# Patient Record
Sex: Male | Born: 1935 | Race: White | Hispanic: No | Smoking: Never smoker
Health system: Southern US, Community
[De-identification: ages and names within clinical notes are randomized; demographics above are authoritative.]

## PROBLEM LIST (undated history)

## (undated) DIAGNOSIS — C679 Malignant neoplasm of bladder, unspecified: Secondary | ICD-10-CM

## (undated) DIAGNOSIS — N189 Chronic kidney disease, unspecified: Secondary | ICD-10-CM

## (undated) DIAGNOSIS — Z860100 Personal history of colon polyps, unspecified: Secondary | ICD-10-CM

## (undated) DIAGNOSIS — R319 Hematuria, unspecified: Secondary | ICD-10-CM

## (undated) DIAGNOSIS — K219 Gastro-esophageal reflux disease without esophagitis: Secondary | ICD-10-CM

## (undated) DIAGNOSIS — E039 Hypothyroidism, unspecified: Secondary | ICD-10-CM

## (undated) DIAGNOSIS — H9190 Unspecified hearing loss, unspecified ear: Secondary | ICD-10-CM

## (undated) DIAGNOSIS — Z87442 Personal history of urinary calculi: Secondary | ICD-10-CM

## (undated) DIAGNOSIS — R5383 Other fatigue: Secondary | ICD-10-CM

## (undated) DIAGNOSIS — R350 Frequency of micturition: Secondary | ICD-10-CM

## (undated) DIAGNOSIS — Z8719 Personal history of other diseases of the digestive system: Secondary | ICD-10-CM

## (undated) DIAGNOSIS — I10 Essential (primary) hypertension: Secondary | ICD-10-CM

## (undated) DIAGNOSIS — N400 Enlarged prostate without lower urinary tract symptoms: Secondary | ICD-10-CM

## (undated) DIAGNOSIS — R29898 Other symptoms and signs involving the musculoskeletal system: Secondary | ICD-10-CM

## (undated) DIAGNOSIS — N183 Chronic kidney disease, stage 3 unspecified: Secondary | ICD-10-CM

## (undated) DIAGNOSIS — Z973 Presence of spectacles and contact lenses: Secondary | ICD-10-CM

## (undated) DIAGNOSIS — R32 Unspecified urinary incontinence: Secondary | ICD-10-CM

## (undated) DIAGNOSIS — R0600 Dyspnea, unspecified: Secondary | ICD-10-CM

## (undated) DIAGNOSIS — R972 Elevated prostate specific antigen [PSA]: Secondary | ICD-10-CM

## (undated) DIAGNOSIS — D649 Anemia, unspecified: Secondary | ICD-10-CM

## (undated) DIAGNOSIS — Z9889 Other specified postprocedural states: Secondary | ICD-10-CM

## (undated) DIAGNOSIS — R7303 Prediabetes: Secondary | ICD-10-CM

## (undated) DIAGNOSIS — G4730 Sleep apnea, unspecified: Secondary | ICD-10-CM

## (undated) DIAGNOSIS — M1990 Unspecified osteoarthritis, unspecified site: Secondary | ICD-10-CM

## (undated) DIAGNOSIS — E785 Hyperlipidemia, unspecified: Secondary | ICD-10-CM

## (undated) DIAGNOSIS — M7550 Bursitis of unspecified shoulder: Secondary | ICD-10-CM

## (undated) DIAGNOSIS — J45909 Unspecified asthma, uncomplicated: Secondary | ICD-10-CM

## (undated) HISTORY — PX: OTHER SURGICAL HISTORY: SHX169

## (undated) HISTORY — DX: Hypothyroidism, unspecified: E03.9

## (undated) HISTORY — DX: Gastro-esophageal reflux disease without esophagitis: K21.9

## (undated) HISTORY — PX: TRANSURETHRAL RESECTION OF BLADDER TUMOR: SHX2575

## (undated) HISTORY — PX: CATARACT EXTRACTION, BILATERAL: SHX1313

## (undated) HISTORY — DX: Essential (primary) hypertension: I10

## (undated) HISTORY — PX: UPPER GI ENDOSCOPY: SHX6162

## (undated) HISTORY — DX: Hyperlipidemia, unspecified: E78.5

---
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
## 1998-11-23 HISTORY — PX: PROSTATE BIOPSY: SHX241

## 1999-06-12 ENCOUNTER — Other Ambulatory Visit: Admission: RE | Admit: 1999-06-12 | Discharge: 1999-06-12 | Payer: Self-pay | Admitting: Urology

## 2002-01-03 ENCOUNTER — Ambulatory Visit (HOSPITAL_COMMUNITY): Admission: RE | Admit: 2002-01-03 | Discharge: 2002-01-03 | Payer: Self-pay | Admitting: *Deleted

## 2002-01-03 ENCOUNTER — Encounter (INDEPENDENT_AMBULATORY_CARE_PROVIDER_SITE_OTHER): Payer: Self-pay | Admitting: Specialist

## 2003-01-17 ENCOUNTER — Emergency Department (HOSPITAL_COMMUNITY): Admission: EM | Admit: 2003-01-17 | Discharge: 2003-01-18 | Payer: Self-pay

## 2003-01-23 ENCOUNTER — Ambulatory Visit (HOSPITAL_COMMUNITY): Admission: RE | Admit: 2003-01-23 | Discharge: 2003-01-23 | Payer: Self-pay | Admitting: Endocrinology

## 2003-01-23 ENCOUNTER — Encounter: Payer: Self-pay | Admitting: Endocrinology

## 2003-04-06 ENCOUNTER — Encounter (INDEPENDENT_AMBULATORY_CARE_PROVIDER_SITE_OTHER): Payer: Self-pay | Admitting: Specialist

## 2003-04-06 ENCOUNTER — Ambulatory Visit (HOSPITAL_COMMUNITY): Admission: RE | Admit: 2003-04-06 | Discharge: 2003-04-06 | Payer: Self-pay | Admitting: *Deleted

## 2005-03-27 ENCOUNTER — Ambulatory Visit (HOSPITAL_COMMUNITY): Admission: RE | Admit: 2005-03-27 | Discharge: 2005-03-27 | Payer: Self-pay | Admitting: *Deleted

## 2005-03-27 ENCOUNTER — Encounter (INDEPENDENT_AMBULATORY_CARE_PROVIDER_SITE_OTHER): Payer: Self-pay | Admitting: Specialist

## 2007-05-06 ENCOUNTER — Ambulatory Visit (HOSPITAL_COMMUNITY): Admission: RE | Admit: 2007-05-06 | Discharge: 2007-05-06 | Payer: Self-pay | Admitting: *Deleted

## 2007-05-06 ENCOUNTER — Encounter (INDEPENDENT_AMBULATORY_CARE_PROVIDER_SITE_OTHER): Payer: Self-pay | Admitting: *Deleted

## 2009-11-23 HISTORY — PX: TRANSURETHRAL RESECTION OF PROSTATE: SHX73

## 2010-03-17 ENCOUNTER — Encounter (INDEPENDENT_AMBULATORY_CARE_PROVIDER_SITE_OTHER): Payer: Self-pay | Admitting: *Deleted

## 2010-03-19 ENCOUNTER — Ambulatory Visit: Payer: Self-pay | Admitting: Gastroenterology

## 2010-04-02 ENCOUNTER — Ambulatory Visit: Payer: Self-pay | Admitting: Gastroenterology

## 2010-12-23 NOTE — Letter (Signed)
Summary: [facility] Instructions  [facility]  [place], [place] [address]   Phone: [phone]  Fax: [phone]       [name] [name]    1932-08-24    MRN: [id number]        Procedure Day Sudie Grumbling:  Wednesday 04/02/2010     Arrival Time: 9:00 am      Procedure Time: 10:00 am     Location of Procedure:                    _x _  [facility] (4th Floor)                        [place]   Starting 5 days prior to your procedure Friday 5/6 do not eat nuts, seeds, popcorn, corn, beans, peas,  salads, or any raw vegetables.  Do not take any fiber supplements (e.g. Metamucil, Citrucel, and Benefiber).  THE DAY BEFORE YOUR PROCEDURE         DATE: Tuesday 5/10  1.  Drink clear liquids the entire day-NO SOLID FOOD  2.  Do not drink anything colored red or purple.  Avoid juices with pulp.  No orange juice.  3.  Drink at least 64 oz. (8 glasses) of fluid/clear liquids during the day to prevent dehydration and help the prep work efficiently.  CLEAR LIQUIDS INCLUDE: Water Jello Ice Popsicles Tea (sugar ok, no milk/cream) Powdered fruit flavored drinks Coffee (sugar ok, no milk/cream) Gatorade Juice: apple, white grape, white cranberry  Lemonade Clear bullion, consomm, broth Carbonated beverages (any kind) Strained chicken noodle soup Hard Candy                             4.  In the morning, mix first dose of MoviPrep solution:    Empty 1 Pouch A and 1 Pouch B into the disposable container    Add lukewarm drinking water to the top line of the container. Mix to dissolve    Refrigerate (mixed solution should be used within 24 hrs)  5.  Begin drinking the prep at 5:00 p.m. The MoviPrep container is divided by 4 marks.   Every 15 minutes drink the solution down to the next mark (approximately 8 oz) until the full liter is complete.   6.  Follow completed prep with 16 oz of clear liquid of your choice  (Nothing red or purple).  Continue to drink clear liquids until bedtime.  7.  Before going to bed, mix second dose of MoviPrep solution:    Empty 1 Pouch A and 1 Pouch B into the disposable container    Add lukewarm drinking water to the top line of the container. Mix to dissolve    Refrigerate  THE DAY OF YOUR PROCEDURE      DATE: Wednesday 5/11  Beginning at 5:00 a.m. (5 hours before procedure):         1. Every 15 minutes, drink the solution down to the next mark (approx 8 oz) until the full liter is complete.  2. Follow completed prep with 16 oz. of clear liquid of your choice.    3. You may drink clear liquids until 8:00 am (2 HOURS BEFORE PROCEDURE).   MEDICATION INSTRUCTIONS  Unless otherwise instructed, you should take regular prescription medications with a small sip of water   as early as possible the morning of  your procedure.    Additional medication instructions:  Do not take Diovan/HCT day of procedure.         OTHER INSTRUCTIONS  You will need a responsible adult at least 75 years of age to accompany you and drive you home.   This person must remain in the waiting room during your procedure.  Wear loose fitting clothing that is easily removed.  Leave jewelry and other valuables at home.  However, you may wish to bring a book to read or  an iPod/MP3 player to listen to music as you wait for your procedure to start.  Remove all body piercing jewelry and leave at home.  Total time from sign-in until discharge is approximately 2-3 hours.  You should go home directly after your procedure and rest.  You can resume normal activities the  day after your procedure.  The day of your procedure you should not:   Drive   Make legal decisions   Operate machinery   Drink alcohol   Return to work  You will receive specific instructions about eating, activities and medications before you leave.    The above instructions have been reviewed and explained  to me by  Ulice Dash RN  March 19, 2010 11:29 AM    I fully understand and can verbalize these instructions _____________________________ Date _________

## 2010-12-23 NOTE — Procedures (Signed)
Summary: Colonoscopy  Patient: [name] [name] Note: All result statuses are Final unless otherwise noted.  Tests: (1) Colonoscopy (COL)   COL Colonoscopy           [facility]     [place], [place]  [address]           COLONOSCOPY PROCEDURE REPORT           PATIENT:  [name], [name]  MR#:  [id number]     BIRTHDATE:  1932-04-29, 77 yrs. old  GENDER:  male     ENDOSCOPIST:  Milus Banister, MD     REF. BY:  Tommy Medal, M.D.     PROCEDURE DATE:  04/02/2010     PROCEDURE:  Diagnostic Colonoscopy     ASA CLASS:  Class II     INDICATIONS:  previous TA removed by Dr. Lajoyce Corners in 2008, was     recommended to have repeat colonoscopy around now     MEDICATIONS:  Fentanyl 75 mcg IV, Versed 6 mg IV           DESCRIPTION OF PROCEDURE:   After the risks benefits and     alternatives of the procedure were thoroughly explained, informed     consent was obtained.  Digital rectal exam was performed and     revealed no rectal masses.   The LB CF-H180AL [id number] endoscope     was introduced through the anus and advanced to the cecum, which     was identified by both the appendix and ileocecal valve, without     limitations.  The quality of the prep was good, using MoviPrep.     The instrument was then slowly withdrawn as the colon was fully     examined.     <<PROCEDUREIMAGES>>           FINDINGS:  Mild diverticulosis was found in the sigmoid to     descending colon segments (see image3).  This was otherwise a     normal examination of the colon (see image4, image2, and image1).     Retroflexed views in the rectum revealed no abnormalities.    The     scope was then withdrawn from the patient and the procedure     completed.           COMPLICATIONS:  None     ENDOSCOPIC IMPRESSION:     1) Mild diverticulosis in the sigmoid to descending colon     segments     2) Otherwise normal examination; no polyps or cancers           RECOMMENDATIONS:     1)  Given your personal history of pre-cancerous polyps, you     should have a repeat colonoscopy in 5 years           REPEAT EXAM:  5 years           ______________________________     Milus Banister, MD           n.     eSIGNED:   Milus Banister at 04/02/2010 10:12 AM           [place], [place], [id number]  Note: An exclamation mark (!) indicates a result that was not dispersed into the flowsheet. Document Creation Date: 04/02/2010 10:12 AM _______________________________________________________________________  (1) Order result status: Final Collection or observation date-time: 04/02/2010 10:06 Requested date-time:  Receipt date-time:  Reported date-time:  Referring Physician:   Ordering Physician: Owens Loffler [phone]) Specimen Source:  Source: Tawanna Cooler Order Number: [id number] Lab site:   Appended Document: Colonoscopy    Clinical Lists Changes  Observations: Added new observation of COLONNXTDUE: 03/2015 (04/02/2010 14:41)

## 2010-12-23 NOTE — Miscellaneous (Signed)
Summary: [facility]  Clinical Lists Changes  Medications: Added new medication of MOVIPREP 100 GM  SOLR (PEG-KCL-NACL-NASULF-NA ASC-C) As per prep instructions. - Signed Rx of MOVIPREP 100 GM  SOLR (PEG-KCL-NACL-NASULF-NA ASC-C) As per prep instructions.;  #1 x 0;  Signed;  Entered by: Ulice Dash RN;  Authorized by: Milus Banister MD;  Method used: Electronically to [facility]*, [address], [place], [place], [place]  [address], Ph: [phone], Fax: [phone] Allergies: Added new allergy or adverse reaction of CIPRO Observations: Added new observation of NKA: F (03/19/2010 10:49)    Prescriptions: MOVIPREP 100 GM  SOLR (PEG-KCL-NACL-NASULF-NA ASC-C) As per prep instructions.  #1 x 0   Entered by:   Ulice Dash RN   Authorized by:   Milus Banister MD   Signed by:   Ulice Dash RN on 03/19/2010   Method used:   Electronically to        [facility]* (retail)       [address] [place], [place]  [address]       Ph: [phone]       Fax: [phone]   RxID:   [id number]

## 2011-04-07 NOTE — Op Note (Signed)
NAMEARRIE, [name] [name] NO.:  [id number]   MEDICAL RECORD NO.:  [id number]          PATIENT TYPE:  AMB   LOCATION:  ENDO                         FACILITY:  [facility]   PHYSICIAN:  Waverly Ferrari, M.D.    DATE OF BIRTH:  August 15, 1932   DATE OF PROCEDURE:  05/06/2007  DATE OF DISCHARGE:                               OPERATIVE REPORT   PROCEDURE:  Colonoscopy.   INDICATIONS:  Colon polyp.   ANESTHESIA:  Fentanyl 40 mcg, Versed 4 mg.   DESCRIPTION OF PROCEDURE:  With the patient mildly sedated in the left  lateral decubitus position, the Pentax videoscopic colonoscope was  inserted in the rectum and passed under direct vision. With pressure  applied, we reached the cecum identified by the ileocecal valve and  appendiceal orifice, both of which were photographed. From this point,  the colonoscope was slowly withdrawn taking circumferential views of the  colonic mucosa stopping two folds removed from the ileocecal valve at  which point we encountered a small polyp that was removed using hot  biopsy forceps technique, setting 20/200 blended current.  We next  stopped in the rectum which appeared normal on direct and retroflexed  view. The endoscope was straightened and withdrawn.  The patient's vital  signs and pulse oximeter remained stable.  The patient tolerated the  procedure well without apparent complications.   FINDINGS:  Scattered diverticula seen throughout the colon.  A small  polyp of ascending colon removed. Otherwise, an unremarkable exam.   PLAN:  Await biopsy report.  The patient will call me for results and  follow-up with me as needed as an outpatient.           ______________________________  Waverly Ferrari, M.D.     GMO/MEDQ  D:  05/06/2007  T:  05/06/2007  Job:  [id number]

## 2011-04-07 NOTE — Op Note (Signed)
NAMEMARLEN, [name] [name] NO.:  [id number]   MEDICAL RECORD NO.:  [id number]          PATIENT TYPE:  AMB   LOCATION:  ENDO                         FACILITY:  [facility]   PHYSICIAN:  Waverly Ferrari, M.D.    DATE OF BIRTH:  August 30, 1932   DATE OF PROCEDURE:  05/06/2007  DATE OF DISCHARGE:                               OPERATIVE REPORT   PROCEDURE:  Upper endoscopy.   INDICATIONS:  Gastroesophageal reflux disease.   ANESTHESIA:  Fentanyl 60 mcg, Versed 6 mg.   DESCRIPTION OF PROCEDURE:  With the patient mildly sedated in the left  lateral decubitus position, the Pentax videoscopic endoscope was  inserted in the mouth and passed under direct vision through the  esophagus which appeared normal.  There was no evidence of Barrett's  esophagus.  A small hiatal hernia was noted.  We did a biopsy around the  squamocolumnar junction to rule out esophagitis.  We entered into the  stomach.  The fundus, body, antrum, duodenal bulb, and second portion of  the duodenum appeared normal. From this point, the endoscope was slowly  withdrawn taking circumferential views of the duodenal mucosa until the  endoscope had been pulled back in the stomach and placed in retroflexion  to view the stomach from below. The endoscope was straightened and  withdrawn taking circumferential views of the remaining gastric and  esophageal mucosa.  The patient's vital signs and pulse oximeter  remained stable.  The patient tolerated the procedure well without  apparent complications.   FINDINGS:  Loose wrap of the GE junction around the endoscope indicating  laxity of the lower esophageal sphincter and a small hiatal hernia was  noted, as well. Otherwise, unremarkable exam.   PLAN:  Await biopsy report.  The patient will call me for results and  follow-up with me as an outpatient as needed.           ______________________________  Waverly Ferrari, M.D.     GMO/MEDQ  D:  05/06/2007  T:  05/06/2007   Job:  [id number]

## 2011-04-10 NOTE — Procedures (Signed)
[place]  Patient:    LECIL, MOFFA Visit Number: [id number] MRN: [id number]          Service Type: END Location: ENDO Attending Physician:  Jim Desanctis Dictated by:   Jim Desanctis, M.D. Admit Date:  01/03/2002                             Procedure Report  PROCEDURE:  Colonoscopy.  INDICATION:  Colon cancer screening.  ANESTHESIA:  Demerol 50 mg, Versed 5 mg.  DESCRIPTION OF PROCEDURE:  With patient mildly sedated in the left lateral decubitus position, the Olympus videoscopic colonoscope was inserted into the rectum after a normal rectal exam and passed under direct vision to the cecum. Cecum identified by the ileocecal valve and appendiceal orifice, both of which were photographed.  From this point the colonoscope was slowly withdrawn, taking circumferential views of the entire colonic mucosa, stopping only in the rectum, which appeared normal on retroflex view.  The endoscope was straightened and withdrawn.  The patients vital signs and pulse oximetry remained stable.  The patient tolerated the procedure well without apparent complications.  FINDINGS:  Essentially normal colonoscopic examination.  PLAN:  See endoscopy note for further details. Dictated by:   Jim Desanctis, M.D. Attending Physician:  Jim Desanctis DD:  01/03/02 TD:  01/03/02 Job: [id number]

## 2011-04-10 NOTE — Op Note (Signed)
   [name] [name], [name] [name]                       ACCOUNT NO.:  [id number]   MEDICAL RECORD NO.:  [id number]                   PATIENT TYPE:  AMB   LOCATION:  ENDO                                 FACILITY:  [facility]   PHYSICIAN:  Waverly Ferrari, M.D.                 DATE OF BIRTH:  06/19/1932   DATE OF PROCEDURE:  04/06/2003  DATE OF DISCHARGE:                                 OPERATIVE REPORT   PROCEDURE:  Upper endoscopy with biopsy and dilation.   INDICATIONS:  Endoscopic Barrett's esophagus with dysphagia.   ANESTHESIA:  1. Demerol 60 mg.  2. Versed 5 mg.   DESCRIPTION OF PROCEDURE:  With patient mildly sedated in the left lateral  decubitus position, the Olympus videoscopic endoscope was inserted in the  mouth, passed under direct vision through the esophagus, which appeared  normal until the distal esophagus, and there was a question of a stricture  and also areas that appeared to be compatible with Barrett's esophagus.  Photographs were taken.  Biopsies were taken of the Barrett's areas.  We  advanced the endoscope into the stomach.  Fundus, body, antrum, duodenal  bulb, and second portion of duodenum appeared normal.  From this point, the  endoscope was slowly withdrawn, taking circumferential views of the duodenal  mucosa until the endoscope then pulled back into the stomach, placed in  retroflexion to view the stomach from below, and a hiatal hernia was seen  and photographed.  The endoscope was then straightened, and a guidewire was  passed.  The endoscope was withdrawn.  Subsequently, dilators 15 and 18 were  passed over the guidewire easily.  There was trace blood on each dilator,  but biopsy had already been taken.  With the latter, the guidewire was  removed, endoscope reinserted.  There was a minimal amount of bleeding seen  but nothing active in the esophagus and none in the stomach.  The endoscope  was withdrawn.  The patient's vital signs and pulse oximetry remained  stable.  The patient tolerated the procedure well without apparent  complications.   FINDINGS:  1. Changes of Barrett's esophagus, biopsied.  2. Hiatal hernia.  3. Esophageal dilation to 15 and 18 Savary.   PLAN:  1. Await clinical response.  2. The patient will call me for results of biopsies and follow up with me as     an outpatient.                                               Waverly Ferrari, M.D.    GMO/MEDQ  D:  04/06/2003  T:  04/06/2003  Job:  [id number]

## 2011-04-10 NOTE — Procedures (Signed)
. [facility]  Patient:    [name] [name], [name] [name] Visit Number: [id number] MRN: [id number]          Service Type: END Location: ENDO Attending Physician:  Jim Desanctis Dictated by:   Jim Desanctis, M.D. Proc. Date: 01/03/02 Admit Date:  01/03/2002                             Procedure Report  PROCEDURE PERFORMED:  Upper endoscopy.  ENDOSCOPIST:  Jim Desanctis, M.D.  INDICATIONS FOR PROCEDURE:  Gastroesophageal reflux disease.  ANESTHESIA:  Demerol 70 mg, Versed 7 mg  DESCRIPTION OF PROCEDURE:  With the patient mildly sedated in the left lateral decubitus position, the Olympus video endoscope was inserted in the mouth and passed under direct vision through the esophagus.  We photographed the distal esophagus.  We entered into the stomach.  The fundus, body, antrum, duodenal bulb and second portion of the duodenum were all well-visualized.  Photographs were taken.  From this point, the endoscope was slowly withdrawn taking circumferential views of the entire duodenal mucosa until the endoscope had been pulled back into the stomach and placed on retroflexion to view the stomach from below and a hiatal hernia was seen from below and photographed. The endoscope was then straightened and withdrawn taking circumferential views of the remaining gastric mucosa  stopping in the distal esophagus where on second view of this area it appeared that there may have been an area of Barretts esophagus which we photographed and then biopsied.  Once accomplished the endoscope was withdrawn taking circumferential views of the remaining esophageal mucosa.  Patients vital signs and pulse oximeter remained stable.  The patient tolerated the procedure well without apparent complications.  FINDINGS:  Hiatal hernia with questionable Barretts esophagus, biopsied, await biopsy report.  Patient will call me for results and follow up with me as an outpatient.  Proceed to colonoscopy  as planned.  PLAN: Dictated by:   Jim Desanctis, M.D. Attending Physician:  Jim Desanctis DD:  01/03/02 TD:  01/03/02 Job: [id number]

## 2011-04-10 NOTE — Op Note (Signed)
NAMEBRANDOM, SCIARRINO NO.:  [id number]   MEDICAL RECORD NO.:  [id number]          PATIENT TYPE:  AMB   LOCATION:  ENDO                         FACILITY:  [facility]   PHYSICIAN:  Waverly Ferrari, M.D.    DATE OF BIRTH:  June 02, 1932   DATE OF PROCEDURE:  03/27/2005  DATE OF DISCHARGE:                                 OPERATIVE REPORT   PROCEDURE:  Upper endoscopy with biopsy.   INDICATIONS:  Endoscopic Barrett's esophagus with gastroesophageal reflux  disease.   ANESTHESIA:  Demerol 50, Versed 7 mg.   PROCEDURE:  With the patient mildly sedated in the left lateral decubitus  position,  the Olympus videoscopic endoscope was inserted in the mouth and  passed under direct vision through the esophagus which appeared normal until  we reached the distal esophagus and there was a question of some areas of  Barrett's extending cephalad into the esophagus which were photographed and  biopsied in these areas. We then entered into the stomach. The fundus, body,  antrum, duodenal bulb, and second portion duodenum were visualized. From  this point, the endoscope was slowly withdrawn taking circumferential views  of the duodenal mucosa until the endoscope had been pulled back into the  stomach, placed in retroflexion to view the stomach from below. The  endoscope was then straightened and withdrawn taking circumferential views  of the remaining gastric and esophageal mucosa stopping to biopsy the polyp  seen in the fundus. The patient's vital signs and pulse oximeter remained  stable. The patient tolerated procedure well without apparent complications.   FINDINGS:  Fundic polyps biopsied, question of Barrett's biopsied, await  biopsy reports. The patient will call me for results and follow-up with me  as an outpatient.      GMO/MEDQ  D:  03/27/2005  T:  03/27/2005  Job:  [id number]

## 2011-05-01 ENCOUNTER — Other Ambulatory Visit: Payer: Self-pay | Admitting: Internal Medicine

## 2011-05-01 DIAGNOSIS — M25562 Pain in left knee: Secondary | ICD-10-CM

## 2011-05-02 ENCOUNTER — Ambulatory Visit
Admission: RE | Admit: 2011-05-02 | Discharge: 2011-05-02 | Disposition: A | Discharge status: Home / Self Care | Payer: Medicare Other | Source: Ambulatory Visit | Attending: Internal Medicine | Admitting: Internal Medicine

## 2011-05-02 DIAGNOSIS — M25562 Pain in left knee: Secondary | ICD-10-CM

## 2011-06-10 HISTORY — PX: KNEE ARTHROSCOPY: SUR90

## 2011-06-12 ENCOUNTER — Emergency Department (HOSPITAL_COMMUNITY)
Admission: EM | Admit: 2011-06-12 | Discharge: 2011-06-13 | Disposition: A | Discharge status: Home / Self Care | Payer: Medicare Other | Attending: Emergency Medicine | Admitting: Emergency Medicine

## 2011-06-12 ENCOUNTER — Emergency Department (HOSPITAL_COMMUNITY): Payer: Medicare Other

## 2011-06-12 DIAGNOSIS — I10 Essential (primary) hypertension: Secondary | ICD-10-CM | POA: Insufficient documentation

## 2011-06-12 DIAGNOSIS — H547 Unspecified visual loss: Secondary | ICD-10-CM | POA: Insufficient documentation

## 2011-06-12 DIAGNOSIS — E7800 Pure hypercholesterolemia, unspecified: Secondary | ICD-10-CM | POA: Insufficient documentation

## 2011-06-12 LAB — CBC
HCT: 38.2 % — ABNORMAL LOW (ref 39.0–52.0)
Hemoglobin: 13.3 g/dL (ref 13.0–17.0)
MCH: 30.4 pg (ref 26.0–34.0)
MCHC: 34.8 g/dL (ref 30.0–36.0)
MCV: 87.4 fL (ref 78.0–100.0)
Platelets: 228 10*3/uL (ref 150–400)
RBC: 4.37 MIL/uL (ref 4.22–5.81)
RDW: 13.2 % (ref 11.5–15.5)
WBC: 9.5 10*3/uL (ref 4.0–10.5)

## 2011-06-12 LAB — BASIC METABOLIC PANEL
BUN: 31 mg/dL — ABNORMAL HIGH (ref 6–23)
CO2: 26 mEq/L (ref 19–32)
Calcium: 8.8 mg/dL (ref 8.4–10.5)
Chloride: 101 mEq/L (ref 96–112)
Creatinine, Ser: 1.67 mg/dL — ABNORMAL HIGH (ref 0.50–1.35)
GFR calc Af Amer: 49 mL/min — ABNORMAL LOW (ref >60)
GFR calc non Af Amer: 40 mL/min — ABNORMAL LOW (ref >60)
Glucose, Bld: 100 mg/dL — ABNORMAL HIGH (ref 70–99)
Potassium: 4.1 mEq/L (ref 3.5–5.1)
Sodium: 137 mEq/L (ref 135–145)

## 2011-06-12 LAB — DIFFERENTIAL
Basophils Absolute: 0 10*3/uL (ref 0.0–0.1)
Basophils Relative: 0 % (ref 0–1)
Eosinophils Absolute: 0.2 10*3/uL (ref 0.0–0.7)
Eosinophils Relative: 2 % (ref 0–5)
Lymphocytes Relative: 15 % (ref 12–46)
Lymphs Abs: 1.4 10*3/uL (ref 0.7–4.0)
Monocytes Absolute: 0.6 10*3/uL (ref 0.1–1.0)
Monocytes Relative: 6 % (ref 3–12)
Neutro Abs: 7.4 10*3/uL (ref 1.7–7.7)
Neutrophils Relative %: 77 % (ref 43–77)

## 2011-06-12 LAB — PROTIME-INR
INR: 1.02 (ref 0.00–1.49)
Prothrombin Time: 13.6 seconds (ref 11.6–15.2)

## 2012-10-12 ENCOUNTER — Other Ambulatory Visit: Payer: Self-pay | Admitting: Internal Medicine

## 2012-10-12 DIAGNOSIS — N289 Disorder of kidney and ureter, unspecified: Secondary | ICD-10-CM

## 2012-10-14 ENCOUNTER — Ambulatory Visit
Admission: RE | Admit: 2012-10-14 | Discharge: 2012-10-14 | Disposition: A | Discharge status: Home / Self Care | Payer: Medicare Other | Source: Ambulatory Visit | Attending: Internal Medicine | Admitting: Internal Medicine

## 2012-10-14 DIAGNOSIS — N289 Disorder of kidney and ureter, unspecified: Secondary | ICD-10-CM

## 2012-11-28 DIAGNOSIS — K219 Gastro-esophageal reflux disease without esophagitis: Secondary | ICD-10-CM | POA: Diagnosis present

## 2012-12-09 DIAGNOSIS — N400 Enlarged prostate without lower urinary tract symptoms: Secondary | ICD-10-CM | POA: Insufficient documentation

## 2014-03-01 ENCOUNTER — Ambulatory Visit
Admission: RE | Admit: 2014-03-01 | Discharge: 2014-03-01 | Disposition: A | Discharge status: Home / Self Care | Payer: Medicare Other | Source: Ambulatory Visit | Attending: Internal Medicine | Admitting: Internal Medicine

## 2014-03-01 ENCOUNTER — Other Ambulatory Visit: Payer: Self-pay | Admitting: Internal Medicine

## 2014-03-01 DIAGNOSIS — M545 Low back pain, unspecified: Secondary | ICD-10-CM

## 2014-07-25 ENCOUNTER — Encounter: Payer: Self-pay | Admitting: Gastroenterology

## 2014-12-26 DIAGNOSIS — M7551 Bursitis of right shoulder: Secondary | ICD-10-CM | POA: Diagnosis not present

## 2014-12-26 DIAGNOSIS — M19011 Primary osteoarthritis, right shoulder: Secondary | ICD-10-CM | POA: Diagnosis not present

## 2015-01-02 DIAGNOSIS — M25511 Pain in right shoulder: Secondary | ICD-10-CM | POA: Diagnosis not present

## 2015-01-08 DIAGNOSIS — M25511 Pain in right shoulder: Secondary | ICD-10-CM | POA: Diagnosis not present

## 2015-01-10 DIAGNOSIS — M25511 Pain in right shoulder: Secondary | ICD-10-CM | POA: Diagnosis not present

## 2015-01-15 DIAGNOSIS — M25511 Pain in right shoulder: Secondary | ICD-10-CM | POA: Diagnosis not present

## 2015-01-17 DIAGNOSIS — M25511 Pain in right shoulder: Secondary | ICD-10-CM | POA: Diagnosis not present

## 2015-01-22 DIAGNOSIS — M25511 Pain in right shoulder: Secondary | ICD-10-CM | POA: Diagnosis not present

## 2015-01-24 DIAGNOSIS — M25511 Pain in right shoulder: Secondary | ICD-10-CM | POA: Diagnosis not present

## 2015-01-29 DIAGNOSIS — M25511 Pain in right shoulder: Secondary | ICD-10-CM | POA: Diagnosis not present

## 2015-01-30 DIAGNOSIS — I10 Essential (primary) hypertension: Secondary | ICD-10-CM | POA: Diagnosis not present

## 2015-01-30 DIAGNOSIS — R7309 Other abnormal glucose: Secondary | ICD-10-CM | POA: Diagnosis not present

## 2015-01-31 DIAGNOSIS — M25511 Pain in right shoulder: Secondary | ICD-10-CM | POA: Diagnosis not present

## 2015-02-04 DIAGNOSIS — E039 Hypothyroidism, unspecified: Secondary | ICD-10-CM | POA: Diagnosis not present

## 2015-02-04 DIAGNOSIS — N189 Chronic kidney disease, unspecified: Secondary | ICD-10-CM | POA: Diagnosis not present

## 2015-02-04 DIAGNOSIS — R7309 Other abnormal glucose: Secondary | ICD-10-CM | POA: Diagnosis not present

## 2015-02-04 DIAGNOSIS — E780 Pure hypercholesterolemia: Secondary | ICD-10-CM | POA: Diagnosis not present

## 2015-02-06 DIAGNOSIS — M25511 Pain in right shoulder: Secondary | ICD-10-CM | POA: Diagnosis not present

## 2015-02-08 DIAGNOSIS — M67911 Unspecified disorder of synovium and tendon, right shoulder: Secondary | ICD-10-CM | POA: Diagnosis not present

## 2015-02-26 DIAGNOSIS — R300 Dysuria: Secondary | ICD-10-CM | POA: Diagnosis not present

## 2015-03-04 DIAGNOSIS — H6123 Impacted cerumen, bilateral: Secondary | ICD-10-CM | POA: Diagnosis not present

## 2015-03-08 DIAGNOSIS — M25511 Pain in right shoulder: Secondary | ICD-10-CM | POA: Diagnosis not present

## 2015-03-08 DIAGNOSIS — M67911 Unspecified disorder of synovium and tendon, right shoulder: Secondary | ICD-10-CM | POA: Diagnosis not present

## 2015-04-15 ENCOUNTER — Encounter: Payer: Self-pay | Admitting: Gastroenterology

## 2015-04-19 DIAGNOSIS — M67911 Unspecified disorder of synovium and tendon, right shoulder: Secondary | ICD-10-CM | POA: Diagnosis not present

## 2015-05-28 DIAGNOSIS — E780 Pure hypercholesterolemia: Secondary | ICD-10-CM | POA: Diagnosis not present

## 2015-05-28 DIAGNOSIS — R7309 Other abnormal glucose: Secondary | ICD-10-CM | POA: Diagnosis not present

## 2015-05-28 DIAGNOSIS — N189 Chronic kidney disease, unspecified: Secondary | ICD-10-CM | POA: Diagnosis not present

## 2015-05-28 DIAGNOSIS — E039 Hypothyroidism, unspecified: Secondary | ICD-10-CM | POA: Diagnosis not present

## 2015-06-03 DIAGNOSIS — I10 Essential (primary) hypertension: Secondary | ICD-10-CM | POA: Diagnosis not present

## 2015-06-03 DIAGNOSIS — N3941 Urge incontinence: Secondary | ICD-10-CM | POA: Diagnosis not present

## 2015-06-03 DIAGNOSIS — E780 Pure hypercholesterolemia: Secondary | ICD-10-CM | POA: Diagnosis not present

## 2015-06-03 DIAGNOSIS — E039 Hypothyroidism, unspecified: Secondary | ICD-10-CM | POA: Diagnosis not present

## 2015-06-06 DIAGNOSIS — D1801 Hemangioma of skin and subcutaneous tissue: Secondary | ICD-10-CM | POA: Diagnosis not present

## 2015-06-06 DIAGNOSIS — L821 Other seborrheic keratosis: Secondary | ICD-10-CM | POA: Diagnosis not present

## 2015-06-06 DIAGNOSIS — L570 Actinic keratosis: Secondary | ICD-10-CM | POA: Diagnosis not present

## 2015-06-06 DIAGNOSIS — D225 Melanocytic nevi of trunk: Secondary | ICD-10-CM | POA: Diagnosis not present

## 2015-09-27 DIAGNOSIS — H2513 Age-related nuclear cataract, bilateral: Secondary | ICD-10-CM | POA: Diagnosis not present

## 2015-09-27 DIAGNOSIS — H524 Presbyopia: Secondary | ICD-10-CM | POA: Diagnosis not present

## 2015-09-27 DIAGNOSIS — H04123 Dry eye syndrome of bilateral lacrimal glands: Secondary | ICD-10-CM | POA: Diagnosis not present

## 2015-09-27 DIAGNOSIS — H25013 Cortical age-related cataract, bilateral: Secondary | ICD-10-CM | POA: Diagnosis not present

## 2015-10-02 DIAGNOSIS — N390 Urinary tract infection, site not specified: Secondary | ICD-10-CM | POA: Diagnosis not present

## 2015-10-04 DIAGNOSIS — M25551 Pain in right hip: Secondary | ICD-10-CM | POA: Diagnosis not present

## 2015-10-04 DIAGNOSIS — R350 Frequency of micturition: Secondary | ICD-10-CM | POA: Diagnosis not present

## 2015-10-04 DIAGNOSIS — Z23 Encounter for immunization: Secondary | ICD-10-CM | POA: Diagnosis not present

## 2015-10-04 DIAGNOSIS — M1990 Unspecified osteoarthritis, unspecified site: Secondary | ICD-10-CM | POA: Diagnosis not present

## 2015-10-04 DIAGNOSIS — N400 Enlarged prostate without lower urinary tract symptoms: Secondary | ICD-10-CM | POA: Diagnosis not present

## 2015-10-21 DIAGNOSIS — R7309 Other abnormal glucose: Secondary | ICD-10-CM | POA: Diagnosis not present

## 2015-10-21 DIAGNOSIS — I10 Essential (primary) hypertension: Secondary | ICD-10-CM | POA: Diagnosis not present

## 2015-10-21 DIAGNOSIS — E039 Hypothyroidism, unspecified: Secondary | ICD-10-CM | POA: Diagnosis not present

## 2015-10-21 DIAGNOSIS — N400 Enlarged prostate without lower urinary tract symptoms: Secondary | ICD-10-CM | POA: Diagnosis not present

## 2015-10-21 DIAGNOSIS — E7800 Pure hypercholesterolemia, unspecified: Secondary | ICD-10-CM | POA: Diagnosis not present

## 2015-10-28 DIAGNOSIS — E7800 Pure hypercholesterolemia, unspecified: Secondary | ICD-10-CM | POA: Diagnosis not present

## 2015-10-28 DIAGNOSIS — N183 Chronic kidney disease, stage 3 (moderate): Secondary | ICD-10-CM | POA: Diagnosis not present

## 2015-10-28 DIAGNOSIS — E039 Hypothyroidism, unspecified: Secondary | ICD-10-CM | POA: Diagnosis not present

## 2015-10-28 DIAGNOSIS — I10 Essential (primary) hypertension: Secondary | ICD-10-CM | POA: Diagnosis not present

## 2015-10-29 ENCOUNTER — Other Ambulatory Visit: Payer: Self-pay | Admitting: Internal Medicine

## 2015-10-29 DIAGNOSIS — R1310 Dysphagia, unspecified: Secondary | ICD-10-CM

## 2015-11-04 ENCOUNTER — Ambulatory Visit
Admission: RE | Admit: 2015-11-04 | Discharge: 2015-11-04 | Disposition: A | Discharge status: Home / Self Care | Payer: Self-pay | Source: Ambulatory Visit | Attending: Internal Medicine | Admitting: Internal Medicine

## 2015-11-04 DIAGNOSIS — R1310 Dysphagia, unspecified: Secondary | ICD-10-CM

## 2015-12-05 DIAGNOSIS — T148 Other injury of unspecified body region: Secondary | ICD-10-CM | POA: Diagnosis not present

## 2016-01-10 ENCOUNTER — Encounter: Payer: Self-pay | Admitting: Gastroenterology

## 2016-01-10 ENCOUNTER — Ambulatory Visit (INDEPENDENT_AMBULATORY_CARE_PROVIDER_SITE_OTHER): Payer: Medicare Other | Admitting: Gastroenterology

## 2016-01-10 VITALS — BP 144/80 | HR 88 | Ht 68.0 in | Wt 194.5 lb

## 2016-01-10 DIAGNOSIS — R1310 Dysphagia, unspecified: Secondary | ICD-10-CM | POA: Diagnosis not present

## 2016-01-10 NOTE — Patient Instructions (Addendum)
You have never been proven to have Barrett's (based on Dr. Shan Levans EGD pathology reports from 2003, 2004, 2006, 2008). Please call if you have any new, concerning GI symptoms

## 2016-01-10 NOTE — Progress Notes (Signed)
Review of pertinent gastrointestinal problems: 1. Precancerous colon polyp: Dr. Jim Desanctis colonoscopy 2008 "small" on that report, was TA. Repeat colonoscopy Dr. Ardis Hughs 2011 found no polyps, initially recommended repeat at 5 years however newer guidelines showed that to be unnecessary.   2. He does NOT have Barrett's, not sure where that history came from:  EGD Dr. Jim Desanctis 2008 specifically stated "no Barrett's noted".  Pathology from 2006, 2004, 2003 EGDs from Dr. Lajoyce Corners all showed "no intestinal metaplasia"  On at least one of those procedure notes Dr. Lajoyce Corners wrote "questionable barrett's mucosa.'  However pathology repeatedly has never proven Barrett's changes.   HPI: This is a   very pleasant 82 year old man    who was referred to me by Dr. Ashby Dawes to evaluate  pill dysphasia, question of Barrett's .    Chief complaint is pill dysphagia and I have Barrett's  He thought he was having difficulty swallowing.  Pill gets lodged, always the same small pill for thyroid.  Has voice changes intermittently. Having a lot of throat clearing, can be at night.  Never solid food dysphagia.  He's lost appetite since death of his wife, 07/12/15, 10 pounds down.  He's been on protonix for many years.    He has no pyrosis.  He has never actually been proven to have Barrett's esophagus, see the summary above  Review of systems: Pertinent positive and negative review of systems were noted in the above HPI section. Complete review of systems was performed and was otherwise normal.   Past Medical History  Diagnosis Date  . Enlarged prostate   . GERD (gastroesophageal reflux disease)   . Hypothyroidism   . HLD (hyperlipidemia)   . HTN (hypertension)   . Colon polyp     Past Surgical History  Procedure Laterality Date  . Prostate biopsy  2000  . Terp  2011, 2014    x 2  . Knee arthroscopy Left 2012  . Bladder surgery      for skin cancer    Current Outpatient Prescriptions  Medication Sig  Dispense Refill  . Ascorbic Acid (VITAMIN C) 1000 MG tablet Take 1,000 mg by mouth daily.    Marland Kitchen aspirin 81 MG tablet Take 81 mg by mouth daily.    . beta carotene 25000 UNIT capsule Take 25,000 Units by mouth daily.    Marland Kitchen BIOTIN PO Take 2,000 mcg by mouth daily.    . Cholecalciferol (HM VITAMIN D3) 4000 units CAPS Take 1 capsule by mouth daily.    . Chromium Picolinate 200 MCG CAPS Take 1 capsule by mouth daily.    . finasteride (PROSCAR) 5 MG tablet Take 1 tablet by mouth daily.    . Ginkgo Biloba (GINKOBA PO) Take 120 mg by mouth daily.    . irbesartan (AVAPRO) 300 MG tablet Take 1 tablet by mouth daily.    Marland Kitchen levothyroxine (SYNTHROID, LEVOTHROID) 50 MCG tablet Take 1 tablet by mouth daily.    . meloxicam (MOBIC) 7.5 MG tablet Take 1 tablet by mouth daily.    . Omega-3 Fatty Acids (OMEGA-3 FISH OIL PO) Take 1 tablet by mouth daily.    . pantoprazole (PROTONIX) 40 MG tablet Take 1 tablet by mouth daily.    . PSYLLIUM PO Take by mouth. 1.5 teaspoon twice a day    . rosuvastatin (CRESTOR) 20 MG tablet Take 5 mg by mouth daily.     . vitamin E 400 UNIT capsule Take 400 Units by mouth daily.  No current facility-administered medications for this visit.    Allergies as of 01/10/2016 - Review Complete 01/10/2016  Allergen Reaction Noted  . Ciprofloxacin  03/19/2010    Family History  Problem Relation Age of Onset  . Hypertension Mother   . Breast cancer Mother   . Other Mother     brain tumor  . Alzheimer's disease Mother   . Emphysema Father     smoker    Social History   Social History  . Marital Status: Married    Spouse Name: N/A  . Number of Children: 3  . Years of Education: N/A   Occupational History  . retired    Social History Main Topics  . Smoking status: Never Smoker   . Smokeless tobacco: Never Used  . Alcohol Use: 0.0 oz/week    0 Standard drinks or equivalent per week     Comment: occa beer or wine  . Drug Use: No  . Sexual Activity: Not on file    Other Topics Concern  . Not on file   Social History Narrative     Physical Exam: BP 144/80 mmHg  Pulse 88  Ht 5\' 8"  (1.727 m)  Wt 194 lb 8 oz (88.225 kg)  BMI 29.58 kg/m2 Constitutional: generally well-appearing Psychiatric: alert and oriented x3 Eyes: extraocular movements intact Mouth: oral pharynx moist, no lesions Neck: supple no lymphadenopathy Cardiovascular: heart regular rate and rhythm Lungs: clear to auscultation bilaterally Abdomen: soft, nontender, nondistended, no obvious ascites, no peritoneal signs, normal bowel sounds Extremities: no lower extremity edema bilaterally Skin: no lesions on visible extremities   Assessment and plan: 82 y.o. male with  pill only associated dysphagia, GERD, he has never had Barrett's esophagus however  He has been labeled as having Barrett's esophagus incorrectly. Multiple biopsies by previous GI provider never proved he had intestinal metaplasia. I explained this to him and he does seem to understand now that he has never had Barrett's esophagus. His dysphagia currently is pill associated only and it is actually only one very small thyroid pill. I asked him to start taking a small scoop of yogurt or applesauce with this pill to see because down easier. He will call if he is any further significant GI concerns or questions.  He does not have any food associated dysphagia and recent barium esophagram showed no esophageal strictures or stenosis. I do not think he needs upper endoscopy.    Owens Loffler, MD [facility] 01/10/2016, 2:44 PM  Cc: Dr. Ashby Dawes

## 2016-06-01 DIAGNOSIS — I10 Essential (primary) hypertension: Secondary | ICD-10-CM | POA: Diagnosis not present

## 2016-06-01 DIAGNOSIS — E7800 Pure hypercholesterolemia, unspecified: Secondary | ICD-10-CM | POA: Diagnosis not present

## 2016-06-01 DIAGNOSIS — E039 Hypothyroidism, unspecified: Secondary | ICD-10-CM | POA: Diagnosis not present

## 2016-06-01 DIAGNOSIS — N183 Chronic kidney disease, stage 3 (moderate): Secondary | ICD-10-CM | POA: Diagnosis not present

## 2016-06-08 DIAGNOSIS — R7309 Other abnormal glucose: Secondary | ICD-10-CM | POA: Diagnosis not present

## 2016-06-08 DIAGNOSIS — E039 Hypothyroidism, unspecified: Secondary | ICD-10-CM | POA: Diagnosis not present

## 2016-06-08 DIAGNOSIS — E7800 Pure hypercholesterolemia, unspecified: Secondary | ICD-10-CM | POA: Diagnosis not present

## 2016-06-08 DIAGNOSIS — M25551 Pain in right hip: Secondary | ICD-10-CM | POA: Diagnosis not present

## 2016-06-23 DIAGNOSIS — D485 Neoplasm of uncertain behavior of skin: Secondary | ICD-10-CM | POA: Diagnosis not present

## 2016-06-23 DIAGNOSIS — L720 Epidermal cyst: Secondary | ICD-10-CM | POA: Diagnosis not present

## 2016-06-23 DIAGNOSIS — L570 Actinic keratosis: Secondary | ICD-10-CM | POA: Diagnosis not present

## 2016-09-29 DIAGNOSIS — H524 Presbyopia: Secondary | ICD-10-CM | POA: Diagnosis not present

## 2016-09-29 DIAGNOSIS — H01001 Unspecified blepharitis right upper eyelid: Secondary | ICD-10-CM | POA: Diagnosis not present

## 2016-09-29 DIAGNOSIS — H25013 Cortical age-related cataract, bilateral: Secondary | ICD-10-CM | POA: Diagnosis not present

## 2016-09-29 DIAGNOSIS — H04123 Dry eye syndrome of bilateral lacrimal glands: Secondary | ICD-10-CM | POA: Diagnosis not present

## 2016-10-21 DIAGNOSIS — L304 Erythema intertrigo: Secondary | ICD-10-CM | POA: Diagnosis not present

## 2016-10-21 DIAGNOSIS — L570 Actinic keratosis: Secondary | ICD-10-CM | POA: Diagnosis not present

## 2016-10-21 DIAGNOSIS — L720 Epidermal cyst: Secondary | ICD-10-CM | POA: Diagnosis not present

## 2016-11-30 DIAGNOSIS — Z0000 Encounter for general adult medical examination without abnormal findings: Secondary | ICD-10-CM | POA: Diagnosis not present

## 2016-11-30 DIAGNOSIS — R7309 Other abnormal glucose: Secondary | ICD-10-CM | POA: Diagnosis not present

## 2016-11-30 DIAGNOSIS — E7800 Pure hypercholesterolemia, unspecified: Secondary | ICD-10-CM | POA: Diagnosis not present

## 2016-11-30 DIAGNOSIS — E039 Hypothyroidism, unspecified: Secondary | ICD-10-CM | POA: Diagnosis not present

## 2016-12-07 DIAGNOSIS — R7309 Other abnormal glucose: Secondary | ICD-10-CM | POA: Diagnosis not present

## 2016-12-07 DIAGNOSIS — N400 Enlarged prostate without lower urinary tract symptoms: Secondary | ICD-10-CM | POA: Diagnosis not present

## 2016-12-07 DIAGNOSIS — Z23 Encounter for immunization: Secondary | ICD-10-CM | POA: Diagnosis not present

## 2016-12-07 DIAGNOSIS — E7800 Pure hypercholesterolemia, unspecified: Secondary | ICD-10-CM | POA: Diagnosis not present

## 2016-12-07 DIAGNOSIS — N183 Chronic kidney disease, stage 3 (moderate): Secondary | ICD-10-CM | POA: Diagnosis not present

## 2017-01-21 DIAGNOSIS — H6123 Impacted cerumen, bilateral: Secondary | ICD-10-CM | POA: Diagnosis not present

## 2017-04-07 DIAGNOSIS — N183 Chronic kidney disease, stage 3 (moderate): Secondary | ICD-10-CM | POA: Diagnosis not present

## 2017-04-07 DIAGNOSIS — Z8551 Personal history of malignant neoplasm of bladder: Secondary | ICD-10-CM | POA: Diagnosis not present

## 2017-04-07 DIAGNOSIS — R319 Hematuria, unspecified: Secondary | ICD-10-CM | POA: Diagnosis not present

## 2017-04-07 DIAGNOSIS — N390 Urinary tract infection, site not specified: Secondary | ICD-10-CM | POA: Diagnosis not present

## 2017-05-27 DIAGNOSIS — R339 Retention of urine, unspecified: Secondary | ICD-10-CM | POA: Diagnosis not present

## 2017-05-27 DIAGNOSIS — N401 Enlarged prostate with lower urinary tract symptoms: Secondary | ICD-10-CM | POA: Diagnosis not present

## 2017-05-27 DIAGNOSIS — R310 Gross hematuria: Secondary | ICD-10-CM | POA: Diagnosis not present

## 2017-06-03 DIAGNOSIS — R310 Gross hematuria: Secondary | ICD-10-CM | POA: Diagnosis not present

## 2017-06-28 DIAGNOSIS — R7309 Other abnormal glucose: Secondary | ICD-10-CM | POA: Diagnosis not present

## 2017-06-28 DIAGNOSIS — N183 Chronic kidney disease, stage 3 (moderate): Secondary | ICD-10-CM | POA: Diagnosis not present

## 2017-06-28 DIAGNOSIS — E7800 Pure hypercholesterolemia, unspecified: Secondary | ICD-10-CM | POA: Diagnosis not present

## 2017-07-01 DIAGNOSIS — R310 Gross hematuria: Secondary | ICD-10-CM | POA: Diagnosis not present

## 2017-07-05 DIAGNOSIS — N183 Chronic kidney disease, stage 3 (moderate): Secondary | ICD-10-CM | POA: Diagnosis not present

## 2017-07-05 DIAGNOSIS — I129 Hypertensive chronic kidney disease with stage 1 through stage 4 chronic kidney disease, or unspecified chronic kidney disease: Secondary | ICD-10-CM | POA: Diagnosis not present

## 2017-07-05 DIAGNOSIS — E7800 Pure hypercholesterolemia, unspecified: Secondary | ICD-10-CM | POA: Diagnosis not present

## 2017-07-07 ENCOUNTER — Other Ambulatory Visit: Payer: Self-pay | Admitting: Urology

## 2017-07-12 DIAGNOSIS — Z01818 Encounter for other preprocedural examination: Secondary | ICD-10-CM | POA: Diagnosis not present

## 2017-08-04 DIAGNOSIS — L570 Actinic keratosis: Secondary | ICD-10-CM | POA: Diagnosis not present

## 2017-08-04 DIAGNOSIS — D229 Melanocytic nevi, unspecified: Secondary | ICD-10-CM | POA: Diagnosis not present

## 2017-08-09 ENCOUNTER — Encounter (HOSPITAL_BASED_OUTPATIENT_CLINIC_OR_DEPARTMENT_OTHER): Payer: Self-pay | Admitting: *Deleted

## 2017-08-09 DIAGNOSIS — R310 Gross hematuria: Secondary | ICD-10-CM | POA: Diagnosis not present

## 2017-08-09 NOTE — Progress Notes (Signed)
Pt instructed npo pmn 9/23 x synthroid and avapro w sip of water.  To [facility] 9/24 @ 1000.  Needs istat on arrival.  Pt states he has clearance from his PCP. Will request ekg tomorrow as office is closed now.

## 2017-08-16 ENCOUNTER — Ambulatory Visit (HOSPITAL_BASED_OUTPATIENT_CLINIC_OR_DEPARTMENT_OTHER)
Admission: RE | Admit: 2017-08-16 | Discharge: 2017-08-16 | Disposition: A | Discharge status: Home / Self Care | Payer: Medicare Other | Source: Ambulatory Visit | Attending: Urology | Admitting: Urology

## 2017-08-16 ENCOUNTER — Encounter (HOSPITAL_BASED_OUTPATIENT_CLINIC_OR_DEPARTMENT_OTHER): Payer: Self-pay | Admitting: Anesthesiology

## 2017-08-16 ENCOUNTER — Ambulatory Visit (HOSPITAL_BASED_OUTPATIENT_CLINIC_OR_DEPARTMENT_OTHER): Payer: Medicare Other | Admitting: Anesthesiology

## 2017-08-16 ENCOUNTER — Encounter (HOSPITAL_BASED_OUTPATIENT_CLINIC_OR_DEPARTMENT_OTHER)
Admission: RE | Disposition: A | Discharge status: Home / Self Care | Payer: Self-pay | Source: Ambulatory Visit | Attending: Urology

## 2017-08-16 DIAGNOSIS — Z79899 Other long term (current) drug therapy: Secondary | ICD-10-CM | POA: Insufficient documentation

## 2017-08-16 DIAGNOSIS — N401 Enlarged prostate with lower urinary tract symptoms: Secondary | ICD-10-CM | POA: Diagnosis not present

## 2017-08-16 DIAGNOSIS — E039 Hypothyroidism, unspecified: Secondary | ICD-10-CM | POA: Diagnosis not present

## 2017-08-16 DIAGNOSIS — C671 Malignant neoplasm of dome of bladder: Secondary | ICD-10-CM | POA: Insufficient documentation

## 2017-08-16 DIAGNOSIS — C674 Malignant neoplasm of posterior wall of bladder: Secondary | ICD-10-CM | POA: Insufficient documentation

## 2017-08-16 DIAGNOSIS — C670 Malignant neoplasm of trigone of bladder: Secondary | ICD-10-CM | POA: Insufficient documentation

## 2017-08-16 DIAGNOSIS — I10 Essential (primary) hypertension: Secondary | ICD-10-CM | POA: Insufficient documentation

## 2017-08-16 DIAGNOSIS — R350 Frequency of micturition: Secondary | ICD-10-CM | POA: Insufficient documentation

## 2017-08-16 DIAGNOSIS — C676 Malignant neoplasm of ureteric orifice: Secondary | ICD-10-CM | POA: Diagnosis not present

## 2017-08-16 DIAGNOSIS — Z7982 Long term (current) use of aspirin: Secondary | ICD-10-CM | POA: Insufficient documentation

## 2017-08-16 DIAGNOSIS — R310 Gross hematuria: Secondary | ICD-10-CM | POA: Insufficient documentation

## 2017-08-16 DIAGNOSIS — K219 Gastro-esophageal reflux disease without esophagitis: Secondary | ICD-10-CM | POA: Insufficient documentation

## 2017-08-16 DIAGNOSIS — E785 Hyperlipidemia, unspecified: Secondary | ICD-10-CM | POA: Insufficient documentation

## 2017-08-16 DIAGNOSIS — D494 Neoplasm of unspecified behavior of bladder: Secondary | ICD-10-CM | POA: Diagnosis not present

## 2017-08-16 DIAGNOSIS — C679 Malignant neoplasm of bladder, unspecified: Secondary | ICD-10-CM | POA: Diagnosis not present

## 2017-08-16 HISTORY — DX: Personal history of other diseases of the digestive system: Z87.19

## 2017-08-16 HISTORY — DX: Unspecified urinary incontinence: R32

## 2017-08-16 HISTORY — PX: CYSTOSCOPY W/ URETERAL STENT PLACEMENT: SHX1429

## 2017-08-16 HISTORY — DX: Other symptoms and signs involving the musculoskeletal system: R29.898

## 2017-08-16 HISTORY — DX: Bursitis of unspecified shoulder: M75.50

## 2017-08-16 HISTORY — DX: Presence of spectacles and contact lenses: Z97.3

## 2017-08-16 HISTORY — PX: TRANSURETHRAL RESECTION OF BLADDER TUMOR: SHX2575

## 2017-08-16 HISTORY — DX: Unspecified osteoarthritis, unspecified site: M19.90

## 2017-08-16 HISTORY — DX: Unspecified hearing loss, unspecified ear: H91.90

## 2017-08-16 HISTORY — DX: Frequency of micturition: R35.0

## 2017-08-16 LAB — POCT I-STAT 4, (NA,K, GLUC, HGB,HCT)
Glucose, Bld: 93 mg/dL (ref 65–99)
HCT: 45 % (ref 39.0–52.0)
Hemoglobin: 15.3 g/dL (ref 13.0–17.0)
Potassium: 4.1 mmol/L (ref 3.5–5.1)
Sodium: 143 mmol/L (ref 135–145)

## 2017-08-16 SURGERY — TURBT (TRANSURETHRAL RESECTION OF BLADDER TUMOR)
Anesthesia: General | Site: Ureter

## 2017-08-16 MED ORDER — OXYCODONE HCL 5 MG PO TABS
5.0000 mg | ORAL_TABLET | Freq: Once | ORAL | Status: AC | PRN
  Administered 2017-08-16: 5 mg via ORAL
  Filled 2017-08-16: qty 1

## 2017-08-16 MED ORDER — PROPOFOL 10 MG/ML IV BOLUS
INTRAVENOUS | Status: AC
  Filled 2017-08-16: qty 20

## 2017-08-16 MED ORDER — CEFAZOLIN SODIUM-DEXTROSE 2-4 GM/100ML-% IV SOLN
INTRAVENOUS | Status: AC
  Filled 2017-08-16: qty 100

## 2017-08-16 MED ORDER — ONDANSETRON HCL 4 MG/2ML IJ SOLN
INTRAMUSCULAR | Status: AC
  Filled 2017-08-16: qty 2

## 2017-08-16 MED ORDER — SODIUM CHLORIDE 0.9 % IR SOLN
Status: DC | PRN
  Administered 2017-08-16: 3000 mL via INTRAVESICAL

## 2017-08-16 MED ORDER — OXYCODONE HCL 5 MG PO TABS: 5.0000 mg | ORAL_TABLET | ORAL | 0 refills | Status: DC | PRN

## 2017-08-16 MED ORDER — EPHEDRINE SULFATE 50 MG/ML IJ SOLN
INTRAMUSCULAR | Status: DC | PRN
  Administered 2017-08-16 (×2): 10 mg via INTRAVENOUS

## 2017-08-16 MED ORDER — EPHEDRINE 5 MG/ML INJ
INTRAVENOUS | Status: AC
  Filled 2017-08-16: qty 10

## 2017-08-16 MED ORDER — ROCURONIUM BROMIDE 50 MG/5ML IV SOSY
PREFILLED_SYRINGE | INTRAVENOUS | Status: AC
  Filled 2017-08-16: qty 5

## 2017-08-16 MED ORDER — FENTANYL CITRATE (PF) 100 MCG/2ML IJ SOLN
INTRAMUSCULAR | Status: AC
  Filled 2017-08-16: qty 2

## 2017-08-16 MED ORDER — FENTANYL CITRATE (PF) 100 MCG/2ML IJ SOLN
INTRAMUSCULAR | Status: DC | PRN
  Administered 2017-08-16: 50 ug via INTRAVENOUS
  Administered 2017-08-16 (×6): 25 ug via INTRAVENOUS

## 2017-08-16 MED ORDER — LIDOCAINE HCL (CARDIAC) 20 MG/ML IV SOLN
INTRAVENOUS | Status: DC | PRN
  Administered 2017-08-16: 80 mg via INTRAVENOUS

## 2017-08-16 MED ORDER — OXYCODONE HCL 5 MG PO TABS
ORAL_TABLET | ORAL | Status: AC
  Filled 2017-08-16: qty 1

## 2017-08-16 MED ORDER — PROMETHAZINE HCL 25 MG/ML IJ SOLN
6.2500 mg | INTRAMUSCULAR | Status: DC | PRN
  Filled 2017-08-16: qty 1

## 2017-08-16 MED ORDER — HYDROMORPHONE HCL 1 MG/ML IJ SOLN
0.2500 mg | INTRAMUSCULAR | Status: DC | PRN
  Filled 2017-08-16: qty 0.5

## 2017-08-16 MED ORDER — LACTATED RINGERS IV SOLN
INTRAVENOUS | Status: DC
  Administered 2017-08-16: 1000 mL via INTRAVENOUS
  Administered 2017-08-16 (×2): via INTRAVENOUS
  Filled 2017-08-16: qty 1000

## 2017-08-16 MED ORDER — PROPOFOL 10 MG/ML IV BOLUS
INTRAVENOUS | Status: DC | PRN
Start: 2017-08-16 — End: 2017-08-16
  Administered 2017-08-16: 120 mg via INTRAVENOUS

## 2017-08-16 MED ORDER — OXYCODONE HCL 5 MG/5ML PO SOLN
5.0000 mg | Freq: Once | ORAL | Status: AC | PRN
  Filled 2017-08-16: qty 5

## 2017-08-16 MED ORDER — ONDANSETRON HCL 4 MG/2ML IJ SOLN
INTRAMUSCULAR | Status: DC | PRN
  Administered 2017-08-16: 4 mg via INTRAVENOUS

## 2017-08-16 MED ORDER — SODIUM CHLORIDE 0.9 % IV SOLN
INTRAVENOUS | Status: DC | PRN
  Administered 2017-08-16: 11 mL

## 2017-08-16 MED ORDER — LIDOCAINE 2% (20 MG/ML) 5 ML SYRINGE
INTRAMUSCULAR | Status: AC
  Filled 2017-08-16: qty 5

## 2017-08-16 MED ORDER — ROCURONIUM BROMIDE 100 MG/10ML IV SOLN
INTRAVENOUS | Status: DC | PRN
  Administered 2017-08-16: 80 mg via INTRAVENOUS

## 2017-08-16 MED ORDER — DEXAMETHASONE SODIUM PHOSPHATE 10 MG/ML IJ SOLN
INTRAMUSCULAR | Status: AC
  Filled 2017-08-16: qty 1

## 2017-08-16 MED ORDER — SUGAMMADEX SODIUM 200 MG/2ML IV SOLN
INTRAVENOUS | Status: AC
  Filled 2017-08-16: qty 2

## 2017-08-16 MED ORDER — DEXAMETHASONE SODIUM PHOSPHATE 4 MG/ML IJ SOLN
INTRAMUSCULAR | Status: DC | PRN
  Administered 2017-08-16: 10 mg via INTRAVENOUS

## 2017-08-16 MED ORDER — CEFAZOLIN SODIUM-DEXTROSE 2-4 GM/100ML-% IV SOLN
2.0000 g | INTRAVENOUS | Status: AC
  Administered 2017-08-16: 2 g via INTRAVENOUS
  Filled 2017-08-16: qty 100

## 2017-08-16 MED ORDER — SUGAMMADEX SODIUM 200 MG/2ML IV SOLN
INTRAVENOUS | Status: DC | PRN
  Administered 2017-08-16: 200 mg via INTRAVENOUS

## 2017-08-16 MED ORDER — OXYCODONE HCL 5 MG PO TABS: 5.0000 mg | ORAL_TABLET | Freq: Once | ORAL | 0 refills | Status: DC | PRN

## 2017-08-16 SURGICAL SUPPLY — 29 items
BAG DRAIN URO-CYSTO SKYTR STRL (DRAIN) ×8 IMPLANT
BAG DRN ANRFLXCHMBR STRAP LEK (BAG)
BAG DRN UROCATH (DRAIN) ×4
BAG URINE DRAINAGE (UROLOGICAL SUPPLIES) IMPLANT
BAG URINE LEG 19OZ MD ST LTX (BAG) IMPLANT
CATH FOLEY 3WAY 30CC 22F (CATHETERS) ×6 IMPLANT
CATH INTERMIT  6FR 70CM (CATHETERS) IMPLANT
CLOTH BEACON ORANGE TIMEOUT ST (SAFETY) ×4 IMPLANT
ELECT REM PT RETURN 9FT ADLT (ELECTROSURGICAL) ×4
ELECTRODE REM PT RTRN 9FT ADLT (ELECTROSURGICAL) ×2 IMPLANT
EVACUATOR MICROVAS BLADDER (UROLOGICAL SUPPLIES) IMPLANT
GLOVE BIO SURGEON STRL SZ8 (GLOVE) ×4 IMPLANT
GOWN STRL REUS W/TWL LRG LVL3 (GOWN DISPOSABLE) ×4 IMPLANT
GOWN STRL REUS W/TWL XL LVL3 (GOWN DISPOSABLE) ×4 IMPLANT
GUIDEWIRE ANG ZIPWIRE 038X150 (WIRE) ×4 IMPLANT
GUIDEWIRE STR DUAL SENSOR (WIRE) IMPLANT
INFUSOR MANOMETER BAG 3000ML (MISCELLANEOUS) ×4 IMPLANT
IV NS IRRIG 3000ML ARTHROMATIC (IV SOLUTION) ×8 IMPLANT
KIT RM TURNOVER CYSTO AR (KITS) ×4 IMPLANT
LOOP CUT BIPOLAR 24F LRG (ELECTROSURGICAL) ×2 IMPLANT
MANIFOLD NEPTUNE II (INSTRUMENTS) IMPLANT
PACK CYSTO (CUSTOM PROCEDURE TRAY) ×4 IMPLANT
PLUG CATH AND CAP STER (CATHETERS) IMPLANT
STENT URET 6FRX26 CONTOUR (STENTS) ×2 IMPLANT
SYR 30ML LL (SYRINGE) ×4 IMPLANT
SYRINGE 10CC LL (SYRINGE) ×4 IMPLANT
SYRINGE IRR TOOMEY STRL 70CC (SYRINGE) IMPLANT
TUBE CONNECTING 12'X1/4 (SUCTIONS)
TUBE CONNECTING 12X1/4 (SUCTIONS) IMPLANT

## 2017-08-16 NOTE — Transfer of Care (Signed)
Immediate Anesthesia Transfer of Care Note  Patient: [name] [name]  Procedure(s) Performed: Procedure(s) (LRB): TRANSURETHRAL RESECTION OF BLADDER TUMOR (TURBT) (N/A) CYSTOSCOPY WITH RETROGRADE PYELOGRAM/URETERAL STENT PLACEMENT (Bilateral)  Patient Location: PACU  Anesthesia Type: General  Level of Consciousness: awake, sedated, patient cooperative and responds to stimulation  Airway & Oxygen Therapy: Patient Spontanous Breathing and Patient connected to [place] O2  Post-op Assessment: Report given to PACU RN, Post -op Vital signs reviewed and stable and Patient moving all extremities  Post vital signs: Reviewed and stable  Complications: No apparent anesthesia complications

## 2017-08-16 NOTE — Anesthesia Postprocedure Evaluation (Signed)
Anesthesia Post Note  Patient: [name] [name]  Procedure(s) Performed: Procedure(s) (LRB): TRANSURETHRAL RESECTION OF BLADDER TUMOR (TURBT) (N/A) CYSTOSCOPY WITH RETROGRADE PYELOGRAM/URETERAL STENT PLACEMENT (Bilateral)     Patient location during evaluation: PACU Anesthesia Type: General Level of consciousness: awake and alert Pain management: pain level controlled Vital Signs Assessment: post-procedure vital signs reviewed and stable Respiratory status: spontaneous breathing, nonlabored ventilation and respiratory function stable Cardiovascular status: blood pressure returned to baseline and stable Postop Assessment: no apparent nausea or vomiting Anesthetic complications: no    Last Vitals:  Vitals:   08/16/17 1342 08/16/17 1345  BP:  (!) 142/68  Pulse: 87 88  Resp: 10 10  Temp:    SpO2: 93% 93%    Last Pain:  Vitals:   08/16/17 1322  TempSrc:   PainSc: [place]

## 2017-08-16 NOTE — Discharge Instructions (Signed)
CYSTOSCOPY HOME CARE INSTRUCTIONS  Activity: Rest for the remainder of the day.  Do not drive or operate equipment today.  You may resume normal activities in one to two days as instructed by your physician.   Meals: Drink plenty of liquids and eat light foods such as gelatin or soup this evening.  You may return to a normal meal plan tomorrow.  Return to Work: You may return to work in one to two days or as instructed by your physician.  Special Instructions / Symptoms: Call your physician if any of these symptoms occur:   - heavy bleeding  -large blood clots that are difficult to pass  -urine stream diminishes or stops completely  -fever equal to or higher than 101 degrees Farenheit.  -cloudy urine with a strong, foul odor  -severe pain   You may feel some burning pain when you urinate.  This should disappear with time.  Applying moist heat to the lower abdomen may help relieve the pain. _________________________ Indwelling Urinary Catheter Care, Adult Take good care of your catheter to keep it working and to prevent problems. How to wear your catheter Attach your catheter to your leg with tape (adhesive tape) or a leg strap. Make sure it is not too tight. If you use tape, remove any bits of tape that are already on the catheter. How to wear a drainage bag You should have:  A large overnight bag.  A small leg bag.  Overnight Bag You may wear the overnight bag at any time. Always keep the bag below the level of your bladder but off the floor. When you sleep, put a clean plastic bag in a wastebasket. Then hang the bag inside the wastebasket. Leg Bag Never wear the leg bag at night. Always wear the leg bag below your knee. Keep the leg bag secure with a leg strap or tape. How to care for your skin  Clean the skin around the catheter at least once every day.  Shower every day. Do not take baths.  Put creams, lotions, or ointments on your genital area only as told by your  doctor.  Do not use powders, sprays, or lotions on your genital area. How to clean your catheter and your skin 1. Wash your hands with soap and water. 2. Wet a washcloth in warm water and gentle (mild) soap. 3. Use the washcloth to clean the skin where the catheter enters your body. Clean downward and wipe away from the catheter in small circles. Do not wipe toward the catheter. 4. Pat the area dry with a clean towel. Make sure to clean off all soap. How to care for your drainage bags Empty your drainage bag when it is ?- full or at least 2-3 times a day. Replace your drainage bag once a month or sooner if it starts to smell bad or look dirty. Do not clean your drainage bag unless told by your doctor. Emptying a drainage bag  Supplies Needed  Rubbing alcohol.  Gauze pad or cotton ball.  Tape or a leg strap.  Steps 1. Wash your hands with soap and water. 2. Separate (detach) the bag from your leg. 3. Hold the bag over the toilet or a clean container. Keep the bag below your hips and bladder. This stops pee (urine) from going back into the tube. 4. Open the pour spout at the bottom of the bag. 5. Empty the pee into the toilet or container. Do not let the pour spout touch  any surface. 6. Put rubbing alcohol on a gauze pad or cotton ball. 7. Use the gauze pad or cotton ball to clean the pour spout. 8. Close the pour spout. 9. Attach the bag to your leg with tape or a leg strap. 10. Wash your hands.  Changing a drainage bag Supplies Needed  Alcohol wipes.  A clean drainage bag.  Adhesive tape or a leg strap.  Steps 1. Wash your hands with soap and water. 2. Separate the dirty bag from your leg. 3. Pinch the rubber catheter with your fingers so that pee does not spill out. 4. Separate the catheter tube from the drainage tube where these tubes connect (at the connection valve). Do not let the tubes touch any surface. 5. Clean the end of the catheter tube with an alcohol wipe.  Use a different alcohol wipe to clean the end of the drainage tube. 6. Connect the catheter tube to the drainage tube of the clean bag. 7. Attach the new bag to the leg with adhesive tape or a leg strap. 8. Wash your hands.  How to prevent infection and other problems  Never pull on your catheter or try to remove it. Pulling can damage tissue in your body.  Always wash your hands before and after touching your catheter.  If a leg strap gets wet, replace it with a dry one.  Drink enough fluids to keep your pee clear or pale yellow, or as told by your doctor.  Do not let the drainage bag or tubing touch the floor.  Wear cotton underwear.  If you are male, wipe from front to back after you poop (have a bowel movement).  Check on the catheter often to make sure it works and the tubing is not twisted. Get help if:  Your pee is cloudy.  Your pee smells unusually bad.  Your pee is not draining into the bag.  Your tube gets clogged.  Your catheter starts to leak.  Your bladder feels full. Get help right away if:  You have redness, swelling, or pain where the catheter enters your body.  You have fluid, pus, or a bad smell coming from the area where the catheter enters your body.  The area where the catheter enters your body feels warm.  You have a fever.  You have pain in your: ? Stomach (abdomen). ? Legs. ? Lower back. ? Bladder.  You see blood fill the catheter.  Your pee is pink or red.  You feel sick to your stomach (nauseous).  You throw up (vomit).  You have chills.  Your catheter gets pulled out. This information is not intended to replace advice given to you by your health care provider. Make sure you discuss any questions you have with your health care provider. Document Released: 03/06/2013 Document Revised: 10/07/2016 Document Reviewed: 04/24/2014 Elsevier Interactive Patient Education  2018 [facility]  Instructions  Activity: Get plenty of rest for the remainder of the day. A responsible individual must stay with you for 24 hours following the procedure.  For the next 24 hours, DO NOT: -Drive a car -Paediatric nurse -Drink alcoholic beverages -Take any medication unless instructed by your physician -Make any legal decisions or sign important papers.  Meals: Start with liquid foods such as gelatin or soup. Progress to regular foods as tolerated. Avoid greasy, spicy, heavy foods. If nausea and/or vomiting occur, drink only clear liquids until the nausea and/or vomiting subsides. Call your physician if  vomiting continues.  Special Instructions/Symptoms: Your throat may feel dry or sore from the anesthesia or the breathing tube placed in your throat during surgery. If this causes discomfort, gargle with warm salt water. The discomfort should disappear within 24 hours.  If you had a scopolamine patch placed behind your ear for the management of post- operative nausea and/or vomiting:  1. The medication in the patch is effective for 72 hours, after which it should be removed.  Wrap patch in a tissue and discard in the trash. Wash hands thoroughly with soap and water. 2. You may remove the patch earlier than 72 hours if you experience unpleasant side effects which may include dry mouth, dizziness or visual disturbances. 3. Avoid touching the patch. Wash your hands with soap and water after contact with the patch.

## 2017-08-16 NOTE — Op Note (Addendum)
.  Preoperative diagnosis: bladder tumor  Postoperative diagnosis: Same  Procedure: 1 cystoscopy 2. bilateral retrograde pyelography 3.  Intraoperative fluoroscopy, under one hour, with interpretation 4. Transurethral resection of bladder tumor, large 5.  right 6x26 JJ ureteral stent placement  Attending: Nicolette Bang  Anesthesia: General  Estimated blood loss: Minimal  Drains: 1. Right 6x26 JJ ureteral stent 2. 22 French foley  Specimens: bladder tumor  Antibiotics: ancef  Findings: over 10 2-3cm bladder tumors involving trigone, right ureteral orifice, posterior wall and dome.  Ureteral orifices in normal anatomic location. No hydronephrosis or filling defects in either collecting system  Indications: Patient is a 83 year old male/male with a history of bladder tumor and gross hematuria.  After discussing treatment options, they decided proceed with transurethral resection of a bladder tumor.  Procedure her in detail: The patient was brought to the operating room and a brief timeout was done to ensure correct patient, correct procedure, correct site.  General anesthesia was administered patient was placed in dorsal lithotomy position.  Their genitalia was then prepped and draped in usual sterile fashion.  A rigid 47 French cystoscope was passed in the urethra and the bladder.  Bladder was inspected and we noted numerous 2-3cm bladder tumors involving the entire bladder.  the ureteral orifices were in the normal orthotopic locations.  a 6 french ureteral catheter was then instilled into the left ureteral orifice.  a gentle retrograde was obtained and findings noted above. We then turned our attention to the right side. a 6 french ureteral catheter was then instilled into the right ureteral orifice.  a gentle retrograde was obtained and findings noted above. We then advanced a sensor wire through the catheter and up to the renal pelvis. We then advanced a 6x26 JJ ureteral stent up to  the renal pelvis. We removed the wire and good coil was noted in the renal pelvis under fluoroscopy and the bladder under direct vision. We then removed the cystoscope and placed a resectoscope into the bladder. We proceeded to remove the large clot burden from the bladder. Once this was complete we turned our attention to the bladder tumor. Using the bipolar resectoscope we removed the bladder tumor down to the base. A subsequent muscle deep biopsy was then taken. Hemostasis was then obtained with electrocautery. We then removed the bladder tumor chips and sent them for pathology. We then re-inspected the bladder and found no residula bleeding.  the bladder was then drained, a 22 French foley was placed and this concluded the procedure which was well tolerated by patient.  Complications: None  Condition: Stable, extubated, transferred to PACU  Plan: Patient is to be discharged home and followup in 7 days for foley catheter removal and pathology discussion. He will have his stent removed in 3-4 weeks

## 2017-08-16 NOTE — Anesthesia Procedure Notes (Signed)
Procedure Name: Intubation Date/Time: 08/16/2017 11:53 AM Performed by: Justice Rocher Pre-anesthesia Checklist: Patient identified, Emergency Drugs available, Suction available and Patient being monitored Patient Re-evaluated:Patient Re-evaluated prior to induction Oxygen Delivery Method: Circle system utilized Preoxygenation: Pre-oxygenation with 100% oxygen Induction Type: IV induction Ventilation: Mask ventilation without difficulty Laryngoscope Size: Mac and 4 Grade View: Grade III Tube type: Oral Tube size: 8.0 mm Number of attempts: 1 Airway Equipment and Method: Stylet and Oral airway Placement Confirmation: ETT inserted through vocal cords under direct vision,  positive ETCO2 and breath sounds checked- equal and bilateral Secured at: 23 cm Tube secured with: Tape Dental Injury: Teeth and Oropharynx as per pre-operative assessment

## 2017-08-16 NOTE — Anesthesia Preprocedure Evaluation (Signed)
Anesthesia Evaluation  Patient identified by MRN, date of birth, ID band Patient awake    Reviewed: Allergy & Precautions, NPO status , Patient's Chart, lab work & pertinent test results  Airway Mallampati: II  TM Distance: >3 FB Neck ROM: Full    Dental no notable dental hx.    Pulmonary neg pulmonary ROS,    Pulmonary exam normal breath sounds clear to auscultation       Cardiovascular hypertension, Pt. on medications negative cardio ROS Normal cardiovascular exam Rhythm:Regular Rate:Normal     Neuro/Psych negative neurological ROS  negative psych ROS   GI/Hepatic negative GI ROS, Neg liver ROS, GERD  Medicated,  Endo/Other  negative endocrine ROSHypothyroidism   Renal/GU negative Renal ROS  negative genitourinary   Musculoskeletal negative musculoskeletal ROS (+) Arthritis , Osteoarthritis,    Abdominal   Peds negative pediatric ROS (+)  Hematology negative hematology ROS (+)   Anesthesia Other Findings   Reproductive/Obstetrics negative OB ROS                             Anesthesia Physical Anesthesia Plan  ASA: II  Anesthesia Plan: General   Post-op Pain Management:    Induction: Intravenous  PONV Risk Score and Plan: 2 and Ondansetron and Midazolam  Airway Management Planned: LMA  Additional Equipment:   Intra-op Plan:   Post-operative Plan: Extubation in OR  Informed Consent: I have reviewed the patients History and Physical, chart, labs and discussed the procedure including the risks, benefits and alternatives for the proposed anesthesia with the patient or authorized representative who has indicated his/her understanding and acceptance.   Dental advisory given  Plan Discussed with: CRNA  Anesthesia Plan Comments:         Anesthesia Quick Evaluation

## 2017-08-16 NOTE — H&P (Signed)
Urology Admission H&P  Chief Complaint: bladder tumors  History of Present Illness: [name] [name] is a 83yo with a hx of bladder cancer and multiple tumors found on office cystoscopy  Past Medical History:  Diagnosis Date  . Arthritis    lower back bulging disc, hips, knees, thumbs  . Bladder tumor   . Colon polyp   . Early cataracts, bilateral   . Enlarged prostate   . Frequency of urination   . GERD (gastroesophageal reflux disease)   . History of dysphagia    has some difficulty swallowing pills  . HLD (hyperlipidemia)   . HOH (hard of hearing)    slightly hoh  . HTN (hypertension)   . Hypothyroidism   . Incontinence of urine    only wears protection if he's planning to be in public for an extended amt of time  . Shoulder bursitis    undiagnosed. painful and limited ROM on left  . Weakness of extremity    left knee  . Wears glasses    Past Surgical History:  Procedure Laterality Date  . BLADDER SURGERY     turbt  . KNEE ARTHROSCOPY Left 2012  . PROSTATE BIOPSY  2000  . TERP  2011, 2014   x 2    Home Medications:  Prescriptions Prior to Admission  Medication Sig Dispense Refill Last Dose  . acetaminophen (TYLENOL) 650 MG CR tablet Take 650 mg by mouth every 8 (eight) hours as needed for pain.   08/15/2017 at Unknown time  . aspirin 81 MG tablet Take 81 mg by mouth daily.   08/15/2017 at Unknown time  . finasteride (PROSCAR) 5 MG tablet Take 1 tablet by mouth daily.   08/15/2017 at Unknown time  . irbesartan (AVAPRO) 300 MG tablet Take 1 tablet by mouth daily.   08/15/2017 at Unknown time  . levothyroxine (SYNTHROID, LEVOTHROID) 50 MCG tablet Take 1 tablet by mouth daily.   08/15/2017 at Unknown time  . pantoprazole (PROTONIX) 40 MG tablet Take 1 tablet by mouth daily.   08/15/2017 at Unknown time  . PSYLLIUM PO Take by mouth. 1.5 teaspoon twice a day   08/15/2017 at Unknown time  . rosuvastatin (CRESTOR) 20 MG tablet Take 5 mg by mouth daily.    08/15/2017 at Unknown time   . Ascorbic Acid (VITAMIN C) 1000 MG tablet Take 1,000 mg by mouth daily.   More than a month at Unknown time  . beta carotene 25000 UNIT capsule Take 25,000 Units by mouth daily.   More than a month at Unknown time  . BIOTIN PO Take 2,000 mcg by mouth daily.   More than a month at Unknown time  . Cholecalciferol (HM VITAMIN D3) 4000 units CAPS Take 1 capsule by mouth daily.   More than a month at Unknown time  . Chromium Picolinate 200 MCG CAPS Take 1 capsule by mouth daily.   More than a month at Unknown time  . Ginkgo Biloba (GINKOBA PO) Take 120 mg by mouth daily.   More than a month at Unknown time  . meloxicam (MOBIC) 7.5 MG tablet Take 1 tablet by mouth daily.   Taking  . Omega-3 Fatty Acids (OMEGA-3 FISH OIL PO) Take 1 tablet by mouth daily.   More than a month at Unknown time  . vitamin E 400 UNIT capsule Take 400 Units by mouth daily.   More than a month at Unknown time   Allergies:  Allergies  Allergen Reactions  . Ciprofloxacin  REACTION: diarrhea.  Occurred with 750mg  dose.  Has since taken 500mg  doses and has had no reaction/diarrhea.    Family History  Problem Relation Age of Onset  . Hypertension Mother   . Breast cancer Mother   . Other Mother        brain tumor  . Alzheimer's disease Mother   . Emphysema Father        smoker   Social History:  reports that he has never smoked. He has never used smokeless tobacco. He reports that he drinks alcohol. He reports that he does not use drugs.  Review of Systems  All other systems reviewed and are negative.   Physical Exam:  Vital signs in last 24 hours: Temp:  [97.6 F (36.4 C)] 97.6 F (36.4 C) (09/24 1001) Pulse Rate:  [72] 72 (09/24 1001) Resp:  [18] 18 (09/24 1001) BP: (161)/(84) 161/84 (09/24 1001) SpO2:  [99 %] 99 % (09/24 1001) Weight:  [87.1 kg (192 lb)] 87.1 kg (192 lb) (09/24 1001) Physical Exam  Constitutional: He is oriented to person, place, and time. He appears well-developed and  well-nourished.  HENT:  Head: Normocephalic and atraumatic.  Eyes: Pupils are equal, round, and reactive to light. EOM are normal.  Neck: Normal range of motion. No thyromegaly present.  Cardiovascular: Normal rate and regular rhythm.   Respiratory: Effort normal. No respiratory distress.  GI: Soft. He exhibits no distension.  Genitourinary: Penis normal.  Musculoskeletal: Normal range of motion. He exhibits no edema.  Neurological: He is alert and oriented to person, place, and time.  Skin: Skin is warm and dry.  Psychiatric: He has a normal mood and affect. His behavior is normal. Judgment and thought content normal.    Laboratory Data:  Results for orders placed or performed during the hospital encounter of 08/16/17 (from the past 24 hour(s))  I-STAT 4, (NA,K, GLUC, HGB,HCT)     Status: None   Collection Time: 08/16/17 11:04 AM  Result Value Ref Range   Sodium 143 135 - 145 mmol/L   Potassium 4.1 3.5 - 5.1 mmol/L   Glucose, Bld 93 65 - 99 mg/dL   HCT 45.0 39.0 - 52.0 %   Hemoglobin 15.3 13.0 - 17.0 g/dL   No results found for this or any previous visit (from the past 240 hour(s)). Creatinine: No results for input(s): CREATININE in the last 168 hours. Baseline Creatinine: unknwon  Impression/Assessment:  84yo with bladder tumors  Plan:  The risks/benefits/alternatives to TURBT was explained to the patient and he understands ands and wishes to proceed with surgery  [name] [name] 08/16/2017, 11:36 AM

## 2017-08-17 ENCOUNTER — Encounter (HOSPITAL_BASED_OUTPATIENT_CLINIC_OR_DEPARTMENT_OTHER): Payer: Self-pay | Admitting: Urology

## 2017-08-24 DIAGNOSIS — C678 Malignant neoplasm of overlapping sites of bladder: Secondary | ICD-10-CM | POA: Diagnosis not present

## 2017-08-30 ENCOUNTER — Other Ambulatory Visit: Payer: Self-pay | Admitting: Urology

## 2017-09-10 ENCOUNTER — Encounter (HOSPITAL_BASED_OUTPATIENT_CLINIC_OR_DEPARTMENT_OTHER): Payer: Self-pay | Admitting: *Deleted

## 2017-09-10 NOTE — Progress Notes (Signed)
NPO AFTER MN W/ EXCEPTION CLEAR LIQUIDS UNTIL 0830 ( NO CREAM /MILK PRODUCTS).  ARRIVE AT 6190.  NEEDS ISTAT 8 AND EKG.  WILL TAKE SYNTHROID AND AVAPRO AM DOS W/ SIPS OF WATER.

## 2017-09-11 ENCOUNTER — Encounter (HOSPITAL_COMMUNITY): Payer: Self-pay | Admitting: Radiology

## 2017-09-11 ENCOUNTER — Emergency Department (HOSPITAL_COMMUNITY): Payer: Medicare Other

## 2017-09-11 ENCOUNTER — Inpatient Hospital Stay (HOSPITAL_COMMUNITY)
Admission: EM | Admit: 2017-09-11 | Discharge: 2017-09-14 | DRG: 690 | Disposition: A | Discharge status: Home / Self Care | Payer: Medicare Other | Attending: Urology | Admitting: Urology

## 2017-09-11 DIAGNOSIS — N1339 Other hydronephrosis: Secondary | ICD-10-CM | POA: Diagnosis not present

## 2017-09-11 DIAGNOSIS — R030 Elevated blood-pressure reading, without diagnosis of hypertension: Secondary | ICD-10-CM | POA: Diagnosis not present

## 2017-09-11 DIAGNOSIS — N400 Enlarged prostate without lower urinary tract symptoms: Secondary | ICD-10-CM | POA: Diagnosis present

## 2017-09-11 DIAGNOSIS — E785 Hyperlipidemia, unspecified: Secondary | ICD-10-CM | POA: Diagnosis not present

## 2017-09-11 DIAGNOSIS — N3289 Other specified disorders of bladder: Secondary | ICD-10-CM | POA: Diagnosis not present

## 2017-09-11 DIAGNOSIS — R310 Gross hematuria: Secondary | ICD-10-CM | POA: Diagnosis not present

## 2017-09-11 DIAGNOSIS — Z23 Encounter for immunization: Secondary | ICD-10-CM | POA: Diagnosis not present

## 2017-09-11 DIAGNOSIS — R319 Hematuria, unspecified: Secondary | ICD-10-CM | POA: Diagnosis not present

## 2017-09-11 DIAGNOSIS — N390 Urinary tract infection, site not specified: Secondary | ICD-10-CM | POA: Diagnosis not present

## 2017-09-11 DIAGNOSIS — N1330 Unspecified hydronephrosis: Secondary | ICD-10-CM | POA: Diagnosis not present

## 2017-09-11 DIAGNOSIS — Z881 Allergy status to other antibiotic agents status: Secondary | ICD-10-CM | POA: Diagnosis not present

## 2017-09-11 DIAGNOSIS — N136 Pyonephrosis: Secondary | ICD-10-CM | POA: Diagnosis not present

## 2017-09-11 DIAGNOSIS — I10 Essential (primary) hypertension: Secondary | ICD-10-CM | POA: Diagnosis present

## 2017-09-11 LAB — CBC WITH DIFFERENTIAL/PLATELET
Basophils Absolute: 0 10*3/uL (ref 0.0–0.1)
Basophils Relative: 0 %
Eosinophils Absolute: 0 10*3/uL (ref 0.0–0.7)
Eosinophils Relative: 0 %
HCT: 36.8 % — ABNORMAL LOW (ref 39.0–52.0)
Hemoglobin: 12.4 g/dL — ABNORMAL LOW (ref 13.0–17.0)
Lymphocytes Relative: 5 %
Lymphs Abs: 0.7 10*3/uL (ref 0.7–4.0)
MCH: 30 pg (ref 26.0–34.0)
MCHC: 33.7 g/dL (ref 30.0–36.0)
MCV: 88.9 fL (ref 78.0–100.0)
Monocytes Absolute: 0.6 10*3/uL (ref 0.1–1.0)
Monocytes Relative: 4 %
Neutro Abs: 12.8 10*3/uL — ABNORMAL HIGH (ref 1.7–7.7)
Neutrophils Relative %: 91 %
Platelets: 327 10*3/uL (ref 150–400)
RBC: 4.14 MIL/uL — ABNORMAL LOW (ref 4.22–5.81)
RDW: 13.8 % (ref 11.5–15.5)
WBC: 14.2 10*3/uL — ABNORMAL HIGH (ref 4.0–10.5)

## 2017-09-11 LAB — URINALYSIS, ROUTINE W REFLEX MICROSCOPIC

## 2017-09-11 LAB — BASIC METABOLIC PANEL
Anion gap: 11 (ref 5–15)
BUN: 32 mg/dL — ABNORMAL HIGH (ref 6–20)
CO2: 22 mmol/L (ref 22–32)
Calcium: 8.8 mg/dL — ABNORMAL LOW (ref 8.9–10.3)
Chloride: 104 mmol/L (ref 101–111)
Creatinine, Ser: 1.46 mg/dL — ABNORMAL HIGH (ref 0.61–1.24)
GFR calc Af Amer: 50 mL/min — ABNORMAL LOW (ref >60)
GFR calc non Af Amer: 43 mL/min — ABNORMAL LOW (ref >60)
Glucose, Bld: 128 mg/dL — ABNORMAL HIGH (ref 65–99)
Potassium: 3.8 mmol/L (ref 3.5–5.1)
Sodium: 137 mmol/L (ref 135–145)

## 2017-09-11 LAB — HEMOGLOBIN AND HEMATOCRIT, BLOOD
HCT: 35.7 % — ABNORMAL LOW (ref 39.0–52.0)
Hemoglobin: 12.1 g/dL — ABNORMAL LOW (ref 13.0–17.0)

## 2017-09-11 LAB — URINALYSIS, MICROSCOPIC (REFLEX)
Bacteria, UA: NONE SEEN
Squamous Epithelial / LPF: NONE SEEN

## 2017-09-11 MED ORDER — IOPAMIDOL (ISOVUE-300) INJECTION 61%
INTRAVENOUS | Status: AC
  Filled 2017-09-11: qty 100

## 2017-09-11 MED ORDER — IRBESARTAN 300 MG PO TABS
300.0000 mg | ORAL_TABLET | Freq: Every morning | ORAL | Status: DC
  Administered 2017-09-11 – 2017-09-14 (×4): 300 mg via ORAL
  Filled 2017-09-11 (×4): qty 1

## 2017-09-11 MED ORDER — INFLUENZA VAC SPLIT HIGH-DOSE 0.5 ML IM SUSY
0.5000 mL | PREFILLED_SYRINGE | INTRAMUSCULAR | Status: AC
  Administered 2017-09-12: 0.5 mL via INTRAMUSCULAR
  Filled 2017-09-11: qty 0.5

## 2017-09-11 MED ORDER — SODIUM CHLORIDE 0.9 % IR SOLN
3000.0000 mL | Status: DC
  Administered 2017-09-11: 3000 mL

## 2017-09-11 MED ORDER — KETOROLAC TROMETHAMINE 15 MG/ML IJ SOLN: 15.0000 mg | Freq: Once | INTRAMUSCULAR | Status: DC

## 2017-09-11 MED ORDER — MORPHINE SULFATE (PF) 4 MG/ML IV SOLN
4.0000 mg | Freq: Once | INTRAVENOUS | Status: AC
  Administered 2017-09-11: 4 mg via INTRAVENOUS
  Filled 2017-09-11: qty 1

## 2017-09-11 MED ORDER — OXYCODONE HCL 5 MG PO TABS
5.0000 mg | ORAL_TABLET | ORAL | Status: DC | PRN
  Administered 2017-09-11 – 2017-09-14 (×8): 5 mg via ORAL
  Filled 2017-09-11 (×8): qty 1

## 2017-09-11 MED ORDER — ROSUVASTATIN CALCIUM 5 MG PO TABS
5.0000 mg | ORAL_TABLET | Freq: Every day | ORAL | Status: DC
  Administered 2017-09-11 – 2017-09-13 (×3): 5 mg via ORAL
  Filled 2017-09-11 (×3): qty 1

## 2017-09-11 MED ORDER — PANTOPRAZOLE SODIUM 40 MG PO TBEC
40.0000 mg | DELAYED_RELEASE_TABLET | Freq: Every day | ORAL | Status: DC
  Administered 2017-09-11 – 2017-09-13 (×3): 40 mg via ORAL
  Filled 2017-09-11 (×3): qty 1

## 2017-09-11 MED ORDER — FINASTERIDE 5 MG PO TABS
5.0000 mg | ORAL_TABLET | Freq: Every evening | ORAL | Status: DC
  Administered 2017-09-11 – 2017-09-13 (×3): 5 mg via ORAL
  Filled 2017-09-11 (×3): qty 1

## 2017-09-11 MED ORDER — IOPAMIDOL (ISOVUE-300) INJECTION 61%
80.0000 mL | Freq: Once | INTRAVENOUS | Status: AC | PRN
  Administered 2017-09-11: 80 mL via INTRAVENOUS

## 2017-09-11 MED ORDER — BELLADONNA ALKALOIDS-OPIUM 16.2-60 MG RE SUPP
1.0000 | Freq: Three times a day (TID) | RECTAL | Status: DC | PRN
  Administered 2017-09-11 – 2017-09-13 (×3): 1 via RECTAL
  Filled 2017-09-11 (×4): qty 1

## 2017-09-11 MED ORDER — ENSURE ENLIVE PO LIQD
237.0000 mL | Freq: Two times a day (BID) | ORAL | Status: DC
  Administered 2017-09-12 – 2017-09-14 (×5): 237 mL via ORAL

## 2017-09-11 MED ORDER — LEVOTHYROXINE SODIUM 50 MCG PO TABS
50.0000 ug | ORAL_TABLET | Freq: Every day | ORAL | Status: DC
  Administered 2017-09-12 – 2017-09-14 (×3): 50 ug via ORAL
  Filled 2017-09-11 (×3): qty 1

## 2017-09-11 NOTE — ED Notes (Signed)
Bed: WA16 Expected date:  Expected time:  Means of arrival:  Comments: RES A 

## 2017-09-11 NOTE — Progress Notes (Signed)
AC notified after arrival of patient from ED that admission was denoted as urology and pt mentioned that Dr. Louis Meckel had mentioned would be moving up to urology floor after seen in ED. Pt currently has CBI going via 3-way catheter with no problems noted. AC arranged for pt to be transferred to urology unit.

## 2017-09-11 NOTE — ED Notes (Signed)
Pt feeling a lot of pain after catheter insertion.

## 2017-09-11 NOTE — Progress Notes (Signed)
Pt transferred to [facility] via bed accompanied by nurse and [employer]. CBI remains in progress with urine noted as light to medium pink.

## 2017-09-11 NOTE — ED Notes (Signed)
Patient transported to CT 

## 2017-09-11 NOTE — ED Triage Notes (Signed)
Pt states she has not been able to void since midnight. Adds that she had a TURP on 08/16/17 and has another one scheduled soon. He also states he noted some hematuria prior to not being able to void.

## 2017-09-11 NOTE — Progress Notes (Addendum)
Report given to [facility], RN of [place] on pt status and recent vitals.

## 2017-09-11 NOTE — ED Notes (Addendum)
24 fr catheter inserted into bladder. Aprox 10cc urine returned. When attempting to irrigate foley, patient c/o severe pain and no output with irrigation. Bladder scan showed 13ml. Dr. Tomi Bamberger aware and to bedside to assess. Foley removed.

## 2017-09-11 NOTE — H&P (Signed)
Cc: Gross hematuria and clot retention  History of present illness:  83 year old male presents to the emergency department today with progressive gross hematuria and feeling of fullness and inability to void.  He initially had a transurethral resection of bladder tumors on 08/12/17 by Dr. Alyson Ingles.  He had been doing well, hematuria was resolving, but yesterday he developed a sudden onset of gross hematuria.  He was having severe urgency but was able to void initially.  However, this morning he developed clot retention.  But  In the emergency department a attempt was made to pass a three-way Foley catheter and irrigate him unsuccessfully.  A CT scan was obtained demonstrating some mild right hydro-ureteronephrosis with some clot around the stent and within the bladder.  The patient also had some mild left hydro-.  I was subsequently consulted for help irrigating the patient's bladder.  Review of systems: A 12 point comprehensive review of systems was obtained and is negative unless otherwise stated in the history of present illness.  Patient Active Problem List   Diagnosis Date Noted  . Gross hematuria 09/11/2017    No current facility-administered medications on file prior to encounter.    Current Outpatient Prescriptions on File Prior to Encounter  Medication Sig Dispense Refill  . aspirin 81 MG tablet Take 81 mg by mouth every evening.     . finasteride (PROSCAR) 5 MG tablet Take 1 tablet by mouth every evening.     . irbesartan (AVAPRO) 300 MG tablet Take 1 tablet by mouth every morning.     Marland Kitchen levothyroxine (SYNTHROID, LEVOTHROID) 50 MCG tablet Take 1 tablet by mouth daily before breakfast.     . pantoprazole (PROTONIX) 40 MG tablet Take 1 tablet by mouth at bedtime.     . rosuvastatin (CRESTOR) 5 MG tablet Take 5 mg by mouth at bedtime.    Marland Kitchen oxyCODONE (OXY IR/ROXICODONE) 5 MG immediate release tablet Take 1 tablet (5 mg total) by mouth every 4 (four) hours as needed for moderate pain.  (Patient not taking: Reported on 09/11/2017) 30 tablet 0    Past Medical History:  Diagnosis Date  . Arthritis    lower back bulging disc, hips, knees, thumbs, shoulder  . Bladder cancer ([place]) UROLOGIST-  DR MCKENZIE   S/P TURBT 08-16-2017  . BPH (benign prostatic hyperplasia)   . Elevated PSA   . Frequency of urination   . GERD (gastroesophageal reflux disease)   . Hematuria   . History of colon polyps   . History of dysphagia    has some difficulty swallowing pills  . History of esophageal dilatation    for dysplasia w/ pills  . HLD (hyperlipidemia)   . HOH (hard of hearing)    slightly hoh  . HTN (hypertension)   . Hypothyroidism   . Incontinence of urine    only wears protection if he's planning to be in public for an extended amt of time  . 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 Shoulder bursitis    undiagnosed. painful and limited ROM on left  . Weakness of extremity    left knee  . Wears glasses     Past Surgical History:  Procedure Laterality Date  . CYSTOSCOPY W/ URETERAL STENT PLACEMENT Bilateral 08/16/2017   Procedure: CYSTOSCOPY WITH RETROGRADE PYELOGRAM/URETERAL STENT PLACEMENT;  Surgeon: Cleon Gustin, MD;  Location: [facility];  Service: Urology;  Laterality: Bilateral;  . KNEE ARTHROSCOPY Left 06/10/2011  . PROSTATE BIOPSY  2000  . TRANSURETHRAL RESECTION OF BLADDER TUMOR N/A 08/16/2017   Procedure: TRANSURETHRAL RESECTION OF BLADDER TUMOR (TURBT);  Surgeon: Cleon Gustin, MD;  Location: [facility];  Service: Urology;  Laterality: N/A;  . TRANSURETHRAL RESECTION OF BLADDER TUMOR  12-09-2012   dr [facility]   and TURP  . TRANSURETHRAL RESECTION OF PROSTATE  2011    Social History  Substance Use Topics  . Smoking status: Never Smoker  . Smokeless tobacco: Never Used   . Alcohol use 0.0 oz/week     Comment: occa beer or wine    Family History  Problem Relation Age of Onset  . Hypertension Mother   . Breast cancer Mother   . Other Mother        brain tumor  . Alzheimer's disease Mother   . Emphysema Father        smoker    PE: Vitals:   09/11/17 1200 09/11/17 1209 09/11/17 1215 09/11/17 1230  BP: 135/74 135/74  (!) 108/94  Pulse: 80 81 76 81  Resp:  16  18  Temp:      TempSrc:      SpO2: 95% 97% 97% 97%  Weight:      Height:       Patient appears to be in no acute distress  patient is alert and oriented x3 Atraumatic normocephalic head No cervical or supraclavicular lymphadenopathy appreciated No increased work of breathing, no audible wheezes/rhonchi Regular sinus rhythm/rate Abdomen is soft, nontender, nondistended, no CVA or suprapubic tenderness Lower extremities are symmetric without appreciable edema Grossly neurologically intact No identifiable skin lesions   Recent Labs  09/11/17 0732  WBC 14.2*  HGB 12.4*  HCT 36.8*    Recent Labs  09/11/17 0732  NA 137  K 3.8  CL 104  CO2 22  GLUCOSE 128*  BUN 32*  CREATININE 1.46*  CALCIUM 8.8*   No results for input(s): LABPT, INR in the last 72 hours. No results for input(s): LABURIN in the last 72 hours. No results found for this or any previous visit.  Imaging: I have independently reviewed the patient's CT scan with the above findings.  Procedure: Using sterile technique I inserted a 24 French three-way Foley catheter atraumatically.  I then hand irrigated the patient's bladder with approximately 1 L of normal saline and lots of clot was removed.  We then placed him on continuous  irrigation.  Imp: The patient has gross hematuria likely from his previous resection and possibly bladder irritation from the stent.  I was able to clear the patient's bladder in the ER, and we will plan to admit him for observation and continuous bladder  irrigation.  Recommendations: I have resumed the patient's home medications.  He is now on continuous bladder irrigation.  We will titrate this to a light pink color and wean to off.  We will reevaluate tomorrow for voiding trial and discharge home.   Louis Meckel W

## 2017-09-11 NOTE — ED Provider Notes (Signed)
[facility] DEPT Provider Note   CSN: [id number] Arrival date & time: 09/11/17  0700     History   Chief Complaint Chief Complaint  Patient presents with  . Urinary Retention    HPI FRANCIS DOENGES is a 83 y.o. male.  HPI Patient presents to the emergent for evaluation of hematuria and urinary retention. Patient has a tumor resection from his bladder on September 24. Following that procedure he had a Foley catheter for a period Of time.   Patient had the catheter removed and he had been doing well. He initially had hematuria after the surgery but that had resolved.Last evening he suddenly had an episode of hematuria associated with some clots of blood. He called his urologist and was told to drink some glass of water. Patient felt like he was doing well however when he woke up this morning he feels like his bladder is very full and he is unable to urinate. He denies any fevers or chills. No vomiting or diarrhea. No other complaints  Past Medical History:  Diagnosis Date  . Arthritis    lower back bulging disc, hips, knees, thumbs, shoulder  . Bladder cancer ([place]) UROLOGIST-  DR MCKENZIE   S/P TURBT 08-16-2017  . BPH (benign prostatic hyperplasia)   . Elevated PSA   . Frequency of urination   . GERD (gastroesophageal reflux disease)   . Hematuria   . History of colon polyps   . History of dysphagia    has some difficulty swallowing pills  . History of esophageal dilatation    for dysplasia w/ pills  . HLD (hyperlipidemia)   . HOH (hard of hearing)    slightly hoh  . HTN (hypertension)   . Hypothyroidism   . Incontinence of urine    only wears protection if he's planning to be in public for an extended amt of time  . 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 Shoulder bursitis    undiagnosed. painful and  limited ROM on left  . Weakness of extremity    left knee  . Wears glasses     There are no active problems to display for this patient.   Past Surgical History:  Procedure Laterality Date  . CYSTOSCOPY W/ URETERAL STENT PLACEMENT Bilateral 08/16/2017   Procedure: CYSTOSCOPY WITH RETROGRADE PYELOGRAM/URETERAL STENT PLACEMENT;  Surgeon: Cleon Gustin, MD;  Location: [facility];  Service: Urology;  Laterality: Bilateral;  . KNEE ARTHROSCOPY Left 06/10/2011  . PROSTATE BIOPSY  2000  . TRANSURETHRAL RESECTION OF BLADDER TUMOR N/A 08/16/2017   Procedure: TRANSURETHRAL RESECTION OF BLADDER TUMOR (TURBT);  Surgeon: Cleon Gustin, MD;  Location: [facility];  Service: Urology;  Laterality: N/A;  . TRANSURETHRAL RESECTION OF BLADDER TUMOR  12-09-2012   dr Alona Bene [facility]   and TURP  . TRANSURETHRAL RESECTION OF PROSTATE  2011       Home Medications    Prior to Admission medications   Medication Sig Start Date End Date Taking? Authorizing Provider  aspirin 81 MG tablet Take 81 mg by mouth every evening.    Yes [provider]  finasteride (PROSCAR) 5 MG tablet Take 1 tablet by mouth every evening.  10/30/15  Yes [provider]  irbesartan (AVAPRO) 300 MG tablet Take 1 tablet by mouth every morning.  12/25/15  Yes [provider]  levothyroxine (SYNTHROID, LEVOTHROID) 50 MCG tablet Take 1 tablet by mouth daily before breakfast.  12/25/15  Yes [provider]  oxyCODONE-acetaminophen (PERCOCET/ROXICET) 5-325 MG tablet Take 1 tablet by mouth every 4 (four) hours as needed for severe pain.   Yes [provider]  pantoprazole (PROTONIX) 40 MG tablet Take 1 tablet by mouth at bedtime.  12/20/15  Yes [provider]  rosuvastatin (CRESTOR) 5 MG tablet Take 5 mg by mouth at bedtime.   Yes [provider]  oxyCODONE (OXY IR/ROXICODONE) 5 MG immediate release tablet Take 1 tablet (5 mg total) by  mouth every 4 (four) hours as needed for moderate pain. Patient not taking: Reported on 09/11/2017 08/16/17   Cleon Gustin, MD    Family History Family History  Problem Relation Age of Onset  . Hypertension Mother   . Breast cancer Mother   . Other Mother        brain tumor  . Alzheimer's disease Mother   . Emphysema Father        smoker    Social History Social History  Substance Use Topics  . Smoking status: Never Smoker  . Smokeless tobacco: Never Used  . Alcohol use 0.0 oz/week     Comment: occa beer or wine     Allergies   Ciprofloxacin and Cephalexin   Review of Systems Review of Systems  All other systems reviewed and are negative.    Physical Exam Updated Vital Signs BP 135/74   Pulse 81   Temp 97.7 F (36.5 C) (Oral)   Resp 16   Ht 1.753 m (5\' 9" )   Wt 87.1 kg (192 lb)   SpO2 97%   BMI 28.35 kg/m   Physical Exam  Constitutional: No distress.  HENT:  Head: Normocephalic and atraumatic.  Right Ear: External ear normal.  Left Ear: External ear normal.  Eyes: Conjunctivae are normal. Right eye exhibits no discharge. Left eye exhibits no discharge. No scleral icterus.  Neck: Neck supple. No tracheal deviation present.  Cardiovascular: Normal rate, regular rhythm and intact distal pulses.   Pulmonary/Chest: Effort normal and breath sounds normal. No stridor. No respiratory distress. He has no wheezes. He has no rales.  Abdominal: Soft. Bowel sounds are normal. He exhibits no distension. There is tenderness (suprapubic region). There is no rebound and no guarding.  Musculoskeletal: He exhibits no edema or tenderness.  Neurological: He is alert. He has normal strength. No cranial nerve deficit (no facial droop, extraocular movements intact, no slurred speech) or sensory deficit. He exhibits normal muscle tone. He displays no seizure activity. Coordination normal.  Skin: Skin is warm and dry. No rash noted.  Psychiatric: He has a normal mood and  affect.  Nursing note and vitals reviewed.    ED Treatments / Results  Labs (all labs ordered are listed, but only abnormal results are displayed) Labs Reviewed  CBC WITH DIFFERENTIAL/PLATELET - Abnormal; Notable for the following:       Result Value   WBC 14.2 (*)    RBC 4.14 (*)    Hemoglobin 12.4 (*)    HCT 36.8 (*)    Neutro Abs 12.8 (*)    All other components within normal limits  BASIC METABOLIC PANEL - Abnormal; Notable for the following:    Glucose, Bld 128 (*)    BUN 32 (*)    Creatinine, Ser 1.46 (*)    Calcium 8.8 (*)    GFR calc non Af Amer 43 (*)  GFR calc Af Amer 50 (*)    All other components within normal limits  URINALYSIS, ROUTINE W REFLEX MICROSCOPIC - Abnormal; Notable for the following:    Color, Urine RED (*)    APPearance TURBID (*)    Glucose, UA   (*)    Value: TEST NOT REPORTED DUE TO COLOR INTERFERENCE OF URINE PIGMENT   Hgb urine dipstick   (*)    Value: TEST NOT REPORTED DUE TO COLOR INTERFERENCE OF URINE PIGMENT   Bilirubin Urine   (*)    Value: TEST NOT REPORTED DUE TO COLOR INTERFERENCE OF URINE PIGMENT   Ketones, ur   (*)    Value: TEST NOT REPORTED DUE TO COLOR INTERFERENCE OF URINE PIGMENT   Protein, ur   (*)    Value: TEST NOT REPORTED DUE TO COLOR INTERFERENCE OF URINE PIGMENT   Nitrite   (*)    Value: TEST NOT REPORTED DUE TO COLOR INTERFERENCE OF URINE PIGMENT   Leukocytes, UA   (*)    Value: TEST NOT REPORTED DUE TO COLOR INTERFERENCE OF URINE PIGMENT   All other components within normal limits  URINALYSIS, MICROSCOPIC (REFLEX)    Radiology Ct Abdomen Pelvis W Contrast  Result Date: 09/11/2017 CLINICAL DATA:  The patient had a recent TURP, August 16, 2017, and is unable to void his bladder. Hematuria was also seen prior to the lack of bleeding. EXAM: CT ABDOMEN AND PELVIS WITH CONTRAST TECHNIQUE: Multidetector CT imaging of the abdomen and pelvis was performed using the standard protocol following bolus administration of  intravenous contrast. CONTRAST:  87mL ISOVUE-300 IOPAMIDOL (ISOVUE-300) INJECTION 61% COMPARISON:  June 03, 2017 FINDINGS: Lower chest: No acute abnormality. Hepatobiliary: No focal liver abnormality is seen. No gallstones, gallbladder wall thickening, or biliary dilatation. Pancreas: Unremarkable. No pancreatic ductal dilatation or surrounding inflammatory changes. Spleen: Normal in size without focal abnormality. Adrenals/Urinary Tract: The adrenal glands are normal. No suspicious renal masses or stones. Mild hydronephrosis seen bilaterally with a ureteral stent on the right. No stones are seen in either ureter. High attenuation material is seen within the bladder with an apparent clot around the bladder portion of the right ureteral stent. Air is also seen the bladder which is relatively thin walled. No extraluminal air identified. On delayed images, there is mildly delayed excretion, particularly from the right kidney. On 6 minutes delayed images, contrast had not reached to the bladder. Stomach/Bowel: The stomach is not well assessed due to poor distention the duodenum diverticulum is identified. The small bowel is otherwise normal. Colonic diverticuli are seen without diverticulitis. No evidence of appendicitis with a normal appearing appendix. Vascular/Lymphatic: Atherosclerotic change seen in the nonaneurysmal aorta, iliac vessels, and femoral vessels. No adenopathy. Reproductive: Post TURP changes in the prostate. Other: A small amount of free fluid is seen in the pelvis, likely reactive. Musculoskeletal: No acute or significant osseous findings. IMPRESSION: 1. High attenuation material in the bladder is consistent with blood products and clot. Clot appears to have coalesced around the bladder portion of the right ureteral stent, likely the cause of mild right hydronephrosis. Mild hydronephrosis on the left is likely due to bladder outlet obstruction due the clot and blood products. 2. Air in the bladder  may be due to recent catheterization. There is no bladder wall thickening. Recommend clinical correlation. 3. The bladder could not be filled with contrast due to relative mild obstruction and delayed excretion from the kidneys. This limits evaluation for bladder perforation. However, the thin walled nature  of the bladder and the lack of extraluminal air, given the air within the bladder, would suggest that there is no perforation. 4. Post TURP changes in the prostate. 5. Atherosclerotic changes in the abdominal aorta. Aortic Atherosclerosis (ICD10-I70.0). Electronically Signed   By: Dorise Bullion III M.D   On: 09/11/2017 11:41    Procedures Procedures (including critical care time)  Medications Ordered in ED Medications  iopamidol (ISOVUE-300) 61 % injection (not administered)  morphine 4 MG/ML injection 4 mg (4 mg Intravenous Given 09/11/17 0852)  morphine 4 MG/ML injection 4 mg (4 mg Intravenous Given 09/11/17 1034)  iopamidol (ISOVUE-300) 61 % injection 80 mL (80 mLs Intravenous Contrast Given 09/11/17 1043)     Initial Impression / Assessment and Plan / ED Course  I have reviewed the triage vital signs and the nursing notes.  Pertinent labs & imaging results that were available during my care of the patient were reviewed by me and considered in my medical decision making (see chart for details).  Clinical Course as of Sep 12 1231  Sat Sep 11, 2017  0900 Nurses attempted to insert a Foley catheter induced constant irrigation. Patient was unable to tolerate this and they had to remove the catheter  [JK]  0946 Ua with hematuria but not definite infection.   With his degree of pain and discomfort will CT to evaluate further  [JK]  1031 Patient is requesting additional pain meds. CT scan is pending  [JK]  1215 Discussed with Dr Louis Meckel.  Will plan on foley catheter insertion and continuous bladder irrigation.  Admit for further treatment.  I updated pt on the plan.  [JK]    Clinical  Course User Index [JK] Dorie Rank, MD    Patient presented to the emergency room with complaints of hematuria.  He has a history of bladder tumor resection last month but his hematuria had resolved until last evening. Patient's symptoms were concerning for persistent bladder spasms. With his degree of discomfort and inability to get him more comfortable here, I ordered a CT scan of his abdomen pelvis. CT did scan does show  large clots in the bladder as well as a degree of hydronephrosis related to the clots. I discussed case with Dr. Louis Meckel. He plans on admitting the hospital for continuous irrigation of his bladder and removal of clots.  Final Clinical Impressions(s) / ED Diagnoses   Final diagnoses:  Gross hematuria  Bladder spasms  Other hydronephrosis      Dorie Rank, MD 09/11/17 1233

## 2017-09-12 DIAGNOSIS — N400 Enlarged prostate without lower urinary tract symptoms: Secondary | ICD-10-CM | POA: Diagnosis present

## 2017-09-12 DIAGNOSIS — I10 Essential (primary) hypertension: Secondary | ICD-10-CM | POA: Diagnosis present

## 2017-09-12 DIAGNOSIS — E785 Hyperlipidemia, unspecified: Secondary | ICD-10-CM | POA: Diagnosis present

## 2017-09-12 DIAGNOSIS — R310 Gross hematuria: Secondary | ICD-10-CM | POA: Diagnosis not present

## 2017-09-12 DIAGNOSIS — Z23 Encounter for immunization: Secondary | ICD-10-CM | POA: Diagnosis not present

## 2017-09-12 DIAGNOSIS — Z881 Allergy status to other antibiotic agents status: Secondary | ICD-10-CM | POA: Diagnosis not present

## 2017-09-12 DIAGNOSIS — N136 Pyonephrosis: Secondary | ICD-10-CM | POA: Diagnosis present

## 2017-09-12 DIAGNOSIS — N3289 Other specified disorders of bladder: Secondary | ICD-10-CM | POA: Diagnosis not present

## 2017-09-12 DIAGNOSIS — N390 Urinary tract infection, site not specified: Secondary | ICD-10-CM | POA: Diagnosis present

## 2017-09-12 MED ORDER — LORAZEPAM 1 MG PO TABS
1.0000 mg | ORAL_TABLET | Freq: Four times a day (QID) | ORAL | Status: DC | PRN
  Administered 2017-09-12 – 2017-09-13 (×2): 1 mg via ORAL
  Filled 2017-09-12 (×2): qty 1

## 2017-09-12 MED ORDER — SODIUM CHLORIDE 0.45 % IV SOLN
INTRAVENOUS | Status: DC
  Administered 2017-09-13: 75 mL/h via INTRAVENOUS
  Administered 2017-09-14: 06:00:00 via INTRAVENOUS

## 2017-09-12 NOTE — Progress Notes (Signed)
Patient is admitted for observation for gross hematuria and clot retention.  In the emergency department he had a 24 French three-way Foley catheter placed and he was hand irrigated with more than a liter of normal saline.  A significant amount of blood clot was removed.  Interval: Over the course of the last 24 hours the patient is needed to be hand irrigated by the nursing staff on several occasions.  His CBI was at a slow drip, but his urine was dark.  I hand irrigated him again this morning with more than 2 L of normal saline and again a significant amount of clot was removed.  At the end of the irrigation the urine was remarkably clear.  He was left on a moderate drip with instructions to wean it over the next 18 hours.  Subjective: The patient has had very little complaints.  He denies any nausea or significant pain.  He has had some suprapubic discomfort, especially when being irrigated.  He has had a bowel movement earlier today.  O: Vitals:   09/11/17 1517 09/11/17 1640 09/11/17 2300 09/12/17 0509  BP: 132/75 (!) 128/58 122/70 110/70  Pulse: 79 68 65 66  Resp: 18  18 18   Temp: 98.2 F (36.8 C) 97.7 F (36.5 C) 98.1 F (36.7 C) 97.7 F (36.5 C)  TempSrc: Oral Oral Oral Oral  SpO2: 99%  98% 97%  Weight:  86 kg (189 lb 9.5 oz)    Height:  5\' 9"  (1.753 m)      Intake/Output Summary (Last 24 hours) at 09/12/17 1254 Last data filed at 09/12/17 0900  Gross per 24 hour  Intake             9240 ml  Output            13450 ml  Net            -4210 ml   NAD Abdomen soft 3-way foley draining pink urine on slow CBI gtt   Recent Labs  09/11/17 0732 09/11/17 1535  WBC 14.2*  --   HGB 12.4* 12.1*  HCT 36.8* 35.7*    Recent Labs  09/11/17 0732  NA 137  K 3.8  CL 104  CO2 22  GLUCOSE 128*  BUN 32*  CREATININE 1.46*  CALCIUM 8.8*   No results for input(s): LABPT, INR in the last 72 hours. No results for input(s): PSA in the last 72 hours. No results for input(s):  LABURIN in the last 72 hours. No results found for this or any previous visit.  Imp: Gross hematuria  Plan: Continue with CBI, wean to off.  The patient will be made n.p.o. past midnight in the event that he needs clot evacuation in the operating room tomorrow.

## 2017-09-12 NOTE — Progress Notes (Signed)
Heavy clot load at beginning of shift but it has decreased substantially in the past 12 hours.  Cbi now running at a medium rate to light pink.  Pt is denying discomfort, is up walking with one standby assist and has had a stool this morning.

## 2017-09-12 NOTE — Progress Notes (Signed)
Pt in severe discomfort.  Pale and clammy.  No urine coming from catheter.  Attempted to irrigate without success x2.  Deflated balloon and hand irrigated catheter.  Large clots and bloody sludge obtained.  Irrigant turned back up and catheter flowing freely.  Pt comfortable at present. Andre Lefort

## 2017-09-13 MED ORDER — PIPERACILLIN-TAZOBACTAM 3.375 G IVPB
3.3750 g | Freq: Three times a day (TID) | INTRAVENOUS | Status: DC
  Administered 2017-09-13 – 2017-09-14 (×4): 3.375 g via INTRAVENOUS
  Filled 2017-09-13 (×4): qty 50

## 2017-09-13 MED ORDER — DOCUSATE SODIUM 50 MG PO CAPS
50.0000 mg | ORAL_CAPSULE | Freq: Every day | ORAL | Status: DC | PRN
  Administered 2017-09-13: 50 mg via ORAL
  Filled 2017-09-13: qty 1

## 2017-09-13 MED ORDER — BISACODYL 10 MG RE SUPP: 10.0000 mg | Freq: Every day | RECTAL | Status: DC | PRN

## 2017-09-13 NOTE — Progress Notes (Signed)
Nutrition Brief Note  Patient identified on the Malnutrition Screening Tool (MST) Report  Wt Readings from Last 15 Encounters:  09/11/17 189 lb 9.5 oz (86 kg)  08/16/17 192 lb (87.1 kg)  01/10/16 194 lb 8 oz (88.2 kg)    Body mass index is 28 kg/m. Patient meets criteria for overweight based on current BMI. Pt has lost 3 lbs (1.5% body weight) in the past 1 month; this is not significant for time frame. Skin WDL. Pt admitted for hematuria. He is to be NPO starting at midnight tonight for possible need to go to the OR for clot evacuation.   Current diet order is Regular, patient is consuming approximately 100% of meals at this time. He reports that despite weight loss, noted above, he has not had any changes in appetite or eating patterns.  Medications reviewed; 50 mcg oral Synthroid/day, 40 mg oral Protonix/day. Labs reviewed; BUN: 32 mg/dL, creatinine: 1.46 mg/dL, Ca: 8.8 mg/dL, GFR: 43 mL/min.  IVF: 1/2 NS @ 75 mL/hr.   Per ONS protocol, order for Ensure Enlive BID placed yesterday; each supplement provides 350 kcal and 20 grams of protein. Pt has been accepting this supplement so will maintain order at this time.  No further nutrition interventions warranted at this time. If nutrition issues arise, please consult RD.     Jarome Matin, MS, RD, LDN, [facility] Inpatient Clinical Dietitian Pager # [phone] After hours/weekend pager # [phone]

## 2017-09-13 NOTE — Progress Notes (Signed)
Patient is admitted for observation for gross hematuria and clot retention.  In the emergency department he had a 24 French three-way Foley catheter placed and he was hand irrigated with more than a liter of normal saline.  A significant amount of blood clot was removed.  Interval: Over the course of the last 24 hours the patient is needed to be hand irrigated by the nursing staff on several occasions.  His CBI was at a slow drip, but his urine was dark.  I hand irrigated him again this morning with more than 2 L of normal saline and again a significant amount of clot was removed.  At the end of the irrigation the urine was remarkably clear.  He was left on a moderate drip with instructions to wean it over the next 18 hours.  Subjective: The patient has had very little complaints.  He denies any nausea or significant pain.  He has had some suprapubic discomfort, especially when being irrigated.  Hematuria improved and CBI discontinued  O: Vitals:   09/12/17 0509 09/12/17 1308 09/12/17 2054 09/13/17 0445  BP: 110/70 123/66 117/66 (!) 105/59  Pulse: 66 77 90 72  Resp: 18 16 18 18   Temp: 97.7 F (36.5 C) 97.9 F (36.6 C) 98.4 F (36.9 C) 98.7 F (37.1 C)  TempSrc: Oral Oral Oral Oral  SpO2: 97% 97% 97% 98%  Weight:      Height:        Intake/Output Summary (Last 24 hours) at 09/13/17 1407 Last data filed at 09/13/17 1223  Gross per 24 hour  Intake         14588.75 ml  Output            16700 ml  Net         -2111.25 ml   NAD Abdomen soft 3-way foley draining pink urine on slow CBI gtt   Recent Labs  09/11/17 0732 09/11/17 1535  WBC 14.2*  --   HGB 12.4* 12.1*  HCT 36.8* 35.7*    Recent Labs  09/11/17 0732  NA 137  K 3.8  CL 104  CO2 22  GLUCOSE 128*  BUN 32*  CREATININE 1.46*  CALCIUM 8.8*   No results for input(s): LABPT, INR in the last 72 hours. No results for input(s): PSA in the last 72 hours. No results for input(s): LABURIN in the last 72 hours. No  results found for this or any previous visit.  Imp: Gross hematuria  Plan: 1. Urine for culture 2. Zosyn pending urine culture 3. Continue to hold CBI

## 2017-09-14 DIAGNOSIS — Z23 Encounter for immunization: Secondary | ICD-10-CM | POA: Diagnosis not present

## 2017-09-14 MED ORDER — CEFPODOXIME PROXETIL 200 MG PO TABS: 200.0000 mg | ORAL_TABLET | Freq: Two times a day (BID) | ORAL | 0 refills | Status: DC

## 2017-09-14 MED ORDER — OXYCODONE HCL 5 MG PO TABS: 5.0000 mg | ORAL_TABLET | ORAL | 0 refills | Status: DC | PRN

## 2017-09-14 NOTE — Care Management Note (Signed)
Case Management Note  Patient Details  Name: [name] [name] MRN: [id number] Date of Birth: 1932-10-24  Subjective/Objective:   No CM needs or orders.                 Action/Plan:d/c home.   Expected Discharge Date:  09/14/17               Expected Discharge Plan:  Home/Self Care  In-House Referral:     Discharge planning Services     Post Acute Care Choice:    Choice offered to:     DME Arranged:    DME Agency:     HH Arranged:    HH Agency:     Status of Service:  Completed, signed off  If discussed at [employer] of Stay Meetings, dates discussed:    Additional Comments:  Dessa Phi, RN 09/14/2017, 11:09 AM

## 2017-09-14 NOTE — Discharge Instructions (Signed)

## 2017-09-14 NOTE — Plan of Care (Signed)
Problem: Urinary Elimination: Goal: Ability to avoid or minimize complications of infection will improve Outcome: Completed/Met Date Met: 09/14/17 Pt has voided x 1 so far, will continue to monitor and assess pvr

## 2017-09-14 NOTE — Discharge Summary (Signed)
Physician Discharge Summary  Patient ID: [name] [name] MRN: [id number] DOB/AGE: 1932/04/18 83 y.o.  Admit date: 09/11/2017 Discharge date: 09/14/2017  Admission Diagnoses: Gross hematuria Discharge Diagnoses:  UTI  Discharged Condition: good  Hospital Course: The patient was admitted with gross hematuria and clot retention. He was started on continuous bladder irrigation requiring multiple hand irrigations. The patient became confused on 10/22 and he was started on antibiotics for a presumed UTI. His confusion resolved on 10/23 and his urine became clear.  Prior to discharge the foley was removed, the pt was tolerating a regular diet, pain was controlled on PO pain meds, they were ambulating without difficulty, and they had normal bowel function.  Consults: None  Significant Diagnostic Studies: none  Treatments: continuous bladder irrigation, zosyn  Discharge Exam: Blood pressure (!) 142/61, pulse 79, temperature 97.8 F (36.6 C), temperature source Oral, resp. rate 17, height 5\' 9"  (1.753 m), weight 86 kg (189 lb 9.5 oz), SpO2 99 %. General appearance: alert, cooperative and appears stated age Head: Normocephalic, without obvious abnormality, atraumatic Nose: Nares normal. Septum midline. Mucosa normal. No drainage or sinus tenderness. Neck: no adenopathy, no carotid bruit, no JVD, supple, symmetrical, trachea midline and thyroid not enlarged, symmetric, no tenderness/mass/nodules Resp: clear to auscultation bilaterally Cardio: regular rate and rhythm, S1, S2 normal, no murmur, click, rub or gallop Male genitalia: normal Extremities: extremities normal, atraumatic, no cyanosis or edema Neurologic: Grossly normal  Disposition: 01-Home or Self Care  Discharge Instructions    Discharge patient    Complete by:  As directed    Discharge disposition:  01-Home or Self Care   Discharge patient date:  09/14/2017     Allergies as of 09/14/2017      Reactions   Ciprofloxacin    REACTION: diarrhea.  Occurred with 750mg  dose.  Has since taken 500mg  doses and has had no reaction/diarrhea.   Cephalexin Rash   Rash on arms      Medication List    TAKE these medications   aspirin 81 MG tablet Take 81 mg by mouth every evening.   cefpodoxime 200 MG tablet Commonly known as:  VANTIN Take 1 tablet (200 mg total) by mouth 2 (two) times daily.   finasteride 5 MG tablet Commonly known as:  PROSCAR Take 1 tablet by mouth every evening.   irbesartan 300 MG tablet Commonly known as:  AVAPRO Take 1 tablet by mouth every morning.   levothyroxine 50 MCG tablet Commonly known as:  SYNTHROID, LEVOTHROID Take 1 tablet by mouth daily before breakfast.   oxyCODONE 5 MG immediate release tablet Commonly known as:  Oxy IR/ROXICODONE Take 1 tablet (5 mg total) by mouth every 4 (four) hours as needed for moderate pain. Notes to patient:  Do not take in combination with any other pain medications, over the counter and or prescription.    oxyCODONE-acetaminophen 5-325 MG tablet Commonly known as:  PERCOCET/ROXICET Take 1 tablet by mouth every 4 (four) hours as needed for severe pain.   pantoprazole 40 MG tablet Commonly known as:  PROTONIX Take 1 tablet by mouth at bedtime.   rosuvastatin 5 MG tablet Commonly known as:  CRESTOR Take 5 mg by mouth at bedtime.      Follow-up Information    McKenzie, Candee Furbish, MD. Call on 09/20/2017.   Specialty:  Urology Why:  pt scheduled for surgery Contact information: [place] [address] [phone]           Signed: Nicolette Bang  09/14/2017, 9:12 PM

## 2017-09-14 NOTE — Progress Notes (Signed)
Patient remains alert, oriented x 4,ambulatory without assist. Discharge instructions reviewed, questions concerns denied.

## 2017-09-14 NOTE — Progress Notes (Signed)
Foley cathter discontinued, pt tolerated well. Urine was clear yellow. Will continue to monitor.

## 2017-09-17 DIAGNOSIS — R8271 Bacteriuria: Secondary | ICD-10-CM | POA: Diagnosis not present

## 2017-09-17 DIAGNOSIS — R310 Gross hematuria: Secondary | ICD-10-CM | POA: Diagnosis not present

## 2017-09-20 ENCOUNTER — Encounter (HOSPITAL_BASED_OUTPATIENT_CLINIC_OR_DEPARTMENT_OTHER): Payer: Self-pay | Admitting: *Deleted

## 2017-09-20 ENCOUNTER — Ambulatory Visit (HOSPITAL_BASED_OUTPATIENT_CLINIC_OR_DEPARTMENT_OTHER)
Admission: RE | Admit: 2017-09-20 | Discharge: 2017-09-20 | Disposition: A | Discharge status: Home / Self Care | Payer: Medicare Other | Source: Ambulatory Visit | Attending: Urology | Admitting: Urology

## 2017-09-20 ENCOUNTER — Ambulatory Visit (HOSPITAL_BASED_OUTPATIENT_CLINIC_OR_DEPARTMENT_OTHER): Payer: Medicare Other | Admitting: Anesthesiology

## 2017-09-20 ENCOUNTER — Encounter (HOSPITAL_BASED_OUTPATIENT_CLINIC_OR_DEPARTMENT_OTHER)
Admission: RE | Disposition: A | Discharge status: Home / Self Care | Payer: Self-pay | Source: Ambulatory Visit | Attending: Urology

## 2017-09-20 DIAGNOSIS — I10 Essential (primary) hypertension: Secondary | ICD-10-CM | POA: Diagnosis not present

## 2017-09-20 DIAGNOSIS — C679 Malignant neoplasm of bladder, unspecified: Secondary | ICD-10-CM | POA: Diagnosis not present

## 2017-09-20 DIAGNOSIS — C678 Malignant neoplasm of overlapping sites of bladder: Secondary | ICD-10-CM | POA: Diagnosis not present

## 2017-09-20 DIAGNOSIS — Z8551 Personal history of malignant neoplasm of bladder: Secondary | ICD-10-CM | POA: Insufficient documentation

## 2017-09-20 DIAGNOSIS — K219 Gastro-esophageal reflux disease without esophagitis: Secondary | ICD-10-CM | POA: Diagnosis not present

## 2017-09-20 DIAGNOSIS — R310 Gross hematuria: Secondary | ICD-10-CM | POA: Diagnosis not present

## 2017-09-20 DIAGNOSIS — N3091 Cystitis, unspecified with hematuria: Secondary | ICD-10-CM | POA: Insufficient documentation

## 2017-09-20 DIAGNOSIS — N3090 Cystitis, unspecified without hematuria: Secondary | ICD-10-CM | POA: Diagnosis not present

## 2017-09-20 HISTORY — DX: Benign prostatic hyperplasia without lower urinary tract symptoms: N40.0

## 2017-09-20 HISTORY — DX: Elevated prostate specific antigen (PSA): R97.20

## 2017-09-20 HISTORY — DX: Malignant neoplasm of bladder, unspecified: C67.9

## 2017-09-20 HISTORY — DX: Hematuria, unspecified: R31.9

## 2017-09-20 HISTORY — DX: Other specified postprocedural states: Z98.890

## 2017-09-20 HISTORY — PX: TRANSURETHRAL RESECTION OF BLADDER TUMOR: SHX2575

## 2017-09-20 HISTORY — DX: Personal history of colon polyps, unspecified: Z86.0100

## 2017-09-20 HISTORY — DX: Personal history of colonic polyps: Z86.010

## 2017-09-20 LAB — POCT I-STAT, CHEM 8
BUN: 30 mg/dL — ABNORMAL HIGH (ref 6–20)
Calcium, Ion: 1.21 mmol/L (ref 1.15–1.40)
Chloride: 106 mmol/L (ref 101–111)
Creatinine, Ser: 1.5 mg/dL — ABNORMAL HIGH (ref 0.61–1.24)
Glucose, Bld: 114 mg/dL — ABNORMAL HIGH (ref 65–99)
HCT: 34 % — ABNORMAL LOW (ref 39.0–52.0)
Hemoglobin: 11.6 g/dL — ABNORMAL LOW (ref 13.0–17.0)
Potassium: 4.3 mmol/L (ref 3.5–5.1)
Sodium: 142 mmol/L (ref 135–145)
TCO2: 24 mmol/L (ref 22–32)

## 2017-09-20 SURGERY — TURBT (TRANSURETHRAL RESECTION OF BLADDER TUMOR)
Anesthesia: General

## 2017-09-20 MED ORDER — LIDOCAINE 2% (20 MG/ML) 5 ML SYRINGE
INTRAMUSCULAR | Status: DC | PRN
  Administered 2017-09-20: 80 mg via INTRAVENOUS

## 2017-09-20 MED ORDER — OXYCODONE HCL 5 MG PO TABS
5.0000 mg | ORAL_TABLET | Freq: Once | ORAL | Status: AC | PRN
  Administered 2017-09-20: 5 mg via ORAL
  Filled 2017-09-20: qty 1

## 2017-09-20 MED ORDER — SUGAMMADEX SODIUM 200 MG/2ML IV SOLN
INTRAVENOUS | Status: DC | PRN
  Administered 2017-09-20: 170 mg via INTRAVENOUS

## 2017-09-20 MED ORDER — PROPOFOL 10 MG/ML IV BOLUS
INTRAVENOUS | Status: AC
  Filled 2017-09-20: qty 20

## 2017-09-20 MED ORDER — OXYCODONE-ACETAMINOPHEN 5-325 MG PO TABS: 1.0000 | ORAL_TABLET | ORAL | 0 refills | Status: DC | PRN

## 2017-09-20 MED ORDER — FENTANYL CITRATE (PF) 100 MCG/2ML IJ SOLN
INTRAMUSCULAR | Status: AC
  Filled 2017-09-20: qty 2

## 2017-09-20 MED ORDER — ONDANSETRON HCL 4 MG/2ML IJ SOLN
INTRAMUSCULAR | Status: DC | PRN
  Administered 2017-09-20: 4 mg via INTRAVENOUS

## 2017-09-20 MED ORDER — DEXTROSE 5 % IV SOLN
INTRAVENOUS | Status: AC
  Filled 2017-09-20: qty 50

## 2017-09-20 MED ORDER — FENTANYL CITRATE (PF) 100 MCG/2ML IJ SOLN
INTRAMUSCULAR | Status: DC | PRN
  Administered 2017-09-20 (×2): 50 ug via INTRAVENOUS

## 2017-09-20 MED ORDER — SUCCINYLCHOLINE CHLORIDE 200 MG/10ML IV SOSY
PREFILLED_SYRINGE | INTRAVENOUS | Status: DC | PRN
  Administered 2017-09-20: 100 mg via INTRAVENOUS

## 2017-09-20 MED ORDER — KETOROLAC TROMETHAMINE 30 MG/ML IJ SOLN
15.0000 mg | Freq: Once | INTRAMUSCULAR | Status: DC | PRN
Start: 2017-09-20 — End: 2017-09-20
  Filled 2017-09-20: qty 1

## 2017-09-20 MED ORDER — DEXAMETHASONE SODIUM PHOSPHATE 10 MG/ML IJ SOLN
INTRAMUSCULAR | Status: DC | PRN
  Administered 2017-09-20: 10 mg via INTRAVENOUS

## 2017-09-20 MED ORDER — CEFAZOLIN SODIUM-DEXTROSE 2-4 GM/100ML-% IV SOLN
INTRAVENOUS | Status: AC
  Filled 2017-09-20: qty 100

## 2017-09-20 MED ORDER — FENTANYL CITRATE (PF) 100 MCG/2ML IJ SOLN
25.0000 ug | INTRAMUSCULAR | Status: DC | PRN
  Administered 2017-09-20: 25 ug via INTRAVENOUS
  Filled 2017-09-20: qty 1

## 2017-09-20 MED ORDER — SODIUM CHLORIDE 0.9 % IV SOLN
INTRAVENOUS | Status: DC
  Administered 2017-09-20: 14:00:00 via INTRAVENOUS
  Filled 2017-09-20: qty 1000

## 2017-09-20 MED ORDER — CEFTRIAXONE SODIUM 2 G IJ SOLR
INTRAMUSCULAR | Status: AC
  Filled 2017-09-20: qty 2

## 2017-09-20 MED ORDER — OXYCODONE HCL 5 MG/5ML PO SOLN
5.0000 mg | Freq: Once | ORAL | Status: AC | PRN
  Filled 2017-09-20: qty 5

## 2017-09-20 MED ORDER — PROPOFOL 10 MG/ML IV BOLUS
INTRAVENOUS | Status: DC | PRN
  Administered 2017-09-20: 150 mg via INTRAVENOUS

## 2017-09-20 MED ORDER — ROCURONIUM BROMIDE 50 MG/5ML IV SOSY
PREFILLED_SYRINGE | INTRAVENOUS | Status: AC
  Filled 2017-09-20: qty 5

## 2017-09-20 MED ORDER — SUCCINYLCHOLINE CHLORIDE 200 MG/10ML IV SOSY
PREFILLED_SYRINGE | INTRAVENOUS | Status: AC
  Filled 2017-09-20: qty 10

## 2017-09-20 MED ORDER — DEXTROSE 5 % IV SOLN
INTRAVENOUS | Status: DC | PRN
  Administered 2017-09-20: 2 g via INTRAVENOUS

## 2017-09-20 MED ORDER — ONDANSETRON HCL 4 MG/2ML IJ SOLN
INTRAMUSCULAR | Status: AC
  Filled 2017-09-20: qty 2

## 2017-09-20 MED ORDER — ROCURONIUM BROMIDE 10 MG/ML (PF) SYRINGE
PREFILLED_SYRINGE | INTRAVENOUS | Status: DC | PRN
  Administered 2017-09-20: 15 mg via INTRAVENOUS

## 2017-09-20 MED ORDER — CEFAZOLIN SODIUM-DEXTROSE 2-4 GM/100ML-% IV SOLN
2.0000 g | INTRAVENOUS | Status: DC
  Filled 2017-09-20: qty 100

## 2017-09-20 MED ORDER — LACTATED RINGERS IV SOLN
INTRAVENOUS | Status: DC
  Administered 2017-09-20: 14:00:00 via INTRAVENOUS
  Filled 2017-09-20: qty 1000

## 2017-09-20 MED ORDER — ACETAMINOPHEN 10 MG/ML IV SOLN
1000.0000 mg | Freq: Once | INTRAVENOUS | Status: AC
  Administered 2017-09-20: 1000 mg via INTRAVENOUS
  Filled 2017-09-20: qty 100

## 2017-09-20 MED ORDER — PROMETHAZINE HCL 25 MG/ML IJ SOLN
6.2500 mg | INTRAMUSCULAR | Status: DC | PRN
  Filled 2017-09-20: qty 1

## 2017-09-20 MED ORDER — DEXAMETHASONE SODIUM PHOSPHATE 10 MG/ML IJ SOLN
INTRAMUSCULAR | Status: AC
  Filled 2017-09-20: qty 1

## 2017-09-20 SURGICAL SUPPLY — 21 items
BAG DRAIN URO-CYSTO SKYTR STRL (DRAIN) ×3 IMPLANT
BAG DRN ANRFLXCHMBR STRAP LEK (BAG)
BAG DRN UROCATH (DRAIN) ×1
BAG URINE DRAINAGE (UROLOGICAL SUPPLIES) ×2 IMPLANT
BAG URINE LEG 19OZ MD ST LTX (BAG) IMPLANT
CATH FOLEY 3WAY 30CC 22F (CATHETERS) ×3 IMPLANT
CLOTH BEACON ORANGE TIMEOUT ST (SAFETY) ×3 IMPLANT
ELECT REM PT RETURN 9FT ADLT (ELECTROSURGICAL) ×3
ELECTRODE REM PT RTRN 9FT ADLT (ELECTROSURGICAL) ×1 IMPLANT
EVACUATOR MICROVAS BLADDER (UROLOGICAL SUPPLIES) IMPLANT
GLOVE BIO SURGEON STRL SZ8 (GLOVE) ×3 IMPLANT
GOWN STRL REUS W/TWL LRG LVL3 (GOWN DISPOSABLE) ×3 IMPLANT
GOWN STRL REUS W/TWL XL LVL3 (GOWN DISPOSABLE) ×3 IMPLANT
KIT RM TURNOVER CYSTO AR (KITS) ×3 IMPLANT
MANIFOLD NEPTUNE II (INSTRUMENTS) ×2 IMPLANT
PACK CYSTO (CUSTOM PROCEDURE TRAY) ×3 IMPLANT
PLUG CATH AND CAP STER (CATHETERS) IMPLANT
SYR 30ML LL (SYRINGE) ×3 IMPLANT
SYRINGE IRR TOOMEY STRL 70CC (SYRINGE) IMPLANT
TUBE CONNECTING 12'X1/4 (SUCTIONS) ×1
TUBE CONNECTING 12X1/4 (SUCTIONS) ×1 IMPLANT

## 2017-09-20 NOTE — H&P (View-Only) (Signed)
Patient is admitted for observation for gross hematuria and clot retention.  In the emergency department he had a 24 French three-way Foley catheter placed and he was hand irrigated with more than a liter of normal saline.  A significant amount of blood clot was removed.  Interval: Over the course of the last 24 hours the patient is needed to be hand irrigated by the nursing staff on several occasions.  His CBI was at a slow drip, but his urine was dark.  I hand irrigated him again this morning with more than 2 L of normal saline and again a significant amount of clot was removed.  At the end of the irrigation the urine was remarkably clear.  He was left on a moderate drip with instructions to wean it over the next 18 hours.  Subjective: The patient has had very little complaints.  He denies any nausea or significant pain.  He has had some suprapubic discomfort, especially when being irrigated.  Hematuria improved and CBI discontinued  O: Vitals:   09/12/17 0509 09/12/17 1308 09/12/17 2054 09/13/17 0445  BP: 110/70 123/66 117/66 (!) 105/59  Pulse: 66 77 90 72  Resp: 18 16 18 18   Temp: 97.7 F (36.5 C) 97.9 F (36.6 C) 98.4 F (36.9 C) 98.7 F (37.1 C)  TempSrc: Oral Oral Oral Oral  SpO2: 97% 97% 97% 98%  Weight:      Height:        Intake/Output Summary (Last 24 hours) at 09/13/17 1407 Last data filed at 09/13/17 1223  Gross per 24 hour  Intake         14588.75 ml  Output            16700 ml  Net         -2111.25 ml   NAD Abdomen soft 3-way foley draining pink urine on slow CBI gtt   Recent Labs  09/11/17 0732 09/11/17 1535  WBC 14.2*  --   HGB 12.4* 12.1*  HCT 36.8* 35.7*    Recent Labs  09/11/17 0732  NA 137  K 3.8  CL 104  CO2 22  GLUCOSE 128*  BUN 32*  CREATININE 1.46*  CALCIUM 8.8*   No results for input(s): LABPT, INR in the last 72 hours. No results for input(s): PSA in the last 72 hours. No results for input(s): LABURIN in the last 72 hours. No  results found for this or any previous visit.  Imp: Gross hematuria  Plan: 1. Urine for culture 2. Zosyn pending urine culture 3. Continue to hold CBI

## 2017-09-20 NOTE — Anesthesia Postprocedure Evaluation (Signed)
Anesthesia Post Note  Patient: Katherine Syme Golla  Procedure(s) Performed: TRANSURETHRAL RESECTION OF BLADDER TUMOR (TURBT) (N/A )     Patient location during evaluation: PACU Anesthesia Type: General Level of consciousness: awake and alert Pain management: pain level controlled Vital Signs Assessment: post-procedure vital signs reviewed and stable Respiratory status: spontaneous breathing, nonlabored ventilation, respiratory function stable and patient connected to nasal cannula oxygen Cardiovascular status: blood pressure returned to baseline and stable Postop Assessment: no apparent nausea or vomiting Anesthetic complications: no    Last Vitals:  Vitals:   09/20/17 1530 09/20/17 1545  BP: (!) 146/79 137/77  Pulse: 67 72  Resp: 10 10  Temp:    SpO2: 98% 98%    Last Pain:  Vitals:   09/20/17 1545  TempSrc:   PainSc: 2                  Effie Berkshire

## 2017-09-20 NOTE — Anesthesia Preprocedure Evaluation (Signed)
Anesthesia Evaluation  Patient identified by MRN, date of birth, ID band Patient awake    Reviewed: Allergy & Precautions, NPO status , Patient's Chart, lab work & pertinent test results  Airway Mallampati: II  TM Distance: >3 FB Neck ROM: Full    Dental no notable dental hx.    Pulmonary neg pulmonary ROS,    Pulmonary exam normal breath sounds clear to auscultation       Cardiovascular hypertension, Normal cardiovascular exam Rhythm:Regular Rate:Normal     Neuro/Psych negative neurological ROS  negative psych ROS   GI/Hepatic Neg liver ROS, GERD  ,  Endo/Other  negative endocrine ROS  Renal/GU negative Renal ROS  negative genitourinary   Musculoskeletal negative musculoskeletal ROS (+)   Abdominal   Peds negative pediatric ROS (+)  Hematology negative hematology ROS (+)   Anesthesia Other Findings   Reproductive/Obstetrics negative OB ROS                             Anesthesia Physical Anesthesia Plan  ASA: II  Anesthesia Plan: General   Post-op Pain Management:    Induction: Intravenous  PONV Risk Score and Plan: 2 and Ondansetron and Dexamethasone  Airway Management Planned: Oral ETT  Additional Equipment:   Intra-op Plan:   Post-operative Plan: Extubation in OR  Informed Consent: I have reviewed the patients History and Physical, chart, labs and discussed the procedure including the risks, benefits and alternatives for the proposed anesthesia with the patient or authorized representative who has indicated his/her understanding and acceptance.   Dental advisory given  Plan Discussed with: CRNA and Surgeon  Anesthesia Plan Comments:         Anesthesia Quick Evaluation

## 2017-09-20 NOTE — Transfer of Care (Signed)
   Last Vitals:  Vitals:   09/20/17 1250  BP: (!) 151/69  Pulse: 86  Resp: 16  Temp: 36.5 C  SpO2: 100%    Last Pain:  Vitals:   09/20/17 1303  TempSrc:   PainSc: 6          Immediate Anesthesia Transfer of Care Note  Patient: [name] [name]  Procedure(s) Performed: Procedure(s) (LRB): TRANSURETHRAL RESECTION OF BLADDER TUMOR (TURBT) (N/A)  Patient Location: PACU  Anesthesia Type: General  Level of Consciousness: awake, alert  and oriented  Airway & Oxygen Therapy: Patient Spontanous Breathing and Patient connected to nasal cannula oxygen  Post-op Assessment: Report given to PACU RN and Post -op Vital signs reviewed and stable  Post vital signs: Reviewed and stable  Complications: No apparent anesthesia complications

## 2017-09-20 NOTE — Discharge Instructions (Signed)

## 2017-09-20 NOTE — Anesthesia Procedure Notes (Signed)
Procedure Name: Intubation Date/Time: 09/20/2017 2:12 PM Performed by: Myrtie Soman Pre-anesthesia Checklist: Patient identified, Emergency Drugs available, Suction available and Patient being monitored Patient Re-evaluated:Patient Re-evaluated prior to induction Oxygen Delivery Method: Circle system utilized Preoxygenation: Pre-oxygenation with 100% oxygen Induction Type: IV induction Ventilation: Mask ventilation without difficulty Laryngoscope Size: Mac and 4 Grade View: Grade II Tube type: Oral Tube size: 8.0 mm Number of attempts: 1 Airway Equipment and Method: Stylet Placement Confirmation: ETT inserted through vocal cords under direct vision,  positive ETCO2 and breath sounds checked- equal and bilateral Secured at: 23 cm Tube secured with: Tape Dental Injury: Teeth and Oropharynx as per pre-operative assessment  Comments: Easy mask airway.  Dr. Kalman Shan held back of head up and gave cricoid pressure for a good view of cords.

## 2017-09-20 NOTE — Op Note (Signed)
Preoperative diagnosis: Bladder cancer  Postop diagnosis: Same  Procedure: 1.  Cystoscopy 2. Bilateral retrograde pyelography 3. Intra-operative fluoroscopy, under 1 hour, with interpretation 4.Transurethral resection of bladder tumor, medium 5. Right ureteral Stent removal   Attending: Nicolette Bang  Anesthesia: General  Estimated blood loss: 5 cc  Drains: 1. 22 French Foley catheter   Specimens: Bladder tumor  Antibiotics: Ancef  Findings: 3 areas at the dome, p[osterior and right lateral wall with papillary tumor greater than 1cm each.  Indications: Patient is a 83 year old with a history of high grade bladder cancer who is here for repeat resection.   After discussing treatment options patient decided to proceed with transurethral resection of bladder tumor  Procedure in detail: Prior to procedure consetn was obtained. Patient was brought to the operating room and briefing was done sure correct patient, correct procedure, correct site.  General anesthesia was in administered patient was placed in the dorsal lithotomy position.  The rigid 8 French cystoscope was passed urethra and bladder.  Bladder was inspected masses or lesions and we noted a diffuse 1 centimeter lesions on the right lateral wall, posterior wall and dome.  Using a grasper the right ureteral stent was removed. We then removed the cystoscope and placed a 27 French resectoscope in the bladder.  Using bipolar electrocautery were then removed the 3 bladder tumors.  We removed the bladder tumors down to the base exposing muscle.  We then removed the pieces and sent them for pathology.  To obtain hemostasis we then cauterized the bed of the tumors.  Once good hemostasis was noted the bladder was then drained and a 22 French Foley catheter was placed. This concluded the procedure which resulted by the patient.  Complications: None  Condition: Stable,  extubated, transferred to PACU.  Plan: The patient is to be  discharged home and followup in 5 days for a voiding trial

## 2017-09-20 NOTE — Interval H&P Note (Signed)
History and Physical Interval Note:  09/20/2017 1:54 PM  [name] [name]  has presented today for surgery, with the diagnosis of BLADDER CANCER  The various methods of treatment have been discussed with the patient and family. After consideration of risks, benefits and other options for treatment, the patient has consented to  Procedure(s): TRANSURETHRAL RESECTION OF BLADDER TUMOR (TURBT) (N/A) as a surgical intervention .  The patient's history has been reviewed, patient examined, no change in status, stable for surgery.  I have reviewed the patient's chart and labs.  Questions were answered to the patient's satisfaction.     Nicolette Bang

## 2017-09-21 ENCOUNTER — Encounter (HOSPITAL_BASED_OUTPATIENT_CLINIC_OR_DEPARTMENT_OTHER): Payer: Self-pay | Admitting: Urology

## 2017-09-23 DIAGNOSIS — C678 Malignant neoplasm of overlapping sites of bladder: Secondary | ICD-10-CM | POA: Diagnosis not present

## 2017-09-24 DIAGNOSIS — R339 Retention of urine, unspecified: Secondary | ICD-10-CM | POA: Diagnosis not present

## 2017-09-30 DIAGNOSIS — N401 Enlarged prostate with lower urinary tract symptoms: Secondary | ICD-10-CM | POA: Diagnosis not present

## 2017-09-30 DIAGNOSIS — C678 Malignant neoplasm of overlapping sites of bladder: Secondary | ICD-10-CM | POA: Diagnosis not present

## 2017-09-30 DIAGNOSIS — R350 Frequency of micturition: Secondary | ICD-10-CM | POA: Diagnosis not present

## 2017-10-20 DIAGNOSIS — Z5111 Encounter for antineoplastic chemotherapy: Secondary | ICD-10-CM | POA: Diagnosis not present

## 2017-10-20 DIAGNOSIS — C678 Malignant neoplasm of overlapping sites of bladder: Secondary | ICD-10-CM | POA: Diagnosis not present

## 2017-10-27 DIAGNOSIS — Z5111 Encounter for antineoplastic chemotherapy: Secondary | ICD-10-CM | POA: Diagnosis not present

## 2017-10-27 DIAGNOSIS — C678 Malignant neoplasm of overlapping sites of bladder: Secondary | ICD-10-CM | POA: Diagnosis not present

## 2017-11-03 DIAGNOSIS — Z5111 Encounter for antineoplastic chemotherapy: Secondary | ICD-10-CM | POA: Diagnosis not present

## 2017-11-03 DIAGNOSIS — C678 Malignant neoplasm of overlapping sites of bladder: Secondary | ICD-10-CM | POA: Diagnosis not present

## 2017-11-10 DIAGNOSIS — Z5111 Encounter for antineoplastic chemotherapy: Secondary | ICD-10-CM | POA: Diagnosis not present

## 2017-11-10 DIAGNOSIS — C678 Malignant neoplasm of overlapping sites of bladder: Secondary | ICD-10-CM | POA: Diagnosis not present

## 2017-11-17 DIAGNOSIS — C678 Malignant neoplasm of overlapping sites of bladder: Secondary | ICD-10-CM | POA: Diagnosis not present

## 2017-11-17 DIAGNOSIS — Z5111 Encounter for antineoplastic chemotherapy: Secondary | ICD-10-CM | POA: Diagnosis not present

## 2017-11-24 DIAGNOSIS — C678 Malignant neoplasm of overlapping sites of bladder: Secondary | ICD-10-CM | POA: Diagnosis not present

## 2017-11-24 DIAGNOSIS — Z5111 Encounter for antineoplastic chemotherapy: Secondary | ICD-10-CM | POA: Diagnosis not present

## 2017-12-14 DIAGNOSIS — H2513 Age-related nuclear cataract, bilateral: Secondary | ICD-10-CM | POA: Diagnosis not present

## 2017-12-14 DIAGNOSIS — H04123 Dry eye syndrome of bilateral lacrimal glands: Secondary | ICD-10-CM | POA: Diagnosis not present

## 2017-12-14 DIAGNOSIS — H5203 Hypermetropia, bilateral: Secondary | ICD-10-CM | POA: Diagnosis not present

## 2017-12-14 DIAGNOSIS — H25013 Cortical age-related cataract, bilateral: Secondary | ICD-10-CM | POA: Diagnosis not present

## 2017-12-16 DIAGNOSIS — I10 Essential (primary) hypertension: Secondary | ICD-10-CM | POA: Diagnosis not present

## 2017-12-16 DIAGNOSIS — E039 Hypothyroidism, unspecified: Secondary | ICD-10-CM | POA: Diagnosis not present

## 2017-12-16 DIAGNOSIS — E7800 Pure hypercholesterolemia, unspecified: Secondary | ICD-10-CM | POA: Diagnosis not present

## 2017-12-16 DIAGNOSIS — N183 Chronic kidney disease, stage 3 (moderate): Secondary | ICD-10-CM | POA: Diagnosis not present

## 2017-12-16 DIAGNOSIS — Z23 Encounter for immunization: Secondary | ICD-10-CM | POA: Diagnosis not present

## 2017-12-23 DIAGNOSIS — H2512 Age-related nuclear cataract, left eye: Secondary | ICD-10-CM | POA: Diagnosis not present

## 2017-12-23 DIAGNOSIS — H25012 Cortical age-related cataract, left eye: Secondary | ICD-10-CM | POA: Diagnosis not present

## 2017-12-23 DIAGNOSIS — H25812 Combined forms of age-related cataract, left eye: Secondary | ICD-10-CM | POA: Diagnosis not present

## 2018-01-13 DIAGNOSIS — H25012 Cortical age-related cataract, left eye: Secondary | ICD-10-CM | POA: Diagnosis not present

## 2018-01-13 DIAGNOSIS — H25811 Combined forms of age-related cataract, right eye: Secondary | ICD-10-CM | POA: Diagnosis not present

## 2018-01-13 DIAGNOSIS — H2512 Age-related nuclear cataract, left eye: Secondary | ICD-10-CM | POA: Diagnosis not present

## 2018-01-25 DIAGNOSIS — C678 Malignant neoplasm of overlapping sites of bladder: Secondary | ICD-10-CM | POA: Diagnosis not present

## 2018-02-04 DIAGNOSIS — E7800 Pure hypercholesterolemia, unspecified: Secondary | ICD-10-CM | POA: Diagnosis not present

## 2018-02-04 DIAGNOSIS — I10 Essential (primary) hypertension: Secondary | ICD-10-CM | POA: Diagnosis not present

## 2018-02-04 DIAGNOSIS — Z0000 Encounter for general adult medical examination without abnormal findings: Secondary | ICD-10-CM | POA: Diagnosis not present

## 2018-02-04 DIAGNOSIS — D649 Anemia, unspecified: Secondary | ICD-10-CM | POA: Diagnosis not present

## 2018-02-04 DIAGNOSIS — E039 Hypothyroidism, unspecified: Secondary | ICD-10-CM | POA: Diagnosis not present

## 2018-02-04 DIAGNOSIS — R7309 Other abnormal glucose: Secondary | ICD-10-CM | POA: Diagnosis not present

## 2018-02-04 DIAGNOSIS — N183 Chronic kidney disease, stage 3 (moderate): Secondary | ICD-10-CM | POA: Diagnosis not present

## 2018-02-10 DIAGNOSIS — H6123 Impacted cerumen, bilateral: Secondary | ICD-10-CM | POA: Diagnosis not present

## 2018-02-10 DIAGNOSIS — I10 Essential (primary) hypertension: Secondary | ICD-10-CM | POA: Diagnosis not present

## 2018-02-10 DIAGNOSIS — Z961 Presence of intraocular lens: Secondary | ICD-10-CM | POA: Diagnosis not present

## 2018-02-10 DIAGNOSIS — N183 Chronic kidney disease, stage 3 (moderate): Secondary | ICD-10-CM | POA: Diagnosis not present

## 2018-02-10 DIAGNOSIS — E7800 Pure hypercholesterolemia, unspecified: Secondary | ICD-10-CM | POA: Diagnosis not present

## 2018-02-10 DIAGNOSIS — R7309 Other abnormal glucose: Secondary | ICD-10-CM | POA: Diagnosis not present

## 2018-02-10 DIAGNOSIS — I129 Hypertensive chronic kidney disease with stage 1 through stage 4 chronic kidney disease, or unspecified chronic kidney disease: Secondary | ICD-10-CM | POA: Diagnosis not present

## 2018-04-27 DIAGNOSIS — H903 Sensorineural hearing loss, bilateral: Secondary | ICD-10-CM | POA: Diagnosis not present

## 2018-05-03 DIAGNOSIS — C678 Malignant neoplasm of overlapping sites of bladder: Secondary | ICD-10-CM | POA: Diagnosis not present

## 2018-06-01 DIAGNOSIS — I10 Essential (primary) hypertension: Secondary | ICD-10-CM | POA: Diagnosis not present

## 2018-06-01 DIAGNOSIS — R7309 Other abnormal glucose: Secondary | ICD-10-CM | POA: Diagnosis not present

## 2018-06-01 DIAGNOSIS — D508 Other iron deficiency anemias: Secondary | ICD-10-CM | POA: Diagnosis not present

## 2018-06-01 DIAGNOSIS — E7800 Pure hypercholesterolemia, unspecified: Secondary | ICD-10-CM | POA: Diagnosis not present

## 2018-06-06 DIAGNOSIS — R7309 Other abnormal glucose: Secondary | ICD-10-CM | POA: Diagnosis not present

## 2018-06-06 DIAGNOSIS — E7800 Pure hypercholesterolemia, unspecified: Secondary | ICD-10-CM | POA: Diagnosis not present

## 2018-06-06 DIAGNOSIS — E039 Hypothyroidism, unspecified: Secondary | ICD-10-CM | POA: Diagnosis not present

## 2018-06-06 DIAGNOSIS — N183 Chronic kidney disease, stage 3 (moderate): Secondary | ICD-10-CM | POA: Diagnosis not present

## 2018-06-06 DIAGNOSIS — I10 Essential (primary) hypertension: Secondary | ICD-10-CM | POA: Diagnosis not present

## 2018-08-05 DIAGNOSIS — C678 Malignant neoplasm of overlapping sites of bladder: Secondary | ICD-10-CM | POA: Diagnosis not present

## 2018-08-05 DIAGNOSIS — R3912 Poor urinary stream: Secondary | ICD-10-CM | POA: Diagnosis not present

## 2018-08-14 ENCOUNTER — Other Ambulatory Visit: Payer: Self-pay

## 2018-08-14 ENCOUNTER — Emergency Department (HOSPITAL_COMMUNITY)
Admission: EM | Admit: 2018-08-14 | Discharge: 2018-08-14 | Disposition: A | Discharge status: Home / Self Care | Payer: Medicare Other | Attending: Emergency Medicine | Admitting: Emergency Medicine

## 2018-08-14 ENCOUNTER — Encounter (HOSPITAL_COMMUNITY): Payer: Self-pay | Admitting: Pharmacy Technician

## 2018-08-14 ENCOUNTER — Emergency Department (HOSPITAL_COMMUNITY): Payer: Medicare Other

## 2018-08-14 DIAGNOSIS — R000 Tachycardia, unspecified: Secondary | ICD-10-CM | POA: Diagnosis not present

## 2018-08-14 DIAGNOSIS — E039 Hypothyroidism, unspecified: Secondary | ICD-10-CM | POA: Diagnosis not present

## 2018-08-14 DIAGNOSIS — I10 Essential (primary) hypertension: Secondary | ICD-10-CM | POA: Diagnosis not present

## 2018-08-14 DIAGNOSIS — R0600 Dyspnea, unspecified: Secondary | ICD-10-CM

## 2018-08-14 DIAGNOSIS — R0609 Other forms of dyspnea: Secondary | ICD-10-CM | POA: Insufficient documentation

## 2018-08-14 DIAGNOSIS — R002 Palpitations: Secondary | ICD-10-CM | POA: Insufficient documentation

## 2018-08-14 DIAGNOSIS — J80 Acute respiratory distress syndrome: Secondary | ICD-10-CM | POA: Diagnosis not present

## 2018-08-14 DIAGNOSIS — R0602 Shortness of breath: Secondary | ICD-10-CM | POA: Diagnosis not present

## 2018-08-14 LAB — CBC
HCT: 45.8 % (ref 39.0–52.0)
Hemoglobin: 14.7 g/dL (ref 13.0–17.0)
MCH: 29.7 pg (ref 26.0–34.0)
MCHC: 32.1 g/dL (ref 30.0–36.0)
MCV: 92.5 fL (ref 78.0–100.0)
Platelets: 281 10*3/uL (ref 150–400)
RBC: 4.95 MIL/uL (ref 4.22–5.81)
RDW: 14 % (ref 11.5–15.5)
WBC: 7.5 10*3/uL (ref 4.0–10.5)

## 2018-08-14 LAB — TROPONIN I: Troponin I: <0.03 ng/mL (ref <0.03)

## 2018-08-14 LAB — BASIC METABOLIC PANEL
Anion gap: 12 (ref 5–15)
BUN: 32 mg/dL — ABNORMAL HIGH (ref 8–23)
CO2: 20 mmol/L — ABNORMAL LOW (ref 22–32)
Calcium: 9.2 mg/dL (ref 8.9–10.3)
Chloride: 106 mmol/L (ref 98–111)
Creatinine, Ser: 1.94 mg/dL — ABNORMAL HIGH (ref 0.61–1.24)
GFR calc Af Amer: 35 mL/min — ABNORMAL LOW (ref >60)
GFR calc non Af Amer: 30 mL/min — ABNORMAL LOW (ref >60)
Glucose, Bld: 193 mg/dL — ABNORMAL HIGH (ref 70–99)
Potassium: 4.2 mmol/L (ref 3.5–5.1)
Sodium: 138 mmol/L (ref 135–145)

## 2018-08-14 LAB — D-DIMER, QUANTITATIVE: D-Dimer, Quant: 0.43 ug/mL-FEU (ref 0.00–0.50)

## 2018-08-14 MED ORDER — SODIUM CHLORIDE 0.9 % IV BOLUS
1000.0000 mL | Freq: Once | INTRAVENOUS | Status: AC
  Administered 2018-08-14: 1000 mL via INTRAVENOUS

## 2018-08-14 NOTE — Discharge Instructions (Addendum)
You were evaluated in the Emergency Department and after careful evaluation, we did not find any emergent condition requiring admission or further testing in the hospital. ° °Please return to the Emergency Department if you experience any worsening of your condition.  We encourage you to follow up with a primary care provider.  Thank you for allowing us to be a part of your care. °

## 2018-08-14 NOTE — ED Provider Notes (Signed)
[facility] Emergency Department Provider Note MRN:  [id number]  Arrival date & time: 08/14/18     Chief Complaint   Palpitations   History of Present Illness   [name] [name] is a 84 y.o. year-old male with a history of bladder cancer presenting to the ED with chief complaint of palpitations.  Patient explains that he has intermittent palpitations that he relates to panic attacks, has had 4-5 episodes in the past month.  Today at around midday, he climbed 6-8 steps and afterwards found himself to be significantly dyspneic, which is very abnormal for him.  Since that time he has had more frequent, intermittent palpitations, measuring his pulse to be consistently above 100.  Denies headache or vision change, no recent fevers or cough, no chest pain, no shortness of breath at rest, no abdominal pain, no dysuria.  Wife passed away 3 years ago, recent anniversary of her death, does endorse having significant stress and anxiety related to this recently.  Review of Systems  A complete 10 system review of systems was obtained and all systems are negative except as noted in the HPI and PMH.   Patient's Health History    Past Medical History:  Diagnosis Date  . Arthritis    lower back bulging disc, hips, knees, thumbs, shoulder  . Bladder cancer ([place]) UROLOGIST-  DR MCKENZIE   S/P TURBT 08-16-2017  . BPH (benign prostatic hyperplasia)   . Elevated PSA   . Frequency of urination   . GERD (gastroesophageal reflux disease)   . Hematuria   . History of colon polyps   . History of dysphagia    has some difficulty swallowing pills  . History of esophageal dilatation    for dysplasia w/ pills  . HLD (hyperlipidemia)   . HOH (hard of hearing)    slightly hoh  . HTN (hypertension)   . Hypothyroidism   . Incontinence of urine    only wears protection if he's planning to be in public for an extended amt of time  . 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 Shoulder bursitis    undiagnosed. painful and limited ROM on left  . Weakness of extremity    left knee  . Wears glasses     Past Surgical History:  Procedure Laterality Date  . CYSTOSCOPY W/ URETERAL STENT PLACEMENT Bilateral 08/16/2017   Procedure: CYSTOSCOPY WITH RETROGRADE PYELOGRAM/URETERAL STENT PLACEMENT;  Surgeon: Cleon Gustin, MD;  [facility]: [facility];  Service: Urology;  Laterality: Bilateral;  . KNEE ARTHROSCOPY Left 06/10/2011  . PROSTATE BIOPSY  2000  . TRANSURETHRAL RESECTION OF BLADDER TUMOR N/A 08/16/2017   Procedure: TRANSURETHRAL RESECTION OF BLADDER TUMOR (TURBT);  Surgeon: Cleon Gustin, MD;  Location: [facility];  Service: Urology;  Laterality: N/A;  . TRANSURETHRAL RESECTION OF BLADDER TUMOR  12-09-2012   dr Alona Bene [facility]   and TURP  . TRANSURETHRAL RESECTION OF BLADDER TUMOR N/A 09/20/2017   Procedure: TRANSURETHRAL RESECTION OF BLADDER TUMOR (TURBT);  Surgeon: Cleon Gustin, MD;  Location: [facility];  Service: Urology;  Laterality: N/A;  . TRANSURETHRAL RESECTION OF PROSTATE  2011    Family History  Problem Relation Age of Onset  . Hypertension Mother   . Breast cancer Mother   . Other Mother        brain  tumor  . Alzheimer's disease Mother   . Emphysema Father        smoker    Social History   Socioeconomic History  . Marital status: Widowed    Spouse name: Not on file  . Number of children: 3  . Years of education: Not on file  . Highest education level: Not on file  Occupational History  . Occupation: retired  Scientific laboratory technician  . Financial resource strain: Not on file  . Food insecurity:    Worry: Not on file    Inability: Not on file  . Transportation needs:    Medical: Not on file    Non-medical: Not on file  Tobacco Use  . Smoking status: Never  Smoker  . Smokeless tobacco: Never Used  Substance and Sexual Activity  . Alcohol use: Yes    Alcohol/week: 0.0 standard drinks    Comment: occa beer or wine  . Drug use: No  . Sexual activity: Not on file  Lifestyle  . Physical activity:    Days per week: Not on file    Minutes per session: Not on file  . Stress: Not on file  Relationships  . Social connections:    Talks on phone: Not on file    Gets together: Not on file    Attends religious service: Not on file    Active member of club or organization: Not on file    Attends meetings of clubs or organizations: Not on file    Relationship status: Not on file  . Intimate partner violence:    Fear of current or ex partner: Not on file    Emotionally abused: Not on file    Physically abused: Not on file    Forced sexual activity: Not on file  Other Topics Concern  . Not on file  Social History Narrative  . Not on file     Physical Exam  Vital Signs and Nursing Notes reviewed Vitals:   08/14/18 1730 08/14/18 1800  BP: 100/81 125/77  Pulse: 85 83  Resp: 16 15  Temp:    SpO2: 94% 98%    CONSTITUTIONAL: Well-appearing, NAD NEURO:  Alert and oriented x 3, no focal deficits EYES:  eyes equal and reactive ENT/NECK:  no LAD, no JVD CARDIO: Tachycardic rate, well-perfused, normal S1 and S2 PULM:  CTAB no wheezing or rhonchi GI/GU:  normal bowel sounds, non-distended, non-tender MSK/SPINE:  No gross deformities, no edema SKIN:  no rash, atraumatic PSYCH:  Appropriate speech and behavior  Diagnostic and Interventional Summary    EKG Interpretation  Date/Time:  Sunday August 14 2018 15:19:06 EDT Ventricular Rate:  98 PR Interval:    QRS Duration: 102 QT Interval:  346 QTC Calculation: 442 R Axis:   18 Text Interpretation:  Sinus rhythm Abnormal R-wave progression, early transition Confirmed by Gerlene Fee [phone]) on 08/14/2018 3:50:03 PM      Labs Reviewed  BASIC METABOLIC PANEL - Abnormal; Notable for the  following components:      Result Value   CO2 20 (*)    Glucose, Bld 193 (*)    BUN 32 (*)    Creatinine, Ser 1.94 (*)    GFR calc non Af Amer 30 (*)    GFR calc Af Amer 35 (*)    All other components within normal limits  CBC  D-DIMER, QUANTITATIVE (NOT AT [facility])  TROPONIN I    DG Chest 2 View  Final Result      Medications  sodium chloride 0.9 % bolus 1,000 mL (0 mLs Intravenous Stopped 08/14/18 1808)     Procedures Critical Care  ED Course and Medical Decision Making  I have reviewed the triage vital signs and the nursing notes.  Pertinent labs & imaging results that were available during my care of the patient were reviewed by me and considered in my medical decision making (see below for details).  Considering anxiety versus pulmonary embolism versus symptomatic anemia in this 84 year old male, history of bladder cancer.  Arrives tachycardic, no evidence of DVT on exam, would consider low risk for PE.  Labs, d-dimer, will reassess.  Troponin negative, d-dimer negative, possibly some mild AKI versus progression of CKD.  No evidence of arrhythmia during his time here in the ED on telemetry.  Patient feels well, provide a liter of fluids here in the ED.  Tachycardia resolved.  Will follow closely with his PCP.  After the discussed management above, the patient was determined to be safe for discharge.  The patient was in agreement with this plan and all questions regarding their care were answered.  ED return precautions were discussed and the patient will return to the ED with any significant worsening of condition.  Barth Kirks. Sedonia Small, [place] [email or web address]  Final Clinical Impressions(s) / ED Diagnoses     ICD-10-CM   1. Palpitations R00.2   2. DOE (dyspnea on exertion) R06.09 DG Chest 2 View    DG Chest 2 View    ED Discharge Orders    None         Maudie Flakes, MD 08/14/18 1843

## 2018-08-14 NOTE — ED Notes (Signed)
Patient transported to X-ray 

## 2018-08-14 NOTE — ED Triage Notes (Signed)
Pt BIB ems From home with sudden onset palpitations with sob. Denies pain. Pt took 324mg  asa prior to arrival. 12 lead unremarkable. Denies dizziness. Pt recently started on Flomax. HR 104, BP 154/74, RR 22, 98% RA, CBG 189,

## 2018-09-12 DIAGNOSIS — Z23 Encounter for immunization: Secondary | ICD-10-CM | POA: Diagnosis not present

## 2018-09-19 DIAGNOSIS — R42 Dizziness and giddiness: Secondary | ICD-10-CM | POA: Diagnosis not present

## 2018-10-24 DIAGNOSIS — R7309 Other abnormal glucose: Secondary | ICD-10-CM | POA: Diagnosis not present

## 2018-10-24 DIAGNOSIS — N183 Chronic kidney disease, stage 3 (moderate): Secondary | ICD-10-CM | POA: Diagnosis not present

## 2018-10-24 DIAGNOSIS — E039 Hypothyroidism, unspecified: Secondary | ICD-10-CM | POA: Diagnosis not present

## 2018-10-24 IMAGING — DX DG CHEST 2V
2 series · 2 of 2 positions shown · non-contrast
Comparison: None.

CLINICAL DATA: Shortness of breath

EXAM:
CHEST - 2 VIEW

[chest pa]
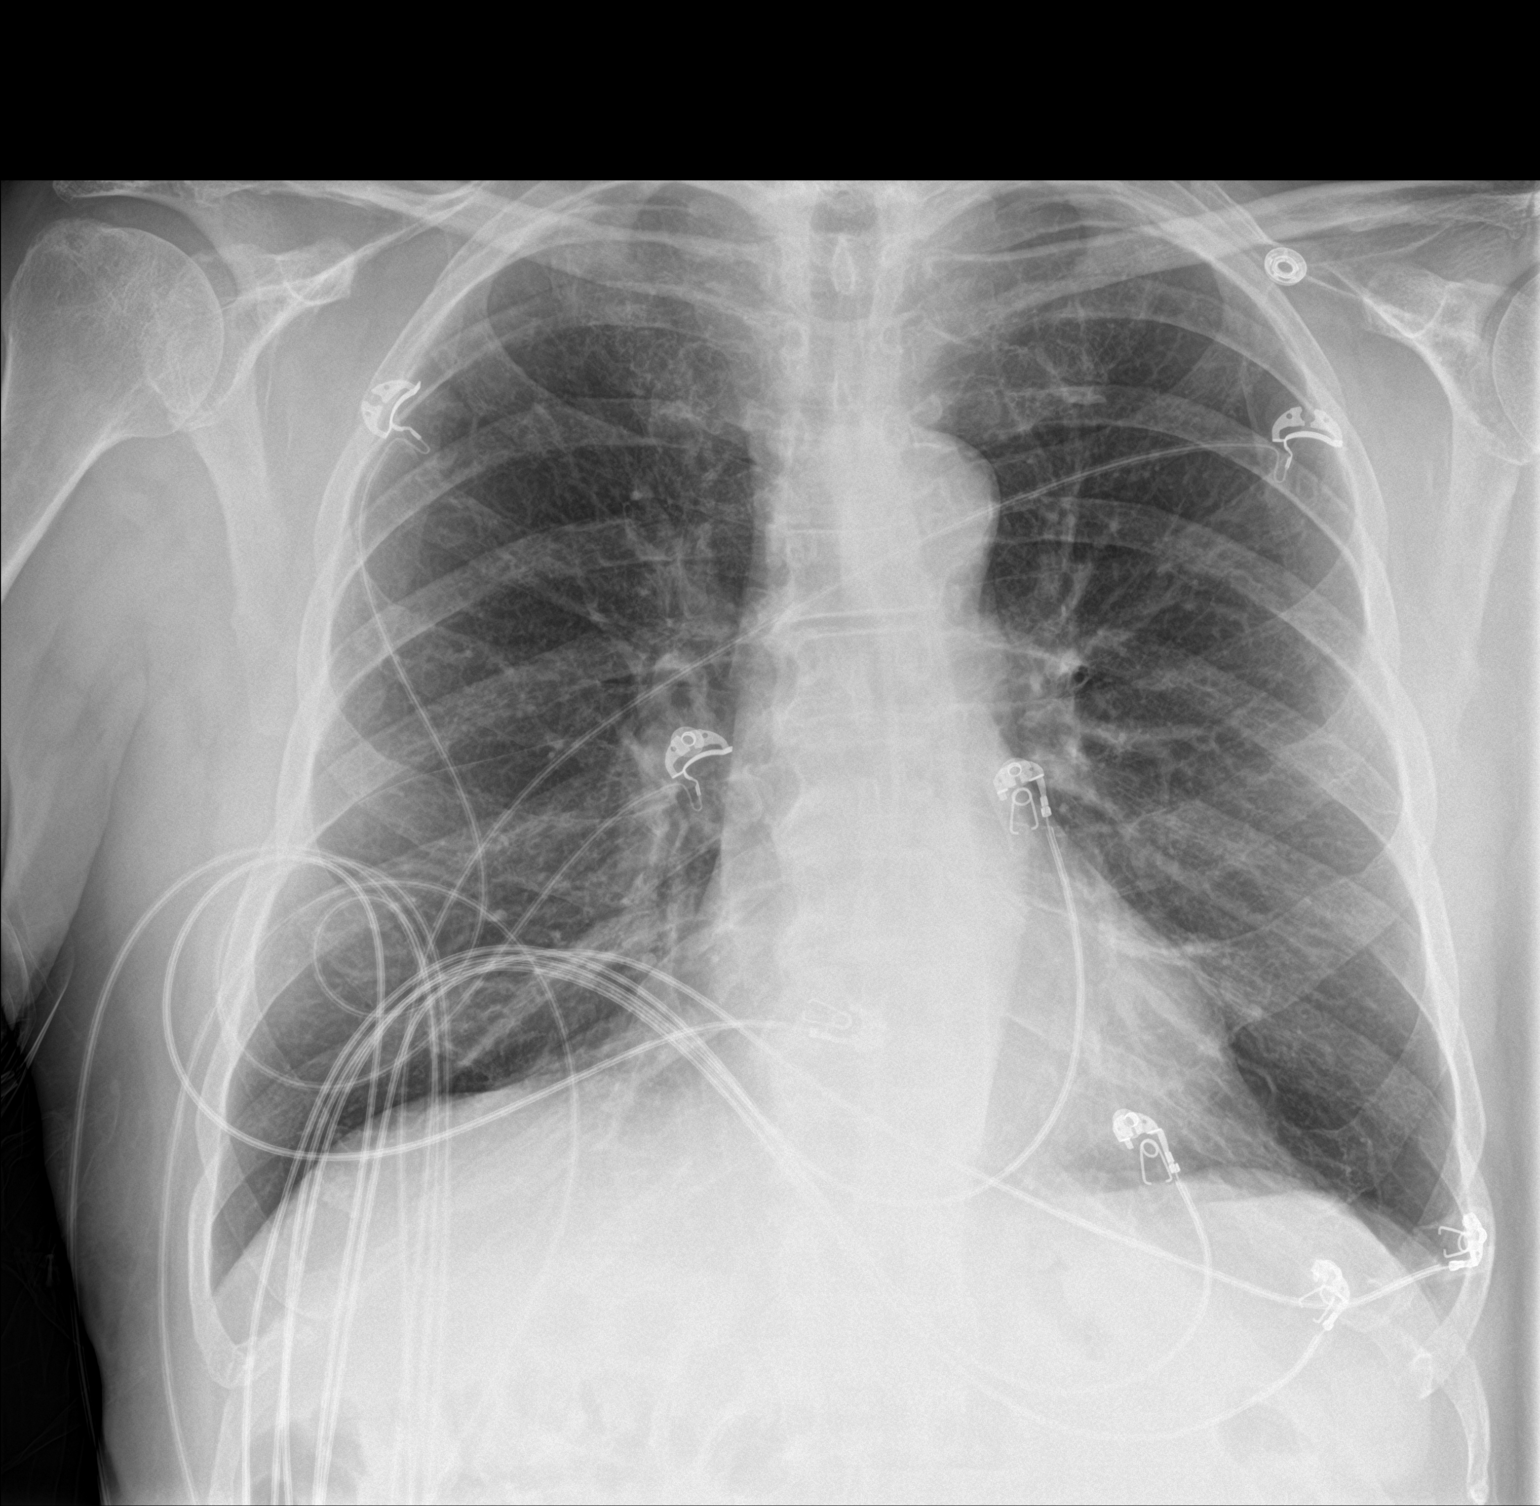

[chest lat]
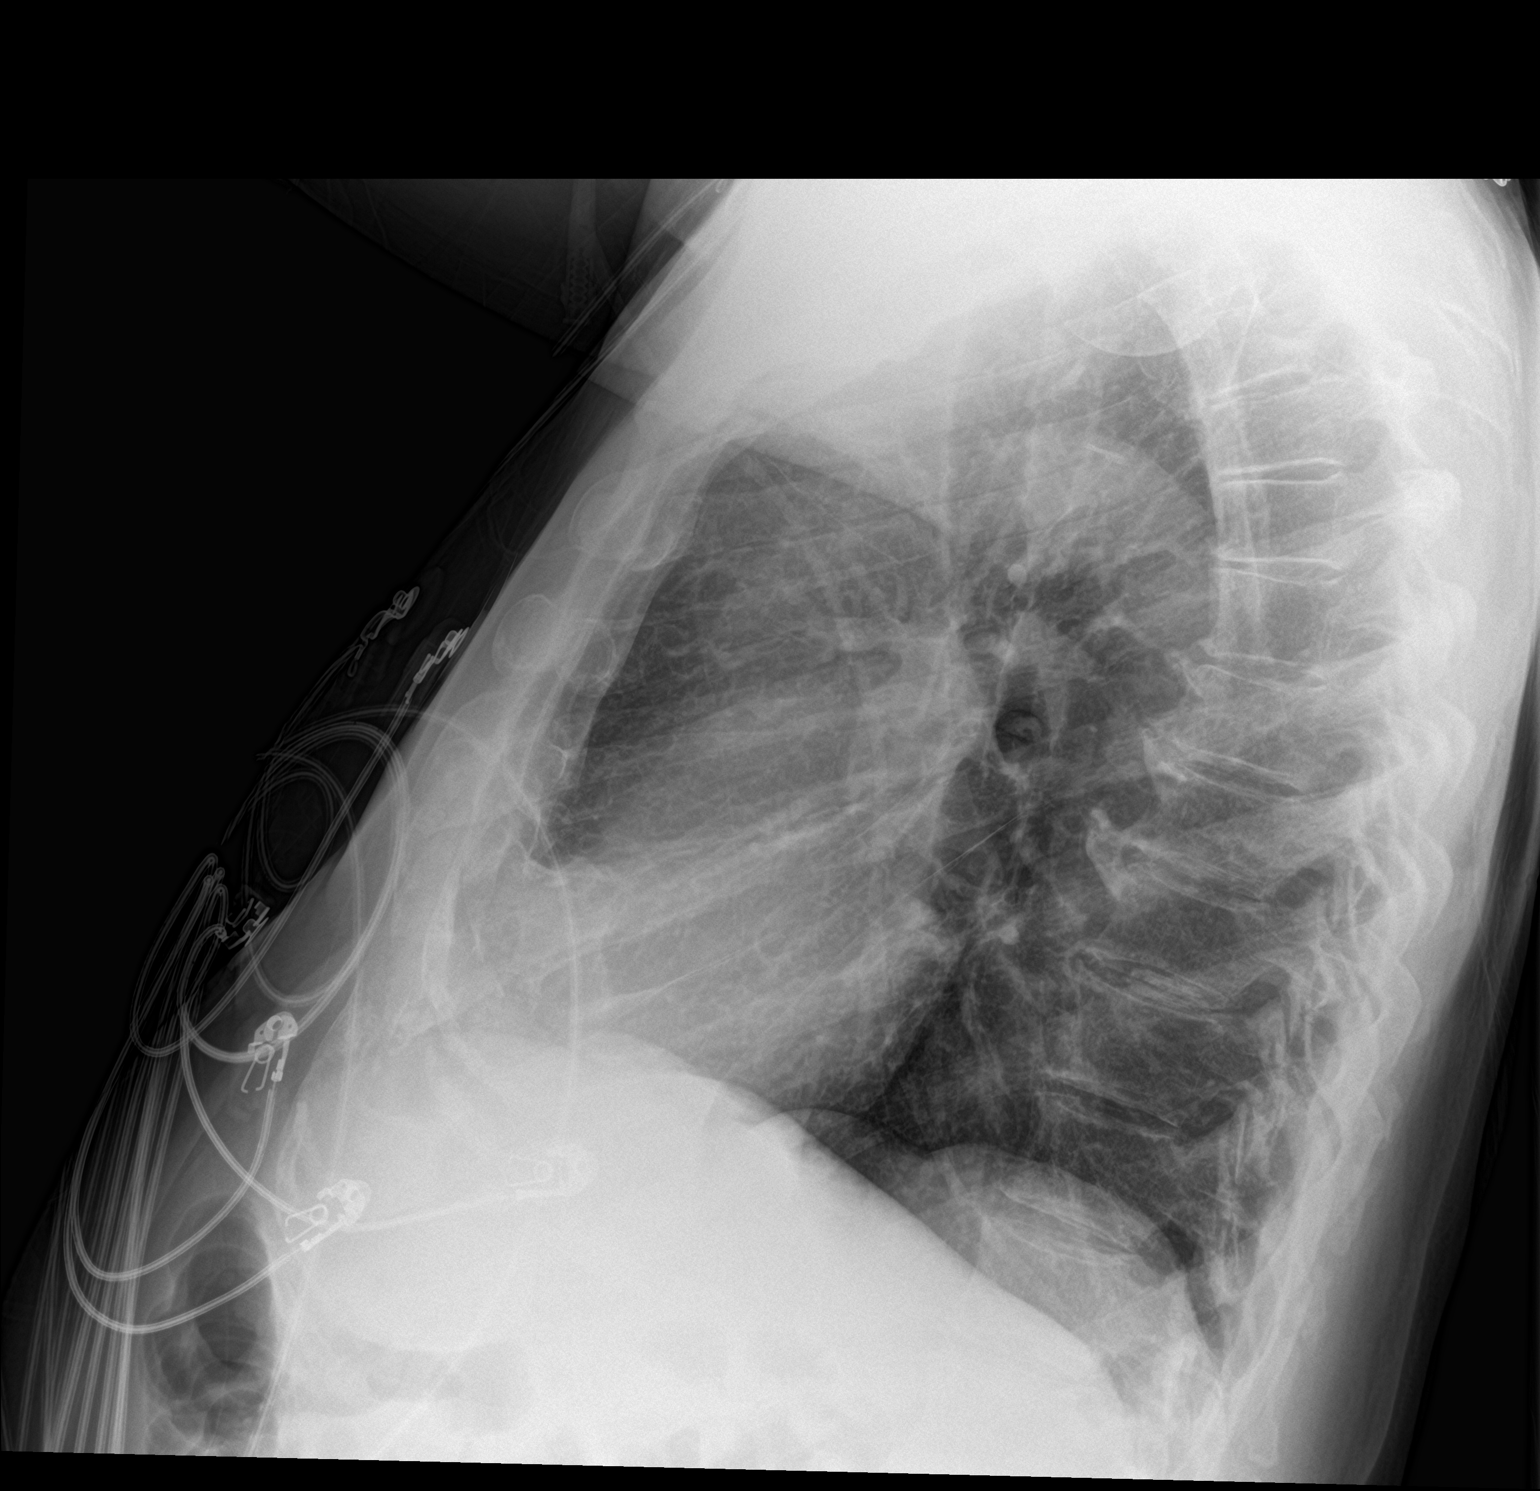

[2 of 2 positions shown; findings below may reference images not displayed]

FINDINGS: The lungs are clear without focal pneumonia, edema, pneumothorax or
pleural effusion. The cardiopericardial silhouette is within normal
limits for size. The visualized bony structures of the thorax are
intact. Telemetry leads overlie the chest.
IMPRESSION: No active cardiopulmonary disease.

## 2018-10-31 DIAGNOSIS — H6123 Impacted cerumen, bilateral: Secondary | ICD-10-CM | POA: Diagnosis not present

## 2018-10-31 DIAGNOSIS — E7800 Pure hypercholesterolemia, unspecified: Secondary | ICD-10-CM | POA: Diagnosis not present

## 2018-10-31 DIAGNOSIS — N183 Chronic kidney disease, stage 3 (moderate): Secondary | ICD-10-CM | POA: Diagnosis not present

## 2018-10-31 DIAGNOSIS — I129 Hypertensive chronic kidney disease with stage 1 through stage 4 chronic kidney disease, or unspecified chronic kidney disease: Secondary | ICD-10-CM | POA: Diagnosis not present

## 2018-11-01 DIAGNOSIS — C678 Malignant neoplasm of overlapping sites of bladder: Secondary | ICD-10-CM | POA: Diagnosis not present

## 2019-02-02 DIAGNOSIS — C678 Malignant neoplasm of overlapping sites of bladder: Secondary | ICD-10-CM | POA: Diagnosis not present

## 2019-05-02 DIAGNOSIS — Z7189 Other specified counseling: Secondary | ICD-10-CM | POA: Diagnosis not present

## 2019-05-02 DIAGNOSIS — I129 Hypertensive chronic kidney disease with stage 1 through stage 4 chronic kidney disease, or unspecified chronic kidney disease: Secondary | ICD-10-CM | POA: Diagnosis not present

## 2019-05-02 DIAGNOSIS — Z0000 Encounter for general adult medical examination without abnormal findings: Secondary | ICD-10-CM | POA: Diagnosis not present

## 2019-05-02 DIAGNOSIS — E7800 Pure hypercholesterolemia, unspecified: Secondary | ICD-10-CM | POA: Diagnosis not present

## 2019-05-02 DIAGNOSIS — N183 Chronic kidney disease, stage 3 (moderate): Secondary | ICD-10-CM | POA: Diagnosis not present

## 2019-05-03 DIAGNOSIS — H0100A Unspecified blepharitis right eye, upper and lower eyelids: Secondary | ICD-10-CM | POA: Diagnosis not present

## 2019-05-03 DIAGNOSIS — H04123 Dry eye syndrome of bilateral lacrimal glands: Secondary | ICD-10-CM | POA: Diagnosis not present

## 2019-05-03 DIAGNOSIS — Z961 Presence of intraocular lens: Secondary | ICD-10-CM | POA: Diagnosis not present

## 2019-05-03 DIAGNOSIS — H52203 Unspecified astigmatism, bilateral: Secondary | ICD-10-CM | POA: Diagnosis not present

## 2019-05-05 DIAGNOSIS — R3912 Poor urinary stream: Secondary | ICD-10-CM | POA: Diagnosis not present

## 2019-05-05 DIAGNOSIS — C678 Malignant neoplasm of overlapping sites of bladder: Secondary | ICD-10-CM | POA: Diagnosis not present

## 2019-05-12 DIAGNOSIS — N183 Chronic kidney disease, stage 3 (moderate): Secondary | ICD-10-CM | POA: Diagnosis not present

## 2019-05-12 DIAGNOSIS — R7309 Other abnormal glucose: Secondary | ICD-10-CM | POA: Diagnosis not present

## 2019-05-12 DIAGNOSIS — I10 Essential (primary) hypertension: Secondary | ICD-10-CM | POA: Diagnosis not present

## 2019-05-12 DIAGNOSIS — Z0000 Encounter for general adult medical examination without abnormal findings: Secondary | ICD-10-CM | POA: Diagnosis not present

## 2019-05-12 DIAGNOSIS — Z23 Encounter for immunization: Secondary | ICD-10-CM | POA: Diagnosis not present

## 2019-05-16 ENCOUNTER — Other Ambulatory Visit: Payer: Self-pay | Admitting: Physician Assistant

## 2019-05-16 DIAGNOSIS — T63481A Toxic effect of venom of other arthropod, accidental (unintentional), initial encounter: Secondary | ICD-10-CM | POA: Diagnosis not present

## 2019-05-16 DIAGNOSIS — L570 Actinic keratosis: Secondary | ICD-10-CM | POA: Diagnosis not present

## 2019-05-16 DIAGNOSIS — R202 Paresthesia of skin: Secondary | ICD-10-CM | POA: Diagnosis not present

## 2019-05-16 DIAGNOSIS — D485 Neoplasm of uncertain behavior of skin: Secondary | ICD-10-CM | POA: Diagnosis not present

## 2019-05-16 DIAGNOSIS — L821 Other seborrheic keratosis: Secondary | ICD-10-CM | POA: Diagnosis not present

## 2019-08-03 DIAGNOSIS — R5383 Other fatigue: Secondary | ICD-10-CM | POA: Diagnosis not present

## 2019-08-03 DIAGNOSIS — Z7189 Other specified counseling: Secondary | ICD-10-CM | POA: Diagnosis not present

## 2019-08-03 DIAGNOSIS — Z23 Encounter for immunization: Secondary | ICD-10-CM | POA: Diagnosis not present

## 2019-08-03 DIAGNOSIS — G479 Sleep disorder, unspecified: Secondary | ICD-10-CM | POA: Diagnosis not present

## 2019-08-03 DIAGNOSIS — R600 Localized edema: Secondary | ICD-10-CM | POA: Diagnosis not present

## 2019-08-03 DIAGNOSIS — R0602 Shortness of breath: Secondary | ICD-10-CM | POA: Diagnosis not present

## 2019-08-10 DIAGNOSIS — R600 Localized edema: Secondary | ICD-10-CM | POA: Diagnosis not present

## 2019-08-10 DIAGNOSIS — M7989 Other specified soft tissue disorders: Secondary | ICD-10-CM | POA: Diagnosis not present

## 2019-08-10 DIAGNOSIS — R0600 Dyspnea, unspecified: Secondary | ICD-10-CM | POA: Diagnosis not present

## 2019-08-11 DIAGNOSIS — C678 Malignant neoplasm of overlapping sites of bladder: Secondary | ICD-10-CM | POA: Diagnosis not present

## 2019-08-21 ENCOUNTER — Ambulatory Visit (INDEPENDENT_AMBULATORY_CARE_PROVIDER_SITE_OTHER): Payer: Medicare Other | Admitting: Neurology

## 2019-08-21 ENCOUNTER — Encounter: Payer: Self-pay | Admitting: Neurology

## 2019-08-21 ENCOUNTER — Other Ambulatory Visit: Payer: Self-pay

## 2019-08-21 VITALS — BP 152/83 | HR 80 | Temp 98.2°F | Ht 69.0 in | Wt 200.0 lb

## 2019-08-21 DIAGNOSIS — R5383 Other fatigue: Secondary | ICD-10-CM | POA: Diagnosis not present

## 2019-08-21 DIAGNOSIS — C679 Malignant neoplasm of bladder, unspecified: Secondary | ICD-10-CM | POA: Diagnosis not present

## 2019-08-21 DIAGNOSIS — G4719 Other hypersomnia: Secondary | ICD-10-CM | POA: Diagnosis not present

## 2019-08-21 DIAGNOSIS — N183 Chronic kidney disease, stage 3 unspecified: Secondary | ICD-10-CM

## 2019-08-21 NOTE — Progress Notes (Signed)
SLEEP MEDICINE CLINIC    Provider:  Larey Seat, MD  Primary Care Physician:  [name], [place] [address]     Referring Provider: [name], [place],  [place] [address]          Chief Complaint according to patient   Patient presents with:    . New Patient (Initial Visit). This 85 year old patient states that when he is sedentary he notices a fatigue that will come over him , so that he has to lay down. He states that this is new for him. never had a PSG. pt unsure if snores in sleep.           HISTORY OF PRESENT ILLNESS:  [name] [name] is a 85 y.o. year old  Caucasian male patient seen on 08/21/2019 from Dr Ashby Dawes, MD.   Chief concern according to patient : " having spells that make me feel completely drained of energy".   I have the pleasure of seeing [name] [name] today, a right -handed Caucasian male with a possible sleep disorder. He  Has not been waling as much, and this only over the last 3 moth, he can't do the same amount of yard work, just for the last 3 month, he documented this on his smart app.   His PCP had initiated a work up with cardiology and US of the lower extremities. No abnormal findings.  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   Shoulder bursitis, Weakness of extremity, and Wears glasses.   Sleep relevant medical history: Nocturia only once, No parasomnia and noTonsillectomy, no cervical spine or head trauma.    Family medical /sleep history: no other family member with OSA, insomnia, no sleep walkers.    Social history:  Patient is a former [employer] from [place], [place], now  retired from Estate manager/land agent at age 55, and lives in a household alone. Family status is widowed , his wife of 69 years died 68 years ago, 3 adult children 2 in their 20 s, 5 grandchildren.  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  Pets are not present. Tobacco use*- never.  ETOH use; wine or beer , not more than 1 a day.   Caffeine intake in form of Coffee( 2-3 cups in AM) Soda( none ) Tea ( none) or energy drinks. Regular exercise in form of walking, which changed 3 month ago. .   Hobbies : gardening.   Sleep habits are as follows: The patient's dinner time is between 5 PM. The patient goes to bed at midnight or later and continues to sleep for 6-7 hours, wakes for one bathroom break, goes back to sleep. Bedroom is cool, quiet and dark. He kept a journal at bedtime.  The preferred sleep position is left side , with the support of 1 pillow. Dreams are reportedly rare  And sometimes vivid.   6 .05 AM is the usual rise time. The patient wakes up with an alarm.  He reports feeling refreshed or restored in AM, but has occasional symptoms  such as dry mout, no morning headaches.  Naps are taken ifrequently, lasting from 30 to 60 minutes and are as refreshing than nocturnal sleep.    Review of Systems: Out of a complete 14 system review, the patient complains of only the following symptoms, and all other reviewed systems are negative.:  Fatigue, sleepiness, snoring, Nocturia- fragmented sleep,    How likely are you to doze in the following situations: 0 = not likely, 1 = slight chance, 2 = moderate chance, 3 = high chance   Sitting and Reading?2 Watching Television? 2 Sitting inactive in a public place (theater or meeting)?1 As a passenger in a car for an hour without  a break?3 Lying down in the afternoon when circumstances permit?2 Sitting and talking to someone?0 Sitting quietly after lunch without alcohol?2 In a car, while stopped for a few minutes in traffic?0   Total =  12/ 24 points   FSS endorsed at 26/ 63 points.   Social History   Socioeconomic History  . Marital status: Widowed    Spouse name: Not on file  . Number of children: 3  . Years of education: Not on file  . Highest education level: Not on file  Occupational History  . Occupation: retired  Scientific laboratory technician  . Financial resource strain: Not on file  . Food insecurity    Worry: Not on file    Inability: Not on file  . Transportation needs    Medical: Not on file    Non-medical: Not on file  Tobacco Use  . Smoking status: Never Smoker  . Smokeless tobacco: Never Used  Substance and Sexual Activity  . Alcohol use: Yes    Alcohol/week: 1.0 - 2.0 standard drinks    Types: 1 - 2 Glasses of wine per week    Comment: occa beer or wine  . Drug use: No  . Sexual activity: Not on file  Lifestyle  . Physical activity    Days per week: Not on file    Minutes per session: Not on file  . Stress: Not on file  Relationships  . Social Herbalist on phone: Not on file    Gets together: Not on file    Attends religious service: Not on file    Active member of club or organization: Not on file    Attends meetings of clubs or organizations: Not on file    Relationship status: Not on file  Other Topics Concern  . Not on file  Social History Narrative  . Not on file    Family History  Problem Relation Age of Onset  . Hypertension Mother   . Breast cancer Mother   . Other Mother        brain tumor  . Alzheimer's disease Mother   . Emphysema Father        smoker    Past Medical History:  Diagnosis Date  . Arthritis    lower back bulging disc, hips, knees, thumbs, shoulder  . Bladder cancer ([facility]) UROLOGIST-  DR MCKENZIE   S/P TURBT 08-16-2017  . BPH (benign  prostatic hyperplasia)   . Elevated PSA   . Frequency of urination   . GERD (gastroesophageal reflux disease)   . Hematuria   . History of colon polyps   . History of dysphagia    has some difficulty swallowing pills  . History of esophageal dilatation    for dysplasia w/ pills  . HLD (hyperlipidemia)   .  HOH (hard of hearing)    slightly hoh  . HTN (hypertension)   . Hypothyroidism   . Incontinence of urine    only wears protection if he's planning to be in public for an extended amt of time  . 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 Shoulder bursitis    undiagnosed. painful and limited ROM on left  . Weakness of extremity    left knee  . Wears glasses     Past Surgical History:  Procedure Laterality Date  . CYSTOSCOPY W/ URETERAL STENT PLACEMENT Bilateral 08/16/2017   Procedure: CYSTOSCOPY WITH RETROGRADE PYELOGRAM/URETERAL STENT PLACEMENT;  Surgeon: Cleon Gustin, MD;  Location: [facility];  Service: Urology;  Laterality: Bilateral;  . KNEE ARTHROSCOPY Left 06/10/2011  . PROSTATE BIOPSY  2000  . TRANSURETHRAL RESECTION OF BLADDER TUMOR N/A 08/16/2017   Procedure: TRANSURETHRAL RESECTION OF BLADDER TUMOR (TURBT);  Surgeon: Cleon Gustin, MD;  Location: [facility];  Service: Urology;  Laterality: N/A;  . TRANSURETHRAL RESECTION OF BLADDER TUMOR  12-09-2012   dr Alona Bene [facility]   and TURP  . TRANSURETHRAL RESECTION OF BLADDER TUMOR N/A 09/20/2017   Procedure: TRANSURETHRAL RESECTION OF BLADDER TUMOR (TURBT);  Surgeon: Cleon Gustin, MD;  Location: [facility];  Service: Urology;  Laterality: N/A;  . TRANSURETHRAL RESECTION OF PROSTATE  2011     Current Outpatient Medications on File Prior to Visit  Medication Sig Dispense Refill  . aspirin 81 MG tablet Take 81 mg by mouth  every evening.     . finasteride (PROSCAR) 5 MG tablet Take 1 tablet by mouth every evening.     . irbesartan (AVAPRO) 300 MG tablet Take 1 tablet by mouth every morning.     Marland Kitchen levothyroxine (SYNTHROID, LEVOTHROID) 50 MCG tablet Take 1 tablet by mouth daily before breakfast.     . pantoprazole (PROTONIX) 40 MG tablet Take 1 tablet by mouth at bedtime.     . rosuvastatin (CRESTOR) 5 MG tablet Take 5 mg by mouth at bedtime.    . silodosin (RAPAFLO) 8 MG CAPS capsule Take 8 mg by mouth daily.     No current facility-administered medications on file prior to visit.     Allergies  Allergen Reactions  . Ciprofloxacin     REACTION: diarrhea.  Occurred with 750mg  dose.  Has since taken 500mg  doses and has had no reaction/diarrhea.  . Cephalexin Rash    Rash on arms    Physical exam:  Today's Vitals   08/21/19 1053  BP: (!) 152/83  Pulse: 80  Temp: 98.2 F (36.8 C)  Weight: 200 lb (90.7 kg)  Height: 5\' 9"  (1.753 m)   Body mass index is 29.53 kg/m.   Wt Readings from Last 3 Encounters:  08/21/19 200 lb (90.7 kg)  08/14/18 192 lb (87.1 kg)  09/20/17 187 lb (84.8 kg)     Ht Readings from Last 3 Encounters:  08/21/19 5\' 9"  (1.753 m)  08/14/18 5\' 9"  (1.753 m)  09/20/17 5\' 9"  (1.753 m)      General: The patient is awake, alert and appears not in acute distress. The patient is well groomed. Head: Normocephalic, atraumatic. Neck is supple. Mallampati; 3,  neck circumference:16  inches . Nasal airflow patent.  Retrognathia is not seen.  Dental status:  Some crowns.  Cardiovascular:  Regular  rate and cardiac rhythm by pulse,  without distended neck veins. Respiratory: Lungs are clear to auscultation.  Skin:  Without evidence of ankle edema, or rash. Trunk: The patient's posture is erect.   Neurologic exam : The patient is awake and alert, oriented to place and time.   Memory subjective described as intact.  Attention span & concentration ability appears normal.  Speech is  fluent,  without  dysarthria, dysphonia or aphasia.  Mood and affect are appropriate.   Cranial nerves: no loss of smell or taste reported  Pupils are equal and briskly reactive to light. Funduscopic exam deferred.   Extraocular movements in vertical and horizontal planes were intact and without nystagmus.  Diplopia- he sees 2 moons on long distance view when fatigued. Horizontal.  Visual fields by finger perimetry are intact. Hearing was impaired  to soft voice and finger rubbing.    Facial sensation intact to fine touch.  Facial motor strength is symmetric and tongue and uvula move midline.  Neck ROM : rotation, tilt and flexion extension were normal for age and shoulder shrug was symmetrical.    Motor exam:  Symmetric bulk, tone and ROM.   Normal tone without cog wheeling, symmetric grip strength . Left knee buckles.    Sensory:  Fine touch, pinprick and vibration were tested  and  normal.  Proprioception tested in the upper extremities was normal.   Coordination: Rapid alternating movements in the fingers/hands were of normal speed. changes in penmanship reported, he writes more sloppy, often  prints.  The Finger-to-nose maneuver was intact without evidence of ataxia, dysmetria or tremor.   Gait and station: Patient could rise unassisted from a seated position, walked without assistive device.  Stance is of normal width/ base and the patient turned with 4 steps. He reports difficulties on uneven terrain and on a stair case.  Toe and heel walk were deferred.  Deep tendon reflexes: in the  upper and lower extremities are symmetric and intact.  Babinski response was deferred.  After spending a total time of  35  minutes face to face and additional time for physical and neurologic examination, review of laboratory studies,  personal review of imaging studies, reports and results of other testing and review of referral information / records as far as provided in visit, I have established  the following assessments:  1) [name] [name].  Is a 85 year old Caucasian gentleman who looks younger than his married age.  He has always kept a high level of activity but over the last 3 months has noted that he could not keep up with his usual pace.  His activity level as reported by his smart phone has helped over the last 3 months in comparison to March April or May of the same year.  He does not have any kind of focal neurologic deficits and his orthopedic problems are present for many years prior to this beginning exercise intolerance.  He is sleeping and easily fatigued as well.  Since he sleeps alone there is nobody witnessing his sleep but he feels that for the most part he gets enough sleep at night yet he is excessively daytime sleepy by his own assessment.   My goal is to invite [name] [name] for an attended sleep study to see if sleep apnea is present, there may be irregular heartbeats,.'s of low oxygen saturation but also can lead to fatigue without apnea being present.  He presented today with an elevated blood pressure but this is not typical  for him.  He has occasionally ankle edema mostly in the left leg, but not present today.  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  An echocardiogram was quoted from 08/03/2019 but have not seen the results.  The patient was told that there was no concern.  I would like to add that the patient does not endorse any symptoms of depression on the geriatric depression score which he endorsed at only 4 out of 15 points.   My Plan is to proceed with:  1) attnended sleep study, we still do not perform split nights, will be a baseline PSG,     I would  like to thank [name], MD and [address] [place],  [place] [address] for allowing me to meet with and to take care of this pleasant patient.   I plan to follow up either personally or through our NP within 2-3  month.    Electronically signed by: Larey Seat, MD 08/21/2019 11:10 AM  [facility] Board certified by The [employer] of Sleep Medicine and Diplomate of the [employer] of Sleep Medicine. Board certified In Neurology through the [place], Fellow of the [employer] of Neurology. Medical Director of [employer].

## 2019-08-21 NOTE — Patient Instructions (Signed)
Sleep Study  [place] will be scheduled for a sleep study , The sleep study consists of a recording of your brain waves (EEG). Breathing, heart rate and rhythm (ECG), oxygen level, eye movement, and leg movement.  The technician will glue or or paste several electrodes to your scalp, face, chest and legs.  You will have belts around your chest and abdomen to record breathing and a finger clasp to check blood oxygen levels.  A tube at your mouth and nose will detect airflow.  There are no needle sticks or painful procedures of any sort.  You will have your own room, and we will make every effort to attend to your comfort and privacy.  Please prepare for your study by the following steps:   Please avoid coffee, tea, soda, chocolate and other caffeine foods or beverages after 12:00 noon on the day of your sleep study.   You must arrive with clean (no oils), conditioners or make up, and please make sure that you wash your hair to ensure that your hair and scalp are clean, dry and free of any hair extensions on the day of your study.  This will help to get a good reading of study.  Please try not to nap on the day of your study.  Please bring a list of all your medications.  Bring any medications that you might need during the time you are within the laboratory, including insulin, sleeping pills, pain medication and anxiety medications.  Bring snacks, water or juice  Please bring clothes to sleep in and your normal overnight bag.  Please leave valuable at home, as we will not be responsible for any lost items.  If you have any further questions, please feel free to call our office. Thank you  Please call our office 48 in advance to cancel or reschedule to avoid a  cancel or no show fee.  Hypersomnia Hypersomnia is a condition in which a person feels very tired during the day even though he or she gets plenty of sleep at night. A person with this condition may take naps during the day  and may find it very difficult to wake up from sleep. Hypersomnia may affect a person's ability to think, concentrate, drive, or remember things. What are the causes? The cause of this condition may not be known. Possible causes include:  Certain medicines.  Sleep disorders, such as narcolepsy and sleep apnea.  Injury to the head, brain, or spinal cord.  Drug or alcohol use.  Gastroesophageal reflux disease (GERD).  Tumors.  Certain medical conditions, such as depression, diabetes, or an underactive thyroid gland (hypothyroidism). What are the signs or symptoms? The main symptoms of hypersomnia include:  Feeling very tired throughout the day, regardless of how much sleep you got the night before.  Having trouble waking up. Others may find it difficult to wake you up when you are sleeping.  Sleeping for longer and longer periods at a time.  Taking naps throughout the day. Other symptoms may include:  Feeling restless, anxious, or annoyed.  Lacking energy.  Having trouble with: ? Remembering. ? Speaking. ? Thinking.  Loss of appetite.  Seeing, hearing, tasting, smelling, or feeling things that are not real (hallucinations). How is this diagnosed? This condition may be diagnosed based on:  Your symptoms and medical history.  Your sleeping habits. Your health care provider may ask you to write down your sleeping habits in a daily sleep log, along with any symptoms you  have.  A series of tests that are done while you sleep (sleep study or polysomnogram).  A test that measures how quickly you can fall asleep during the day (daytime nap study or multiple sleep latency test). How is this treated? Treatment can help you manage your condition. Treatment may include:  Following a regular sleep routine.  Lifestyle changes, such as changing your eating habits, getting regular exercise, and avoiding alcohol or caffeinated beverages.  Taking medicines to make you more alert  (stimulants) during the day.  Treating any underlying medical causes of hypersomnia. Follow these instructions at home: Sleep routine   Schedule the same bedtime and wake-up time each day.  Practice a relaxing bedtime routine. This may include reading, meditation, deep breathing, or taking a warm bath before going to sleep.  Get regular exercise each day. Avoid strenuous exercise in the evening hours.  Keep your sleep environment at a cooler temperature, darkened, and quiet.  Sleep with pillows and a mattress that are comfortable and supportive.  Schedule short 20-minute naps for when you feel sleepiest during the day.  Talk with your employer or teachers about your hypersomnia. If possible, adjust your schedule so that: ? You have a regular daytime work schedule. ? You can take a scheduled nap during the day. ? You do not have to work or be active at night.  Do not eat a heavy meal for a few hours before bedtime. Eat your meals at about the same times every day.  Avoid drinking alcohol or caffeinated beverages. Safety   Do not drive or use heavy machinery if you are sleepy. Ask your health care provider if it is safe for you to drive.  Wear a life jacket when swimming or spending time near water. General instructions  Take supplements and over-the-counter and prescription medicines only as told by your health care provider.  Keep a sleep log that will help your doctor manage your condition. This may include information about: ? What time you go to bed each night. ? How often you wake up at night. ? How many hours you sleep at night. ? How often and for how long you nap during the day. ? Any observations from others, such as leg movements during sleep, sleep walking, or snoring.  Keep all follow-up visits as told by your health care provider. This is important. Contact a health care provider if:  You have new symptoms.  Your symptoms get worse. Get help right away  if:  You have serious thoughts about hurting yourself or someone else. If you ever feel like you may hurt yourself or others, or have thoughts about taking your own life, get help right away. You can go to your nearest emergency department or call:  Your local emergency services (911 in the [place]  A suicide crisis helpline, such as the [facility] at 367-673-6231. This is open 24 hours a day. Summary  Hypersomnia refers to a condition in which you feel very tired during the day even though you get plenty of sleep at night.  A person with this condition may take naps during the day and may find it very difficult to wake up from sleep.  Hypersomnia may affect a person's ability to think, concentrate, drive, or remember things.  Treatment, such as following a regular sleep routine and making some lifestyle changes, can help you manage your condition. This information is not intended to replace advice given to you by your health care provider.  Make sure you discuss any questions you have with your health care provider. Document Released: 10/30/2002 Document Revised: 11/11/2017 Document Reviewed: 11/11/2017 Elsevier Patient Education  2020 [employer].

## 2019-08-24 DIAGNOSIS — R42 Dizziness and giddiness: Secondary | ICD-10-CM | POA: Diagnosis not present

## 2019-08-24 DIAGNOSIS — R002 Palpitations: Secondary | ICD-10-CM | POA: Diagnosis not present

## 2019-08-24 DIAGNOSIS — R600 Localized edema: Secondary | ICD-10-CM | POA: Diagnosis not present

## 2019-08-24 DIAGNOSIS — R0602 Shortness of breath: Secondary | ICD-10-CM | POA: Diagnosis not present

## 2019-08-24 DIAGNOSIS — R5383 Other fatigue: Secondary | ICD-10-CM | POA: Diagnosis not present

## 2019-09-10 ENCOUNTER — Ambulatory Visit (INDEPENDENT_AMBULATORY_CARE_PROVIDER_SITE_OTHER): Payer: Medicare Other | Admitting: Neurology

## 2019-09-10 DIAGNOSIS — G4719 Other hypersomnia: Secondary | ICD-10-CM

## 2019-09-10 DIAGNOSIS — G4710 Hypersomnia, unspecified: Secondary | ICD-10-CM

## 2019-09-10 DIAGNOSIS — N183 Chronic kidney disease, stage 3 unspecified: Secondary | ICD-10-CM

## 2019-09-10 DIAGNOSIS — G4730 Sleep apnea, unspecified: Secondary | ICD-10-CM

## 2019-09-10 DIAGNOSIS — C679 Malignant neoplasm of bladder, unspecified: Secondary | ICD-10-CM

## 2019-09-10 DIAGNOSIS — R5383 Other fatigue: Secondary | ICD-10-CM

## 2019-09-10 DIAGNOSIS — I4940 Unspecified premature depolarization: Secondary | ICD-10-CM

## 2019-09-15 DIAGNOSIS — N183 Chronic kidney disease, stage 3 unspecified: Secondary | ICD-10-CM | POA: Insufficient documentation

## 2019-09-15 DIAGNOSIS — C679 Malignant neoplasm of bladder, unspecified: Secondary | ICD-10-CM | POA: Insufficient documentation

## 2019-09-15 DIAGNOSIS — I4940 Unspecified premature depolarization: Secondary | ICD-10-CM | POA: Insufficient documentation

## 2019-09-15 DIAGNOSIS — N184 Chronic kidney disease, stage 4 (severe): Secondary | ICD-10-CM | POA: Insufficient documentation

## 2019-09-15 DIAGNOSIS — R002 Palpitations: Secondary | ICD-10-CM | POA: Diagnosis not present

## 2019-09-15 DIAGNOSIS — G4719 Other hypersomnia: Secondary | ICD-10-CM | POA: Insufficient documentation

## 2019-09-15 DIAGNOSIS — G4710 Hypersomnia, unspecified: Secondary | ICD-10-CM | POA: Insufficient documentation

## 2019-09-15 NOTE — Procedures (Signed)
PATIENT'S NAME:  [name] [name], [name] [name] DOB:      06-18-1932      MR#:    [id number]     DATE OF RECORDING: 09/10/2019  AL REFERRING M.D.:  [name] MD Study Performed:   Baseline Polysomnogram HISTORY:  85 y.o. year old Caucasian male patient was seen on 08/21/2019, upon referral from Dr. Ashby Dawes, MD.   Chief concern according to patient: " having spells that make me feel completely drained of energy".   I have the pleasure of seeing [name] [name] Leisure today, a right -handed Caucasian male with a possible sleep disorder. He has not been walking as much over the last 3 moth, he can't do the same amount of yard work for the last 3 month, and he documented these changes in exercise tolerance this on his smart app.    His PCP had initiated a work up with cardiology and US of the lower extremities which resulted in no abnormal findings. 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  Naps are lasting from 30 to 60 minutes and are as refreshing as nocturnal sleep.  The patient endorsed the Epworth Sleepiness Scale at 12 points.   The patient's weight 201 pounds with a height of 69 (inches), resulting in a BMI of 29.7 kg/m2. The patient's neck circumference measured 16 inches.  CURRENT MEDICATIONS: Aspirin, Proscar, Avapro, Synthroid, Protonix, Crestor, Rapaflo   PROCEDURE:  This is a multichannel digital polysomnogram utilizing the Somnostar 11.2 system.  Electrodes and sensors were applied and monitored per AASM Specifications.   EEG, EOG, Chin and Limb EMG, were sampled at 200 Hz.  ECG, Snore and Nasal Pressure, Thermal  Airflow, Respiratory Effort, CPAP Flow and Pressure, Oximetry was sampled at 50 Hz. Digital video and audio were recorded.      BASELINE STUDY: Lights Out was at 21:34 and Lights On at 04:59.  Total recording time (TRT) was 446 minutes, with a total sleep time (TST) of 333.5 minutes.   The patient's sleep latency was 62 minutes.  REM latency was 183 minutes.  The sleep efficiency was 74.8 %.     SLEEP ARCHITECTURE: WASO (Wake after sleep onset) was 96 minutes.  There were 66.5 minutes in Stage N1, 155.5 minutes Stage N2, 66.5 minutes Stage N3 and 45 minutes in Stage REM.  The percentage of Stage N1 was 19.9%, Stage N2 was 46.6%, Stage N3 was 19.9% and Stage R (REM sleep) was 13.5%.   RESPIRATORY ANALYSIS:  There were a total of 41 respiratory events:  25 obstructive apneas, 6 central apneas and 7 mixed apneas with a total of 38 apneas and an apnea index (AI) of 6.8 /hour. There were 3 hypopneas with a hypopnea index of 0.5 /hour. The total APNEA/HYPOPNEA INDEX (AHI) was 7.4 /hour.  1 event occurred in REM sleep and 17 events in NREM. The REM AHI was 1.3/hour, versus a non-REM AHI of 8.3/h. The patient spent 133.5 minutes of total sleep time in the supine position and 200 minutes in non-supine. The supine AHI was 16.6/h versus a non-supine AHI of 1.2/h.  OXYGEN SATURATION & C02:  The Wake baseline 02 saturation was 93%, with the lowest being 70%. Time spent below 89% saturation equaled 2 minutes.  The patient had a total of 203 Periodic Limb Movements.  The Periodic Limb Movement (PLM) index was 36.5 and the PLM Arousal index was 4.3/hour. The arousals were noted as: 66 were spontaneous, 24 were associated with PLMs, and 33 were associated with respiratory events.  Audio and video analysis did show phonations / vocalizations in NREM sleep.   The patient took bathroom breaks. Snoring was moderately loud. EKG was in keeping with normal sinus rhythm (NSR).  IMPRESSION:  1. Mild Obstructive Sleep  Apnea (OSA) that exacerbated in supine sleep. No prolonged hypoxia.  2. Severe Periodic Limb Movement Disorder (PLMD). 3. Snoring. 4. Abnormal EKG- PACs and PVCs.    RECOMMENDATIONS:  Advise full-night, attended, CPAP titration study to optimize therapy.     I certify that I have reviewed the entire raw data recording prior to the issuance of this report in accordance with the Standards of Accreditation of the American Academy of Sleep Medicine (AASM)    Larey Seat, MD    09-15-2019 Diplomat, American Board of Psychiatry and Neurology  Diplomat, American Board of [place] Director, [place] [facility] [employer]

## 2019-09-15 NOTE — Addendum Note (Signed)
Addended by: Larey Seat on: 09/15/2019 02:43 PM   Modules accepted: Orders

## 2019-09-19 ENCOUNTER — Telehealth: Payer: Self-pay | Admitting: Neurology

## 2019-09-19 NOTE — Telephone Encounter (Signed)
I called pt. I advised pt that Dr. Dohmeier reviewed their sleep study results and found that has mild sleep apnea and recommends that pt be treated with a cpap. Dr. Dohmeier recommends that pt return for a repeat sleep study in order to properly titrate the cpap and ensure a good mask fit. Pt is agreeable to returning for a titration study. I advised pt that our sleep lab will file with pt's insurance and call pt to schedule the sleep study when we hear back from the pt's insurance regarding coverage of this sleep study. Pt verbalized understanding of results. Pt had no questions at this time but was encouraged to call back if questions arise.   

## 2019-09-19 NOTE — Telephone Encounter (Signed)
-----   Message from Gildardo Griffes, RN sent at 09/18/2019 12:16 PM EDT -----  ----- Message ----- From: Larey Seat, MD Sent: 09/15/2019   2:43 PM EDT To: Darleen Crocker, RN   IMPRESSION:  1. Mild Obstructive Sleep Apnea (OSA) that exacerbated in supine sleep. No prolonged hypoxia.  2. Severe Periodic Limb Movement Disorder (PLMD). 3. Snoring. 4. Abnormal EKG- PACs and PVCs.    RECOMMENDATIONS:  Advise full-night, attended, CPAP titration study to optimize therapy.

## 2019-09-28 DIAGNOSIS — R42 Dizziness and giddiness: Secondary | ICD-10-CM | POA: Diagnosis not present

## 2019-09-28 DIAGNOSIS — R82998 Other abnormal findings in urine: Secondary | ICD-10-CM | POA: Diagnosis not present

## 2019-09-28 DIAGNOSIS — R5383 Other fatigue: Secondary | ICD-10-CM | POA: Diagnosis not present

## 2019-09-28 DIAGNOSIS — R600 Localized edema: Secondary | ICD-10-CM | POA: Diagnosis not present

## 2019-09-28 DIAGNOSIS — R002 Palpitations: Secondary | ICD-10-CM | POA: Diagnosis not present

## 2019-10-02 DIAGNOSIS — R002 Palpitations: Secondary | ICD-10-CM | POA: Diagnosis not present

## 2019-10-06 ENCOUNTER — Other Ambulatory Visit (HOSPITAL_COMMUNITY)
Admission: RE | Admit: 2019-10-06 | Discharge: 2019-10-06 | Disposition: A | Discharge status: Home / Self Care | Payer: Medicare Other | Source: Ambulatory Visit | Attending: Neurology | Admitting: Neurology

## 2019-10-06 DIAGNOSIS — Z01812 Encounter for preprocedural laboratory examination: Secondary | ICD-10-CM | POA: Insufficient documentation

## 2019-10-06 DIAGNOSIS — Z20828 Contact with and (suspected) exposure to other viral communicable diseases: Secondary | ICD-10-CM | POA: Diagnosis not present

## 2019-10-07 LAB — SARS CORONAVIRUS 2 (TAT 6-24 HRS): SARS Coronavirus 2: NEGATIVE

## 2019-10-09 ENCOUNTER — Ambulatory Visit (INDEPENDENT_AMBULATORY_CARE_PROVIDER_SITE_OTHER): Payer: Medicare Other | Admitting: Neurology

## 2019-10-09 DIAGNOSIS — G4731 Primary central sleep apnea: Secondary | ICD-10-CM

## 2019-10-09 DIAGNOSIS — G4710 Hypersomnia, unspecified: Secondary | ICD-10-CM

## 2019-10-09 DIAGNOSIS — G4739 Other sleep apnea: Secondary | ICD-10-CM

## 2019-10-09 DIAGNOSIS — G4719 Other hypersomnia: Secondary | ICD-10-CM

## 2019-10-09 DIAGNOSIS — I4940 Unspecified premature depolarization: Secondary | ICD-10-CM

## 2019-10-09 DIAGNOSIS — C679 Malignant neoplasm of bladder, unspecified: Secondary | ICD-10-CM

## 2019-10-09 DIAGNOSIS — R5383 Other fatigue: Secondary | ICD-10-CM

## 2019-10-09 DIAGNOSIS — G4730 Sleep apnea, unspecified: Secondary | ICD-10-CM

## 2019-10-16 ENCOUNTER — Telehealth: Payer: Self-pay | Admitting: Neurology

## 2019-10-16 DIAGNOSIS — G4739 Other sleep apnea: Secondary | ICD-10-CM | POA: Insufficient documentation

## 2019-10-16 NOTE — Procedures (Signed)
PATIENT'S NAME:  [name] [name], [name] [name] DOB:      10/13/1932      MR#:    [id number]     DATE OF RECORDING: 10/09/2019 [facility]  REFERRING M.D.:  [name] MD Study Performed:   Titration to CPAP and BiPAP [name] [name], a 85 year old male patient, returns for a full-night, attended, CPAP titration study to optimize therapy after his PSG from 09/10/2019 had revealed an AHI of 7.4/h, supine AHI of 16.6/h and an oxygen nadir of 70%. Dx: Mild Obstructive Sleep Apnea (OSA) that exacerbated in supine sleep. No prolonged hypoxia. Severe Periodic Limb Movement Disorder (PLMD). Snoring. Abnormal EKG- PACs and PVCs. The patient endorsed the Epworth Sleepiness Scale at 12/24 points .   The patient's weight 200 pounds with a height of 69 (inches), resulting in a BMI of 29.7 kg/m2. The patient's neck circumference measured 16 inches.  CURRENT MEDICATIONS: Aspirin, Proscar, Avapro, Synthroid, Protonix, Crestor, Rapallo   PROCEDURE:  This is a multichannel digital polysomnogram utilizing the SomnoStar 11.2 system.  Electrodes and sensors were applied and monitored per AASM Specifications.   EEG, EOG, Chin and Limb EMG, were sampled at 200 Hz.  ECG, Snore and Nasal Pressure, Thermal Airflow, Respiratory Effort, CPAP Flow and Pressure, Oximetry was sampled at 50 Hz. Digital video and audio were recorded.      CPAP was initiated at 5 cmH20 with heated humidity per AASM split night standards and pressure was advanced to 10 cm H20 because of hypopneas, apneas and desaturations.   At a BIPAP pressure of  cmH20, there was a reduction of the AHI to 0 with improvement of sleep apnea. A ResMed Mirage Quattro full face mask in medium size was given to the patient.   Lights Out was at 21:42 and Lights On at 05:12. Total recording time (TRT) was 451 minutes, with a total sleep time (TST) of 352.5 minutes. The patient's sleep latency was 2 minutes.  REM latency was 259.5 minutes.  The sleep efficiency was 78.2 %.    SLEEP  ARCHITECTURE: WASO (Wake after sleep onset)  was 91.5 minutes.  There were 29 minutes in Stage N1, 292.5 minutes Stage N2, 0 minutes Stage N3 and 31 minutes in Stage REM.  The percentage of Stage N1 was 8.2%, Stage N2 was 83.%, Stage N3 was 0% and Stage R (REM sleep) was 8.8%.   RESPIRATORY ANALYSIS:  There was a total of 77 respiratory events: 16 obstructive apneas, 60 central apneas and 0 mixed apneas with a total of 76 apneas.  There was 1 hypopnea with 0 respiratory event related arousals (RERAs).     The total APNEA/HYPOPNEA INDEX  (AHI) was 13.1 /hour.  1 event occurred in REM sleep and 76 events in NREM. The REM AHI was 1.9 /hour versus a non-REM AHI of 14.2 /hour.  The patient spent 253 minutes of total sleep time in the supine position and 100 minutes in non-supine. The supine AHI was 17.0/h, versus a non-supine position AHI of 3.0/h.  OXYGEN SATURATION & C02:  The baseline 02 saturation was 96%, with the lowest being 91%. Time spent below 89% saturation equaled 0 minutes.  The arousals were noted as: 64 were spontaneous, 93 were associated with PLMs, 38 were associated with respiratory events. The patient had a total of 782 Periodic Limb Movements. The Periodic Limb Movement (PLM) index was 133.1 and the PLM Arousal index was 15.8 /hour. Audio and video analysis did not show any abnormal or unusual movements, behaviors, phonations  or vocalizations. The patient was restless. There were no bathroom breaks. EKG showed irregular rhythm at the beginning of the study, screen shot attached to technical report.  A ResMed Mirage Quattro full face mask in medium size was given to the patient.   DIAGNOSIS 1. Central Sleep Apnea  2. Periodic Limb Movement Disorder  3. Non-specific abnormal EKG  PLANS/RECOMMENDATIONS:  Still very restless sleep with a high number of PLMs. The patient did not respond well to CPAP between 5 and 12 cm water (central apnea emerged) and was switched to BiPAP. Under A  BiPAP pressure of 16/ 12 cm water, there were only 7 minutes of sleep observed but the AHI was 0.0/h.  A ResMed Mirage Quattro full face mask in medium size was given to the patient.   I plan to use an auto BiPAP with a 4 cm water pressure spread for this patient.   A follow up appointment will be scheduled in the Sleep Clinic at [facility].   Please call [phone] with any questions.     I certify that I have reviewed the entire raw data recording prior to the issuance of this report in accordance with the Standards of Accreditation of the American Academy of Sleep Medicine (AASM)   Larey Seat, M.D. Diplomat, Tax adviser of Psychiatry and Neurology  Diplomat, Tax adviser of Sleep Medicine Market researcher, [facility] [employer]

## 2019-10-16 NOTE — Telephone Encounter (Signed)
-----   Message from Larey Seat, MD sent at 10/16/2019 10:13 AM EST ----- DIAGNOSIS  1. Central Sleep Apnea  2. Periodic Limb Movement Disorder  3. Non-specific abnormal EKG -DIAGNOSIS   PLANS/RECOMMENDATIONS:   Still very restless sleep with a high number of PLMs. The patient  did not respond well to CPAP between 5 and 12 cm water (central  apnea emerged) and was switched to BiPAP. Under A BiPAP pressure  of 16/ 12 cm water, there were only 7 minutes of sleep observed  but the AHI was 0.0/h.  A ResMed Mirage Quattro full face mask in medium size was given  to the patient.   I plan to use an auto BiPAP with a 4 cm water pressure spread for  this patient as a window of 14/10 cm water pressure  through 18/ 14 cm water pressure BiPAP.  Order was placed in [place]- DME.   Cc Dr Ashby Dawes, RN: please forward.

## 2019-10-16 NOTE — Telephone Encounter (Signed)
I called [name] [name]. I advised [name] [name] that Dr. Brett Fairy reviewed their sleep study results and found that [name] [name] was best treated. Dr. Brett Fairy recommends that [name] [name] starts a auto BiPAP. I reviewed PAP compliance expectations with the [name] [name]. [name] [name] is agreeable to starting a CPAP. I advised [name] [name] that an order will be sent to a DME, [facility], and [facility] will call the [name] [name] within about one week after they file with the [name] [name]'s insurance. [facility] will show the [name] [name] how to use the machine, fit for masks, and troubleshoot the CPAP if needed. A follow up appt was made for insurance purposes with Dr. Brett Fairy on Feb 3,2021. [name] [name] verbalized understanding to arrive 15 minutes early and bring their CPAP. A letter with all of this information in it will be mailed to the [name] [name] as a reminder. I verified with the [name] [name] that the address we have on file is correct. [name] [name] verbalized understanding of results. [name] [name] had no questions at this time but was encouraged to call back if questions arise. I have sent the order to [facility] and have received confirmation that they have received the order.

## 2019-10-16 NOTE — Telephone Encounter (Signed)
Called patient to discuss sleep study results. No answer at this time. LVM for the patient to call back.   

## 2019-10-16 NOTE — Addendum Note (Signed)
Addended by: Larey Seat on: 10/16/2019 10:13 AM   Modules accepted: Orders

## 2019-10-16 NOTE — Telephone Encounter (Signed)
Pt returned call, left callback information on voicemail, stated he will be at home the rest of day  CB# [phone]

## 2019-10-25 ENCOUNTER — Telehealth: Payer: Self-pay | Admitting: Neurology

## 2019-10-25 NOTE — Telephone Encounter (Signed)
Pt called wanting to speak to RN about the letter that was sent to him about his Bipap machine. Please advise.

## 2019-10-25 NOTE — Telephone Encounter (Signed)
I returned the call to the patient.  Says he has not heard from [facility] yet.  I did a conference call with the patient and [facility] to assist.  They have been trying to reach him since 10/20/2019 on his mobile number.  I also provided them with his home number.  They discussed his financial information and he agreed to the cost.  He has been scheduled to pick up his machine on 10/27/2019 at 11am.     He is aware to keep his appt on 12/27/2019.

## 2019-10-27 DIAGNOSIS — G4737 Central sleep apnea in conditions classified elsewhere: Secondary | ICD-10-CM | POA: Diagnosis not present

## 2019-10-27 DIAGNOSIS — R82998 Other abnormal findings in urine: Secondary | ICD-10-CM | POA: Diagnosis not present

## 2019-10-27 DIAGNOSIS — I10 Essential (primary) hypertension: Secondary | ICD-10-CM | POA: Diagnosis not present

## 2019-10-27 DIAGNOSIS — G4733 Obstructive sleep apnea (adult) (pediatric): Secondary | ICD-10-CM | POA: Diagnosis not present

## 2019-11-03 DIAGNOSIS — I10 Essential (primary) hypertension: Secondary | ICD-10-CM | POA: Diagnosis not present

## 2019-11-03 DIAGNOSIS — I129 Hypertensive chronic kidney disease with stage 1 through stage 4 chronic kidney disease, or unspecified chronic kidney disease: Secondary | ICD-10-CM | POA: Diagnosis not present

## 2019-11-03 DIAGNOSIS — E782 Mixed hyperlipidemia: Secondary | ICD-10-CM | POA: Diagnosis not present

## 2019-11-03 DIAGNOSIS — E039 Hypothyroidism, unspecified: Secondary | ICD-10-CM | POA: Diagnosis not present

## 2019-11-03 DIAGNOSIS — R7309 Other abnormal glucose: Secondary | ICD-10-CM | POA: Diagnosis not present

## 2019-11-09 DIAGNOSIS — R310 Gross hematuria: Secondary | ICD-10-CM | POA: Diagnosis not present

## 2019-11-15 ENCOUNTER — Other Ambulatory Visit: Payer: Self-pay | Admitting: Urology

## 2019-11-16 ENCOUNTER — Other Ambulatory Visit: Payer: Self-pay | Admitting: Urology

## 2019-11-27 DIAGNOSIS — G4733 Obstructive sleep apnea (adult) (pediatric): Secondary | ICD-10-CM | POA: Diagnosis not present

## 2019-11-27 DIAGNOSIS — G4737 Central sleep apnea in conditions classified elsewhere: Secondary | ICD-10-CM | POA: Diagnosis not present

## 2019-12-01 ENCOUNTER — Encounter (HOSPITAL_BASED_OUTPATIENT_CLINIC_OR_DEPARTMENT_OTHER): Payer: Self-pay | Admitting: Urology

## 2019-12-01 ENCOUNTER — Other Ambulatory Visit: Payer: Self-pay

## 2019-12-01 NOTE — Progress Notes (Addendum)
Spoke w/ via phone for pre-op interview---[name] [name] needs dos---- I stat8 , ekg             [name] results------ COVID test ------12-05-2019 Arrive at -------530 am 12-08-2019 NPO after ------midnight Medications to take morning of surgery -----levothyroxine Diabetic medication ----- Patient Special Instructions -n/a---- Pre-Op special Istructions -----bring cpap mask and tubing Ring stuck left 4th finger Patient verbalized understanding of instructions that were given at this phone interview. Patient denies shortness of breath, chest pain, fever, cough a this phone interview.

## 2019-12-05 ENCOUNTER — Other Ambulatory Visit (HOSPITAL_COMMUNITY)
Admission: RE | Admit: 2019-12-05 | Discharge: 2019-12-05 | Disposition: A | Discharge status: Home / Self Care | Payer: Medicare Other | Source: Ambulatory Visit | Attending: Urology | Admitting: Urology

## 2019-12-05 DIAGNOSIS — Z20822 Contact with and (suspected) exposure to covid-19: Secondary | ICD-10-CM | POA: Diagnosis not present

## 2019-12-05 DIAGNOSIS — Z01812 Encounter for preprocedural laboratory examination: Secondary | ICD-10-CM | POA: Insufficient documentation

## 2019-12-06 LAB — NOVEL CORONAVIRUS, NAA (HOSP ORDER, SEND-OUT TO REF LAB; TAT 18-24 HRS): SARS-CoV-2, NAA: NOT DETECTED

## 2019-12-07 NOTE — Anesthesia Preprocedure Evaluation (Addendum)
Anesthesia Evaluation  Patient identified by MRN, date of birth, ID band Patient awake    Reviewed: Allergy & Precautions, NPO status , Patient's Chart, lab work & pertinent test results  History of Anesthesia Complications Negative for: history of anesthetic complications  Airway Mallampati: II  TM Distance: >3 FB Neck ROM: Full    Dental  (+) Dental Advisory Given, Teeth Intact   Pulmonary sleep apnea and Continuous Positive Airway Pressure Ventilation ,    Pulmonary exam normal        Cardiovascular hypertension, Pt. on medications Normal cardiovascular exam     Neuro/Psych negative neurological ROS  negative psych ROS   GI/Hepatic Neg liver ROS, GERD  Medicated and Controlled,  Endo/Other  Hypothyroidism   Renal/GU CRFRenal disease    Incontinence BPH Bladder cancer s/p TURBT     Musculoskeletal  (+) Arthritis ,   Abdominal   Peds  Hematology negative hematology ROS (+)   Anesthesia Other Findings Covid neg 1/12  Reproductive/Obstetrics                            Anesthesia Physical Anesthesia Plan  ASA: III  Anesthesia Plan: General   Post-op Pain Management:    Induction: Intravenous  PONV Risk Score and Plan: 3 and Treatment may vary due to age or medical condition and Ondansetron  Airway Management Planned: LMA  Additional Equipment: None  Intra-op Plan:   Post-operative Plan: Extubation in OR  Informed Consent: I have reviewed the patients History and Physical, chart, labs and discussed the procedure including the risks, benefits and alternatives for the proposed anesthesia with the patient or authorized representative who has indicated his/her understanding and acceptance.     Dental advisory given  Plan Discussed with: CRNA and Anesthesiologist  Anesthesia Plan Comments:        Anesthesia Quick Evaluation

## 2019-12-08 ENCOUNTER — Ambulatory Visit (HOSPITAL_BASED_OUTPATIENT_CLINIC_OR_DEPARTMENT_OTHER): Payer: Medicare Other | Admitting: Anesthesiology

## 2019-12-08 ENCOUNTER — Other Ambulatory Visit: Payer: Self-pay

## 2019-12-08 ENCOUNTER — Encounter (HOSPITAL_BASED_OUTPATIENT_CLINIC_OR_DEPARTMENT_OTHER): Payer: Self-pay | Admitting: Urology

## 2019-12-08 ENCOUNTER — Ambulatory Visit (HOSPITAL_BASED_OUTPATIENT_CLINIC_OR_DEPARTMENT_OTHER)
Admission: RE | Admit: 2019-12-08 | Discharge: 2019-12-08 | Disposition: A | Discharge status: Home / Self Care | Payer: Medicare Other | Attending: Urology | Admitting: Urology

## 2019-12-08 ENCOUNTER — Encounter (HOSPITAL_BASED_OUTPATIENT_CLINIC_OR_DEPARTMENT_OTHER)
Admission: RE | Disposition: A | Discharge status: Home / Self Care | Payer: Self-pay | Source: Home / Self Care | Attending: Urology

## 2019-12-08 DIAGNOSIS — Z8249 Family history of ischemic heart disease and other diseases of the circulatory system: Secondary | ICD-10-CM | POA: Diagnosis not present

## 2019-12-08 DIAGNOSIS — G4730 Sleep apnea, unspecified: Secondary | ICD-10-CM | POA: Insufficient documentation

## 2019-12-08 DIAGNOSIS — Z86010 Personal history of colonic polyps: Secondary | ICD-10-CM | POA: Diagnosis not present

## 2019-12-08 DIAGNOSIS — Z79899 Other long term (current) drug therapy: Secondary | ICD-10-CM | POA: Diagnosis not present

## 2019-12-08 DIAGNOSIS — K219 Gastro-esophageal reflux disease without esophagitis: Secondary | ICD-10-CM | POA: Insufficient documentation

## 2019-12-08 DIAGNOSIS — N400 Enlarged prostate without lower urinary tract symptoms: Secondary | ICD-10-CM | POA: Insufficient documentation

## 2019-12-08 DIAGNOSIS — C61 Malignant neoplasm of prostate: Secondary | ICD-10-CM | POA: Insufficient documentation

## 2019-12-08 DIAGNOSIS — D303 Benign neoplasm of bladder: Secondary | ICD-10-CM | POA: Diagnosis not present

## 2019-12-08 DIAGNOSIS — N183 Chronic kidney disease, stage 3 unspecified: Secondary | ICD-10-CM | POA: Diagnosis not present

## 2019-12-08 DIAGNOSIS — M1990 Unspecified osteoarthritis, unspecified site: Secondary | ICD-10-CM | POA: Diagnosis not present

## 2019-12-08 DIAGNOSIS — Z9079 Acquired absence of other genital organ(s): Secondary | ICD-10-CM | POA: Insufficient documentation

## 2019-12-08 DIAGNOSIS — E785 Hyperlipidemia, unspecified: Secondary | ICD-10-CM | POA: Diagnosis not present

## 2019-12-08 DIAGNOSIS — Z8551 Personal history of malignant neoplasm of bladder: Secondary | ICD-10-CM | POA: Diagnosis not present

## 2019-12-08 DIAGNOSIS — C678 Malignant neoplasm of overlapping sites of bladder: Secondary | ICD-10-CM | POA: Diagnosis not present

## 2019-12-08 DIAGNOSIS — Z79890 Hormone replacement therapy (postmenopausal): Secondary | ICD-10-CM | POA: Diagnosis not present

## 2019-12-08 DIAGNOSIS — I129 Hypertensive chronic kidney disease with stage 1 through stage 4 chronic kidney disease, or unspecified chronic kidney disease: Secondary | ICD-10-CM | POA: Diagnosis not present

## 2019-12-08 DIAGNOSIS — Z20822 Contact with and (suspected) exposure to covid-19: Secondary | ICD-10-CM | POA: Diagnosis not present

## 2019-12-08 DIAGNOSIS — E039 Hypothyroidism, unspecified: Secondary | ICD-10-CM | POA: Diagnosis not present

## 2019-12-08 DIAGNOSIS — D090 Carcinoma in situ of bladder: Secondary | ICD-10-CM | POA: Diagnosis not present

## 2019-12-08 DIAGNOSIS — R310 Gross hematuria: Secondary | ICD-10-CM | POA: Diagnosis not present

## 2019-12-08 HISTORY — PX: CYSTOSCOPY WITH FULGERATION: SHX6638

## 2019-12-08 HISTORY — DX: Sleep apnea, unspecified: G47.30

## 2019-12-08 HISTORY — PX: TRANSURETHRAL RESECTION OF BLADDER TUMOR: SHX2575

## 2019-12-08 LAB — POCT I-STAT, CHEM 8
BUN: 27 mg/dL — ABNORMAL HIGH (ref 8–23)
Calcium, Ion: 1.18 mmol/L (ref 1.15–1.40)
Chloride: 107 mmol/L (ref 98–111)
Creatinine, Ser: 1.6 mg/dL — ABNORMAL HIGH (ref 0.61–1.24)
Glucose, Bld: 112 mg/dL — ABNORMAL HIGH (ref 70–99)
HCT: 43 % (ref 39.0–52.0)
Hemoglobin: 14.6 g/dL (ref 13.0–17.0)
Potassium: 3.9 mmol/L (ref 3.5–5.1)
Sodium: 142 mmol/L (ref 135–145)
TCO2: 22 mmol/L (ref 22–32)

## 2019-12-08 SURGERY — TURBT (TRANSURETHRAL RESECTION OF BLADDER TUMOR)
Anesthesia: General | Site: Bladder

## 2019-12-08 MED ORDER — HYDROCODONE-ACETAMINOPHEN 5-325 MG PO TABS: 1.0000 | ORAL_TABLET | Freq: Four times a day (QID) | ORAL | 0 refills | Status: DC | PRN

## 2019-12-08 MED ORDER — FENTANYL CITRATE (PF) 100 MCG/2ML IJ SOLN
INTRAMUSCULAR | Status: DC | PRN
  Administered 2019-12-08 (×3): 25 ug via INTRAVENOUS

## 2019-12-08 MED ORDER — PROPOFOL 10 MG/ML IV BOLUS
INTRAVENOUS | Status: AC
  Filled 2019-12-08: qty 40

## 2019-12-08 MED ORDER — GENTAMICIN SULFATE 40 MG/ML IJ SOLN
5.0000 mg/kg | Freq: Once | INTRAVENOUS | Status: AC
  Administered 2019-12-08: 453.5 mg via INTRAVENOUS
  Filled 2019-12-08 (×2): qty 11.25

## 2019-12-08 MED ORDER — DEXAMETHASONE SODIUM PHOSPHATE 10 MG/ML IJ SOLN
INTRAMUSCULAR | Status: AC
  Filled 2019-12-08: qty 1

## 2019-12-08 MED ORDER — FENTANYL CITRATE (PF) 100 MCG/2ML IJ SOLN
25.0000 ug | INTRAMUSCULAR | Status: DC | PRN
  Administered 2019-12-08 (×2): 25 ug via INTRAVENOUS
  Filled 2019-12-08: qty 1

## 2019-12-08 MED ORDER — EPHEDRINE 5 MG/ML INJ
INTRAVENOUS | Status: AC
  Filled 2019-12-08: qty 10

## 2019-12-08 MED ORDER — CEFTRIAXONE SODIUM 2 G IJ SOLR
INTRAMUSCULAR | Status: AC
  Filled 2019-12-08: qty 20

## 2019-12-08 MED ORDER — LIDOCAINE 2% (20 MG/ML) 5 ML SYRINGE
INTRAMUSCULAR | Status: DC | PRN
  Administered 2019-12-08: 100 mg via INTRAVENOUS

## 2019-12-08 MED ORDER — EPHEDRINE SULFATE-NACL 50-0.9 MG/10ML-% IV SOSY: PREFILLED_SYRINGE | INTRAVENOUS | Status: DC | PRN

## 2019-12-08 MED ORDER — PHENYLEPHRINE 40 MCG/ML (10ML) SYRINGE FOR IV PUSH (FOR BLOOD PRESSURE SUPPORT)
PREFILLED_SYRINGE | INTRAVENOUS | Status: AC
  Filled 2019-12-08: qty 10

## 2019-12-08 MED ORDER — SODIUM CHLORIDE 0.9 % IV SOLN
INTRAVENOUS | Status: AC
  Filled 2019-12-08: qty 100

## 2019-12-08 MED ORDER — SODIUM CHLORIDE 0.9 % IV SOLN
INTRAVENOUS | Status: DC
  Filled 2019-12-08: qty 1000

## 2019-12-08 MED ORDER — SODIUM CHLORIDE 0.9 % IV SOLN
2.0000 g | INTRAVENOUS | Status: AC
  Administered 2019-12-08: 2 g via INTRAVENOUS
  Filled 2019-12-08: qty 20

## 2019-12-08 MED ORDER — OXYCODONE HCL 5 MG/5ML PO SOLN
5.0000 mg | Freq: Once | ORAL | Status: DC | PRN
  Filled 2019-12-08: qty 5

## 2019-12-08 MED ORDER — PROPOFOL 10 MG/ML IV BOLUS
INTRAVENOUS | Status: DC | PRN
  Administered 2019-12-08: 20 mg via INTRAVENOUS
  Administered 2019-12-08: 30 mg via INTRAVENOUS
  Administered 2019-12-08: 100 mg via INTRAVENOUS

## 2019-12-08 MED ORDER — OXYCODONE HCL 5 MG PO TABS
5.0000 mg | ORAL_TABLET | Freq: Once | ORAL | Status: DC | PRN
  Filled 2019-12-08: qty 1

## 2019-12-08 MED ORDER — ONDANSETRON HCL 4 MG/2ML IJ SOLN
INTRAMUSCULAR | Status: AC
  Filled 2019-12-08: qty 2

## 2019-12-08 MED ORDER — DEXAMETHASONE SODIUM PHOSPHATE 10 MG/ML IJ SOLN
INTRAMUSCULAR | Status: DC | PRN
  Administered 2019-12-08: 4 mg via INTRAVENOUS

## 2019-12-08 MED ORDER — PHENYLEPHRINE 40 MCG/ML (10ML) SYRINGE FOR IV PUSH (FOR BLOOD PRESSURE SUPPORT)
PREFILLED_SYRINGE | INTRAVENOUS | Status: DC | PRN
  Administered 2019-12-08: 80 ug via INTRAVENOUS
  Administered 2019-12-08: 120 ug via INTRAVENOUS
  Administered 2019-12-08: 80 ug via INTRAVENOUS
  Administered 2019-12-08: 120 ug via INTRAVENOUS
  Administered 2019-12-08 (×2): 80 ug via INTRAVENOUS
  Administered 2019-12-08: 40 ug via INTRAVENOUS
  Administered 2019-12-08: 80 ug via INTRAVENOUS

## 2019-12-08 MED ORDER — ONDANSETRON HCL 4 MG/2ML IJ SOLN
4.0000 mg | Freq: Once | INTRAMUSCULAR | Status: DC | PRN
  Filled 2019-12-08: qty 2

## 2019-12-08 MED ORDER — FENTANYL CITRATE (PF) 100 MCG/2ML IJ SOLN
INTRAMUSCULAR | Status: AC
  Filled 2019-12-08: qty 2

## 2019-12-08 MED ORDER — LIDOCAINE 2% (20 MG/ML) 5 ML SYRINGE
INTRAMUSCULAR | Status: AC
  Filled 2019-12-08: qty 5

## 2019-12-08 MED ORDER — SODIUM CHLORIDE 0.9 % IR SOLN
Status: DC | PRN
  Administered 2019-12-08: 6000 mL via INTRAVESICAL

## 2019-12-08 MED ORDER — ONDANSETRON HCL 4 MG/2ML IJ SOLN
INTRAMUSCULAR | Status: DC | PRN
  Administered 2019-12-08: 4 mg via INTRAVENOUS

## 2019-12-08 SURGICAL SUPPLY — 22 items
BAG DRAIN URO-CYSTO SKYTR STRL (DRAIN) ×4 IMPLANT
BAG DRN RND TRDRP ANRFLXCHMBR (UROLOGICAL SUPPLIES) ×2
BAG DRN UROCATH (DRAIN) ×2
BAG URINE DRAIN 2000ML AR STRL (UROLOGICAL SUPPLIES) ×4 IMPLANT
BAG URINE LEG 500ML (DRAIN) IMPLANT
CATH FOLEY 3WAY 30CC 22F (CATHETERS) ×4 IMPLANT
CLOTH BEACON ORANGE TIMEOUT ST (SAFETY) ×4 IMPLANT
ELECT REM PT RETURN 9FT ADLT (ELECTROSURGICAL) ×4
ELECTRODE REM PT RTRN 9FT ADLT (ELECTROSURGICAL) IMPLANT
GLOVE BIO SURGEON STRL SZ8 (GLOVE) ×4 IMPLANT
GOWN STRL REUS W/TWL XL LVL3 (GOWN DISPOSABLE) ×4 IMPLANT
HOLDER FOLEY CATH W/STRAP (MISCELLANEOUS) ×4 IMPLANT
KIT TURNOVER CYSTO (KITS) ×4 IMPLANT
MANIFOLD NEPTUNE II (INSTRUMENTS) ×4 IMPLANT
PACK CYSTO (CUSTOM PROCEDURE TRAY) ×4 IMPLANT
PLUG CATH AND CAP STER (CATHETERS) ×4 IMPLANT
SYR 30ML LL (SYRINGE) ×4 IMPLANT
SYR TOOMEY IRRIG 70ML (MISCELLANEOUS) ×4
SYRINGE TOOMEY IRRIG 70ML (MISCELLANEOUS) IMPLANT
TUBE CONNECTING 12'X1/4 (SUCTIONS) ×1
TUBE CONNECTING 12X1/4 (SUCTIONS) ×1 IMPLANT
TUBING UROLOGY SET (TUBING) ×4 IMPLANT

## 2019-12-08 NOTE — Op Note (Signed)
.  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 Transurethral resection of bladder tumor, large 3. Transurethral resection of the prostate  Attending: Rosie Fate  Anesthesia: General  Estimated blood loss: Minimal  Drains: 22 French foley  Specimens: bladder and prostatic urethra tumor  Antibiotics: rocephin  Findings:  5cm papillary tumor involving trigone, bladder neck and right prostatic lobe.  Ureteral orifices in normal anatomic location.   Indications: Patient is a 86 year old male with a history of gross hematuria.  After discussing treatment options, they decided proceed with cystoscopy, fulgeration, possible transurethral resection of prostate and possible transurethral resection of a bladder tumor.  Procedure her in detail: The patient was brought to the operating room and a brief timeout was done to ensure correct patient, correct procedure, correct site.  General anesthesia was administered patient was placed in dorsal lithotomy position.  Their genitalia was then prepped and draped in usual sterile fashion.  A rigid 32 French cystoscope was passed in the urethra and the bladder.  Bladder was inspected and we noted a 5cm bladder tumor involving trigone, bladder neck and prostatic urethra.  the ureteral orifices were in the normal orthotopic locations.  We then removed the cystoscope and placed a resectoscope into the bladder. We proceeded to remove the large clot burden from the bladder. Once this was complete we turned our attention to the bladder tumor. Using the bipolar resectoscope we removed the bladder tumor down to the base.Hemostasis was then obtained with electrocautery. We then proceeded to resect the right prostatic lobe which was covered with tumor. We then obtained hemostasis with electrocautery. We then removed the bladder tumor chips and sent them for  pathology. We then re-inspected the bladder and found no residula bleeding.  the bladder was then drained, a 22 French foley was placed and this concluded the procedure which was well tolerated by patient.  Complications: None  Condition: Stable, extubated, transferred to PACU  Plan: Patient is to be discharged home and followup in 5 days for foley catheter removal and pathology discussion.

## 2019-12-08 NOTE — Transfer of Care (Signed)
Immediate Anesthesia Transfer of Care Note  Patient: [name] [name]  Procedure(s) Performed: TRANSURETHRAL RESECTION OF BLADDER TUMOR (N/A Bladder) CYSTOSCOPY WITH FULGERATION (N/A )  Patient Location: PACU  Anesthesia Type:General  Level of Consciousness: awake, alert  and oriented  Airway & Oxygen Therapy: Patient Spontanous Breathing and Patient connected to nasal cannula oxygen  Post-op Assessment: Report given to RN  Post vital signs: Reviewed and stable  Last Vitals:  Vitals Value Taken Time  BP 137/87 12/08/19 0838  Temp    Pulse 78 12/08/19 0840  Resp 15 12/08/19 0840  SpO2 99 % 12/08/19 0840  Vitals shown include unvalidated device data.  Last Pain:  Vitals:   12/08/19 0622  TempSrc: Oral  PainSc: 0-No pain      Patients Stated Pain Goal: 5 (41/75/30 1040)  Complications: No apparent anesthesia complications

## 2019-12-08 NOTE — Progress Notes (Addendum)
Call received from Percell Miller, pt's son-in-law, stating that pt's pharmacy had not received Norco script. Called pharmacy and confirmed that they did not receive and also noted message in Epic saying that transmission to pharmacy failed. I spoke w/ Dr. Alyson Ingles and he attempted to resend script from his office, but that transmission failed as well. Returned call to son-in-law at [phone] and requested an alternate pharmacy. He requested [facility]. I returned call to Dr. Alyson Ingles and relayed information and he will resend script to [facility] as requested.  Dorrene German, RN

## 2019-12-08 NOTE — H&P (Signed)
Urology Admission H&P  Chief Complaint: gross hematuria  History of Present Illness: [name] [name] is a 86yo with a hx of BPH and bladder cancer who developed recurrent gross hematuria. Cystoscopy revealed friable prostatic urethra tissue. He continues to have gross hematuria  Past Medical History:  Diagnosis Date  . Arthritis    lower back bulging disc, hips, knees, thumbs, shoulder  . Bladder cancer ([place]) UROLOGIST-  DR Maybelle Depaoli   S/P TURBT 08-16-2017  . BPH (benign prostatic hyperplasia)   . ckd stage 3w   . Elevated PSA   . Frequency of urination   . GERD (gastroesophageal reflux disease)    on protonix  . Hematuria   . History of colon polyps   . History of dysphagia resolved  . History of esophageal dilatation    for dysplasia w/ pills  . HLD (hyperlipidemia)   . HOH (hard of hearing)    slightly hoh  . HTN (hypertension)   . Hypothyroidism   . Incontinence of urine    only wears protection if he's planning to be in public for an extended amt of time  . 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 Shoulder bursitis    undiagnosed. painful and limited ROM on left  . Sleep apnea    uses cpap  . Weakness of extremity    left knee  . Wears glasses    Past Surgical History:  Procedure Laterality Date  . CYSTOSCOPY W/ URETERAL STENT PLACEMENT Bilateral 08/16/2017   Procedure: CYSTOSCOPY WITH RETROGRADE PYELOGRAM/URETERAL STENT PLACEMENT;  Surgeon: Cleon Gustin, MD;  Location[facility];  Service: Urology;  Laterality: Bilateral;  . KNEE ARTHROSCOPY Left 06/10/2011  . PROSTATE BIOPSY  2000  . TRANSURETHRAL RESECTION OF BLADDER TUMOR N/A 08/16/2017   Procedure: TRANSURETHRAL RESECTION OF BLADDER TUMOR (TURBT);  Surgeon: Cleon Gustin, MD;  Location[facility];  Service: Urology;  Laterality: N/A;   . TRANSURETHRAL RESECTION OF BLADDER TUMOR  12-09-2012   dr Alona Bene [facility]   and TURP  . TRANSURETHRAL RESECTION OF BLADDER TUMOR N/A 09/20/2017   Procedure: TRANSURETHRAL RESECTION OF BLADDER TUMOR (TURBT);  Surgeon: Cleon Gustin, MD;  Location: [facility];  Service: Urology;  Laterality: N/A;  . TRANSURETHRAL RESECTION OF PROSTATE  2011    Home Medications:  Current Facility-Administered Medications  Medication Dose Route Frequency Provider Last Rate Last Admin  . 0.9 %  sodium chloride infusion   Intravenous Continuous Lyn Hollingshead, MD 50 mL/hr at 12/08/19 0636 New Bag at 12/08/19 0636  . cefTRIAXone (ROCEPHIN) 2 g in sodium chloride 0.9 % 100 mL IVPB  2 g Intravenous 30 min Pre-Op Cydne Grahn, Candee Furbish, MD      . gentamicin (GARAMYCIN) 450 mg in dextrose 5 % 100 mL IVPB  5 mg/kg Intravenous Once Jarrick Fjeld, Candee Furbish, MD       Allergies:  Allergies  Allergen Reactions  . Ciprofloxacin     REACTION: diarrhea.  Occurred with 750mg  dose.  Has since taken 500mg  doses and has had no reaction/diarrhea.  . Cephalexin Rash    Rash on arms    Family History  Problem Relation Age of Onset  . Hypertension Mother   . Breast cancer Mother   . Other Mother        brain tumor  . Alzheimer's disease Mother   .  Emphysema Father        smoker   Social History:  reports that he has never smoked. He has never used smokeless tobacco. He reports current alcohol use of about 1.0 - 2.0 standard drinks of alcohol per week. He reports that he does not use drugs.  Review of Systems  Genitourinary: Positive for hematuria.  All other systems reviewed and are negative.   Physical Exam:  Vital signs in last 24 hours: Temp:  [97.6 F (36.4 C)] 97.6 F (36.4 C) (01/15 0622) Pulse Rate:  [87] 87 (01/15 0622) Resp:  [16] 16 (01/15 0622) BP: (165)/(71) 165/71 (01/15 0622) Weight:  [92.1 kg] 92.1 kg (01/15 0622) Physical Exam  Constitutional: He is oriented to person,  place, and time. He appears well-developed and well-nourished.  HENT:  Head: Normocephalic and atraumatic.  Eyes: Pupils are equal, round, and reactive to light. EOM are normal.  Neck: No thyromegaly present.  Cardiovascular: Normal rate and regular rhythm.  Respiratory: Effort normal. No respiratory distress.  GI: Soft. He exhibits no distension.  Musculoskeletal:        General: No edema. Normal range of motion.     Cervical back: Normal range of motion.  Neurological: He is alert and oriented to person, place, and time.  Skin: Skin is warm and dry.  Psychiatric: He has a normal mood and affect. His behavior is normal. Judgment and thought content normal.    Laboratory Data:  Results for orders placed or performed during the hospital encounter of 12/08/19 (from the past 24 hour(s))  I-STAT, chem 8     Status: Abnormal   Collection Time: 12/08/19  6:18 AM  Result Value Ref Range   Sodium 142 135 - 145 mmol/L   Potassium 3.9 3.5 - 5.1 mmol/L   Chloride 107 98 - 111 mmol/L   BUN 27 (H) 8 - 23 mg/dL   Creatinine, Ser 1.60 (H) 0.61 - 1.24 mg/dL   Glucose, Bld 112 (H) 70 - 99 mg/dL   Calcium, Ion 1.18 1.15 - 1.40 mmol/L   TCO2 22 22 - 32 mmol/L   Hemoglobin 14.6 13.0 - 17.0 g/dL   HCT 43.0 39.0 - 52.0 %   Recent Results (from the past 240 hour(s))  Novel Coronavirus, NAA (Hosp order, Send-out to Ref Lab; TAT 18-24 hrs     Status: None   Collection Time: 12/05/19  9:53 AM   Specimen: Nasopharyngeal Swab; Respiratory  Result Value Ref Range Status   SARS-CoV-2, NAA NOT DETECTED NOT DETECTED Final    Comment: (NOTE) This nucleic acid amplification test was developed and its performance characteristics determined by [employer]. Nucleic acid amplification tests include PCR and TMA. This test has not been FDA cleared or approved. This test has been authorized by FDA under an Emergency Use Authorization (EUA). This test is only authorized for the duration of time the  declaration that circumstances exist justifying the authorization of the emergency use of in vitro diagnostic tests for detection of SARS-CoV-2 virus and/or diagnosis of COVID-19 infection under section 564(b)(1) of the Act, 21 U.S.C. 188CZY-6(A) (1), unless the authorization is terminated or revoked sooner. When diagnostic testing is negative, the possibility of a false negative result should be considered in the context of a patient's recent exposures and the presence of clinical signs and symptoms consistent with COVID-19. An individual without symptoms of COVID- 19 and who is not shedding SARS-CoV-2 vi rus would expect to have a negative (not detected) result in this assay.  Performed At: [facility] [address] [place], [place] [id number] Rush Farmer MD NT:[id number]    [place]  Final    Comment: Performed at [facility], [place] [address], [place], [place] [address]   Creatinine: Recent Labs    12/08/19 0618  CREATININE 1.60*   Baseline Creatinine: 1.5  Impression/Assessment:  87yo with BPH and gross hematuria  Plan:  The risks/benefits/alterantives to cysto, fulgeration possible TURP was explained to the patient and he understands and wishes to proceed with surgery  Nicolette Bang 12/08/2019, 7:43 AM

## 2019-12-08 NOTE — Anesthesia Postprocedure Evaluation (Signed)
Anesthesia Post Note  Patient: [name] [name]  Procedure(s) Performed: TRANSURETHRAL RESECTION OF BLADDER TUMOR (N/A Bladder) CYSTOSCOPY WITH FULGERATION (N/A )     Patient location during evaluation: PACU Anesthesia Type: General Level of consciousness: awake and alert Pain management: pain level controlled Vital Signs Assessment: post-procedure vital signs reviewed and stable Respiratory status: spontaneous breathing, nonlabored ventilation and respiratory function stable Cardiovascular status: blood pressure returned to baseline and stable Postop Assessment: no apparent nausea or vomiting Anesthetic complications: no    Last Vitals:  Vitals:   12/08/19 0930 12/08/19 0945  BP: (!) 155/80 (!) 159/75  Pulse: 68 69  Resp: 10 10  Temp:  (!) 36.4 C  SpO2: 98% 98%    Last Pain:  Vitals:   12/08/19 0930  TempSrc:   PainSc: [place]

## 2019-12-08 NOTE — Discharge Instructions (Signed)
Indwelling Urinary Catheter Care, Adult An indwelling urinary catheter is a thin tube that is put into your bladder. The tube helps to drain pee (urine) out of your body. The tube goes in through your urethra. Your urethra is where pee comes out of your body. Your pee will come out through the catheter, then it will go into a bag (drainage bag). Take good care of your catheter so it will work well. How to wear your catheter and bag Supplies needed  Sticky tape (adhesive tape) or a leg strap.  Alcohol wipe or soap and water (if you use tape).  A clean towel (if you use tape).  Large overnight bag.  Smaller bag (leg bag). Wearing your catheter Attach your catheter to your leg with tape or a leg strap.  Make sure the catheter is not pulled tight.  If a leg strap gets wet, take it off and put on a dry strap.  If you use tape to hold the bag on your leg: 1. Use an alcohol wipe or soap and water to wash your skin where the tape made it sticky before. 2. Use a clean towel to pat-dry that skin. 3. Use new tape to make the bag stay on your leg. Wearing your bags You should have been given a large overnight bag.  You may wear the overnight bag in the day or night.  Always have the overnight bag lower than your bladder.  Do not let the bag touch the floor.  Before you go to sleep, put a clean plastic bag in a wastebasket. Then hang the overnight bag inside the wastebasket. You should also have a smaller leg bag that fits under your clothes.  Always wear the leg bag below your knee.  Do not wear your leg bag at night. How to care for your skin and catheter Supplies needed  A clean washcloth.  Water and mild soap.  A clean towel. Caring for your skin and catheter      Clean the skin around your catheter every day: 1. Wash your hands with soap and water. 2. Wet a clean washcloth in warm water and mild soap. 3. Clean the skin around your urethra.  If you are  male:  Gently spread the folds of skin around your vagina (labia).  With the washcloth in your other hand, wipe the inner side of your labia on each side. Wipe from front to back.  If you are male:  Pull back any skin that covers the end of your penis (foreskin).  With the washcloth in your other hand, wipe your penis in small circles. Start wiping at the tip of your penis, then move away from the catheter.  Move the foreskin back in place, if needed. 4. With your free hand, hold the catheter close to where it goes into your body.  Keep holding the catheter during cleaning so it does not get pulled out. 5. With the washcloth in your other hand, clean the catheter.  Only wipe downward on the catheter.  Do not wipe upward toward your body. Doing this may push germs into your urethra and cause infection. 6. Use a clean towel to pat-dry the catheter and the skin around it. Make sure to wipe off all soap. 7. Wash your hands with soap and water.  Shower every day. Do not take baths.  Do not use cream, ointment, or lotion on the area where the catheter goes into your body, unless your doctor tells you   to.  Do not use powders, sprays, or lotions on your genital area.  Check your skin around the catheter every day for signs of infection. Check for: ? Redness, swelling, or pain. ? Fluid or blood. ? Warmth. ? Pus or a bad smell. How to empty the bag Supplies needed  Rubbing alcohol.  Gauze pad or cotton ball.  Tape or a leg strap. Emptying the bag Pour the pee out of your bag when it is ?- full, or at least 2-3 times a day. Do this for your overnight bag and your leg bag. 1. Wash your hands with soap and water. 2. Separate (detach) the bag from your leg. 3. Hold the bag over the toilet or a clean pail. Keep the bag lower than your hips and bladder. This is so the pee (urine) does not go back into the tube. 4. Open the pour spout. It is at the bottom of the bag. 5. Empty the  pee into the toilet or pail. Do not let the pour spout touch any surface. 6. Put rubbing alcohol on a gauze pad or cotton ball. 7. Use the gauze pad or cotton ball to clean the pour spout. 8. Close the pour spout. 9. Attach the bag to your leg with tape or a leg strap. 10. Wash your hands with soap and water. Follow instructions for cleaning the drainage bag:  From the product maker.  As told by your doctor. How to change the bag Supplies needed  Alcohol wipes.  A clean bag.  Tape or a leg strap. Changing the bag Replace your bag when it starts to leak, smell bad, or look dirty. 1. Wash your hands with soap and water. 2. Separate the dirty bag from your leg. 3. Pinch the catheter with your fingers so that pee does not spill out. 4. Separate the catheter tube from the bag tube where these tubes connect (at the connection valve). Do not let the tubes touch any surface. 5. Clean the end of the catheter tube with an alcohol wipe. Use a different alcohol wipe to clean the end of the bag tube. 6. Connect the catheter tube to the tube of the clean bag. 7. Attach the clean bag to your leg with tape or a leg strap. Do not make the bag tight on your leg. 8. Wash your hands with soap and water. General rules   Never pull on your catheter. Never try to take it out. Doing that can hurt you.  Always wash your hands before and after you touch your catheter or bag. Use a mild, fragrance-free soap. If you do not have soap and water, use hand sanitizer.  Always make sure there are no twists or bends (kinks) in the catheter tube.  Always make sure there are no leaks in the catheter or bag.  Drink enough fluid to keep your pee pale yellow.  Do not take baths, swim, or use a hot tub.  If you are male, wipe from front to back after you poop (have a bowel movement). Contact a doctor if:  Your pee is cloudy.  Your pee smells worse than usual.  Your catheter gets clogged.  Your catheter  leaks.  Your bladder feels full. Get help right away if:  You have redness, swelling, or pain where the catheter goes into your body.  You have fluid, blood, pus, or a bad smell coming from the area where the catheter goes into your body.  Your skin feels warm where   the catheter goes into your body.  You have a fever.  You have pain in your: ? Belly (abdomen). ? Legs. ? Lower back. ? Bladder.  You see blood in the catheter.  Your pee is pink or red.  You feel sick to your stomach (nauseous).  You throw up (vomit).  You have chills.  Your pee is not draining into the bag.  Your catheter gets pulled out. Summary  An indwelling urinary catheter is a thin tube that is placed into the bladder to help drain pee (urine) out of the body.  The catheter is placed into the part of the body that drains pee from the bladder (urethra).  Taking good care of your catheter will keep it working properly and help prevent problems.  Always wash your hands before and after touching your catheter or bag.  Never pull on your catheter or try to take it out. This information is not intended to replace advice given to you by your health care provider. Make sure you discuss any questions you have with your health care provider. Document Revised: 03/03/2019 Document Reviewed: 06/25/2017 Elsevier Patient Education  2020 [employer]   Post Anesthesia Home Care Instructions  Activity: Get plenty of rest for the remainder of the day. A responsible individual must stay with you for 24 hours following the procedure.  For the next 24 hours, DO NOT: -Drive a car -Operate machinery -Drink alcoholic beverages -Take any medication unless instructed by your physician -Make any legal decisions or sign important papers.  Meals: Start with liquid foods such as gelatin or soup. Progress to regular foods as tolerated. Avoid greasy, spicy, heavy foods. If nausea and/or vomiting occur, drink only  clear liquids until the nausea and/or vomiting subsides. Call your physician if vomiting continues.  Special Instructions/Symptoms: Your throat may feel dry or sore from the anesthesia or the breathing tube placed in your throat during surgery. If this causes discomfort, gargle with warm salt water. The discomfort should disappear within 24 hours.   

## 2019-12-08 NOTE — Anesthesia Procedure Notes (Signed)
Procedure Name: LMA Insertion Date/Time: 12/08/2019 7:51 AM Performed by: Bonney Aid, CRNA Pre-anesthesia Checklist: Patient identified, Emergency Drugs available, Suction available and Patient being monitored Patient Re-evaluated:Patient Re-evaluated prior to induction Oxygen Delivery Method: Circle system utilized Preoxygenation: Pre-oxygenation with 100% oxygen Induction Type: IV induction Ventilation: Mask ventilation without difficulty LMA: LMA inserted LMA Size: 5.0 Number of attempts: 1 Airway Equipment and Method: Bite block Placement Confirmation: positive ETCO2 Tube secured with: Tape Dental Injury: Teeth and Oropharynx as per pre-operative assessment

## 2019-12-11 LAB — SURGICAL PATHOLOGY

## 2019-12-27 ENCOUNTER — Ambulatory Visit: Payer: Self-pay | Admitting: Neurology

## 2019-12-28 DIAGNOSIS — G4737 Central sleep apnea in conditions classified elsewhere: Secondary | ICD-10-CM | POA: Diagnosis not present

## 2019-12-28 DIAGNOSIS — G4733 Obstructive sleep apnea (adult) (pediatric): Secondary | ICD-10-CM | POA: Diagnosis not present

## 2020-01-04 ENCOUNTER — Ambulatory Visit: Payer: Medicare Other | Admitting: Adult Health

## 2020-01-08 ENCOUNTER — Telehealth: Payer: Self-pay

## 2020-01-08 DIAGNOSIS — R310 Gross hematuria: Secondary | ICD-10-CM | POA: Diagnosis not present

## 2020-01-08 NOTE — Telephone Encounter (Signed)
Patient called and had concerns about his cpap mask causing the bridge of nose to hurt. I told him about the dreamware full face mask that sits under the nose and to call his DME company to let them know. He will call back if they cannot help.

## 2020-01-17 DIAGNOSIS — R310 Gross hematuria: Secondary | ICD-10-CM | POA: Diagnosis not present

## 2020-01-17 DIAGNOSIS — N3000 Acute cystitis without hematuria: Secondary | ICD-10-CM | POA: Diagnosis not present

## 2020-01-18 ENCOUNTER — Emergency Department (HOSPITAL_COMMUNITY)
Admission: EM | Admit: 2020-01-18 | Discharge: 2020-01-18 | Disposition: A | Discharge status: Home / Self Care | Payer: Medicare Other | Attending: Emergency Medicine | Admitting: Emergency Medicine

## 2020-01-18 ENCOUNTER — Other Ambulatory Visit: Payer: Self-pay

## 2020-01-18 DIAGNOSIS — Z7982 Long term (current) use of aspirin: Secondary | ICD-10-CM | POA: Diagnosis not present

## 2020-01-18 DIAGNOSIS — E039 Hypothyroidism, unspecified: Secondary | ICD-10-CM | POA: Insufficient documentation

## 2020-01-18 DIAGNOSIS — I129 Hypertensive chronic kidney disease with stage 1 through stage 4 chronic kidney disease, or unspecified chronic kidney disease: Secondary | ICD-10-CM | POA: Insufficient documentation

## 2020-01-18 DIAGNOSIS — Z79899 Other long term (current) drug therapy: Secondary | ICD-10-CM | POA: Insufficient documentation

## 2020-01-18 DIAGNOSIS — N183 Chronic kidney disease, stage 3 unspecified: Secondary | ICD-10-CM | POA: Diagnosis not present

## 2020-01-18 DIAGNOSIS — C679 Malignant neoplasm of bladder, unspecified: Secondary | ICD-10-CM | POA: Diagnosis not present

## 2020-01-18 DIAGNOSIS — R339 Retention of urine, unspecified: Secondary | ICD-10-CM | POA: Diagnosis not present

## 2020-01-18 DIAGNOSIS — R319 Hematuria, unspecified: Secondary | ICD-10-CM | POA: Diagnosis present

## 2020-01-18 DIAGNOSIS — I10 Essential (primary) hypertension: Secondary | ICD-10-CM | POA: Diagnosis not present

## 2020-01-18 DIAGNOSIS — R310 Gross hematuria: Secondary | ICD-10-CM | POA: Diagnosis not present

## 2020-01-18 DIAGNOSIS — R58 Hemorrhage, not elsewhere classified: Secondary | ICD-10-CM | POA: Diagnosis not present

## 2020-01-18 DIAGNOSIS — Z743 Need for continuous supervision: Secondary | ICD-10-CM | POA: Diagnosis not present

## 2020-01-18 LAB — BASIC METABOLIC PANEL
Anion gap: 10 (ref 5–15)
BUN: 25 mg/dL — ABNORMAL HIGH (ref 8–23)
CO2: 21 mmol/L — ABNORMAL LOW (ref 22–32)
Calcium: 9.1 mg/dL (ref 8.9–10.3)
Chloride: 108 mmol/L (ref 98–111)
Creatinine, Ser: 1.52 mg/dL — ABNORMAL HIGH (ref 0.61–1.24)
GFR calc Af Amer: 48 mL/min — ABNORMAL LOW (ref >60)
GFR calc non Af Amer: 41 mL/min — ABNORMAL LOW (ref >60)
Glucose, Bld: 133 mg/dL — ABNORMAL HIGH (ref 70–99)
Potassium: 4.3 mmol/L (ref 3.5–5.1)
Sodium: 139 mmol/L (ref 135–145)

## 2020-01-18 LAB — CBC WITH DIFFERENTIAL/PLATELET
Abs Immature Granulocytes: 0.02 10*3/uL (ref 0.00–0.07)
Basophils Absolute: 0.1 10*3/uL (ref 0.0–0.1)
Basophils Relative: 1 %
Eosinophils Absolute: 0.2 10*3/uL (ref 0.0–0.5)
Eosinophils Relative: 2 %
HCT: 43.2 % (ref 39.0–52.0)
Hemoglobin: 14 g/dL (ref 13.0–17.0)
Immature Granulocytes: 0 %
Lymphocytes Relative: 14 %
Lymphs Abs: 1.2 10*3/uL (ref 0.7–4.0)
MCH: 29.5 pg (ref 26.0–34.0)
MCHC: 32.4 g/dL (ref 30.0–36.0)
MCV: 91.1 fL (ref 80.0–100.0)
Monocytes Absolute: 0.6 10*3/uL (ref 0.1–1.0)
Monocytes Relative: 7 %
Neutro Abs: 6.4 10*3/uL (ref 1.7–7.7)
Neutrophils Relative %: 76 %
Platelets: 254 10*3/uL (ref 150–400)
RBC: 4.74 MIL/uL (ref 4.22–5.81)
RDW: 13.8 % (ref 11.5–15.5)
WBC: 8.5 10*3/uL (ref 4.0–10.5)
nRBC: 0 % (ref 0.0–0.2)

## 2020-01-18 LAB — URINALYSIS, ROUTINE W REFLEX MICROSCOPIC
Bilirubin Urine: NEGATIVE
Glucose, UA: NEGATIVE mg/dL
Ketones, ur: NEGATIVE mg/dL
Leukocytes,Ua: NEGATIVE
Nitrite: NEGATIVE
Protein, ur: NEGATIVE mg/dL
RBC / HPF: >50 RBC/hpf — ABNORMAL HIGH (ref 0–5)
Specific Gravity, Urine: 1.004 — ABNORMAL LOW (ref 1.005–1.030)
pH: 5 (ref 5.0–8.0)

## 2020-01-18 MED ORDER — SODIUM CHLORIDE 0.9 % IR SOLN
3000.0000 mL | Status: DC
  Administered 2020-01-18: 3000 mL

## 2020-01-18 NOTE — ED Notes (Signed)
ED does not carry 20 French irrigation catheters. This RN attempted to call materials, but was unable to reach them. Dr. Roxanne Mins made aware and approved using a 22 french instead.

## 2020-01-18 NOTE — ED Provider Notes (Signed)
[facility] DEPT Provider Note   CSN: [id number] Arrival date & time: 01/18/20  0153   History Chief Complaint  Patient presents with  . Hematuria    [name] [name] is a 86 y.o. male.  The history is provided by the patient.  Hematuria  He has history of hypertension, hyperlipidemia, bladder cancer and comes in because of hematuria and difficulty urinating.  He had transurethral resection of bladder and prostate on January 15, and has been having intermittent hematuria since then.  This evening, and he did pass a fairly large clot and now is unable to urinate.  He is not on any anticoagulants.  He just completed a course of Cefpodoxime.  He had seen his urologist earlier today and urine had been sent for culture and he was given a prescription for a new antibiotic.  He is scheduled to get BCG treatment once bleeding is controlled.  He denies fever or chills and denies nausea or vomiting.  He denies abdominal pain or flank pain.  Past Medical History:  Diagnosis Date  . Arthritis    lower back bulging disc, hips, knees, thumbs, shoulder  . Bladder cancer ([place]) UROLOGIST-  DR MCKENZIE   S/P TURBT 08-16-2017  . BPH (benign prostatic hyperplasia)   . ckd stage 3w   . Elevated PSA   . Frequency of urination   . GERD (gastroesophageal reflux disease)    on protonix  . Hematuria   . History of colon polyps   . History of dysphagia resolved  . History of esophageal dilatation    for dysplasia w/ pills  . HLD (hyperlipidemia)   . HOH (hard of hearing)    slightly hoh  . HTN (hypertension)   . Hypothyroidism   . Incontinence of urine    only wears protection if he's planning to be in public for an extended amt of time  . 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 Shoulder bursitis    undiagnosed. painful and  limited ROM on left  . Sleep apnea    uses cpap  . Weakness of extremity    left knee  . Wears glasses     Patient Active Problem List   Diagnosis Date Noted  . 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 Hypersomnia with sleep apnea 09/15/2019  . Cardiac arrhythmia due to premature depolarization 09/15/2019  . Excessive daytime sleepiness 09/15/2019  . CKD (chronic kidney disease) stage 3, GFR 30-59 ml/min 09/15/2019  . Malignant neoplasm of urinary bladder ([place]) 09/15/2019  . Gross hematuria 09/11/2017    Past Surgical History:  Procedure Laterality Date  . CYSTOSCOPY W/ URETERAL STENT PLACEMENT Bilateral 08/16/2017   Procedure: CYSTOSCOPY WITH RETROGRADE PYELOGRAM/URETERAL STENT PLACEMENT;  Surgeon: Cleon Gustin, MD;  Location: [facility];  Service: Urology;  Laterality: Bilateral;  . CYSTOSCOPY WITH FULGERATION N/A 12/08/2019   Procedure: CYSTOSCOPY WITH FULGERATION;  Surgeon: Cleon Gustin, MD;  Location: [facility];  Service: Urology;  Laterality: N/A;  . KNEE ARTHROSCOPY Left 06/10/2011  . PROSTATE BIOPSY  2000  . TRANSURETHRAL RESECTION OF BLADDER TUMOR N/A 08/16/2017   Procedure: TRANSURETHRAL RESECTION OF BLADDER TUMOR (TURBT);  Surgeon: Cleon Gustin, MD;  Location: [facility];  Service: Urology;  Laterality: N/A;  . TRANSURETHRAL RESECTION OF BLADDER TUMOR  12-09-2012   dr Alona Bene  [facility]   and TURP  . TRANSURETHRAL RESECTION OF BLADDER TUMOR N/A 09/20/2017   Procedure: TRANSURETHRAL RESECTION OF BLADDER TUMOR (TURBT);  Surgeon: Cleon Gustin, MD;  Location: [facility];  Service: Urology;  Laterality: N/A;  . TRANSURETHRAL RESECTION OF BLADDER TUMOR N/A 12/08/2019   Procedure: TRANSURETHRAL RESECTION OF BLADDER TUMOR;  Surgeon: Cleon Gustin, MD;  Location: [facility];  Service: Urology;  Laterality: N/A;  1 HR  . TRANSURETHRAL RESECTION OF PROSTATE   2011       Family History  Problem Relation Age of Onset  . Hypertension Mother   . Breast cancer Mother   . Other Mother        brain tumor  . Alzheimer's disease Mother   . Emphysema Father        smoker    Social History   Tobacco Use  . Smoking status: Never Smoker  . Smokeless tobacco: Never Used  Substance Use Topics  . Alcohol use: Yes    Alcohol/week: 1.0 - 2.0 standard drinks    Types: 1 - 2 Glasses of wine per week    Comment: occa wine  . Drug use: No    Home Medications Prior to Admission medications   Medication Sig Start Date End Date Taking? Authorizing Provider  aspirin 81 MG tablet Take 81 mg by mouth at bedtime.     [provider]  finasteride (PROSCAR) 5 MG tablet Take 1 tablet by mouth every evening.  10/30/15   [provider]  HYDROcodone-acetaminophen (NORCO) 5-325 MG tablet Take 1 tablet by mouth every 6 (six) hours as needed for moderate pain. 12/08/19   McKenzie, Candee Furbish, MD  irbesartan (AVAPRO) 300 MG tablet Take 1 tablet by mouth at bedtime.  12/25/15   [provider]  levothyroxine (SYNTHROID, LEVOTHROID) 50 MCG tablet Take 1 tablet by mouth daily before breakfast.  12/25/15   [provider]  pantoprazole (PROTONIX) 40 MG tablet Take 1 tablet by mouth at bedtime.  12/20/15   [provider]  rosuvastatin (CRESTOR) 5 MG tablet Take 5 mg by mouth at bedtime.    [provider]  silodosin (RAPAFLO) 8 MG CAPS capsule Take 8 mg by mouth at bedtime.  08/16/19   [provider]    Allergies    Ciprofloxacin and Cephalexin  Review of Systems   Review of Systems  Genitourinary: Positive for hematuria.  All other systems reviewed and are negative.   Physical Exam Updated Vital Signs BP (!) 166/81 (BP Location: Right Arm)   Pulse 88   Temp 97.9 F (36.6 C) (Oral)   Resp 16   SpO2 96%   Physical Exam Vitals and nursing note reviewed.   86 year old male, resting comfortably and  in no acute distress. Vital signs are significant for elevated systolic blood pressure. Oxygen saturation is 96%, which is normal. Head is normocephalic and atraumatic. PERRLA, EOMI. Oropharynx is clear. Neck is nontender and supple without adenopathy or JVD. Back is nontender and there is no CVA tenderness. Lungs are clear without rales, wheezes, or rhonchi. Chest is nontender. Heart has regular rate and rhythm without murmur. Abdomen is soft, flat, nontender without masses or hepatosplenomegaly and peristalsis is normoactive. Extremities have no cyanosis or edema, full range of motion is present. Skin is warm and dry without rash. Neurologic: Mental status is normal, cranial nerves are intact, there are no motor or sensory deficits.  ED Results / Procedures /  Treatments   Labs (all labs ordered are listed, but only abnormal results are displayed) Labs Reviewed  URINALYSIS, ROUTINE W REFLEX MICROSCOPIC  BASIC METABOLIC PANEL  CBC WITH DIFFERENTIAL/PLATELET    EKG None  Radiology No results found.  Procedures Procedures   Medications Ordered in ED Medications  sodium chloride irrigation 0.9 % 3,000 mL (has no administration in time range)    ED Course  I have reviewed the triage vital signs and the nursing notes.  Pertinent labs & imaging results that were available during my care of the patient were reviewed by me and considered in my medical decision making (see chart for details).  MDM Rules/Calculators/A&P Gross hematuria with clots and bladder outlet obstruction.  Will place Foley catheter and irrigate bladder.  Old records are reviewed confirming transurethral resection of bladder and prostate on January 15.  Foley catheter was placed and the bladder irrigated.  Irrigation was continued until was only like pink fluid draining out.  Patient was offered the option of going home with a catheter in her abdomen is decided to leave the catheter in.  He is instructed to  follow-up with his urologist as soon as possible, return if he is having any problems with his catheter.  Final Clinical Impression(s) / ED Diagnoses Final diagnoses:  Urinary retention  Gross hematuria    Rx / DC Orders ED Discharge Orders    None       Delora Fuel, MD 86/75/44 [phone]

## 2020-01-18 NOTE — ED Triage Notes (Signed)
Recent bladder surgery and prostate surgery for CA. Pt note clots in urine admits to headache denies other pain . PMD on call sent pt to ED for eval

## 2020-01-18 NOTE — ED Notes (Signed)
Pt given ice water to drink

## 2020-01-18 NOTE — Discharge Instructions (Signed)
Return if you are having any problems. 

## 2020-01-22 ENCOUNTER — Telehealth: Payer: Self-pay | Admitting: Neurology

## 2020-01-22 NOTE — Telephone Encounter (Signed)
Patient called and left a VM wanting to reschedule his apt. Advised the patient that he really needed to be seen by 3/4. Patient had recently had a surgery which he has a indwelling catheter placed and was nervous about coming in with that. Advised the patient we have the capability of a mychart VV. Informed him of that process and scheduled the apt that was scheduled for that. Patient was appreciative.

## 2020-01-24 ENCOUNTER — Encounter: Payer: Self-pay | Admitting: Neurology

## 2020-01-24 ENCOUNTER — Telehealth: Payer: Self-pay | Admitting: Neurology

## 2020-01-24 NOTE — Telephone Encounter (Signed)
Pt called asking how to connect to the provider for his VV. Pt was informed that his appt was not for today it was for tomorrow but it had been canceled during the automated call. Pt states that tomorrow would be the last day that is allotted by the insurance for his cpap. Pt would like to speak to the RN. Please advise.

## 2020-01-24 NOTE — Telephone Encounter (Signed)
Somehow the patient had accidentally cancelled his 3:30 apt for tomorrow. I advised I had a opening at 11:30 am and that we could get him on a VV at that time. Patient verbalized understanding.

## 2020-01-25 ENCOUNTER — Ambulatory Visit: Payer: Medicare Other

## 2020-01-25 ENCOUNTER — Telehealth (INDEPENDENT_AMBULATORY_CARE_PROVIDER_SITE_OTHER): Payer: Medicare Other | Admitting: Neurology

## 2020-01-25 ENCOUNTER — Telehealth: Payer: Self-pay | Admitting: Neurology

## 2020-01-25 ENCOUNTER — Encounter: Payer: Self-pay | Admitting: Neurology

## 2020-01-25 DIAGNOSIS — I491 Atrial premature depolarization: Secondary | ICD-10-CM

## 2020-01-25 DIAGNOSIS — G4739 Other sleep apnea: Secondary | ICD-10-CM

## 2020-01-25 DIAGNOSIS — C679 Malignant neoplasm of bladder, unspecified: Secondary | ICD-10-CM

## 2020-01-25 DIAGNOSIS — G4733 Obstructive sleep apnea (adult) (pediatric): Secondary | ICD-10-CM | POA: Diagnosis not present

## 2020-01-25 DIAGNOSIS — G4719 Other hypersomnia: Secondary | ICD-10-CM

## 2020-01-25 DIAGNOSIS — G4737 Central sleep apnea in conditions classified elsewhere: Secondary | ICD-10-CM | POA: Diagnosis not present

## 2020-01-25 DIAGNOSIS — N1831 Chronic kidney disease, stage 3a: Secondary | ICD-10-CM

## 2020-01-25 DIAGNOSIS — G4730 Sleep apnea, unspecified: Secondary | ICD-10-CM | POA: Insufficient documentation

## 2020-01-25 NOTE — Progress Notes (Addendum)
SLEEP MEDICINE CLINIC    Provider:  Larey Seat, MD  Primary Care Physician:  [name], [place] [address] [place] [address]     Referring Provider: [name], [place],  [place] [address]          Chief Complaint according to patient   Patient presents with:    . New Patient (Initial Visit). This 86 year old patient states that when he is sedentary he notices a fatigue that will come over him , so that he has to lay down. He states that this is new for him. never had a PSG. pt unsure if snores in sleep.        01-25-2020: Virtual visit and follow up on sleep study:  The patient attended this video conference from home, me as the attending physician from my office.  The patient gave consent to perform this video guided telehealth visit and his identity was assured with 2 identifiers.  I also have met the patient before and identified him by video screen. I have the pleasure to follow Mr. [name] [name] today in a video guidance visit for a duration of 20 minutes.    The patient presented for a baseline sleep study on 10 September 2019, he was originally referred by Dr. Ashby Dawes.  His previous visit with me was on 08-21-2019.  He had reported having spells that completely drained him of energy.  He endorsed the Epworth Sleepiness Scale at 12 points.  His sleep study revealed mild sleep apnea 25 obstructive apneas, 6 centrals and 7 mixed apneas were noted and only 3 additional hypopneas.  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  He was a moderately loud snorer.  He had severe periodic leg movements at night.  He was asked to return for a CPAP titration on 16 November.  The patient experienced increasing central apneas during the CPAP titration and the modality had to be changed to BiPAP.  He ended up with a BiPAP setting of 18/10 cmH2O was 4 cm  additional pressure support with an ASV machine finally and his AHI became 3.6/h.  He also changed masks from a nasal mask to a [employer] full face mask and medium size however by now with mask also has left pressure marks on the bridge of his nose and we discussed a change today as well.  He has been very compliant with his auto BiPAP with a 4 cm water pressure spread 93% of the time with an average usage time of 6 hours 30 minutes.  Settings as above between a maximum pressure of 18 and a minimum pressure of 10 and 4 cm breadth.  The AHI is 3.6, air leaks at the 95th percentile at 12 L/min.  The 2 days that he could not use his CPAP were 1 day when he had bladder surgery for bladder cancer and a second day on the 24th when he was in the emergency room due to a blockage of the urinary outflow.  The patient reports today that he has become accustomed to the BiPAP use today his blood pressure taken at home was 147/78 mmHg, 98% oxygen saturation awake, heart rate 78 bpm, temperature was 97.2 F.  These data were obtained by his own home based machines.   The patient has preserved facial symmetry, normal upper extremity and shoulder range of motion, normal  breath holding capacity. His voice was slightly hoarse.    He endorsed the Epworth sleepiness score at 7 out of 24 points.  I offered to exchange the mask for the patient at the next point in time when he has Medicare coverage and we discussed a [employer] or ResMed F 30 I model with a nasal pillow or nasal cradle covering the mouth.  This would relieve him of pressure on the bridge of the nose.  Overall he has been much more alert he is happy with the use of BiPAP, he is less excessively sleepy in daytime he may take a power nap of 30 minutes most days of the week he also has been able to fall asleep earlier and stay asleep longer reaching 6-1/2 hours an average. He doesn't snore on CPAP. Nocturia is still a problem, related to bladder cancer,  and he has a catheter now that should be replaced tomorrow.  He has not yet had feedback from his cardiologist.   Plan: continue current settings, and I will order a change to RESMED F 30 I  ( to be fitted ) in 90 days when he is due for new headgear anyway.   [name] Seat, MD      HISTORY OF PRESENT ILLNESS:  Consult related notes.  [name] [name] is a 86 y.o. year old  Caucasian male patient referred by Dr Ashby Dawes, MD.   Chief concern according to patient : " having spells that make me feel completely drained of energy".   I have the pleasure of seeing [name] [name] today, a right -handed Caucasian male with a possible sleep disorder. He  Has not been waling as much, and this only over the last 3 moth, he can't do the same amount of yard work, just for the last 3 month, he documented this on his smart app.   His PCP had initiated a work up with cardiology and US of the lower extremities. No abnormal findings.  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   Shoulder bursitis, Weakness of extremity, and Wears glasses.   Sleep relevant medical history: Nocturia only once, No parasomnia and noTonsillectomy, no cervical spine or head trauma.   Family medical /sleep history: no other family member with OSA, insomnia, no sleep walkers.    Social history:  Patient is a former [employer] from [place], [place], now  retired from Estate manager/land agent at age 27, and lives in a household alone. Family status is widowed , his wife of  35 years died 55 years ago, 3 adult children 2 in their 24 s, 5 grandchildren.  The patient used to work in shifts( Presenter, broadcasting,) after age 33.  Pets are not present. Tobacco use*- never.  ETOH use; wine or beer , not more than 1 a day.   Caffeine intake in form of Coffee( 2-3 cups in AM) Soda( none ) Tea ( none) or energy drinks. Regular exercise in form of walking, which changed 3 month ago. .   Hobbies : gardening.   Sleep habits are as follows: The patient's dinner time is between 5 PM. The patient goes to bed at  midnight or later and continues to sleep for 6-7 hours, wakes for one bathroom break, goes back to sleep. Bedroom is cool, quiet and dark. He kept a journal at bedtime.  The preferred sleep position is left side , with the support of 1 pillow. Dreams are reportedly rare  And sometimes vivid.   6 .05 AM is the usual rise time. The patient wakes up with an alarm.  He reports feeling refreshed or restored in AM, but has occasional symptoms such as dry mout, no morning headaches.  Naps are taken ifrequently, lasting from 30 to 60 minutes and are as refreshing than nocturnal sleep.  ) [name] [name].  Is a 86 year old Caucasian gentleman who looks younger than his numeric age.  He has always kept a high level of activity but over the last 3 months has noted that he could not keep up with his usual pace.  His activity level as reported by his smart phone has helped over the last 3 months in comparison to March April or May of the same year. He does not have any kind of focal neurologic deficits and his orthopedic problems are present for many years prior to this beginning exercise intolerance.  He is sleeping and easily fatigued as well.  Since he sleeps alone there is nobody witnessing his sleep but he feels that for the most part he gets enough sleep at night yet he is excessively daytime sleepy by his own assessment. My goal is to invite [name] [name] for an attended sleep study to see  if sleep apnea is present, there may be irregular heartbeats,.'s of low oxygen saturation but also can lead to fatigue without apnea being present.  He presented today with an elevated blood pressure but this is not typical for him.  He has occasionally ankle edema mostly in the left leg, but not present today. 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  An echocardiogram was quoted from 08/03/2019 but have not seen the results.  The patient was told that there was no concern.  I would like to add that the patient does not endorse any symptoms of depression on the geriatric depression score which he endorsed at only 4 out of 15 points.      Review of Systems: Out of a complete 14 system review, the patient complains of only the following symptoms, and all other reviewed systems are negative.:  Fatigue, sleepiness, snoring, Nocturia- fragmented sleep,    How likely are you to doze in the following situations: 0 = not likely, 1 = slight chance, 2 = moderate chance, 3 = high chance   Sitting and Reading?1 Watching Television? 1 Sitting inactive in a public place (theater or meeting)?0 As a passenger in a car for an hour without a break?3 Lying down in the afternoon when circumstances permit?2 Sitting and talking to someone?0 Sitting quietly after lunch without alcohol?1 In a car, while stopped for a few minutes in traffic?0   Total =  7/ 24 points   FSS endorsed at 20/ 63 points.   Social History   Socioeconomic History  . Marital status: Widowed    Spouse name: Not on file  . Number of children: 3  . Years of  education: Not on file  . Highest education level: Not on file  Occupational  History  . Occupation: retired  Tobacco Use  . Smoking status: Never Smoker  . Smokeless tobacco: Never Used  Substance and Sexual Activity  . Alcohol use: Yes    Alcohol/week: 1.0 - 2.0 standard drinks    Types: 1 - 2 Glasses of wine per week    Comment: occa wine  . Drug use: No  . Sexual activity: Not on file  Other Topics Concern  . Not on file  Social History Narrative  . Not on file   Social Determinants of Health   Financial Resource Strain:   . Difficulty of Paying Living Expenses: Not on file  Food Insecurity:   . Worried About Charity fundraiser in the Last Year: Not on file  . Ran Out of Food in the Last Year: Not on file  Transportation Needs:   . Lack of Transportation (Medical): Not on file  . Lack of Transportation (Non-Medical): Not on file  Physical Activity:   . Days of Exercise per Week: Not on file  . Minutes of Exercise per Session: Not on file  Stress:   . Feeling of Stress : Not on file  Social Connections:   . Frequency of Communication with Friends and Family: Not on file  . Frequency of Social Gatherings with Friends and Family: Not on file  . Attends Religious Services: Not on file  . Active Member of Clubs or Organizations: Not on file  . Attends Archivist Meetings: Not on file  . Marital Status: Not on file    Family History  Problem Relation Age of Onset  . Hypertension Mother   . Breast cancer Mother   . Other Mother        brain tumor  . Alzheimer's disease Mother   . Emphysema Father        smoker    Past Medical History:  Diagnosis Date  . Arthritis    lower back bulging disc, hips, knees, thumbs, shoulder  . Bladder cancer ([place]) UROLOGIST-  DR MCKENZIE   S/P TURBT 08-16-2017  . BPH (benign prostatic hyperplasia)   . ckd stage 3w   . Elevated PSA   . Frequency of urination   . GERD (gastroesophageal reflux disease)    on protonix  . Hematuria   . History of colon polyps   . History of dysphagia resolved    . History of esophageal dilatation    for dysplasia w/ pills  . HLD (hyperlipidemia)   . HOH (hard of hearing)    slightly hoh  . HTN (hypertension)   . Hypothyroidism   . Incontinence of urine    only wears protection if he's planning to be in public for an extended amt of time  . 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 Shoulder bursitis    undiagnosed. painful and limited ROM on left  . Sleep apnea    uses cpap  . Weakness of extremity    left knee  . Wears glasses     Past Surgical History:  Procedure Laterality Date  . CYSTOSCOPY W/ URETERAL STENT PLACEMENT Bilateral 08/16/2017   Procedure: CYSTOSCOPY WITH RETROGRADE PYELOGRAM/URETERAL STENT PLACEMENT;  Surgeon: Cleon Gustin, MD;  [facility];  Service: Urology;  Laterality: Bilateral;  .  CYSTOSCOPY WITH FULGERATION N/A 12/08/2019   Procedure: CYSTOSCOPY WITH FULGERATION;  Surgeon: Cleon Gustin, MD;  Location[facility];  Service: Urology;  Laterality: N/A;  . KNEE ARTHROSCOPY Left 06/10/2011  . PROSTATE BIOPSY  2000  . TRANSURETHRAL RESECTION OF BLADDER TUMOR N/A 08/16/2017   Procedure: TRANSURETHRAL RESECTION OF BLADDER TUMOR (TURBT);  Surgeon: Cleon Gustin, MD;  Location: [facility];  Service: Urology;  Laterality: N/A;  . TRANSURETHRAL RESECTION OF BLADDER TUMOR  12-09-2012   dr Alona Bene [facility]   and TURP  . TRANSURETHRAL RESECTION OF BLADDER TUMOR N/A 09/20/2017   Procedure: TRANSURETHRAL RESECTION OF BLADDER TUMOR (TURBT);  Surgeon: Cleon Gustin, MD;  Location: [facility];  Service: Urology;  Laterality: N/A;  . TRANSURETHRAL RESECTION OF BLADDER TUMOR N/A 12/08/2019   Procedure: TRANSURETHRAL RESECTION OF BLADDER TUMOR;  Surgeon: Cleon Gustin, MD;  Location: [facility];  Service: Urology;  Laterality: N/A;  1 HR  . TRANSURETHRAL RESECTION OF PROSTATE  2011     Current Outpatient Medications on File Prior to Visit  Medication Sig Dispense Refill  . aspirin 81 MG tablet Take 81 mg by mouth at bedtime.     . finasteride (PROSCAR) 5 MG tablet Take 5 mg by mouth every evening.     . irbesartan (AVAPRO) 300 MG tablet Take 300 mg by mouth at bedtime.     Marland Kitchen levothyroxine (SYNTHROID, LEVOTHROID) 50 MCG tablet Take 50 mcg by mouth daily before breakfast.     . pantoprazole (PROTONIX) 40 MG tablet Take 40 mg by mouth at bedtime.     . rosuvastatin (CRESTOR) 5 MG tablet Take 5 mg by mouth at bedtime.    . silodosin (RAPAFLO) 8 MG CAPS capsule Take 8 mg by mouth at bedtime.      No current facility-administered medications on file prior to visit.    Allergies  Allergen Reactions  . Ciprofloxacin     REACTION: diarrhea.  Occurred with 75m dose.  Has since taken 5069mdoses and has had no reaction/diarrhea.  . Cephalexin Rash    Rash on arms    Physical exam:  There were no vitals filed for this visit. There is no height or weight on file to calculate BMI.   Wt Readings from Last 3 Encounters:  01/18/20 200 lb (90.7 kg)  12/08/19 203 lb 1.6 oz (92.1 kg)  08/21/19 200 lb (90.7 kg)     Ht Readings from Last 3 Encounters:  01/18/20 5' 9" (1.753 m)  12/08/19 5' 9" (1.753 m)  08/21/19 5' 9" (1.753 m)     RV in 12  Month  Electronically signed by: CaLarey SeatMD 01/25/2020 12:01 PM  [facility] and [facility]ified by The [employer] Sleep Medicine and Diplomate of the [employer] Sleep Medicine. Board certified In Neurology through the [employer] RidgeFellow of the [employer] Neurology. Medical Director of [employer]

## 2020-01-29 ENCOUNTER — Ambulatory Visit: Payer: Medicare Other | Attending: Internal Medicine

## 2020-01-29 DIAGNOSIS — Z23 Encounter for immunization: Secondary | ICD-10-CM

## 2020-01-29 NOTE — Progress Notes (Signed)
   Covid-19 Vaccination Clinic  Name:  [name] [name]    MRN: [id number] DOB: 03-18-1932  01/29/2020  [name] [name] was observed post Covid-19 immunization for 15 minutes without incident. He was provided with Vaccine Information Sheet and instruction to access the V-Safe system.   [name] [name] was instructed to call 911 with any severe reactions post vaccine: Marland Kitchen Difficulty breathing  . Swelling of face and throat  . A fast heartbeat  . A bad rash all over body  . Dizziness and weakness   Immunizations Administered    Name Date Dose VIS Date Route   Pfizer COVID-19 Vaccine 01/29/2020 11:48 AM 0.3 mL 11/03/2019 Intramuscular   Manufacturer: [place]   Lot: [id number]   [place]: [address]-3

## 2020-01-30 DIAGNOSIS — G4737 Central sleep apnea in conditions classified elsewhere: Secondary | ICD-10-CM | POA: Diagnosis not present

## 2020-01-30 DIAGNOSIS — G4733 Obstructive sleep apnea (adult) (pediatric): Secondary | ICD-10-CM | POA: Diagnosis not present

## 2020-01-31 DIAGNOSIS — C678 Malignant neoplasm of overlapping sites of bladder: Secondary | ICD-10-CM | POA: Diagnosis not present

## 2020-01-31 DIAGNOSIS — Z5111 Encounter for antineoplastic chemotherapy: Secondary | ICD-10-CM | POA: Diagnosis not present

## 2020-02-07 DIAGNOSIS — Z5111 Encounter for antineoplastic chemotherapy: Secondary | ICD-10-CM | POA: Diagnosis not present

## 2020-02-07 DIAGNOSIS — C678 Malignant neoplasm of overlapping sites of bladder: Secondary | ICD-10-CM | POA: Diagnosis not present

## 2020-02-14 DIAGNOSIS — C678 Malignant neoplasm of overlapping sites of bladder: Secondary | ICD-10-CM | POA: Diagnosis not present

## 2020-02-14 DIAGNOSIS — Z5111 Encounter for antineoplastic chemotherapy: Secondary | ICD-10-CM | POA: Diagnosis not present

## 2020-02-21 DIAGNOSIS — Z5111 Encounter for antineoplastic chemotherapy: Secondary | ICD-10-CM | POA: Diagnosis not present

## 2020-02-21 DIAGNOSIS — C678 Malignant neoplasm of overlapping sites of bladder: Secondary | ICD-10-CM | POA: Diagnosis not present

## 2020-02-25 DIAGNOSIS — G4733 Obstructive sleep apnea (adult) (pediatric): Secondary | ICD-10-CM | POA: Diagnosis not present

## 2020-02-25 DIAGNOSIS — G4737 Central sleep apnea in conditions classified elsewhere: Secondary | ICD-10-CM | POA: Diagnosis not present

## 2020-02-28 DIAGNOSIS — Z5111 Encounter for antineoplastic chemotherapy: Secondary | ICD-10-CM | POA: Diagnosis not present

## 2020-02-28 DIAGNOSIS — C678 Malignant neoplasm of overlapping sites of bladder: Secondary | ICD-10-CM | POA: Diagnosis not present

## 2020-03-05 ENCOUNTER — Ambulatory Visit: Payer: Medicare Other | Attending: Internal Medicine

## 2020-03-05 DIAGNOSIS — Z23 Encounter for immunization: Secondary | ICD-10-CM

## 2020-03-05 NOTE — Progress Notes (Signed)
   Covid-19 Vaccination Clinic  Name:  [name] [name]    MRN: [id number] DOB: Jun 08, 1932  03/05/2020  [name] [name] was observed post Covid-19 immunization for 15 minutes without incident. He was provided with Vaccine Information Sheet and instruction to access the V-Safe system.   [name] [name] was instructed to call 911 with any severe reactions post vaccine: Marland Kitchen Difficulty breathing  . Swelling of face and throat  . A fast heartbeat  . A bad rash all over body  . Dizziness and weakness   Immunizations Administered    Name Date Dose VIS Date Route   Pfizer COVID-19 Vaccine 03/05/2020  8:17 AM 0.3 mL 11/03/2019 Intramuscular   Manufacturer: [place]   Lot: [id number]   [place]: [address]-7

## 2020-03-06 DIAGNOSIS — C678 Malignant neoplasm of overlapping sites of bladder: Secondary | ICD-10-CM | POA: Diagnosis not present

## 2020-03-08 ENCOUNTER — Emergency Department (HOSPITAL_COMMUNITY): Payer: Medicare Other

## 2020-03-08 ENCOUNTER — Encounter (HOSPITAL_COMMUNITY): Payer: Self-pay

## 2020-03-08 ENCOUNTER — Emergency Department (HOSPITAL_COMMUNITY)
Admission: EM | Admit: 2020-03-08 | Discharge: 2020-03-08 | Disposition: A | Discharge status: Home / Self Care | Payer: Medicare Other | Attending: Emergency Medicine | Admitting: Emergency Medicine

## 2020-03-08 DIAGNOSIS — E039 Hypothyroidism, unspecified: Secondary | ICD-10-CM | POA: Diagnosis not present

## 2020-03-08 DIAGNOSIS — W109XXA Fall (on) (from) unspecified stairs and steps, initial encounter: Secondary | ICD-10-CM | POA: Insufficient documentation

## 2020-03-08 DIAGNOSIS — S060X0A Concussion without loss of consciousness, initial encounter: Secondary | ICD-10-CM | POA: Diagnosis not present

## 2020-03-08 DIAGNOSIS — W19XXXA Unspecified fall, initial encounter: Secondary | ICD-10-CM

## 2020-03-08 DIAGNOSIS — Z79899 Other long term (current) drug therapy: Secondary | ICD-10-CM | POA: Diagnosis not present

## 2020-03-08 DIAGNOSIS — Y9389 Activity, other specified: Secondary | ICD-10-CM | POA: Insufficient documentation

## 2020-03-08 DIAGNOSIS — I10 Essential (primary) hypertension: Secondary | ICD-10-CM | POA: Diagnosis not present

## 2020-03-08 DIAGNOSIS — N183 Chronic kidney disease, stage 3 unspecified: Secondary | ICD-10-CM | POA: Diagnosis not present

## 2020-03-08 DIAGNOSIS — Z7982 Long term (current) use of aspirin: Secondary | ICD-10-CM | POA: Insufficient documentation

## 2020-03-08 DIAGNOSIS — Y999 Unspecified external cause status: Secondary | ICD-10-CM | POA: Diagnosis not present

## 2020-03-08 DIAGNOSIS — Y929 Unspecified place or not applicable: Secondary | ICD-10-CM | POA: Insufficient documentation

## 2020-03-08 DIAGNOSIS — Z8551 Personal history of malignant neoplasm of bladder: Secondary | ICD-10-CM | POA: Diagnosis not present

## 2020-03-08 DIAGNOSIS — R42 Dizziness and giddiness: Secondary | ICD-10-CM | POA: Diagnosis not present

## 2020-03-08 DIAGNOSIS — S0990XA Unspecified injury of head, initial encounter: Secondary | ICD-10-CM | POA: Insufficient documentation

## 2020-03-08 DIAGNOSIS — Z743 Need for continuous supervision: Secondary | ICD-10-CM | POA: Diagnosis not present

## 2020-03-08 DIAGNOSIS — S59902A Unspecified injury of left elbow, initial encounter: Secondary | ICD-10-CM | POA: Diagnosis not present

## 2020-03-08 DIAGNOSIS — I129 Hypertensive chronic kidney disease with stage 1 through stage 4 chronic kidney disease, or unspecified chronic kidney disease: Secondary | ICD-10-CM | POA: Diagnosis not present

## 2020-03-08 DIAGNOSIS — M25522 Pain in left elbow: Secondary | ICD-10-CM | POA: Insufficient documentation

## 2020-03-08 NOTE — Discharge Instructions (Signed)
We signed the ER after he had a fall.  Likely the CT scan of your brain and the x-ray of your elbow did not reveal any evidence of concerning injuries. You might have mild concussion therefore we recommend that you ambulate with precautions.

## 2020-03-08 NOTE — ED Provider Notes (Signed)
[facility] EMERGENCY DEPARTMENT Provider Note   CSN: [id number] Arrival date & time: 03/08/20  2017     History Chief Complaint  Patient presents with  . Fall    [name] [name] is a 86 y.o. male.  HPI    86 year old male comes in a chief complaint of fall. Patient has no significant medical history.  He reports that he lost his balance while trying to save falling dumpster and he fell backwards.  He fell onto the ground from his first or second stair.  Patient hit the back of his head to the ground and is having some headache along with feeling woozy.  He could not quite remember how to use his cell phone initially.  Patient was able to ambulate, clean himself and then he decided to call EMS himself.  Additionally patient is complaining of some discomfort around his left elbow.  Past Medical History:  Diagnosis Date  . Arthritis    lower back bulging disc, hips, knees, thumbs, shoulder  . Bladder cancer ([place]) UROLOGIST-  DR MCKENZIE   S/P TURBT 08-16-2017  . BPH (benign prostatic hyperplasia)   . ckd stage 3w   . Elevated PSA   . Frequency of urination   . GERD (gastroesophageal reflux disease)    on protonix  . Hematuria   . History of colon polyps   . History of dysphagia resolved  . History of esophageal dilatation    for dysplasia w/ pills  . HLD (hyperlipidemia)   . HOH (hard of hearing)    slightly hoh  . HTN (hypertension)   . Hypothyroidism   . Incontinence of urine    only wears protection if he's planning to be in public for an extended amt of time  . 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 Shoulder bursitis    undiagnosed. painful and limited ROM on left  . Sleep apnea    uses cpap  . Weakness of extremity    left knee  . Wears glasses     Patient Active Problem List   Diagnosis Date Noted   . Sleep apnea treated with nocturnal BiPAP 01/25/2020  . 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 Hypersomnia with sleep apnea 09/15/2019  . Cardiac arrhythmia due to premature depolarization 09/15/2019  . Excessive daytime sleepiness 09/15/2019  . CKD (chronic kidney disease) stage 3, GFR 30-59 ml/min 09/15/2019  . Malignant neoplasm of urinary bladder ([place]) 09/15/2019  . Gross hematuria 09/11/2017    Past Surgical History:  Procedure Laterality Date  . CYSTOSCOPY W/ URETERAL STENT PLACEMENT Bilateral 08/16/2017   Procedure: CYSTOSCOPY WITH RETROGRADE PYELOGRAM/URETERAL STENT PLACEMENT;  Surgeon: Cleon Gustin, MD;  Location: [facility];  Service: Urology;  Laterality: Bilateral;  . CYSTOSCOPY WITH FULGERATION N/A 12/08/2019   Procedure: CYSTOSCOPY WITH FULGERATION;  Surgeon: Cleon Gustin, MD;  Location[facility];  Service: Urology;  Laterality: N/A;  . KNEE ARTHROSCOPY Left 06/10/2011  . PROSTATE BIOPSY  2000  . TRANSURETHRAL RESECTION OF BLADDER TUMOR N/A 08/16/2017   Procedure: TRANSURETHRAL RESECTION OF BLADDER TUMOR (TURBT);  Surgeon: Cleon Gustin, MD;  Location: [facility];  Service: Urology;  Laterality: N/A;  . TRANSURETHRAL RESECTION OF BLADDER TUMOR  12-09-2012   dr Alona Bene [facility]   and TURP  . TRANSURETHRAL RESECTION OF BLADDER TUMOR N/A  09/20/2017   Procedure: TRANSURETHRAL RESECTION OF BLADDER TUMOR (TURBT);  Surgeon: Cleon Gustin, MD;  Location: [facility];  Service: Urology;  Laterality: N/A;  . TRANSURETHRAL RESECTION OF BLADDER TUMOR N/A 12/08/2019   Procedure: TRANSURETHRAL RESECTION OF BLADDER TUMOR;  Surgeon: Cleon Gustin, MD;  Location: [facility];  Service: Urology;  Laterality: N/A;  1 HR  . TRANSURETHRAL RESECTION OF PROSTATE  2011       Family History  Problem Relation Age of Onset  . Hypertension Mother   . Breast cancer Mother    . Other Mother        brain tumor  . Alzheimer's disease Mother   . Emphysema Father        smoker    Social History   Tobacco Use  . Smoking status: Never Smoker  . Smokeless tobacco: Never Used  Substance Use Topics  . Alcohol use: Yes    Alcohol/week: 1.0 - 2.0 standard drinks    Types: 1 - 2 Glasses of wine per week    Comment: occa wine  . Drug use: No    Home Medications Prior to Admission medications   Medication Sig Start Date End Date Taking? Authorizing Provider  aspirin 81 MG tablet Take 81 mg by mouth at bedtime.     [provider]  finasteride (PROSCAR) 5 MG tablet Take 5 mg by mouth every evening.  10/30/15   [provider]  irbesartan (AVAPRO) 300 MG tablet Take 300 mg by mouth at bedtime.  12/25/15   [provider]  levothyroxine (SYNTHROID, LEVOTHROID) 50 MCG tablet Take 50 mcg by mouth daily before breakfast.  12/25/15   [provider]  pantoprazole (PROTONIX) 40 MG tablet Take 40 mg by mouth at bedtime.  12/20/15   [provider]  rosuvastatin (CRESTOR) 5 MG tablet Take 5 mg by mouth at bedtime.    [provider]  silodosin (RAPAFLO) 8 MG CAPS capsule Take 8 mg by mouth at bedtime.  08/16/19   [provider]    Allergies    Ciprofloxacin and Cephalexin  Review of Systems   Review of Systems  Constitutional: Positive for activity change.  Gastrointestinal: Negative for nausea and vomiting.  Musculoskeletal: Positive for arthralgias.  Skin: Positive for rash.  Allergic/Immunologic: Negative for immunocompromised state.  Neurological: Positive for headaches.  Hematological: Does not bruise/bleed easily.    Physical Exam Updated Vital Signs BP 120/81   Pulse 83   Temp 97.6 F (36.4 C)   Resp 16   Ht 5\' 9"  (1.753 m)   Wt 88.5 kg   SpO2 100%   BMI 28.80 kg/m   Physical Exam Vitals and nursing note reviewed.  Constitutional:      Appearance: He is well-developed.  HENT:      Head: Atraumatic.  Eyes:     Extraocular Movements: Extraocular movements intact.     Pupils: Pupils are equal, round, and reactive to light.  Cardiovascular:     Rate and Rhythm: Normal rate.  Pulmonary:     Effort: Pulmonary effort is normal.  Musculoskeletal:     Cervical back: Neck supple.     Comments: Patient has mild tenderness over the left elbow.  Range of motion of the elbow is intact  Head to toe evaluation shows no hematoma, bleeding of the scalp, no facial abrasions, no spine step offs, crepitus of the chest or neck, no tenderness to palpation of the bilateral upper and  lower extremities, no gross deformities, no chest tenderness, no pelvic pain.   Skin:    General: Skin is warm.  Neurological:     Mental Status: He is alert and oriented to person, place, and time.     ED Results / Procedures / Treatments   Labs (all labs ordered are listed, but only abnormal results are displayed) Labs Reviewed - No data to display  EKG None  Radiology DG Elbow Complete Left  Result Date: 03/08/2020 CLINICAL DATA:  Fall, elbow pain EXAM: LEFT ELBOW - COMPLETE 3+ VIEW COMPARISON:  None. FINDINGS: No acute bony abnormality. Specifically, no fracture, subluxation, or dislocation. No sizeable joint effusion. IV cannula noted in the antecubital fossa. No unexpected foreign body or soft tissue gas is seen. IMPRESSION: No acute bony abnormality. Electronically Signed   By: Lovena Le M.D.   On: 03/08/2020 21:15   CT Head Wo Contrast  Result Date: 03/08/2020 CLINICAL DATA:  Unwitnessed fall, no loss of consciousness EXAM: CT HEAD WITHOUT CONTRAST TECHNIQUE: Contiguous axial images were obtained from the base of the skull through the vertex without intravenous contrast. COMPARISON:  CT head 06/13/2011 FINDINGS: Brain: No evidence of acute infarction, hemorrhage, hydrocephalus, extra-axial collection or mass lesion/mass effect. Symmetric prominence of the ventricles, cisterns and sulci  compatible with parenchymal volume loss. Patchy areas of white matter hypoattenuation are most compatible with chronic microvascular angiopathy. Vascular: Atherosclerotic calcification of the carotid siphons and intradural vertebral arteries. No hyperdense vessel. Skull: No large scalp hematoma, soft tissue gas or foreign body. Benign punctate dermal calcifications are similar to prior. No calvarial fracture or suspicious osseous lesions. Sinuses/Orbits: Chronic opacification of the left sphenoid sinus with mild hyperostotic changes of the sinus periphery, unchanged from prior. Orbital structures are unremarkable aside from prior lens extractions. Other: None IMPRESSION: 1. No acute intracranial abnormality. No large scalp hematoma or calvarial fracture. 2. Mild parenchymal volume loss and chronic microvascular angiopathy. 3. Chronic left sphenoid sinus opacification, could reflect mucocele or chronic allergic fungal sinusitis. Electronically Signed   By: Lovena Le M.D.   On: 03/08/2020 21:05    Procedures Procedures (including critical care time)  Medications Ordered in ED Medications - No data to display  ED Course  I have reviewed the triage vital signs and the nursing notes.  Pertinent labs & imaging results that were available during my care of the patient were reviewed by me and considered in my medical decision making (see chart for details).    MDM Rules/Calculators/A&P                      86 year old comes in a chief complaint of fall.  He had a mechanical fall at his home and he struck his head.  He had some confusion and amnesia after the fall.  He is having posterior headache.  C-spine has been cleared clinically.  CT brain ordered and is negative for any acute findings.  Patient likely has concussion.  Additionally he has some abrasion over the elbow.  X-rays of the elbow looks reassuring.  He stable for discharge.  Final Clinical Impression(s) / ED Diagnoses Final diagnoses:    Fall, initial encounter  Concussion without loss of consciousness, initial encounter    Rx / DC Orders ED Discharge Orders    None       Varney Biles, MD 03/08/20 [phone]

## 2020-03-08 NOTE — ED Triage Notes (Signed)
Pt came in [facility] post unwitnessed approx 46ft fall from a step. Pt states he did not LOC and is complaining of head pain, but no pack or neck pain. Pt is not on blood thinners. 18GLAC

## 2020-03-08 NOTE — ED Notes (Signed)
Patient verbalizes understanding of discharge instructions. Opportunity for questioning and answers were provided. Armband removed by staff, pt discharged from ED. Pt. ambulatory and discharged home.  

## 2020-03-08 NOTE — ED Notes (Signed)
Pt did not arrive with a foley, therefore RN took documentation out of the chart

## 2020-03-26 DIAGNOSIS — G4737 Central sleep apnea in conditions classified elsewhere: Secondary | ICD-10-CM | POA: Diagnosis not present

## 2020-03-26 DIAGNOSIS — G4733 Obstructive sleep apnea (adult) (pediatric): Secondary | ICD-10-CM | POA: Diagnosis not present

## 2020-03-28 DIAGNOSIS — J323 Chronic sphenoidal sinusitis: Secondary | ICD-10-CM | POA: Diagnosis not present

## 2020-03-28 DIAGNOSIS — R519 Headache, unspecified: Secondary | ICD-10-CM | POA: Diagnosis not present

## 2020-03-28 DIAGNOSIS — R002 Palpitations: Secondary | ICD-10-CM | POA: Diagnosis not present

## 2020-03-28 DIAGNOSIS — R42 Dizziness and giddiness: Secondary | ICD-10-CM | POA: Diagnosis not present

## 2020-04-03 ENCOUNTER — Telehealth: Payer: Self-pay

## 2020-04-03 NOTE — Telephone Encounter (Signed)
NOTES ON FILE FROM [facility] [phone], SENT REFERRAL TO SCHEDULING 

## 2020-04-10 ENCOUNTER — Other Ambulatory Visit: Payer: Self-pay

## 2020-04-10 ENCOUNTER — Ambulatory Visit (INDEPENDENT_AMBULATORY_CARE_PROVIDER_SITE_OTHER): Payer: Medicare Other | Admitting: Cardiology

## 2020-04-10 ENCOUNTER — Encounter: Payer: Self-pay | Admitting: Cardiology

## 2020-04-10 VITALS — BP 150/79 | HR 84 | Ht 69.0 in | Wt 199.0 lb

## 2020-04-10 DIAGNOSIS — R0602 Shortness of breath: Secondary | ICD-10-CM | POA: Diagnosis not present

## 2020-04-10 DIAGNOSIS — I10 Essential (primary) hypertension: Secondary | ICD-10-CM

## 2020-04-10 DIAGNOSIS — R5382 Chronic fatigue, unspecified: Secondary | ICD-10-CM

## 2020-04-10 DIAGNOSIS — R42 Dizziness and giddiness: Secondary | ICD-10-CM | POA: Diagnosis not present

## 2020-04-10 DIAGNOSIS — R002 Palpitations: Secondary | ICD-10-CM | POA: Diagnosis not present

## 2020-04-10 DIAGNOSIS — Z7189 Other specified counseling: Secondary | ICD-10-CM

## 2020-04-10 NOTE — Progress Notes (Signed)
Cardiology Office Note:    Date:  04/10/2020   ID:  [name] [name], DOB Apr 07, 1932, MRN [id number]  PCP:  [name] Seashore, MD  Cardiologist:  [name] Dresser, MD  Referring MD: [name] Seashore, MD   CC: new patient evaluation for lightheadedness and palpitations  History of Present Illness:    [name] [name] is a 86 y.o. male with a hx of sleep apnea on BiPAP, bladder cancer who is seen as a new consult at the request of [name] Seashore, MD for the evaluation and management of lightheadedness and palpitations.  I unfortunately do not have any records from [name]. Mathis [name] office today, requested from [facility] office. Full history obtained by patient.  Brings a list of concerns with him today. Notes bouts of midday fatigue and other symptoms, including sense of anxiety, lightheadedness, throbbing in his forehead, shortness of breath. No chest pain, no headaches. Happening more frequently, nearly daily. Almost always in the afternoon. Had one episode that was so bad that he called 911.  Last night was shopping at the store for about an hour. No chest pain, no shortness of breath, but just felt very fatigued afterward.  -Initial onset:  -Frequency/Duration: likely about a year, but increased frequency recently. -Associated symptoms: see above -Aggravating/alleviating factors: no clear triggers, better if he takes a nap -Syncope/near syncope: none -Prior cardiac history: none that he knows of -Prior ECG:  NSR -Prior workup: has had several stress tests (distant), never told they were abnormal. -Prior treatment: none -Possible medication interactions: -Caffeine: 3 cups of coffee/day -Alcohol: almost none -Tobacco: never -OTC supplements: none -Comorbidities: sleep apnea, bladder cancer -Exercise level: had been walking 2-3 miles/day until recently, now not walking at all.  -Labs: TSH, kidney function/electrolytes, CBC reviewed. -Cardiac ROS:  no chest pain, no shortness of breath, no PND, no orthopnea, no LE edema. -Family history: son just had "veins cleaned out" last week. Brother with history of heart disease. Mother and mat gma had heart issues as well.  Wore a heart monitor in October 2020 for two weeks, I cannot see the report. He doesn't think there was anything abnormal.  Past Medical History:  Diagnosis Date  . Arthritis    lower back bulging disc, hips, knees, thumbs, shoulder  . Bladder cancer ([place]) UROLOGIST-  [name] [name]   S/P TURBT 08-16-2017  . BPH (benign prostatic hyperplasia)   . ckd stage 3w   . Elevated PSA   . Frequency of urination   . GERD (gastroesophageal reflux disease)    on protonix  . Hematuria   . History of colon polyps   . History of dysphagia resolved  . History of esophageal dilatation    for dysplasia w/ pills  . HLD (hyperlipidemia)   . HOH (hard of hearing)    slightly hoh  . HTN (hypertension)   . Hypothyroidism   . Incontinence of urine    only wears protection if he's planning to be in public for an extended amt of time  . 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 Shoulder bursitis    undiagnosed. painful and limited ROM on left  . Sleep apnea    uses cpap  . Weakness of extremity    left knee  . Wears glasses     Past Surgical History:  Procedure Laterality Date  . [place]  Bilateral 08/16/2017   Procedure: CYSTOSCOPY WITH RETROGRADE PYELOGRAM/URETERAL STENT PLACEMENT;  Surgeon: [name] Gustin, MD;  Location: [facility];  Service: Urology;  Laterality: Bilateral;  . CYSTOSCOPY WITH FULGERATION N/A 12/08/2019   Procedure: CYSTOSCOPY WITH FULGERATION;  Surgeon: [name] Gustin, MD;  Location: [facility];  Service: Urology;  Laterality: N/A;  . KNEE ARTHROSCOPY Left  06/10/2011  . PROSTATE BIOPSY  2000  . TRANSURETHRAL RESECTION OF BLADDER TUMOR N/A 08/16/2017   Procedure: TRANSURETHRAL RESECTION OF BLADDER TUMOR (TURBT);  Surgeon: [name] Gustin, MD;  Location[facility];  Service: Urology;  Laterality: N/A;  . TRANSURETHRAL RESECTION OF BLADDER TUMOR  12-09-2012   [name] [name] [name] [facility]   and TURP  . TRANSURETHRAL RESECTION OF BLADDER TUMOR N/A 09/20/2017   Procedure: TRANSURETHRAL RESECTION OF BLADDER TUMOR (TURBT);  Surgeon: [name] Gustin, MD;  Location: [facility];  Service: Urology;  Laterality: N/A;  . TRANSURETHRAL RESECTION OF BLADDER TUMOR N/A 12/08/2019   Procedure: TRANSURETHRAL RESECTION OF BLADDER TUMOR;  Surgeon: [name] Gustin, MD;  Location: [facility];  Service: Urology;  Laterality: N/A;  1 HR  . TRANSURETHRAL RESECTION OF PROSTATE  2011    Current Medications: Current Outpatient Medications on File Prior to Visit  Medication Sig  . aspirin 81 MG tablet Take 81 mg by mouth at bedtime.   . finasteride (PROSCAR) 5 MG tablet Take 5 mg by mouth every evening.   . irbesartan (AVAPRO) 300 MG tablet Take 300 mg by mouth at bedtime.   Marland Kitchen levothyroxine (SYNTHROID, LEVOTHROID) 50 MCG tablet Take 50 mcg by mouth daily before breakfast.   . pantoprazole (PROTONIX) 40 MG tablet Take 40 mg by mouth at bedtime.   . rosuvastatin (CRESTOR) 5 MG tablet Take 5 mg by mouth at bedtime.  . silodosin (RAPAFLO) 8 MG CAPS capsule Take 8 mg by mouth at bedtime.    No current facility-administered medications on file prior to visit.     Allergies:   Ciprofloxacin and Cephalexin   Social History   Tobacco Use  . Smoking status: Never Smoker  . Smokeless tobacco: Never Used  Substance Use Topics  . Alcohol use: Yes    Alcohol/week: 1.0 - 2.0 standard drinks    Types: 1 - 2 Glasses of wine per week    Comment: occa wine  . Drug use: No    Family History: family history includes  Alzheimer's disease in his mother; Breast cancer in his mother; Emphysema in his father; Hypertension in his mother; Other in his mother.  ROS:   Please see the history of present illness.  Additional pertinent ROS: Constitutional: Negative for chills, fever, night sweats, unintentional weight loss  HENT: Negative for ear pain and hearing loss.   Eyes: Negative for loss of vision and eye pain.  Respiratory: Negative for cough, sputum, wheezing.   Cardiovascular: See HPI. Gastrointestinal: Negative for abdominal pain, melena, and hematochezia.  Genitourinary: Negative for dysuria and hematuria.  Musculoskeletal: Negative for falls and myalgias.  Skin: Negative for itching and rash.  Neurological: Negative for focal weakness, focal sensory changes and loss of consciousness.  Endo/Heme/Allergies: Does not bruise/bleed easily.     EKGs/Labs/Other Studies Reviewed:    The following studies were reviewed today: No prior  EKG:  EKG is personally reviewed.  The ekg ordered today demonstrates NSR 74 bpm  Recent Labs: 01/18/2020: BUN 25; Creatinine, Ser 1.52; Hemoglobin 14.0; Platelets 254; Potassium 4.3; Sodium  139  Recent Lipid Panel No results found for: CHOL, TRIG, HDL, CHOLHDL, VLDL, LDLCALC, LDLDIRECT  Physical Exam:    VS:  BP (!) 150/79   Pulse 84   Ht 5\' 9"  (1.753 m)   Wt 199 lb (90.3 kg)   SpO2 99%   BMI 29.39 kg/m     Wt Readings from Last 3 Encounters:  04/10/20 199 lb (90.3 kg)  03/08/20 195 lb (88.5 kg)  01/18/20 200 lb (90.7 kg)    GEN: Well nourished, well developed in no acute distress HEENT: Normal, moist mucous membranes NECK: No JVD CARDIAC: regular rhythm, normal S1 and S2, no rubs or gallops. No murmurs. VASCULAR: Radial and DP pulses 2+ bilaterally. No carotid bruits RESPIRATORY:  Clear to auscultation without rales, wheezing or rhonchi  ABDOMEN: Soft, non-tender, non-distended MUSCULOSKELETAL:  Ambulates independently SKIN: Warm and dry, no  edema NEUROLOGIC:  Alert and oriented x 3. No focal neuro deficits noted. PSYCHIATRIC:  Normal affect    ASSESSMENT:    1. Chronic fatigue   2. Episodic lightheadedness   3. Heart palpitations   4. Shortness of breath   5. Cardiac risk counseling   6. Counseling on health promotion and disease prevention   7. Essential hypertension    PLAN:    Lightheadedness, palpitations, fatigue, shortness of breath Unclear etiology of his symptoms. Has family history of cardiovascular disease. Reports prior monitors unrevealing but I cannot see results. Discussed treadmill vs. Chemical stress test. He thinks he can walk on the treadmill and has done this in the past. Will start with echocardiogram, if normal then will proceed with exercise treadmill stress.  Hypertension: -continue irbesartan  Cardiac risk counseling and prevention recommendations: -recommend heart healthy/Mediterranean diet, with whole grains, fruits, vegetable, fish, lean meats, nuts, and olive oil. Limit salt. -recommend moderate walking, 3-5 times/week for 30-50 minutes each session. Aim for at least 150 minutes.week. Goal should be pace of 3 miles/hours, or walking 1.5 miles in 30 minutes -recommend avoidance of tobacco products. Avoid excess alcohol.  -ASCVD risk score: The ASCVD Risk score Mikey Bussing DC Jr., et al., 2013) failed to calculate for the following reasons:   The 2013 ASCVD risk score is only valid for ages 75 to 66   -on aspirin, rosuvastatin for prevention. Ok to continue for now.  Plan for follow up: TBD based on results of testing  [name] Dresser, MD, PhD [place]  [facility]    Medication Adjustments/Labs and Tests Ordered: Current medicines are reviewed at length with the patient today.  Concerns regarding medicines are outlined above.  Orders Placed This Encounter  Procedures  . EKG 12-Lead  . ECHOCARDIOGRAM COMPLETE   No orders of the defined types were placed in this  encounter.   Patient Instructions  Medication Instructions:  Your Physician recommend you continue on your current medication as directed.    *If you need a refill on your cardiac medications before your next appointment, please call your pharmacy*   Lab Work: None   Testing/Procedures: Your physician has requested that you have an echocardiogram. Echocardiography is a painless test that uses sound waves to create images of your heart. It provides your doctor with information about the size and shape of your heart and how well your heart's chambers and valves are working. This procedure takes approximately one hour. There are no restrictions for this procedure. [place] [address]    Follow-Up: At [facility], you and your health needs are our priority.  As part of our continuing mission to provide you with exceptional heart care, we have created designated Provider Care Teams.  These Care Teams include your primary Cardiologist (physician) and Advanced Practice Providers (APPs -  Physician Assistants and Nurse Practitioners) who all work together to provide you with the care you need, when you need it.  We recommend signing up for the patient portal called "MyChart".  Sign up information is provided on this After Visit Summary.  MyChart is used to connect with patients for Virtual Visits (Telemedicine).  Patients are able to view lab/test results, encounter notes, upcoming appointments, etc.  Non-urgent messages can be sent to your provider as well.   To learn more about what you can do with MyChart, go to [email or web address].    Your next appointment:   Based on test results  The format for your next appointment:   Either In Person or Virtual  Provider:   Buford Dresser, MD       Signed, [name] Dresser, MD PhD 04/10/2020    [place]

## 2020-04-10 NOTE — Patient Instructions (Signed)
Medication Instructions:  Your Physician recommend you continue on your current medication as directed.    *If you need a refill on your cardiac medications before your next appointment, please call your pharmacy*   Lab Work: None   Testing/Procedures: Your physician has requested that you have an echocardiogram. Echocardiography is a painless test that uses sound waves to create images of your heart. It provides your doctor with information about the size and shape of your heart and how well your heart's chambers and valves are working. This procedure takes approximately one hour. There are no restrictions for this procedure. [place] [address]    Follow-Up: At [facility], you and your health needs are our priority.  As part of our continuing mission to provide you with exceptional heart care, we have created designated Provider Care Teams.  These Care Teams include your primary Cardiologist (physician) and Advanced Practice Providers (APPs -  Physician Assistants and Nurse Practitioners) who all work together to provide you with the care you need, when you need it.  We recommend signing up for the patient portal called "MyChart".  Sign up information is provided on this After Visit Summary.  MyChart is used to connect with patients for Virtual Visits (Telemedicine).  Patients are able to view lab/test results, encounter notes, upcoming appointments, etc.  Non-urgent messages can be sent to your provider as well.   To learn more about what you can do with MyChart, go to [email or web address].    Your next appointment:   Based on test results  The format for your next appointment:   Either In Person or Virtual  Provider:   Buford Dresser, MD

## 2020-04-26 DIAGNOSIS — G4733 Obstructive sleep apnea (adult) (pediatric): Secondary | ICD-10-CM | POA: Diagnosis not present

## 2020-04-26 DIAGNOSIS — G4737 Central sleep apnea in conditions classified elsewhere: Secondary | ICD-10-CM | POA: Diagnosis not present

## 2020-05-03 ENCOUNTER — Ambulatory Visit (HOSPITAL_COMMUNITY): Payer: Medicare Other | Attending: Cardiology

## 2020-05-03 ENCOUNTER — Other Ambulatory Visit: Payer: Self-pay

## 2020-05-03 DIAGNOSIS — E785 Hyperlipidemia, unspecified: Secondary | ICD-10-CM | POA: Insufficient documentation

## 2020-05-03 DIAGNOSIS — I77810 Thoracic aortic ectasia: Secondary | ICD-10-CM | POA: Insufficient documentation

## 2020-05-03 DIAGNOSIS — R5382 Chronic fatigue, unspecified: Secondary | ICD-10-CM | POA: Diagnosis not present

## 2020-05-03 DIAGNOSIS — E039 Hypothyroidism, unspecified: Secondary | ICD-10-CM | POA: Insufficient documentation

## 2020-05-03 DIAGNOSIS — N189 Chronic kidney disease, unspecified: Secondary | ICD-10-CM | POA: Insufficient documentation

## 2020-05-03 DIAGNOSIS — R42 Dizziness and giddiness: Secondary | ICD-10-CM | POA: Diagnosis not present

## 2020-05-03 DIAGNOSIS — G4730 Sleep apnea, unspecified: Secondary | ICD-10-CM | POA: Insufficient documentation

## 2020-05-03 DIAGNOSIS — I351 Nonrheumatic aortic (valve) insufficiency: Secondary | ICD-10-CM | POA: Insufficient documentation

## 2020-05-03 DIAGNOSIS — I1310 Hypertensive heart and chronic kidney disease without heart failure, with stage 1 through stage 4 chronic kidney disease, or unspecified chronic kidney disease: Secondary | ICD-10-CM | POA: Insufficient documentation

## 2020-05-14 DIAGNOSIS — I129 Hypertensive chronic kidney disease with stage 1 through stage 4 chronic kidney disease, or unspecified chronic kidney disease: Secondary | ICD-10-CM | POA: Diagnosis not present

## 2020-05-14 DIAGNOSIS — N390 Urinary tract infection, site not specified: Secondary | ICD-10-CM | POA: Diagnosis not present

## 2020-05-14 DIAGNOSIS — Z79899 Other long term (current) drug therapy: Secondary | ICD-10-CM | POA: Diagnosis not present

## 2020-05-14 DIAGNOSIS — E039 Hypothyroidism, unspecified: Secondary | ICD-10-CM | POA: Diagnosis not present

## 2020-05-14 DIAGNOSIS — I10 Essential (primary) hypertension: Secondary | ICD-10-CM | POA: Diagnosis not present

## 2020-05-14 DIAGNOSIS — Z0000 Encounter for general adult medical examination without abnormal findings: Secondary | ICD-10-CM | POA: Diagnosis not present

## 2020-05-14 DIAGNOSIS — I700 Atherosclerosis of aorta: Secondary | ICD-10-CM | POA: Diagnosis not present

## 2020-05-14 DIAGNOSIS — E782 Mixed hyperlipidemia: Secondary | ICD-10-CM | POA: Diagnosis not present

## 2020-05-14 DIAGNOSIS — R7309 Other abnormal glucose: Secondary | ICD-10-CM | POA: Diagnosis not present

## 2020-05-15 ENCOUNTER — Telehealth: Payer: Self-pay | Admitting: Cardiology

## 2020-05-15 DIAGNOSIS — G4730 Sleep apnea, unspecified: Secondary | ICD-10-CM

## 2020-05-15 DIAGNOSIS — I4940 Unspecified premature depolarization: Secondary | ICD-10-CM

## 2020-05-15 DIAGNOSIS — R5382 Chronic fatigue, unspecified: Secondary | ICD-10-CM

## 2020-05-15 DIAGNOSIS — G4733 Obstructive sleep apnea (adult) (pediatric): Secondary | ICD-10-CM | POA: Diagnosis not present

## 2020-05-15 DIAGNOSIS — G4737 Central sleep apnea in conditions classified elsewhere: Secondary | ICD-10-CM | POA: Diagnosis not present

## 2020-05-15 NOTE — Telephone Encounter (Signed)
Patient is returning call to discuss results from echocardiogram completed on 05/03/20. Please call.

## 2020-05-15 NOTE — Telephone Encounter (Signed)
[name] Dresser, MD  05/06/2020 3:40 PM EDT     Echo with normal squeeze, normal relaxation, no significant valve issues. Nothing that suggests a cardiac cause of fatigue. Recommend proceeding with exercise treadmill stress test (treadmill only, no images) to evaluate for ECG changes and exercise tolerance.    information given to patient  Patient aware will have  scheduler   - schedule Exercise tolerance test  patient is aware schedule will call to arrange

## 2020-05-16 DIAGNOSIS — C678 Malignant neoplasm of overlapping sites of bladder: Secondary | ICD-10-CM | POA: Diagnosis not present

## 2020-05-22 ENCOUNTER — Encounter: Payer: Self-pay | Admitting: Cardiology

## 2020-05-22 ENCOUNTER — Telehealth (HOSPITAL_COMMUNITY): Payer: Self-pay

## 2020-05-22 NOTE — Telephone Encounter (Signed)
error 

## 2020-05-22 NOTE — Telephone Encounter (Signed)
Patient is returning call, states he is unsure what it is regarding.

## 2020-05-22 NOTE — Telephone Encounter (Signed)
Encounter complete. 

## 2020-05-23 ENCOUNTER — Encounter: Payer: Self-pay | Admitting: Cardiology

## 2020-05-23 DIAGNOSIS — N1832 Chronic kidney disease, stage 3b: Secondary | ICD-10-CM | POA: Diagnosis not present

## 2020-05-23 DIAGNOSIS — Z0000 Encounter for general adult medical examination without abnormal findings: Secondary | ICD-10-CM | POA: Diagnosis not present

## 2020-05-23 DIAGNOSIS — I10 Essential (primary) hypertension: Secondary | ICD-10-CM | POA: Insufficient documentation

## 2020-05-23 DIAGNOSIS — Z01818 Encounter for other preprocedural examination: Secondary | ICD-10-CM | POA: Diagnosis not present

## 2020-05-24 ENCOUNTER — Ambulatory Visit (HOSPITAL_COMMUNITY)
Admission: RE | Admit: 2020-05-24 | Discharge: 2020-05-24 | Disposition: A | Discharge status: Home / Self Care | Payer: Medicare Other | Source: Ambulatory Visit | Attending: Cardiology | Admitting: Cardiology

## 2020-05-24 ENCOUNTER — Other Ambulatory Visit: Payer: Self-pay

## 2020-05-24 DIAGNOSIS — R5382 Chronic fatigue, unspecified: Secondary | ICD-10-CM | POA: Diagnosis not present

## 2020-05-24 DIAGNOSIS — I4940 Unspecified premature depolarization: Secondary | ICD-10-CM | POA: Insufficient documentation

## 2020-05-24 DIAGNOSIS — G4730 Sleep apnea, unspecified: Secondary | ICD-10-CM | POA: Diagnosis not present

## 2020-05-24 LAB — EXERCISE TOLERANCE TEST
Estimated workload: 7 METS
Exercise duration (min): 6 min
Exercise duration (sec): 0 s
MPHR: 135 {beats}/min
Peak HR: 144 {beats}/min
Percent HR: 106 %
Rest HR: 71 {beats}/min

## 2020-05-26 DIAGNOSIS — G4733 Obstructive sleep apnea (adult) (pediatric): Secondary | ICD-10-CM | POA: Diagnosis not present

## 2020-05-26 DIAGNOSIS — G4737 Central sleep apnea in conditions classified elsewhere: Secondary | ICD-10-CM | POA: Diagnosis not present

## 2020-05-29 ENCOUNTER — Other Ambulatory Visit: Payer: Self-pay | Admitting: Urology

## 2020-05-29 ENCOUNTER — Telehealth: Payer: Self-pay | Admitting: Cardiology

## 2020-05-29 NOTE — Telephone Encounter (Signed)
Recent echocardiogram and ETT reassuring. Upcoming visit with Dr. Harrell Gave to help manage blood pressure. Callback pool to include preop clearance to the reason behind next office visit.

## 2020-05-29 NOTE — Telephone Encounter (Signed)
   [facility] Pre-operative Risk Assessment    HEARTCARE STAFF: - Please ensure there is not already an duplicate clearance open for this procedure. - Under Visit Info/Reason for Call, type in Other and utilize the format Clearance MM/DD/YY or Clearance TBD. Do not use dashes or single digits. - If request is for dental extraction, please clarify the # of teeth to be extracted.  Request for surgical clearance:  1. What type of surgery is being performed? Bladder biopsy  2. When is this surgery scheduled? 06/25/20  3. What type of clearance is required (medical clearance vs. Pharmacy clearance to hold med vs. Both)? both  4. Are there any medications that need to be held prior to surgery and how long? aspirin  5. Practice name and name of physician performing surgery? Dr. Claudia Desanctis at [facility]  6. What is the office phone number? [place]   7.   What is the office fax number? [phone]  8.   Anesthesia type (None, local, MAC, general) ? General    Leah Newnam 05/29/2020, 12:59 PM  _________________________________________________________________   (provider comments below)

## 2020-05-29 NOTE — Telephone Encounter (Signed)
Updated note section of patient's upcoming appointment to also include Pre-op clearance.

## 2020-05-30 NOTE — Telephone Encounter (Signed)
Will forward clearance notes to Dr. Harrell Gave for upcoming appt. I will send FYI to requesting office pt has appt with cardiologist for pre op assessment. I will remove from the pre op call back pool.

## 2020-06-13 ENCOUNTER — Ambulatory Visit: Payer: Medicare Other | Admitting: Cardiology

## 2020-06-13 ENCOUNTER — Other Ambulatory Visit: Payer: Self-pay

## 2020-06-13 ENCOUNTER — Encounter: Payer: Self-pay | Admitting: Cardiology

## 2020-06-13 VITALS — BP 140/80 | HR 83 | Ht 69.0 in | Wt 195.0 lb

## 2020-06-13 DIAGNOSIS — Z01810 Encounter for preprocedural cardiovascular examination: Secondary | ICD-10-CM | POA: Diagnosis not present

## 2020-06-13 DIAGNOSIS — Z7189 Other specified counseling: Secondary | ICD-10-CM | POA: Diagnosis not present

## 2020-06-13 DIAGNOSIS — I10 Essential (primary) hypertension: Secondary | ICD-10-CM | POA: Diagnosis not present

## 2020-06-13 DIAGNOSIS — R42 Dizziness and giddiness: Secondary | ICD-10-CM | POA: Diagnosis not present

## 2020-06-13 NOTE — Progress Notes (Signed)
Cardiology Office Note:    Date:  06/13/2020   ID:  [name] [name], DOB 1932-04-03, MRN [id number]  PCP:  [name], MD  Cardiologist:  Buford Dresser, MD  Referring MD: [name], MD   CC: follow up  History of Present Illness:    [name] [name] is a 86 y.o. male with a hx of sleep apnea on BiPAP, bladder cancer who is seen for follow up today. I initially met him 04/10/20  as a new consult at the request of [name], MD for the evaluation and management of lightheadedness and palpitations.  Today: Has upcoming cystoscopy scheduled, requested for preop clearance.  Planned surgery: cystoscopy 06/25/20 with Dr. Claudia Desanctis  Pertinent past cardiac history: no formal diagnosis, Has family history Prior cardiac workup: ETT, echo reassuring, see below. I do not have access to monitor results History of valve disease: none History of CAD/PAD/CVA/TIA: none History of heart failure: none History of arrhythmia: none On anticoagulation: none Additional history: has hypertension. Denies diabetes, anesthesia complications. Seeing nephrology soon for CKD at request of his PCP. Has OSA on PAP Current symptoms: initially seen for fatigue, shortness of breath, lightheadedness/palpitations. Functional capacity: climbs stairs multiple times/day. Walks routinely, without symptoms, up to several miles.  Is back to walking, walked last night over a mile, but still having intermittent spells of pressure in his head with shortness of breath. No pain with these episodes. Can be active through it, doesn't stop him. Also goes away if he takes a nap. Not daily, but often. More often in the afternoon/evening.  Brings blood pressure log today. Has BID numbers, overall very well controlled. Highest 153/83, average 120s/70s.  Noted symptomatic episode 06/11/20 at 10 PM. Blood pressure 94/54, HR 66. He was having the pressure in his head and shortness of breath at this time.    Drinks a lot of water. Aspirin on hold for cystoscopy.  Past Medical History:  Diagnosis Date  . Arthritis    lower back bulging disc, hips, knees, thumbs, shoulder  . Bladder cancer ([place]) UROLOGIST-  DR MCKENZIE   S/P TURBT 08-16-2017  . BPH (benign prostatic hyperplasia)   . ckd stage 3w   . Elevated PSA   . Frequency of urination   . GERD (gastroesophageal reflux disease)    on protonix  . Hematuria   . History of colon polyps   . History of dysphagia resolved  . History of esophageal dilatation    for dysplasia w/ pills  . HLD (hyperlipidemia)   . HOH (hard of hearing)    slightly hoh  . HTN (hypertension)   . Hypothyroidism   . Incontinence of urine    only wears protection if he's planning to be in public for an extended amt of time  . 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 Shoulder bursitis    undiagnosed. painful and limited ROM on left  . Sleep apnea    uses cpap  . Weakness of extremity    left knee  . Wears glasses     Past Surgical History:  Procedure Laterality Date  . CYSTOSCOPY W/ URETERAL STENT PLACEMENT Bilateral 08/16/2017   Procedure: CYSTOSCOPY WITH RETROGRADE PYELOGRAM/URETERAL STENT PLACEMENT;  Surgeon: Cleon Gustin, MD;  Location[facility];  Service: Urology;  Laterality: Bilateral;  . CYSTOSCOPY WITH FULGERATION N/A 12/08/2019   Procedure: CYSTOSCOPY  WITH FULGERATION;  Surgeon: Cleon Gustin, MD;  Location[facility];  Service: Urology;  Laterality: N/A;  . KNEE ARTHROSCOPY Left 06/10/2011  . PROSTATE BIOPSY  2000  . TRANSURETHRAL RESECTION OF BLADDER TUMOR N/A 08/16/2017   Procedure: TRANSURETHRAL RESECTION OF BLADDER TUMOR (TURBT);  Surgeon: Cleon Gustin, MD;  Location: [facility];  Service: Urology;  Laterality: N/A;  . TRANSURETHRAL  RESECTION OF BLADDER TUMOR  12-09-2012   dr [facility]   and TURP  . TRANSURETHRAL RESECTION OF BLADDER TUMOR N/A 09/20/2017   Procedure: TRANSURETHRAL RESECTION OF BLADDER TUMOR (TURBT);  Surgeon: Cleon Gustin, MD;  Location: [facility];  Service: Urology;  Laterality: N/A;  . TRANSURETHRAL RESECTION OF BLADDER TUMOR N/A 12/08/2019   Procedure: TRANSURETHRAL RESECTION OF BLADDER TUMOR;  Surgeon: Cleon Gustin, MD;  Location: [facility];  Service: Urology;  Laterality: N/A;  1 HR  . TRANSURETHRAL RESECTION OF PROSTATE  2011    Current Medications: Current Outpatient Medications on File Prior to Visit  Medication Sig  . aspirin 81 MG tablet Take 81 mg by mouth at bedtime.   . finasteride (PROSCAR) 5 MG tablet Take 5 mg by mouth every evening.   . irbesartan (AVAPRO) 300 MG tablet Take 300 mg by mouth at bedtime.   Marland Kitchen levothyroxine (SYNTHROID, LEVOTHROID) 50 MCG tablet Take 50 mcg by mouth daily before breakfast.   . pantoprazole (PROTONIX) 40 MG tablet Take 40 mg by mouth at bedtime.   . rosuvastatin (CRESTOR) 5 MG tablet Take 5 mg by mouth at bedtime.  . silodosin (RAPAFLO) 8 MG CAPS capsule Take 8 mg by mouth at bedtime.    No current facility-administered medications on file prior to visit.     Allergies:   Ciprofloxacin and Cephalexin   Social History   Tobacco Use  . Smoking status: Never Smoker  . Smokeless tobacco: Never Used  Vaping Use  . Vaping Use: Never used  Substance Use Topics  . Alcohol use: Yes    Alcohol/week: 1.0 - 2.0 standard drink    Types: 1 - 2 Glasses of wine per week    Comment: occa wine  . Drug use: No    Family History: family history includes Alzheimer's disease in his mother; Breast cancer in his mother; Emphysema in his father; Hypertension in his mother; Other in his mother. son had "veins cleaned out" 03/2020. Brother with history of heart disease. Mother and mat gma had heart issues as  well.  ROS:   Please see the history of present illness.  Additional pertinent ROS otherwise unremarkable.   EKGs/Labs/Other Studies Reviewed:    The following studies were reviewed today: Echo 05/03/20 1. Left ventricular ejection fraction, by estimation, is 55 to 60%. The  left ventricle has normal function. The left ventricle has no regional  wall motion abnormalities. There is mild concentric left ventricular  hypertrophy. Left ventricular diastolic  parameters were normal.  2. Right ventricular systolic function is normal. The right ventricular  size is normal.  3. Left atrial size was mildly dilated.  4. The mitral valve is normal in structure. Trivial mitral valve  regurgitation. No evidence of mitral stenosis.  5. The aortic valve is tricuspid. Aortic valve regurgitation is mild.  Mild aortic valve sclerosis is present, with no evidence of aortic valve  stenosis.  6. There is mild dilatation of the ascending aorta measuring 39 mm.  7. The inferior vena cava is  normal in size with greater than 50%  respiratory variability, suggesting right atrial pressure of 3 mmHg.   ETT 05/24/20  Blood pressure demonstrated a hypertensive response to exercise.  Upsloping ST segment depression ST segment depression was noted during stress in the III, II, aVF, V6, V5 and V4 leads.  Negative, adequate stress test. Baseline Vitals  Rest HR 71 bpm    Rest BP 161/83 mmHg    Exercise Time  Exercise duration (min) 6 min    Exercise duration (sec) 0 sec    Peak Stress Vitals  Peak HR 144 bpm    Peak BP 217/89 mmHg    Exercise Data  MPHR 135 bpm    Percent HR 106 %    Estimated workload 7 METS     EKG:  EKG is personally reviewed.  The ekg ordered 04/10/20 demonstrates NSR 74 bpm  Recent Labs: 01/18/2020: BUN 25; Creatinine, Ser 1.52; Hemoglobin 14.0; Platelets 254; Potassium 4.3; Sodium 139  Recent Lipid Panel No results found for: CHOL, TRIG, HDL, CHOLHDL, VLDL,  LDLCALC, LDLDIRECT  Physical Exam:    VS:  BP (!) 140/80   Pulse 83   Ht '5\' 9"'$  (1.753 m)   Wt 195 lb (88.5 kg)   SpO2 98%   BMI 28.80 kg/m     Wt Readings from Last 3 Encounters:  06/13/20 195 lb (88.5 kg)  04/10/20 199 lb (90.3 kg)  03/08/20 195 lb (88.5 kg)    GEN: Well nourished, well developed in no acute distress HEENT: Normal, moist mucous membranes NECK: No JVD CARDIAC: regular rhythm, normal S1 and S2, no rubs or gallops. No murmur. VASCULAR: Radial and DP pulses 2+ bilaterally. No carotid bruits RESPIRATORY:  Clear to auscultation without rales, wheezing or rhonchi  ABDOMEN: Soft, non-tender, non-distended MUSCULOSKELETAL:  Ambulates independently SKIN: Warm and dry, no edema NEUROLOGIC:  Alert and oriented x 3. No focal neuro deficits noted. PSYCHIATRIC:  Normal affect   ASSESSMENT:    1. Preop cardiovascular exam   2. Episodic lightheadedness   3. Essential hypertension   4. Cardiac risk counseling   5. Counseling on health promotion and disease prevention    PLAN:    Preoperative cardiovascular examination: Based on available date, patient's RCRI score = 0, which carries a 3.9% 30-day risk of death, MI, or cardiac arrest. This is low risk.  The patient is not currently having active cardiac symptoms, and they can achieve >4 METs of activity.  According to ACC/AHA Guidelines, no further testing is needed.  Proceed with surgery at acceptable risk.  Our service is available as needed in the peri-operative period.   Lightheadedness, palpitations, fatigue, shortness of breath -echo, treadmill stress do not suggest cardiac cause -told monitor was normal, though I do not have records of this -most of his symptoms do not line up with changes in vital signs. One event however did line up with hypotension. -continue to monitor.   Hypertension: -continue irbesartan -overall good control, with some variability  Hyperlipidemia, unspecified: -Per [facility], last  lipids showed Tchol 134, HDL 37, LDL 82, TG 148 -continue rosuvastatin  Cardiac risk counseling and prevention recommendations: -recommend heart healthy/Mediterranean diet, with whole grains, fruits, vegetable, fish, lean meats, nuts, and olive oil. Limit salt. -recommend moderate walking, 3-5 times/week for 30-50 minutes each session. Aim for at least 150 minutes.week. Goal should be pace of 3 miles/hours, or walking 1.5 miles in 30 minutes -recommend avoidance of tobacco products. Avoid excess alcohol.  -ASCVD risk score: The  ASCVD Risk score Mikey Bussing DC Jr., et al., 2013) failed to calculate for the following reasons:   The 2013 ASCVD risk score is only valid for ages 89 to 51   -on aspirin for primary prevention, on hold for procedure. No issues with this. We discussed stopping aspirin given age, indication, but he would like to restart post procedure  Plan for follow up: one year or sooner as needed  Buford Dresser, MD, PhD [place]  [facility]    Medication Adjustments/Labs and Tests Ordered: Current medicines are reviewed at length with the patient today.  Concerns regarding medicines are outlined above.  No orders of the defined types were placed in this encounter.  No orders of the defined types were placed in this encounter.   Patient Instructions  Medication Instructions:  NO CHANGES *If you need a refill on your cardiac medications before your next appointment, please call your pharmacy*   Lab Work: NOT NEEDED   Testing/Procedures: NOT NEEDED   Follow-Up: At [facility] you and your health needs are our priority.  As part of our continuing mission to provide you with exceptional heart care, we have created designated Provider Care Teams.  These Care Teams include your primary Cardiologist (physician) and Advanced Practice Providers (APPs -  Physician Assistants and Nurse Practitioners) who all work together to provide you with the care you need, when  you need it.    Your next appointment:   12 month(s)  The format for your next appointment:   In Person  Provider:   Buford Dresser, MD      Signed, Buford Dresser, MD PhD 06/13/2020    [place]

## 2020-06-13 NOTE — Patient Instructions (Signed)
Medication Instructions:  NO CHANGES *If you need a refill on your cardiac medications before your next appointment, please call your pharmacy*   Lab Work: NOT NEEDED   Testing/Procedures: NOT NEEDED   Follow-Up: At [facility], you and your health needs are our priority.  As part of our continuing mission to provide you with exceptional heart care, we have created designated Provider Care Teams.  These Care Teams include your primary Cardiologist (physician) and Advanced Practice Providers (APPs -  Physician Assistants and Nurse Practitioners) who all work together to provide you with the care you need, when you need it.    Your next appointment:   12 month(s)  The format for your next appointment:   In Person  Provider:   Buford Dresser, MD

## 2020-06-14 DIAGNOSIS — R319 Hematuria, unspecified: Secondary | ICD-10-CM | POA: Diagnosis not present

## 2020-06-14 DIAGNOSIS — N1832 Chronic kidney disease, stage 3b: Secondary | ICD-10-CM | POA: Diagnosis not present

## 2020-06-14 DIAGNOSIS — I129 Hypertensive chronic kidney disease with stage 1 through stage 4 chronic kidney disease, or unspecified chronic kidney disease: Secondary | ICD-10-CM | POA: Diagnosis not present

## 2020-06-14 DIAGNOSIS — C679 Malignant neoplasm of bladder, unspecified: Secondary | ICD-10-CM | POA: Diagnosis not present

## 2020-06-19 ENCOUNTER — Other Ambulatory Visit: Payer: Self-pay

## 2020-06-19 ENCOUNTER — Encounter (HOSPITAL_BASED_OUTPATIENT_CLINIC_OR_DEPARTMENT_OTHER): Payer: Self-pay | Admitting: Urology

## 2020-06-19 NOTE — Progress Notes (Addendum)
ADDENDUM: sPOKE WITH jESSICA zANETTO PA, PT MEETS [facility] GUID[employer]INES.  Spoke w/ via phone for pre-op interview---Pt Lab needs dos----  I stat 8             COVID test ------06-21-2020 at 1030 Arrive at -------1015 am 06-25-2020 NPO after MN NO Solid Food.  Clear liquids from MN until---915 am then npo Medications to take morning of surgery -----Levothyroxine Diabetic medication -----n/a Patient Special Instructions -----bring cpap mask tubing and machine and leave in car Pre-Op special Istructions -----none Patient verbalized understanding of instructions that were given at this phone interview. Patient denies shortness of breath, chest pain, fever, cough a this phone interview.  Anesthesia : recent fatigue has cardiac clearance uses cpcp, new ckd stage 3 dx, patient states no cardica S & s or sob at pre op phone call, pt stopped 81 mg aspirin on 06-16-2020 per dr pace  Chart to Janett Billow zanetto pa for review  PCP: dr Felicie Morn Cardiologist :dr Shawna [place] christopher 06-13-2020 cardiac clearance epic Chest x-ray :none EKG : 04-10-2020 epic Echo :05-03-2020 epic Ett: 05-24-2020 epic Cardiac Cath : none lov nephrology dr Rica Mast Jeri Cos kidney 06-14-2020 on chart Activity level: walks 2 miles seveal times a week without difficulty, can climb flight of steps without sob or difficulty Sleep Study/ CPAP :uses cpap nightly, does not know when last sleep study done and does not know cpap settings Fasting Blood Sugar :      / Checks Blood Sugar -- times a day:  n/a Blood Thinner/ Instructions /Last Dose:n/a ASA / Instructions/ Last Dose : pt stopped 81 mg  aspirin on 06-16-2020 per dr pace   Left 4th ringer finger stuck

## 2020-06-20 NOTE — Progress Notes (Signed)
Patient requested lov nephrology 06-14-2020 dr Lawson Radar be faxed to dr pace for review, lov faxed to [phone] and fax confirmation received and left voice mail message for selita patient wishes for dr pace to review lov nephrology note

## 2020-06-21 ENCOUNTER — Other Ambulatory Visit (HOSPITAL_COMMUNITY)
Admission: RE | Admit: 2020-06-21 | Discharge: 2020-06-21 | Disposition: A | Discharge status: Home / Self Care | Payer: Medicare Other | Source: Ambulatory Visit | Attending: Urology | Admitting: Urology

## 2020-06-21 DIAGNOSIS — Z01812 Encounter for preprocedural laboratory examination: Secondary | ICD-10-CM | POA: Insufficient documentation

## 2020-06-21 DIAGNOSIS — Z20822 Contact with and (suspected) exposure to covid-19: Secondary | ICD-10-CM | POA: Diagnosis not present

## 2020-06-21 LAB — SARS CORONAVIRUS 2 (TAT 6-24 HRS): SARS Coronavirus 2: NEGATIVE

## 2020-06-24 NOTE — H&P (Signed)
CC/HPI: cc: surveillance cystoscopy   05/16/20: 86 year old man with a history of HG Ta bladder cancer diagnosed September 2018 with recurrence January 2021 comes in for surveillance cystoscopy. Patient also completed second induction BCG prior to today. Most recent pathology showed pTis from TURP. He has a history of BPH with multiple TURP for regrowth. Patient experience gross hematuria earlier this month which has resolved. He does report having history of clot urinary retention x2. No dysuria or symptoms of UTI.     ALLERGIES: Cipro TABS - Diarrhea, **Diarrhea if taking 750mg , 500mg  ok to take**    MEDICATIONS: Levothyroxine Sodium 50 mcg tablet  Aspir 81 81 mg tablet, delayed release  Cefpodoxime Proxetil  Finasteride 5 mg tablet 1 tablet PO Daily  Irbesartan 300 mg tablet  Pantoprazole Sodium 40 mg tablet, delayed release  Rosuvastatin Calcium 5 mg tablet  Silodosin 8 mg capsule 1 capsule PO Q HS     GU PSH: Bladder Instill AntiCA Agent - 03/06/2020, 02/28/2020, 02/21/2020, 02/14/2020, 02/07/2020, 01/31/2020, 2019, 11/17/2017, 2018, 2018, 2018, 2018 Cystoscopy - 11/09/2019, 08/11/2019, 05/05/2019, 02/02/2019, 11/01/2018, 08/05/2018, 2019, 2019, 2018 Cystoscopy Insert Stent, Right - 2018 Cystoscopy TURBT >5 cm - 12/08/2019, 2018 Cystoscopy TURBT 2-5 cm - 2018 Cystoscopy TURP Locm 300-399Mg /Ml Iodine,1Ml - 2018 Remove Prostate Regrowth - 12/08/2019     NON-GU PSH: Cataract surgery - 2019 Knee Arthroscopy, Left     GU PMH: Gross hematuria, Improved - 01/26/2020, - 01/19/2020, - 12/15/2019, - 11/09/2019 (Improving, Chronic), Reassured at this time if urine is only light pink/clear cherry he does not need admitting for CBI. He will continue with ABX as Rx'd and proceed with cysto on 09/20/17 as scheduled. Instructed pt and family that if he develops gross hematuria with clots will need to go immediately to [facility] for evaluation. Culture urine. Will continue with current ABX. , - 2018, - 2018, - 2018,  - 2018 Urinary Retention - 01/19/2020 Bladder Cancer overlapping sites - 08/11/2019, - 05/05/2019, - 02/02/2019, - 11/01/2018, - 08/05/2018, - 2019, - 2019, - 2018, - 2018 BPH w/LUTS - 08/11/2019, - 05/05/2019, - 02/02/2019, - 11/01/2018, - 08/05/2018, - 2019, - 2019, - 2018, Benign prostatic hyperplasia with urinary obstruction, - 2014 ED due to arterial insufficiency, Erectile dysfunction due to arterial insufficiency - 2014 Urinary Retention, Unspec, Incomplete bladder emptying - 2014 History of bladder cancer      PMH Notes: .   NON-GU PMH: Arthritis GERD Hypercholesterolemia Hypertension Hypothyroidism    FAMILY HISTORY: Alzheimer's Disease - Mother Brain tumor - Mother Breast Cancer - Mother Cardiac Devices Pacemaker Present - Sister copd - Father Death of family member - Mother, Father Diabetes - Brother Hypertension - Runs In Family Kidney Stones - Son, Runs in Family    Notes: 2 sons; 1 daughter   SOCIAL HISTORY: Marital Status: Widowed Preferred Language: English; Ethnicity: Not Hispanic Or Latino; Race: White Current Smoking Status: Patient has never smoked.   Tobacco Use Assessment Completed: Used Tobacco in last 30 days? Drinks 4 drinks per month.  Drinks 4+ caffeinated drinks per day. Patient's occupation is/was retired.    REVIEW OF SYSTEMS:    GU Review Male:   Patient denies frequent urination, hard to postpone urination, burning/ pain with urination, get up at night to urinate, leakage of urine, stream starts and stops, trouble starting your stream, have to strain to urinate , erection problems, and penile pain.  Gastrointestinal (Upper):   Patient denies nausea, vomiting, and indigestion/ heartburn.  Gastrointestinal (Lower):   Patient  denies diarrhea and constipation.  Constitutional:   Patient denies fever, night sweats, weight loss, and fatigue.  Skin:   Patient denies skin rash/ lesion and itching.  Eyes:   Patient denies blurred vision and double vision.   Ears/ Nose/ Throat:   Patient denies sore throat and sinus problems.  Hematologic/Lymphatic:   Patient denies swollen glands and easy bruising.  Cardiovascular:   Patient denies leg swelling and chest pains.  Respiratory:   Patient denies cough and shortness of breath.  Endocrine:   Patient denies excessive thirst.  Musculoskeletal:   Patient denies back pain and joint pain.  Neurological:   Patient denies headaches and dizziness.  Psychologic:   Patient denies depression and anxiety.   VITAL SIGNS: None   MULTI-SYSTEM PHYSICAL EXAMINATION:    Constitutional: Well-nourished. No physical deformities. Normally developed. Good grooming.  Neck: Neck symmetrical, not swollen. Normal tracheal position.  Respiratory: No labored breathing, no use of accessory muscles.   Cardiovascular: Normal temperature  Skin: No paleness, no jaundice, no cyanosis. No lesion, no ulcer, no rash.  Neurologic / Psychiatric: Oriented to time, oriented to place, oriented to person. No depression, no anxiety, no agitation.  Gastrointestinal: No rigidity, non obese abdomen.   Eyes: Normal conjunctivae. Normal eyelids.  Ears, Nose, Mouth, and Throat: Left ear no scars, no lesions, no masses. Right ear no scars, no lesions, no masses. Nose no scars, no lesions, no masses. Normal hearing. Normal lips.  Musculoskeletal: Normal gait and station of head and neck.     Complexity of Data:  Records Review:   Previous Patient Records, POC Tool  Urine Test Review:   Urinalysis   08/18/08 02/20/08 02/17/07 10/14/05 06/23/04 07/23/03  PSA  Total PSA 2.00  2.49  2.38  2.24  2.29  2.47   Free PSA   0.79      % Free PSA   33.2       Notes:                     01/18/2020: BUN 25, creatinine 1.52   PROCEDURES:         Flexible Cystoscopy - 52000  Risks, benefits, and some of the potential complications of the procedure were discussed at length with the patient including infection, bleeding, voiding discomfort, urinary  retention, fever, chills, sepsis, and others. All questions were answered. Informed consent was obtained. Sterile technique and intraurethral analgesia were used.  Meatus:  Normal size. Normal location. Normal condition.  Urethra:  No strictures.  External Sphincter:  Normal.  Verumontanum:  Normal.  Prostate:  Prior evidence of TURP. Nodular regrowth on right bladder neck.  Bladder Neck:  Non-obstructing.  Ureteral Orifices:  Normal location. Normal size. Normal shape. Effluxed clear urine.  Bladder:  No trabeculation. Well-healed prior TUR scar adjacent to right ureteral orifice. Patient with small erythematous lesions in posterior superior bladder wall consistent with early bladder cancer recurrence.       The lower urinary tract was carefully examined. The procedure was well-tolerated and without complications. Instructions were given to call the office immediately for bloody urine, difficulty urinating, urinary retention, painful or frequent urination, fever, chills, nausea, vomiting or other illness. The patient stated that he understood these instructions and would comply with them.         Urinalysis w/Scope Dipstick Dipstick Cont'd Micro  Color: Amber Bilirubin: Neg mg/dL WBC/hpf: 6 - 10/hpf  Appearance: Clear Ketones: Neg mg/dL RBC/hpf: 40 - 60/hpf  Specific Gravity: 1.020  Blood: 3+ ery/uL Bacteria: Few (10-25/hpf)  pH: 5.5 Protein: 1+ mg/dL Cystals: NS (Not Seen)  Glucose: Neg mg/dL Urobilinogen: 0.2 mg/dL Casts: NS (Not Seen)    Nitrites: Neg Trichomonas: Not Present    Leukocyte Esterase: 1+ leu/uL Mucous: Not Present      Epithelial Cells: 0 - 5/hpf      Yeast: NS (Not Seen)      Sperm: Not Present    ASSESSMENT:      ICD-10 Details  1 GU:   Bladder Cancer overlapping sites - C67.8 Chronic, Worsening - Patient with erythematous lesions concerning for bladder cancer recurrence. He does have a history of high-gradeTa/Tis bladder cancer. We discussed proceeding with  cystoscopy and fulguration either in the office or operating room. Patient for 1st a DIS in the operating room. As he has had clot retention x2 in the past I will try and avoid resection/biopsy and proceed only with fulguration if lesions appear consistent with low-grade recurrence. Risks and benefits of the procedure were discussed with the patient including but not limited to bleeding, pain, need for Foley catheter, recurrence, damage to surrounding structures, bladder perforation.  2   BPH w/LUTS - N40.1 Chronic, Stable

## 2020-06-25 ENCOUNTER — Encounter (HOSPITAL_BASED_OUTPATIENT_CLINIC_OR_DEPARTMENT_OTHER)
Admission: RE | Disposition: A | Discharge status: Home / Self Care | Payer: Self-pay | Source: Home / Self Care | Attending: Urology

## 2020-06-25 ENCOUNTER — Ambulatory Visit (HOSPITAL_BASED_OUTPATIENT_CLINIC_OR_DEPARTMENT_OTHER): Payer: Medicare Other | Admitting: Anesthesiology

## 2020-06-25 ENCOUNTER — Encounter (HOSPITAL_BASED_OUTPATIENT_CLINIC_OR_DEPARTMENT_OTHER): Payer: Self-pay | Admitting: Urology

## 2020-06-25 ENCOUNTER — Ambulatory Visit (HOSPITAL_BASED_OUTPATIENT_CLINIC_OR_DEPARTMENT_OTHER)
Admission: RE | Admit: 2020-06-25 | Discharge: 2020-06-25 | Disposition: A | Discharge status: Home / Self Care | Payer: Medicare Other | Attending: Urology | Admitting: Urology

## 2020-06-25 DIAGNOSIS — R339 Retention of urine, unspecified: Secondary | ICD-10-CM | POA: Diagnosis not present

## 2020-06-25 DIAGNOSIS — C679 Malignant neoplasm of bladder, unspecified: Secondary | ICD-10-CM | POA: Diagnosis not present

## 2020-06-25 DIAGNOSIS — N400 Enlarged prostate without lower urinary tract symptoms: Secondary | ICD-10-CM | POA: Insufficient documentation

## 2020-06-25 DIAGNOSIS — I10 Essential (primary) hypertension: Secondary | ICD-10-CM | POA: Diagnosis not present

## 2020-06-25 DIAGNOSIS — C678 Malignant neoplasm of overlapping sites of bladder: Secondary | ICD-10-CM | POA: Diagnosis not present

## 2020-06-25 DIAGNOSIS — N189 Chronic kidney disease, unspecified: Secondary | ICD-10-CM | POA: Diagnosis not present

## 2020-06-25 DIAGNOSIS — K219 Gastro-esophageal reflux disease without esophagitis: Secondary | ICD-10-CM | POA: Insufficient documentation

## 2020-06-25 DIAGNOSIS — Z881 Allergy status to other antibiotic agents status: Secondary | ICD-10-CM | POA: Insufficient documentation

## 2020-06-25 DIAGNOSIS — M1990 Unspecified osteoarthritis, unspecified site: Secondary | ICD-10-CM | POA: Insufficient documentation

## 2020-06-25 DIAGNOSIS — Z803 Family history of malignant neoplasm of breast: Secondary | ICD-10-CM | POA: Insufficient documentation

## 2020-06-25 DIAGNOSIS — E039 Hypothyroidism, unspecified: Secondary | ICD-10-CM | POA: Insufficient documentation

## 2020-06-25 DIAGNOSIS — Z8249 Family history of ischemic heart disease and other diseases of the circulatory system: Secondary | ICD-10-CM | POA: Diagnosis not present

## 2020-06-25 DIAGNOSIS — E7800 Pure hypercholesterolemia, unspecified: Secondary | ICD-10-CM | POA: Insufficient documentation

## 2020-06-25 DIAGNOSIS — D090 Carcinoma in situ of bladder: Secondary | ICD-10-CM | POA: Diagnosis not present

## 2020-06-25 DIAGNOSIS — I129 Hypertensive chronic kidney disease with stage 1 through stage 4 chronic kidney disease, or unspecified chronic kidney disease: Secondary | ICD-10-CM | POA: Diagnosis not present

## 2020-06-25 HISTORY — DX: Other fatigue: R53.83

## 2020-06-25 HISTORY — PX: CYSTOSCOPY WITH BIOPSY: SHX5122

## 2020-06-25 LAB — POCT I-STAT, CHEM 8
BUN: 33 mg/dL — ABNORMAL HIGH (ref 8–23)
Calcium, Ion: 1.25 mmol/L (ref 1.15–1.40)
Chloride: 107 mmol/L (ref 98–111)
Creatinine, Ser: 2 mg/dL — ABNORMAL HIGH (ref 0.61–1.24)
Glucose, Bld: 100 mg/dL — ABNORMAL HIGH (ref 70–99)
HCT: 39 % (ref 39.0–52.0)
Hemoglobin: 13.3 g/dL (ref 13.0–17.0)
Potassium: 4.1 mmol/L (ref 3.5–5.1)
Sodium: 143 mmol/L (ref 135–145)
TCO2: 21 mmol/L — ABNORMAL LOW (ref 22–32)

## 2020-06-25 SURGERY — CYSTOSCOPY, WITH BIOPSY
Anesthesia: General | Site: Bladder

## 2020-06-25 MED ORDER — SODIUM CHLORIDE 0.9 % IR SOLN
Status: DC | PRN
  Administered 2020-06-25 (×2): 3000 mL

## 2020-06-25 MED ORDER — SODIUM CHLORIDE 0.9 % IR SOLN: Status: DC | PRN

## 2020-06-25 MED ORDER — ONDANSETRON HCL 4 MG/2ML IJ SOLN: 4.0000 mg | Freq: Once | INTRAMUSCULAR | Status: DC | PRN

## 2020-06-25 MED ORDER — ONDANSETRON HCL 4 MG/2ML IJ SOLN
INTRAMUSCULAR | Status: DC | PRN
  Administered 2020-06-25: 4 mg via INTRAVENOUS

## 2020-06-25 MED ORDER — FENTANYL CITRATE (PF) 100 MCG/2ML IJ SOLN: 25.0000 ug | INTRAMUSCULAR | Status: DC | PRN

## 2020-06-25 MED ORDER — SODIUM CHLORIDE 0.9 % IV SOLN: INTRAVENOUS | Status: DC

## 2020-06-25 MED ORDER — LIDOCAINE 2% (20 MG/ML) 5 ML SYRINGE
INTRAMUSCULAR | Status: DC | PRN
  Administered 2020-06-25: 100 mg via INTRAVENOUS

## 2020-06-25 MED ORDER — ONDANSETRON HCL 4 MG/2ML IJ SOLN
INTRAMUSCULAR | Status: AC
  Filled 2020-06-25: qty 2

## 2020-06-25 MED ORDER — EPHEDRINE 5 MG/ML INJ
INTRAVENOUS | Status: AC
  Filled 2020-06-25: qty 10

## 2020-06-25 MED ORDER — STERILE WATER FOR IRRIGATION IR SOLN
Status: DC | PRN
  Administered 2020-06-25: 3000 mL

## 2020-06-25 MED ORDER — PROPOFOL 10 MG/ML IV BOLUS
INTRAVENOUS | Status: AC
  Filled 2020-06-25: qty 20

## 2020-06-25 MED ORDER — CIPROFLOXACIN IN D5W 400 MG/200ML IV SOLN
INTRAVENOUS | Status: AC
  Filled 2020-06-25: qty 200

## 2020-06-25 MED ORDER — EPHEDRINE SULFATE 50 MG/ML IJ SOLN
INTRAMUSCULAR | Status: DC | PRN
Start: 2020-06-25 — End: 2020-06-25
  Administered 2020-06-25: 10 mg via INTRAVENOUS

## 2020-06-25 MED ORDER — CIPROFLOXACIN IN D5W 400 MG/200ML IV SOLN
INTRAVENOUS | Status: DC | PRN
  Administered 2020-06-25: 400 mg via INTRAVENOUS

## 2020-06-25 MED ORDER — FENTANYL CITRATE (PF) 100 MCG/2ML IJ SOLN
INTRAMUSCULAR | Status: DC | PRN
  Administered 2020-06-25 (×4): 25 ug via INTRAVENOUS

## 2020-06-25 MED ORDER — LIDOCAINE 2% (20 MG/ML) 5 ML SYRINGE
INTRAMUSCULAR | Status: AC
  Filled 2020-06-25: qty 5

## 2020-06-25 MED ORDER — PHENAZOPYRIDINE HCL 200 MG PO TABS
200.0000 mg | ORAL_TABLET | Freq: Three times a day (TID) | ORAL | 0 refills | Status: DC | PRN
Start: 2020-06-25 — End: 2021-04-09

## 2020-06-25 MED ORDER — FENTANYL CITRATE (PF) 100 MCG/2ML IJ SOLN
INTRAMUSCULAR | Status: AC
  Filled 2020-06-25: qty 2

## 2020-06-25 MED ORDER — PROPOFOL 10 MG/ML IV BOLUS
INTRAVENOUS | Status: DC | PRN
  Administered 2020-06-25 (×2): 50 mg via INTRAVENOUS
  Administered 2020-06-25: 150 mg via INTRAVENOUS
  Administered 2020-06-25: 50 mg via INTRAVENOUS

## 2020-06-25 SURGICAL SUPPLY — 17 items
BAG DRAIN URO-CYSTO SKYTR STRL (DRAIN) ×3 IMPLANT
BAG DRN UROCATH (DRAIN) ×1
CLOTH BEACON ORANGE TIMEOUT ST (SAFETY) ×3 IMPLANT
ELECT REM PT RETURN 9FT ADLT (ELECTROSURGICAL) ×3
ELECTRODE REM PT RTRN 9FT ADLT (ELECTROSURGICAL) ×1 IMPLANT
GLOVE BIO SURGEON STRL SZ 6.5 (GLOVE) ×2 IMPLANT
GLOVE BIO SURGEONS STRL SZ 6.5 (GLOVE) ×1
GOWN STRL REUS W/TWL LRG LVL3 (GOWN DISPOSABLE) ×3 IMPLANT
IV NS IRRIG 3000ML ARTHROMATIC (IV SOLUTION) ×4 IMPLANT
KIT TURNOVER CYSTO (KITS) ×3 IMPLANT
LOOP CUT BIPOLAR 24F LRG (ELECTROSURGICAL) ×2 IMPLANT
MANIFOLD NEPTUNE II (INSTRUMENTS) ×3 IMPLANT
PACK CYSTO (CUSTOM PROCEDURE TRAY) ×3 IMPLANT
TUBE CONNECTING 12'X1/4 (SUCTIONS) ×1
TUBE CONNECTING 12X1/4 (SUCTIONS) ×2 IMPLANT
TUBING UROLOGY SET (TUBING) ×3 IMPLANT
WATER STERILE IRR 3000ML UROMA (IV SOLUTION) ×2 IMPLANT

## 2020-06-25 NOTE — Anesthesia Preprocedure Evaluation (Signed)
Anesthesia Evaluation  Patient identified by MRN, date of birth, ID band Patient awake    Reviewed: Allergy & Precautions, NPO status , Patient's Chart, lab work & pertinent test results  History of Anesthesia Complications Negative for: history of anesthetic complications  Airway Mallampati: II  TM Distance: >3 FB Neck ROM: Full    Dental  (+) Dental Advisory Given, Teeth Intact   Pulmonary sleep apnea and Continuous Positive Airway Pressure Ventilation ,    Pulmonary exam normal breath sounds clear to auscultation       Cardiovascular hypertension, Pt. on medications Normal cardiovascular exam Rhythm:Regular  Echo 05/03/2020 1. Left ventricular ejection fraction, by estimation, is 55 to 60%. The left ventricle has normal function. The left ventricle has no regional wall motion abnormalities. There is mild concentric left ventricular hypertrophy. Left ventricular diastolic parameters were normal.  2. Right ventricular systolic function is normal. The right ventricular size is normal.  3. Left atrial size was mildly dilated.  4. The mitral valve is normal in structure. Trivial mitral valve regurgitation. No evidence of mitral stenosis.  5. The aortic valve is tricuspid. Aortic valve regurgitation is mild. Mild aortic valve sclerosis is present, with no evidence of aortic valve stenosis.  6. There is mild dilatation of the ascending aorta measuring 39 mm.  7. The inferior vena cava is normal in size with greater than 50% respiratory variability, suggesting right atrial pressure of 3 mmHg.    Neuro/Psych negative neurological ROS  negative psych ROS   GI/Hepatic Neg liver ROS, GERD  Medicated and Controlled,  Endo/Other  Hypothyroidism   Renal/GU CRFRenal disease    Incontinence BPH Bladder cancer s/p TURBT     Musculoskeletal  (+) Arthritis ,   Abdominal   Peds  Hematology negative hematology ROS (+)    Anesthesia Other Findings   Reproductive/Obstetrics                            Anesthesia Physical  Anesthesia Plan  ASA: III  Anesthesia Plan: General   Post-op Pain Management:    Induction: Intravenous  PONV Risk Score and Plan: 3 and Treatment may vary due to age or medical condition and Ondansetron  Airway Management Planned: LMA  Additional Equipment: None  Intra-op Plan:   Post-operative Plan: Extubation in OR  Informed Consent: I have reviewed the patients History and Physical, chart, labs and discussed the procedure including the risks, benefits and alternatives for the proposed anesthesia with the patient or authorized representative who has indicated his/her understanding and acceptance.     Dental advisory given  Plan Discussed with: CRNA  Anesthesia Plan Comments:         Anesthesia Quick Evaluation

## 2020-06-25 NOTE — Transfer of Care (Signed)
Immediate Anesthesia Transfer of Care Note  Patient: [name] [name]  Procedure(s) Performed: Procedure(s) (LRB): CYSTOSCOPY FULGURATION   BLADDER  BIOPSY (N/A)  Patient Location: PACU  Anesthesia Type: General  Level of Consciousness: awake, sedated, patient cooperative and responds to stimulation  Airway & Oxygen Therapy: Patient Spontanous Breathing and Patient connected to NC02 and soft FM  Post-op Assessment: Report given to PACU RN, Post -op Vital signs reviewed and stable and Patient moving all extremities  Post vital signs: Reviewed and stable  Complications: No apparent anesthesia complications

## 2020-06-25 NOTE — Discharge Instructions (Signed)
Post Bladder Surgery Instructions   General instructions:     Your recent bladder surgery requires very little post hospital care but some definite precautions.  Despite the fact that no skin incisions were used, the area around the bladder incisions are raw and covered with scabs to promote healing and prevent bleeding. Certain precautions are needed to insure that the scabs are not disturbed over the next 2-4 weeks while the healing proceeds.  Because the raw surface inside your bladder and the irritating effects of urine you may expect frequency of urination and/or urgency (a stronger desire to urinate) and perhaps even getting up at night more often. This will usually resolve or improve slowly over the healing period. You may see some blood in your urine over the first 6 weeks. Do not be alarmed, even if the urine was clear for a while. Get off your feet and drink lots of fluids until clearing occurs. If you start to pass clots or don't improve call us.  Catheter: (If you are discharged with a catheter.)  1. Keep your catheter secured to your leg at all times with tape or the supplied strap. 2. You may experience leakage of urine around your catheter- as long as the  catheter continues to drain, this is normal.  If your catheter stops draining  go to the ER. 3. You may also have blood in your urine, even after it has been clear for  several days; you may even pass some small blood clots or other material.  This  is normal as well.  If this happens, sit down and drink plenty of water to help  make urine to flush out your bladder.  If the blood in your urine becomes worse  after doing this, contact our office or return to the ER. 4. You may use the leg bag (small bag) during the day, but use the large bag at  night.  Diet:  You may return to your normal diet immediately. Because of the raw surface of your bladder, alcohol, spicy foods, foods high in acid and drinks with caffeine may  cause irritation or frequency and should be used in moderation. To keep your urine flowing freely and avoid constipation, drink plenty of fluids during the day (8-10 glasses). Tip: Avoid cranberry juice because it is very acidic.  Activity:  Your physical activity doesn't need to be restricted. However, if you are very active, you may see some blood in the urine. We suggest that you reduce your activity under the circumstances until the bleeding has stopped.  Bowels:  It is important to keep your bowels regular during the postoperative period. Straining with bowel movements can cause bleeding. A bowel movement every other day is reasonable. Use a mild laxative if needed, such as milk of magnesia 2-3 tablespoons, or 2 Dulcolax tablets. Call if you continue to have problems. If you had been taking narcotics for pain, before, during or after your surgery, you may be constipated. Take a laxative if necessary.    Medication:  You should resume your pre-surgery medications unless told not to. In addition you may be given an antibiotic to prevent or treat infection. Antibiotics are not always necessary. All medication should be taken as prescribed until the bottles are finished unless you are having an unusual reaction to one of the drugs.   Post Anesthesia Home Care Instructions  Activity: Get plenty of rest for the remainder of the day. A responsible adult should stay with you for  24 hours following the procedure.  For the next 24 hours, DO NOT: -Drive a car -Paediatric nurse -Drink alcoholic beverages -Take any medication unless instructed by your physician -Make any legal decisions or sign important papers.  Meals: Start with liquid foods such as gelatin or soup. Progress to regular foods as tolerated. Avoid greasy, spicy, heavy foods. If nausea and/or vomiting occur, drink only clear liquids until the nausea and/or vomiting subsides. Call your physician if vomiting continues.  Special  Instructions/Symptoms: Your throat may feel dry or sore from the anesthesia or the breathing tube placed in your throat during surgery. If this causes discomfort, gargle with warm salt water. The discomfort should disappear within 24 hours.  If you had a scopolamine patch placed behind your ear for the management of post- operative nausea and/or vomiting:  1. The medication in the patch is effective for 72 hours, after which it should be removed.  Wrap patch in a tissue and discard in the trash. Wash hands thoroughly with soap and water. 2. You may remove the patch earlier than 72 hours if you experience unpleasant side effects which may include dry mouth, dizziness or visual disturbances. 3. Avoid touching the patch. Wash your hands with soap and water after contact with the patch.

## 2020-06-25 NOTE — Anesthesia Postprocedure Evaluation (Signed)
Anesthesia Post Note  Patient: [name] [name]  Procedure(s) Performed: CYSTOSCOPY FULGURATION   BLADDER  BIOPSY (N/A Bladder)     Patient location during evaluation: PACU Anesthesia Type: General Level of consciousness: sedated and patient cooperative Pain management: pain level controlled Vital Signs Assessment: post-procedure vital signs reviewed and stable Respiratory status: spontaneous breathing Cardiovascular status: stable Anesthetic complications: no   No complications documented.  Last Vitals:  Vitals:   06/25/20 1400 06/25/20 1451  BP: (!) 150/82 (!) 151/78  Pulse: 75 69  Resp: 16 14  Temp:  (!) 36 C  SpO2: 100% 100%    Last Pain:  Vitals:   06/25/20 1451  TempSrc:   PainSc: 0-No pain                 Nolon Nations

## 2020-06-25 NOTE — Interval H&P Note (Signed)
History and Physical Interval Note:  06/25/2020 11:42 AM  [name] [name]  has presented today for surgery, with the diagnosis of BLADDER CANCER.  The various methods of treatment have been discussed with the patient and family. After consideration of risks, benefits and other options for treatment, the patient has consented to  Procedure(s): CYSTOSCOPY FULGURATION  POSSIBLE BLADDER  BIOPSY (N/A) as a surgical intervention.  The patient's history has been reviewed, patient examined, no change in status, stable for surgery.  I have reviewed the patient's chart and labs.  Questions were answered to the patient's satisfaction.     Gerhart Ruggieri D Zigmond Trela

## 2020-06-25 NOTE — Op Note (Signed)
PATIENT:  [name] [name]  PRE-OPERATIVE DIAGNOSIS: Bladder cancer recurrence  POST-OPERATIVE DIAGNOSIS: Same  PROCEDURE:  Procedure(s): 1. Cystoscopy, bladder biopsy, fulguration  SURGEON:  Surgeon(s): Jacalyn Lefevre, MD  ANESTHESIA:   General  EBL:  Minimal  DRAINS: none  SPECIMEN: Right lateral lobe of bladder biopsy  DISPOSITION OF SPECIMEN:  PATHOLOGY  Indication: 86 year old man with a history of high-grade TA bladder cancer diagnosed September 2018 with recurrence January 2021 followed by recurrence 05/16/2020 seen on surveillance cystoscopy.  He has already completed 2 rounds of induction BCG.  Description of operation: The patient was taken to the operating room and administered general anesthesia. They were then placed on the table and moved to the dorsal lithotomy position after which the genitalia was sterilely prepped and draped. An official timeout was then performed.  The 60 French resectoscope with the 30 lens and visual obturator were then passed into the bladder under direct visualization. Urethra appeared normal.  Inspection of the bladder  bladder showed previously resected ureteral orifices bilaterally.  There was widespread erythema with irregular mucosa most prominent on the right posterior bladder wall.  A cold cup bladder biopsy was taken of this area and sent to pathology.  The cystoscope was removed and resectoscope was then used to fulgurate the entire posterior superior wall.  The ureteral orifices were seen at the start in the end of the case and uninvolved.   Reinspection of the bladder revealed all obvious irregular area had been fully resected and there was no evidence of perforation.I then removed the resectoscope. The patient was awakened and taken to the recovery room.    PLAN OF CARE: Discharge to home after PACU  PATIENT DISPOSITION:  PACU - hemodynamically stable.

## 2020-06-25 NOTE — Anesthesia Procedure Notes (Signed)
Procedure Name: LMA Insertion Date/Time: 06/25/2020 12:28 PM Performed by: Justice Rocher, CRNA Pre-anesthesia Checklist: Patient identified, Emergency Drugs available, Suction available, Patient being monitored and Timeout performed Patient Re-evaluated:Patient Re-evaluated prior to induction Oxygen Delivery Method: Circle system utilized Preoxygenation: Pre-oxygenation with 100% oxygen Induction Type: IV induction Ventilation: Mask ventilation without difficulty LMA: LMA inserted LMA Size: 4.0 Number of attempts: 1 Airway Equipment and Method: Bite block Placement Confirmation: positive ETCO2,  CO2 detector and breath sounds checked- equal and bilateral Tube secured with: Tape Dental Injury: Teeth and Oropharynx as per pre-operative assessment

## 2020-06-26 ENCOUNTER — Encounter (HOSPITAL_BASED_OUTPATIENT_CLINIC_OR_DEPARTMENT_OTHER): Payer: Self-pay | Admitting: Urology

## 2020-06-26 DIAGNOSIS — G4737 Central sleep apnea in conditions classified elsewhere: Secondary | ICD-10-CM | POA: Diagnosis not present

## 2020-06-26 DIAGNOSIS — G4733 Obstructive sleep apnea (adult) (pediatric): Secondary | ICD-10-CM | POA: Diagnosis not present

## 2020-06-26 LAB — SURGICAL PATHOLOGY

## 2020-07-04 DIAGNOSIS — R778 Other specified abnormalities of plasma proteins: Secondary | ICD-10-CM | POA: Diagnosis not present

## 2020-07-05 DIAGNOSIS — Z8551 Personal history of malignant neoplasm of bladder: Secondary | ICD-10-CM | POA: Diagnosis not present

## 2020-07-05 DIAGNOSIS — N3000 Acute cystitis without hematuria: Secondary | ICD-10-CM | POA: Diagnosis not present

## 2020-07-11 DIAGNOSIS — C678 Malignant neoplasm of overlapping sites of bladder: Secondary | ICD-10-CM | POA: Diagnosis not present

## 2020-07-11 DIAGNOSIS — R338 Other retention of urine: Secondary | ICD-10-CM | POA: Diagnosis not present

## 2020-07-11 DIAGNOSIS — I10 Essential (primary) hypertension: Secondary | ICD-10-CM | POA: Diagnosis not present

## 2020-07-11 DIAGNOSIS — N1832 Chronic kidney disease, stage 3b: Secondary | ICD-10-CM | POA: Diagnosis not present

## 2020-07-11 DIAGNOSIS — E782 Mixed hyperlipidemia: Secondary | ICD-10-CM | POA: Diagnosis not present

## 2020-07-14 DIAGNOSIS — Z20822 Contact with and (suspected) exposure to covid-19: Secondary | ICD-10-CM | POA: Diagnosis not present

## 2020-07-18 DIAGNOSIS — N1832 Chronic kidney disease, stage 3b: Secondary | ICD-10-CM | POA: Diagnosis not present

## 2020-07-18 DIAGNOSIS — I129 Hypertensive chronic kidney disease with stage 1 through stage 4 chronic kidney disease, or unspecified chronic kidney disease: Secondary | ICD-10-CM | POA: Diagnosis not present

## 2020-07-18 DIAGNOSIS — E782 Mixed hyperlipidemia: Secondary | ICD-10-CM | POA: Diagnosis not present

## 2020-07-18 DIAGNOSIS — E039 Hypothyroidism, unspecified: Secondary | ICD-10-CM | POA: Diagnosis not present

## 2020-07-18 DIAGNOSIS — I10 Essential (primary) hypertension: Secondary | ICD-10-CM | POA: Diagnosis not present

## 2020-07-22 ENCOUNTER — Other Ambulatory Visit: Payer: Self-pay | Admitting: Urology

## 2020-07-27 DIAGNOSIS — G4733 Obstructive sleep apnea (adult) (pediatric): Secondary | ICD-10-CM | POA: Diagnosis not present

## 2020-07-27 DIAGNOSIS — G4737 Central sleep apnea in conditions classified elsewhere: Secondary | ICD-10-CM | POA: Diagnosis not present

## 2020-08-03 DIAGNOSIS — J069 Acute upper respiratory infection, unspecified: Secondary | ICD-10-CM | POA: Diagnosis not present

## 2020-08-03 DIAGNOSIS — Z9189 Other specified personal risk factors, not elsewhere classified: Secondary | ICD-10-CM | POA: Diagnosis not present

## 2020-08-03 DIAGNOSIS — Z20822 Contact with and (suspected) exposure to covid-19: Secondary | ICD-10-CM | POA: Diagnosis not present

## 2020-08-08 DIAGNOSIS — C678 Malignant neoplasm of overlapping sites of bladder: Secondary | ICD-10-CM | POA: Diagnosis not present

## 2020-08-08 DIAGNOSIS — R8279 Other abnormal findings on microbiological examination of urine: Secondary | ICD-10-CM | POA: Diagnosis not present

## 2020-08-09 DIAGNOSIS — Z23 Encounter for immunization: Secondary | ICD-10-CM | POA: Diagnosis not present

## 2020-08-13 DIAGNOSIS — G4737 Central sleep apnea in conditions classified elsewhere: Secondary | ICD-10-CM | POA: Diagnosis not present

## 2020-08-13 DIAGNOSIS — G4733 Obstructive sleep apnea (adult) (pediatric): Secondary | ICD-10-CM | POA: Diagnosis not present

## 2020-08-15 DIAGNOSIS — R8271 Bacteriuria: Secondary | ICD-10-CM | POA: Diagnosis not present

## 2020-08-15 DIAGNOSIS — C678 Malignant neoplasm of overlapping sites of bladder: Secondary | ICD-10-CM | POA: Diagnosis not present

## 2020-08-15 DIAGNOSIS — Z5111 Encounter for antineoplastic chemotherapy: Secondary | ICD-10-CM | POA: Diagnosis not present

## 2020-08-20 ENCOUNTER — Emergency Department (HOSPITAL_COMMUNITY): Payer: Medicare Other

## 2020-08-20 ENCOUNTER — Encounter (HOSPITAL_COMMUNITY): Payer: Self-pay | Admitting: Emergency Medicine

## 2020-08-20 ENCOUNTER — Other Ambulatory Visit: Payer: Self-pay

## 2020-08-20 ENCOUNTER — Observation Stay (HOSPITAL_COMMUNITY)
Admission: EM | Admit: 2020-08-20 | Discharge: 2020-08-23 | Disposition: A | Discharge status: Home / Self Care | Payer: Medicare Other | Attending: Emergency Medicine | Admitting: Emergency Medicine

## 2020-08-20 DIAGNOSIS — I129 Hypertensive chronic kidney disease with stage 1 through stage 4 chronic kidney disease, or unspecified chronic kidney disease: Secondary | ICD-10-CM | POA: Insufficient documentation

## 2020-08-20 DIAGNOSIS — E039 Hypothyroidism, unspecified: Secondary | ICD-10-CM | POA: Insufficient documentation

## 2020-08-20 DIAGNOSIS — R079 Chest pain, unspecified: Secondary | ICD-10-CM | POA: Diagnosis not present

## 2020-08-20 DIAGNOSIS — Z8551 Personal history of malignant neoplasm of bladder: Secondary | ICD-10-CM | POA: Diagnosis not present

## 2020-08-20 DIAGNOSIS — R0789 Other chest pain: Secondary | ICD-10-CM | POA: Diagnosis not present

## 2020-08-20 DIAGNOSIS — Z79899 Other long term (current) drug therapy: Secondary | ICD-10-CM | POA: Diagnosis not present

## 2020-08-20 DIAGNOSIS — Z7982 Long term (current) use of aspirin: Secondary | ICD-10-CM | POA: Diagnosis not present

## 2020-08-20 DIAGNOSIS — R110 Nausea: Secondary | ICD-10-CM

## 2020-08-20 DIAGNOSIS — Z20822 Contact with and (suspected) exposure to covid-19: Secondary | ICD-10-CM | POA: Diagnosis not present

## 2020-08-20 DIAGNOSIS — J9811 Atelectasis: Secondary | ICD-10-CM | POA: Diagnosis not present

## 2020-08-20 DIAGNOSIS — N183 Chronic kidney disease, stage 3 unspecified: Secondary | ICD-10-CM | POA: Diagnosis not present

## 2020-08-20 DIAGNOSIS — N390 Urinary tract infection, site not specified: Secondary | ICD-10-CM

## 2020-08-20 DIAGNOSIS — Z743 Need for continuous supervision: Secondary | ICD-10-CM | POA: Diagnosis not present

## 2020-08-20 DIAGNOSIS — Z0389 Encounter for observation for other suspected diseases and conditions ruled out: Secondary | ICD-10-CM | POA: Diagnosis not present

## 2020-08-20 DIAGNOSIS — G4489 Other headache syndrome: Secondary | ICD-10-CM | POA: Diagnosis not present

## 2020-08-20 DIAGNOSIS — R0602 Shortness of breath: Secondary | ICD-10-CM | POA: Diagnosis not present

## 2020-08-20 HISTORY — DX: Urinary tract infection, site not specified: N39.0

## 2020-08-20 LAB — URINALYSIS, COMPLETE (UACMP) WITH MICROSCOPIC
Bilirubin Urine: NEGATIVE
Glucose, UA: NEGATIVE mg/dL
Ketones, ur: NEGATIVE mg/dL
Nitrite: NEGATIVE
Protein, ur: 30 mg/dL — AB
RBC / HPF: >50 RBC/hpf — ABNORMAL HIGH (ref 0–5)
Specific Gravity, Urine: 1.01 (ref 1.005–1.030)
pH: 5 (ref 5.0–8.0)

## 2020-08-20 LAB — CBC WITH DIFFERENTIAL/PLATELET
Abs Immature Granulocytes: 0.05 10*3/uL (ref 0.00–0.07)
Basophils Absolute: 0 10*3/uL (ref 0.0–0.1)
Basophils Relative: 0 %
Eosinophils Absolute: 0.1 10*3/uL (ref 0.0–0.5)
Eosinophils Relative: 1 %
HCT: 38 % — ABNORMAL LOW (ref 39.0–52.0)
Hemoglobin: 12.8 g/dL — ABNORMAL LOW (ref 13.0–17.0)
Immature Granulocytes: 0 %
Lymphocytes Relative: 2 %
Lymphs Abs: 0.3 10*3/uL — ABNORMAL LOW (ref 0.7–4.0)
MCH: 29.9 pg (ref 26.0–34.0)
MCHC: 33.7 g/dL (ref 30.0–36.0)
MCV: 88.8 fL (ref 80.0–100.0)
Monocytes Absolute: 0.6 10*3/uL (ref 0.1–1.0)
Monocytes Relative: 4 %
Neutro Abs: 13.6 10*3/uL — ABNORMAL HIGH (ref 1.7–7.7)
Neutrophils Relative %: 93 %
Platelets: 258 10*3/uL (ref 150–400)
RBC: 4.28 MIL/uL (ref 4.22–5.81)
RDW: 14.6 % (ref 11.5–15.5)
WBC: 14.6 10*3/uL — ABNORMAL HIGH (ref 4.0–10.5)
nRBC: 0 % (ref 0.0–0.2)

## 2020-08-20 LAB — RESPIRATORY PANEL BY RT PCR (FLU A&B, COVID)
Influenza A by PCR: NEGATIVE
Influenza B by PCR: NEGATIVE
SARS Coronavirus 2 by RT PCR: NEGATIVE

## 2020-08-20 LAB — LIPID PANEL
Cholesterol: 117 mg/dL (ref 0–200)
HDL: 43 mg/dL (ref >40)
LDL Cholesterol: 63 mg/dL (ref 0–99)
Total CHOL/HDL Ratio: 2.7 RATIO
Triglycerides: 54 mg/dL (ref <150)
VLDL: 11 mg/dL (ref 0–40)

## 2020-08-20 LAB — I-STAT CHEM 8, ED
BUN: 30 mg/dL — ABNORMAL HIGH (ref 8–23)
Calcium, Ion: 1.2 mmol/L (ref 1.15–1.40)
Chloride: 109 mmol/L (ref 98–111)
Creatinine, Ser: 2 mg/dL — ABNORMAL HIGH (ref 0.61–1.24)
Glucose, Bld: 129 mg/dL — ABNORMAL HIGH (ref 70–99)
HCT: 37 % — ABNORMAL LOW (ref 39.0–52.0)
Hemoglobin: 12.6 g/dL — ABNORMAL LOW (ref 13.0–17.0)
Potassium: 4.1 mmol/L (ref 3.5–5.1)
Sodium: 142 mmol/L (ref 135–145)
TCO2: 22 mmol/L (ref 22–32)

## 2020-08-20 LAB — BASIC METABOLIC PANEL
Anion gap: 13 (ref 5–15)
BUN: 31 mg/dL — ABNORMAL HIGH (ref 8–23)
CO2: 19 mmol/L — ABNORMAL LOW (ref 22–32)
Calcium: 9.3 mg/dL (ref 8.9–10.3)
Chloride: 105 mmol/L (ref 98–111)
Creatinine, Ser: 2.03 mg/dL — ABNORMAL HIGH (ref 0.61–1.24)
GFR calc Af Amer: 34 mL/min — ABNORMAL LOW (ref >60)
GFR calc non Af Amer: 29 mL/min — ABNORMAL LOW (ref >60)
Glucose, Bld: 154 mg/dL — ABNORMAL HIGH (ref 70–99)
Potassium: 3.7 mmol/L (ref 3.5–5.1)
Sodium: 137 mmol/L (ref 135–145)

## 2020-08-20 LAB — D-DIMER, QUANTITATIVE: D-Dimer, Quant: 4.36 ug/mL-FEU — ABNORMAL HIGH (ref 0.00–0.50)

## 2020-08-20 LAB — TROPONIN I (HIGH SENSITIVITY)
Troponin I (High Sensitivity): 7 ng/L (ref <18)
Troponin I (High Sensitivity): 9 ng/L (ref <18)

## 2020-08-20 LAB — BRAIN NATRIURETIC PEPTIDE: B Natriuretic Peptide: 87.5 pg/mL (ref 0.0–100.0)

## 2020-08-20 MED ORDER — ASPIRIN EC 81 MG PO TBEC
81.0000 mg | DELAYED_RELEASE_TABLET | Freq: Every day | ORAL | Status: DC
  Administered 2020-08-20 – 2020-08-22 (×3): 81 mg via ORAL
  Filled 2020-08-20 (×3): qty 1

## 2020-08-20 MED ORDER — CIPROFLOXACIN HCL 500 MG PO TABS
500.0000 mg | ORAL_TABLET | Freq: Every day | ORAL | Status: DC
  Administered 2020-08-20 – 2020-08-21 (×2): 500 mg via ORAL
  Filled 2020-08-20 (×2): qty 1

## 2020-08-20 MED ORDER — ENOXAPARIN SODIUM 30 MG/0.3ML ~~LOC~~ SOLN
30.0000 mg | Freq: Every day | SUBCUTANEOUS | Status: DC
  Administered 2020-08-20 – 2020-08-22 (×3): 30 mg via SUBCUTANEOUS
  Filled 2020-08-20 (×3): qty 0.3

## 2020-08-20 MED ORDER — PSYLLIUM 95 % PO PACK
1.0000 | PACK | Freq: Every day | ORAL | Status: DC
  Administered 2020-08-20 – 2020-08-22 (×3): 1 via ORAL
  Filled 2020-08-20 (×4): qty 1

## 2020-08-20 MED ORDER — IRBESARTAN 300 MG PO TABS
300.0000 mg | ORAL_TABLET | Freq: Every day | ORAL | Status: DC
  Administered 2020-08-20 – 2020-08-21 (×2): 300 mg via ORAL
  Filled 2020-08-20 (×3): qty 1

## 2020-08-20 MED ORDER — PANTOPRAZOLE SODIUM 40 MG PO TBEC
40.0000 mg | DELAYED_RELEASE_TABLET | Freq: Every day | ORAL | Status: DC
  Administered 2020-08-20 – 2020-08-22 (×3): 40 mg via ORAL
  Filled 2020-08-20 (×3): qty 1

## 2020-08-20 MED ORDER — ONDANSETRON HCL 4 MG/2ML IJ SOLN: 4.0000 mg | Freq: Four times a day (QID) | INTRAMUSCULAR | Status: DC | PRN

## 2020-08-20 MED ORDER — ROSUVASTATIN CALCIUM 5 MG PO TABS
5.0000 mg | ORAL_TABLET | Freq: Every day | ORAL | Status: DC
  Administered 2020-08-20 – 2020-08-22 (×3): 5 mg via ORAL
  Filled 2020-08-20 (×4): qty 1

## 2020-08-20 MED ORDER — TAMSULOSIN HCL 0.4 MG PO CAPS
0.4000 mg | ORAL_CAPSULE | Freq: Every day | ORAL | Status: DC
  Administered 2020-08-20 – 2020-08-23 (×4): 0.4 mg via ORAL
  Filled 2020-08-20 (×4): qty 1

## 2020-08-20 MED ORDER — SODIUM CHLORIDE 0.9 % IV BOLUS
1000.0000 mL | Freq: Once | INTRAVENOUS | Status: AC
  Administered 2020-08-20: 1000 mL via INTRAVENOUS

## 2020-08-20 MED ORDER — LEVOTHYROXINE SODIUM 50 MCG PO TABS
50.0000 ug | ORAL_TABLET | Freq: Every day | ORAL | Status: DC
  Administered 2020-08-21 – 2020-08-23 (×3): 50 ug via ORAL
  Filled 2020-08-20 (×3): qty 1

## 2020-08-20 MED ORDER — TECHNETIUM TO 99M ALBUMIN AGGREGATED
3.8000 | Freq: Once | INTRAVENOUS | Status: AC | PRN
  Administered 2020-08-20: 3.8 via INTRAVENOUS

## 2020-08-20 MED ORDER — ACETAMINOPHEN 325 MG PO TABS
650.0000 mg | ORAL_TABLET | ORAL | Status: DC | PRN
  Administered 2020-08-20 – 2020-08-22 (×3): 650 mg via ORAL
  Filled 2020-08-20 (×3): qty 2

## 2020-08-20 NOTE — ED Notes (Signed)
Pt at [facility] 

## 2020-08-20 NOTE — H&P (Signed)
History and Physical    RICCARDO [name] HKV:[id number] DOB: 1932/01/19 DOA: 08/20/2020  PCP: [name], MD  Patient coming from: Home  Chief Complaint: Chest pain, heaviness  HPI: [name] [name] is a 86 y.o. male with medical history significant of bladder cancer. Presents w/ chest discomfort starting last night. He reports that he woke from sleep early this morning with a heaviness in his chest and shortness of breath. It wasn't sharp or radiating. He did have diaphoresis and nausea. He denies any other symptoms. He reports no alleviating or aggravating factors. He was concerned and came to the ED.   He notes that he started a new antibiotic (macrobid) last night for UTI. He states he's never had this medication before, and he is wondering if this is the cause of his symptoms.  ED Course: Trp were negative. EKG was stable. Pulmonary perfusion scan was low risk. CXR was unremarkable. TRH was called for completion of chest pain rule out.    Review of Systems:  Denies palpitations, cough, syncopal episodes, sick contacts.  Review of systems is otherwise negative for all not mentioned in HPI.   Past Medical History:  Diagnosis Date  . Arthritis    lower back bulging disc, hips, knees, thumbs, shoulder  . Bladder cancer ([place]) UROLOGIST-  DR MCKENZIE   S/P TURBT 08-16-2017  . BPH (benign prostatic hyperplasia)   . ckd stage 3w    STAGE 3  B CKD per lov 06-14-2020 dr Rica Mast Jeri Cos kidney on chart  . Elevated PSA    RESOLVED SINCE 2018  . Fatigue    MIDDLE OF DAY ON OCCASION  . Frequency of urination   . GERD (gastroesophageal reflux disease)    on protonix  . Hematuria    RESOLVED  . History of colon polyps   . History of dysphagia resolved  . HLD (hyperlipidemia)   . HOH (hard of hearing)    slightly hoh  . HTN (hypertension)   . Hypothyroidism   . Incontinence of urine    only wears protection if he's planning to be in public for an extended amt of  time  . 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 Shoulder bursitis    undiagnosed. painful and limited ROM on left, RESOLVED  . Sleep apnea    uses cpap  . Weakness of extremity    left knee  . Wears glasses     Past Surgical History:  Procedure Laterality Date  . BCG X 2     AFTER LAST 2 BLADDER TUMOR REMOVALS  . COLONSCOPY  YRS AGO  . CYSTOSCOPY W/ URETERAL STENT PLACEMENT Bilateral 08/16/2017   Procedure: CYSTOSCOPY WITH RETROGRADE PYELOGRAM/URETERAL STENT PLACEMENT;  Surgeon: Cleon Gustin, MD;  Location: [facility];  Service: Urology;  Laterality: Bilateral;  . CYSTOSCOPY WITH BIOPSY N/A 06/25/2020   Procedure: CYSTOSCOPY FULGURATION   BLADDER  BIOPSY;  Surgeon: Robley Fries, MD;  Location[facility];  Service: Urology;  Laterality: N/A;  . CYSTOSCOPY WITH FULGERATION N/A 12/08/2019   Procedure: CYSTOSCOPY WITH FULGERATION;  Surgeon: Cleon Gustin, MD;  Location: [facility] ;  Service: Urology;  Laterality: N/A;  . KNEE ARTHROSCOPY Left 06/10/2011  . PROSTATE BIOPSY  2000  . TRANSURETHRAL RESECTION OF BLADDER TUMOR N/A 08/16/2017   Procedure: TRANSURETHRAL RESECTION OF BLADDER TUMOR (TURBT);  Surgeon: Cleon Gustin, MD;  Location: [facility];  Service: Urology;  Laterality: N/A;  . TRANSURETHRAL RESECTION OF BLADDER TUMOR  12-09-2012   dr Alona [facility]   and TURP  . TRANSURETHRAL RESECTION OF BLADDER TUMOR N/A 09/20/2017   Procedure: TRANSURETHRAL RESECTION OF BLADDER TUMOR (TURBT);  Surgeon: Cleon Gustin, MD;  Location: [facility];  Service: Urology;  Laterality: N/A;  . TRANSURETHRAL RESECTION OF BLADDER TUMOR N/A 12/08/2019   Procedure: TRANSURETHRAL RESECTION OF BLADDER TUMOR;  Surgeon: Cleon Gustin, MD;  Location: [facility];  Service: Urology;  Laterality: N/A;  1 HR  . TRANSURETHRAL RESECTION OF PROSTATE  2011  . UPPER GI ENDOSCOPY  YRS AGO     reports that he has never smoked. He has never used smokeless tobacco. He reports current alcohol use of about 1.0 - 2.0 standard drink of alcohol per week. He reports that he does not use drugs.  Allergies  Allergen Reactions  . Ciprofloxacin     REACTION: diarrhea.  Occurred with 750mg  dose.  Has since taken 500mg  doses and has had no reaction/diarrhea.  . Cephalexin Rash    Rash on arms    Family History  Problem Relation Age of Onset  . Hypertension Mother   . Breast cancer Mother   . Other Mother        brain tumor  . Alzheimer's disease Mother   . Emphysema Father        smoker    Prior to Admission medications   Medication Sig Start Date End Date Taking? Authorizing Provider  Ascorbic Acid (VITAMIN C PO) Take 1 tablet by mouth 2 (two) times daily.   Yes [provider]  aspirin 81 MG tablet Take 81 mg by mouth at bedtime.    Yes [provider]  aspirin-acetaminophen-caffeine (EXCEDRIN MIGRAINE) [phone] MG tablet Take 1 tablet by mouth every 6 (six) hours as needed for headache.   Yes [provider]  Cholecalciferol (VITAMIN D3 PO) Take 1 capsule by mouth daily.   Yes [provider]  irbesartan (AVAPRO) 300 MG tablet Take 300 mg by mouth at bedtime.  12/25/15  Yes [provider]  levothyroxine (SYNTHROID, LEVOTHROID) 50 MCG tablet Take 50 mcg by mouth daily before breakfast.  12/25/15  Yes [provider]  Multiple Vitamins-Minerals (ZINC PO) Take 1 tablet by mouth daily.   Yes [provider]  nitrofurantoin, macrocrystal-monohydrate, (MACROBID) 100 MG capsule Take 100 mg by mouth 2 (two) times daily. 7 day supply 07/05/20  Yes [provider]  pantoprazole (PROTONIX) 40 MG tablet Take 40 mg by mouth at bedtime.  12/20/15  Yes [provider]  psyllium  (METAMUCIL) 58.6 % powder Take 1 packet by mouth every evening. WITH MEDS   Yes [provider]  rosuvastatin (CRESTOR) 5 MG tablet Take 5 mg by mouth at bedtime.   Yes [provider]  silodosin (RAPAFLO) 8 MG CAPS capsule TAKE ONE CAPSULE BY MOUTH EVERY NIGHT AT BEDTIME Patient taking differently: Take 8 mg by mouth daily.  07/25/20  Yes McKenzie, Candee Furbish, MD  phenazopyridine (PYRIDIUM) 200 MG tablet Take 1 tablet (200 mg total) by mouth 3 (three) times daily as needed for pain. Patient not taking: Reported on 08/20/2020 06/25/20 06/25/21  Robley Fries, MD    Physical Exam: Vitals:   08/20/20 1132 08/20/20 1330 08/20/20 1545 08/20/20 1554  BP: 122/68 121/62  124/64  Pulse: 73 72  74 66  Resp: 12 16 17 18   Temp:      TempSrc:      SpO2: 100% 98% 100% 99%  Weight:      Height:        General: 86 y.o. male resting in bed in NAD Eyes: PERRL, normal sclera ENMT: Nares patent w/o discharge, orophaynx clear, dentition normal, ears w/o discharge/lesions/ulcers Neck: Supple, trachea midline Cardiovascular: RRR, +S1, S2, no m/g/r, equal pulses throughout Respiratory: CTABL, no w/r/r, normal WOB GI: BS+, NDNT, no masses noted, no organomegaly noted MSK: No e/c/c Skin: No rashes, bruises, ulcerations noted Neuro: A&O x 3, no focal deficits Psyc: Appropriate interaction and affect, calm/cooperative  Labs on Admission: I have personally reviewed following labs and imaging studies  CBC: Recent Labs  Lab 08/20/20 0713 08/20/20 1138  WBC 14.6*  --   NEUTROABS 13.6*  --   HGB 12.8* 12.6*  HCT 38.0* 37.0*  MCV 88.8  --   PLT 258  --    Basic Metabolic Panel: Recent Labs  Lab 08/20/20 0628 08/20/20 1138  NA 137 142  K 3.7 4.1  CL 105 109  CO2 19*  --   GLUCOSE 154* 129*  BUN 31* 30*  CREATININE 2.03* 2.00*  CALCIUM 9.3  --    GFR: Estimated Creatinine Clearance: 29.2 mL/min (A) (by C-G formula based on SCr of 2 mg/dL (H)). Liver Function Tests: No  results for input(s): AST, ALT, ALKPHOS, BILITOT, PROT, ALBUMIN in the last 168 hours. No results for input(s): LIPASE, AMYLASE in the last 168 hours. No results for input(s): AMMONIA in the last 168 hours. Coagulation Profile: No results for input(s): INR, PROTIME in the last 168 hours. Cardiac Enzymes: No results for input(s): CKTOTAL, CKMB, CKMBINDEX, TROPONINI in the last 168 hours. BNP (last 3 results) No results for input(s): PROBNP in the last 8760 hours. HbA1C: No results for input(s): HGBA1C in the last 72 hours. CBG: No results for input(s): GLUCAP in the last 168 hours. Lipid Profile: No results for input(s): CHOL, HDL, LDLCALC, TRIG, CHOLHDL, LDLDIRECT in the last 72 hours. Thyroid Function Tests: No results for input(s): TSH, T4TOTAL, FREET4, T3FREE, THYROIDAB in the last 72 hours. Anemia Panel: No results for input(s): VITAMINB12, FOLATE, FERRITIN, TIBC, IRON, RETICCTPCT in the last 72 hours. Urine analysis:    Component Value Date/Time   COLORURINE STRAW (A) 01/18/2020 0457   APPEARANCEUR CLEAR 01/18/2020 0457   LABSPEC 1.004 (L) 01/18/2020 0457   PHURINE 5.0 01/18/2020 0457   GLUCOSEU NEGATIVE 01/18/2020 0457   HGBUR LARGE (A) 01/18/2020 0457   BILIRUBINUR NEGATIVE 01/18/2020 [place] 01/18/2020 0457   PROTEINUR NEGATIVE 01/18/2020 0457   NITRITE NEGATIVE 01/18/2020 0457   LEUKOCYTESUR NEGATIVE 01/18/2020 0457    Radiological Exams on Admission: DG Chest 2 View  Result Date: 08/20/2020 CLINICAL DATA:  Shortness of breath.  Chest pain. EXAM: CHEST - 2 VIEW COMPARISON:  08/14/2018. FINDINGS: Mediastinum hilar structures normal. Mild left base subsegmental atelectasis. No pleural effusion or pneumothorax. Heart size normal. Degenerative change thoracic spine. IMPRESSION: Mild left base subsegmental atelectasis. Electronically Signed   By: Marcello Moores  Register   On: 08/20/2020 05:36   NM Pulmonary Perfusion  Result Date: 08/20/2020 CLINICAL DATA:   Suspected. EXAM: NUCLEAR MEDICINE PERFUSION LUNG SCAN TECHNIQUE: Perfusion images were obtained in multiple projections after intravenous injection of radiopharmaceutical. Ventilation scans intentionally deferred if perfusion scan and chest x-ray adequate for interpretation during COVID 19 epidemic. RADIOPHARMACEUTICALS:  3.8 mCi Tc-74m MAA  IV COMPARISON:  Chest x-ray 08/20/2020. FINDINGS: No prominent perfusion defects noted. Low probability pulmonary embolus. Given the high clinical suspicion for pulmonary embolus bilateral lower extremity color flow duplex Doppler can be obtained as needed. IMPRESSION: No prominent perfusion defects.  Low probability pulmonary embolus. Electronically Signed   By: Marcello Moores  Register   On: 08/20/2020 11:56    EKG: Independently reviewed. Sinus tach  Assessment/Plan Chest pain     - admit to obs, telemetry     - trp, EKG, CXR, lung perfusion all ok     - recent echo for surgical procedure ok     - no edema on exam     - can check BNP, but outside of a stress test or cath, not much else to do from medicine standpoint     - spoke with cards, they will see in AM  UTI     - can't access [facility]'s culture data     - spoke with on call urology; culture showed pansensitive enteroccus; spoke with pharmacy, will start cipro 500mg  qday given renal function     - will hold abx until that data is obtained     - UA, UCx also ordered     - place macrobid as an allergy  History of bladder cancer BPH     - follow up with urology     - rapaflo  CKD stage 3b     - he is at baseline     - watch nephrotoxins  HTN     - continue irbesartan  Hypothyroidism     - synthroid  HLD     - crestor  DVT prophylaxis: heparin  Code Status: DNI  Family Communication: None at bedside  Consults called: Cardiology  Admission status: Observation d/t concern for possible cardiac collapse and ongoing chest discomfort.  Status is: Observation  The patient remains OBS  appropriate and will d/c before 2 midnights.  Dispo: The patient is from: Home              Anticipated d/c is to: Home              Anticipated d/c date is: 1 day              Patient currently is not medically stable to d/c.  Jonnie Finner DO [facility]  If 7PM-7AM, please contact night-coverage [email or web address]  08/20/2020, 4:48 PM

## 2020-08-20 NOTE — ED Notes (Signed)
Pt ambulated unassisted with a steady gait at 99%-100% RA.

## 2020-08-20 NOTE — ED Triage Notes (Signed)
Patient brought from home by [facility]. Patient took new medication (Nitrofurantoin). Patient is having sob, lightheaded, burping, chest tightness. Patient states this started about 3 am.

## 2020-08-20 NOTE — ED Provider Notes (Signed)
[facility] DEPT Provider Note   CSN: [id number] Arrival date & time: 08/20/20  0426     History Chief Complaint  Patient presents with   Shortness of Breath   Chest Pain    [name] [name] is a 86 y.o. male.  HPI   86 year old male with history of arthritis, bladder cancer s/p TURBT, BPH, CKD, GERD, hyperlipidemia, hypertension, hypothyroidism, who presents to the emergency department today for evaluation of shortness of breath and chest pressure.  States he went to bed without issue but woke up around 3 AM feeling very short of breath.  He also reports chest pressure to the mid chest.  He had an intermittent episode of pain that radiated into the right shoulder.  He has some associated nausea and he did have an episode of diaphoresis which he describes as feeling feverish.  He denies any cough or pleuritic pain.  Denies any new bilateral lower extremity swelling or calf pain.  He has had no vomiting.  He is currently being treated for a UTI with nitrofurantoin and he took his first dose last night prior to going to bed.  He denies any angioedema, throat swelling or difficulty swallowing.  Denies any rashes.  Additionally, he recently had cystoscopy with biopsy 06/24/2020 for recurrent bladder papillary carcinoma.  Has been fully vaccinated against COVID.   Past Medical History:  Diagnosis Date   Arthritis    lower back bulging disc, hips, knees, thumbs, shoulder   Bladder cancer ([place]) UROLOGIST-  DR MCKENZIE   S/P TURBT 08-16-2017   BPH (benign prostatic hyperplasia)    ckd stage 3w    STAGE 3  B CKD per lov 06-14-2020 dr Rica Mast Jeri [facility] kidney on chart   Elevated PSA    RESOLVED SINCE 2018   Fatigue    MIDDLE OF DAY ON OCCASION   Frequency of urination    GERD (gastroesophageal reflux disease)    on protonix   Hematuria    RESOLVED   History of colon polyps    History of dysphagia resolved   HLD (hyperlipidemia)     HOH (hard of hearing)    slightly hoh   HTN (hypertension)    Hypothyroidism    Incontinence of urine    only wears protection if he's planning to be in public for an extended amt of time   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 painful and limited ROM on left, RESOLVED   Sleep apnea    uses cpap   Weakness of extremity    left knee   Wears glasses     Patient Active Problem List   Diagnosis Date Noted   Chest pain 08/20/2020   Essential hypertension 05/23/2020   Sleep apnea treated with nocturnal BiPAP 01/25/2020   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   Hypersomnia with sleep apnea 09/15/2019   Cardiac arrhythmia due to premature depolarization 09/15/2019   Excessive daytime sleepiness 09/15/2019   CKD (chronic kidney disease) stage 3, GFR 30-59 ml/min 09/15/2019   Malignant neoplasm of urinary bladder ([place]) 09/15/2019   Gross hematuria 09/11/2017    Past Surgical History:  Procedure Laterality Date   BCG X 2     AFTER LAST 2 BLADDER TUMOR REMOVALS   COLONSCOPY  YRS AGO   CYSTOSCOPY W/ URETERAL STENT PLACEMENT Bilateral 08/16/2017  Procedure: CYSTOSCOPY WITH RETROGRADE PYELOGRAM/URETERAL STENT PLACEMENT;  Surgeon: Cleon Gustin, MD;  Location: [facility];  Service: Urology;  Laterality: Bilateral;   CYSTOSCOPY WITH BIOPSY N/A 06/25/2020   Procedure: CYSTOSCOPY FULGURATION   BLADDER  BIOPSY;  Surgeon: Robley Fries, MD;  Location[facility];  Service: Urology;  Laterality: N/A;   CYSTOSCOPY WITH FULGERATION N/A 12/08/2019   Procedure: CYSTOSCOPY WITH FULGERATION;  Surgeon: Cleon Gustin, MD;  Location: [facility];  Service: Urology;  Laterality: N/A;   KNEE ARTHROSCOPY Left 06/10/2011   PROSTATE BIOPSY   2000   TRANSURETHRAL RESECTION OF BLADDER TUMOR N/A 08/16/2017   Procedure: TRANSURETHRAL RESECTION OF BLADDER TUMOR (TURBT);  Surgeon: Cleon Gustin, MD;  Location: [facility];  Service: Urology;  Laterality: N/A;   TRANSURETHRAL RESECTION OF BLADDER TUMOR  12-09-2012   dr Alona Bene [facility]   and TURP   TRANSURETHRAL RESECTION OF BLADDER TUMOR N/A 09/20/2017   Procedure: TRANSURETHRAL RESECTION OF BLADDER TUMOR (TURBT);  Surgeon: Cleon Gustin, MD;  Location: [facility];  Service: Urology;  Laterality: N/A;   TRANSURETHRAL RESECTION OF BLADDER TUMOR N/A 12/08/2019   Procedure: TRANSURETHRAL RESECTION OF BLADDER TUMOR;  Surgeon: Cleon Gustin, MD;  Location[facility];  Service: Urology;  Laterality: N/A;  1 HR   TRANSURETHRAL RESECTION OF PROSTATE  2011   UPPER GI ENDOSCOPY  YRS AGO       Family History  Problem Relation Age of Onset   Hypertension Mother    Breast cancer Mother    Other Mother        brain tumor   Alzheimer's disease Mother    Emphysema Father        smoker    Social History   Tobacco Use   Smoking status: Never Smoker   Smokeless tobacco: Never Used  Vaping Use   Vaping Use: Never used  Substance Use Topics   Alcohol use: Yes    Alcohol/week: 1.0 - 2.0 standard drink    Types: 1 - 2 Glasses of wine per week    Comment: occa wine   Drug use: No    Home Medications Prior to Admission medications   Medication Sig Start Date End Date Taking? Authorizing Provider  Ascorbic Acid (VITAMIN C PO) Take 1 tablet by mouth 2 (two) times daily.   Yes [provider]  aspirin 81 MG tablet Take 81 mg by mouth at bedtime.    Yes [provider]  aspirin-acetaminophen-caffeine (EXCEDRIN MIGRAINE) [phone] MG tablet Take 1 tablet by mouth every 6 (six) hours as needed for headache.   Yes [provider]  Cholecalciferol (VITAMIN D3 PO) Take 1 capsule by mouth  daily.   Yes [provider]  irbesartan (AVAPRO) 300 MG tablet Take 300 mg by mouth at bedtime.  12/25/15  Yes [provider]  levothyroxine (SYNTHROID, LEVOTHROID) 50 MCG tablet Take 50 mcg by mouth daily before breakfast.  12/25/15  Yes [provider]  Multiple Vitamins-Minerals (ZINC PO) Take 1 tablet by mouth daily.   Yes [provider]  nitrofurantoin, macrocrystal-monohydrate, (MACROBID) 100 MG capsule Take 100 mg by mouth 2 (two) times daily. 7 day supply 07/05/20  Yes [provider]  pantoprazole (PROTONIX) 40 MG tablet Take 40 mg by mouth at bedtime.  12/20/15  Yes [provider]  psyllium (METAMUCIL) 58.6 % powder Take 1 packet by mouth every evening. WITH MEDS  Yes [provider]  rosuvastatin (CRESTOR) 5 MG tablet Take 5 mg by mouth at bedtime.   Yes [provider]  silodosin (RAPAFLO) 8 MG CAPS capsule TAKE ONE CAPSULE BY MOUTH EVERY NIGHT AT BEDTIME Patient taking differently: Take 8 mg by mouth daily.  07/25/20  Yes McKenzie, Candee Furbish, MD  phenazopyridine (PYRIDIUM) 200 MG tablet Take 1 tablet (200 mg total) by mouth 3 (three) times daily as needed for pain. Patient not taking: Reported on 08/20/2020 06/25/20 06/25/21  Jacalyn Lefevre D, MD    Allergies    Ciprofloxacin and Cephalexin  Review of Systems   Review of Systems  Constitutional: Positive for diaphoresis. Negative for fever.  HENT: Negative for ear pain and sore throat.   Eyes: Negative for visual disturbance.  Respiratory: Positive for shortness of breath. Negative for cough.   Cardiovascular: Positive for chest pain. Negative for leg swelling.  Gastrointestinal: Positive for nausea. Negative for abdominal pain, constipation, diarrhea and vomiting.  Genitourinary: Negative for dysuria and hematuria.  Musculoskeletal: Negative for back pain.  Skin: Negative for rash.  Neurological: Negative for headaches.  All other systems reviewed and are  negative.   Physical Exam Updated Vital Signs BP 124/64    Pulse 66    Temp 98.6 F (37 C) (Oral)    Resp 18    Ht 5' 8.5" (1.74 m)    Wt 86.6 kg    SpO2 99%    BMI 28.62 kg/m   Physical Exam Vitals and nursing note reviewed.  Constitutional:      Appearance: He is well-developed.  HENT:     Head: Normocephalic and atraumatic.  Eyes:     Conjunctiva/sclera: Conjunctivae normal.  Cardiovascular:     Rate and Rhythm: Normal rate and regular rhythm.     Heart sounds: Normal heart sounds. No murmur heard.   Pulmonary:     Effort: No respiratory distress.     Breath sounds: Rales (faint LLL rales) present. No decreased breath sounds, wheezing or rhonchi.     Comments: Conversationally dyspneic Abdominal:     Palpations: Abdomen is soft.     Tenderness: There is no abdominal tenderness.  Musculoskeletal:     Cervical back: Neck supple.     Comments: Trace ble edema  Skin:    General: Skin is warm and dry.  Neurological:     Mental Status: He is alert.     ED Results / Procedures / Treatments   Labs (all labs ordered are listed, but only abnormal results are displayed) Labs Reviewed  BASIC METABOLIC PANEL - Abnormal; Notable for the following components:      Result Value   CO2 19 (*)    Glucose, Bld 154 (*)    BUN 31 (*)    Creatinine, Ser 2.03 (*)    GFR calc non Af Amer 29 (*)    GFR calc Af Amer 34 (*)    All other components within normal limits  D-DIMER, QUANTITATIVE (NOT AT [facility]) - Abnormal; Notable for the following components:   D-Dimer, Quant 4.36 (*)    All other components within normal limits  CBC WITH DIFFERENTIAL/PLATELET - Abnormal; Notable for the following components:   WBC 14.6 (*)    Hemoglobin 12.8 (*)    HCT 38.0 (*)    Neutro Abs 13.6 (*)    Lymphs Abs 0.3 (*)    All other components within normal limits  I-STAT CHEM 8, ED - Abnormal; Notable for the following  components:   BUN 30 (*)    Creatinine, Ser 2.00 (*)    Glucose, Bld 129 (*)     Hemoglobin 12.6 (*)    HCT 37.0 (*)    All other components within normal limits  RESPIRATORY PANEL BY RT PCR (FLU A&B, COVID)  CBC WITH DIFFERENTIAL/PLATELET  CBC  TROPONIN I (HIGH SENSITIVITY)  TROPONIN I (HIGH SENSITIVITY)    EKG EKG Interpretation  Date/Time:  Tuesday August 20 2020 05:03:05 EDT Ventricular Rate:  105 PR Interval:    QRS Duration: 93 QT Interval:  323 QTC Calculation: 427 R Axis:   53 Text Interpretation: Sinus tachycardia Minimal ST depression, anterolateral leads 12 Lead; [place] Confirmed by Thayer Jew [id number]) on 08/20/2020 6:09:33 AM   Radiology DG Chest 2 View  Result Date: 08/20/2020 CLINICAL DATA:  Shortness of breath.  Chest pain. EXAM: CHEST - 2 VIEW COMPARISON:  08/14/2018. FINDINGS: Mediastinum hilar structures normal. Mild left base subsegmental atelectasis. No pleural effusion or pneumothorax. Heart size normal. Degenerative change thoracic spine. IMPRESSION: Mild left base subsegmental atelectasis. Electronically Signed   By: Marcello Moores  Register   On: 08/20/2020 05:36   NM Pulmonary Perfusion  Result Date: 08/20/2020 CLINICAL DATA:  Suspected. EXAM: NUCLEAR MEDICINE PERFUSION LUNG SCAN TECHNIQUE: Perfusion images were obtained in multiple projections after intravenous injection of radiopharmaceutical. Ventilation scans intentionally deferred if perfusion scan and chest x-ray adequate for interpretation during COVID 19 epidemic. RADIOPHARMACEUTICALS:  3.8 mCi Tc-62m MAA IV COMPARISON:  Chest x-ray 08/20/2020. FINDINGS: No prominent perfusion defects noted. Low probability pulmonary embolus. Given the high clinical suspicion for pulmonary embolus bilateral lower extremity color flow duplex Doppler can be obtained as needed. IMPRESSION: No prominent perfusion defects.  Low probability pulmonary embolus. Electronically Signed   By: Marcello Moores  Register   On: 08/20/2020 11:56    Procedures Procedures (including critical care time)  Medications  Ordered in ED Medications  sodium chloride 0.9 % bolus 1,000 mL (0 mLs Intravenous Stopped 08/20/20 1303)  technetium albumin aggregated (MAA) injection solution 3.8 millicurie (3.8 millicuries Intravenous Contrast Given 08/20/20 1100)    ED Course  I have reviewed the triage vital signs and the nursing notes.  Pertinent labs & imaging results that were available during my care of the patient were reviewed by me and considered in my medical decision making (see chart for details).    MDM Rules/Calculators/A&P                          86 year old male presenting for evaluation of chest pain and shortness of breath that started this morning.  Initially marginally tachycardic but satting well on room air.  Remainder vital signs reassuring.  Reviewed/interpreted by CBC with mild leukocytosis, and mild anemia BMP with low bicarb, elevated bun/cr at baseline for him Troponin initially negative, delta neg D-dimer positive Covid negative  EKG - Sinus tachycardia Minimal ST depression, anterolateral leads  Chest x-ray reviewed/interpreted -  Mild left base subsegmental atelectasis.   NM perfusion scan - No prominent perfusion defects.  Low probability pulmonary embolus.   Pt seen in conjunction with supervising physician Dr. Laverta Baltimore. In agreement with admitting patient for ACS r/o.  CONSULT with Dr. Marylyn Ishihara who accepts patient for admission.   Care transitioned to Domenic Moras pending cards consult  Final Clinical Impression(s) / ED Diagnoses Final diagnoses:  Chest pain, unspecified type    Rx / DC Orders ED Discharge Orders    None  Bishop Dublin 08/20/20 1617    Margette Fast, MD 08/21/20 [phone]

## 2020-08-20 NOTE — Progress Notes (Signed)
Urine Culture from [facility] on 08/15/20   Culture, Urine -   N  Notes: Performed By: [facility], [address], [place], [place], [address]; Lab Dir: Vedia Pereyra, [phone]  >100,000 C/ML GRAM POSITIVE COCCI; ID AND SENSITIVITY TO FOLLOW   ROrganism 1 -   N  Notes: Performed By: [facility]	[place], [facility], [address] [place], [place], [place], [address]; Lab Dir: Vedia Pereyra, [phone]  .  Antimicrobial Susceptibility and Organism Identification Report * FINAL * Source     : URINE CULTURE  Iso/Result : 01  >100,000 C/ML   Enterococcus faecalis  Antimicrobic/Dose                        MIC   SYSTEMIC   URINE  ---------------------------------------------------------------  Ampicillin                                <2       S        S  Ciprofloxacin                             <1       S        S  Daptomycin                                 2       S        S  Nitrofurantoin                           <32                S  Gent. Synergy                           <500       S        S  Levofloxacin                              <1       S        S  Linezolid                                  2       S        S  Penicillin                                 2       S        S  Strep. Synergy                         <1000       S        S  Tetracycline                              <4  S        S  Vancomycin                                 2       S        S

## 2020-08-20 NOTE — ED Notes (Signed)
This RN tried to call report to floor RN, floor RN unable to take report at this time. Will call back for room 1431.

## 2020-08-21 ENCOUNTER — Observation Stay (HOSPITAL_COMMUNITY): Payer: Medicare Other

## 2020-08-21 DIAGNOSIS — I10 Essential (primary) hypertension: Secondary | ICD-10-CM | POA: Diagnosis not present

## 2020-08-21 DIAGNOSIS — N1832 Chronic kidney disease, stage 3b: Secondary | ICD-10-CM | POA: Diagnosis not present

## 2020-08-21 DIAGNOSIS — R7989 Other specified abnormal findings of blood chemistry: Secondary | ICD-10-CM

## 2020-08-21 DIAGNOSIS — N3001 Acute cystitis with hematuria: Secondary | ICD-10-CM | POA: Diagnosis not present

## 2020-08-21 DIAGNOSIS — R079 Chest pain, unspecified: Secondary | ICD-10-CM

## 2020-08-21 DIAGNOSIS — N390 Urinary tract infection, site not specified: Secondary | ICD-10-CM | POA: Diagnosis not present

## 2020-08-21 DIAGNOSIS — E7800 Pure hypercholesterolemia, unspecified: Secondary | ICD-10-CM

## 2020-08-21 DIAGNOSIS — Z8551 Personal history of malignant neoplasm of bladder: Secondary | ICD-10-CM

## 2020-08-21 DIAGNOSIS — D631 Anemia in chronic kidney disease: Secondary | ICD-10-CM

## 2020-08-21 DIAGNOSIS — N1330 Unspecified hydronephrosis: Secondary | ICD-10-CM | POA: Diagnosis not present

## 2020-08-21 DIAGNOSIS — N401 Enlarged prostate with lower urinary tract symptoms: Secondary | ICD-10-CM | POA: Diagnosis not present

## 2020-08-21 LAB — LIPASE, BLOOD: Lipase: 33 U/L (ref 11–51)

## 2020-08-21 LAB — HEMOGLOBIN A1C
Hgb A1c MFr Bld: 6.2 % — ABNORMAL HIGH (ref 4.8–5.6)
Mean Plasma Glucose: 131.24 mg/dL

## 2020-08-21 LAB — AMYLASE: Amylase: 86 U/L (ref 28–100)

## 2020-08-21 MED ORDER — AMOXICILLIN 250 MG PO CAPS
500.0000 mg | ORAL_CAPSULE | Freq: Two times a day (BID) | ORAL | Status: DC
  Administered 2020-08-21 – 2020-08-23 (×5): 500 mg via ORAL
  Filled 2020-08-21 (×5): qty 2

## 2020-08-21 MED ORDER — MAGNESIUM CITRATE PO SOLN: 1.0000 | Freq: Once | ORAL | Status: DC

## 2020-08-21 NOTE — Consult Note (Addendum)
Cardiology Consultation:   Patient ID: [name] [name] MRN: [id number]; DOB: [birth year]  Admit date: 08/20/2020 Date of Consult: 08/21/2020  Primary Care Provider: [name], MD [facility] Cardiologist: Buford Dresser, MD  [place] Electrophysiologist:  None    Patient Profile:   [name] [name] is a 86 y.o. male with a hx of sleep apnea on Bipap, bladder cancer, HTN, HLD, and CKD stage 3 who is being seen today for the evaluation of chest pain at the request of Dr. Cyndia Skeeters.  History of Present Illness:   [name] [name] was seen by Dr. Harrell Gave in July 2021 for pre-op clearance for cystoscopy. He has family history of cardiac disease. Also has RF for CAD including HTN and HLD. Patient has no history of MI, cath, or stent. He had an echo 05/03/20 that showed LVEF 55-60%, no WMA, mild LVH, normal RV function, mildly dilated LA, trivial MR, mild AI, mild dilation of ascending aorta 5mm. He had an ETT showing elevated BP with exercise but no ischemia. Overall felt he needed better BP control. He was not having chest pain at that time.   The patient presented to [facility] ED 08/20/20 for shortness of breath and chest pressure. He woke up at [facility] with shortness of breath, chest pressure, generalized shaking and a headache. CP was substernal and radiating to the right shoulder. Also had some mild nausea and diaphoresis. He said the pain was very mild 2/10 and pleuritic in nature. Not worse with exertion. His breathing was very short and fast. He said he was recently started on a new abx (nitrofurantine) for UTI and had taken the first dose at 7pm before bed. He felt his symptoms might have been from an allergic reaction. No swelling noted or trouble breathing. Ems was called who brought him to the ED for further evaluation. He had a cystoscopy with biopsy 06/24/20 for recurrent bladder papillary carcinoma and was planning on starting bcg treatment however UTI deferred this.    In the ED BP 124/64. Pulse 66. RR 18, afebrile. Labs showed glucose 154, creatinine 2.03, BUN 31, WBC 14.6, Hgb 12.8. D-dimer 4.36. Hs troponin 7>9. BNP 87. EKG showed ST with minimal ST depression lateral leads. CXR showed mild left base subsegmental atelectasis. Pulmonary perfusion showed showed no prominent perfusion defects, low probability embolus. Patient was admitted for further work-up.   Past Medical History:  Diagnosis Date  . Arthritis    lower back bulging disc, hips, knees, thumbs, shoulder  . Bladder cancer ([place]) UROLOGIST-  DR MCKENZIE   S/P TURBT 08-16-2017  . BPH (benign prostatic hyperplasia)   . ckd stage 3w    STAGE 3  B CKD per lov 06-14-2020 dr Rica Mast Jeri Cos kidney on chart  . Elevated PSA    RESOLVED SINCE 2018  . Fatigue    MIDDLE OF DAY ON OCCASION  . Frequency of urination   . GERD (gastroesophageal reflux disease)    on protonix  . Hematuria    RESOLVED  . History of colon polyps   . History of dysphagia resolved  . HLD (hyperlipidemia)   . HOH (hard of hearing)    slightly hoh  . HTN (hypertension)   . Hypothyroidism   . Incontinence of urine    only wears protection if he's planning to be in public for an extended amt of time  . 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 Shoulder bursitis    undiagnosed. painful and limited ROM on left, RESOLVED  . Sleep apnea    uses cpap  . Weakness of extremity    left knee  . Wears glasses     Past Surgical History:  Procedure Laterality Date  . BCG X 2     AFTER LAST 2 BLADDER TUMOR REMOVALS  . COLONSCOPY  YRS AGO  . CYSTOSCOPY W/ URETERAL STENT PLACEMENT Bilateral 08/16/2017   Procedure: CYSTOSCOPY WITH RETROGRADE PYELOGRAM/URETERAL STENT PLACEMENT;  Surgeon: Cleon Gustin, MD;  Location: [facility];  Service: Urology;  Laterality:  Bilateral;  . CYSTOSCOPY WITH BIOPSY N/A 06/25/2020   Procedure: CYSTOSCOPY FULGURATION   BLADDER  BIOPSY;  Surgeon: Robley Fries, MD;  Location: [facility];  Service: Urology;  Laterality: N/A;  . CYSTOSCOPY WITH FULGERATION N/A 12/08/2019   Procedure: CYSTOSCOPY WITH FULGERATION;  Surgeon: Cleon Gustin, MD;  Location: [facility];  Service: Urology;  Laterality: N/A;  . KNEE ARTHROSCOPY Left 06/10/2011  . PROSTATE BIOPSY  2000  . TRANSURETHRAL RESECTION OF BLADDER TUMOR N/A 08/16/2017   Procedure: TRANSURETHRAL RESECTION OF BLADDER TUMOR (TURBT);  Surgeon: Cleon Gustin, MD;  Location: [facility];  Service: Urology;  Laterality: N/A;  . TRANSURETHRAL RESECTION OF BLADDER TUMOR  12-09-2012   dr Alona Bene [facility]   and TURP  . TRANSURETHRAL RESECTION OF BLADDER TUMOR N/A 09/20/2017   Procedure: TRANSURETHRAL RESECTION OF BLADDER TUMOR (TURBT);  Surgeon: Cleon Gustin, MD;  Location: [facility];  Service: Urology;  Laterality: N/A;  . TRANSURETHRAL RESECTION OF BLADDER TUMOR N/A 12/08/2019   Procedure: TRANSURETHRAL RESECTION OF BLADDER TUMOR;  Surgeon: Cleon Gustin, MD;  Location: [facility];  Service: Urology;  Laterality: N/A;  1 HR  . TRANSURETHRAL RESECTION OF PROSTATE  2011  . UPPER GI ENDOSCOPY  YRS AGO     Home Medications:  Prior to Admission medications   Medication Sig Start Date End Date Taking? Authorizing Provider  Ascorbic Acid (VITAMIN C PO) Take 1 tablet by mouth 2 (two) times daily.   Yes [provider]  aspirin 81 MG tablet Take 81 mg by mouth at bedtime.    Yes [provider]  aspirin-acetaminophen-caffeine (EXCEDRIN MIGRAINE) [phone] MG tablet Take 1 tablet by mouth every 6 (six) hours as needed for headache.   Yes [provider]  Cholecalciferol (VITAMIN D3 PO) Take 1 capsule by mouth daily.   Yes [provider]  irbesartan  (AVAPRO) 300 MG tablet Take 300 mg by mouth at bedtime.  12/25/15  Yes [provider]  levothyroxine (SYNTHROID, LEVOTHROID) 50 MCG tablet Take 50 mcg by mouth daily before breakfast.  12/25/15  Yes [provider]  Multiple Vitamins-Minerals (ZINC PO) Take 1 tablet by mouth daily.   Yes [provider]  pantoprazole (PROTONIX) 40 MG tablet Take 40 mg by mouth at bedtime.  12/20/15  Yes [provider]  psyllium (METAMUCIL) 58.6 % powder Take 1 packet by mouth every evening. WITH MEDS   Yes [provider]  rosuvastatin (CRESTOR) 5 MG tablet Take 5 mg by mouth at bedtime.   Yes [provider]  silodosin (RAPAFLO) 8 MG CAPS capsule TAKE ONE CAPSULE BY MOUTH EVERY NIGHT AT BEDTIME Patient taking differently: Take 8 mg by mouth daily.  07/25/20  Yes McKenzie, Candee Furbish, MD  phenazopyridine (PYRIDIUM) 200 MG tablet Take  1 tablet (200 mg total) by mouth 3 (three) times daily as needed for pain. Patient not taking: Reported on 08/20/2020 06/25/20 06/25/21  Robley Fries, MD    Inpatient Medications: Scheduled Meds: . aspirin EC  81 mg Oral QHS  . ciprofloxacin  500 mg Oral Daily  . enoxaparin (LOVENOX) injection  30 mg Subcutaneous QHS  . irbesartan  300 mg Oral QHS  . levothyroxine  50 mcg Oral Q0600  . pantoprazole  40 mg Oral QHS  . psyllium  1 packet Oral QHS  . rosuvastatin  5 mg Oral QHS  . tamsulosin  0.4 mg Oral Daily   Continuous Infusions:  PRN Meds: acetaminophen, ondansetron (ZOFRAN) IV  Allergies:    Allergies  Allergen Reactions  . Macrobid [Nitrofurantoin] Shortness Of Breath  . Ciprofloxacin     REACTION: diarrhea.  Occurred with 750mg  dose.  Has since taken 500mg  doses and has had no reaction/diarrhea.  . Cephalexin Rash    Rash on arms    Social History:   Social History   Socioeconomic History  . Marital status: Widowed    Spouse name: Not on file  . Number of children: 3  . Years of education: Not on file  .  Highest education level: Not on file  Occupational History  . Occupation: retired  Tobacco Use  . Smoking status: Never Smoker  . Smokeless tobacco: Never Used  Vaping Use  . Vaping Use: Never used  Substance and Sexual Activity  . Alcohol use: Yes    Alcohol/week: 1.0 - 2.0 standard drink    Types: 1 - 2 Glasses of wine per week    Comment: occa wine  . Drug use: No  . Sexual activity: Not on file  Other Topics Concern  . Not on file  Social History Narrative  . Not on file   Social Determinants of Health   Financial Resource Strain:   . Difficulty of Paying Living Expenses: Not on file  Food Insecurity:   . Worried About Charity fundraiser in the Last Year: Not on file  . Ran Out of Food in the Last Year: Not on file  Transportation Needs:   . Lack of Transportation (Medical): Not on file  . Lack of Transportation (Non-Medical): Not on file  Physical Activity:   . Days of Exercise per Week: Not on file  . Minutes of Exercise per Session: Not on file  Stress:   . Feeling of Stress : Not on file  Social Connections:   . Frequency of Communication with Friends and Family: Not on file  . Frequency of Social Gatherings with Friends and Family: Not on file  . Attends Religious Services: Not on file  . Active Member of Clubs or Organizations: Not on file  . Attends Archivist Meetings: Not on file  . Marital Status: Not on file  Intimate Partner Violence:   . Fear of Current or Ex-Partner: Not on file  . Emotionally Abused: Not on file  . Physically Abused: Not on file  . Sexually Abused: Not on file    Family History:   Family History  Problem Relation Age of Onset  . Hypertension Mother   . Breast cancer Mother   . Other Mother        brain tumor  . Alzheimer's disease Mother   . Emphysema Father        smoker     ROS:  Please see the history of present  illness.  All other ROS reviewed and negative.     Physical Exam/Data:   Vitals:    08/20/20 1851 08/20/20 2147 08/21/20 0157 08/21/20 0559  BP: 140/60 (!) 107/59 (!) 91/46 (!) 110/55  Pulse: 73 71 (!) 59 65  Resp: 14 17 17 16   Temp: 98.4 F (36.9 C) 98.1 F (36.7 C) 98.2 F (36.8 C) 97.9 F (36.6 C)  TempSrc:      SpO2: 100% 98% 97% 95%  Weight:      Height:        Intake/Output Summary (Last 24 hours) at 08/21/2020 0913 Last data filed at 08/20/2020 2312 Gross per 24 hour  Intake 1240 ml  Output 300 ml  Net 940 ml   Last 3 Weights 08/20/2020 06/25/2020 06/19/2020  Weight (lbs) 191 lb 193 lb 12.8 oz 191 lb  Weight (kg) 86.637 kg 87.907 kg 86.637 kg     Body mass index is 28.62 kg/m.  General:  Well nourished, well developed, in no acute distress HEENT: normal Lymph: no adenopathy Neck: no JVD Endocrine:  No thryomegaly Vascular: No carotid bruits; FA pulses 2+ bilaterally without bruits  Cardiac:  normal S1, S2; RRR; no murmur  Lungs:  clear to auscultation bilaterally, no wheezing, rhonchi or rales  Abd: soft, nontender, no hepatomegaly  Ext: no edema Musculoskeletal:  No deformities, BUE and BLE strength normal and equal Skin: warm and dry  Neuro:  CNs 2-12 intact, no focal abnormalities noted Psych:  Normal affect   EKG:  The EKG was personally reviewed and demonstrates:  ST, 105bpm, minimal ST depression inferior leads Telemetry:  Telemetry was personally reviewed and demonstrates:  SB, Hr 50s, brief first degree AV block  Relevant CV Studies:  Echo 05/03/20 1. Left ventricular ejection fraction, by estimation, is 55 to 60%. The  left ventricle has normal function. The left ventricle has no regional  wall motion abnormalities. There is mild concentric left ventricular  hypertrophy. Left ventricular diastolic  parameters were normal.  2. Right ventricular systolic function is normal. The right ventricular  size is normal.  3. Left atrial size was mildly dilated.  4. The mitral valve is normal in structure. Trivial mitral valve   regurgitation. No evidence of mitral stenosis.  5. The aortic valve is tricuspid. Aortic valve regurgitation is mild.  Mild aortic valve sclerosis is present, with no evidence of aortic valve  stenosis.  6. There is mild dilatation of the ascending aorta measuring 39 mm.  7. The inferior vena cava is normal in size with greater than 50%  respiratory variability, suggesting right atrial pressure of 3 mmHg.   ETT 05/24/20  Blood pressure demonstrated a hypertensive response to exercise.  Upsloping ST segment depression ST segment depression was noted during stress in the III, II, aVF, V6, V5 and V4 leads.  Negative, adequate stress test.    Laboratory Data:  High Sensitivity Troponin:   Recent Labs  Lab 08/20/20 0628 08/20/20 1232  TROPONINIHS 7 9     Chemistry Recent Labs  Lab 08/20/20 0628 08/20/20 1138  NA 137 142  K 3.7 4.1  CL 105 109  CO2 19*  --   GLUCOSE 154* 129*  BUN 31* 30*  CREATININE 2.03* 2.00*  CALCIUM 9.3  --   GFRNONAA 29*  --   GFRAA 34*  --   ANIONGAP 13  --     No results for input(s): PROT, ALBUMIN, AST, ALT, ALKPHOS, BILITOT in the last 168 hours. Hematology Recent Labs  Lab 08/20/20 0713 08/20/20 1138  WBC 14.6*  --   RBC 4.28  --   HGB 12.8* 12.6*  HCT 38.0* 37.0*  MCV 88.8  --   MCH 29.9  --   MCHC 33.7  --   RDW 14.6  --   PLT 258  --    BNP Recent Labs  Lab 08/20/20 1908  BNP 87.5    DDimer  Recent Labs  Lab 08/20/20 0644  DDIMER 4.36*     Radiology/Studies:  DG Chest 2 View  Result Date: 08/20/2020 CLINICAL DATA:  Shortness of breath.  Chest pain. EXAM: CHEST - 2 VIEW COMPARISON:  08/14/2018. FINDINGS: Mediastinum hilar structures normal. Mild left base subsegmental atelectasis. No pleural effusion or pneumothorax. Heart size normal. Degenerative change thoracic spine. IMPRESSION: Mild left base subsegmental atelectasis. Electronically Signed   By: Marcello Moores  Register   On: 08/20/2020 05:36   NM Pulmonary  Perfusion  Result Date: 08/20/2020 CLINICAL DATA:  Suspected. EXAM: NUCLEAR MEDICINE PERFUSION LUNG SCAN TECHNIQUE: Perfusion images were obtained in multiple projections after intravenous injection of radiopharmaceutical. Ventilation scans intentionally deferred if perfusion scan and chest x-ray adequate for interpretation during COVID 19 epidemic. RADIOPHARMACEUTICALS:  3.8 mCi Tc-67m MAA IV COMPARISON:  Chest x-ray 08/20/2020. FINDINGS: No prominent perfusion defects noted. Low probability pulmonary embolus. Given the high clinical suspicion for pulmonary embolus bilateral lower extremity color flow duplex Doppler can be obtained as needed. IMPRESSION: No prominent perfusion defects.  Low probability pulmonary embolus. Electronically Signed   By: Marcello Moores  Register   On: 08/20/2020 11:56   {    Assessment and Plan:   Atypical Chest pain/SOB - presents with sudden onset sob and pleuritic CP. Non exertional. EKG with ST and some minimal ST depression lateral leads. HS troponin negative x 2. Creatinine elevated to 2. Normal BNP.  D-dimer elevated but perfusion scan with low probability.  - Patient felt symptoms were possibly an allergic rxn to nitrofurantoin.  - Recent echo showed good pump function - ETT in July 2021 showed no ischemia, just elevated Bps in response to stress - Continue Aspirin and statin - will check A1C - Would be a good candidate for cardiac CT however has CKD. Can consider limited echo to check for wall motion abnormality however low suspicion for ACS given negative trop. Will discuss plan with MD. Suspect we will continue with medical management.   HTN - continue Irbesartan 300 mg daily - BP this AM 110/55  HLD - continue crestor - LDL 63 07/2020  CKD stage 3 - creatinine around 2, which is baseline  UTI/hisotry of bladder cacner - Possible allergic rxn to nitrofurantoin - now on Cipro - per IM/Urology  For questions or updates, please contact [facility] Please consult [email or web address] for contact info under    Signed, Hill Mackie Ninfa Meeker, PA-C  08/21/2020 9:13 AM

## 2020-08-21 NOTE — Progress Notes (Signed)
   08/21/20 1400  Medical Necessity for Transport Certificate --- IF THIS TRANSPORT IS ROUND TRIP OR SCHEDULED AND REPEATED, A PHYSICIAN MUST COMPLETE THIS FORM  Transport from: (Location) [facility]  Transport to (Location) [facility]  Did the patient arrive from a [facility] or [facility]? No  Is this the closest appropriate facility? Yes  Date of Transport Service 08/22/20  Name of [facility]  Round Trip Transport? Yes  Reason for Transport Procedure  Is this a hospital to hospital transfer? Yes  Specific Services Available at [facility] Other (Comment) (nuclear medicine)  Is this a hospice patient? No  Describe the Medical Condition Chest pain  Q1 Are ALL the following "true"? 1. Patient unable to get up from bed without assistance  AND  2. Unable to ambulate  AND  3. Unable to sit in a chair, including wheelchair. Yes  Q2 Could the patient be transported safely by other means of transportation (I.E., wheelchair van)? No  Q3 Please check any of the following conditions that apply at the time of transport: Requires cardiac monitoring  Electronic Signature Berton Mount  Credentials RN  Date Signed 08/21/20  Print Form Print

## 2020-08-21 NOTE — TOC Progression Note (Signed)
Transition of Care [facility]) - Progression Note    Patient Details  Name: [name] [name] MRN: [id number] Date of Birth: December 16, 1931  Transition of Care [facility]) CM/SW Contact  Purcell Mouton, RN Phone Number: 08/21/2020, 12:05 PM  Clinical Narrative:     Pt will discharge home. PT to eval. TOC will continue to follow.   Expected Discharge Plan: Home/Self Care Barriers to Discharge: No Barriers Identified  Expected Discharge Plan and Services Expected Discharge Plan: Home/Self Care       Living arrangements for the past 2 months: Single Family Home                                       Social Determinants of Health (SDOH) Interventions    Readmission Risk Interventions No flowsheet data found.

## 2020-08-21 NOTE — Progress Notes (Signed)
PROGRESS NOTE  [name] [name] UKG:[id number] DOB: [birth year]   PCP: [name], MD  Patient is from: Home  DOA: 08/20/2020 LOS: 0  Brief Narrative / Interim history: 86 year old male with history of recurrent bladder papillary cancer s/p TURBT in 2018, and BPH followed by urology, recent UTI started on Macrobid, CKD-3B, HTN and hypothyroidism presenting with chest discomfort with associated diaphoresis, nausea and shortness of breath that woke him up from sleep.   Of note, he was recently started on Macrobid for UTI by his urologist.  In ED, hemodynamically stable.  EKG, serial troponin, BNP and CXR without significant finding.  D-dimer elevated to 4.26.  Pulmonary perfusion scan without significant finding.  CTA was not done due to CKD.  He was admitted for chest pain to rule out ACS.  Cardiology consulted.  Plan is for [facility] on 9/30.  Subjective: Seen and examined earlier this morning.  No major events overnight of this morning.  Reports improvement in his chest discomfort but did not resolve completely.   Objective: Vitals:   08/20/20 2147 08/21/20 0157 08/21/20 0559 08/21/20 1240  BP: (!) 107/59 (!) 91/46 (!) 110/55 107/61  Pulse: 71 (!) 59 65 70  Resp: 17 17 16 18   Temp: 98.1 F (36.7 C) 98.2 F (36.8 C) 97.9 F (36.6 C) 97.7 F (36.5 C)  TempSrc:    Oral  SpO2: 98% 97% 95% 96%  Weight:      Height:        Intake/Output Summary (Last 24 hours) at 08/21/2020 1554 Last data filed at 08/21/2020 1542 Gross per 24 hour  Intake 720 ml  Output 300 ml  Net 420 ml   Filed Weights   08/20/20 0444  Weight: 86.6 kg    Examination:  GENERAL: No apparent distress.  Nontoxic. HEENT: MMM.  Vision and hearing grossly intact.  NECK: Supple.  No apparent JVD.  RESP:  No IWOB.  Fair aeration bilaterally. CVS:  RRR. Heart sounds normal.  ABD/GI/GU: BS+. Abd soft, NTND.  MSK/EXT:  Moves extremities. No apparent deformity. No edema.  SKIN: no apparent  skin lesion or wound NEURO: Awake, alert and oriented appropriately.  No apparent focal neuro deficit. PSYCH: Calm. Normal affect.   Procedures:  None  Microbiology summarized: [id number]-15 and influenza PCR negative. Urine culture pending.  Assessment & Plan: Chest pain: association with dyspnea, diaphoresis and nausea concerning but he describes his pain as sharp and not exertional.  Serial troponin, EKG, BNP and CXR without significant finding.  Elevated D-dimer but perfusion scan low risk.  Chest pain improved this morning.  A1c 6.2%.  LDL 63. -Lexiscan in the morning per cardiology -Continue statin and aspirin -N.p.o. after midnight  Enterococcus faecalis UTI-urine culture at urology office with pansensitive Enterococcus. -Change antibiotic to ampicillin.  Per pharmacy, he tolerated penicillin in the past. -Follow repeat urine culture he  Elevated D-dimer: CTA deferred due to CKD-3B.  Perfusion scan low risk. -Check lower extremity Doppler  History of bladder cancer s/p TURBT BPH -Followed by urology. 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 -Avoid nephrotoxic meds -Continue monitoring  Essential hypertension: Normotensive -Continue Avapro  Hypothyroidism -Continue home Synthroid   Body mass index is 28.62 kg/m.         DVT prophylaxis:  enoxaparin (LOVENOX) injection 30 mg Start: 08/20/20 2200  Code Status: Partial code-everything except intubation Family Communication: Patient and/or RN. Available if any question. Status is: Observation  The patient remains OBS appropriate and will  d/c before 2 midnights.  Dispo: The patient is from: Home              Anticipated d/c is to: Home              Anticipated d/c date is: 1 day              Patient currently is not medically stable to d/c.       Consultants:  Cardiology   Sch Meds:  Scheduled Meds: . amoxicillin  500 mg Oral BID  . aspirin EC  81 mg Oral QHS  . enoxaparin (LOVENOX)  injection  30 mg Subcutaneous QHS  . irbesartan  300 mg Oral QHS  . levothyroxine  50 mcg Oral Q0600  . pantoprazole  40 mg Oral QHS  . psyllium  1 packet Oral QHS  . rosuvastatin  5 mg Oral QHS  . tamsulosin  0.4 mg Oral Daily   Continuous Infusions: PRN Meds:.acetaminophen, ondansetron (ZOFRAN) IV  Antimicrobials: Anti-infectives (From admission, onward)   Start     Dose/Rate Route Frequency Ordered Stop   08/21/20 1300  amoxicillin (AMOXIL) capsule 500 mg        500 mg Oral 2 times daily 08/21/20 1059     08/20/20 1845  ciprofloxacin (CIPRO) tablet 500 mg  Status:  Discontinued        500 mg Oral Daily 08/20/20 1834 08/21/20 1059       I have personally reviewed the following labs and images: CBC: Recent Labs  Lab 08/20/20 0713 08/20/20 1138  WBC 14.6*  --   NEUTROABS 13.6*  --   HGB 12.8* 12.6*  HCT 38.0* 37.0*  MCV 88.8  --   PLT 258  --    BMP &GFR Recent Labs  Lab 08/20/20 0628 08/20/20 1138  NA 137 142  K 3.7 4.1  CL 105 109  CO2 19*  --   GLUCOSE 154* 129*  BUN 31* 30*  CREATININE 2.03* 2.00*  CALCIUM 9.3  --    Estimated Creatinine Clearance: 29.2 mL/min (A) (by C-G formula based on SCr of 2 mg/dL (H)). Liver & Pancreas: No results for input(s): AST, ALT, ALKPHOS, BILITOT, PROT, ALBUMIN in the last 168 hours. Recent Labs  Lab 08/21/20 1233  LIPASE 33  AMYLASE 86   No results for input(s): AMMONIA in the last 168 hours. Diabetic: Recent Labs    08/20/20 0713  HGBA1C 6.2*   No results for input(s): GLUCAP in the last 168 hours. Cardiac Enzymes: No results for input(s): CKTOTAL, CKMB, CKMBINDEX, TROPONINI in the last 168 hours. No results for input(s): PROBNP in the last 8760 hours. Coagulation Profile: No results for input(s): INR, PROTIME in the last 168 hours. Thyroid Function Tests: No results for input(s): TSH, T4TOTAL, FREET4, T3FREE, THYROIDAB in the last 72 hours. Lipid Profile: Recent Labs    08/20/20 1908  CHOL 117  HDL 43   LDLCALC 63  TRIG 54  CHOLHDL 2.7   Anemia Panel: No results for input(s): VITAMINB12, FOLATE, FERRITIN, TIBC, IRON, RETICCTPCT in the last 72 hours. Urine analysis:    Component Value Date/Time   COLORURINE YELLOW 08/20/2020 2156   APPEARANCEUR CLOUDY (A) 08/20/2020 2156   LABSPEC 1.010 08/20/2020 2156   PHURINE 5.0 08/20/2020 2156   GLUCOSEU NEGATIVE 08/20/2020 2156   HGBUR LARGE (A) 08/20/2020 2156   [place] NEGATIVE 08/20/2020 2156   [place] NEGATIVE 08/20/2020 2156   PROTEINUR 30 (A) 08/20/2020 2156   NITRITE NEGATIVE 08/20/2020 2156  LEUKOCYTESUR LARGE (A) 08/20/2020 2156   Sepsis Labs: Invalid input(s): PROCALCITONIN, [place]  Microbiology: Recent Results (from the past 240 hour(s))  Respiratory Panel by RT PCR (Flu A&B, Covid) - Nasopharyngeal Swab     Status: None   Collection Time: 08/20/20  7:05 AM   Specimen: Nasopharyngeal Swab  Result Value Ref Range Status   SARS Coronavirus 2 by RT PCR NEGATIVE NEGATIVE Final    Comment: (NOTE) SARS-CoV-2 target nucleic acids are NOT DETECTED.  The SARS-CoV-2 RNA is generally detectable in upper respiratoy specimens during the acute phase of infection. The lowest concentration of SARS-CoV-2 viral copies this assay can detect is 131 copies/mL. A negative result does not preclude SARS-Cov-2 infection and should not be used as the sole basis for treatment or other patient management decisions. A negative result may occur with  improper specimen collection/handling, submission of specimen other than nasopharyngeal swab, presence of viral mutation(s) within the areas targeted by this assay, and inadequate number of viral copies (<131 copies/mL). A negative result must be combined with clinical observations, patient history, and epidemiological information. The expected result is Negative.  Fact Sheet for Patients:  PinkCheek.be  Fact Sheet for Healthcare Providers:   GravelBags.it  This test is no t yet approved or cleared by the [place] FDA and  has been authorized for detection and/or diagnosis of SARS-CoV-2 by FDA under an Emergency Use Authorization (EUA). This EUA will remain  in effect (meaning this test can be used) for the duration of the COVID-19 declaration under Section 564(b)(1) of the Act, 21 U.S.C. section 360bbb-3(b)(1), unless the authorization is terminated or revoked sooner.     Influenza A by PCR NEGATIVE NEGATIVE Final   Influenza B by PCR NEGATIVE NEGATIVE Final    Comment: (NOTE) The Xpert Xpress SARS-CoV-2/FLU/RSV assay is intended as an aid in  the diagnosis of influenza from Nasopharyngeal swab specimens and  should not be used as a sole basis for treatment. Nasal washings and  aspirates are unacceptable for Xpert Xpress SARS-CoV-2/FLU/RSV  testing.  Fact Sheet for Patients: PinkCheek.be  Fact Sheet for Healthcare Providers: GravelBags.it  This test is not yet approved or cleared by the [place] FDA and  has been authorized for detection and/or diagnosis of SARS-CoV-2 by  FDA under an Emergency Use Authorization (EUA). This EUA will remain  in effect (meaning this test can be used) for the duration of the  Covid-19 declaration under Section 564(b)(1) of the Act, 21  U.S.C. section 360bbb-3(b)(1), unless the authorization is  terminated or revoked. Performed at [facility], [place] [address], [place], [place] [address]     Radiology Studies: No results found.    Mckay Brandt T. [place]  If 7PM-7AM, please contact night-coverage [email or web address] 08/21/2020, 3:54 PM

## 2020-08-22 ENCOUNTER — Observation Stay (HOSPITAL_COMMUNITY): Payer: Medicare Other

## 2020-08-22 ENCOUNTER — Encounter (HOSPITAL_COMMUNITY)
Admit: 2020-08-22 | Discharge: 2020-08-22 | Disposition: A | Discharge status: Home / Self Care | Payer: Medicare Other | Attending: Cardiology | Admitting: Cardiology

## 2020-08-22 ENCOUNTER — Observation Stay (HOSPITAL_BASED_OUTPATIENT_CLINIC_OR_DEPARTMENT_OTHER): Payer: Medicare Other

## 2020-08-22 DIAGNOSIS — Z8551 Personal history of malignant neoplasm of bladder: Secondary | ICD-10-CM | POA: Diagnosis not present

## 2020-08-22 DIAGNOSIS — R7989 Other specified abnormal findings of blood chemistry: Secondary | ICD-10-CM | POA: Diagnosis not present

## 2020-08-22 DIAGNOSIS — N1330 Unspecified hydronephrosis: Secondary | ICD-10-CM

## 2020-08-22 DIAGNOSIS — N390 Urinary tract infection, site not specified: Secondary | ICD-10-CM | POA: Diagnosis not present

## 2020-08-22 DIAGNOSIS — N1370 Vesicoureteral-reflux, unspecified: Secondary | ICD-10-CM | POA: Diagnosis not present

## 2020-08-22 DIAGNOSIS — R079 Chest pain, unspecified: Secondary | ICD-10-CM | POA: Diagnosis not present

## 2020-08-22 DIAGNOSIS — C679 Malignant neoplasm of bladder, unspecified: Secondary | ICD-10-CM

## 2020-08-22 DIAGNOSIS — N179 Acute kidney failure, unspecified: Secondary | ICD-10-CM | POA: Diagnosis not present

## 2020-08-22 LAB — RENAL FUNCTION PANEL
Albumin: 3.5 g/dL (ref 3.5–5.0)
Anion gap: 9 (ref 5–15)
BUN: 38 mg/dL — ABNORMAL HIGH (ref 8–23)
CO2: 20 mmol/L — ABNORMAL LOW (ref 22–32)
Calcium: 8.9 mg/dL (ref 8.9–10.3)
Chloride: 110 mmol/L (ref 98–111)
Creatinine, Ser: 2.28 mg/dL — ABNORMAL HIGH (ref 0.61–1.24)
GFR calc Af Amer: 29 mL/min — ABNORMAL LOW (ref >60)
GFR calc non Af Amer: 25 mL/min — ABNORMAL LOW (ref >60)
Glucose, Bld: 186 mg/dL — ABNORMAL HIGH (ref 70–99)
Phosphorus: 1.8 mg/dL — ABNORMAL LOW (ref 2.5–4.6)
Potassium: 3.9 mmol/L (ref 3.5–5.1)
Sodium: 139 mmol/L (ref 135–145)

## 2020-08-22 LAB — URINE CULTURE: Culture: >=100000 — AB

## 2020-08-22 LAB — HEPATIC FUNCTION PANEL
ALT: 25 U/L (ref 0–44)
AST: 23 U/L (ref 15–41)
Albumin: 3.3 g/dL — ABNORMAL LOW (ref 3.5–5.0)
Alkaline Phosphatase: 71 U/L (ref 38–126)
Bilirubin, Direct: 0.1 mg/dL (ref 0.0–0.2)
Indirect Bilirubin: 0.7 mg/dL (ref 0.3–0.9)
Total Bilirubin: 0.8 mg/dL (ref 0.3–1.2)
Total Protein: 6.6 g/dL (ref 6.5–8.1)

## 2020-08-22 LAB — CBC
HCT: 35.7 % — ABNORMAL LOW (ref 39.0–52.0)
Hemoglobin: 11.9 g/dL — ABNORMAL LOW (ref 13.0–17.0)
MCH: 29.9 pg (ref 26.0–34.0)
MCHC: 33.3 g/dL (ref 30.0–36.0)
MCV: 89.7 fL (ref 80.0–100.0)
Platelets: 277 10*3/uL (ref 150–400)
RBC: 3.98 MIL/uL — ABNORMAL LOW (ref 4.22–5.81)
RDW: 14.7 % (ref 11.5–15.5)
WBC: 9 10*3/uL (ref 4.0–10.5)
nRBC: 0 % (ref 0.0–0.2)

## 2020-08-22 LAB — NM MYOCAR MULTI W/SPECT W/WALL MOTION / EF
Estimated workload: 1 METS
Exercise duration (min): 5 min
Exercise duration (sec): 43 s
Peak HR: 101 {beats}/min
Rest HR: 67 {beats}/min

## 2020-08-22 LAB — MAGNESIUM: Magnesium: 1.6 mg/dL — ABNORMAL LOW (ref 1.7–2.4)

## 2020-08-22 MED ORDER — MAGNESIUM SULFATE 2 GM/50ML IV SOLN
2.0000 g | Freq: Once | INTRAVENOUS | Status: AC
  Administered 2020-08-22: 2 g via INTRAVENOUS
  Filled 2020-08-22: qty 50

## 2020-08-22 MED ORDER — IOHEXOL 300 MG/ML  SOLN
30.0000 mL | Freq: Once | INTRAMUSCULAR | Status: AC | PRN
  Administered 2020-08-22: 30 mL

## 2020-08-22 MED ORDER — TECHNETIUM TC 99M TETROFOSMIN IV KIT
31.3000 | PACK | Freq: Once | INTRAVENOUS | Status: AC | PRN
  Administered 2020-08-22: 31.3 via INTRAVENOUS

## 2020-08-22 MED ORDER — AMLODIPINE BESYLATE 10 MG PO TABS
10.0000 mg | ORAL_TABLET | Freq: Every day | ORAL | Status: DC
  Administered 2020-08-22 – 2020-08-23 (×2): 10 mg via ORAL
  Filled 2020-08-22 (×2): qty 1

## 2020-08-22 MED ORDER — CHLORHEXIDINE GLUCONATE CLOTH 2 % EX PADS
6.0000 | MEDICATED_PAD | Freq: Every day | CUTANEOUS | Status: DC
  Administered 2020-08-22 – 2020-08-23 (×2): 6 via TOPICAL

## 2020-08-22 MED ORDER — SODIUM CHLORIDE 0.9 % IV SOLN
INTRAVENOUS | Status: DC | PRN
  Administered 2020-08-22: 250 mL via INTRAVENOUS

## 2020-08-22 MED ORDER — REGADENOSON 0.4 MG/5ML IV SOLN
INTRAVENOUS | Status: AC
  Administered 2020-08-22: 0.4 mg via INTRAVENOUS
  Filled 2020-08-22: qty 5

## 2020-08-22 MED ORDER — REGADENOSON 0.4 MG/5ML IV SOLN: 0.4000 mg | Freq: Once | INTRAVENOUS | Status: AC

## 2020-08-22 MED ORDER — SODIUM PHOSPHATES 45 MMOLE/15ML IV SOLN
20.0000 mmol | Freq: Once | INTRAVENOUS | Status: AC
  Administered 2020-08-22: 20 mmol via INTRAVENOUS
  Filled 2020-08-22: qty 6.67

## 2020-08-22 MED ORDER — TECHNETIUM TC 99M TETROFOSMIN IV KIT
9.4000 | PACK | Freq: Once | INTRAVENOUS | Status: AC | PRN
  Administered 2020-08-22: 9.4 via INTRAVENOUS

## 2020-08-22 NOTE — Consult Note (Signed)
Urology Consult   Physician requesting consult: Wendee Beavers  Reason for consult: Left hydronephrosis  History of Present Illness: [name] [name] is a 86 y.o. 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  His plan start BCG however was found to have UTI in the office with greater than 100,000 Enterococcus faecalis on 08/15/2020.  He was in the hospital with chest discomfort and diaphoresis for a cardiac rule out for ACS.  His creatinine was noted to be elevated 2.28 from 2.  Renal bladder ultrasound obtained on 08/21/2020 demonstrated left hydronephrosis and signs of medical renal disease of his right kidney.  He denies flank pain or abdominal pain.  He has been voiding without difficulty.  He denies sensation of incomplete bladder emptying.  His PVR is approximately 80 mL.  He denies urinary frequency or urgency.  He has had some dysuria that has been improving on antibiotics.  Past Medical History:  Diagnosis Date  . Arthritis    lower back bulging disc, hips, knees, thumbs, shoulder  . Bladder cancer ([place]) UROLOGIST-  DR MCKENZIE   S/P TURBT 08-16-2017  . BPH (benign prostatic hyperplasia)   . ckd stage 3w    STAGE 3  B CKD per lov 06-14-2020 dr Rica Mast Jeri Cos kidney on chart  . Elevated PSA    RESOLVED SINCE 2018  . Fatigue    MIDDLE OF DAY ON OCCASION  . Frequency of urination   . GERD (gastroesophageal reflux disease)    on protonix  . Hematuria    RESOLVED  . History of colon polyps   . History of dysphagia resolved  . HLD (hyperlipidemia)   . HOH (hard of hearing)    slightly hoh  . HTN (hypertension)   . Hypothyroidism   . Incontinence of urine    only wears protection if he's planning to be in public for an extended amt of time  . Recurrent bladder papillary  carcinoma ([place]) hx 2012 and 2014-- urologist- dr Alyson Ingles   s/p  TURBT 08-16-2017  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  . Shoulder bursitis    undiagnosed. painful and limited ROM on left, RESOLVED  . Sleep apnea    uses cpap  . Weakness of extremity    left knee  . Wears glasses     Past Surgical History:  Procedure Laterality Date  . BCG X 2     AFTER LAST 2 BLADDER TUMOR REMOVALS  . COLONSCOPY  YRS AGO  . CYSTOSCOPY W/ URETERAL STENT PLACEMENT Bilateral 08/16/2017   Procedure: CYSTOSCOPY WITH RETROGRADE PYELOGRAM/URETERAL STENT PLACEMENT;  Surgeon: Cleon Gustin, MD;  Location[facility];  Service: Urology;  Laterality: Bilateral;  . CYSTOSCOPY WITH BIOPSY N/A 06/25/2020   Procedure: CYSTOSCOPY FULGURATION   BLADDER  BIOPSY;  Surgeon: Robley Fries, MD;  Location: [facility];  Service: Urology;  Laterality: N/A;  . CYSTOSCOPY WITH FULGERATION N/A 12/08/2019   Procedure: CYSTOSCOPY WITH FULGERATION;  Surgeon: Cleon Gustin, MD;  Location: [facility];  Service: Urology;  Laterality: N/A;  . KNEE ARTHROSCOPY Left 06/10/2011  . PROSTATE BIOPSY  2000  . TRANSURETHRAL RESECTION OF BLADDER TUMOR N/A 08/16/2017   Procedure: TRANSURETHRAL RESECTION OF BLADDER TUMOR (TURBT);  Surgeon: Cleon Gustin, MD;  Location: [facility];  Service: Urology;  Laterality: N/A;  . TRANSURETHRAL RESECTION OF BLADDER TUMOR  12-09-2012   dr Alona Bene [facility]   and TURP  . TRANSURETHRAL RESECTION OF BLADDER TUMOR N/A 09/20/2017   Procedure: TRANSURETHRAL RESECTION OF BLADDER TUMOR (TURBT);  Surgeon: Cleon Gustin, MD;  Location: [facility];  Service: Urology;  Laterality: N/A;  . TRANSURETHRAL RESECTION OF BLADDER TUMOR N/A 12/08/2019   Procedure: TRANSURETHRAL RESECTION OF BLADDER TUMOR;  Surgeon: Cleon Gustin, MD;  Location: [facility];  Service: Urology;   Laterality: N/A;  1 HR  . TRANSURETHRAL RESECTION OF PROSTATE  2011  . UPPER GI ENDOSCOPY  YRS AGO    Current Hospital Medications:  Home Meds:  No current facility-administered medications on file prior to encounter.   Current Outpatient Medications on File Prior to Encounter  Medication Sig Dispense Refill  . Ascorbic Acid (VITAMIN C PO) Take 1 tablet by mouth 2 (two) times daily.    Marland Kitchen aspirin 81 MG tablet Take 81 mg by mouth at bedtime.     Marland Kitchen aspirin-acetaminophen-caffeine (EXCEDRIN MIGRAINE) 250-250-65 MG tablet Take 1 tablet by mouth every 6 (six) hours as needed for headache.    . Cholecalciferol (VITAMIN D3 PO) Take 1 capsule by mouth daily.    . irbesartan (AVAPRO) 300 MG tablet Take 300 mg by mouth at bedtime.     Marland Kitchen levothyroxine (SYNTHROID, LEVOTHROID) 50 MCG tablet Take 50 mcg by mouth daily before breakfast.     . Multiple Vitamins-Minerals (ZINC PO) Take 1 tablet by mouth daily.    . pantoprazole (PROTONIX) 40 MG tablet Take 40 mg by mouth at bedtime.     . psyllium (METAMUCIL) 58.6 % powder Take 1 packet by mouth every evening. WITH MEDS    . rosuvastatin (CRESTOR) 5 MG tablet Take 5 mg by mouth at bedtime.    . silodosin (RAPAFLO) 8 MG CAPS capsule TAKE ONE CAPSULE BY MOUTH EVERY NIGHT AT BEDTIME (Patient taking differently: Take 8 mg by mouth daily. ) 90 capsule 3  . phenazopyridine (PYRIDIUM) 200 MG tablet Take 1 tablet (200 mg total) by mouth 3 (three) times daily as needed for pain. (Patient not taking: Reported on 08/20/2020) 8 tablet 0     Scheduled Meds: . amLODipine  10 mg Oral Daily  . amoxicillin  500 mg Oral BID  . aspirin EC  81 mg Oral QHS  . enoxaparin (LOVENOX) injection  30 mg Subcutaneous QHS  . levothyroxine  50 mcg Oral Q0600  . pantoprazole  40 mg Oral QHS  . psyllium  1 packet Oral QHS  . rosuvastatin  5 mg Oral QHS  . tamsulosin  0.4 mg Oral Daily   Continuous Infusions: . sodium chloride 250 mL (08/22/20 1303)  . sodium phosphate  Dextrose  5% IVPB 20 mmol (08/22/20 1458)   PRN Meds:.sodium chloride, acetaminophen, ondansetron (ZOFRAN) IV  Allergies:  Allergies  Allergen Reactions  . Macrobid [Nitrofurantoin] Shortness Of Breath  . Ciprofloxacin     REACTION: diarrhea.  Occurred with 750mg  dose.  Has since taken 500mg  doses and has had no reaction/diarrhea.  . Cephalexin Rash    Rash on arms    Family History  Problem Relation Age of Onset  . Hypertension Mother   . Breast cancer Mother   . Other Mother        brain tumor  . Alzheimer's disease Mother   . Emphysema Father        smoker  Social History:  reports that he has never smoked. He has never used smokeless tobacco. He reports current alcohol use of about 1.0 - 2.0 standard drink of alcohol per week. He reports that he does not use drugs.  ROS: A complete review of systems was performed.  All systems are negative except for pertinent findings as noted.  Physical Exam:  Vital signs in last 24 hours: Temp:  [97.6 F (36.4 C)-98.6 F (37 C)] 97.6 F (36.4 C) (09/30 1223) Pulse Rate:  [62-92] 62 (09/30 1223) Resp:  [16-19] 19 (09/30 1223) BP: (115-140)/(60-81) 115/65 (09/30 1223) SpO2:  [96 %-99 %] 98 % (09/30 1223) Constitutional:  Alert and oriented, No acute distress Cardiovascular: Regular rate and rhythm Respiratory: Normal respiratory effort, Lungs clear bilaterally GI: Abdomen is soft, nontender, nondistended, no abdominal masses GU: No CVA tenderness Neurologic: Grossly intact, no focal deficits Psychiatric: Normal mood and affect  Laboratory Data:  Recent Labs    08/20/20 0713 08/20/20 1138 08/22/20 0041  WBC 14.6*  --  9.0  HGB 12.8* 12.6* 11.9*  HCT 38.0* 37.0* 35.7*  PLT 258  --  277    Recent Labs    08/20/20 0628 08/20/20 1138 08/22/20 0041  NA 137 142 139  K 3.7 4.1 3.9  CL 105 109 110  GLUCOSE 154* 129* 186*  BUN 31* 30* 38*  CALCIUM 9.3  --  8.9  CREATININE 2.03* 2.00* 2.28*     Results for orders placed or  performed during the hospital encounter of 08/20/20 (from the past 24 hour(s))  Hepatic function panel     Status: Abnormal   Collection Time: 08/22/20 12:41 AM  Result Value Ref Range   Total Protein 6.6 6.5 - 8.1 g/dL   Albumin 3.3 (L) 3.5 - 5.0 g/dL   AST 23 15 - 41 U/L   ALT 25 0 - 44 U/L   Alkaline Phosphatase 71 38 - 126 U/L   Total Bilirubin 0.8 0.3 - 1.2 mg/dL   Bilirubin, Direct 0.1 0.0 - 0.2 mg/dL   Indirect Bilirubin 0.7 0.3 - 0.9 mg/dL  Renal function panel     Status: Abnormal   Collection Time: 08/22/20 12:41 AM  Result Value Ref Range   Sodium 139 135 - 145 mmol/L   Potassium 3.9 3.5 - 5.1 mmol/L   Chloride 110 98 - 111 mmol/L   CO2 20 (L) 22 - 32 mmol/L   Glucose, Bld 186 (H) 70 - 99 mg/dL   BUN 38 (H) 8 - 23 mg/dL   Creatinine, Ser 2.28 (H) 0.61 - 1.24 mg/dL   Calcium 8.9 8.9 - 10.3 mg/dL   Phosphorus 1.8 (L) 2.5 - 4.6 mg/dL   Albumin 3.5 3.5 - 5.0 g/dL   GFR calc non Af Amer 25 (L) >60 mL/min   GFR calc Af Amer 29 (L) >60 mL/min   Anion gap 9 5 - 15  Magnesium     Status: Abnormal   Collection Time: 08/22/20 12:41 AM  Result Value Ref Range   Magnesium 1.6 (L) 1.7 - 2.4 mg/dL  CBC     Status: Abnormal   Collection Time: 08/22/20 12:41 AM  Result Value Ref Range   WBC 9.0 4.0 - 10.5 K/uL   RBC 3.98 (L) 4.22 - 5.81 MIL/uL   Hemoglobin 11.9 (L) 13.0 - 17.0 g/dL   HCT 35.7 (L) 39 - 52 %   MCV 89.7 80.0 - 100.0 fL   MCH 29.9 26.0 - 34.0 pg   MCHC  33.3 30.0 - 36.0 g/dL   RDW 14.7 11.5 - 15.5 %   Platelets 277 150 - 400 K/uL   nRBC 0.0 0.0 - 0.2 %   Recent Results (from the past 240 hour(s))  Respiratory Panel by RT PCR (Flu A&B, Covid) - Nasopharyngeal Swab     Status: None   Collection Time: 08/20/20  7:05 AM   Specimen: Nasopharyngeal Swab  Result Value Ref Range Status   SARS Coronavirus 2 by RT PCR NEGATIVE NEGATIVE Final    Comment: (NOTE) SARS-CoV-2 target nucleic acids are NOT DETECTED.  The SARS-CoV-2 RNA is generally detectable in upper  respiratoy specimens during the acute phase of infection. The lowest concentration of SARS-CoV-2 viral copies this assay can detect is 131 copies/mL. A negative result does not preclude SARS-Cov-2 infection and should not be used as the sole basis for treatment or other patient management decisions. A negative result may occur with  improper specimen collection/handling, submission of specimen other than nasopharyngeal swab, presence of viral mutation(s) within the areas targeted by this assay, and inadequate number of viral copies (<131 copies/mL). A negative result must be combined with clinical observations, patient history, and epidemiological information. The expected result is Negative.  Fact Sheet for Patients:  PinkCheek.be  Fact Sheet for Healthcare Providers:  GravelBags.it  This test is no t yet approved or cleared by the [place] FDA and  has been authorized for detection and/or diagnosis of SARS-CoV-2 by FDA under an Emergency Use Authorization (EUA). This EUA will remain  in effect (meaning this test can be used) for the duration of the COVID-19 declaration under Section 564(b)(1) of the Act, 21 U.S.C. section 360bbb-3(b)(1), unless the authorization is terminated or revoked sooner.     Influenza A by PCR NEGATIVE NEGATIVE Final   Influenza B by PCR NEGATIVE NEGATIVE Final    Comment: (NOTE) The Xpert Xpress SARS-CoV-2/FLU/RSV assay is intended as an aid in  the diagnosis of influenza from Nasopharyngeal swab specimens and  should not be used as a sole basis for treatment. Nasal washings and  aspirates are unacceptable for Xpert Xpress SARS-CoV-2/FLU/RSV  testing.  Fact Sheet for Patients: PinkCheek.be  Fact Sheet for Healthcare Providers: GravelBags.it  This test is not yet approved or cleared by the [place] FDA and  has been  authorized for detection and/or diagnosis of SARS-CoV-2 by  FDA under an Emergency Use Authorization (EUA). This EUA will remain  in effect (meaning this test can be used) for the duration of the  Covid-19 declaration under Section 564(b)(1) of the Act, 21  U.S.C. section 360bbb-3(b)(1), unless the authorization is  terminated or revoked. Performed at [facility], [place] [address] [place], [place] [address]   Culture, Urine     Status: Abnormal   Collection Time: 08/20/20  9:56 PM   Specimen: Urine, Clean Catch  Result Value Ref Range Status   Specimen Description   Final    URINE, CLEAN CATCH Performed at [facility], [place] [address], [place], [place] [address]    Special Requests   Final    NONE Performed at [facility], [place] [address], [place],  [address]    Culture >=100,000 COLONIES/mL ENTEROCOCCUS FAECALIS (A)  Final   Report Status 08/22/2020 FINAL  Final   Organism ID, Bacteria ENTEROCOCCUS FAECALIS (A)  Final      Susceptibility   Enterococcus faecalis - MIC*    AMPICILLIN <=2 SENSITIVE Sensitive  NITROFURANTOIN <=16 SENSITIVE Sensitive     VANCOMYCIN 1 SENSITIVE Sensitive     * >=100,000 COLONIES/mL ENTEROCOCCUS FAECALIS    Renal Function: Recent Labs    08/20/20 0628 08/20/20 1138 08/22/20 0041  CREATININE 2.03* 2.00* 2.28*   Estimated Creatinine Clearance: 25.6 mL/min (A) (by C-G formula based on SCr of 2.28 mg/dL (H)).  Radiologic Imaging: US Abdomen Complete  Result Date: 08/21/2020 CLINICAL DATA:  Nausea EXAM: ABDOMEN ULTRASOUND COMPLETE COMPARISON:  Ultrasound kidney 10/14/2012., CT abdomen pelvis 09/11/2017 FINDINGS: Gallbladder: No gallstones or wall thickening visualized. No sonographic Murphy sign noted by sonographer. Common bile duct: Diameter: 27mm. Liver: No focal lesion identified. Within normal limits in parenchymal echogenicity. Portal vein is patent on color Doppler imaging with  normal direction of blood flow towards the liver. IVC: No abnormality visualized. Pancreas: Visualized portion unremarkable. Spleen: Size and appearance within normal limits. Right Kidney: Poorly visualized. Length: 9.5 cm. Echogenicity is increased. No mass or hydronephrosis visualized. Left Kidney: Length: 11.6. Echogenicity is increased. No mass at. Moderate hydronephrosis visualized. Abdominal aorta: No aneurysm visualized. Other findings: None. IMPRESSION: 1. Moderate left hydronephrosis. 2. Findings suggestive of chronic renal disease within atrophic right kidney. Please note only partial visualization of the right kidney. 3. Remainder of the abdominal ultrasound is unremarkable. Electronically Signed   By: Iven Finn M.D.   On: 08/21/2020 21:13   NM Myocar Multi W/Spect W/Wall Motion / EF  Result Date: 08/22/2020 CLINICAL DATA:  86 year old male with chest pain EXAM: MYOCARDIAL IMAGING WITH SPECT (REST AND PHARMACOLOGIC-STRESS) GATED LEFT VENTRICULAR WALL MOTION STUDY LEFT VENTRICULAR EJECTION FRACTION TECHNIQUE: Standard myocardial SPECT imaging was performed after resting intravenous injection of 9.4 mCi Tc-102m tetrofosmin. Subsequently, intravenous infusion of Lexiscan was performed under the supervision of the Cardiology staff. At peak effect of the drug, 31.3 mCi Tc-59m tetrofosmin was injected intravenously and standard myocardial SPECT imaging was performed. Quantitative gated imaging was also performed to evaluate left ventricular wall motion, and estimate left ventricular ejection fraction. COMPARISON:  None. FINDINGS: Perfusion: No decreased activity in the left ventricle on stress imaging to suggest reversible ischemia or infarction. Wall Motion: Normal left ventricular wall motion. No left ventricular dilation. Left Ventricular Ejection Fraction: 61 % End diastolic volume 93 ml End systolic volume 36 ml IMPRESSION: 1. No reversible ischemia or infarction. 2. Normal left ventricular wall  motion. 3. Left ventricular ejection fraction 61% 4. Non invasive risk stratification*: Low *2012 Appropriate Use Criteria for Coronary Revascularization Focused Update: J Am Coll Cardiol. 9371;69(6):789-381. http://content.airportbarriers.com.aspx?articleid=[email or web address] Electronically Signed   By: Corrie Mckusick D.O.   On: 08/22/2020 16:45   VAS [place] LOWER EXTREMITY VENOUS (DVT)  Result Date: 08/22/2020  Lower Venous DVTStudy Indications: Elevated d-dimer.  Comparison Study: No prior studies. Performing Technologist: Darlin Coco  Examination Guidelines: A complete evaluation includes B-mode imaging, spectral Doppler, color Doppler, and power Doppler as needed of all accessible portions of each vessel. Bilateral testing is considered an integral part of a complete examination. Limited examinations for reoccurring indications may be performed as noted. The reflux portion of the exam is performed with the patient in reverse Trendelenburg.  +---------+---------------+---------+-----------+----------+--------------+ RIGHT    CompressibilityPhasicitySpontaneityPropertiesThrombus Aging +---------+---------------+---------+-----------+----------+--------------+ CFV      Full           Yes      Yes                                 +---------+---------------+---------+-----------+----------+--------------+  SFJ      Full                                                        +---------+---------------+---------+-----------+----------+--------------+ FV Prox  Full                                                        +---------+---------------+---------+-----------+----------+--------------+ FV Mid   Full                                                        +---------+---------------+---------+-----------+----------+--------------+ FV DistalFull                                                        +---------+---------------+---------+-----------+----------+--------------+ PFV       Full                                                        +---------+---------------+---------+-----------+----------+--------------+ POP      Full           Yes      Yes                                 +---------+---------------+---------+-----------+----------+--------------+ PTV      Full                                                        +---------+---------------+---------+-----------+----------+--------------+ PERO     Full                                                        +---------+---------------+---------+-----------+----------+--------------+   +---------+---------------+---------+-----------+----------+--------------+ LEFT     CompressibilityPhasicitySpontaneityPropertiesThrombus Aging +---------+---------------+---------+-----------+----------+--------------+ CFV      Full           Yes      Yes                                 +---------+---------------+---------+-----------+----------+--------------+ SFJ      Full                                                        +---------+---------------+---------+-----------+----------+--------------+  FV Prox  Full                                                        +---------+---------------+---------+-----------+----------+--------------+ FV Mid   Full                                                        +---------+---------------+---------+-----------+----------+--------------+ FV DistalFull                                                        +---------+---------------+---------+-----------+----------+--------------+ PFV      Full                                                        +---------+---------------+---------+-----------+----------+--------------+ POP      Full           Yes      Yes                                 +---------+---------------+---------+-----------+----------+--------------+ PTV      Full                                                         +---------+---------------+---------+-----------+----------+--------------+ PERO     Full                                                        +---------+---------------+---------+-----------+----------+--------------+     Summary: RIGHT: - There is no evidence of deep vein thrombosis in the lower extremity.  - No cystic structure found in the popliteal fossa.  LEFT: - There is no evidence of deep vein thrombosis in the lower extremity.  - No cystic structure found in the popliteal fossa.  *See table(s) above for measurements and observations. Electronically signed by Monica Martinez MD on 08/22/2020 at 2:22:43 PM.    Final     I independently reviewed the above imaging studies.  Impression/Recommendation 1. Left hydronephrosis: Renal bladder ultrasound 08/13/2020 with moderate left hydronephrosis.  Creatinine 2.28 on 08/22/2020 from a baseline of 2.  PVR only 80 mL.  Possible ureteral reflux given history of TURBT with resection over ureteral orifices.  2.  Urinary tract infection: Urine culture 08/15/2020 with greater than 100,000 3.  History of high-grade TA urothelial carcinoma the bladder.  Most recently status post bladder biopsy on 06/25/2020 that revealed low-grade papillary urothelial carcinoma. 4.  AKI on CKD: Creatinine 2.28 on  08/22/2020 from 2  I reviewed imaging with the patient including left moderate hydronephrosis on renal ultrasound.  As his creatinine is rising there is certainly concern for obstruction.  However as he has a history of bladder cancer status post TURBT with possible resection over UO, is possible that he has reflux into his left kidney.  Will obtain a CT cystogram to confirm reflux.  Trend creatinine.  If creatinine up trends or fails to normalize with persistent hydronephrosis, he may benefit from left ureteral stent placement.  No need for stent tonight. Continue antibiotics for UTI.  Janith Lima 08/22/2020, 5:48 PM   Matt R. Derry Arbogast  MD [facility]  Pager: [phone]   CC: Dr. Wendee Beavers

## 2020-08-22 NOTE — Plan of Care (Signed)
  Problem: Activity: Goal: Risk for activity intolerance will decrease Outcome: Completed/Met   Problem: Pain Managment: Goal: General experience of comfort will improve Outcome: Completed/Met   

## 2020-08-22 NOTE — Progress Notes (Signed)
Noted CT cystogram with left sided vesicoureteral reflux. Left hydro presumably due to reflux. No plan for stent placement. Continue antibiotics for UTI. Trend creatinine.  Matt R. [facility]  Pager: [phone]

## 2020-08-22 NOTE — Progress Notes (Signed)
Progress Note  Patient Name: [name] [name] Date of Encounter: 08/22/2020  [facility] Cardiologist: Buford Dresser, MD   Subjective   Plan for Myoview today. Creatinine up from yesterday. A little sob on exam however he says he just rushed to get ready. No chest pain.   Inpatient Medications    Scheduled Meds: . amLODipine  10 mg Oral Daily  . amoxicillin  500 mg Oral BID  . aspirin EC  81 mg Oral QHS  . enoxaparin (LOVENOX) injection  30 mg Subcutaneous QHS  . levothyroxine  50 mcg Oral Q0600  . pantoprazole  40 mg Oral QHS  . psyllium  1 packet Oral QHS  . rosuvastatin  5 mg Oral QHS  . tamsulosin  0.4 mg Oral Daily   Continuous Infusions: . magnesium sulfate bolus IVPB    . sodium phosphate  Dextrose 5% IVPB     PRN Meds: acetaminophen, ondansetron (ZOFRAN) IV   Vital Signs    Vitals:   08/21/20 0559 08/21/20 1240 08/21/20 2009 08/22/20 0445  BP: (!) 110/55 107/61 130/65 140/60  Pulse: 65 70 67 92  Resp: 16 18 16    Temp: 97.9 F (36.6 C) 97.7 F (36.5 C) 98.2 F (36.8 C) 98.6 F (37 C)  TempSrc:  Oral  Oral  SpO2: 95% 96% 96% 96%  Weight:      Height:        Intake/Output Summary (Last 24 hours) at 08/22/2020 0745 Last data filed at 08/21/2020 2300 Gross per 24 hour  Intake 480 ml  Output 200 ml  Net 280 ml   Last 3 Weights 08/20/2020 06/25/2020 06/19/2020  Weight (lbs) 191 lb 193 lb 12.8 oz 191 lb  Weight (kg) 86.637 kg 87.907 kg 86.637 kg      Telemetry    NSR, HR 60-70s, brief 1st degree AV block, ST up to 120s, possible very brief SVT - Personally Reviewed  ECG    No new - Personally Reviewed  Physical Exam   GEN: No acute distress.   Neck: No JVD Cardiac: RRR, no murmurs, rubs, or gallops.  Respiratory: Clear to auscultation bilaterally. GI: Soft, nontender, non-distended  MS: No edema; No deformity. Neuro:  Nonfocal  Psych: Normal affect   Labs    High Sensitivity Troponin:   Recent Labs  Lab 08/20/20 0628  08/20/20 1232  TROPONINIHS 7 9      Chemistry Recent Labs  Lab 08/20/20 0628 08/20/20 1138 08/22/20 0041  NA 137 142 139  K 3.7 4.1 3.9  CL 105 109 110  CO2 19*  --  20*  GLUCOSE 154* 129* 186*  BUN 31* 30* 38*  CREATININE 2.03* 2.00* 2.28*  CALCIUM 9.3  --  8.9  PROT  --   --  6.6  ALBUMIN  --   --  3.3*  3.5  AST  --   --  23  ALT  --   --  25  ALKPHOS  --   --  71  BILITOT  --   --  0.8  GFRNONAA 29*  --  25*  GFRAA 34*  --  29*  ANIONGAP 13  --  9     Hematology Recent Labs  Lab 08/20/20 0713 08/20/20 1138 08/22/20 0041  WBC 14.6*  --  9.0  RBC 4.28  --  3.98*  HGB 12.8* 12.6* 11.9*  HCT 38.0* 37.0* 35.7*  MCV 88.8  --  89.7  MCH 29.9  --  29.9  MCHC  33.7  --  33.3  RDW 14.6  --  14.7  PLT 258  --  277    BNP Recent Labs  Lab 08/20/20 1908  BNP 87.5     DDimer  Recent Labs  Lab 08/20/20 0644  DDIMER 4.36*     Radiology    NM Pulmonary Perfusion  Result Date: 08/20/2020 CLINICAL DATA:  Suspected. EXAM: NUCLEAR MEDICINE PERFUSION LUNG SCAN TECHNIQUE: Perfusion images were obtained in multiple projections after intravenous injection of radiopharmaceutical. Ventilation scans intentionally deferred if perfusion scan and chest x-ray adequate for interpretation during COVID 19 epidemic. RADIOPHARMACEUTICALS:  3.8 mCi Tc-73m MAA IV COMPARISON:  Chest x-ray 08/20/2020. FINDINGS: No prominent perfusion defects noted. Low probability pulmonary embolus. Given the high clinical suspicion for pulmonary embolus bilateral lower extremity color flow duplex Doppler can be obtained as needed. IMPRESSION: No prominent perfusion defects.  Low probability pulmonary embolus. Electronically Signed   By: Marcello Moores  Register   On: 08/20/2020 11:56   US Abdomen Complete  Result Date: 08/21/2020 CLINICAL DATA:  Nausea EXAM: ABDOMEN ULTRASOUND COMPLETE COMPARISON:  Ultrasound kidney 10/14/2012., CT abdomen pelvis 09/11/2017 FINDINGS: Gallbladder: No gallstones or wall  thickening visualized. No sonographic Murphy sign noted by sonographer. Common bile duct: Diameter: 71mm. Liver: No focal lesion identified. Within normal limits in parenchymal echogenicity. Portal vein is patent on color Doppler imaging with normal direction of blood flow towards the liver. IVC: No abnormality visualized. Pancreas: Visualized portion unremarkable. Spleen: Size and appearance within normal limits. Right Kidney: Poorly visualized. Length: 9.5 cm. Echogenicity is increased. No mass or hydronephrosis visualized. Left Kidney: Length: 11.6. Echogenicity is increased. No mass at. Moderate hydronephrosis visualized. Abdominal aorta: No aneurysm visualized. Other findings: None. IMPRESSION: 1. Moderate left hydronephrosis. 2. Findings suggestive of chronic renal disease within atrophic right kidney. Please note only partial visualization of the right kidney. 3. Remainder of the abdominal ultrasound is unremarkable. Electronically Signed   By: Iven Finn M.D.   On: 08/21/2020 21:13    Cardiac Studies   Echo 05/03/20 1. Left ventricular ejection fraction, by estimation, is 55 to 60%. The  left ventricle has normal function. The left ventricle has no regional  wall motion abnormalities. There is mild concentric left ventricular  hypertrophy. Left ventricular diastolic  parameters were normal.  2. Right ventricular systolic function is normal. The right ventricular  size is normal.  3. Left atrial size was mildly dilated.  4. The mitral valve is normal in structure. Trivial mitral valve  regurgitation. No evidence of mitral stenosis.  5. The aortic valve is tricuspid. Aortic valve regurgitation is mild.  Mild aortic valve sclerosis is present, with no evidence of aortic valve  stenosis.  6. There is mild dilatation of the ascending aorta measuring 39 mm.  7. The inferior vena cava is normal in size with greater than 50%  respiratory variability, suggesting right atrial pressure of  3 mmHg.   ETT 05/24/20  Blood pressure demonstrated a hypertensive response to exercise.  Upsloping ST segment depression ST segment depression was noted during stress in the III, II, aVF, V6, V5 and V4 leads.  Negative, adequate stress test.    Patient Profile     86 y.o. male with a hx of sleep apnea on Bipap, bladder cancer, HTN, HLD, and CKD stage 3 who is being seen today for the evaluation of chest pain.  Assessment & Plan    Atypical Chest pain/SOB - presents with sudden onset sob and pleuritic CP. Non exertional. EKG  with ST and some minimal ST depression lateral leads. HS troponin negative x 2. Creatinine elevated to 2. Normal BNP.  D-dimer elevated but perfusion scan with low probability.  - Patient felt symptoms were possibly an allergic rxn to nitrofurantoin.  - Recent echo showed good pump function - ETT in July 2021 showed no ischemia, just elevated Bps in response to stress - Continue Aspirin and statin - A1C 6.2 - Plan for Myoview today. Patient denies recurrent chest pain  HTN - continue Irbesartan 300 mg daily - BP this AM 110/55  HLD - continue crestor - LDL 63 07/2020  CKD stage 3 - creatinine 2.0>2.28. BUN 38 - not on nephrotoxic drugs - continue to monitor  UTI/hisotry of bladder cacner - Possible allergic rxn to nitrofurantoin - now on Cipro - per IM/Urology  For questions or updates, please contact [place] Please consult [email or web address] for contact info under        Signed, Eleasha Cataldo Ninfa Meeker, PA-C  08/22/2020, 7:45 AM

## 2020-08-22 NOTE — Progress Notes (Signed)
PROGRESS NOTE  [name] [name] GGY:[id number] DOB: 1932/09/25   PCP: [name] Seashore, MD  Patient is from: Home  DOA: 08/20/2020 LOS: 0  Brief Narrative / Interim history: 86 year old male with history of recurrent bladder papillary cancer s/p TURBT in 2018, and BPH followed by urology, recent UTI started on Macrobid, CKD-3B, HTN and hypothyroidism presenting with chest discomfort with associated diaphoresis, nausea and shortness of breath that woke him up from sleep.   Of note, he was recently started on Macrobid for UTI by his urologist.  In ED, hemodynamically stable.  EKG, serial troponin, BNP and CXR without significant finding.  D-dimer elevated to 4.26.  Pulmonary perfusion scan without significant finding.  CTA was not done due to CKD.  He was admitted for chest pain to rule out ACS.  Cardiology consulted.  ACS excluded by serial troponin, EKG and low risk Lexiscan.  Patient remained free of chest pain.  In regards to UTI, urine culture at urology office and here grew Enterococcus E. Coli.  Antibiotics changed to amoxicillin.   Patient had an abdominal ultrasound that showed moderate left hydronephrosis.  Urology consulted and planning stent placement.   Subjective: Seen and examined later this afternoon after he returned from stress test.  No further chest pain.  Denies shortness of breath, GI or UTI symptoms.  He admits to increased frequency of urination and some sense of incomplete void but denies dysuria or hematuria.  He reports some small debris's in urine which he attributes to urologic procedure he had at urology office.  Objective: Vitals:   08/21/20 2009 08/22/20 0445 08/22/20 1207 08/22/20 1223  BP: 130/65 140/60 127/61 115/65  Pulse: 67 92 69 62  Resp: 16  18 19   Temp: 98.2 F (36.8 C) 98.6 F (37 C) 98 F (36.7 C) 97.6 F (36.4 C)  TempSrc:  Oral Oral Oral  SpO2: 96% 96% 99% 98%  Weight:      Height:        Intake/Output Summary (Last 24 hours)  at 08/22/2020 1713 Last data filed at 08/22/2020 1440 Gross per 24 hour  Intake 50 ml  Output 200 ml  Net -150 ml   Filed Weights   08/20/20 0444  Weight: 86.6 kg    Examination:  GENERAL: No apparent distress.  Nontoxic. HEENT: MMM.  Vision and hearing grossly intact.  NECK: Supple.  No apparent JVD.  RESP:  No IWOB.  Fair aeration bilaterally. CVS:  RRR. Heart sounds normal.  ABD/GI/GU: BS+. Abd soft, NTND.  MSK/EXT:  Moves extremities. No apparent deformity. No edema.  SKIN: no apparent skin lesion or wound NEURO: Awake, alert and oriented appropriately.  No apparent focal neuro deficit. PSYCH: Calm. Normal affect.   Procedures:  None  Microbiology summarized: [id number] and influenza PCR negative. Urine culture pending.  Assessment & Plan: Chest pain: association with dyspnea, diaphoresis and nausea concerning but he describes his pain as sharp and not exertional.  Serial troponin, EKG, BNP, CXR and Lexiscan without significant finding.  Elevated D-dimer but perfusion scan low risk.  LE Doppler negative for DVT. A1c 6.2%.  LDL 63.  Chest pain resolved. -Continue statin and aspirin  Enterococcus faecalis UTI-urine culture at urology office and here with pansensitive Enterococcus. History of bladder cancer s/p TURBT Moderate left-sided hydronephrosis  BPH with LUTS-frequency and sense of incomplete voids -Changed antibiotic to amoxicillin -Urology, [name] [name] consulted-planning stent placement on 10/1. -Continue Flomax.  On Rapaflo at home  CKD-3B on the verge of CKD-4: Cr  2.0>> 2.28 -Hold Avapro -Amlodipine for BP  Essential hypertension: Normotensive -Holding Avapro -Amlodipine for BP  Hypothyroidism -Continue home Synthroid  Hypomagnesemia/hypophosphatemia -Replenish and recheck.   Body mass index is 28.62 kg/m.         DVT prophylaxis:  enoxaparin (LOVENOX) injection 30 mg Start: 08/20/20 2200  Code Status: Partial code-everything except  intubation Family Communication: Patient and/or RN. Available if any question. Status is: Observation  The patient remains OBS appropriate and will d/c before 2 midnights.  Dispo: The patient is from: Home              Anticipated d/c is to: Home              Anticipated d/c date is: 1 day              Patient currently is not medically stable to d/c.       Consultants:  Cardiology Urology   Sch Meds:  Scheduled Meds: . amLODipine  10 mg Oral Daily  . amoxicillin  500 mg Oral BID  . aspirin EC  81 mg Oral QHS  . enoxaparin (LOVENOX) injection  30 mg Subcutaneous QHS  . levothyroxine  50 mcg Oral Q0600  . pantoprazole  40 mg Oral QHS  . psyllium  1 packet Oral QHS  . rosuvastatin  5 mg Oral QHS  . tamsulosin  0.4 mg Oral Daily   Continuous Infusions: . sodium chloride 250 mL (08/22/20 1303)  . sodium phosphate  Dextrose 5% IVPB 20 mmol (08/22/20 1458)   PRN Meds:.sodium chloride, acetaminophen, ondansetron (ZOFRAN) IV  Antimicrobials: Anti-infectives (From admission, onward)   Start     Dose/Rate Route Frequency Ordered Stop   08/21/20 1300  amoxicillin (AMOXIL) capsule 500 mg        500 mg Oral 2 times daily 08/21/20 1059     08/20/20 1845  ciprofloxacin (CIPRO) tablet 500 mg  Status:  Discontinued        500 mg Oral Daily 08/20/20 1834 08/21/20 1059       I have personally reviewed the following labs and images: CBC: Recent Labs  Lab 08/20/20 0713 08/20/20 1138 08/22/20 0041  WBC 14.6*  --  9.0  NEUTROABS 13.6*  --   --   HGB 12.8* 12.6* 11.9*  HCT 38.0* 37.0* 35.7*  MCV 88.8  --  89.7  PLT 258  --  277   BMP &GFR Recent Labs  Lab 08/20/20 0628 08/20/20 1138 08/22/20 0041  NA 137 142 139  K 3.7 4.1 3.9  CL 105 109 110  CO2 19*  --  20*  GLUCOSE 154* 129* 186*  BUN 31* 30* 38*  CREATININE 2.03* 2.00* 2.28*  CALCIUM 9.3  --  8.9  MG  --   --  1.6*  PHOS  --   --  1.8*   Estimated Creatinine Clearance: 25.6 mL/min (A) (by C-G formula  based on SCr of 2.28 mg/dL (H)). Liver & Pancreas: Recent Labs  Lab 08/22/20 0041  AST 23  ALT 25  ALKPHOS 71  BILITOT 0.8  PROT 6.6  ALBUMIN 3.3*  3.5   Recent Labs  Lab 08/21/20 1233  LIPASE 33  AMYLASE 86   No results for input(s): AMMONIA in the last 168 hours. Diabetic: Recent Labs    08/20/20 0713  HGBA1C 6.2*   No results for input(s): GLUCAP in the last 168 hours. Cardiac Enzymes: No results for input(s): CKTOTAL, CKMB, CKMBINDEX, TROPONINI in the last 168  hours. No results for input(s): PROBNP in the last 8760 hours. Coagulation Profile: No results for input(s): INR, PROTIME in the last 168 hours. Thyroid Function Tests: No results for input(s): TSH, T4TOTAL, FREET4, T3FREE, THYROIDAB in the last 72 hours. Lipid Profile: Recent Labs    08/20/20 1908  CHOL 117  HDL 43  LDLCALC 63  TRIG 54  CHOLHDL 2.7   Anemia Panel: No results for input(s): VITAMINB12, FOLATE, FERRITIN, TIBC, IRON, RETICCTPCT in the last 72 hours. Urine analysis:    Component Value Date/Time   COLORURINE YELLOW 08/20/2020 2156   APPEARANCEUR CLOUDY (A) 08/20/2020 2156   LABSPEC 1.010 08/20/2020 2156   PHURINE 5.0 08/20/2020 2156   GLUCOSEU NEGATIVE 08/20/2020 2156   HGBUR LARGE (A) 08/20/2020 2156   [place] NEGATIVE 08/20/2020 2156   [place] NEGATIVE 08/20/2020 2156   PROTEINUR 30 (A) 08/20/2020 2156   NITRITE NEGATIVE 08/20/2020 2156   LEUKOCYTESUR LARGE (A) 08/20/2020 2156   Sepsis Labs: Invalid input(s): PROCALCITONIN, [place]  Microbiology: Recent Results (from the past 240 hour(s))  Respiratory Panel by RT PCR (Flu A&B, Covid) - Nasopharyngeal Swab     Status: None   Collection Time: 08/20/20  7:05 AM   Specimen: Nasopharyngeal Swab  Result Value Ref Range Status   SARS Coronavirus 2 by RT PCR NEGATIVE NEGATIVE Final    Comment: (NOTE) SARS-CoV-2 target nucleic acids are NOT DETECTED.  The SARS-CoV-2 RNA is generally detectable in upper  respiratoy specimens during the acute phase of infection. The lowest concentration of SARS-CoV-2 viral copies this assay can detect is 131 copies/mL. A negative result does not preclude SARS-Cov-2 infection and should not be used as the sole basis for treatment or other patient management decisions. A negative result may occur with  improper specimen collection/handling, submission of specimen other than nasopharyngeal swab, presence of viral mutation(s) within the areas targeted by this assay, and inadequate number of viral copies (<131 copies/mL). A negative result must be combined with clinical observations, patient history, and epidemiological information. The expected result is Negative.  Fact Sheet for Patients:  PinkCheek.be  Fact Sheet for Healthcare Providers:  GravelBags.it  This test is no t yet approved or cleared by the [place] FDA and  has been authorized for detection and/or diagnosis of SARS-CoV-2 by FDA under an Emergency Use Authorization (EUA). This EUA will remain  in effect (meaning this test can be used) for the duration of the COVID-19 declaration under Section 564(b)(1) of the Act, 21 U.S.C. section 360bbb-3(b)(1), unless the authorization is terminated or revoked sooner.     Influenza A by PCR NEGATIVE NEGATIVE Final   Influenza B by PCR NEGATIVE NEGATIVE Final    Comment: (NOTE) The Xpert Xpress SARS-CoV-2/FLU/RSV assay is intended as an aid in  the diagnosis of influenza from Nasopharyngeal swab specimens and  should not be used as a sole basis for treatment. Nasal washings and  aspirates are unacceptable for Xpert Xpress SARS-CoV-2/FLU/RSV  testing.  Fact Sheet for Patients: PinkCheek.be  Fact Sheet for Healthcare Providers: GravelBags.it  This test is not yet approved or cleared by the [place] FDA and  has been  authorized for detection and/or diagnosis of SARS-CoV-2 by  FDA under an Emergency Use Authorization (EUA). This EUA will remain  in effect (meaning this test can be used) for the duration of the  Covid-19 declaration under Section 564(b)(1) of the Act, 21  U.S.C. section 360bbb-3(b)(1), unless the authorization is  terminated or revoked. Performed at [facility], [place] [address], [place], [place] [address]   Culture, Urine     Status: Abnormal   Collection Time: 08/20/20  9:56 PM   Specimen: Urine, Clean Catch  Result Value Ref Range Status   Specimen Description   Final    URINE, CLEAN CATCH Performed at [facility], [place] [address] [place], [place] [address]    Special Requests   Final    NONE Performed at [facility], [place] [address], [place], [place] [address]    Culture >=100,000 COLONIES/mL ENTEROCOCCUS FAECALIS (A)  Final   Report Status 08/22/2020 FINAL  Final   Organism ID, Bacteria ENTEROCOCCUS FAECALIS (A)  Final      Susceptibility   Enterococcus faecalis - MIC*    AMPICILLIN <=2 SENSITIVE Sensitive     NITROFURANTOIN <=16 SENSITIVE Sensitive     VANCOMYCIN 1 SENSITIVE Sensitive     * >=100,000 COLONIES/mL ENTEROCOCCUS FAECALIS    Radiology Studies: US Abdomen Complete  Result Date: 08/21/2020 CLINICAL DATA:  Nausea EXAM: ABDOMEN ULTRASOUND COMPLETE COMPARISON:  Ultrasound kidney 10/14/2012., CT abdomen pelvis 09/11/2017 FINDINGS: Gallbladder: No gallstones or wall thickening visualized. No sonographic Murphy sign noted by sonographer. Common bile duct: Diameter: 42mm. Liver: No focal lesion identified. Within normal limits in parenchymal echogenicity. Portal vein is patent on color Doppler imaging with normal direction of blood flow towards the liver. IVC: No abnormality visualized. Pancreas: Visualized portion unremarkable. Spleen: Size and appearance within normal limits. Right Kidney: Poorly visualized.  Length: 9.5 cm. Echogenicity is increased. No mass or hydronephrosis visualized. Left Kidney: Length: 11.6. Echogenicity is increased. No mass at. Moderate hydronephrosis visualized. Abdominal aorta: No aneurysm visualized. Other findings: None. IMPRESSION: 1. Moderate left hydronephrosis. 2. Findings suggestive of chronic renal disease within atrophic right kidney. Please note only partial visualization of the right kidney. 3. Remainder of the abdominal ultrasound is unremarkable. Electronically Signed   By: Iven Finn M.D.   On: 08/21/2020 21:13   NM Myocar Multi W/Spect W/Wall Motion / EF  Result Date: 08/22/2020 CLINICAL DATA:  86 year old male with chest pain EXAM: MYOCARDIAL IMAGING WITH SPECT (REST AND PHARMACOLOGIC-STRESS) GATED LEFT VENTRICULAR WALL MOTION STUDY LEFT VENTRICULAR EJECTION FRACTION TECHNIQUE: Standard myocardial SPECT imaging was performed after resting intravenous injection of 9.4 mCi Tc-71m tetrofosmin. Subsequently, intravenous infusion of Lexiscan was performed under the supervision of the Cardiology staff. At peak effect of the drug, 31.3 mCi Tc-26m tetrofosmin was injected intravenously and standard myocardial SPECT imaging was performed. Quantitative gated imaging was also performed to evaluate left ventricular wall motion, and estimate left ventricular ejection fraction. COMPARISON:  None. FINDINGS: Perfusion: No decreased activity in the left ventricle on stress imaging to suggest reversible ischemia or infarction. Wall Motion: Normal left ventricular wall motion. No left ventricular dilation. Left Ventricular Ejection Fraction: 61 % End diastolic volume 93 ml End systolic volume 36 ml IMPRESSION: 1. No reversible ischemia or infarction. 2. Normal left ventricular wall motion. 3. Left ventricular ejection fraction 61% 4. Non invasive risk stratification*: Low *2012 Appropriate Use Criteria for Coronary Revascularization Focused Update: J Am Coll Cardiol. 2703;50(0):938-182.  http://[email or web address] Electronically Signed   By: Corrie Mckusick D.O.   On: 08/22/2020 16:45   VAS [place] LOWER EXTREMITY VENOUS (DVT)  Result Date: 08/22/2020  Lower Venous DVTStudy Indications: Elevated d-dimer.  Comparison Study: No prior studies. Performing Technologist: Darlin Coco  Examination Guidelines: A complete evaluation includes B-mode imaging, spectral Doppler, color Doppler, and power Doppler as needed of all accessible portions  of each vessel. Bilateral testing is considered an integral part of a complete examination. Limited examinations for reoccurring indications may be performed as noted. The reflux portion of the exam is performed with the patient in reverse Trendelenburg.  +---------+---------------+---------+-----------+----------+--------------+ RIGHT    CompressibilityPhasicitySpontaneityPropertiesThrombus Aging +---------+---------------+---------+-----------+----------+--------------+ CFV      Full           Yes      Yes                                 +---------+---------------+---------+-----------+----------+--------------+ SFJ      Full                                                        +---------+---------------+---------+-----------+----------+--------------+ FV Prox  Full                                                        +---------+---------------+---------+-----------+----------+--------------+ FV Mid   Full                                                        +---------+---------------+---------+-----------+----------+--------------+ FV DistalFull                                                        +---------+---------------+---------+-----------+----------+--------------+ PFV      Full                                                        +---------+---------------+---------+-----------+----------+--------------+ POP      Full           Yes      Yes                                  +---------+---------------+---------+-----------+----------+--------------+ PTV      Full                                                        +---------+---------------+---------+-----------+----------+--------------+ PERO     Full                                                        +---------+---------------+---------+-----------+----------+--------------+   +---------+---------------+---------+-----------+----------+--------------+ LEFT  CompressibilityPhasicitySpontaneityPropertiesThrombus Aging +---------+---------------+---------+-----------+----------+--------------+ CFV      Full           Yes      Yes                                 +---------+---------------+---------+-----------+----------+--------------+ SFJ      Full                                                        +---------+---------------+---------+-----------+----------+--------------+ FV Prox  Full                                                        +---------+---------------+---------+-----------+----------+--------------+ FV Mid   Full                                                        +---------+---------------+---------+-----------+----------+--------------+ FV DistalFull                                                        +---------+---------------+---------+-----------+----------+--------------+ PFV      Full                                                        +---------+---------------+---------+-----------+----------+--------------+ POP      Full           Yes      Yes                                 +---------+---------------+---------+-----------+----------+--------------+ PTV      Full                                                        +---------+---------------+---------+-----------+----------+--------------+ PERO     Full                                                         +---------+---------------+---------+-----------+----------+--------------+     Summary: RIGHT: - There is no evidence of deep vein thrombosis in the lower extremity.  - No cystic structure found in the popliteal fossa.  LEFT: - There is no evidence of deep vein thrombosis in the lower extremity.  - No cystic structure found in the  popliteal fossa.  *See table(s) above for measurements and observations. Electronically signed by Monica Martinez MD on 08/22/2020 at 2:22:43 PM.    Final       Bleu Moisan T. [place]  If 7PM-7AM, please contact night-coverage [email or web address] 08/22/2020, 5:13 PM

## 2020-08-22 NOTE — Progress Notes (Signed)
Kellen T Virgo presented for a nuclear stress test today.  No immediate complications.  Stress imaging is pending at this time.   Preliminary EKG findings may be listed in the chart, but the stress test result will not be finalized until perfusion imaging is complete.   [place], [place]  08/22/2020 10:26 AM

## 2020-08-22 NOTE — Progress Notes (Signed)
Review of nuclear stress test today with results as follows:  FINDINGS: Perfusion: No decreased activity in the left ventricle on stress imaging to suggest reversible ischemia or infarction.  Wall Motion: Normal left ventricular wall motion. No left ventricular dilation.  Left Ventricular Ejection Fraction: 61 %  End diastolic volume 93 ml  End systolic volume 36 ml  IMPRESSION: 1. No reversible ischemia or infarction.  2. Normal left ventricular wall motion.  3. Left ventricular ejection fraction 61%  4. Non invasive risk stratification*: Low  No further recs from Cardiology.  Will sign off.

## 2020-08-22 NOTE — Care Management Obs Status (Signed)
[facility] NOTIFICATION   Patient Details  Name: [name] [name] MRN: [id number] Date of Birth: [birth year]   Medicare Observation Status Notification Given:  Yes    Purcell Mouton, RN 08/22/2020, 12:26 PM

## 2020-08-23 DIAGNOSIS — N179 Acute kidney failure, unspecified: Secondary | ICD-10-CM | POA: Diagnosis not present

## 2020-08-23 DIAGNOSIS — N390 Urinary tract infection, site not specified: Secondary | ICD-10-CM | POA: Diagnosis not present

## 2020-08-23 DIAGNOSIS — N184 Chronic kidney disease, stage 4 (severe): Secondary | ICD-10-CM

## 2020-08-23 DIAGNOSIS — N1330 Unspecified hydronephrosis: Secondary | ICD-10-CM | POA: Diagnosis not present

## 2020-08-23 DIAGNOSIS — N1370 Vesicoureteral-reflux, unspecified: Secondary | ICD-10-CM

## 2020-08-23 DIAGNOSIS — I10 Essential (primary) hypertension: Secondary | ICD-10-CM

## 2020-08-23 DIAGNOSIS — Z8551 Personal history of malignant neoplasm of bladder: Secondary | ICD-10-CM | POA: Diagnosis not present

## 2020-08-23 DIAGNOSIS — R079 Chest pain, unspecified: Secondary | ICD-10-CM | POA: Diagnosis not present

## 2020-08-23 DIAGNOSIS — B952 Enterococcus as the cause of diseases classified elsewhere: Secondary | ICD-10-CM

## 2020-08-23 LAB — RENAL FUNCTION PANEL
Albumin: 3.2 g/dL — ABNORMAL LOW (ref 3.5–5.0)
Anion gap: 10 (ref 5–15)
BUN: 30 mg/dL — ABNORMAL HIGH (ref 8–23)
CO2: 21 mmol/L — ABNORMAL LOW (ref 22–32)
Calcium: 8.5 mg/dL — ABNORMAL LOW (ref 8.9–10.3)
Chloride: 106 mmol/L (ref 98–111)
Creatinine, Ser: 1.96 mg/dL — ABNORMAL HIGH (ref 0.61–1.24)
GFR calc Af Amer: 35 mL/min — ABNORMAL LOW (ref >60)
GFR calc non Af Amer: 30 mL/min — ABNORMAL LOW (ref >60)
Glucose, Bld: 105 mg/dL — ABNORMAL HIGH (ref 70–99)
Phosphorus: 4.8 mg/dL — ABNORMAL HIGH (ref 2.5–4.6)
Potassium: 4 mmol/L (ref 3.5–5.1)
Sodium: 137 mmol/L (ref 135–145)

## 2020-08-23 LAB — CBC
HCT: 36.1 % — ABNORMAL LOW (ref 39.0–52.0)
Hemoglobin: 11.9 g/dL — ABNORMAL LOW (ref 13.0–17.0)
MCH: 29.7 pg (ref 26.0–34.0)
MCHC: 33 g/dL (ref 30.0–36.0)
MCV: 90 fL (ref 80.0–100.0)
Platelets: 272 10*3/uL (ref 150–400)
RBC: 4.01 MIL/uL — ABNORMAL LOW (ref 4.22–5.81)
RDW: 14.6 % (ref 11.5–15.5)
WBC: 7.4 10*3/uL (ref 4.0–10.5)
nRBC: 0 % (ref 0.0–0.2)

## 2020-08-23 LAB — MAGNESIUM: Magnesium: 2.3 mg/dL (ref 1.7–2.4)

## 2020-08-23 MED ORDER — AMOXICILLIN 500 MG PO CAPS
500.0000 mg | ORAL_CAPSULE | Freq: Two times a day (BID) | ORAL | 0 refills | Status: DC
Start: 2020-08-23 — End: 2020-10-22

## 2020-08-23 MED ORDER — AMLODIPINE BESYLATE 10 MG PO TABS: 10.0000 mg | ORAL_TABLET | Freq: Every day | ORAL | 1 refills | Status: AC

## 2020-08-23 NOTE — Plan of Care (Signed)
  Problem: Clinical Measurements: Goal: Ability to maintain clinical measurements within normal limits will improve Outcome: Progressing Goal: Will remain free from infection Outcome: Progressing Goal: Diagnostic test results will improve Outcome: Progressing Goal: Respiratory complications will improve Outcome: Progressing Goal: Cardiovascular complication will be avoided Outcome: Progressing   Problem: Nutrition: Goal: Adequate nutrition will be maintained Outcome: Progressing   Problem: Coping: Goal: Level of anxiety will decrease Outcome: Progressing   Problem: Elimination: Goal: Will not experience complications related to bowel motility Outcome: Progressing Goal: Will not experience complications related to urinary retention Outcome: Progressing   Problem: Safety: Goal: Ability to remain free from injury will improve Outcome: Progressing   Problem: Skin Integrity: Goal: Risk for impaired skin integrity will decrease Outcome: Progressing   Problem: Education: Goal: Knowledge of General Education information will improve Description: Including pain rating scale, medication(s)/side effects and non-pharmacologic comfort measures Outcome: Progressing   Problem: Health Behavior/Discharge Planning: Goal: Ability to manage health-related needs will improve Outcome: Progressing   Problem: Clinical Measurements: Goal: Ability to maintain clinical measurements within normal limits will improve Outcome: Progressing Goal: Will remain free from infection Outcome: Progressing Goal: Diagnostic test results will improve Outcome: Progressing Goal: Respiratory complications will improve Outcome: Progressing Goal: Cardiovascular complication will be avoided Outcome: Progressing   Problem: Activity: Goal: Risk for activity intolerance will decrease Outcome: Progressing   Problem: Nutrition: Goal: Adequate nutrition will be maintained Outcome: Progressing   Problem:  Coping: Goal: Level of anxiety will decrease Outcome: Progressing   Problem: Elimination: Goal: Will not experience complications related to bowel motility Outcome: Progressing Goal: Will not experience complications related to urinary retention Outcome: Progressing   Problem: Pain Managment: Goal: General experience of comfort will improve Outcome: Progressing   Problem: Safety: Goal: Ability to remain free from injury will improve Outcome: Progressing   Problem: Skin Integrity: Goal: Risk for impaired skin integrity will decrease Outcome: Progressing

## 2020-08-23 NOTE — Discharge Summary (Signed)
Physician Discharge Summary  [name] [name] JSE:[id number] DOB: 1931/12/14 DOA: 08/20/2020  PCP: [name], MD  Admit date: 08/20/2020 Discharge date: 08/23/2020  Admitted From: Home Disposition: Home  Recommendations for Outpatient Follow-up:  1. Follow ups as below. 2. Please obtain CBC/BMP/Mag/P in 1 week 3. Please follow up on the following pending results: None  Home Health: None required Equipment/Devices: None required  Discharge Condition: Stable CODE STATUS: Partial code, everything except intubation   Follow-up Information    [name], MD. Schedule an appointment as soon as possible for a visit in 1 week(s).   Specialty: Internal Medicine Contact information: [address] [place] [address] [phone]        Buford Dresser, MD. Schedule an appointment as soon as possible for a visit in 2 week(s).   Specialty: Cardiology Contact information: [address] [place] [address] [phone]               Hospital Course: 86 year old male with history of recurrent bladder papillary cancer s/p TURBT in 2018, and BPH followed by urology, recent UTI started on Macrobid, CKD-3B, HTN and hypothyroidism presenting with chest discomfort with associated diaphoresis, nausea and shortness of breath that woke him up from sleep.   Of note, he was recently started on Macrobid for UTI by his urologist.  In ED, hemodynamically stable.  EKG, serial troponin, BNP and CXR without significant finding.  D-dimer elevated to 4.26.  Pulmonary perfusion scan without significant finding.  CTA was not done due to CKD.  He was admitted for chest pain to rule out ACS.  Cardiology consulted.  ACS excluded by serial troponin, EKG and low risk Lexiscan.  Patient remained free of chest pain.  In regards to UTI, urine culture at urology office and here grew Enterococcus E. Coli.  Antibiotics changed to amoxicillin.  Patient had an abdominal ultrasound that showed moderate left hydronephrosis.  Urology consulted.  CT cystogram revealed left-sided vesicoureteral reflux.  Postvoid residual less than 100 cc.  Cleared for discharge by urology on p.o. amoxicillin for 1 week.  He has upcoming appointment with his urologist next week.   See individual problem list below for more hospital course.  Discharge Diagnoses:  Chest pain: association with dyspnea, diaphoresis and nausea concerning but he describes his pain as sharp and not exertional.  Serial troponin, EKG, BNP, CXR and Lexiscan without significant finding.  Elevated D-dimer but perfusion scan low risk.  LE Doppler negative for DVT. A1c 6.2%.  LDL 63.  Chest pain resolved. -Continue statin and aspirin  Enterococcus faecalis UTI-urine culture at urology office and here with pansensitive Enterococcus. History of bladder cancer s/p TURBT Moderate left-sided hydronephrosis-likely due to vesicoureteral reflux as noted on CT cystogram BPH with LUTS-frequency and sense of incomplete voids -Changed antibiotic to amoxicillin 500 mg twice daily through 08/29/2020 -Continue  home Rapaflo  -Cleared for discharge by urology.  Patient has upcoming appointment with his urologist on 10/7.  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  Hypothyroidism -Continue home Synthroid  Hypomagnesemia/hypophosphatemia-resolved.  Now with mild hyperphosphatemia -Recheck in 1 week.   Body mass index is 28.62 kg/m.            Discharge Exam: Vitals:   08/23/20 0600  08/23/20 1244  BP: 128/67 115/73  Pulse: 65 88  Resp: 18 18  Temp: 98.1 F (36.7 C) 98.6 F (37 C)  SpO2: 95% 99%    GENERAL: No apparent distress.   Nontoxic. HEENT: MMM.  Vision and hearing grossly intact.  NECK: Supple.  No apparent JVD.  RESP:  No IWOB.  Fair aeration bilaterally. CVS:  RRR. Heart sounds normal.  ABD/GI/GU: Bowel sounds present. Soft. Non tender.  MSK/EXT:  Moves extremities. No apparent deformity. No edema.  SKIN: no apparent skin lesion or wound NEURO: Awake, alert and oriented appropriately.  No apparent focal neuro deficit. PSYCH: Calm. Normal affect.  Discharge Instructions  Discharge Instructions    Call MD for:  difficulty breathing, headache or visual disturbances   Complete by: As directed    Call MD for:  persistant dizziness or light-headedness   Complete by: As directed    Call MD for:  severe uncontrolled pain   Complete by: As directed    Call MD for:  temperature >100.4   Complete by: As directed    Diet - low sodium heart healthy   Complete by: As directed    Discharge instructions   Complete by: As directed    It has been a pleasure taking care of you!  You were hospitalized due to chest discomfort that has resolved.  The cause of your chest discomfort is not clear but the tests we have done on your lung and heart are reassuring.   In regards to your UTI, we changed your antibiotic based on your culture.  Please take this prescription, and complete the whole antibiotic course for 1 week.  Go to your follow-up appointment with urologist next week.  We have changed your blood pressure medication from irbesartan to amlodipine to see if your kidney function improves.  Please have your kidney numbers rechecked in about a week.  We also recommend you avoid any over-the-counter pain medications except plain Tylenol.  Please follow your new medication list and the directions of your medications before you take them.   Please go to your hospital follow-up appointments or call to schedule as recommended.   Take care,   Increase activity slowly   Complete by: As directed      Allergies as of  08/23/2020      Reactions   Macrobid [nitrofurantoin] Shortness Of Breath   Ciprofloxacin    REACTION: diarrhea.  Occurred with 750mg  dose.  Has since taken 500mg  doses and has had no reaction/diarrhea.   Cephalexin Rash   Rash on arms      Medication List    STOP taking these medications   aspirin-acetaminophen-caffeine 250-250-65 MG tablet Commonly known as: EXCEDRIN MIGRAINE   irbesartan 300 MG tablet Commonly known as: AVAPRO     TAKE these medications   amLODipine 10 MG tablet Commonly known as: NORVASC Take 1 tablet (10 mg total) by mouth daily.   amoxicillin 500 MG capsule Commonly known as: AMOXIL Take 1 capsule (500 mg total) by mouth 2 (two) times daily.   aspirin 81 MG tablet Take 81 mg by mouth at bedtime.   levothyroxine 50 MCG tablet Commonly known as: SYNTHROID Take 50 mcg by mouth daily before breakfast.   pantoprazole 40 MG tablet Commonly known as: PROTONIX Take 40 mg by mouth at bedtime.   phenazopyridine 200 MG tablet Commonly known as: Pyridium Take 1 tablet (200 mg total) by mouth 3 (three) times daily as needed for pain.   psyllium  58.6 % powder Commonly known as: METAMUCIL Take 1 packet by mouth every evening. WITH MEDS   rosuvastatin 5 MG tablet Commonly known as: CRESTOR Take 5 mg by mouth at bedtime.   silodosin 8 MG Caps capsule Commonly known as: RAPAFLO TAKE ONE CAPSULE BY MOUTH EVERY NIGHT AT BEDTIME What changed: when to take this   VITAMIN C PO Take 1 tablet by mouth 2 (two) times daily.   VITAMIN D3 PO Take 1 capsule by mouth daily.   ZINC PO Take 1 tablet by mouth daily.       Consultations:  Cardiology  Urology  Procedures/Studies:   DG Chest 2 View  Result Date: 08/20/2020 CLINICAL DATA:  Shortness of breath.  Chest pain. EXAM: CHEST - 2 VIEW COMPARISON:  08/14/2018. FINDINGS: Mediastinum hilar structures normal. Mild left base subsegmental atelectasis. No pleural effusion or pneumothorax. Heart size  normal. Degenerative change thoracic spine. IMPRESSION: Mild left base subsegmental atelectasis. Electronically Signed   By: Marcello Moores  Register   On: 08/20/2020 05:36   CT PELVIS WO CONTRAST  Result Date: 08/22/2020 CLINICAL DATA:  Moderate left hydronephrosis on ultrasound. Evaluate for reflux. EXAM: CT PELVIS WITHOUT CONTRAST TECHNIQUE: Multidetector CT imaging of the pelvis was performed following the standard protocol without intravenous contrast. COMPARISON:  09/11/2017 FINDINGS: Urinary Tract: Urinary bladder is distended. Gas in the bladder lumen is compatible with the introduction of contrast material and the presence of a Foley catheter. No focal bladder wall abnormality. Reflux of contrast material into the left ureter is visualized with retrograde flow of contrast up the left ureter to the top of the study which is above the left iliac crest. Central defect in the prostate gland is compatible with reported history of TURP. Bowel:  Diverticular changes noted in the sigmoid colon. Vascular/Lymphatic: There is abdominal aortic atherosclerosis without aneurysm. No pelvic sidewall lymphadenopathy. Reproductive:  TURP defect central prostate gland. Other:  No intraperitoneal free fluid. Musculoskeletal: No worrisome lytic or sclerotic osseous abnormality. Degenerative changes noted in the hips, right greater than left. IMPRESSION: Left-sided vesicoureteral reflux above the level of the left iliac crest which is the cranial extent of this pelvis CT. Electronically Signed   By: Misty Stanley M.D.   On: 08/22/2020 19:59   NM Pulmonary Perfusion  Result Date: 08/20/2020 CLINICAL DATA:  Suspected. EXAM: NUCLEAR MEDICINE PERFUSION LUNG SCAN TECHNIQUE: Perfusion images were obtained in multiple projections after intravenous injection of radiopharmaceutical. Ventilation scans intentionally deferred if perfusion scan and chest x-ray adequate for interpretation during COVID 19 epidemic. RADIOPHARMACEUTICALS:  3.8  mCi Tc-36m MAA IV COMPARISON:  Chest x-ray 08/20/2020. FINDINGS: No prominent perfusion defects noted. Low probability pulmonary embolus. Given the high clinical suspicion for pulmonary embolus bilateral lower extremity color flow duplex Doppler can be obtained as needed. IMPRESSION: No prominent perfusion defects.  Low probability pulmonary embolus. Electronically Signed   By: Marcello Moores  Register   On: 08/20/2020 11:56   US Abdomen Complete  Result Date: 08/21/2020 CLINICAL DATA:  Nausea EXAM: ABDOMEN ULTRASOUND COMPLETE COMPARISON:  Ultrasound kidney 10/14/2012., CT abdomen pelvis 09/11/2017 FINDINGS: Gallbladder: No gallstones or wall thickening visualized. No sonographic Murphy sign noted by sonographer. Common bile duct: Diameter: 50mm. Liver: No focal lesion identified. Within normal limits in parenchymal echogenicity. Portal vein is patent on color Doppler imaging with normal direction of blood flow towards the liver. IVC: No abnormality visualized. Pancreas: Visualized portion unremarkable. Spleen: Size and appearance within normal limits. Right Kidney: Poorly visualized. Length: 9.5 cm. Echogenicity is increased. No  mass or hydronephrosis visualized. Left Kidney: Length: 11.6. Echogenicity is increased. No mass at. Moderate hydronephrosis visualized. Abdominal aorta: No aneurysm visualized. Other findings: None. IMPRESSION: 1. Moderate left hydronephrosis. 2. Findings suggestive of chronic renal disease within atrophic right kidney. Please note only partial visualization of the right kidney. 3. Remainder of the abdominal ultrasound is unremarkable. Electronically Signed   By: Iven Finn M.D.   On: 08/21/2020 21:13   NM Myocar Multi W/Spect W/Wall Motion / EF  Result Date: 08/22/2020 CLINICAL DATA:  86 year old male with chest pain EXAM: MYOCARDIAL IMAGING WITH SPECT (REST AND PHARMACOLOGIC-STRESS) GATED LEFT VENTRICULAR WALL MOTION STUDY LEFT VENTRICULAR EJECTION FRACTION TECHNIQUE: Standard  myocardial SPECT imaging was performed after resting intravenous injection of 9.4 mCi Tc-80m tetrofosmin. Subsequently, intravenous infusion of Lexiscan was performed under the supervision of the Cardiology staff. At peak effect of the drug, 31.3 mCi Tc-15m tetrofosmin was injected intravenously and standard myocardial SPECT imaging was performed. Quantitative gated imaging was also performed to evaluate left ventricular wall motion, and estimate left ventricular ejection fraction. COMPARISON:  None. FINDINGS: Perfusion: No decreased activity in the left ventricle on stress imaging to suggest reversible ischemia or infarction. Wall Motion: Normal left ventricular wall motion. No left ventricular dilation. Left Ventricular Ejection Fraction: 61 % End diastolic volume 93 ml End systolic volume 36 ml IMPRESSION: 1. No reversible ischemia or infarction. 2. Normal left ventricular wall motion. 3. Left ventricular ejection fraction 61% 4. Non invasive risk stratification*: Low *2012 Appropriate Use Criteria for Coronary Revascularization Focused Update: J Am Coll Cardiol. 2376;28(3):151-761. http://content.[email or web address].aspx?articleid=[email or web address] Electronically Signed   By: Corrie Mckusick D.O.   On: 08/22/2020 16:45   VAS [place] LOWER EXTREMITY VENOUS (DVT)  Result Date: 08/22/2020  Lower Venous DVTStudy Indications: Elevated d-dimer.  Comparison Study: No prior studies. Performing Technologist: Darlin Coco  Examination Guidelines: A complete evaluation includes B-mode imaging, spectral Doppler, color Doppler, and power Doppler as needed of all accessible portions of each vessel. Bilateral testing is considered an integral part of a complete examination. Limited examinations for reoccurring indications may be performed as noted. The reflux portion of the exam is performed with the patient in reverse Trendelenburg.  +---------+---------------+---------+-----------+----------+--------------+  RIGHT      Compressibility Phasicity Spontaneity Properties Thrombus Aging  +---------+---------------+---------+-----------+----------+--------------+  CFV       Full            Yes       Yes                                    +---------+---------------+---------+-----------+----------+--------------+  SFJ       Full                                                             +---------+---------------+---------+-----------+----------+--------------+  FV Prox   Full                                                             +---------+---------------+---------+-----------+----------+--------------+  FV Mid    Full                                                             +---------+---------------+---------+-----------+----------+--------------+  FV Distal Full                                                             +---------+---------------+---------+-----------+----------+--------------+  PFV       Full                                                             +---------+---------------+---------+-----------+----------+--------------+  POP       Full            Yes       Yes                                    +---------+---------------+---------+-----------+----------+--------------+  PTV       Full                                                             +---------+---------------+---------+-----------+----------+--------------+  PERO      Full                                                             +---------+---------------+---------+-----------+----------+--------------+   +---------+---------------+---------+-----------+----------+--------------+  LEFT      Compressibility Phasicity Spontaneity Properties Thrombus Aging  +---------+---------------+---------+-----------+----------+--------------+  CFV       Full            Yes       Yes                                    +---------+---------------+---------+-----------+----------+--------------+  SFJ       Full                                                              +---------+---------------+---------+-----------+----------+--------------+  FV Prox   Full                                                             +---------+---------------+---------+-----------+----------+--------------+  FV Mid    Full                                                             +---------+---------------+---------+-----------+----------+--------------+  FV Distal Full                                                             +---------+---------------+---------+-----------+----------+--------------+  PFV       Full                                                             +---------+---------------+---------+-----------+----------+--------------+  POP       Full            Yes       Yes                                    +---------+---------------+---------+-----------+----------+--------------+  PTV       Full                                                             +---------+---------------+---------+-----------+----------+--------------+  PERO      Full                                                             +---------+---------------+---------+-----------+----------+--------------+     Summary: RIGHT: - There is no evidence of deep vein thrombosis in the lower extremity.  - No cystic structure found in the popliteal fossa.  LEFT: - There is no evidence of deep vein thrombosis in the lower extremity.  - No cystic structure found in the popliteal fossa.  *See table(s) above for measurements and observations. Electronically signed by Monica Martinez MD on 08/22/2020 at 2:22:43 PM.    Final         The results of significant diagnostics from this hospitalization (including imaging, microbiology, ancillary and laboratory) are listed below for reference.     Microbiology: Recent Results (from the past 240 hour(s))  Respiratory Panel by RT PCR (Flu A&B, Covid) - Nasopharyngeal Swab     Status: None   Collection Time: 08/20/20  7:05 AM   Specimen:  Nasopharyngeal Swab  Result Value Ref Range Status   SARS Coronavirus 2 by RT PCR NEGATIVE NEGATIVE Final    Comment: (NOTE) SARS-CoV-2 target nucleic acids are NOT DETECTED.  The SARS-CoV-2 RNA is generally detectable in upper respiratoy specimens during the acute phase of infection. The lowest concentration of SARS-CoV-2 viral copies this assay can detect is 131 copies/mL. A negative result does not preclude SARS-Cov-2 infection and should not be used as the sole basis for treatment or other patient management decisions. A negative result may occur with  improper specimen collection/handling, submission of specimen other than nasopharyngeal swab, presence of viral mutation(s) within the areas targeted by this assay, and inadequate number  of viral copies (<131 copies/mL). A negative result must be combined with clinical observations, patient history, and epidemiological information. The expected result is Negative.  Fact Sheet for Patients:  PinkCheek.be  Fact Sheet for Healthcare Providers:  GravelBags.it  This test is no t yet approved or cleared by the [place] FDA and  has been authorized for detection and/or diagnosis of SARS-CoV-2 by FDA under an Emergency Use Authorization (EUA). This EUA will remain  in effect (meaning this test can be used) for the duration of the COVID-19 declaration under Section 564(b)(1) of the Act, 21 U.S.C. section 360bbb-3(b)(1), unless the authorization is terminated or revoked sooner.     Influenza A by PCR NEGATIVE NEGATIVE Final   Influenza B by PCR NEGATIVE NEGATIVE Final    Comment: (NOTE) The Xpert Xpress SARS-CoV-2/FLU/RSV assay is intended as an aid in  the diagnosis of influenza from Nasopharyngeal swab specimens and  should not be used as a sole basis for treatment. Nasal washings and  aspirates are unacceptable for Xpert Xpress SARS-CoV-2/FLU/RSV  testing.  Fact Sheet  for Patients: PinkCheek.be  Fact Sheet for Healthcare Providers: GravelBags.it  This test is not yet approved or cleared by the [place] FDA and  has been authorized for detection and/or diagnosis of SARS-CoV-2 by  FDA under an Emergency Use Authorization (EUA). This EUA will remain  in effect (meaning this test can be used) for the duration of the  Covid-19 declaration under Section 564(b)(1) of the Act, 21  U.S.C. section 360bbb-3(b)(1), unless the authorization is  terminated or revoked. Performed at [facility], [place] [address] [place], [place] [address]   Culture, Urine     Status: Abnormal   Collection Time: 08/20/20  9:56 PM   Specimen: Urine, Clean Catch  Result Value Ref Range Status   Specimen Description   Final    URINE, CLEAN CATCH Performed at [facility], [place] [address], [place], [place] [address]    Special Requests   Final    NONE Performed at [facility], [place] [address], [place], [place] [address]    Culture >=100,000 COLONIES/mL ENTEROCOCCUS FAECALIS (A)  Final   Report Status 08/22/2020 FINAL  Final   Organism ID, Bacteria ENTEROCOCCUS FAECALIS (A)  Final      Susceptibility   Enterococcus faecalis - MIC*    AMPICILLIN <=2 SENSITIVE Sensitive     NITROFURANTOIN <=16 SENSITIVE Sensitive     VANCOMYCIN 1 SENSITIVE Sensitive     * >=100,000 COLONIES/mL ENTEROCOCCUS FAECALIS     Labs: BNP (last 3 results) Recent Labs    08/20/20 1908  BNP 96.2   Basic Metabolic Panel: Recent Labs  Lab 08/20/20 0628 08/20/20 1138 08/22/20 0041 08/23/20 0524  NA 137 142 139 137  K 3.7 4.1 3.9 4.0  CL 105 109 110 106  CO2 19*  --  20* 21*  GLUCOSE 154* 129* 186* 105*  BUN 31* 30* 38* 30*  CREATININE 2.03* 2.00* 2.28* 1.96*  CALCIUM 9.3  --  8.9 8.5*  MG  --   --  1.6* 2.3  PHOS  --   --  1.8* 4.8*   Liver Function Tests: Recent Labs   Lab 08/22/20 0041 08/23/20 0524  AST 23  --   ALT 25  --   ALKPHOS 71  --   BILITOT 0.8  --   PROT 6.6  --   ALBUMIN 3.3*   3.5 3.2*   Recent Labs  Lab 08/21/20 1233  LIPASE 33  AMYLASE 86   No results for input(s): AMMONIA in the last 168 hours. CBC: Recent Labs  Lab 08/20/20 0713 08/20/20 1138 08/22/20 0041 08/23/20 0524  WBC 14.6*  --  9.0 7.4  NEUTROABS 13.6*  --   --   --   HGB 12.8* 12.6* 11.9* 11.9*  HCT 38.0* 37.0* 35.7* 36.1*  MCV 88.8  --  89.7 90.0  PLT 258  --  277 272   Cardiac Enzymes: No results for input(s): CKTOTAL, CKMB, CKMBINDEX, TROPONINI in the last 168 hours. BNP: Invalid input(s): POCBNP CBG: No results for input(s): GLUCAP in the last 168 hours. D-Dimer No results for input(s): DDIMER in the last 72 hours. Hgb A1c No results for input(s): HGBA1C in the last 72 hours. Lipid Profile Recent Labs    08/20/20 1908  CHOL 117  HDL 43  LDLCALC 63  TRIG 54  CHOLHDL 2.7   Thyroid function studies No results for input(s): TSH, T4TOTAL, T3FREE, THYROIDAB in the last 72 hours.  Invalid input(s): FREET3 Anemia work up No results for input(s): VITAMINB12, FOLATE, FERRITIN, TIBC, IRON, RETICCTPCT in the last 72 hours. Urinalysis    Component Value Date/Time   COLORURINE YELLOW 08/20/2020 2156   APPEARANCEUR CLOUDY (A) 08/20/2020 2156   LABSPEC 1.010 08/20/2020 2156   PHURINE 5.0 08/20/2020 2156   GLUCOSEU NEGATIVE 08/20/2020 2156   HGBUR LARGE (A) 08/20/2020 2156   [place] NEGATIVE 08/20/2020 2156   [place] 08/20/2020 2156   PROTEINUR 30 (A) 08/20/2020 2156   NITRITE NEGATIVE 08/20/2020 2156   LEUKOCYTESUR LARGE (A) 08/20/2020 2156   Sepsis Labs Invalid input(s): PROCALCITONIN,  WBC,  LACTICIDVEN   Time coordinating discharge: 35 minutes  SIGNED:  Mercy Riding, MD  [facility] 08/23/2020, 3:58 PM  If 7PM-7AM, please contact night-coverage [email or web address]

## 2020-08-23 NOTE — Progress Notes (Signed)
Subjective: Pain controlled. No nausea or emesis. Tolerating foley.  Objective: Vital signs in last 24 hours: Temp:  [97.6 F (36.4 C)-98.2 F (36.8 C)] 98.1 F (36.7 C) (10/01 0600) Pulse Rate:  [62-77] 65 (10/01 0600) Resp:  [18-19] 18 (10/01 0600) BP: (115-139)/(61-81) 128/67 (10/01 0600) SpO2:  [95 %-99 %] 95 % (10/01 0600)  Intake/Output from previous day: 09/30 0701 - 10/01 0700 In: 159.1 [I.V.:3.5; IV Piggyback:155.6] Out: 800 [Urine:800] Intake/Output this shift: No intake/output data recorded.  Physical Exam:  General: Alert and oriented CV: RRR Lungs: Clear Abdomen: Soft, ND, NT Ext: NT, No erythema  Lab Results: Recent Labs    08/20/20 1138 08/22/20 0041 08/23/20 0524  HGB 12.6* 11.9* 11.9*  HCT 37.0* 35.7* 36.1*   BMET Recent Labs    08/22/20 0041 08/23/20 0524  NA 139 137  K 3.9 4.0  CL 110 106  CO2 20* 21*  GLUCOSE 186* 105*  BUN 38* 30*  CREATININE 2.28* 1.96*  CALCIUM 8.9 8.5*     Studies/Results: CT PELVIS WO CONTRAST  Result Date: 08/22/2020 CLINICAL DATA:  Moderate left hydronephrosis on ultrasound. Evaluate for reflux. EXAM: CT PELVIS WITHOUT CONTRAST TECHNIQUE: Multidetector CT imaging of the pelvis was performed following the standard protocol without intravenous contrast. COMPARISON:  09/11/2017 FINDINGS: Urinary Tract: Urinary bladder is distended. Gas in the bladder lumen is compatible with the introduction of contrast material and the presence of a Foley catheter. No focal bladder wall abnormality. Reflux of contrast material into the left ureter is visualized with retrograde flow of contrast up the left ureter to the top of the study which is above the left iliac crest. Central defect in the prostate gland is compatible with reported history of TURP. Bowel:  Diverticular changes noted in the sigmoid colon. Vascular/Lymphatic: There is abdominal aortic atherosclerosis without aneurysm. No pelvic sidewall lymphadenopathy.  Reproductive:  TURP defect central prostate gland. Other:  No intraperitoneal free fluid. Musculoskeletal: No worrisome lytic or sclerotic osseous abnormality. Degenerative changes noted in the hips, right greater than left. IMPRESSION: Left-sided vesicoureteral reflux above the level of the left iliac crest which is the cranial extent of this pelvis CT. Electronically Signed   By: Misty Stanley M.D.   On: 08/22/2020 19:59   US Abdomen Complete  Result Date: 08/21/2020 CLINICAL DATA:  Nausea EXAM: ABDOMEN ULTRASOUND COMPLETE COMPARISON:  Ultrasound kidney 10/14/2012., CT abdomen pelvis 09/11/2017 FINDINGS: Gallbladder: No gallstones or wall thickening visualized. No sonographic Murphy sign noted by sonographer. Common bile duct: Diameter: 50mm. Liver: No focal lesion identified. Within normal limits in parenchymal echogenicity. Portal vein is patent on color Doppler imaging with normal direction of blood flow towards the liver. IVC: No abnormality visualized. Pancreas: Visualized portion unremarkable. Spleen: Size and appearance within normal limits. Right Kidney: Poorly visualized. Length: 9.5 cm. Echogenicity is increased. No mass or hydronephrosis visualized. Left Kidney: Length: 11.6. Echogenicity is increased. No mass at. Moderate hydronephrosis visualized. Abdominal aorta: No aneurysm visualized. Other findings: None. IMPRESSION: 1. Moderate left hydronephrosis. 2. Findings suggestive of chronic renal disease within atrophic right kidney. Please note only partial visualization of the right kidney. 3. Remainder of the abdominal ultrasound is unremarkable. Electronically Signed   By: Iven Finn M.D.   On: 08/21/2020 21:13   NM Myocar Multi W/Spect W/Wall Motion / EF  Result Date: 08/22/2020 CLINICAL DATA:  86 year old male with chest pain EXAM: MYOCARDIAL IMAGING WITH SPECT (REST AND PHARMACOLOGIC-STRESS) GATED LEFT VENTRICULAR WALL MOTION STUDY LEFT VENTRICULAR EJECTION FRACTION TECHNIQUE: Standard  myocardial SPECT imaging was performed after resting intravenous injection of 9.4 mCi Tc-2m tetrofosmin. Subsequently, intravenous infusion of Lexiscan was performed under the supervision of the Cardiology staff. At peak effect of the drug, 31.3 mCi Tc-24m tetrofosmin was injected intravenously and standard myocardial SPECT imaging was performed. Quantitative gated imaging was also performed to evaluate left ventricular wall motion, and estimate left ventricular ejection fraction. COMPARISON:  None. FINDINGS: Perfusion: No decreased activity in the left ventricle on stress imaging to suggest reversible ischemia or infarction. Wall Motion: Normal left ventricular wall motion. No left ventricular dilation. Left Ventricular Ejection Fraction: 61 % End diastolic volume 93 ml End systolic volume 36 ml IMPRESSION: 1. No reversible ischemia or infarction. 2. Normal left ventricular wall motion. 3. Left ventricular ejection fraction 61% 4. Non invasive risk stratification*: Low *2012 Appropriate Use Criteria for Coronary Revascularization Focused Update: J Am Coll Cardiol. 1610;96(0):454-098. http://content.[email or web address]?articleid=[email or web address] Electronically Signed   By: Corrie Mckusick D.O.   On: 08/22/2020 16:45   VAS [place] LOWER EXTREMITY VENOUS (DVT)  Result Date: 08/22/2020  Lower Venous DVTStudy Indications: Elevated d-dimer.  Comparison Study: No prior studies. Performing Technologist: Darlin Coco  Examination Guidelines: A complete evaluation includes B-mode imaging, spectral Doppler, color Doppler, and power Doppler as needed of all accessible portions of each vessel. Bilateral testing is considered an integral part of a complete examination. Limited examinations for reoccurring indications may be performed as noted. The reflux portion of the exam is performed with the patient in reverse Trendelenburg.  +---------+---------------+---------+-----------+----------+--------------+ RIGHT     CompressibilityPhasicitySpontaneityPropertiesThrombus Aging +---------+---------------+---------+-----------+----------+--------------+ CFV      Full           Yes      Yes                                 +---------+---------------+---------+-----------+----------+--------------+ SFJ      Full                                                        +---------+---------------+---------+-----------+----------+--------------+ FV Prox  Full                                                        +---------+---------------+---------+-----------+----------+--------------+ FV Mid   Full                                                        +---------+---------------+---------+-----------+----------+--------------+ FV DistalFull                                                        +---------+---------------+---------+-----------+----------+--------------+ PFV      Full                                                        +---------+---------------+---------+-----------+----------+--------------+  POP      Full           Yes      Yes                                 +---------+---------------+---------+-----------+----------+--------------+ PTV      Full                                                        +---------+---------------+---------+-----------+----------+--------------+ PERO     Full                                                        +---------+---------------+---------+-----------+----------+--------------+   +---------+---------------+---------+-----------+----------+--------------+ LEFT     CompressibilityPhasicitySpontaneityPropertiesThrombus Aging +---------+---------------+---------+-----------+----------+--------------+ CFV      Full           Yes      Yes                                 +---------+---------------+---------+-----------+----------+--------------+ SFJ      Full                                                         +---------+---------------+---------+-----------+----------+--------------+ FV Prox  Full                                                        +---------+---------------+---------+-----------+----------+--------------+ FV Mid   Full                                                        +---------+---------------+---------+-----------+----------+--------------+ FV DistalFull                                                        +---------+---------------+---------+-----------+----------+--------------+ PFV      Full                                                        +---------+---------------+---------+-----------+----------+--------------+ POP      Full           Yes      Yes                                 +---------+---------------+---------+-----------+----------+--------------+  PTV      Full                                                        +---------+---------------+---------+-----------+----------+--------------+ PERO     Full                                                        +---------+---------------+---------+-----------+----------+--------------+     Summary: RIGHT: - There is no evidence of deep vein thrombosis in the lower extremity.  - No cystic structure found in the popliteal fossa.  LEFT: - There is no evidence of deep vein thrombosis in the lower extremity.  - No cystic structure found in the popliteal fossa.  *See table(s) above for measurements and observations. Electronically signed by Monica Martinez MD on 08/22/2020 at 2:22:43 PM.    Final     Assessment/Plan: 1. Left hydronephrosis: Renal bladder ultrasound 08/13/2020 with moderate left hydronephrosis.  Creatinine 2.28 on 08/22/2020 from a baseline of 2.  PVR only 80 mL.  Cystogram 9/30 with evidence of L vesicoureteral reflux. 2.  Urinary tract infection: Urine culture 08/15/2020 with greater than 100,000 3.  History of high-grade TA urothelial carcinoma  the bladder.  Most recently status post bladder biopsy on 06/25/2020 that revealed low-grade papillary urothelial carcinoma. 4.  AKI on CKD: Creatinine 2.28 on 08/22/2020 from baseline of 2  -Cystogram 9/30 with left VUR. As reflux present, he does not have left ureteral obstruction. Hydronephrosis from reflux. Pt emptying bladder well yesterday with low volume PVRs in 80s.  -Foley catheter placed last night for cystogram. Discussed with RN this AM to remove. VT and check PVRs. -Continue abx. -F/u in office as scheduled for [facility] -Will sign off. Please call with questions   LOS: 0 days   Janith Lima 08/23/2020, 7:28 AM [facility]  Pager: [phone]

## 2020-08-23 NOTE — Progress Notes (Signed)
AVS given to patient and explained at the bedside. Medications and follow up appointments have been explained with pt verbalizing understanding.  

## 2020-08-26 ENCOUNTER — Telehealth: Payer: Self-pay | Admitting: Neurology

## 2020-08-26 NOTE — Telephone Encounter (Signed)
Pt called, was in the hospital for 5 nights and was unable to use my CPAP machine. Started using lCPAP machine 2 days ago. Want to make sure have not been kicked out of the program. Would like a call back.

## 2020-08-26 NOTE — Telephone Encounter (Signed)
Called the patient back to advise that he is ok that he was unable to use the machine while in hospital. Now that back home just continue to use. Pt verbalized understanding.

## 2020-08-29 ENCOUNTER — Ambulatory Visit: Payer: Medicare Other | Admitting: Cardiology

## 2020-08-29 ENCOUNTER — Other Ambulatory Visit: Payer: Self-pay

## 2020-08-29 ENCOUNTER — Encounter: Payer: Self-pay | Admitting: Cardiology

## 2020-08-29 VITALS — BP 155/67 | HR 84 | Ht 68.5 in | Wt 194.8 lb

## 2020-08-29 DIAGNOSIS — Z09 Encounter for follow-up examination after completed treatment for conditions other than malignant neoplasm: Secondary | ICD-10-CM

## 2020-08-29 DIAGNOSIS — R42 Dizziness and giddiness: Secondary | ICD-10-CM

## 2020-08-29 DIAGNOSIS — G4730 Sleep apnea, unspecified: Secondary | ICD-10-CM | POA: Diagnosis not present

## 2020-08-29 DIAGNOSIS — E785 Hyperlipidemia, unspecified: Secondary | ICD-10-CM | POA: Diagnosis not present

## 2020-08-29 DIAGNOSIS — I10 Essential (primary) hypertension: Secondary | ICD-10-CM | POA: Diagnosis not present

## 2020-08-29 DIAGNOSIS — Z7189 Other specified counseling: Secondary | ICD-10-CM

## 2020-08-29 DIAGNOSIS — C678 Malignant neoplasm of overlapping sites of bladder: Secondary | ICD-10-CM | POA: Diagnosis not present

## 2020-08-29 DIAGNOSIS — R8271 Bacteriuria: Secondary | ICD-10-CM | POA: Diagnosis not present

## 2020-08-29 NOTE — Progress Notes (Signed)
Cardiology Office Note:    Date:  08/29/2020   ID:  [name] [name], DOB 03-19-1932, MRN [id number]  PCP:  [name], MD  Cardiologist:  Buford Dresser, MD  Referring MD: [name], MD   CC: transition of care post hospital discharge follow up  History of Present Illness:    [name] [name] is a 86 y.o. male with a hx of sleep apnea on BiPAP, bladder cancer who is seen for follow up today. I initially met him 04/10/20  as a new consult at the request of [name], MD for the evaluation and management of lightheadedness and palpitations.  Today: Here for post hospital discharge follow up. Hospital stay and discharge summary reviewed today. Full medication reconciliation done today.  Presented 08/20/20 with chest pain, nausea, diaphoresis, and shortness of breath that woke him up from sleep. HsTn, ECG unremarkable. Lexiscan nuclear low risk. He was discharged on 08/23/20. D-dimer was elevated, but VQ low risk, DVT ultrasound negative. Treated for UTI.  He saw his urologist earlier today. Fell asleep in between visits, work up and felt a little lightheaded, now resolved. These are like his chronic recurring episodes, common after reading and then dozing off, when he wakes up he feels lightheaded.  Noted he took his first pill for his UTI (nitrofurantoin) at 7 PM 9/27, woke up at 2 AM 9/28, felt lightheaded, anxious, sweating, shaking, like he couldn't take a deep breath. Denies chest pain, felt more like a general pressure throughout his chest that made it tough to inhale. Sleeps with CPAP but doesn't nap with it. Will try napping with it and see if that resolves. Uses it religiously at night.   No recurrence of symptoms. Has overall felt tired but doing ok. Denies chest pain, shortness of breath at rest or with normal exertion. No PND, orthopnea, LE edema or unexpected weight gain. No syncope or palpitations.  Checks blood pressure at home, has been  ~130/60s-70s. Elevation today not typical. Can sometimes have BP in the 110s/60s. Had to change from ARB to amlodipine due to kidney disease. Has pending appt with nephrology.  Past Medical History:  Diagnosis Date  . Arthritis    lower back bulging disc, hips, knees, thumbs, shoulder  . Bladder cancer ([place]) UROLOGIST-  DR MCKENZIE   S/P TURBT 08-16-2017  . BPH (benign prostatic hyperplasia)   . ckd stage 3w    STAGE 3  B CKD per lov 06-14-2020 dr Rica Mast [facility] on chart  . Elevated PSA    RESOLVED SINCE 2018  . Fatigue    MIDDLE OF DAY ON OCCASION  . Frequency of urination   . GERD (gastroesophageal reflux disease)    on protonix  . Hematuria    RESOLVED  . History of colon polyps   . History of dysphagia resolved  . HLD (hyperlipidemia)   . HOH (hard of hearing)    slightly hoh  . HTN (hypertension)   . Hypothyroidism   . Incontinence of urine    only wears protection if he's planning to be in public for an extended amt of time  . Recurrent bladder papillary carcinoma ([place]) hx 2012 and 2014-- urologist- dr Alyson Ingles   s/p  TURBT 08-16-2017  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  . Shoulder bursitis    undiagnosed. painful and limited ROM on left, RESOLVED  . Sleep apnea    uses cpap  . Weakness of extremity    left knee  .  Wears glasses     Past Surgical History:  Procedure Laterality Date  . BCG X 2     AFTER LAST 2 BLADDER TUMOR REMOVALS  . COLONSCOPY  YRS AGO  . CYSTOSCOPY W/ URETERAL STENT PLACEMENT Bilateral 08/16/2017   Procedure: CYSTOSCOPY WITH RETROGRADE PYELOGRAM/URETERAL STENT PLACEMENT;  Surgeon: Cleon Gustin, MD;  Location[facility];  Service: Urology;  Laterality: Bilateral;  . CYSTOSCOPY WITH BIOPSY N/A 06/25/2020   Procedure: CYSTOSCOPY FULGURATION   BLADDER  BIOPSY;  Surgeon: Robley Fries, MD;  Location: [facility];  Service: Urology;  Laterality: N/A;  . CYSTOSCOPY  WITH FULGERATION N/A 12/08/2019   Procedure: CYSTOSCOPY WITH FULGERATION;  Surgeon: Cleon Gustin, MD;  Location: [facility];  Service: Urology;  Laterality: N/A;  . KNEE ARTHROSCOPY Left 06/10/2011  . PROSTATE BIOPSY  2000  . TRANSURETHRAL RESECTION OF BLADDER TUMOR N/A 08/16/2017   Procedure: TRANSURETHRAL RESECTION OF BLADDER TUMOR (TURBT);  Surgeon: Cleon Gustin, MD;  Location: [facility];  Service: Urology;  Laterality: N/A;  . TRANSURETHRAL RESECTION OF BLADDER TUMOR  12-09-2012   dr Alona Bene [facility]   and TURP  . TRANSURETHRAL RESECTION OF BLADDER TUMOR N/A 09/20/2017   Procedure: TRANSURETHRAL RESECTION OF BLADDER TUMOR (TURBT);  Surgeon: Cleon Gustin, MD;  Location: [facility];  Service: Urology;  Laterality: N/A;  . TRANSURETHRAL RESECTION OF BLADDER TUMOR N/A 12/08/2019   Procedure: TRANSURETHRAL RESECTION OF BLADDER TUMOR;  Surgeon: Cleon Gustin, MD;  Location: [facility];  Service: Urology;  Laterality: N/A;  1 HR  . TRANSURETHRAL RESECTION OF PROSTATE  2011  . UPPER GI ENDOSCOPY  YRS AGO    Current Medications: Current Outpatient Medications on File Prior to Visit  Medication Sig  . amLODipine (NORVASC) 10 MG tablet Take 1 tablet (10 mg total) by mouth daily.  Marland Kitchen amoxicillin (AMOXIL) 500 MG capsule Take 1 capsule (500 mg total) by mouth 2 (two) times daily.  . Ascorbic Acid (VITAMIN C PO) Take 1 tablet by mouth 2 (two) times daily.  Marland Kitchen aspirin 81 MG tablet Take 81 mg by mouth at bedtime.   . Cholecalciferol (VITAMIN D3 PO) Take 1 capsule by mouth daily.  Marland Kitchen levothyroxine (SYNTHROID, LEVOTHROID) 50 MCG tablet Take 50 mcg by mouth daily before breakfast.   . Multiple Vitamins-Minerals (ZINC PO) Take 1 tablet by mouth daily.  . pantoprazole (PROTONIX) 40 MG tablet Take 40 mg by mouth at bedtime.   . phenazopyridine (PYRIDIUM) 200 MG tablet Take 1 tablet (200 mg total) by mouth 3 (three) times  daily as needed for pain.  Marland Kitchen psyllium (METAMUCIL) 58.6 % powder Take 1 packet by mouth every evening. WITH MEDS  . rosuvastatin (CRESTOR) 5 MG tablet Take 5 mg by mouth at bedtime.  . silodosin (RAPAFLO) 8 MG CAPS capsule TAKE ONE CAPSULE BY MOUTH EVERY NIGHT AT BEDTIME (Patient taking differently: Take 8 mg by mouth daily. )   No current facility-administered medications on file prior to visit.     Allergies:   Macrobid [nitrofurantoin], Ciprofloxacin, and Cephalexin   Social History   Tobacco Use  . Smoking status: Never Smoker  . Smokeless tobacco: Never Used  Vaping Use  . Vaping Use: Never used  Substance Use Topics  . Alcohol use: Yes    Alcohol/week: 1.0 - 2.0 standard drink    Types: 1 - 2 Glasses of wine per week    Comment: occa wine  .  Drug use: No    Family History: family history includes Alzheimer's disease in his mother; Breast cancer in his mother; Emphysema in his father; Hypertension in his mother; Other in his mother. son had "veins cleaned out" 03/2020. Brother with history of heart disease. Mother and mat gma had heart issues as well.  ROS:   Please see the history of present illness.  Additional pertinent ROS otherwise unremarkable.   EKGs/Labs/Other Studies Reviewed:    The following studies were reviewed today: Lexiscan nuclear stress test 08/22/20 IMPRESSION: 1. No reversible ischemia or infarction.  2. Normal left ventricular wall motion.  3. Left ventricular ejection fraction 61%  4. Non invasive risk stratification*: Low  Echo 05/03/20 1. Left ventricular ejection fraction, by estimation, is 55 to 60%. The  left ventricle has normal function. The left ventricle has no regional  wall motion abnormalities. There is mild concentric left ventricular  hypertrophy. Left ventricular diastolic  parameters were normal.  2. Right ventricular systolic function is normal. The right ventricular  size is normal.  3. Left atrial size was mildly  dilated.  4. The mitral valve is normal in structure. Trivial mitral valve  regurgitation. No evidence of mitral stenosis.  5. The aortic valve is tricuspid. Aortic valve regurgitation is mild.  Mild aortic valve sclerosis is present, with no evidence of aortic valve  stenosis.  6. There is mild dilatation of the ascending aorta measuring 39 mm.  7. The inferior vena cava is normal in size with greater than 50%  respiratory variability, suggesting right atrial pressure of 3 mmHg.   ETT 05/24/20  Blood pressure demonstrated a hypertensive response to exercise.  Upsloping ST segment depression ST segment depression was noted during stress in the III, II, aVF, V6, V5 and V4 leads.  Negative, adequate stress test. Baseline Vitals  Rest HR 71 bpm    Rest BP 161/83 mmHg    Exercise Time  Exercise duration (min) 6 min    Exercise duration (sec) 0 sec    Peak Stress Vitals  Peak HR 144 bpm    Peak BP 217/89 mmHg    Exercise Data  MPHR 135 bpm    Percent HR 106 %    Estimated workload 7 METS     EKG:  EKG is personally reviewed.  The ekg ordered 08/20/20 demonstrates sinus tachycardia at 105 bpm  Recent Labs: 08/20/2020: B Natriuretic Peptide 87.5 08/22/2020: ALT 25 08/23/2020: BUN 30; Creatinine, Ser 1.96; Hemoglobin 11.9; Magnesium 2.3; Platelets 272; Potassium 4.0; Sodium 137  Recent Lipid Panel    Component Value Date/Time   CHOL 117 08/20/2020 1908   TRIG 54 08/20/2020 1908   HDL 43 08/20/2020 1908   CHOLHDL 2.7 08/20/2020 1908   VLDL 11 08/20/2020 1908   LDLCALC 63 08/20/2020 1908    Physical Exam:    VS:  BP (!) 155/67   Pulse 84   Ht 5' 8.5" (1.74 m)   Wt 194 lb 12.8 oz (88.4 kg)   SpO2 98%   BMI 29.19 kg/m     Wt Readings from Last 3 Encounters:  08/29/20 194 lb 12.8 oz (88.4 kg)  08/20/20 191 lb (86.6 kg)  06/25/20 193 lb 12.8 oz (87.9 kg)    GEN: Well nourished, well developed in no acute distress HEENT: Normal, moist mucous membranes NECK: No  JVD CARDIAC: regular rhythm, normal S1 and S2, no rubs or gallops. No murmur. VASCULAR: Radial and DP pulses 2+ bilaterally. No carotid bruits RESPIRATORY:  Clear  to auscultation without rales, wheezing or rhonchi  ABDOMEN: Soft, non-tender, non-distended MUSCULOSKELETAL:  Ambulates independently SKIN: Warm and dry, no edema NEUROLOGIC:  Alert and oriented x 3. No focal neuro deficits noted. PSYCHIATRIC:  Normal affect   ASSESSMENT:    1. Hospital discharge follow-up   2. Episodic lightheadedness   3. Essential hypertension   4. Sleep apnea treated with nocturnal BiPAP   5. Hyperlipidemia, unspecified hyperlipidemia type   6. Cardiac risk counseling    PLAN:    Post hospital follow up for pressure, shortness of breath: transition of care appointment within 7 days of discharge -inpatient workup unremarkable -low risk stress test -low suspicion for cardiac etiology  Lightheadedness, fatigue, shortness of breath -workup to date does not support cardiac cause -recommended that he trial napping with his CPAP on, as most of his symptoms are after a nap  Hypertension: -now on amlodipine given renal issues  Hyperlipidemia, unspecified: -continue rosuvastatin  Cardiac risk counseling and prevention recommendations: -recommend heart healthy/Mediterranean diet, with whole grains, fruits, vegetable, fish, lean meats, nuts, and olive oil. Limit salt. -recommend moderate walking, 3-5 times/week for 30-50 minutes each session. Aim for at least 150 minutes.week. Goal should be pace of 3 miles/hours, or walking 1.5 miles in 30 minutes -recommend avoidance of tobacco products. Avoid excess alcohol.  -on aspirin for primary prevention, on hold for procedure. No issues with this. We discussed stopping aspirin given age, indication, but he would like to continue  Plan for follow up: 6 mos or sooner as needed  Buford Dresser, MD, PhD [place]  [facility]    Medication  Adjustments/Labs and Tests Ordered: Current medicines are reviewed at length with the patient today.  Concerns regarding medicines are outlined above.  No orders of the defined types were placed in this encounter.  No orders of the defined types were placed in this encounter.   Patient Instructions  Medication Instructions:  No Changes In Medications at this time.  *If you need a refill on your cardiac medications before your next appointment, please call your pharmacy*  Lab Work: None Ordered At This Time.  If you have labs (blood work) drawn today and your tests are completely normal, you will receive your results only by: Marland Kitchen MyChart Message (if you have MyChart) OR . A paper copy in the mail If you have any lab test that is abnormal or we need to change your treatment, we will call you to review the results.  Testing/Procedures: None Ordered At This Time.   Follow-Up: At [facility], you and your health needs are our priority.  As part of our continuing mission to provide you with exceptional heart care, we have created designated Provider Care Teams.  These Care Teams include your primary Cardiologist (physician) and Advanced Practice Providers (APPs -  Physician Assistants and Nurse Practitioners) who all work together to provide you with the care you need, when you need it.  Your next appointment:   6 month(s)  The format for your next appointment:   In Person  Provider:   Buford Dresser, MD    Signed, Buford Dresser, MD PhD 08/29/2020    [place]

## 2020-08-29 NOTE — Patient Instructions (Signed)
Medication Instructions:  No Changes In Medications at this time.  *If you need a refill on your cardiac medications before your next appointment, please call your pharmacy*  Lab Work: None Ordered At This Time.  If you have labs (blood work) drawn today and your tests are completely normal, you will receive your results only by: Marland Kitchen MyChart Message (if you have MyChart) OR . A paper copy in the mail If you have any lab test that is abnormal or we need to change your treatment, we will call you to review the results.  Testing/Procedures: None Ordered At This Time.   Follow-Up: At [facility], you and your health needs are our priority.  As part of our continuing mission to provide you with exceptional heart care, we have created designated Provider Care Teams.  These Care Teams include your primary Cardiologist (physician) and Advanced Practice Providers (APPs -  Physician Assistants and Nurse Practitioners) who all work together to provide you with the care you need, when you need it.  Your next appointment:   6 month(s)  The format for your next appointment:   In Person  Provider:   Buford Dresser, MD

## 2020-08-30 DIAGNOSIS — R5383 Other fatigue: Secondary | ICD-10-CM | POA: Diagnosis not present

## 2020-09-02 DIAGNOSIS — N3000 Acute cystitis without hematuria: Secondary | ICD-10-CM | POA: Diagnosis not present

## 2020-09-02 DIAGNOSIS — R0789 Other chest pain: Secondary | ICD-10-CM | POA: Diagnosis not present

## 2020-09-02 DIAGNOSIS — Z789 Other specified health status: Secondary | ICD-10-CM | POA: Diagnosis not present

## 2020-09-05 DIAGNOSIS — C678 Malignant neoplasm of overlapping sites of bladder: Secondary | ICD-10-CM | POA: Diagnosis not present

## 2020-09-05 DIAGNOSIS — Z5111 Encounter for antineoplastic chemotherapy: Secondary | ICD-10-CM | POA: Diagnosis not present

## 2020-09-11 DIAGNOSIS — N3000 Acute cystitis without hematuria: Secondary | ICD-10-CM | POA: Diagnosis not present

## 2020-09-11 DIAGNOSIS — C678 Malignant neoplasm of overlapping sites of bladder: Secondary | ICD-10-CM | POA: Diagnosis not present

## 2020-10-14 DIAGNOSIS — C674 Malignant neoplasm of posterior wall of bladder: Secondary | ICD-10-CM | POA: Diagnosis not present

## 2020-10-14 DIAGNOSIS — R350 Frequency of micturition: Secondary | ICD-10-CM | POA: Diagnosis not present

## 2020-10-15 ENCOUNTER — Other Ambulatory Visit: Payer: Self-pay | Admitting: Urology

## 2020-10-16 DIAGNOSIS — R319 Hematuria, unspecified: Secondary | ICD-10-CM | POA: Diagnosis not present

## 2020-10-16 DIAGNOSIS — N2581 Secondary hyperparathyroidism of renal origin: Secondary | ICD-10-CM | POA: Diagnosis not present

## 2020-10-16 DIAGNOSIS — R809 Proteinuria, unspecified: Secondary | ICD-10-CM | POA: Diagnosis not present

## 2020-10-16 DIAGNOSIS — I129 Hypertensive chronic kidney disease with stage 1 through stage 4 chronic kidney disease, or unspecified chronic kidney disease: Secondary | ICD-10-CM | POA: Diagnosis not present

## 2020-10-16 DIAGNOSIS — N1832 Chronic kidney disease, stage 3b: Secondary | ICD-10-CM | POA: Diagnosis not present

## 2020-10-21 ENCOUNTER — Encounter (HOSPITAL_BASED_OUTPATIENT_CLINIC_OR_DEPARTMENT_OTHER): Payer: Self-pay | Admitting: Urology

## 2020-10-22 ENCOUNTER — Encounter (HOSPITAL_BASED_OUTPATIENT_CLINIC_OR_DEPARTMENT_OTHER): Payer: Self-pay | Admitting: Urology

## 2020-10-22 ENCOUNTER — Other Ambulatory Visit: Payer: Self-pay

## 2020-10-22 DIAGNOSIS — Z01818 Encounter for other preprocedural examination: Secondary | ICD-10-CM | POA: Diagnosis not present

## 2020-10-22 NOTE — Progress Notes (Signed)
Spoke w/ via phone for pre-op interview---pt Lab needs dos---- renal function panel done 10-16-2020 [place] [facility] on chart           COVID test ------10-25-2020 at 1310 Arrive at -------700 am 10-29-2020 NPO after MN NO Solid Food.  Clear liquids from MN until---600 am then npo Medications to take morning of surgery -----levothyroxine Diabetic medication ----n/a- Patient Special Instructions -----bring cpap mask tubing and machine and leave in car Pre-Op special Istructions -----none Patient verbalized understanding of instructions that were given at this phone interview. Patient denies shortness of breath, chest pain, fever, cough at this phone interview.  Anesthesia Review: no ( had cardiac clearance dr Harrell Gave bridgett 7--12-2019 for 06-11-2020 surgery), spoke with jessica zanetto for 06-21-2020 surgery and pt met [facility] guidelines, see Lyndell Gillyard shofner rn progress note 05-30-27-2021`  PCP:dr ramachandra lov lonnie kendall np lov 10-22-2020 (pt to bring copy of lov day of surgery) Cardiologist : dr Shawna Orleans christopher lov 08-29-2020 epic, hospital follow up Chest x-ray : 08-20-2020 epic EKG : 08-20-2020 epic Stress test 08-22-2020 epic Echo : 05-03-2020 epic Ett: 05-24-2020 epic lov nephrology dr dran Carolin Sicks on chart Cardiac Cath : none Activity level: walks 2 miles several times a week without difficulty, can climb flight of steps without difficulty or sob Sleep Study/ CPAP : uses cpap nightly, does not know cpap settings Fasting Blood Sugar :      / Checks Blood Sugar -- times a day:  n/a Blood Thinner/ Instructions /Last Dose:n/a ASA / Instructions/ Last Dose : pt instructed from dr pace to stop 81 mg aspirin after 10-23-2020 dose for 10-29-2020 surgery   Pt has ring stuck on left 4th finger cannot remove ring

## 2020-10-23 ENCOUNTER — Other Ambulatory Visit: Payer: Self-pay | Admitting: Urology

## 2020-10-23 DIAGNOSIS — N403 Nodular prostate with lower urinary tract symptoms: Secondary | ICD-10-CM

## 2020-10-24 ENCOUNTER — Other Ambulatory Visit: Payer: Self-pay | Admitting: Urology

## 2020-10-25 ENCOUNTER — Other Ambulatory Visit (HOSPITAL_COMMUNITY)
Admission: RE | Admit: 2020-10-25 | Discharge: 2020-10-25 | Disposition: A | Discharge status: Home / Self Care | Payer: Medicare Other | Source: Ambulatory Visit | Attending: Urology | Admitting: Urology

## 2020-10-25 DIAGNOSIS — Z20822 Contact with and (suspected) exposure to covid-19: Secondary | ICD-10-CM | POA: Insufficient documentation

## 2020-10-25 DIAGNOSIS — Z01812 Encounter for preprocedural laboratory examination: Secondary | ICD-10-CM | POA: Insufficient documentation

## 2020-10-25 LAB — SARS CORONAVIRUS 2 (TAT 6-24 HRS): SARS Coronavirus 2: NEGATIVE

## 2020-10-28 NOTE — H&P (Signed)
CC/HPI: cc: Bladder cancer   05/16/20: 86 year old man with a history of HG Ta bladder cancer diagnosed September 2018 with recurrence January 2021 comes in for surveillance cystoscopy. Patient also completed second induction BCG prior to today. Most recent pathology showed pTis from TURP. He has a history of BPH with multiple TURP for regrowth. Patient experience gross hematuria earlier this month which has resolved. He does report having history of clot urinary retention x2. No dysuria or symptoms of UTI.   07/11/20: 86 year old man with a history of high-grade Ta bladder cancer diagnosed at timer 2018 with recurrence in January 2021 who had a 2nd recurrence and underwent TURBT 06/25/20. Pathology shows early low-grade Ta bladder cancer. Patient has already undergone 2 rounds of induction BCG. Postoperatively patient has experienced dysuria and low volume voids with urinary frequency. Urinalysis was obtained at a prior visit and was negative for bacterial growth. Urinalysis today does show red blood cells and white blood cells but rare bacteria. His symptoms are slowly improving. Patient is also being seen by nephrology for CKD. Prior operative reports do show resection of ureteral orifices however these were noted to be open in the operating room.   10/14/2020: 86 year old man with a history of high-grade Ta bladder cancer diagnosed at timer 2018 with recurrence in January 2021 who had a 2nd recurrence and underwent TURBT 06/25/20. Pathology shows early low-grade Ta bladder cancer. Since then patient has had 1 BCG bladder installation and then it was placed on hold due to the nationwide shortage. He has also struggled with recurrent UTIs since that date. Patient has an appointment with nephrology at [place] coming up soon.     ALLERGIES: Cipro TABS - Diarrhea, **Diarrhea if taking 750mg , 500mg  ok to take** Macrobid - Respiratory Distress   MEDICATIONS: Levothyroxine Sodium 50 mcg tablet   Phenazopyridine Hcl 200 mg tablet  Aspirin Ec 81 mg tablet, delayed release  Irbesartan 300 mg tablet  Pantoprazole Sodium 40 mg tablet, delayed release  Rosuvastatin Calcium 5 mg tablet  Silodosin 8 mg capsule 1 capsule PO Q HS    GU PSH: Bladder Instill AntiCA Agent - 09/05/2020, 08/15/2020, 03/06/2020, 02/28/2020, 02/21/2020, 02/14/2020, 02/07/2020, 01/31/2020, 2019, 2018, 2018, 2018, 2018, 2018 Cysto Bladder Ureth Biopsy - 06/25/2020 Cystoscopy - 05/16/2020, 11/09/2019, 08/11/2019, 05/05/2019, 2020, 11/01/2018, 2019, 2019, 2019, 2018 Cystoscopy Insert Stent, Right - 2018 Cystoscopy TURBT >5 cm - 12/08/2019, 2018 Cystoscopy TURBT 2-5 cm - 2018 Cystoscopy TURP Locm 300-399Mg /Ml Iodine,1Ml - 2018 Remove Prostate Regrowth - 12/08/2019    NON-GU PSH: Cataract surgery - 2019 Knee Arthroscopy, Left         GU PMH: Bladder Cancer overlapping sites - 09/11/2020, - 09/05/2020, - 08/15/2020, - 08/08/2020, - 08/11/2019, - 05/05/2019, - 2020, - 11/01/2018, - 2019, - 2019, - 2019, - 2018, - 2018 Acute Cystitis/UTI - 07/05/2020 Gross hematuria, Improved - 01/26/2020, - 01/19/2020, - 12/15/2019, - 11/09/2019 (Improving, Chronic), Reassured at this time if urine is only light pink/clear cherry he does not need admitting for CBI. He will continue with ABX as Rx'd and proceed with cysto on 09/20/17 as scheduled. Instructed pt and family that if he develops gross hematuria with clots will need to go immediately to [facility] for evaluation. Culture urine. Will continue with current ABX. , - 2018, - 2018, - 2018, - 2018 Urinary Retention - 01/19/2020 BPH w/LUTS - 08/11/2019, - 05/05/2019, - 2020, - 11/01/2018, - 2019, - 2019, - 2019, - 2018, Benign prostatic hyperplasia with urinary obstruction, - 2014  ED due to arterial insufficiency, Erectile dysfunction due to arterial insufficiency - 2014 Urinary Retention, Unspec, Incomplete bladder emptying - 2014 History of bladder cancer      PMH Notes: .   NON-GU PMH:  Arthritis GERD Hypercholesterolemia Hypertension Hypothyroidism     FAMILY HISTORY: Alzheimer's Disease - Mother Brain tumor - Mother Breast Cancer - Mother Cardiac Devices Pacemaker Present - Sister copd - Father Death of family member - Father, Mother Diabetes - Brother Hypertension - Runs In Family Kidney Stones - Son, Runs in Family     Notes: 2 sons; 1 daughter    SOCIAL HISTORY: Marital Status: Widowed Preferred Language: English; Ethnicity: Not Hispanic Or Latino; Race: White Current Smoking Status: Patient has never smoked.  <DIV'  Tobacco Use Assessment Completed:  Used Tobacco in last 30 days?   Drinks 4 drinks per month.  Drinks 4+ caffeinated drinks per day. Patient's occupation is/was retired.     REVIEW OF SYSTEMS:      GU Review Male:  Patient denies frequent urination, hard to postpone urination, burning/ pain with urination, get up at night to urinate, leakage of urine, stream starts and stops, trouble starting your stream, have to strain to urinate , erection problems, and penile pain.     Gastrointestinal (Upper):  Patient denies nausea, vomiting, and indigestion/ heartburn.     Gastrointestinal (Lower):  Patient denies diarrhea and constipation.     Constitutional:  Patient denies fever, night sweats, weight loss, and fatigue.     Skin:  Patient denies skin rash/ lesion and itching.     Eyes:  Patient denies blurred vision and double vision.     Ears/ Nose/ Throat:  Patient denies sore throat and sinus problems.     Hematologic/Lymphatic:  Patient denies swollen glands and easy bruising.     Cardiovascular:  Patient denies leg swelling and chest pains.     Respiratory:  Patient denies cough and shortness of breath.     Endocrine:  Patient denies excessive thirst.     Musculoskeletal:  Patient denies back pain and joint pain.     Neurological:  Patient denies headaches and dizziness.     Psychologic:  Patient denies depression and anxiety.     VITAL SIGNS:  None      MULTI-SYSTEM PHYSICAL EXAMINATION:       Constitutional: Well-nourished. No physical deformities. Normally developed. Good grooming.      Neck: Neck symmetrical, not swollen. Normal tracheal position.      Respiratory: No labored breathing, no use of accessory muscles.       Cardiovascular: Normal temperature      Skin: No paleness, no jaundice, no cyanosis. No lesion, no ulcer, no rash.      Neurologic / Psychiatric: Oriented to time, oriented to place, oriented to person. No depression, no anxiety, no agitation.      Gastrointestinal: No rigidity, non obese abdomen.       Eyes: Normal conjunctivae. Normal eyelids.      Ears, Nose, Mouth, and Throat: Left ear no scars, no lesions, no masses. Right ear no scars, no lesions, no masses. Nose no scars, no lesions, no masses. Normal hearing. Normal lips.      Musculoskeletal: Normal gait and station of head and neck.              Complexity of Data:       08/18/08 02/20/08 02/17/07 10/14/05 06/23/04 07/23/03  PSA  Total PSA 2.00  2.49  2.38  2.24  2.29  2.47   Free PSA   0.79      % Free PSA   33.2        PROCEDURES:    Flexible Cystoscopy - 52000  Risks, benefits, and some of the potential complications of the procedure were discussed at length with the patient including infection, bleeding, voiding discomfort, urinary retention, fever, chills, sepsis, and others. All questions were answered. Informed consent was obtained. Sterile technique and intraurethral analgesia were used.  Meatus:  Normal size. Normal location. Normal condition.  Urethra:  No strictures.  External Sphincter:  Normal.  Verumontanum:  Normal.  Prostate:  Non-obstructing - evidence of prior TUR, bladder neck wide open, no regrowth.   Bladder Neck:  Non-obstructing.  Ureteral Orifices:  Normal location. Normal size. Normal shape. Effluxed clear urine.  Bladder:  Mild trabeculation. Multiple well healed prior TUR scars with resected right UO. Posterior  there is early regrowth consistent with low grade bladder cancer that is oozing. No papillary tumors seen.       The lower urinary tract was carefully examined. The procedure was well-tolerated and without complications. Antibiotic instructions were given. Instructions were given to call the office immediately for bloody urine, difficulty urinating, urinary retention, painful or frequent urination, fever, chills, nausea, vomiting or other illness. The patient stated that he understood these instructions and would comply with them.    PVR Ultrasound - 05397  Scanned Volume: 097 cc    Urinalysis w/Scope  Dipstick Dipstick Cont'd Micro  Color: Yellow Bilirubin: Neg mg/dL WBC/hpf: 40 - 60/hpf  Appearance: Cloudy Ketones: Neg mg/dL RBC/hpf: 40 - 60/hpf  Specific Gravity: 1.015 Blood: 3+ ery/uL Bacteria: Few (10-25/hpf)  pH: 5.5 Protein: 1+ mg/dL Cystals: NS (Not Seen)  Glucose: Neg mg/dL Urobilinogen: 0.2 mg/dL Casts: NS (Not Seen)   Nitrites: Neg Trichomonas: Not Present   Leukocyte Esterase: 3+ leu/uL Mucous: Not Present    Epithelial Cells: NS (Not Seen)    Yeast: NS (Not Seen)    Sperm: Not Present    ASSESSMENT:     ICD-10 Details  1 GU:  Bladder Cancer Posterior - C67.4 Chronic, Exacerbation - Patient with possible bladder tumor recurrence on cystoscopy today. He has active oozing from posterior bladder wall in between 2 prior TUR sites with irregular bladder mucosa. Given his recent recurrence we discussed proceeding with cystoscopy, bladder biopsy and fulguration of abnormal tissue. Will also discussed with patient intravesical gemcitabine. Risks and benefits of the procedure discussed with the patient including but not limited to pain, bleeding, possible need for Foley catheter, injury to surrounding structures including bladder ureters and prostate, need for future treatment, infection.  2  BPH w/LUTS - N40.1 Chronic, Stable - Patient remains on Rapaflo and finasteride however still  struggling with urinary frequency. Cystoscopy shows wide open prosthetic urethra. PVR is less than 100 mL today in the office. We discussed a trial of Myrbetriq 25 mg daily. I did discuss that he needs to take his blood pressure while starting medication. He will let me know if he has any adverse side effects.  3  Urinary Frequency - R35.0 Chronic, Worsening   PLAN:   Orders    Schedule  X-Rays: MRI Prostate GSORAD With and Without I.V. Contrast - January 2022  Return Visit/Planned Activity: Other See Visit Notes - Office Visit  Return Notes: after MRI has been completed  Document  Letter(s):  Created for Patient: Clinical Summary   Notes:  cc: Lucianne Muss, MD  cc: [facility]

## 2020-10-29 ENCOUNTER — Ambulatory Visit (HOSPITAL_BASED_OUTPATIENT_CLINIC_OR_DEPARTMENT_OTHER): Payer: Medicare Other | Admitting: Anesthesiology

## 2020-10-29 ENCOUNTER — Encounter (HOSPITAL_BASED_OUTPATIENT_CLINIC_OR_DEPARTMENT_OTHER)
Admission: RE | Disposition: A | Discharge status: Home / Self Care | Payer: Self-pay | Source: Home / Self Care | Attending: Urology

## 2020-10-29 ENCOUNTER — Encounter (HOSPITAL_BASED_OUTPATIENT_CLINIC_OR_DEPARTMENT_OTHER): Payer: Self-pay | Admitting: Urology

## 2020-10-29 ENCOUNTER — Ambulatory Visit (HOSPITAL_BASED_OUTPATIENT_CLINIC_OR_DEPARTMENT_OTHER)
Admission: RE | Admit: 2020-10-29 | Discharge: 2020-10-29 | Disposition: A | Discharge status: Home / Self Care | Payer: Medicare Other | Attending: Urology | Admitting: Urology

## 2020-10-29 DIAGNOSIS — N401 Enlarged prostate with lower urinary tract symptoms: Secondary | ICD-10-CM | POA: Insufficient documentation

## 2020-10-29 DIAGNOSIS — R350 Frequency of micturition: Secondary | ICD-10-CM | POA: Insufficient documentation

## 2020-10-29 DIAGNOSIS — C679 Malignant neoplasm of bladder, unspecified: Secondary | ICD-10-CM | POA: Insufficient documentation

## 2020-10-29 DIAGNOSIS — N183 Chronic kidney disease, stage 3 unspecified: Secondary | ICD-10-CM | POA: Diagnosis not present

## 2020-10-29 DIAGNOSIS — N3289 Other specified disorders of bladder: Secondary | ICD-10-CM | POA: Diagnosis not present

## 2020-10-29 DIAGNOSIS — I129 Hypertensive chronic kidney disease with stage 1 through stage 4 chronic kidney disease, or unspecified chronic kidney disease: Secondary | ICD-10-CM | POA: Diagnosis not present

## 2020-10-29 DIAGNOSIS — Z8551 Personal history of malignant neoplasm of bladder: Secondary | ICD-10-CM | POA: Diagnosis not present

## 2020-10-29 HISTORY — PX: TRANSURETHRAL RESECTION OF BLADDER TUMOR: SHX2575

## 2020-10-29 HISTORY — DX: Chronic kidney disease, stage 3 unspecified: N18.30

## 2020-10-29 HISTORY — PX: CYSTOSCOPY: SHX5120

## 2020-10-29 SURGERY — TURBT (TRANSURETHRAL RESECTION OF BLADDER TUMOR)
Anesthesia: General | Site: Bladder

## 2020-10-29 MED ORDER — LIDOCAINE 2% (20 MG/ML) 5 ML SYRINGE
INTRAMUSCULAR | Status: DC | PRN
  Administered 2020-10-29: 60 mg via INTRAVENOUS

## 2020-10-29 MED ORDER — SODIUM CHLORIDE 0.9 % IR SOLN
Status: DC | PRN
  Administered 2020-10-29: 3000 mL

## 2020-10-29 MED ORDER — FENTANYL CITRATE (PF) 100 MCG/2ML IJ SOLN: 25.0000 ug | INTRAMUSCULAR | Status: DC | PRN

## 2020-10-29 MED ORDER — DEXAMETHASONE SODIUM PHOSPHATE 10 MG/ML IJ SOLN
INTRAMUSCULAR | Status: DC | PRN
  Administered 2020-10-29: 10 mg via INTRAVENOUS

## 2020-10-29 MED ORDER — PROPOFOL 10 MG/ML IV BOLUS
INTRAVENOUS | Status: AC
  Filled 2020-10-29: qty 20

## 2020-10-29 MED ORDER — FENTANYL CITRATE (PF) 100 MCG/2ML IJ SOLN
INTRAMUSCULAR | Status: AC
  Filled 2020-10-29: qty 2

## 2020-10-29 MED ORDER — ONDANSETRON HCL 4 MG/2ML IJ SOLN
INTRAMUSCULAR | Status: AC
  Filled 2020-10-29: qty 2

## 2020-10-29 MED ORDER — SODIUM CHLORIDE 0.9 % IV SOLN
2.0000 g | INTRAVENOUS | Status: AC
  Administered 2020-10-29: 2 g via INTRAVENOUS

## 2020-10-29 MED ORDER — LIDOCAINE HCL (PF) 2 % IJ SOLN
INTRAMUSCULAR | Status: AC
  Filled 2020-10-29: qty 5

## 2020-10-29 MED ORDER — PROPOFOL 10 MG/ML IV BOLUS
INTRAVENOUS | Status: DC | PRN
  Administered 2020-10-29: 150 mg via INTRAVENOUS
  Administered 2020-10-29: 50 mg via INTRAVENOUS

## 2020-10-29 MED ORDER — SODIUM CHLORIDE 0.9 % IV SOLN
INTRAVENOUS | Status: AC
  Filled 2020-10-29: qty 100

## 2020-10-29 MED ORDER — ONDANSETRON HCL 4 MG/2ML IJ SOLN
INTRAMUSCULAR | Status: DC | PRN
  Administered 2020-10-29: 4 mg via INTRAVENOUS

## 2020-10-29 MED ORDER — DEXAMETHASONE SODIUM PHOSPHATE 10 MG/ML IJ SOLN
INTRAMUSCULAR | Status: AC
  Filled 2020-10-29: qty 1

## 2020-10-29 MED ORDER — ACETAMINOPHEN 500 MG PO TABS
1000.0000 mg | ORAL_TABLET | Freq: Once | ORAL | Status: AC
  Administered 2020-10-29: 1000 mg via ORAL

## 2020-10-29 MED ORDER — FENTANYL CITRATE (PF) 100 MCG/2ML IJ SOLN
INTRAMUSCULAR | Status: DC | PRN
  Administered 2020-10-29 (×2): 50 ug via INTRAVENOUS

## 2020-10-29 MED ORDER — AMPICILLIN 500 MG PO CAPS: 500.0000 mg | ORAL_CAPSULE | Freq: Four times a day (QID) | ORAL | 0 refills | Status: DC

## 2020-10-29 MED ORDER — ACETAMINOPHEN 500 MG PO TABS
ORAL_TABLET | ORAL | Status: AC
  Filled 2020-10-29: qty 2

## 2020-10-29 MED ORDER — CEFTRIAXONE SODIUM 2 G IJ SOLR
INTRAMUSCULAR | Status: AC
  Filled 2020-10-29: qty 20

## 2020-10-29 MED ORDER — SODIUM CHLORIDE 0.9 % IV SOLN: INTRAVENOUS | Status: DC

## 2020-10-29 MED ORDER — AMISULPRIDE (ANTIEMETIC) 5 MG/2ML IV SOLN: 10.0000 mg | Freq: Once | INTRAVENOUS | Status: DC | PRN

## 2020-10-29 SURGICAL SUPPLY — 18 items
BAG DRAIN URO-CYSTO SKYTR STRL (DRAIN) ×3 IMPLANT
BAG DRN RND TRDRP ANRFLXCHMBR (UROLOGICAL SUPPLIES)
BAG DRN UROCATH (DRAIN) ×1
BAG URINE DRAIN 2000ML AR STRL (UROLOGICAL SUPPLIES) IMPLANT
CLOTH BEACON ORANGE TIMEOUT ST (SAFETY) ×3 IMPLANT
ELECT REM PT RETURN 9FT ADLT (ELECTROSURGICAL)
ELECTRODE REM PT RTRN 9FT ADLT (ELECTROSURGICAL) ×1 IMPLANT
GLOVE BIO SURGEON STRL SZ 6.5 (GLOVE) ×2 IMPLANT
GLOVE BIO SURGEONS STRL SZ 6.5 (GLOVE) ×1
GOWN STRL REUS W/TWL LRG LVL3 (GOWN DISPOSABLE) ×3 IMPLANT
IV NS IRRIG 3000ML ARTHROMATIC (IV SOLUTION) ×4 IMPLANT
KIT TURNOVER CYSTO (KITS) ×3 IMPLANT
LOOP CUT BIPOLAR 24F LRG (ELECTROSURGICAL) ×2 IMPLANT
MANIFOLD NEPTUNE II (INSTRUMENTS) ×3 IMPLANT
PACK CYSTO (CUSTOM PROCEDURE TRAY) ×3 IMPLANT
TUBE CONNECTING 12'X1/4 (SUCTIONS) ×1
TUBE CONNECTING 12X1/4 (SUCTIONS) ×2 IMPLANT
TUBING UROLOGY SET (TUBING) ×3 IMPLANT

## 2020-10-29 NOTE — Anesthesia Preprocedure Evaluation (Signed)
Anesthesia Evaluation  Patient identified by MRN, date of birth, ID band Patient awake    Reviewed: Allergy & Precautions, NPO status , Patient's Chart, lab work & pertinent test results  Airway Mallampati: II  TM Distance: >3 FB Neck ROM: Full    Dental  (+) Dental Advisory Given   Pulmonary sleep apnea ,    breath sounds clear to auscultation       Cardiovascular hypertension, Pt. on medications + dysrhythmias  Rhythm:Regular Rate:Normal     Neuro/Psych negative neurological ROS     GI/Hepatic Neg liver ROS, GERD  ,  Endo/Other  Hypothyroidism   Renal/GU Renal disease     Musculoskeletal  (+) Arthritis ,   Abdominal   Peds  Hematology negative hematology ROS (+)   Anesthesia Other Findings   Reproductive/Obstetrics                             Anesthesia Physical Anesthesia Plan  ASA: III  Anesthesia Plan: General   Post-op Pain Management:    Induction: Intravenous  PONV Risk Score and Plan: 2 and Dexamethasone, Ondansetron and Treatment may vary due to age or medical condition  Airway Management Planned: LMA  Additional Equipment:   Intra-op Plan:   Post-operative Plan: Extubation in OR  Informed Consent: I have reviewed the patients History and Physical, chart, labs and discussed the procedure including the risks, benefits and alternatives for the proposed anesthesia with the patient or authorized representative who has indicated his/her understanding and acceptance.     Dental advisory given  Plan Discussed with: CRNA  Anesthesia Plan Comments:         Anesthesia Quick Evaluation

## 2020-10-29 NOTE — Anesthesia Postprocedure Evaluation (Signed)
Anesthesia Post Note  Patient: [name] [name]  Procedure(s) Performed: BLADDER FULGERATION (N/A Bladder) CYSTOSCOPY (N/A Bladder)     Patient location during evaluation: PACU Anesthesia Type: General Level of consciousness: awake and alert Pain management: pain level controlled Vital Signs Assessment: post-procedure vital signs reviewed and stable Respiratory status: spontaneous breathing, nonlabored ventilation, respiratory function stable and patient connected to nasal cannula oxygen Cardiovascular status: blood pressure returned to baseline and stable Postop Assessment: no apparent nausea or vomiting Anesthetic complications: no   No complications documented.  Last Vitals:  Vitals:   10/29/20 0945 10/29/20 1115  BP: 122/73 126/70  Pulse: 67 70  Resp: 19 17  Temp:  36.7 C  SpO2: 96%     Last Pain:  Vitals:   10/29/20 1115  TempSrc:   PainSc: 3                  Tiajuana Amass

## 2020-10-29 NOTE — Transfer of Care (Signed)
Immediate Anesthesia Transfer of Care Note  Patient: [name] [name]  Procedure(s) Performed: BLADDER FULGERATION (N/A Bladder) CYSTOSCOPY (N/A Bladder)  Patient Location: PACU  Anesthesia Type:General  Level of Consciousness: awake, alert , oriented and patient cooperative  Airway & Oxygen Therapy: Patient Spontanous Breathing  Post-op Assessment: Report given to RN and Post -op Vital signs reviewed and stable  Post vital signs: Reviewed and stable  Last Vitals:  Vitals Value Taken Time  BP 128/77 10/29/20 0913  Temp    Pulse 76 10/29/20 0914  Resp 12 10/29/20 0914  SpO2 93 % 10/29/20 0914  Vitals shown include unvalidated device data.  Last Pain:  Vitals:   10/29/20 0749  TempSrc: Oral  PainSc: 0-No pain      Patients Stated Pain Goal: 5 (73/56/70 1410)  Complications: No complications documented.

## 2020-10-29 NOTE — Anesthesia Procedure Notes (Signed)
Procedure Name: LMA Insertion Date/Time: 10/29/2020 8:43 AM Performed by: Rogers Blocker, CRNA Pre-anesthesia Checklist: Patient identified, Emergency Drugs available, Suction available and Patient being monitored Patient Re-evaluated:Patient Re-evaluated prior to induction Oxygen Delivery Method: Circle System Utilized Preoxygenation: Pre-oxygenation with 100% oxygen Induction Type: IV induction Ventilation: Mask ventilation without difficulty LMA: LMA inserted LMA Size: 5.0 Number of attempts: 1 Placement Confirmation: positive ETCO2 Tube secured with: Tape Dental Injury: Teeth and Oropharynx as per pre-operative assessment

## 2020-10-29 NOTE — Op Note (Signed)
PATIENT:  [name] [name]  PRE-OPERATIVE DIAGNOSIS: abnormal bladder mucosa  POST-OPERATIVE DIAGNOSIS: Same  PROCEDURE:  Procedure(s): 1. Cystoscopy with fulguration 3 cm  SURGEON:  Jacalyn Lefevre, MD  ANESTHESIA:   General  EBL:  Minimal  DRAINS: none  SPECIMEN:  none  DISPOSITION OF SPECIMEN:  None  FINDINGS: 1.  Normal anterior urethra 2.  Normal posterior urethra with evidence of prior TUR 3.  Bilateral ureteral orifices seen and had previously been resected with left UO displaced more laterally 4.  Multiple well-healed prior TUR scars and bladder 5.  Posterior wall with approximately 3 cm area of erythematous slightly oozing mucosa concerning for early bladder cancer recurrence  Indication:  86 year old man with a history of high-grade Ta bladder cancer diagnosed at timer 2018 with recurrence in January 2021 who had a 2nd recurrence and underwent TURBT 06/25/20. Pathology shows early low-grade Ta bladder cancer.  Recent surveillance cystoscopy with friable, oozing mucosa concerning for early bladder cancer recurrence.   Description of operation: The patient was taken to the operating room and administered general anesthesia. They were then placed on the table and moved to the dorsal lithotomy position after which the genitalia was sterilely prepped and draped. An official timeout was then performed.  The 8 French resectoscope with the 30 lens and visual obturator were then passed into the bladder under direct visualization. Urethra appeared normal. The visual obturator was then removed and the Gyrus resectoscope element with 30  lens was then inserted and the bladder was fully and systematically inspected. Ureteral orifices were visualized and had been previously resected.    I used the bipolar electrocautery loop to fulgurate the posterior bladder wall.  This is approximately 3 cm in diameter.  Reinspection of the bladder revealed all obvious abnormal bladder mucosa had  been fulgurated.  The bladder was decompressed.  I then removed the resectoscope.  Patient emerged from anesthesia without complication.  PLAN OF CARE: Discharge to home after PACU  PATIENT DISPOSITION:  PACU - hemodynamically stable.

## 2020-10-29 NOTE — Discharge Instructions (Signed)
Post Bladder Surgery Instructions   General instructions:     Your recent bladder surgery requires very little post hospital care but some definite precautions.  Despite the fact that no skin incisions were used, the area around the bladder incisions are raw and covered with scabs to promote healing and prevent bleeding. Certain precautions are needed to insure that the scabs are not disturbed over the next 2-4 weeks while the healing proceeds.  Because the raw surface inside your bladder and the irritating effects of urine you may expect frequency of urination and/or urgency (a stronger desire to urinate) and perhaps even getting up at night more often. This will usually resolve or improve slowly over the healing period. You may see some blood in your urine over the first 6 weeks. Do not be alarmed, even if the urine was clear for a while. Get off your feet and drink lots of fluids until clearing occurs. If you start to pass clots or don't improve call us.  Catheter: (If you are discharged with a catheter.)  1. Keep your catheter secured to your leg at all times with tape or the supplied strap. 2. You may experience leakage of urine around your catheter- as long as the  catheter continues to drain, this is normal.  If your catheter stops draining  go to the ER. 3. You may also have blood in your urine, even after it has been clear for  several days; you may even pass some small blood clots or other material.  This  is normal as well.  If this happens, sit down and drink plenty of water to help  make urine to flush out your bladder.  If the blood in your urine becomes worse  after doing this, contact our office or return to the ER. 4. You may use the leg bag (small bag) during the day, but use the large bag at  night.  Diet:  You may return to your normal diet immediately. Because of the raw surface of your bladder, alcohol, spicy foods, foods high in acid and drinks with caffeine may  cause irritation or frequency and should be used in moderation. To keep your urine flowing freely and avoid constipation, drink plenty of fluids during the day (8-10 glasses). Tip: Avoid cranberry juice because it is very acidic.  Activity:  Your physical activity doesn't need to be restricted. However, if you are very active, you may see some blood in the urine. We suggest that you reduce your activity under the circumstances until the bleeding has stopped.  Bowels:  It is important to keep your bowels regular during the postoperative period. Straining with bowel movements can cause bleeding. A bowel movement every other day is reasonable. Use a mild laxative if needed, such as milk of magnesia 2-3 tablespoons, or 2 Dulcolax tablets. Call if you continue to have problems. If you had been taking narcotics for pain, before, during or after your surgery, you may be constipated. Take a laxative if necessary.    Medication:  You should resume your pre-surgery medications unless told not to. In addition you may be given an antibiotic to prevent or treat infection. Antibiotics are not always necessary. All medication should be taken as prescribed until the bottles are finished unless you are having an unusual reaction to one of the drugs.   Post Anesthesia Home Care Instructions  Activity: Get plenty of rest for the remainder of the day. A responsible adult should stay with you for  24 hours following the procedure.  For the next 24 hours, DO NOT: -Drive a car -Paediatric nurse -Drink alcoholic beverages -Take any medication unless instructed by your physician -Make any legal decisions or sign important papers.  Meals: Start with liquid foods such as gelatin or soup. Progress to regular foods as tolerated. Avoid greasy, spicy, heavy foods. If nausea and/or vomiting occur, drink only clear liquids until the nausea and/or vomiting subsides. Call your physician if vomiting continues.  Special  Instructions/Symptoms: Your throat may feel dry or sore from the anesthesia or the breathing tube placed in your throat during surgery. If this causes discomfort, gargle with warm salt water. The discomfort should disappear within 24 hours.  If you had a scopolamine patch placed behind your ear for the management of post- operative nausea and/or vomiting:  1. The medication in the patch is effective for 72 hours, after which it should be removed.  Wrap patch in a tissue and discard in the trash. Wash hands thoroughly with soap and water. 2. You may remove the patch earlier than 72 hours if you experience unpleasant side effects which may include dry mouth, dizziness or visual disturbances. 3. Avoid touching the patch. Wash your hands with soap and water after contact with the patch.

## 2020-10-29 NOTE — Interval H&P Note (Signed)
History and Physical Interval Note:  10/29/2020 7:43 AM  [name] [name]  has presented today for surgery, with the diagnosis of BLADDER CANCER.  The various methods of treatment have been discussed with the patient and family. After consideration of risks, benefits and other options for treatment, the patient has consented to  Procedure(s) with comments: TRANSURETHRAL RESECTION OF BLADDER TUMOR (TURBT) (N/A) - 30 MINS CYSTOSCOPY (N/A) as a surgical intervention.  The patient's history has been reviewed, patient examined, no change in status, stable for surgery.  I have reviewed the patient's chart and labs.  Questions were answered to the patient's satisfaction.     Daziyah Cogan D Belen Zwahlen

## 2020-10-30 ENCOUNTER — Encounter (HOSPITAL_BASED_OUTPATIENT_CLINIC_OR_DEPARTMENT_OTHER): Payer: Self-pay | Admitting: Urology

## 2020-11-11 DIAGNOSIS — R8271 Bacteriuria: Secondary | ICD-10-CM | POA: Diagnosis not present

## 2020-11-11 DIAGNOSIS — N3000 Acute cystitis without hematuria: Secondary | ICD-10-CM | POA: Diagnosis not present

## 2020-11-11 DIAGNOSIS — R3915 Urgency of urination: Secondary | ICD-10-CM | POA: Diagnosis not present

## 2020-11-11 DIAGNOSIS — C678 Malignant neoplasm of overlapping sites of bladder: Secondary | ICD-10-CM | POA: Diagnosis not present

## 2020-11-13 DIAGNOSIS — N1832 Chronic kidney disease, stage 3b: Secondary | ICD-10-CM | POA: Diagnosis not present

## 2020-11-13 DIAGNOSIS — I129 Hypertensive chronic kidney disease with stage 1 through stage 4 chronic kidney disease, or unspecified chronic kidney disease: Secondary | ICD-10-CM | POA: Diagnosis not present

## 2020-11-14 DIAGNOSIS — G4737 Central sleep apnea in conditions classified elsewhere: Secondary | ICD-10-CM | POA: Diagnosis not present

## 2020-11-14 DIAGNOSIS — G4733 Obstructive sleep apnea (adult) (pediatric): Secondary | ICD-10-CM | POA: Diagnosis not present

## 2020-11-27 ENCOUNTER — Other Ambulatory Visit: Payer: Medicare Other

## 2020-12-12 DIAGNOSIS — N1832 Chronic kidney disease, stage 3b: Secondary | ICD-10-CM | POA: Diagnosis not present

## 2020-12-12 DIAGNOSIS — R7309 Other abnormal glucose: Secondary | ICD-10-CM | POA: Diagnosis not present

## 2020-12-12 DIAGNOSIS — I10 Essential (primary) hypertension: Secondary | ICD-10-CM | POA: Diagnosis not present

## 2020-12-12 DIAGNOSIS — E039 Hypothyroidism, unspecified: Secondary | ICD-10-CM | POA: Diagnosis not present

## 2020-12-12 DIAGNOSIS — E782 Mixed hyperlipidemia: Secondary | ICD-10-CM | POA: Diagnosis not present

## 2020-12-12 DIAGNOSIS — I129 Hypertensive chronic kidney disease with stage 1 through stage 4 chronic kidney disease, or unspecified chronic kidney disease: Secondary | ICD-10-CM | POA: Diagnosis not present

## 2020-12-17 ENCOUNTER — Other Ambulatory Visit: Payer: Medicare Other

## 2020-12-17 DIAGNOSIS — Z20822 Contact with and (suspected) exposure to covid-19: Secondary | ICD-10-CM | POA: Diagnosis not present

## 2020-12-19 LAB — NOVEL CORONAVIRUS, NAA: SARS-CoV-2, NAA: NOT DETECTED

## 2020-12-19 LAB — SARS-COV-2, NAA 2 DAY TAT

## 2020-12-25 DIAGNOSIS — N1832 Chronic kidney disease, stage 3b: Secondary | ICD-10-CM | POA: Diagnosis not present

## 2020-12-25 DIAGNOSIS — E782 Mixed hyperlipidemia: Secondary | ICD-10-CM | POA: Diagnosis not present

## 2020-12-25 DIAGNOSIS — I10 Essential (primary) hypertension: Secondary | ICD-10-CM | POA: Diagnosis not present

## 2020-12-27 DIAGNOSIS — R8271 Bacteriuria: Secondary | ICD-10-CM | POA: Diagnosis not present

## 2020-12-27 DIAGNOSIS — C678 Malignant neoplasm of overlapping sites of bladder: Secondary | ICD-10-CM | POA: Diagnosis not present

## 2020-12-27 DIAGNOSIS — N3000 Acute cystitis without hematuria: Secondary | ICD-10-CM | POA: Diagnosis not present

## 2021-01-01 DIAGNOSIS — N390 Urinary tract infection, site not specified: Secondary | ICD-10-CM | POA: Diagnosis not present

## 2021-01-14 DIAGNOSIS — R319 Hematuria, unspecified: Secondary | ICD-10-CM | POA: Diagnosis not present

## 2021-01-14 DIAGNOSIS — C678 Malignant neoplasm of overlapping sites of bladder: Secondary | ICD-10-CM | POA: Diagnosis not present

## 2021-01-20 DIAGNOSIS — I129 Hypertensive chronic kidney disease with stage 1 through stage 4 chronic kidney disease, or unspecified chronic kidney disease: Secondary | ICD-10-CM | POA: Diagnosis not present

## 2021-01-20 DIAGNOSIS — E782 Mixed hyperlipidemia: Secondary | ICD-10-CM | POA: Diagnosis not present

## 2021-01-20 DIAGNOSIS — E039 Hypothyroidism, unspecified: Secondary | ICD-10-CM | POA: Diagnosis not present

## 2021-01-20 DIAGNOSIS — N1832 Chronic kidney disease, stage 3b: Secondary | ICD-10-CM | POA: Diagnosis not present

## 2021-01-21 DIAGNOSIS — C678 Malignant neoplasm of overlapping sites of bladder: Secondary | ICD-10-CM | POA: Diagnosis not present

## 2021-01-21 DIAGNOSIS — Z5111 Encounter for antineoplastic chemotherapy: Secondary | ICD-10-CM | POA: Diagnosis not present

## 2021-01-21 DIAGNOSIS — R319 Hematuria, unspecified: Secondary | ICD-10-CM | POA: Diagnosis not present

## 2021-01-28 DIAGNOSIS — C678 Malignant neoplasm of overlapping sites of bladder: Secondary | ICD-10-CM | POA: Diagnosis not present

## 2021-01-28 DIAGNOSIS — Z5111 Encounter for antineoplastic chemotherapy: Secondary | ICD-10-CM | POA: Diagnosis not present

## 2021-02-04 DIAGNOSIS — C678 Malignant neoplasm of overlapping sites of bladder: Secondary | ICD-10-CM | POA: Diagnosis not present

## 2021-02-04 DIAGNOSIS — R8271 Bacteriuria: Secondary | ICD-10-CM | POA: Diagnosis not present

## 2021-02-04 DIAGNOSIS — Z5111 Encounter for antineoplastic chemotherapy: Secondary | ICD-10-CM | POA: Diagnosis not present

## 2021-02-13 DIAGNOSIS — G4737 Central sleep apnea in conditions classified elsewhere: Secondary | ICD-10-CM | POA: Diagnosis not present

## 2021-02-13 DIAGNOSIS — G4733 Obstructive sleep apnea (adult) (pediatric): Secondary | ICD-10-CM | POA: Diagnosis not present

## 2021-02-17 DIAGNOSIS — R350 Frequency of micturition: Secondary | ICD-10-CM | POA: Diagnosis not present

## 2021-02-17 DIAGNOSIS — C678 Malignant neoplasm of overlapping sites of bladder: Secondary | ICD-10-CM | POA: Diagnosis not present

## 2021-02-17 DIAGNOSIS — R3915 Urgency of urination: Secondary | ICD-10-CM | POA: Diagnosis not present

## 2021-02-20 DIAGNOSIS — N390 Urinary tract infection, site not specified: Secondary | ICD-10-CM | POA: Diagnosis not present

## 2021-02-20 DIAGNOSIS — E039 Hypothyroidism, unspecified: Secondary | ICD-10-CM | POA: Diagnosis not present

## 2021-02-20 DIAGNOSIS — I129 Hypertensive chronic kidney disease with stage 1 through stage 4 chronic kidney disease, or unspecified chronic kidney disease: Secondary | ICD-10-CM | POA: Diagnosis not present

## 2021-02-20 DIAGNOSIS — N1832 Chronic kidney disease, stage 3b: Secondary | ICD-10-CM | POA: Diagnosis not present

## 2021-02-20 DIAGNOSIS — E782 Mixed hyperlipidemia: Secondary | ICD-10-CM | POA: Diagnosis not present

## 2021-02-27 DIAGNOSIS — R809 Proteinuria, unspecified: Secondary | ICD-10-CM | POA: Diagnosis not present

## 2021-02-27 DIAGNOSIS — I129 Hypertensive chronic kidney disease with stage 1 through stage 4 chronic kidney disease, or unspecified chronic kidney disease: Secondary | ICD-10-CM | POA: Diagnosis not present

## 2021-02-27 DIAGNOSIS — N2581 Secondary hyperparathyroidism of renal origin: Secondary | ICD-10-CM | POA: Diagnosis not present

## 2021-02-27 DIAGNOSIS — N184 Chronic kidney disease, stage 4 (severe): Secondary | ICD-10-CM | POA: Diagnosis not present

## 2021-02-27 DIAGNOSIS — C678 Malignant neoplasm of overlapping sites of bladder: Secondary | ICD-10-CM | POA: Diagnosis not present

## 2021-02-27 DIAGNOSIS — C679 Malignant neoplasm of bladder, unspecified: Secondary | ICD-10-CM | POA: Diagnosis not present

## 2021-02-27 DIAGNOSIS — N3000 Acute cystitis without hematuria: Secondary | ICD-10-CM | POA: Diagnosis not present

## 2021-02-27 DIAGNOSIS — R8271 Bacteriuria: Secondary | ICD-10-CM | POA: Diagnosis not present

## 2021-02-27 DIAGNOSIS — R310 Gross hematuria: Secondary | ICD-10-CM | POA: Diagnosis not present

## 2021-02-27 DIAGNOSIS — R778 Other specified abnormalities of plasma proteins: Secondary | ICD-10-CM | POA: Diagnosis not present

## 2021-02-27 DIAGNOSIS — R319 Hematuria, unspecified: Secondary | ICD-10-CM | POA: Diagnosis not present

## 2021-03-05 DIAGNOSIS — I10 Essential (primary) hypertension: Secondary | ICD-10-CM | POA: Diagnosis not present

## 2021-03-05 DIAGNOSIS — E782 Mixed hyperlipidemia: Secondary | ICD-10-CM | POA: Diagnosis not present

## 2021-03-05 DIAGNOSIS — N1832 Chronic kidney disease, stage 3b: Secondary | ICD-10-CM | POA: Diagnosis not present

## 2021-03-12 DIAGNOSIS — E782 Mixed hyperlipidemia: Secondary | ICD-10-CM | POA: Diagnosis not present

## 2021-03-12 DIAGNOSIS — R7309 Other abnormal glucose: Secondary | ICD-10-CM | POA: Diagnosis not present

## 2021-03-12 DIAGNOSIS — N3289 Other specified disorders of bladder: Secondary | ICD-10-CM | POA: Diagnosis not present

## 2021-03-12 DIAGNOSIS — I10 Essential (primary) hypertension: Secondary | ICD-10-CM | POA: Diagnosis not present

## 2021-03-12 DIAGNOSIS — N184 Chronic kidney disease, stage 4 (severe): Secondary | ICD-10-CM | POA: Diagnosis not present

## 2021-03-13 ENCOUNTER — Telehealth: Payer: Self-pay | Admitting: Cardiology

## 2021-03-13 NOTE — Telephone Encounter (Signed)
4.21.2022 LVM on cell to schedule 6 m fu w/Dr Harrell Gave.Serafina Royals

## 2021-03-19 ENCOUNTER — Emergency Department (HOSPITAL_BASED_OUTPATIENT_CLINIC_OR_DEPARTMENT_OTHER)
Admission: EM | Admit: 2021-03-19 | Discharge: 2021-03-19 | Disposition: A | Discharge status: Home / Self Care | Payer: Medicare Other | Attending: Emergency Medicine | Admitting: Emergency Medicine

## 2021-03-19 ENCOUNTER — Encounter (HOSPITAL_BASED_OUTPATIENT_CLINIC_OR_DEPARTMENT_OTHER): Payer: Self-pay | Admitting: Emergency Medicine

## 2021-03-19 ENCOUNTER — Other Ambulatory Visit: Payer: Self-pay

## 2021-03-19 DIAGNOSIS — E039 Hypothyroidism, unspecified: Secondary | ICD-10-CM | POA: Diagnosis not present

## 2021-03-19 DIAGNOSIS — S50351A Superficial foreign body of right elbow, initial encounter: Secondary | ICD-10-CM | POA: Diagnosis not present

## 2021-03-19 DIAGNOSIS — L989 Disorder of the skin and subcutaneous tissue, unspecified: Secondary | ICD-10-CM

## 2021-03-19 DIAGNOSIS — X58XXXA Exposure to other specified factors, initial encounter: Secondary | ICD-10-CM | POA: Insufficient documentation

## 2021-03-19 DIAGNOSIS — H6123 Impacted cerumen, bilateral: Secondary | ICD-10-CM | POA: Insufficient documentation

## 2021-03-19 DIAGNOSIS — Z8551 Personal history of malignant neoplasm of bladder: Secondary | ICD-10-CM | POA: Diagnosis not present

## 2021-03-19 DIAGNOSIS — I129 Hypertensive chronic kidney disease with stage 1 through stage 4 chronic kidney disease, or unspecified chronic kidney disease: Secondary | ICD-10-CM | POA: Insufficient documentation

## 2021-03-19 DIAGNOSIS — N183 Chronic kidney disease, stage 3 unspecified: Secondary | ICD-10-CM | POA: Diagnosis not present

## 2021-03-19 DIAGNOSIS — Z79899 Other long term (current) drug therapy: Secondary | ICD-10-CM | POA: Diagnosis not present

## 2021-03-19 DIAGNOSIS — Z7982 Long term (current) use of aspirin: Secondary | ICD-10-CM | POA: Diagnosis not present

## 2021-03-19 NOTE — ED Triage Notes (Signed)
Pt states he has something in his right elbow that he noticed yesterday  Denies pain to the area Pt has a dark spot noted on his elbow  Pt also requests that we possibly irrigate his ear if possible

## 2021-03-19 NOTE — ED Notes (Signed)
Ear irrigation performed with positive results. Some cerumen noted in irrigation drainage. Patient states some relief.

## 2021-03-19 NOTE — ED Provider Notes (Signed)
[name] EMERGENCY DEPARTMENT Provider Note   CSN: [id number] Arrival date & time: 03/19/21  0277     History Chief Complaint  Patient presents with  . foreign object in elbow     [name] [name] is a 87 y.o. male.  HPI 87 year old male presents with a skin lesion to his right elbow.  He noticed it yesterday in the mirror when he had his arms out.  Is the first time he is ever noticed that.  He has no idea how long its been there.  He went to make sure it was not a tick or something else abnormal.  It does not itch or hurt.  No pain to his elbow in general.  No trauma.  In addition, he also has been having some right ear discomfort and thinks there is wax buildup.  Past Medical History:  Diagnosis Date  . Arthritis    lower back bulging disc, hips, knees, thumbs, shoulder  . Bladder cancer ([place]) UROLOGIST-  DR MCKENZIE   S/P TURBT 08-16-2017  . BPH (benign prostatic hyperplasia)   . CKD (chronic kidney disease), stage III ([place])    STAGE 3  B CKD per lov 10-16-2020 dr Rica Mast Jeri Cos kidney on chart  . Fatigue    MIDDLE OF DAY ON OCCASION  . Frequency of urination   . GERD (gastroesophageal reflux disease)    on protonix  . Hematuria   . History of colon polyps   . History of dysphagia resolved  . HLD (hyperlipidemia)   . HOH (hard of hearing)    slightly hoh  . HTN (hypertension)   . Hypothyroidism   . Incontinence of urine    only wears protection if he's planning to be in public for an extended amt of time  . 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 Sleep apnea    uses cpap  . UTI (urinary tract infection) 08/20/2020  . Weakness of extremity    left knee  . Wears glasses     Patient Active Problem List   Diagnosis Date Noted  . Chest pain 08/20/2020  . Essential hypertension 05/23/2020  . Sleep apnea  treated with nocturnal BiPAP 01/25/2020  . 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 Hypersomnia with sleep apnea 09/15/2019  . Cardiac arrhythmia due to premature depolarization 09/15/2019  . Excessive daytime sleepiness 09/15/2019  . CKD (chronic kidney disease) stage 3, GFR 30-59 ml/min (HCC) 09/15/2019  . Malignant neoplasm of urinary bladder ([place]) 09/15/2019  . Gross hematuria 09/11/2017    Past Surgical History:  Procedure Laterality Date  . BCG X 2     AFTER LAST 2 BLADDER TUMOR REMOVALS  . COLONSCOPY  YRS AGO  . CYSTOSCOPY N/A 10/29/2020   Procedure: CYSTOSCOPY;  Surgeon: Robley Fries, MD;  Location: [facility];  Service: Urology;  Laterality: N/A;  . CYSTOSCOPY W/ URETERAL STENT PLACEMENT Bilateral 08/16/2017   Procedure: CYSTOSCOPY WITH RETROGRADE PYELOGRAM/URETERAL STENT PLACEMENT;  Surgeon: Cleon Gustin, MD;  Location: [facility];  Service: Urology;  Laterality: Bilateral;  . CYSTOSCOPY WITH BIOPSY N/A 06/25/2020   Procedure: CYSTOSCOPY FULGURATION   BLADDER  BIOPSY;  Surgeon: Robley Fries, MD;  Location: [facility];  Service: Urology;  Laterality: N/A;  . CYSTOSCOPY WITH FULGERATION N/A 12/08/2019   Procedure: CYSTOSCOPY WITH FULGERATION;  Surgeon:  McKenzie, Candee Furbish, MD;  Location: [facility];  Service: Urology;  Laterality: N/A;  . KNEE ARTHROSCOPY Left 06/10/2011  . PROSTATE BIOPSY  2000  . TRANSURETHRAL RESECTION OF BLADDER TUMOR N/A 08/16/2017   Procedure: TRANSURETHRAL RESECTION OF BLADDER TUMOR (TURBT);  Surgeon: Cleon Gustin, MD;  Location[facility];  Service: Urology;  Laterality: N/A;  . TRANSURETHRAL RESECTION OF BLADDER TUMOR  12-09-2012   dr Alona Bene [facility]   and TURP  . TRANSURETHRAL RESECTION OF BLADDER TUMOR N/A 09/20/2017   Procedure: TRANSURETHRAL RESECTION OF BLADDER TUMOR (TURBT);  Surgeon: Cleon Gustin, MD;  Location: [facility];  Service: Urology;  Laterality: N/A;  . TRANSURETHRAL RESECTION OF BLADDER TUMOR N/A 12/08/2019   Procedure: TRANSURETHRAL RESECTION OF BLADDER TUMOR;  Surgeon: Cleon Gustin, MD;  Location[facility];  Service: Urology;  Laterality: N/A;  1 HR  . TRANSURETHRAL RESECTION OF BLADDER TUMOR N/A 10/29/2020   Procedure: BLADDER FULGERATION;  Surgeon: Robley Fries, MD;  Location: [facility];  Service: Urology;  Laterality: N/A;  30 MINS  . TRANSURETHRAL RESECTION OF PROSTATE  2011  . UPPER GI ENDOSCOPY  YRS AGO       Family History  Problem Relation Age of Onset  . Hypertension Mother   . Breast cancer Mother   . Other Mother        brain tumor  . Alzheimer's disease Mother   . Emphysema Father        smoker    Social History   Tobacco Use  . Smoking status: Never Smoker  . Smokeless tobacco: Never Used  Vaping Use  . Vaping Use: Never used  Substance Use Topics  . Alcohol use: Not Currently    Alcohol/week: 1.0 - 2.0 standard drink    Types: 1 - 2 Glasses of wine per week  . Drug use: No    Home Medications Prior to Admission medications   Medication Sig Start Date End Date Taking? Authorizing Provider  acetaminophen (TYLENOL) 500 MG tablet Take 1,000 mg by mouth every 6 (six) hours as needed.    [provider]  amLODipine (NORVASC) 10 MG tablet Take 1 tablet (10 mg total) by mouth daily. Patient taking differently: Take 10 mg by mouth at bedtime.  08/23/20   Mercy Riding, MD  ampicillin (PRINCIPEN) 500 MG capsule Take 1 capsule (500 mg total) by mouth 4 (four) times daily. 10/29/20   Robley Fries, MD  Ascorbic Acid (VITAMIN C PO) Take 1 tablet by mouth 2 (two) times daily.    [provider]  aspirin 81 MG tablet Take 81 mg by mouth at bedtime.     [provider]  finasteride (PROSCAR) 5 MG tablet TAKE ONE TABLET BY MOUTH DAILY 10/28/20   McKenzie, Candee Furbish, MD  levothyroxine  (SYNTHROID, LEVOTHROID) 50 MCG tablet Take 50 mcg by mouth daily before breakfast.  12/25/15   [provider]  mirabegron ER (MYRBETRIQ) 25 MG TB24 tablet Take 25 mg by mouth at bedtime.    [provider]  Niacin (VITAMIN B-3 PO) Take 500 mcg by mouth.    [provider]  pantoprazole (PROTONIX) 40 MG tablet Take 40 mg by mouth at bedtime.  12/20/15   [provider]  phenazopyridine (PYRIDIUM) 200 MG tablet Take 1 tablet (200 mg total) by mouth 3 (three) times daily as needed for pain. 06/25/20 06/25/21  Robley Fries, MD  psyllium (METAMUCIL) 58.6 % powder Take 1 packet by mouth every evening. WITH MEDS    [provider]  rosuvastatin (CRESTOR) 5 MG tablet Take 5 mg by mouth at bedtime.    [provider]  silodosin (RAPAFLO) 8 MG CAPS capsule TAKE ONE CAPSULE BY MOUTH EVERY NIGHT AT BEDTIME Patient taking differently: Take 8 mg by mouth daily.  07/25/20   McKenzie, Candee Furbish, MD  zinc gluconate 50 MG tablet Take 500 mg by mouth daily.    [provider]    Allergies    Macrobid [nitrofurantoin], Ciprofloxacin, and Cephalexin  Review of Systems   Review of Systems  HENT: Positive for ear pain.   Skin: Positive for rash.    Physical Exam Updated Vital Signs BP (!) 153/75   Pulse 79   Temp 97.7 F (36.5 C) (Oral)   Resp 16   Ht 5\' 8"  (1.727 m)   Wt 81.6 kg   SpO2 100%   BMI 27.37 kg/m   Physical Exam Vitals and nursing note reviewed.  Constitutional:      Appearance: He is well-developed.  HENT:     Head: Normocephalic and atraumatic.     Right Ear: External ear normal.     Left Ear: External ear normal.     Ears:     Comments: Bilateral cerumen blocking TMs    Nose: Nose normal.  Eyes:     General:        Right eye: No discharge.        Left eye: No discharge.  Pulmonary:     Effort: Pulmonary effort is normal.  Abdominal:     General: There is no distension.  Skin:    General: Skin is warm and dry.      Comments: On the posterior right elbow is a very small raised blue lesion. It is not tender.   Neurological:     Mental Status: He is alert.  Psychiatric:        Mood and Affect: Mood is not anxious.     ED Results / Procedures / Treatments   Labs (all labs ordered are listed, but only abnormal results are displayed) Labs Reviewed - No data to display  EKG None  Radiology No results found.  Procedures Procedures   Medications Ordered in ED Medications - No data to display  ED Course  I have reviewed the triage vital signs and the nursing notes.  Pertinent labs & imaging results that were available during my care of the patient were reviewed by me and considered in my medical decision making (see chart for details).    MDM Rules/Calculators/A&P                          Unclear what this lesion is on his elbow or how long it has been there.  No decreased range of motion or pain to the elbow.  The site itself is not painful.  He will need to see a dermatologist as I do not know what this is.  As for his ears, they were irrigated by the nurse and has had decent cerumen removal and he is hearing better and feeling better.  Will discharge to follow-up with his dermatologist. Final Clinical Impression(s) / ED Diagnoses Final diagnoses:  Skin lesion  Bilateral impacted cerumen    Rx / DC Orders ED Discharge Orders    None       Sherwood Gambler, MD  03/19/21 0839  

## 2021-03-22 DIAGNOSIS — I129 Hypertensive chronic kidney disease with stage 1 through stage 4 chronic kidney disease, or unspecified chronic kidney disease: Secondary | ICD-10-CM | POA: Diagnosis not present

## 2021-03-22 DIAGNOSIS — N1832 Chronic kidney disease, stage 3b: Secondary | ICD-10-CM | POA: Diagnosis not present

## 2021-03-22 DIAGNOSIS — E039 Hypothyroidism, unspecified: Secondary | ICD-10-CM | POA: Diagnosis not present

## 2021-03-22 DIAGNOSIS — E782 Mixed hyperlipidemia: Secondary | ICD-10-CM | POA: Diagnosis not present

## 2021-03-26 ENCOUNTER — Ambulatory Visit: Payer: Medicare Other | Admitting: Dermatology

## 2021-03-26 ENCOUNTER — Other Ambulatory Visit: Payer: Self-pay

## 2021-03-26 ENCOUNTER — Encounter: Payer: Self-pay | Admitting: Dermatology

## 2021-03-26 DIAGNOSIS — D1801 Hemangioma of skin and subcutaneous tissue: Secondary | ICD-10-CM | POA: Diagnosis not present

## 2021-03-26 DIAGNOSIS — D485 Neoplasm of uncertain behavior of skin: Secondary | ICD-10-CM

## 2021-03-26 DIAGNOSIS — Z1283 Encounter for screening for malignant neoplasm of skin: Secondary | ICD-10-CM

## 2021-03-26 DIAGNOSIS — C44319 Basal cell carcinoma of skin of other parts of face: Secondary | ICD-10-CM | POA: Diagnosis not present

## 2021-03-26 DIAGNOSIS — C4491 Basal cell carcinoma of skin, unspecified: Secondary | ICD-10-CM

## 2021-03-26 HISTORY — DX: Basal cell carcinoma of skin, unspecified: C44.91

## 2021-03-26 NOTE — Patient Instructions (Signed)

## 2021-04-03 ENCOUNTER — Telehealth: Payer: Self-pay | Admitting: *Deleted

## 2021-04-03 NOTE — Telephone Encounter (Signed)
-----   Message from Lavonna Monarch, MD sent at 04/03/2021  6:34 AM EDT ----- Schedule surgery with Dr. Darene Lamer

## 2021-04-03 NOTE — Telephone Encounter (Signed)
Left message for patient to return our phone call.

## 2021-04-07 ENCOUNTER — Encounter: Payer: Self-pay | Admitting: Dermatology

## 2021-04-07 NOTE — Progress Notes (Signed)
Cardiology Office Note:    Date:  04/09/2021   ID:  RAYLON LAMSON, DOB 07-16-1932, MRN [id number]  PCP:  [name], MD  Cardiologist:  Buford Dresser, MD  Referring MD: [name], MD   CC: follow up  History of Present Illness:    [name] [name] is a 87 y.o. male with a hx of sleep apnea on BiPAP, bladder cancer who is seen for follow up today. I initially met him 04/10/20  as a new consult at the request of [name], MD for the evaluation and management of lightheadedness and palpitations.  Today: Since his last visit, he has lost considerable strength in his legs, and now requires a railing to climb stairs. He needs to lean forward and concentrate on lifting his legs. Climbing stairs regularly causes shortness of breath as well. Throughout the day, he is becoming very fatigued at times and notes his blood pressure has been low (116/53) during these times. He denies any chest pain or palpitations. No headaches, lightheadedness, or syncope to report. Also has no lower extremity edema, orthopnea or PND. He does not notice any blood in his stool.  His weight has been stable around 180 lbs for the past 6 months. However, prior to this he steadily lost about 20 pounds unintentionally. He states he is not a good cook and that he "has trouble feeding himself sometimes." He regularly uses eggs and barley flakes, and eats [employer] cereals with nuts/raisins. Meats are mainly limited to chicken and fish.  For exercise he walks about a mile every day. He has some pain in his left knee due to arthritis, and can radiate especially while sleeping. His knee is often stiff in the morning.      Past Medical History:  Diagnosis Date  . Arthritis    lower back bulging disc, hips, knees, thumbs, shoulder  . Bladder cancer ([place]) UROLOGIST-  DR MCKENZIE   S/P TURBT 08-16-2017  . BPH (benign prostatic hyperplasia)   . CKD (chronic kidney disease), stage  III ([place])    STAGE 3  B CKD per lov 10-16-2020 dr Rica Mast Jeri Cos kidney on chart  . Fatigue    MIDDLE OF DAY ON OCCASION  . Frequency of urination   . GERD (gastroesophageal reflux disease)    on protonix  . Hematuria   . History of colon polyps   . History of dysphagia resolved  . HLD (hyperlipidemia)   . HOH (hard of hearing)    slightly hoh  . HTN (hypertension)   . Hypothyroidism   . Incontinence of urine    only wears protection if he's planning to be in public for an extended amt of time  . 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 Sleep apnea    uses cpap  . UTI (urinary tract infection) 08/20/2020  . Weakness of extremity    left knee  . Wears glasses     Past Surgical History:  Procedure Laterality Date  . BCG X 2     AFTER LAST 2 BLADDER TUMOR REMOVALS  . COLONSCOPY  YRS AGO  . CYSTOSCOPY N/A 10/29/2020   Procedure: CYSTOSCOPY;  Surgeon: Robley Fries, MD;  Location: [facility];  Service: Urology;  Laterality: N/A;  . CYSTOSCOPY W/ URETERAL STENT PLACEMENT Bilateral 08/16/2017   Procedure: CYSTOSCOPY WITH RETROGRADE PYELOGRAM/URETERAL STENT PLACEMENT;  Surgeon: Cleon Gustin, MD;  Location: [facility];  Service: Urology;  Laterality: Bilateral;  . CYSTOSCOPY WITH BIOPSY N/A 06/25/2020   Procedure: CYSTOSCOPY FULGURATION   BLADDER  BIOPSY;  Surgeon: Robley Fries, MD;  Location: [facility];  Service: Urology;  Laterality: N/A;  . CYSTOSCOPY WITH FULGERATION N/A 12/08/2019   Procedure: CYSTOSCOPY WITH FULGERATION;  Surgeon: Cleon Gustin, MD;  Location: [facility];  Service: Urology;  Laterality: N/A;  . KNEE ARTHROSCOPY Left 06/10/2011  . PROSTATE BIOPSY  2000  . TRANSURETHRAL RESECTION OF BLADDER TUMOR N/A 08/16/2017   Procedure: TRANSURETHRAL  RESECTION OF BLADDER TUMOR (TURBT);  Surgeon: Cleon Gustin, MD;  Location: [facility];  Service: Urology;  Laterality: N/A;  . TRANSURETHRAL RESECTION OF BLADDER TUMOR  12-09-2012   dr Alona Bene [facility]   and TURP  . TRANSURETHRAL RESECTION OF BLADDER TUMOR N/A 09/20/2017   Procedure: TRANSURETHRAL RESECTION OF BLADDER TUMOR (TURBT);  Surgeon: Cleon Gustin, MD;  [facility]: [facility];  Service: Urology;  Laterality: N/A;  . TRANSURETHRAL RESECTION OF BLADDER TUMOR N/A 12/08/2019   Procedure: TRANSURETHRAL RESECTION OF BLADDER TUMOR;  Surgeon: Cleon Gustin, MD;  Location: [facility];  Service: Urology;  Laterality: N/A;  1 HR  . TRANSURETHRAL RESECTION OF BLADDER TUMOR N/A 10/29/2020   Procedure: BLADDER FULGERATION;  Surgeon: Robley Fries, MD;  Location: [facility];  Service: Urology;  Laterality: N/A;  30 MINS  . TRANSURETHRAL RESECTION OF PROSTATE  2011  . UPPER GI ENDOSCOPY  YRS AGO    Current Medications: Current Outpatient Medications on File Prior to Visit  Medication Sig  . acetaminophen (TYLENOL) 500 MG tablet Take 1,000 mg by mouth every 6 (six) hours as needed.  Marland Kitchen amLODipine (NORVASC) 10 MG tablet Take 1 tablet (10 mg total) by mouth daily. (Patient taking differently: Take 10 mg by mouth at bedtime.)  . ampicillin (PRINCIPEN) 500 MG capsule Take 1 capsule (500 mg total) by mouth 4 (four) times daily.  . Ascorbic Acid (VITAMIN C PO) Take 1 tablet by mouth 2 (two) times daily.  Marland Kitchen aspirin 81 MG tablet Take 81 mg by mouth at bedtime.   . finasteride (PROSCAR) 5 MG tablet TAKE ONE TABLET BY MOUTH DAILY  . levothyroxine (SYNTHROID, LEVOTHROID) 50 MCG tablet Take 50 mcg by mouth daily before breakfast.  . mirabegron ER (MYRBETRIQ) 25 MG TB24 tablet Take 25 mg by mouth at bedtime.  . Niacin (VITAMIN B-3 PO) Take 500 mcg by mouth.  . pantoprazole (PROTONIX) 40 MG tablet Take 40 mg by mouth at bedtime.   . psyllium (METAMUCIL) 58.6 % powder Take 1 packet by mouth every evening. WITH MEDS  . rosuvastatin (CRESTOR) 5 MG tablet Take 5 mg by mouth at bedtime.  . silodosin (RAPAFLO) 8 MG CAPS capsule TAKE ONE CAPSULE BY MOUTH EVERY NIGHT AT BEDTIME (Patient taking differently: Take 8 mg by mouth daily.)  . zinc gluconate 50 MG tablet Take 500 mg by mouth daily.   No current facility-administered medications on file prior to visit.     Allergies:   Macrobid [nitrofurantoin], Ciprofloxacin, and Cephalexin   Social History   Tobacco Use  . Smoking status: Never Smoker  . Smokeless tobacco: Never Used  Vaping Use  . Vaping Use: Never used  Substance Use Topics  . Alcohol use: Not Currently    Alcohol/week: 1.0 - 2.0 standard drink    Types:  1 - 2 Glasses of wine per week  . Drug use: No    Family History: family history includes Alzheimer's disease in his mother; Breast cancer in his mother; Emphysema in his father; Hypertension in his mother; Other in his mother. son had "veins cleaned out" 03/2020. Brother with history of heart disease. Mother and mat gma had heart issues as well.  ROS:   Please see the history of present illness.   (+) Significant LE weakness (+) Fatigue (+) Unintentional weight loss (+) Left knee pain/stiffness (+) Shortness of breath Additional pertinent ROS otherwise unremarkable.   EKGs/Labs/Other Studies Reviewed:    The following studies were reviewed today:  Lexiscan nuclear stress test 08/22/20 IMPRESSION: 1. No reversible ischemia or infarction.  2. Normal left ventricular wall motion.  3. Left ventricular ejection fraction 61%  4. Non invasive risk stratification*: Low  Echo 05/03/20 1. Left ventricular ejection fraction, by estimation, is 55 to 60%. The  left ventricle has normal function. The left ventricle has no regional  wall motion abnormalities. There is mild concentric left ventricular  hypertrophy. Left ventricular diastolic   parameters were normal.  2. Right ventricular systolic function is normal. The right ventricular  size is normal.  3. Left atrial size was mildly dilated.  4. The mitral valve is normal in structure. Trivial mitral valve  regurgitation. No evidence of mitral stenosis.  5. The aortic valve is tricuspid. Aortic valve regurgitation is mild.  Mild aortic valve sclerosis is present, with no evidence of aortic valve  stenosis.  6. There is mild dilatation of the ascending aorta measuring 39 mm.  7. The inferior vena cava is normal in size with greater than 50%  respiratory variability, suggesting right atrial pressure of 3 mmHg.   ETT 05/24/20  Blood pressure demonstrated a hypertensive response to exercise.  Upsloping ST segment depression ST segment depression was noted during stress in the III, II, aVF, V6, V5 and V4 leads.  Negative, adequate stress test. Baseline Vitals  Rest HR 71 bpm    Rest BP 161/83 mmHg    Exercise Time  Exercise duration (min) 6 min    Exercise duration (sec) 0 sec    Peak Stress Vitals  Peak HR 144 bpm    Peak BP 217/89 mmHg    Exercise Data  MPHR 135 bpm    Percent HR 106 %    Estimated workload 7 METS     EKG:  Personally reviewed. 04/09/2021: EKG is not ordered today. 08/20/20: sinus tachycardia at 105 bpm  Recent Labs: 08/20/2020: B Natriuretic Peptide 87.5 08/22/2020: ALT 25 08/23/2020: BUN 30; Creatinine, Ser 1.96; Hemoglobin 11.9; Magnesium 2.3; Platelets 272; Potassium 4.0; Sodium 137  Recent Lipid Panel    Component Value Date/Time   CHOL 117 08/20/2020 1908   TRIG 54 08/20/2020 1908   HDL 43 08/20/2020 1908   CHOLHDL 2.7 08/20/2020 1908   VLDL 11 08/20/2020 1908   LDLCALC 63 08/20/2020 1908    Physical Exam:    VS:  BP 134/62   Pulse 72   Ht '5\' 8"'  (1.727 m)   Wt 182 lb 3.2 oz (82.6 kg)   SpO2 100%   BMI 27.70 kg/m     Wt Readings from Last 3 Encounters:  04/09/21 182 lb 3.2 oz (82.6 kg)  03/19/21 180 lb (81.6  kg)  10/29/20 190 lb 8 oz (86.4 kg)    GEN: Well nourished, well developed in no acute distress HEENT: Normal, moist mucous membranes NECK:  No JVD CARDIAC: regular rhythm, normal S1 and S2, no rubs or gallops. No murmur. VASCULAR: Radial and DP pulses 2+ bilaterally. No carotid bruits RESPIRATORY:  Clear to auscultation without rales, wheezing or rhonchi  ABDOMEN: Soft, non-tender, non-distended MUSCULOSKELETAL:  Ambulates independently SKIN: Warm and dry, no edema NEUROLOGIC:  Alert and oriented x 3. No focal neuro deficits noted. Strength in lower extremities 5/5 bilaterally. PSYCHIATRIC:  Normal affect   ASSESSMENT:    1. Essential hypertension   2. Unintentional weight loss   3. Leg weakness, bilateral   4. Nutritional counseling   5. Sleep apnea treated with nocturnal BiPAP   6. Cardiac risk counseling    PLAN:    Leg weakness, bilateral Walk 1+ mile/day without issues, but notices that he doesn't have the strength to go up stairs like he used to--he can make it up but has to hang on to the railing. Feels like he has to lean forward and concentrate to use stairs. No objective weakness on exam. Counseled on red flag warning signs that need immediate medical attention.  Essential hypertension Brings logs from home, excellent control. Range 108/61-146/63, average 110s/60. Continue current medications (amlodipine, irbesartan)  Nutritional counseling Discussed at length today, both in the setting of CKD and hypertension.  Unintentional weight loss Went from 205 lbs to 180 lbs, but has stabilized at 180 lbs the last 6 months. Discussed diet, cooking. Has stabilized since finishing BCG treatment.  Hyperlipidemia, unspecified: -continue rosuvastatin  Cardiac risk counseling and prevention recommendations: -recommend heart healthy/Mediterranean diet, with whole grains, fruits, vegetable, fish, lean meats, nuts, and olive oil. Limit salt. -recommend moderate walking, 3-5  times/week for 30-50 minutes each session. Aim for at least 150 minutes.week. Goal should be pace of 3 miles/hours, or walking 1.5 miles in 30 minutes -recommend avoidance of tobacco products. Avoid excess alcohol.  -on aspirin for primary prevention, on hold for procedure. No issues with this. We discussed stopping aspirin given age, indication, but he would like to continue  Plan for follow up: 1 year or sooner as needed  Buford Dresser, MD, PhD, [facility]    Medication Adjustments/Labs and Tests Ordered: Current medicines are reviewed at length with the patient today.  Concerns regarding medicines are outlined above.  No orders of the defined types were placed in this encounter.  No orders of the defined types were placed in this encounter.   Patient Instructions  Medication Instructions:  Your Physician recommend you continue on your current medication as directed.    *If you need a refill on your cardiac medications before your next appointment, please call your pharmacy*   Lab Work: None   Testing/Procedures: None   Follow-Up: At [facility], you and your health needs are our priority.  As part of our continuing mission to provide you with exceptional heart care, we have created designated Provider Care Teams.  These Care Teams include your primary Cardiologist (physician) and Advanced Practice Providers (APPs -  Physician Assistants and Nurse Practitioners) who all work together to provide you with the care you need, when you need it.  We recommend signing up for the patient portal called "MyChart".  Sign up information is provided on this After Visit Summary.  MyChart is used to connect with patients for Virtual Visits (Telemedicine).  Patients are able to view lab/test results, encounter notes, upcoming appointments, etc.  Non-urgent messages can be sent to your provider as well.   To learn more about what you  can do with MyChart, go to  [email or web address].    Your next appointment:   1 year(s) @ [address] [place], [place] [address]   The format for your next appointment:   In Person  Provider:   Buford Dresser, MD        [facility] Stumpf,acting as a scribe for Buford Dresser, MD.,have documented all relevant documentation on the behalf of Buford Dresser, MD,as directed by  Buford Dresser, MD while in the presence of Buford Dresser, MD.  I, Buford Dresser, MD, have reviewed all documentation for this visit. The documentation on 04/09/21 for the exam, diagnosis, procedures, and orders are all accurate and complete.  Signed, Buford Dresser, MD PhD 04/09/2021    [facility]

## 2021-04-07 NOTE — Progress Notes (Signed)
   Follow-Up Visit   Subjective  [name] [name] is a 87 y.o. male who presents for the following: Annual Exam (New lesion x months-no itch no bleed).  Enlarging growth on right cheek plus bump on right arm.  Also wants general skin check. Location:  Duration:  Quality:  Associated Signs/Symptoms: Modifying Factors:  Severity:  Timing: Context:   Objective  Well appearing patient in no apparent distress; mood and affect are within normal limits. Objective  Right Elbow - Posterior: Focally eroded palpable 8 mm papule; dermoscopy supports traumatized hemangioma but will biopsy to be certain     Objective  Right Buccal Cheek: Pearly 8 mm papule with focal erosion typical of BCC.     Objective  Mid Back: Waist up skin examination.  Other than 2 spots biopsied on arm and cheek, no other lesions suspicious for skin cancer.  Multiple small actinic keratoses sun exposed areas which are historically stable and not bothersome to patient; no intervention initiated.    All skin waist up examined.   Assessment & Plan    Neoplasm of uncertain behavior of skin (2) Right Elbow - Posterior  Skin / nail biopsy Type of biopsy: tangential   Informed consent: discussed and consent obtained   Timeout: patient name, date of birth, surgical site, and procedure verified   Anesthesia: the lesion was anesthetized in a standard fashion   Anesthetic:  1% lidocaine w/ epinephrine 1-100,000 local infiltration Instrument used: flexible razor blade   Hemostasis achieved with: ferric subsulfate   Outcome: patient tolerated procedure well   Post-procedure details: wound care instructions given    Specimen 1 - Surgical pathology Differential Diagnosis: scc vs bcc  Check Margins: No  Right Buccal Cheek  Skin / nail biopsy Type of biopsy: tangential   Informed consent: discussed and consent obtained   Timeout: patient name, date of birth, surgical site, and procedure verified    Anesthesia: the lesion was anesthetized in a standard fashion   Anesthetic:  1% lidocaine w/ epinephrine 1-100,000 local infiltration Instrument used: flexible razor blade   Hemostasis achieved with: ferric subsulfate   Outcome: patient tolerated procedure well   Post-procedure details: wound care instructions given    Specimen 2 - Surgical pathology Differential Diagnosis: scc vs bcc  Check Margins: No  Screening for malignant neoplasm of skin Mid Back  Annual skin examination.      I, Lavonna Monarch, MD, have reviewed all documentation for this visit.  The documentation on 04/07/21 for the exam, diagnosis, procedures, and orders are all accurate and complete.

## 2021-04-09 ENCOUNTER — Other Ambulatory Visit: Payer: Self-pay

## 2021-04-09 ENCOUNTER — Ambulatory Visit: Payer: Medicare Other | Admitting: Cardiology

## 2021-04-09 ENCOUNTER — Encounter: Payer: Self-pay | Admitting: Cardiology

## 2021-04-09 VITALS — BP 134/62 | HR 72 | Ht 68.0 in | Wt 182.2 lb

## 2021-04-09 DIAGNOSIS — R634 Abnormal weight loss: Secondary | ICD-10-CM | POA: Diagnosis not present

## 2021-04-09 DIAGNOSIS — Z7189 Other specified counseling: Secondary | ICD-10-CM

## 2021-04-09 DIAGNOSIS — Z713 Dietary counseling and surveillance: Secondary | ICD-10-CM

## 2021-04-09 DIAGNOSIS — I10 Essential (primary) hypertension: Secondary | ICD-10-CM

## 2021-04-09 DIAGNOSIS — R29898 Other symptoms and signs involving the musculoskeletal system: Secondary | ICD-10-CM | POA: Diagnosis not present

## 2021-04-09 DIAGNOSIS — G4730 Sleep apnea, unspecified: Secondary | ICD-10-CM | POA: Diagnosis not present

## 2021-04-09 NOTE — Assessment & Plan Note (Signed)
Went from 205 lbs to 180 lbs, but has stabilized at 180 lbs the last 6 months. Discussed diet, cooking. Has stabilized since finishing BCG treatment.

## 2021-04-09 NOTE — Assessment & Plan Note (Addendum)
Walk 1+ mile/day without issues, but notices that he doesn't have the strength to go up stairs like he used to--he can make it up but has to hang on to the railing. Feels like he has to lean forward and concentrate to use stairs. No objective weakness on exam. Counseled on red flag warning signs that need immediate medical attention.

## 2021-04-09 NOTE — Patient Instructions (Signed)
Medication Instructions:  Your Physician recommend you continue on your current medication as directed.    *If you need a refill on your cardiac medications before your next appointment, please call your pharmacy*   Lab Work: None   Testing/Procedures: None   Follow-Up: At [facility], you and your health needs are our priority.  As part of our continuing mission to provide you with exceptional heart care, we have created designated Provider Care Teams.  These Care Teams include your primary Cardiologist (physician) and Advanced Practice Providers (APPs -  Physician Assistants and Nurse Practitioners) who all work together to provide you with the care you need, when you need it.  We recommend signing up for the patient portal called "MyChart".  Sign up information is provided on this After Visit Summary.  MyChart is used to connect with patients for Virtual Visits (Telemedicine).  Patients are able to view lab/test results, encounter notes, upcoming appointments, etc.  Non-urgent messages can be sent to your provider as well.   To learn more about what you can do with MyChart, go to [email or web address].    Your next appointment:   1 year(s) @ [address] [place], [place] [address]  The format for your next appointment:   In Person  Provider:   Bridgette Christopher, MD   

## 2021-04-09 NOTE — Assessment & Plan Note (Signed)
Discussed at length today, both in the setting of CKD and hypertension.

## 2021-04-09 NOTE — Assessment & Plan Note (Signed)
Brings logs from home, excellent control. Range 108/61-146/63, average 110s/60. Continue current medications (amlodipine, irbesartan)

## 2021-04-11 ENCOUNTER — Emergency Department (HOSPITAL_COMMUNITY): Payer: Medicare Other

## 2021-04-11 ENCOUNTER — Emergency Department (HOSPITAL_COMMUNITY)
Admission: EM | Admit: 2021-04-11 | Discharge: 2021-04-11 | Disposition: A | Discharge status: Home / Self Care | Payer: Medicare Other | Attending: Emergency Medicine | Admitting: Emergency Medicine

## 2021-04-11 ENCOUNTER — Encounter (HOSPITAL_COMMUNITY): Payer: Self-pay | Admitting: Emergency Medicine

## 2021-04-11 ENCOUNTER — Other Ambulatory Visit: Payer: Self-pay

## 2021-04-11 DIAGNOSIS — Z20822 Contact with and (suspected) exposure to covid-19: Secondary | ICD-10-CM | POA: Insufficient documentation

## 2021-04-11 DIAGNOSIS — N183 Chronic kidney disease, stage 3 unspecified: Secondary | ICD-10-CM | POA: Insufficient documentation

## 2021-04-11 DIAGNOSIS — I499 Cardiac arrhythmia, unspecified: Secondary | ICD-10-CM | POA: Diagnosis not present

## 2021-04-11 DIAGNOSIS — E039 Hypothyroidism, unspecified: Secondary | ICD-10-CM | POA: Insufficient documentation

## 2021-04-11 DIAGNOSIS — I129 Hypertensive chronic kidney disease with stage 1 through stage 4 chronic kidney disease, or unspecified chronic kidney disease: Secondary | ICD-10-CM | POA: Insufficient documentation

## 2021-04-11 DIAGNOSIS — R7989 Other specified abnormal findings of blood chemistry: Secondary | ICD-10-CM | POA: Diagnosis not present

## 2021-04-11 DIAGNOSIS — R079 Chest pain, unspecified: Secondary | ICD-10-CM | POA: Diagnosis not present

## 2021-04-11 DIAGNOSIS — R0602 Shortness of breath: Secondary | ICD-10-CM | POA: Diagnosis not present

## 2021-04-11 DIAGNOSIS — R6889 Other general symptoms and signs: Secondary | ICD-10-CM | POA: Diagnosis not present

## 2021-04-11 DIAGNOSIS — R0789 Other chest pain: Secondary | ICD-10-CM | POA: Diagnosis not present

## 2021-04-11 DIAGNOSIS — Z743 Need for continuous supervision: Secondary | ICD-10-CM | POA: Diagnosis not present

## 2021-04-11 DIAGNOSIS — Z79899 Other long term (current) drug therapy: Secondary | ICD-10-CM | POA: Insufficient documentation

## 2021-04-11 LAB — HEPATIC FUNCTION PANEL
ALT: 30 U/L (ref 0–44)
AST: 25 U/L (ref 15–41)
Albumin: 4 g/dL (ref 3.5–5.0)
Alkaline Phosphatase: 87 U/L (ref 38–126)
Bilirubin, Direct: 0.1 mg/dL (ref 0.0–0.2)
Indirect Bilirubin: 0.7 mg/dL (ref 0.3–0.9)
Total Bilirubin: 0.8 mg/dL (ref 0.3–1.2)
Total Protein: 7.8 g/dL (ref 6.5–8.1)

## 2021-04-11 LAB — CBC
HCT: 35.4 % — ABNORMAL LOW (ref 39.0–52.0)
Hemoglobin: 11.6 g/dL — ABNORMAL LOW (ref 13.0–17.0)
MCH: 29.4 pg (ref 26.0–34.0)
MCHC: 32.8 g/dL (ref 30.0–36.0)
MCV: 89.8 fL (ref 80.0–100.0)
Platelets: 284 10*3/uL (ref 150–400)
RBC: 3.94 MIL/uL — ABNORMAL LOW (ref 4.22–5.81)
RDW: 14.3 % (ref 11.5–15.5)
WBC: 11.1 10*3/uL — ABNORMAL HIGH (ref 4.0–10.5)
nRBC: 0 % (ref 0.0–0.2)

## 2021-04-11 LAB — PROTIME-INR
INR: 1.1 (ref 0.8–1.2)
Prothrombin Time: 14 seconds (ref 11.4–15.2)

## 2021-04-11 LAB — BASIC METABOLIC PANEL
Anion gap: 9 (ref 5–15)
BUN: 50 mg/dL — ABNORMAL HIGH (ref 8–23)
CO2: 19 mmol/L — ABNORMAL LOW (ref 22–32)
Calcium: 9.1 mg/dL (ref 8.9–10.3)
Chloride: 110 mmol/L (ref 98–111)
Creatinine, Ser: 2.84 mg/dL — ABNORMAL HIGH (ref 0.61–1.24)
GFR, Estimated: 21 mL/min — ABNORMAL LOW (ref >60)
Glucose, Bld: 102 mg/dL — ABNORMAL HIGH (ref 70–99)
Potassium: 4.2 mmol/L (ref 3.5–5.1)
Sodium: 138 mmol/L (ref 135–145)

## 2021-04-11 LAB — RESP PANEL BY RT-PCR (FLU A&B, COVID) ARPGX2
Influenza A by PCR: NEGATIVE
Influenza B by PCR: NEGATIVE
SARS Coronavirus 2 by RT PCR: NEGATIVE

## 2021-04-11 LAB — TROPONIN I (HIGH SENSITIVITY)
Troponin I (High Sensitivity): 6 ng/L (ref <18)
Troponin I (High Sensitivity): 8 ng/L (ref <18)

## 2021-04-11 LAB — C-REACTIVE PROTEIN: CRP: 3.2 mg/dL — ABNORMAL HIGH (ref <1.0)

## 2021-04-11 LAB — SEDIMENTATION RATE: Sed Rate: 42 mm/hr — ABNORMAL HIGH (ref 0–16)

## 2021-04-11 MED ORDER — TECHNETIUM TO 99M ALBUMIN AGGREGATED
4.2000 | Freq: Once | INTRAVENOUS | Status: AC | PRN
  Administered 2021-04-11: 4.2 via INTRAVENOUS

## 2021-04-11 MED ORDER — ACETAMINOPHEN 325 MG PO TABS
650.0000 mg | ORAL_TABLET | Freq: Once | ORAL | Status: AC
  Administered 2021-04-11: 650 mg via ORAL
  Filled 2021-04-11: qty 2

## 2021-04-11 MED ORDER — ALUM & MAG HYDROXIDE-SIMETH 200-200-20 MG/5ML PO SUSP
15.0000 mL | Freq: Once | ORAL | Status: AC
  Administered 2021-04-11: 15 mL via ORAL
  Filled 2021-04-11: qty 30

## 2021-04-11 MED ORDER — SODIUM CHLORIDE 0.9 % IV BOLUS
1000.0000 mL | Freq: Once | INTRAVENOUS | Status: AC
  Administered 2021-04-11: 1000 mL via INTRAVENOUS

## 2021-04-11 NOTE — ED Notes (Signed)
Pt in VQ scan still at this time

## 2021-04-11 NOTE — Discharge Instructions (Addendum)
-  Your COVID and flu tests were negative  -Your work-up today did not show anything to suggest that you are having a heart attack or have a blood clot.  -You need to follow-up with your primary care doctor within the next 5 days to have your kidney function rechecked.  It was elevated today.  Call the office today or first thing Monday morning to schedule follow-up appointment.  -You can take Tylenol as needed for pain at home.  Do not take any ibuprofen, Aleve, Motrin, Advil or Goody powders for pain.  Return to the ER if you have any new or worsening symptoms.

## 2021-04-11 NOTE — ED Provider Notes (Signed)
[facility] EMERGENCY DEPARTMENT Provider Note   CSN: [id number] Arrival date & time: 04/11/21  0305     History Chief Complaint  Patient presents with  . Chest Pain    [name] [name] is a 87 y.o. male with past medical history significant for CKD stage III, sleep apnea on BiPAP, BPH, bladder cancer, hypothyroidism.  Not anticoagulated. Cardiologist is Dr. Harrell Gave. Echo 04/2020 with LV EF 55-60%.  HPI Patient presents to emergency room today with chief complaint of chest pain x1 day.  He noticed the pain yesterday morning after waking up from sleep.  He states the pain was located in the middle of his upper chest and was very mild at the time.  He states throughout the day the pain progressively worsened and started to radiate to his bilateral jaw.  He is also having a headache because of the pain.  He states the headache is very mild.  He states the pain was worse when bending over, ambulating, or taking a deep breath.  He describes the pain as a pressure sensation. He rates pain 7/10 in severity currently. The pain has been constant. He noticed the pain today was worse when laying down flat. He denies history of similar pain.  He is also also admitting to intermittent swelling in his left lower extremity.  He states is been going on for a year and a half and he had a negative DVT study in 2021.  He states during the day if levels follow-up and then overnight while sleeping it will go back to normal.  Patient admits to "sense of taste being off."  No known COVID exposures or sick contacts. He denies any recent illness or URI symptoms. No recent travel or history of PE. Patient took 350m aspirin prior to arrival. He denies fever, chills, cough, hemoptysis, hematemesis, syncope, palpitations, urinary symptoms, numbness, weakness.  He denies any family history of FH CAD <55 yo, tobacco history. He reports history of prediabetes only. Patient states he is followed by  nephrology Dr. BCarolin Sicks  He has known CKD stage III and reports no interventions have yet been performed.  He admits to being seen in office to monitor kidney function.     Past Medical History:  Diagnosis Date  . Arthritis    lower back bulging disc, hips, knees, thumbs, shoulder  . [place] [facility]  DR MCKENZIE   S/P TURBT 08-16-2017  . BPH (benign prostatic hyperplasia)   . CKD (chronic kidney disease), stage III ([place]    STAGE 3  B CKD per lov 10-16-2020 dr dRica MastbJeri Coskidney on chart  . Fatigue    MIDDLE OF DAY ON OCCASION  . Frequency of urination   . GERD (gastroesophageal reflux disease)    on protonix  . Hematuria   . History of colon polyps   . History of dysphagia resolved  . HLD (hyperlipidemia)   . HOH (hard of hearing)    slightly hoh  . HTN (hypertension)   . Hypothyroidism   . Incontinence of urine    only wears protection if he's planning to be in public for an extended amt of time  . 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 Sleep apnea    uses cpap  . UTI (urinary tract infection) 08/20/2020  . Weakness of extremity    left knee  .  Wears glasses     Patient Active Problem List   Diagnosis Date Noted  . Unintentional weight loss 04/09/2021  . Leg weakness, bilateral 04/09/2021  . Nutritional counseling 04/09/2021  . Chest pain 08/20/2020  . Essential hypertension 05/23/2020  . Sleep apnea treated with nocturnal BiPAP 01/25/2020  . 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 Hypersomnia with sleep apnea 09/15/2019  . Cardiac arrhythmia due to premature depolarization 09/15/2019  . Excessive daytime sleepiness 09/15/2019  . CKD (chronic kidney disease) stage 3, GFR 30-59 ml/min (HCC) 09/15/2019  . Malignant neoplasm of urinary bladder ([place]) 09/15/2019  . Gross hematuria 09/11/2017     Past Surgical History:  Procedure Laterality Date  . BCG X 2     AFTER LAST 2 BLADDER TUMOR REMOVALS  . COLONSCOPY  YRS AGO  . CYSTOSCOPY N/A 10/29/2020   Procedure: CYSTOSCOPY;  Surgeon: Robley Fries, MD;  Location[facility];  Service: Urology;  Laterality: N/A;  . CYSTOSCOPY W/ URETERAL STENT PLACEMENT Bilateral 08/16/2017   Procedure: CYSTOSCOPY WITH RETROGRADE PYELOGRAM/URETERAL STENT PLACEMENT;  Surgeon: Cleon Gustin, MD;  Location: [facility];  Service: Urology;  Laterality: Bilateral;  . CYSTOSCOPY WITH BIOPSY N/A 06/25/2020   Procedure: CYSTOSCOPY FULGURATION   BLADDER  BIOPSY;  Surgeon: Robley Fries, MD;  Location[facility];  Service: Urology;  Laterality: N/A;  . CYSTOSCOPY WITH FULGERATION N/A 12/08/2019   Procedure: CYSTOSCOPY WITH FULGERATION;  Surgeon: Cleon Gustin, MD;  Location: [facility];  Service: Urology;  Laterality: N/A;  . KNEE ARTHROSCOPY Left 06/10/2011  . PROSTATE BIOPSY  2000  . TRANSURETHRAL RESECTION OF BLADDER TUMOR N/A 08/16/2017   Procedure: TRANSURETHRAL RESECTION OF BLADDER TUMOR (TURBT);  Surgeon: Cleon Gustin, MD;  Location: [facility];  Service: Urology;  Laterality: N/A;  . TRANSURETHRAL RESECTION OF BLADDER TUMOR  12-09-2012   dr Alona Bene [facility]   and TURP  . TRANSURETHRAL RESECTION OF BLADDER TUMOR N/A 09/20/2017   Procedure: TRANSURETHRAL RESECTION OF BLADDER TUMOR (TURBT);  Surgeon: Cleon Gustin, MD;  Location: [facility];  Service: Urology;  Laterality: N/A;  . TRANSURETHRAL RESECTION OF BLADDER TUMOR N/A 12/08/2019   Procedure: TRANSURETHRAL RESECTION OF BLADDER TUMOR;  Surgeon: Cleon Gustin, MD;  Location: [facility];  Service: Urology;  Laterality: N/A;  1 HR  . TRANSURETHRAL RESECTION OF BLADDER TUMOR N/A 10/29/2020   Procedure: BLADDER FULGERATION;  Surgeon: Robley Fries, MD;   Location: [facility];  Service: Urology;  Laterality: N/A;  30 MINS  . TRANSURETHRAL RESECTION OF PROSTATE  2011  . UPPER GI ENDOSCOPY  YRS AGO       Family History  Problem Relation Age of Onset  . Hypertension Mother   . Breast cancer Mother   . Other Mother        brain tumor  . Alzheimer's disease Mother   . Emphysema Father        smoker    Social History   Tobacco Use  . Smoking status: Never Smoker  . Smokeless tobacco: Never Used  Vaping Use  . Vaping Use: Never used  Substance Use Topics  . Alcohol use: Not Currently    Alcohol/week: 1.0 - 2.0 standard drink    Types: 1 - 2 Glasses of wine per week  . Drug use: No    Home Medications Prior to Admission medications   Medication Sig Start Date End Date  Taking? Authorizing Provider  acetaminophen (TYLENOL) 500 MG tablet Take 1,000 mg by mouth every 6 (six) hours as needed.   Yes [provider]  amLODipine (NORVASC) 10 MG tablet Take 1 tablet (10 mg total) by mouth daily. Patient taking differently: Take 10 mg by mouth at bedtime. 08/23/20  Yes Mercy Riding, MD  ampicillin (PRINCIPEN) 500 MG capsule Take 1 capsule (500 mg total) by mouth 4 (four) times daily. Patient taking differently: Take 500 mg by mouth as needed (urinary tract infection). 10/29/20  Yes PaceSimone Curia D, MD  Ascorbic Acid (VITAMIN C PO) Take 1 tablet by mouth 2 (two) times daily.   Yes [provider]  aspirin 81 MG tablet Take 81 mg by mouth at bedtime.    Yes [provider]  finasteride (PROSCAR) 5 MG tablet TAKE ONE TABLET BY MOUTH DAILY Patient taking differently: Take 5 mg by mouth daily. 10/28/20  Yes McKenzie, Candee Furbish, MD  levothyroxine (SYNTHROID, LEVOTHROID) 50 MCG tablet Take 50 mcg by mouth daily before breakfast. 12/25/15  Yes [provider]  mirabegron ER (MYRBETRIQ) 25 MG TB24 tablet Take 25 mg by mouth at bedtime.   Yes [provider]  Niacin (VITAMIN B-3 PO) Take 500  mcg by mouth.   Yes [provider]  pantoprazole (PROTONIX) 40 MG tablet Take 40 mg by mouth at bedtime. 12/20/15  Yes [provider]  psyllium (METAMUCIL) 58.6 % powder Take 1 packet by mouth every evening. WITH MEDS   Yes [provider]  rosuvastatin (CRESTOR) 5 MG tablet Take 5 mg by mouth at bedtime.   Yes [provider]  silodosin (RAPAFLO) 8 MG CAPS capsule TAKE ONE CAPSULE BY MOUTH EVERY NIGHT AT BEDTIME Patient taking differently: Take 8 mg by mouth daily. 07/25/20  Yes McKenzie, Candee Furbish, MD  zinc gluconate 50 MG tablet Take 500 mg by mouth daily.   Yes [provider]    Allergies    Macrobid [nitrofurantoin], Ciprofloxacin, and Cephalexin  Review of Systems   Review of Systems All other systems are reviewed and are negative for acute change except as noted in the HPI.  Physical Exam Updated Vital Signs BP (!) 145/68   Pulse 82   Temp 98.2 F (36.8 C) (Oral)   Resp 18   Ht _0  (1.727 m)   Wt 90 kg   SpO2 98%   BMI 30.17 kg/m   Physical Exam Vitals and nursing note reviewed.  Constitutional:      General: He is not in acute distress.    Appearance: He is not ill-appearing.  HENT:     Head: Normocephalic and atraumatic.     Comments: No tenderness to palpation of skull. No deformities or crepitus noted. No open wounds, abrasions or lacerations.    Right Ear: Tympanic membrane and external ear normal.     Left Ear: Tympanic membrane and external ear normal.     Nose: Nose normal.     Mouth/Throat:     Mouth: Mucous membranes are moist.     Pharynx: Oropharynx is clear.  Eyes:     General: No scleral icterus.       Right eye: No discharge.        Left eye: No discharge.     Extraocular Movements: Extraocular movements intact.     Conjunctiva/sclera: Conjunctivae normal.     Pupils: Pupils are equal, round, and reactive to light.  Neck:     Vascular: No JVD.  Comments: No meningeal signs Cardiovascular:      Rate and Rhythm: Normal rate and regular rhythm.     Pulses: Normal pulses.          Radial pulses are 2+ on the right side and 2+ on the left side.       Dorsalis pedis pulses are 2+ on the right side and 2+ on the left side.     Heart sounds: Normal heart sounds.  Pulmonary:     Comments: Lungs clear to auscultation in all fields. Symmetric chest rise. No wheezing, rales, or rhonchi. Abdominal:     Comments: Abdomen is soft, non-distended, and non-tender in all quadrants. No rigidity, no guarding. No peritoneal signs.  Musculoskeletal:        General: Normal range of motion.     Cervical back: Normal range of motion.     Right lower leg: No edema.     Comments: Mild edema of LLE that is baseline per patient. Homans sign absent bilaterally, no palpable cords, compartments are soft.   Skin:    General: Skin is warm and dry.     Capillary Refill: Capillary refill takes less than 2 seconds.  Neurological:     Mental Status: He is oriented to person, place, and time.     GCS: GCS eye subscore is 4. GCS verbal subscore is 5. GCS motor subscore is 6.     Comments: Speech is clear and goal oriented, follows commands CN III-XII intact, no facial droop Normal strength in upper and lower extremities bilaterally including dorsiflexion and plantar flexion, strong and equal grip strength Sensation normal to light and sharp touch Moves extremities without ataxia, coordination intact    Psychiatric:        Behavior: Behavior normal.     ED Results / Procedures / Treatments   Labs (all labs ordered are listed, but only abnormal results are displayed) Labs Reviewed  BASIC METABOLIC PANEL - Abnormal; Notable for the following components:      Result Value   CO2 19 (*)    Glucose, Bld 102 (*)    BUN 50 (*)    Creatinine, Ser 2.84 (*)    GFR, Estimated 21 (*)    All other components within normal limits  CBC - Abnormal; Notable for the following components:   WBC 11.1 (*)    RBC 3.94 (*)     Hemoglobin 11.6 (*)    HCT 35.4 (*)    All other components within normal limits  SEDIMENTATION RATE - Abnormal; Notable for the following components:   Sed Rate 42 (*)    All other components within normal limits  C-REACTIVE PROTEIN - Abnormal; Notable for the following components:   CRP 3.2 (*)    All other components within normal limits  RESP PANEL BY RT-PCR (FLU A&B, COVID) [id number]  PROTIME-INR  HEPATIC FUNCTION PANEL  TROPONIN I (HIGH SENSITIVITY)  TROPONIN I (HIGH SENSITIVITY)    EKG EKG Interpretation  Date/Time:  Friday Apr 11 2021 03:11:39 EDT Ventricular Rate:  92 PR Interval:  148 QRS Duration: 88 QT Interval:  358 QTC Calculation: 442 R Axis:   51 Text Interpretation: Sinus rhythm with marked sinus arrhythmia Cannot rule out Anterior infarct , age undetermined Abnormal ECG Confirmed by Lennice Sites [phone]) on 04/11/2021 6:10:26 AM   Radiology DG Chest 2 View  Result Date: 04/11/2021 CLINICAL DATA:  Chest pain and shortness of breath EXAM: CHEST - 2 VIEW COMPARISON:  August 20, 2020  FINDINGS: The heart size and mediastinal contours are within normal limits. Aortic atherosclerosis. Bibasilar linear atelectasis versus scarring. No lobar consolidation. No visible pleural effusion or pneumothorax. Spondylosis. IMPRESSION: Bibasilar linear atelectasis versus scarring. Electronically Signed   By: Dahlia Bailiff MD   On: 04/11/2021 03:34   NM PULMONARY VENT AND PERF (V/Q Scan)  Result Date: 04/11/2021 CLINICAL DATA:  Chest pain and shortness of breath EXAM: NUCLEAR MEDICINE PERFUSION LUNG SCAN TECHNIQUE: Perfusion images were obtained in multiple projections after intravenous injection of radiopharmaceutical. Views: Anterior, posterior, left lateral, right lateral, RPO, LPO, RAO, LAO. RADIOPHARMACEUTICALS:  4.2 mCi Tc-73mMAA IV COMPARISON:  Chest radiograph Apr 11, 2021. FINDINGS: Radiotracer uptake bilaterally is homogeneous and symmetric. No perfusion defects evident.  IMPRESSION: No evident perfusion defects. No findings indicative of pulmonary embolus. Electronically Signed   By: WLowella GripIII M.D.   On: 04/11/2021 11:44    Procedures Procedures   Medications Ordered in ED Medications  sodium chloride 0.9 % bolus 1,000 mL (0 mLs Intravenous Stopped 04/11/21 1046)  acetaminophen (TYLENOL) tablet 650 mg (650 mg Oral Given 04/11/21 1044)  alum & mag hydroxide-simeth (MAALOX/MYLANTA) 200-200-20 MG/5ML suspension 15 mL (15 mLs Oral Given 04/11/21 1044)  technetium albumin aggregated (MAA) injection solution 4.2 millicurie (4.2 millicuries Intravenous Contrast Given 04/11/21 1105)    ED Course  I have reviewed the triage vital signs and the nursing notes.  Pertinent labs & imaging results that were available during my care of the patient were reviewed by me and considered in my medical decision making (see chart for details).    MDM Rules/Calculators/A&P                          History provided by patient with additional history obtained from chart review.    Presenting with CP x 24 hours without significant cardiac history. Patient afebile, HDS. Non toxic appearing. Lungs CTAB, normal work of breathing, no chest tenderness. Does not appear volume overloaded. Negative homann's sign bilaterally.  Work up initiated in triage. CBC with nonspecific leukocytosis 11.1, hemoglobin 11.6 is consistent with baseline. BMP with bicarb 19, worsening kidney function BUN/CR 50/2.84 compared to 7 months ago when it was 30/1.96, GFR is 21. INR within normal range. Delta troponin flat, 6 to 8. EKG without obvious ischemia. Heart score of 4. I viewed pt's chest xray and it does not suggest acute infectious processes. Agree with radiologist impression.  Patient given liter of IV fluids for elevated creatinine. ACS and dissection unlikely cause of pain based on history, exam, and work up. With pleuritic chest pain and history of bladder cancer concern for PE. Patient had  elevated d dimer in the past 4.86.  With this degree of concern for PE VQ scan performed.  Imaging is negative for any signs of a blood clot.  There is also concern for possible gentle cell temporal arteritis given his bilateral jaw pain.  He has no tenderness palpation of temples.  ESR and CRP mildly elevated.  COVID test is negative.  Reassessed patient and he is resting comfortably after receiving Tylenol and a GI cocktail.  He is eager to be discharged home.  Discussed results with ED attending Dr. RAyesha Rumpfwho hass also evaluated patient and she agrees with plan for discharge home.  Patient will need close follow-up with PCP to have creatinine rechecked as it was elevated today. Covid test is negative.  Strict return precautions were discussed.  Patient knows he  will need to have creatinine rechecked by PCP. Patient stable at time od disposition.    Portions of this note were generated with [employer]. Dictation errors may occur despite best attempts at proofreading.    Final Clinical Impression(s) / ED Diagnoses Final diagnoses:  Atypical chest pain  Elevated serum creatinine    Rx / DC Orders ED Discharge Orders    None       Lewanda Rife 04/11/21 1224    Quintella Reichert, MD 04/13/21 1222

## 2021-04-11 NOTE — ED Triage Notes (Signed)
Patient arrived with EMS from home reports central chest pain with SOB , pain increases with deep inspiration , no emesis or diaphoresis , no cough or fever . He received ASA 324 mg prior to arrival by EMS.

## 2021-04-11 NOTE — ED Notes (Signed)
Reviewed discharge instructions with patient. Follow-up care reviewed. Patient verbalized understanding. Patient A&Ox4, VSS, and ambulatory with steady gait upon discharge.  

## 2021-04-14 ENCOUNTER — Telehealth: Payer: Self-pay

## 2021-04-14 NOTE — Telephone Encounter (Signed)
-----   Message from Lavonna Monarch, MD sent at 04/03/2021  6:34 AM EDT ----- Schedule surgery with Dr. Darene Lamer

## 2021-04-14 NOTE — Telephone Encounter (Signed)
Phone call to patient with his pathology results. Patient aware of results.  

## 2021-04-18 DIAGNOSIS — N183 Chronic kidney disease, stage 3 unspecified: Secondary | ICD-10-CM | POA: Diagnosis not present

## 2021-04-22 DIAGNOSIS — N1832 Chronic kidney disease, stage 3b: Secondary | ICD-10-CM | POA: Diagnosis not present

## 2021-04-22 DIAGNOSIS — E782 Mixed hyperlipidemia: Secondary | ICD-10-CM | POA: Diagnosis not present

## 2021-04-22 DIAGNOSIS — I129 Hypertensive chronic kidney disease with stage 1 through stage 4 chronic kidney disease, or unspecified chronic kidney disease: Secondary | ICD-10-CM | POA: Diagnosis not present

## 2021-04-23 DIAGNOSIS — N184 Chronic kidney disease, stage 4 (severe): Secondary | ICD-10-CM | POA: Diagnosis not present

## 2021-04-23 DIAGNOSIS — Z09 Encounter for follow-up examination after completed treatment for conditions other than malignant neoplasm: Secondary | ICD-10-CM | POA: Diagnosis not present

## 2021-04-23 DIAGNOSIS — I10 Essential (primary) hypertension: Secondary | ICD-10-CM | POA: Diagnosis not present

## 2021-04-23 DIAGNOSIS — D508 Other iron deficiency anemias: Secondary | ICD-10-CM | POA: Diagnosis not present

## 2021-04-23 DIAGNOSIS — R7989 Other specified abnormal findings of blood chemistry: Secondary | ICD-10-CM | POA: Diagnosis not present

## 2021-04-24 ENCOUNTER — Telehealth: Payer: Self-pay | Admitting: Cardiology

## 2021-04-24 NOTE — Telephone Encounter (Signed)
Patient calling in to make provider aware that he went to the ED after being seen by his cardiologist due to chest pain. Patient states this lasted a few days but the ED did not find anything concerning. Patient reports no current issues of chest pain or SOB. Patient just wants Dr.Christopher to be aware of this issue ?

## 2021-04-24 NOTE — Telephone Encounter (Addendum)
This RN returned patient's call, patient wanted to make Dr. Harrell Gave aware of his recent ED visit. Patient reports that on 5/20 he woke up with chest pain in the middle of his chest that extended to his neck, he had a headache and was not able to take in a deep breath. The pt called 911 and was seen at the ED where he reports they "didn't find anything" in regard to his heart, but that his creatinine was elevated,  and he was subsequently discharged.   Patient reports today he has no symptoms and feels "well" , pt denies dizziness, denies active chest pain, and denies headache. Pt states he is sometimes slightly short of breath with activity, but says this has been a longstanding issue and that he feels well and is not complaining of symptoms.  Pt reports he saw his PCP yesterday where they also had blood work done where his creatinine was elevated again. His PCP advised him to let Dr. Harrell Gave know about his ED visit and the labs. Pt also reports he has an appointment with [facility] in July.   This RN asked patient if he would like to schedule an appointment to be seen, pt agreeable, this RN made patient an appointment with Dr. Harrell Gave on 05/01/2021 at 8:40am. This RN also let patient know his concerns would be forwarded to Dr. Harrell Gave.  This RN advised patient that if he develops the chest pain again or dizziness, shortness of breath, or any other concerning symptoms, he should call 911 or have someone take him to the nearest emergency room. Pt verbalized understanding.

## 2021-05-01 ENCOUNTER — Other Ambulatory Visit: Payer: Self-pay

## 2021-05-01 ENCOUNTER — Encounter: Payer: Self-pay | Admitting: Cardiology

## 2021-05-01 ENCOUNTER — Ambulatory Visit: Payer: Medicare Other | Admitting: Cardiology

## 2021-05-01 VITALS — BP 128/60 | HR 67 | Ht 68.0 in | Wt 182.6 lb

## 2021-05-01 DIAGNOSIS — R079 Chest pain, unspecified: Secondary | ICD-10-CM | POA: Diagnosis not present

## 2021-05-01 DIAGNOSIS — Z09 Encounter for follow-up examination after completed treatment for conditions other than malignant neoplasm: Secondary | ICD-10-CM

## 2021-05-01 DIAGNOSIS — E785 Hyperlipidemia, unspecified: Secondary | ICD-10-CM

## 2021-05-01 DIAGNOSIS — Z7189 Other specified counseling: Secondary | ICD-10-CM

## 2021-05-01 DIAGNOSIS — I10 Essential (primary) hypertension: Secondary | ICD-10-CM | POA: Diagnosis not present

## 2021-05-01 NOTE — Progress Notes (Signed)
Cardiology Office Note:    Date:  05/02/2021   ID:  [name] [name], DOB 04-29-1932, MRN [id number]  PCP:  [name], MD  Cardiologist:  Buford Dresser, MD  Referring MD: [name], MD   CC: follow up  History of Present Illness:    [name] [name] is a 87 y.o. male with a hx of sleep apnea on BiPAP, bladder cancer who is seen for follow up today. I initially met him 04/10/20  as a new consult at the request of [name], MD for the evaluation and management of lightheadedness and palpitations.  Today: Urgent appt to discuss ER visit 04/11/21. Note reviewed. hsTn 6, 8. VQ scan without evidence on PE. ESR 42, CRP 3.2.  Cr up to 2.84, baseline around 2.  Several days after I saw him last, he had a slowly growing discomfort in his central chest, had difficulty taking a deep breath. Was constant, continued to worsen over several days. Had neck, jaw, tooth pain as well. Had severe headache, couldn't bend over. Went to ER. Started to feel better in the ER, all symptoms disappeared within 1-2 days of leaving the ER. He states it felt like when he had asthma when he was very young. No fevers/chills, no cough or mucus.  Today, no pain. Feels back to baseline. Continues to have chronic loss of leg strength, though still able to walk over a mile in his neighborhood every day. Feels that his legs are more tired than normal after walking. No falls. Has to use a railing to climb steps, used to be able to run up them years ago. Also about once/day has extreme fatigue, needs to nap briefly. Usually when he is trying to read. Using CPAP, feels he sleeps well.  Reviewed prior ETT, nuclear stress test results.   Past Medical History:  Diagnosis Date   Arthritis    lower back bulging disc, hips, knees, thumbs, shoulder   Bladder cancer ([place]) UROLOGIST-  DR MCKENZIE   S/P TURBT 08-16-2017   BPH (benign prostatic hyperplasia)    CKD (chronic kidney disease),  stage III ([place])    STAGE 3  B CKD per lov 10-16-2020 dr Rica Mast [facility] on chart   Fatigue    MIDDLE OF DAY ON OCCASION   Frequency of urination    GERD (gastroesophageal reflux disease)    on protonix   Hematuria    History of colon polyps    History of dysphagia resolved   HLD (hyperlipidemia)    HOH (hard of hearing)    slightly hoh   HTN (hypertension)    Hypothyroidism    Incontinence of urine    only wears protection if he's planning to be in public for an extended amt of time   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   Right Buccal Cheek   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-- urologist- dr Alyson Ingles   s/p  TURBT 08-16-2017  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   Sleep apnea    uses cpap   UTI (urinary tract infection) 08/20/2020   Weakness of extremity    left knee   Wears glasses     Past Surgical History:  Procedure Laterality Date   BCG X 2     AFTER LAST 2 BLADDER TUMOR REMOVALS   COLONSCOPY  YRS AGO   CYSTOSCOPY N/A 10/29/2020   Procedure: CYSTOSCOPY;  Surgeon: Robley Fries, MD;  Location: [facility];  Service: Urology;  Laterality: N/A;   CYSTOSCOPY W/ URETERAL STENT PLACEMENT Bilateral 08/16/2017   Procedure: CYSTOSCOPY WITH RETROGRADE PYELOGRAM/URETERAL STENT PLACEMENT;  Surgeon: Cleon Gustin, MD;  Location: [facility];  Service: Urology;  Laterality: Bilateral;   CYSTOSCOPY WITH BIOPSY N/A 06/25/2020   Procedure: CYSTOSCOPY FULGURATION   BLADDER  BIOPSY;  Surgeon: Robley Fries, MD;  Location: [facility];  Service: Urology;  Laterality: N/A;   CYSTOSCOPY WITH FULGERATION N/A 12/08/2019   Procedure: CYSTOSCOPY WITH FULGERATION;  Surgeon: Cleon Gustin, MD;  Location: [facility];  Service: Urology;  Laterality: N/A;   KNEE ARTHROSCOPY Left 06/10/2011   PROSTATE BIOPSY  2000   TRANSURETHRAL RESECTION OF  BLADDER TUMOR N/A 08/16/2017   Procedure: TRANSURETHRAL RESECTION OF BLADDER TUMOR (TURBT);  Surgeon: Cleon Gustin, MD;  Location: [facility];  Service: Urology;  Laterality: N/A;   TRANSURETHRAL RESECTION OF BLADDER TUMOR  12-09-2012   dr [facility]   and TURP   TRANSURETHRAL RESECTION OF BLADDER TUMOR N/A 09/20/2017   Procedure: TRANSURETHRAL RESECTION OF BLADDER TUMOR (TURBT);  Surgeon: Cleon Gustin, MD;  Location: [facility];  Service: Urology;  Laterality: N/A;   TRANSURETHRAL RESECTION OF BLADDER TUMOR N/A 12/08/2019   Procedure: TRANSURETHRAL RESECTION OF BLADDER TUMOR;  Surgeon: Cleon Gustin, MD;  Location: [facility];  Service: Urology;  Laterality: N/A;  1 HR   TRANSURETHRAL RESECTION OF BLADDER TUMOR N/A 10/29/2020   Procedure: BLADDER FULGERATION;  Surgeon: Robley Fries, MD;  Location: [facility];  Service: Urology;  Laterality: N/A;  [place]  2011   UPPER GI ENDOSCOPY  YRS AGO    Current Medications: Current Outpatient Medications on File Prior to Visit  Medication Sig   acetaminophen (TYLENOL) 500 MG tablet Take 1,000 mg by mouth every 6 (six) hours as needed.   amLODipine (NORVASC) 10 MG tablet Take 1 tablet (10 mg total) by mouth daily. (Patient taking differently: Take 10 mg by mouth at bedtime.)   ampicillin (PRINCIPEN) 500 MG capsule Take 1 capsule (500 mg total) by mouth 4 (four) times daily. (Patient taking differently: Take 500 mg by mouth as needed (urinary tract infection).)   Ascorbic Acid (VITAMIN C PO) Take 1 tablet by mouth 2 (two) times daily.   aspirin 81 MG tablet Take 81 mg by mouth at bedtime.    finasteride (PROSCAR) 5 MG tablet TAKE ONE TABLET BY MOUTH DAILY (Patient taking differently: Take 5 mg by mouth daily.)   irbesartan (AVAPRO) 150 MG tablet Take 150 mg by mouth at bedtime.   levothyroxine (SYNTHROID, LEVOTHROID) 50 MCG  tablet Take 50 mcg by mouth daily before breakfast.   mirabegron ER (MYRBETRIQ) 25 MG TB24 tablet Take 25 mg by mouth at bedtime.   Niacin (VITAMIN B-3 PO) Take 500 mcg by mouth.   pantoprazole (PROTONIX) 40 MG tablet Take 40 mg by mouth at bedtime.   psyllium (METAMUCIL) 58.6 % powder Take 1 packet by mouth every evening. WITH MEDS   rosuvastatin (CRESTOR) 5 MG tablet Take 5 mg by mouth at bedtime.   silodosin (RAPAFLO) 8 MG CAPS capsule TAKE ONE CAPSULE BY MOUTH EVERY NIGHT AT BEDTIME (Patient taking differently: Take 8 mg by mouth daily.)   zinc gluconate 50 MG tablet Take 500 mg by mouth daily.   No current facility-administered medications on file prior to visit.  Allergies:   Macrobid [nitrofurantoin], Ciprofloxacin, and Cephalexin   Social History   Tobacco Use   Smoking status: Never   Smokeless tobacco: Never  Vaping Use   Vaping Use: Never used  Substance Use Topics   Alcohol use: Not Currently    Alcohol/week: 1.0 - 2.0 standard drink    Types: 1 - 2 Glasses of wine per week   Drug use: No    Family History: family history includes Alzheimer's disease in his mother; Breast cancer in his mother; Emphysema in his father; Hypertension in his mother; Other in his mother. son had "veins cleaned out" 03/2020. Brother with history of heart disease. Mother and mat gma had heart issues as well.  ROS:   Please see the history of present illness.   Additional pertinent ROS otherwise unremarkable.   EKGs/Labs/Other Studies Reviewed:    The following studies were reviewed today:  Lexiscan nuclear stress test 08/22/20 IMPRESSION: 1. No reversible ischemia or infarction.   2. Normal left ventricular wall motion.   3. Left ventricular ejection fraction 61%   4. Non invasive risk stratification*: Low  Echo 05/03/20  1. Left ventricular ejection fraction, by estimation, is 55 to 60%. The  left ventricle has normal function. The left ventricle has no regional  wall motion  abnormalities. There is mild concentric left ventricular  hypertrophy. Left ventricular diastolic  parameters were normal.   2. Right ventricular systolic function is normal. The right ventricular  size is normal.   3. Left atrial size was mildly dilated.   4. The mitral valve is normal in structure. Trivial mitral valve  regurgitation. No evidence of mitral stenosis.   5. The aortic valve is tricuspid. Aortic valve regurgitation is mild.  Mild aortic valve sclerosis is present, with no evidence of aortic valve  stenosis.   6. There is mild dilatation of the ascending aorta measuring 39 mm.   7. The inferior vena cava is normal in size with greater than 50%  respiratory variability, suggesting right atrial pressure of 3 mmHg.   ETT 05/24/20 Blood pressure demonstrated a hypertensive response to exercise. Upsloping ST segment depression ST segment depression was noted during stress in the III, II, aVF, V6, V5 and V4 leads. Negative, adequate stress test. Baseline Vitals  Rest HR 71 bpm    Rest BP 161/83 mmHg    Exercise Time  Exercise duration (min) 6 min    Exercise duration (sec) 0 sec    Peak Stress Vitals  Peak HR 144 bpm    Peak BP 217/89 mmHg    Exercise Data  MPHR 135 bpm    Percent HR 106 %    Estimated workload 7 METS     EKG:  Personally reviewed. 05/01/2021: NST at 67 bpm 08/20/20: sinus tachycardia at 105 bpm  Recent Labs: 08/20/2020: B Natriuretic Peptide 87.5 08/23/2020: Magnesium 2.3 04/11/2021: ALT 30; BUN 50; Creatinine, Ser 2.84; Hemoglobin 11.6; Platelets 284; Potassium 4.2; Sodium 138  Recent Lipid Panel    Component Value Date/Time   CHOL 117 08/20/2020 1908   TRIG 54 08/20/2020 1908   HDL 43 08/20/2020 1908   CHOLHDL 2.7 08/20/2020 1908   VLDL 11 08/20/2020 1908   LDLCALC 63 08/20/2020 1908    Physical Exam:    VS:  BP 128/60   Pulse 67   Ht _0  (1.727 m)   Wt 182 lb 9.6 oz (82.8 kg)   BMI 27.76 kg/m     Wt Readings from Last 3  Encounters:  05/01/21 182 lb 9.6 oz (82.8 kg)  04/11/21 198 lb 6.6 oz (90 kg)  04/09/21 182 lb 3.2 oz (82.6 kg)    GEN: Well nourished, well developed in no acute distress HEENT: Normal, moist mucous membranes NECK: No JVD CARDIAC: regular rhythm, normal S1 and S2, no rubs or gallops. No murmur. VASCULAR: Radial and DP pulses 2+ bilaterally. No carotid bruits RESPIRATORY:  Clear to auscultation without rales, wheezing or rhonchi  ABDOMEN: Soft, non-tender, non-distended MUSCULOSKELETAL:  Ambulates independently SKIN: Warm and dry, no edema NEUROLOGIC:  Alert and oriented x 3. No focal neuro deficits noted.  PSYCHIATRIC:  Normal affect   ASSESSMENT:    1. Chest pain of uncertain etiology   2. Essential hypertension   3. Cardiac risk counseling   4. Hospital discharge follow-up     PLAN:    Chest pain: Reviewed ER workup at length. No evidence of MI. Reviewed prior workup. Reviewed how hsTn assist [place] in determining whether this is MI -after shared decision making, will not pursue additional testing at this time -did discuss red flag warning signs that should have him re-present to ER -also discussed less severe signs for which he should call me and we will discuss next steps  Hyperlipidemia, unspecified: -continue rosuvastatin  Hypertension:  -continue irbesartan  Cardiac risk counseling and prevention recommendations: -recommend heart healthy/Mediterranean diet, with whole grains, fruits, vegetable, fish, lean meats, nuts, and olive oil. Limit salt. -recommend moderate walking, 3-5 times/week for 30-50 minutes each session. Aim for at least 150 minutes.week. Goal should be pace of 3 miles/hours, or walking 1.5 miles in 30 minutes -recommend avoidance of tobacco products. Avoid excess alcohol.  -on aspirin for primary prevention, on hold for procedure. No issues with this. We discussed stopping aspirin given age, indication, but he would like to continue  Plan for follow  up: 1 year or sooner as needed  Buford Dresser, MD, PhD, [facility]    Medication Adjustments/Labs and Tests Ordered: Current medicines are reviewed at length with the patient today.  Concerns regarding medicines are outlined above.  Orders Placed This Encounter  Procedures   EKG 12-Lead    No orders of the defined types were placed in this encounter.   Patient Instructions  Medication Instructions:  Your Physician recommend you continue on your current medication as directed.    *If you need a refill on your cardiac medications before your next appointment, please call your pharmacy*   Lab Work: None ordered today    Testing/Procedures: None ordered today   Follow-Up: At [facility], you and your health needs are our priority.  As part of our continuing mission to provide you with exceptional heart care, we have created designated Provider Care Teams.  These Care Teams include your primary Cardiologist (physician) and Advanced Practice Providers (APPs -  Physician Assistants and Nurse Practitioners) who all work together to provide you with the care you need, when you need it.  We recommend signing up for the patient portal called "MyChart".  Sign up information is provided on this After Visit Summary.  MyChart is used to connect with patients for Virtual Visits (Telemedicine).  Patients are able to view lab/test results, encounter notes, upcoming appointments, etc.  Non-urgent messages can be sent to your provider as well.   To learn more about what you can do with MyChart, go to [email or web address].    Your next appointment:   1 year(s) @ [address]  [place], [place] [address]   The format for your next appointment:   In Person  Provider:   Buford Dresser, MD    Signed, Buford Dresser, MD PhD 05/02/2021    [place]

## 2021-05-01 NOTE — Patient Instructions (Addendum)
Medication Instructions:  Your Physician recommend you continue on your current medication as directed.    *If you need a refill on your cardiac medications before your next appointment, please call your pharmacy*   Lab Work: None ordered today   Testing/Procedures: None ordered today   Follow-Up: At [facility], you and your health needs are our priority.  As part of our continuing mission to provide you with exceptional heart care, we have created designated Provider Care Teams.  These Care Teams include your primary Cardiologist (physician) and Advanced Practice Providers (APPs -  Physician Assistants and Nurse Practitioners) who all work together to provide you with the care you need, when you need it.  We recommend signing up for the patient portal called "MyChart".  Sign up information is provided on this After Visit Summary.  MyChart is used to connect with patients for Virtual Visits (Telemedicine).  Patients are able to view lab/test results, encounter notes, upcoming appointments, etc.  Non-urgent messages can be sent to your provider as well.   To learn more about what you can do with MyChart, go to [email or web address].    Your next appointment:   1 year(s) @ [address] , [place] [address]   The format for your next appointment:   In Person  Provider:   Bridgette Christopher, MD     

## 2021-05-22 DIAGNOSIS — C674 Malignant neoplasm of posterior wall of bladder: Secondary | ICD-10-CM | POA: Diagnosis not present

## 2021-05-22 DIAGNOSIS — E782 Mixed hyperlipidemia: Secondary | ICD-10-CM | POA: Diagnosis not present

## 2021-05-22 DIAGNOSIS — I129 Hypertensive chronic kidney disease with stage 1 through stage 4 chronic kidney disease, or unspecified chronic kidney disease: Secondary | ICD-10-CM | POA: Diagnosis not present

## 2021-05-22 DIAGNOSIS — R3915 Urgency of urination: Secondary | ICD-10-CM | POA: Diagnosis not present

## 2021-05-22 DIAGNOSIS — E039 Hypothyroidism, unspecified: Secondary | ICD-10-CM | POA: Diagnosis not present

## 2021-05-22 DIAGNOSIS — N1832 Chronic kidney disease, stage 3b: Secondary | ICD-10-CM | POA: Diagnosis not present

## 2021-05-30 DIAGNOSIS — H0100B Unspecified blepharitis left eye, upper and lower eyelids: Secondary | ICD-10-CM | POA: Diagnosis not present

## 2021-05-30 DIAGNOSIS — H52203 Unspecified astigmatism, bilateral: Secondary | ICD-10-CM | POA: Diagnosis not present

## 2021-05-30 DIAGNOSIS — H0100A Unspecified blepharitis right eye, upper and lower eyelids: Secondary | ICD-10-CM | POA: Diagnosis not present

## 2021-05-30 DIAGNOSIS — Z961 Presence of intraocular lens: Secondary | ICD-10-CM | POA: Diagnosis not present

## 2021-06-02 DIAGNOSIS — N184 Chronic kidney disease, stage 4 (severe): Secondary | ICD-10-CM | POA: Diagnosis not present

## 2021-06-09 DIAGNOSIS — R310 Gross hematuria: Secondary | ICD-10-CM | POA: Diagnosis not present

## 2021-06-09 DIAGNOSIS — N3000 Acute cystitis without hematuria: Secondary | ICD-10-CM | POA: Diagnosis not present

## 2021-06-09 DIAGNOSIS — C674 Malignant neoplasm of posterior wall of bladder: Secondary | ICD-10-CM | POA: Diagnosis not present

## 2021-06-09 DIAGNOSIS — R311 Benign essential microscopic hematuria: Secondary | ICD-10-CM | POA: Diagnosis not present

## 2021-06-11 DIAGNOSIS — C679 Malignant neoplasm of bladder, unspecified: Secondary | ICD-10-CM | POA: Diagnosis not present

## 2021-06-11 DIAGNOSIS — R319 Hematuria, unspecified: Secondary | ICD-10-CM | POA: Diagnosis not present

## 2021-06-11 DIAGNOSIS — I129 Hypertensive chronic kidney disease with stage 1 through stage 4 chronic kidney disease, or unspecified chronic kidney disease: Secondary | ICD-10-CM | POA: Diagnosis not present

## 2021-06-11 DIAGNOSIS — N184 Chronic kidney disease, stage 4 (severe): Secondary | ICD-10-CM | POA: Diagnosis not present

## 2021-06-11 DIAGNOSIS — E875 Hyperkalemia: Secondary | ICD-10-CM | POA: Diagnosis not present

## 2021-06-11 DIAGNOSIS — N2581 Secondary hyperparathyroidism of renal origin: Secondary | ICD-10-CM | POA: Diagnosis not present

## 2021-06-11 DIAGNOSIS — R809 Proteinuria, unspecified: Secondary | ICD-10-CM | POA: Diagnosis not present

## 2021-06-19 ENCOUNTER — Ambulatory Visit (INDEPENDENT_AMBULATORY_CARE_PROVIDER_SITE_OTHER): Payer: Medicare Other | Admitting: Dermatology

## 2021-06-19 ENCOUNTER — Encounter: Payer: Self-pay | Admitting: Dermatology

## 2021-06-19 ENCOUNTER — Other Ambulatory Visit: Payer: Self-pay

## 2021-06-19 DIAGNOSIS — C44319 Basal cell carcinoma of skin of other parts of face: Secondary | ICD-10-CM | POA: Diagnosis not present

## 2021-06-19 DIAGNOSIS — C4491 Basal cell carcinoma of skin, unspecified: Secondary | ICD-10-CM

## 2021-06-19 NOTE — Patient Instructions (Signed)

## 2021-06-21 IMAGING — DX DG CHEST 2V
2 series · 2 of 2 positions shown · non-contrast
Comparison: August 20, 2020

CLINICAL DATA: Chest pain and shortness of breath

EXAM:
CHEST - 2 VIEW

[chest pa]
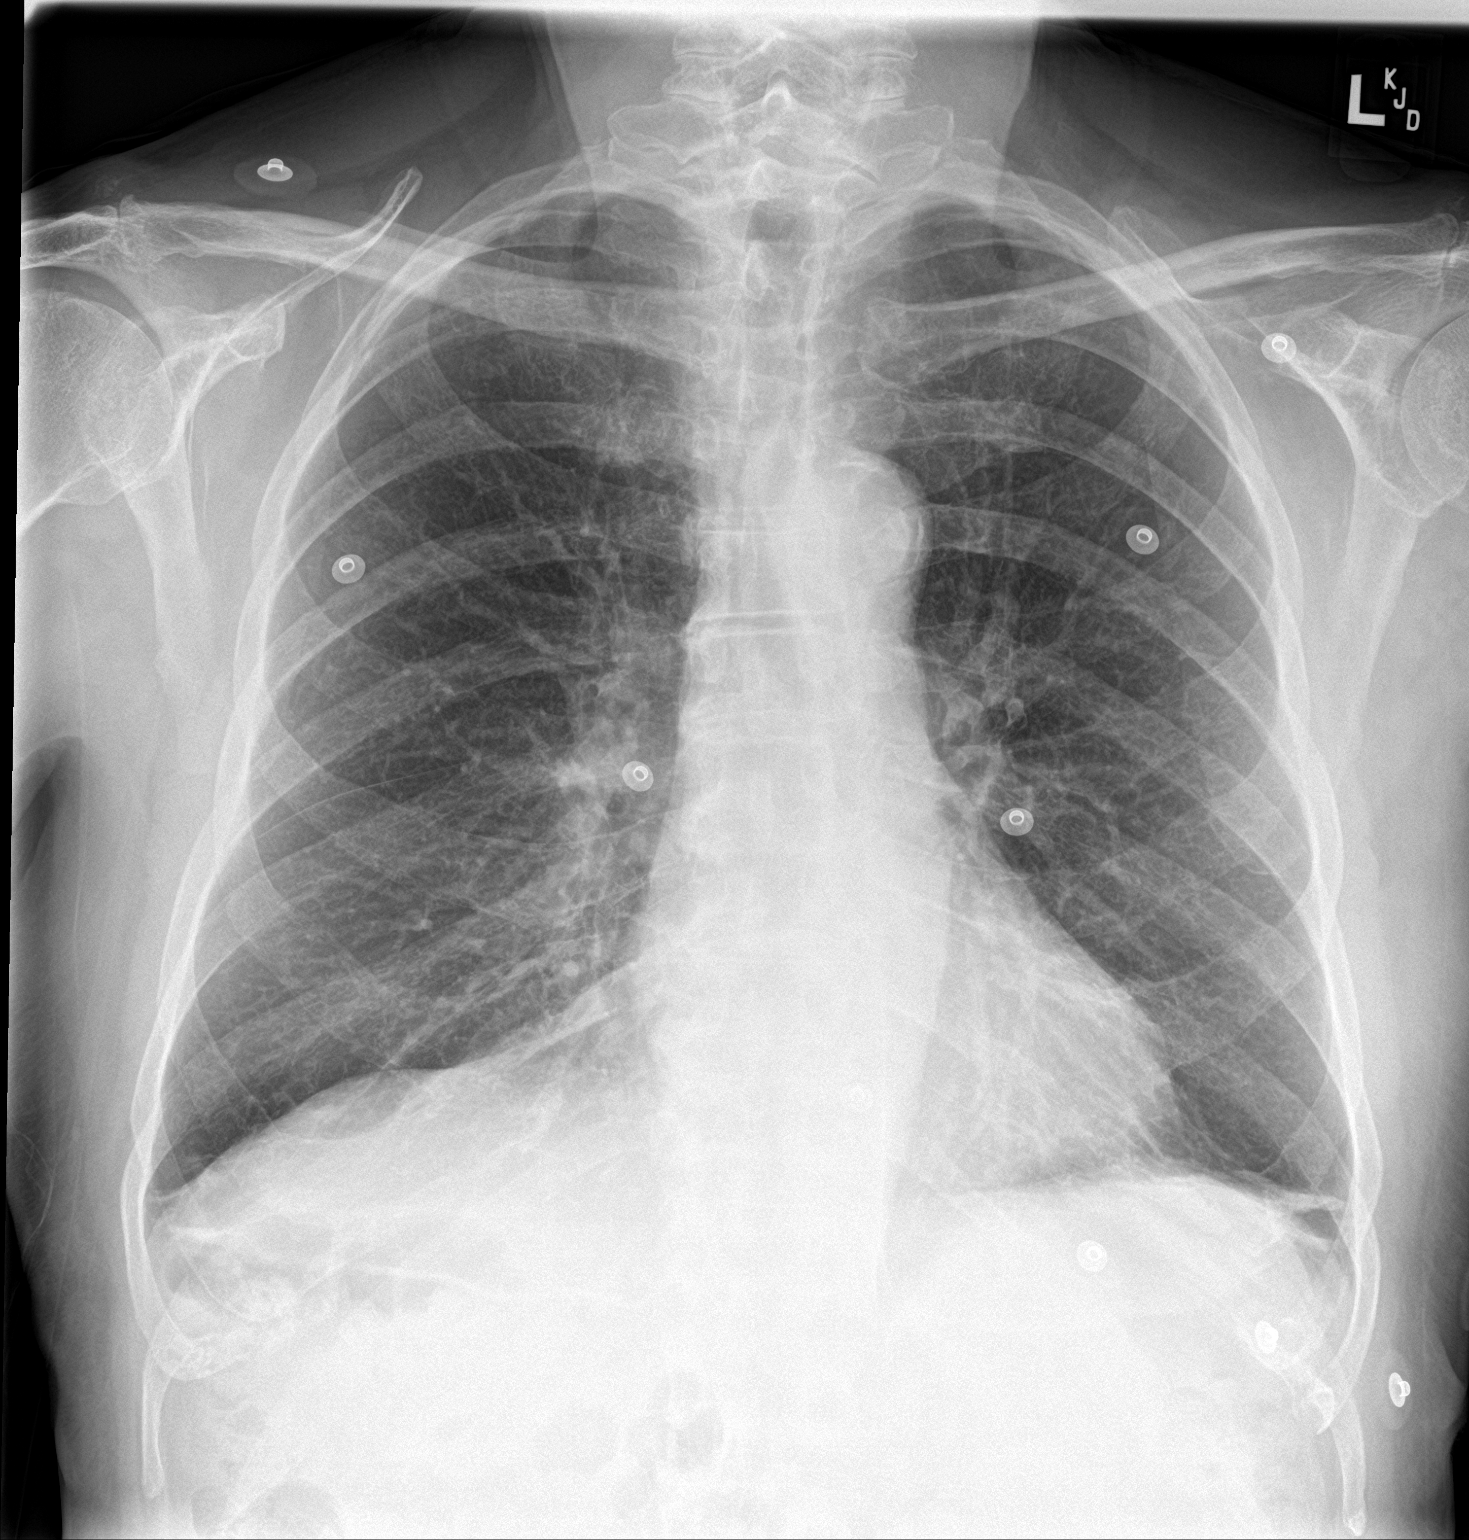

[chest lat]
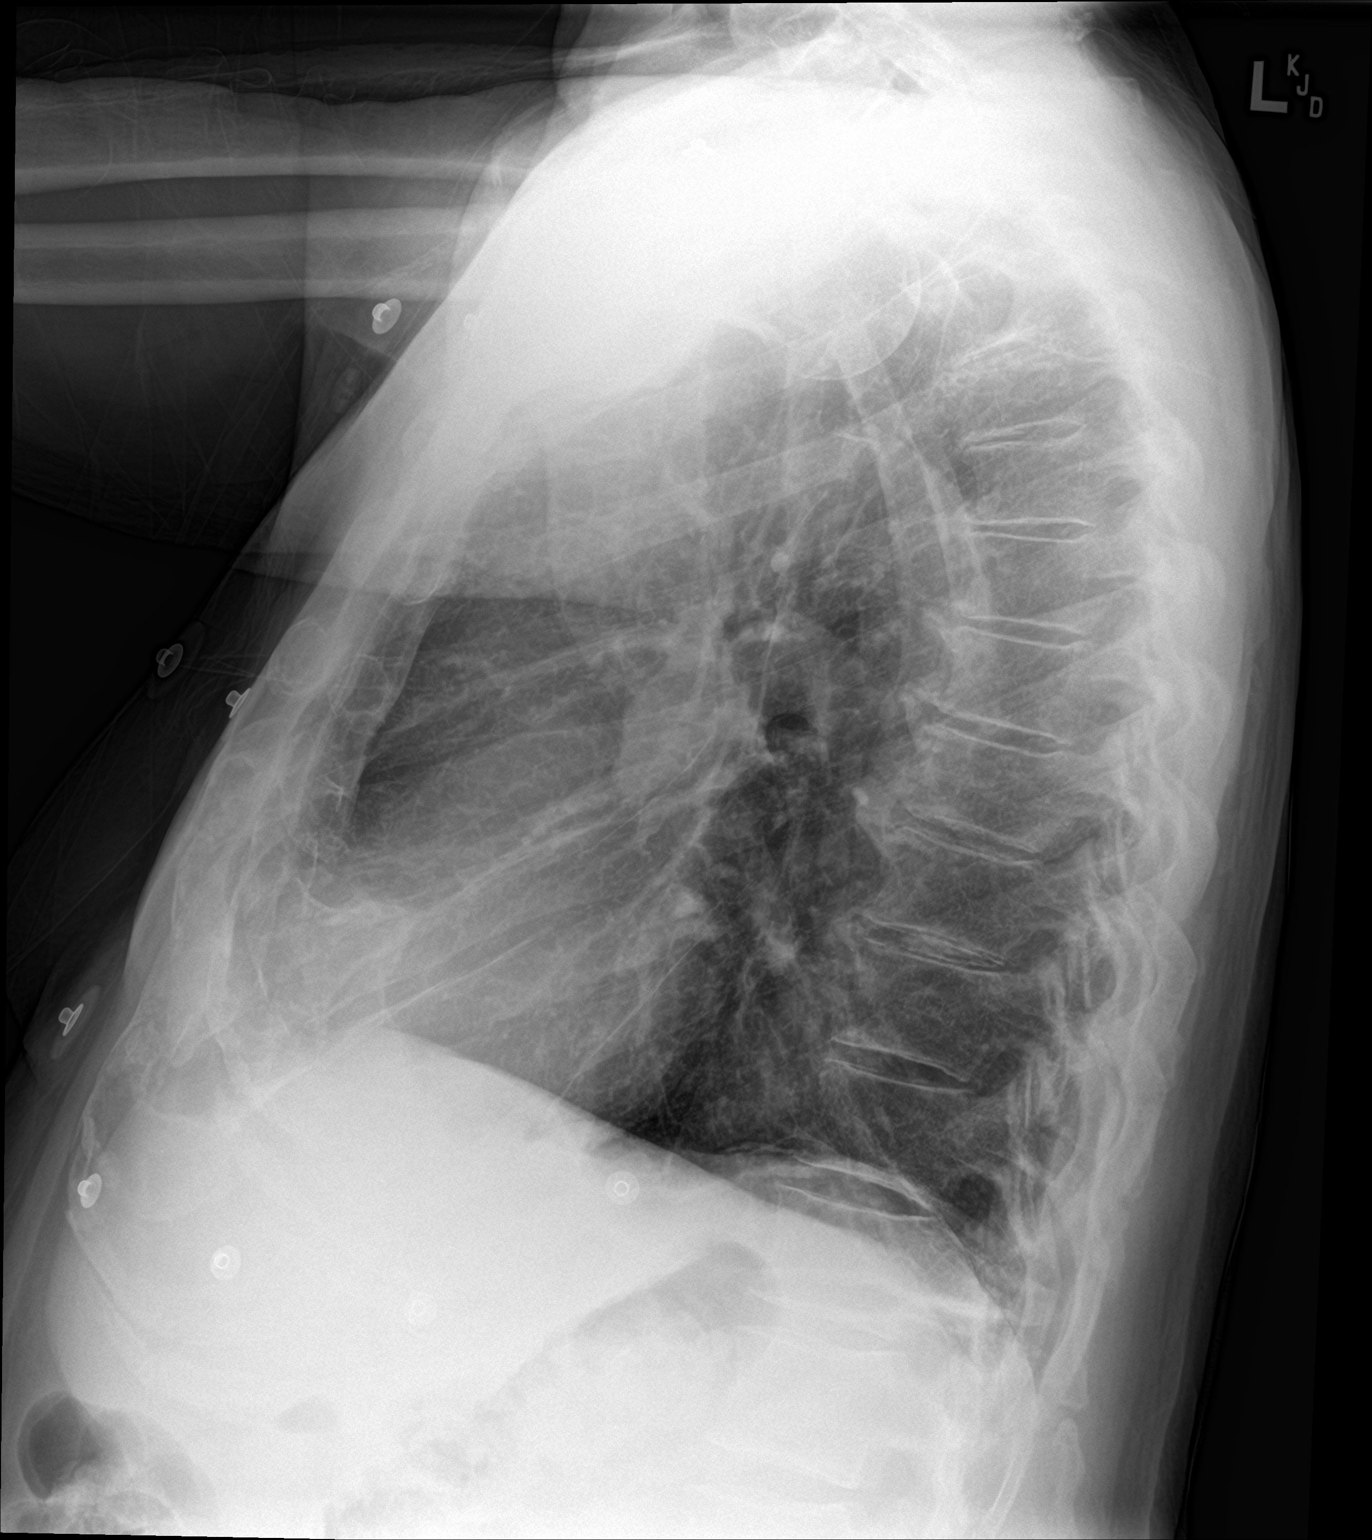

[2 of 2 positions shown; findings below may reference images not displayed]

FINDINGS: The heart size and mediastinal contours are within normal limits.
Aortic atherosclerosis. Bibasilar linear atelectasis versus
scarring. No lobar consolidation. No visible pleural effusion or
pneumothorax. Spondylosis.
IMPRESSION: Bibasilar linear atelectasis versus scarring.

## 2021-06-22 DIAGNOSIS — E039 Hypothyroidism, unspecified: Secondary | ICD-10-CM | POA: Diagnosis not present

## 2021-06-22 DIAGNOSIS — N1832 Chronic kidney disease, stage 3b: Secondary | ICD-10-CM | POA: Diagnosis not present

## 2021-06-22 DIAGNOSIS — E782 Mixed hyperlipidemia: Secondary | ICD-10-CM | POA: Diagnosis not present

## 2021-06-22 DIAGNOSIS — I129 Hypertensive chronic kidney disease with stage 1 through stage 4 chronic kidney disease, or unspecified chronic kidney disease: Secondary | ICD-10-CM | POA: Diagnosis not present

## 2021-06-23 DIAGNOSIS — C678 Malignant neoplasm of overlapping sites of bladder: Secondary | ICD-10-CM | POA: Diagnosis not present

## 2021-06-23 DIAGNOSIS — R310 Gross hematuria: Secondary | ICD-10-CM | POA: Diagnosis not present

## 2021-06-25 ENCOUNTER — Other Ambulatory Visit: Payer: Self-pay

## 2021-06-25 ENCOUNTER — Other Ambulatory Visit: Payer: Self-pay | Admitting: Urology

## 2021-06-25 ENCOUNTER — Ambulatory Visit (INDEPENDENT_AMBULATORY_CARE_PROVIDER_SITE_OTHER): Payer: Medicare Other

## 2021-06-25 DIAGNOSIS — Z4802 Encounter for removal of sutures: Secondary | ICD-10-CM

## 2021-06-25 NOTE — Progress Notes (Signed)
NTS Suture removal, No s/s of infection, patient's pathology report isn't back yet.

## 2021-06-26 ENCOUNTER — Other Ambulatory Visit: Payer: Self-pay | Admitting: Urology

## 2021-06-30 DIAGNOSIS — I10 Essential (primary) hypertension: Secondary | ICD-10-CM | POA: Diagnosis not present

## 2021-06-30 DIAGNOSIS — Z01818 Encounter for other preprocedural examination: Secondary | ICD-10-CM | POA: Diagnosis not present

## 2021-07-03 DIAGNOSIS — I10 Essential (primary) hypertension: Secondary | ICD-10-CM | POA: Diagnosis not present

## 2021-07-03 DIAGNOSIS — R7309 Other abnormal glucose: Secondary | ICD-10-CM | POA: Diagnosis not present

## 2021-07-03 DIAGNOSIS — E039 Hypothyroidism, unspecified: Secondary | ICD-10-CM | POA: Diagnosis not present

## 2021-07-03 DIAGNOSIS — Z01818 Encounter for other preprocedural examination: Secondary | ICD-10-CM | POA: Diagnosis not present

## 2021-07-03 DIAGNOSIS — N184 Chronic kidney disease, stage 4 (severe): Secondary | ICD-10-CM | POA: Diagnosis not present

## 2021-07-03 DIAGNOSIS — I700 Atherosclerosis of aorta: Secondary | ICD-10-CM | POA: Diagnosis not present

## 2021-07-03 DIAGNOSIS — E782 Mixed hyperlipidemia: Secondary | ICD-10-CM | POA: Diagnosis not present

## 2021-07-04 ENCOUNTER — Encounter (HOSPITAL_BASED_OUTPATIENT_CLINIC_OR_DEPARTMENT_OTHER): Payer: Self-pay | Admitting: Urology

## 2021-07-04 ENCOUNTER — Other Ambulatory Visit: Payer: Self-pay

## 2021-07-04 DIAGNOSIS — M6530 Trigger finger, unspecified finger: Secondary | ICD-10-CM

## 2021-07-04 HISTORY — DX: Trigger finger, unspecified finger: M65.30

## 2021-07-04 NOTE — Progress Notes (Addendum)
Spoke w/ via phone for pre-op interview---pt Lab needs dos----   I stat            Lab results------see below COVID test -----patient states asymptomatic no test needed Arrive at -------1245 pm 07-08-1021 NPO after MN NO Solid Food.  Clear liquids from MN until---1145 am then npo Med rec completed Medications to take morning of surgery -----levothyroxine Diabetic medication -----n/a Patient instructed no nail polish to be worn day of surgery Patient instructed to bring photo id and insurance card day of surgery Patient aware to have Driver (ride ) / caregiver  pt aware and will arrange driver & caregiver   for 24 hours after surgery  Patient Special Instructions -----pt wishes to not bring cpap dos Pre-Op special Istructions -----none Patient verbalized understanding of instructions that were given at this phone interview. Patient denies shortness of breath, chest pain, fever, cough at this phone interview.   Anesthesia Review:had cardiac clearance for 05-24-2020 surgery and pt met [facility] guidelines. Has had multiple surgeries without problems at [place], pt denies cardiac s & s  or sob  at pre op call   [place] [facility] dr Ozella Rocks Carolin Sicks 06-11-2021 on chart PCP: dr Felicie Morn  Cardiologist : lov dr c bridgette 05-01-2021 epic f/u in 1 year Chest x-ray :04-11-2021 epic EKG :05-01-2021 epic Echo :05-03-2020 epic Ett: 05-24-2020 epic Stress test:08-22-2020 epic Cardiac Cath : none Activity level: walks 2 miles several times a week without problems, can climb flight of stairs without difficulty or sob Sleep Study/ CPAP :uses cpap ASA / Instructions/ Last Dose :  last dose 07-01-2021 pt stopped 81 mg asa per dr pace instructions  Pt requests iv start on right arm if possible dos

## 2021-07-08 ENCOUNTER — Ambulatory Visit (HOSPITAL_BASED_OUTPATIENT_CLINIC_OR_DEPARTMENT_OTHER)
Admission: RE | Admit: 2021-07-08 | Discharge: 2021-07-08 | Disposition: A | Discharge status: Home / Self Care | Payer: Medicare Other | Attending: Urology | Admitting: Urology

## 2021-07-08 ENCOUNTER — Ambulatory Visit (HOSPITAL_BASED_OUTPATIENT_CLINIC_OR_DEPARTMENT_OTHER): Payer: Medicare Other | Admitting: Anesthesiology

## 2021-07-08 ENCOUNTER — Encounter (HOSPITAL_BASED_OUTPATIENT_CLINIC_OR_DEPARTMENT_OTHER)
Admission: RE | Disposition: A | Discharge status: Home / Self Care | Payer: Self-pay | Source: Home / Self Care | Attending: Urology

## 2021-07-08 ENCOUNTER — Encounter (HOSPITAL_BASED_OUTPATIENT_CLINIC_OR_DEPARTMENT_OTHER): Payer: Self-pay | Admitting: Urology

## 2021-07-08 DIAGNOSIS — R310 Gross hematuria: Secondary | ICD-10-CM | POA: Insufficient documentation

## 2021-07-08 DIAGNOSIS — Z881 Allergy status to other antibiotic agents status: Secondary | ICD-10-CM | POA: Insufficient documentation

## 2021-07-08 DIAGNOSIS — Z79899 Other long term (current) drug therapy: Secondary | ICD-10-CM | POA: Insufficient documentation

## 2021-07-08 DIAGNOSIS — Z79890 Hormone replacement therapy (postmenopausal): Secondary | ICD-10-CM | POA: Diagnosis not present

## 2021-07-08 DIAGNOSIS — N329 Bladder disorder, unspecified: Secondary | ICD-10-CM | POA: Diagnosis not present

## 2021-07-08 DIAGNOSIS — Z8551 Personal history of malignant neoplasm of bladder: Secondary | ICD-10-CM | POA: Diagnosis not present

## 2021-07-08 DIAGNOSIS — Z7982 Long term (current) use of aspirin: Secondary | ICD-10-CM | POA: Insufficient documentation

## 2021-07-08 DIAGNOSIS — N1832 Chronic kidney disease, stage 3b: Secondary | ICD-10-CM | POA: Diagnosis not present

## 2021-07-08 DIAGNOSIS — N3289 Other specified disorders of bladder: Secondary | ICD-10-CM | POA: Diagnosis not present

## 2021-07-08 DIAGNOSIS — I129 Hypertensive chronic kidney disease with stage 1 through stage 4 chronic kidney disease, or unspecified chronic kidney disease: Secondary | ICD-10-CM | POA: Diagnosis not present

## 2021-07-08 DIAGNOSIS — C679 Malignant neoplasm of bladder, unspecified: Secondary | ICD-10-CM | POA: Diagnosis present

## 2021-07-08 HISTORY — PX: TRANSURETHRAL RESECTION OF BLADDER TUMOR: SHX2575

## 2021-07-08 HISTORY — PX: CYSTOSCOPY: SHX5120

## 2021-07-08 LAB — POCT I-STAT, CHEM 8
BUN: 33 mg/dL — ABNORMAL HIGH (ref 8–23)
Calcium, Ion: 1.26 mmol/L (ref 1.15–1.40)
Chloride: 108 mmol/L (ref 98–111)
Creatinine, Ser: 2.9 mg/dL — ABNORMAL HIGH (ref 0.61–1.24)
Glucose, Bld: 98 mg/dL (ref 70–99)
HCT: 36 % — ABNORMAL LOW (ref 39.0–52.0)
Hemoglobin: 12.2 g/dL — ABNORMAL LOW (ref 13.0–17.0)
Potassium: 4.5 mmol/L (ref 3.5–5.1)
Sodium: 142 mmol/L (ref 135–145)
TCO2: 22 mmol/L (ref 22–32)

## 2021-07-08 SURGERY — TURBT (TRANSURETHRAL RESECTION OF BLADDER TUMOR)
Anesthesia: General | Site: Bladder

## 2021-07-08 MED ORDER — SODIUM CHLORIDE 0.9 % IR SOLN
Status: DC | PRN
  Administered 2021-07-08: 3000 mL via INTRAVESICAL

## 2021-07-08 MED ORDER — SODIUM CHLORIDE 0.9 % IV SOLN: INTRAVENOUS | Status: DC

## 2021-07-08 MED ORDER — FENTANYL CITRATE (PF) 100 MCG/2ML IJ SOLN
INTRAMUSCULAR | Status: DC | PRN
  Administered 2021-07-08 (×2): 50 ug via INTRAVENOUS

## 2021-07-08 MED ORDER — LIDOCAINE 2% (20 MG/ML) 5 ML SYRINGE
INTRAMUSCULAR | Status: DC | PRN
  Administered 2021-07-08: 60 mg via INTRAVENOUS

## 2021-07-08 MED ORDER — ONDANSETRON HCL 4 MG/2ML IJ SOLN
INTRAMUSCULAR | Status: DC | PRN
  Administered 2021-07-08: 4 mg via INTRAVENOUS

## 2021-07-08 MED ORDER — ONDANSETRON HCL 4 MG/2ML IJ SOLN
INTRAMUSCULAR | Status: AC
  Filled 2021-07-08: qty 2

## 2021-07-08 MED ORDER — PROPOFOL 10 MG/ML IV BOLUS
INTRAVENOUS | Status: AC
  Filled 2021-07-08: qty 20

## 2021-07-08 MED ORDER — FENTANYL CITRATE (PF) 100 MCG/2ML IJ SOLN: 25.0000 ug | INTRAMUSCULAR | Status: DC | PRN

## 2021-07-08 MED ORDER — CLINDAMYCIN PHOSPHATE 600 MG/50ML IV SOLN
INTRAVENOUS | Status: AC
  Filled 2021-07-08: qty 50

## 2021-07-08 MED ORDER — LIDOCAINE HCL (PF) 2 % IJ SOLN
INTRAMUSCULAR | Status: AC
  Filled 2021-07-08: qty 5

## 2021-07-08 MED ORDER — DEXAMETHASONE SODIUM PHOSPHATE 10 MG/ML IJ SOLN
INTRAMUSCULAR | Status: AC
  Filled 2021-07-08: qty 1

## 2021-07-08 MED ORDER — CLINDAMYCIN PHOSPHATE 600 MG/50ML IV SOLN
600.0000 mg | Freq: Once | INTRAVENOUS | Status: AC
  Administered 2021-07-08: 600 mg via INTRAVENOUS

## 2021-07-08 MED ORDER — EPHEDRINE 5 MG/ML INJ
INTRAVENOUS | Status: AC
  Filled 2021-07-08: qty 5

## 2021-07-08 MED ORDER — DEXAMETHASONE SODIUM PHOSPHATE 10 MG/ML IJ SOLN
INTRAMUSCULAR | Status: DC | PRN
  Administered 2021-07-08: 10 mg via INTRAVENOUS

## 2021-07-08 MED ORDER — PROPOFOL 10 MG/ML IV BOLUS
INTRAVENOUS | Status: DC | PRN
  Administered 2021-07-08: 160 mg via INTRAVENOUS

## 2021-07-08 MED ORDER — CIPROFLOXACIN HCL 250 MG PO TABS: 250.0000 mg | ORAL_TABLET | Freq: Every day | ORAL | 0 refills | Status: AC

## 2021-07-08 MED ORDER — FENTANYL CITRATE (PF) 100 MCG/2ML IJ SOLN
INTRAMUSCULAR | Status: AC
  Filled 2021-07-08: qty 2

## 2021-07-08 MED ORDER — EPHEDRINE SULFATE-NACL 50-0.9 MG/10ML-% IV SOSY
PREFILLED_SYRINGE | INTRAVENOUS | Status: DC | PRN
  Administered 2021-07-08: 10 mg via INTRAVENOUS

## 2021-07-08 SURGICAL SUPPLY — 22 items
BAG DRAIN URO-CYSTO SKYTR STRL (DRAIN) ×2 IMPLANT
BAG DRN RND TRDRP ANRFLXCHMBR (UROLOGICAL SUPPLIES)
BAG DRN UROCATH (DRAIN) ×1
BAG URINE DRAIN 2000ML AR STRL (UROLOGICAL SUPPLIES) IMPLANT
CATH FOLEY 2WAY SLVR  5CC 18FR (CATHETERS)
CATH FOLEY 2WAY SLVR 5CC 18FR (CATHETERS) IMPLANT
CLOTH BEACON ORANGE TIMEOUT ST (SAFETY) ×2 IMPLANT
ELECT REM PT RETURN 9FT ADLT (ELECTROSURGICAL) ×2
ELECTRODE REM PT RTRN 9FT ADLT (ELECTROSURGICAL) ×1 IMPLANT
GLOVE SURG ENC MOIS LTX SZ6.5 (GLOVE) ×2 IMPLANT
GOWN STRL REUS W/TWL LRG LVL3 (GOWN DISPOSABLE) ×2 IMPLANT
HOLDER FOLEY CATH W/STRAP (MISCELLANEOUS) IMPLANT
IV NS IRRIG 3000ML ARTHROMATIC (IV SOLUTION) ×2 IMPLANT
KIT TURNOVER CYSTO (KITS) ×2 IMPLANT
LOOP CUT BIPOLAR 24F LRG (ELECTROSURGICAL) ×1 IMPLANT
MANIFOLD NEPTUNE II (INSTRUMENTS) ×2 IMPLANT
NS IRRIG 500ML POUR BTL (IV SOLUTION) ×1 IMPLANT
PACK CYSTO (CUSTOM PROCEDURE TRAY) ×2 IMPLANT
SYR TOOMEY IRRIG 70ML (MISCELLANEOUS) ×2
SYRINGE TOOMEY IRRIG 70ML (MISCELLANEOUS) ×1 IMPLANT
TUBE CONNECTING 12X1/4 (SUCTIONS) ×2 IMPLANT
TUBING UROLOGY SET (TUBING) ×2 IMPLANT

## 2021-07-08 NOTE — Discharge Instructions (Addendum)
Post Bladder Surgery Instructions   General instructions:     Your recent bladder surgery requires very little post hospital care but some definite precautions.  Despite the fact that no skin incisions were used, the area around the bladder incisions are raw and covered with scabs to promote healing and prevent bleeding. Certain precautions are needed to insure that the scabs are not disturbed over the next 2-4 weeks while the healing proceeds.  Because the raw surface inside your bladder and the irritating effects of urine you may expect frequency of urination and/or urgency (a stronger desire to urinate) and perhaps even getting up at night more often. This will usually resolve or improve slowly over the healing period. You may see some blood in your urine over the first 6 weeks. Do not be alarmed, even if the urine was clear for a while. Get off your feet and drink lots of fluids until clearing occurs. If you start to pass clots or don't improve call us.  Catheter: (If you are discharged with a catheter.)  1. Keep your catheter secured to your leg at all times with tape or the supplied strap. 2. You may experience leakage of urine around your catheter- as long as the  catheter continues to drain, this is normal.  If your catheter stops draining  go to the ER. 3. You may also have blood in your urine, even after it has been clear for  several days; you may even pass some small blood clots or other material.  This  is normal as well.  If this happens, sit down and drink plenty of water to help  make urine to flush out your bladder.  If the blood in your urine becomes worse  after doing this, contact our office or return to the ER. 4. You may use the leg bag (small bag) during the day, but use the large bag at  night.  Diet:  You may return to your normal diet immediately. Because of the raw surface of your bladder, alcohol, spicy foods, foods high in acid and drinks with caffeine may  cause irritation or frequency and should be used in moderation. To keep your urine flowing freely and avoid constipation, drink plenty of fluids during the day (8-10 glasses). Tip: Avoid cranberry juice because it is very acidic.  Activity:  Your physical activity doesn't need to be restricted. However, if you are very active, you may see some blood in the urine. We suggest that you reduce your activity under the circumstances until the bleeding has stopped.  Bowels:  It is important to keep your bowels regular during the postoperative period. Straining with bowel movements can cause bleeding. A bowel movement every other day is reasonable. Use a mild laxative if needed, such as milk of magnesia 2-3 tablespoons, or 2 Dulcolax tablets. Call if you continue to have problems. If you had been taking narcotics for pain, before, during or after your surgery, you may be constipated. Take a laxative if necessary.    Medication:  You should resume your pre-surgery medications unless told not to. In addition you may be given an antibiotic to prevent or treat infection. Antibiotics are not always necessary. All medication should be taken as prescribed until the bottles are finished unless you are having an unusual reaction to one of the drugs.  Antibiotics: patient has allergies to many antibiotics.  I prescribed 3 days of low dose Cipro 250mg .  If he wishes for me to change it  to something else please call office tomorrow.  This is for UTI prevention as he has gotten many UTIs following this procedure.    Post Anesthesia Home Care Instructions  Activity: Get plenty of rest for the remainder of the day. A responsible individual must stay with you for 24 hours following the procedure.  For the next 24 hours, DO NOT: -Drive a car -Paediatric nurse -Drink alcoholic beverages -Take any medication unless instructed by your physician -Make any legal decisions or sign important papers.  Meals: Start  with liquid foods such as gelatin or soup. Progress to regular foods as tolerated. Avoid greasy, spicy, heavy foods. If nausea and/or vomiting occur, drink only clear liquids until the nausea and/or vomiting subsides. Call your physician if vomiting continues.  Special Instructions/Symptoms: Your throat may feel dry or sore from the anesthesia or the breathing tube placed in your throat during surgery. If this causes discomfort, gargle with warm salt water. The discomfort should disappear within 24 hours.

## 2021-07-08 NOTE — Anesthesia Procedure Notes (Signed)
Procedure Name: LMA Insertion Date/Time: 07/08/2021 3:39 PM Performed by: Williford, Peggy D, CRNA Pre-anesthesia Checklist: Patient identified, Emergency Drugs available, Suction available, Patient being monitored and Timeout performed Patient Re-evaluated:Patient Re-evaluated prior to induction Oxygen Delivery Method: Circle system utilized Preoxygenation: Pre-oxygenation with 100% oxygen Induction Type: IV induction Ventilation: Mask ventilation without difficulty LMA: LMA inserted LMA Size: 4.0 Number of attempts: 1 Placement Confirmation: positive ETCO2, CO2 detector and breath sounds checked- equal and bilateral Tube secured with: Tape Dental Injury: Teeth and Oropharynx as per pre-operative assessment

## 2021-07-08 NOTE — Transfer of Care (Signed)
Immediate Anesthesia Transfer of Care Note  Patient: [name] [name]  Procedure(s) Performed: TRANSURETHRAL RESECTION OF BLADDER TUMOR (TURBT)/FULGERATION (Bladder) CYSTOSCOPY (Bladder)  Patient Location: PACU  Anesthesia Type:General  Level of Consciousness: awake, alert , oriented and patient cooperative  Airway & Oxygen Therapy: Patient Spontanous Breathing and Patient connected to nasal cannula oxygen  Post-op Assessment: Report given to RN and Post -op Vital signs reviewed and stable  Post vital signs: Reviewed and stable  Last Vitals:  Vitals Value Taken Time  BP    Temp    Pulse 87 07/08/21 1607  Resp 17 07/08/21 1607  SpO2 97 % 07/08/21 1607  Vitals shown include unvalidated device data.  Last Pain:  Vitals:   07/08/21 1313  TempSrc: Oral  PainSc: 0-No pain      Patients Stated Pain Goal: 5 (17/71/16 5790)  Complications: No notable events documented.

## 2021-07-08 NOTE — Op Note (Signed)
Operative Note  Preoperative diagnosis:  1.  Gross hematuria 2.  History of bladder cancer   Postoperative diagnosis: 1.  Same  Procedure(s): 1.  Cystoscopy with fulguration of bladder lesion greater than 2 cm  Surgeon: Jacalyn Lefevre, MD  Assistants:  None  Anesthesia:  General  Complications:  None  EBL: Minimal  Specimens: 1.  None  Drains/Catheters: 1.  None  Intraoperative findings:   Normal anterior urethra Evidence of prior TUR with mild prostatic regrowth right lateral lobe Previously resected bilateral ureteral orifices Multiple well-healed TUR scars with oozing seen from posterior superior bladder wall no; discrete bladder cancer recurrence/turmor seen  Indication:  [name] [name] is a 87 y.o. male with history of high-grade TA bladder cancer diagnosed in September 2018 with most recent fulguration December 2021.  He came in for surveillance cystoscopy at the end of June which was normal.  He came in again in August due to gross hematuria.  Description of procedure: After risks and benefits of procedure discussed with the patient informed consent was obtained.  The patient is taken to the operating room placed in supine position.  Anesthesia was induced antibiotics administered.  The patient was then repositioned in the dorsolithotomy position.  He was prepped and draped in usual sterile fashion.  Timeout was performed.  A 24 French rigid resectoscope was placed in the urethral meatus using the visual obturator and advanced into the bladder.  Findings are noted above.  A bipolar loop was then used to cauterize the oozing posterior bladder mucosa.  This took place until all abnormal mucosa was fulgurated.  Hemostasis deemed adequate with irrigant turned off.  The patient's bladder was decompressed.  The resectoscope was removed.  The patient emerged from anesthesia and was transferred the PACU in stable condition.   Plan:  Discharge home today

## 2021-07-08 NOTE — H&P (Signed)
CC/HPI: cc: bladder cancer   11/11/20: 88 year old man with a history of high-grade Ta bladder cancer diagnosed in September 2018 with most recent recurrence last month. Repeat TURBT on 06/25/2020 showed low-grade Ta. He underwent cystoscopy with fulguration last week. He also has urinary frequency and urgency that has responded well to Myrbetriq. He has completed a 2nd induction BCG. He was doing well initially after the surgery however has had worsening urinary frequency, urgency and painful urination. Cysto with fulguration 10/29/20.   02/17/21: Surveillance cysto after maintenance BCG, no hematuria, starting to feel better after BCG   02/27/2021: 87 year old man with a history of high-grade Ta bladder cancer who underwent surveillance cystoscopy on 02/17/2021 comes in today with 2 days of gross hematuria with blood clots. His urine has since cleared up in he is feeling much better. He denies any dysuria or pain. His urinalysis today does look concerning for infection. Of note his last 3 urine cultures have been negative with similar appearing urinalysis. No fever chills.   05/22/21: Here for surveillance cystoscopy. He has not seen any hematuria recently. He has completed a 2nd course of induction BCG and maintanence. Last surveillance cystoscopy March 2020. He continues to use Myrbetriq 50 mg which helps his urgency. He still has nocturia 3-4 times a night and daytime frequency.   06/09/21: 87 year old male who presents today with concerns of pink tinged urine associated with slight dysuria and froth in his urine. He has had some progressive urinary frequency and urgency as well. This began 3 days ago. He denies fevers or chills. No complaints of unilateral flank pain or discomfort. Currently has ampicillin to use if needed. He has finished his BCG instillations. He underwent a recent cystoscopy with negative findings.   06/23/21: 87 year old male with a history of high-grade TA bladder cancer. He had a  cystoscopy at the end of June with negative findings. He presented in July with concerns of pink tinged urine. His urine culture at that time was negative. Today, he continues to have gross hematuria. He denies any pain, fevers, chills. He endorses some worsening frequency and urgency.     ALLERGIES: Cipro TABS - Diarrhea, **Diarrhea if taking 750mg , 500mg  ok to take** Macrobid - Respiratory Distress    MEDICATIONS: Finasteride 5 mg tablet 1 tablet PO Daily  Levothyroxine Sodium 50 mcg tablet  Myrbetriq 50 mg tablet, extended release 24 hr  Phenazopyridine Hcl 200 mg tablet  Amlodipine Besylate 10 mg tablet  Aspirin Ec 81 mg tablet, delayed release  Irbesartan 300 mg tablet  Metamucil  Pantoprazole Sodium 40 mg tablet, delayed release  Rosuvastatin Calcium 5 mg tablet  Silodosin 8 mg capsule 1 capsule PO Q HS  Vitamin D3  Zinc 30 mg tablet     GU PSH: Bladder Instill AntiCA Agent - 02/04/2021, 01/28/2021, 01/21/2021, 09/05/2020, 08/15/2020, 03/06/2020, 02/28/2020, 02/21/2020, 02/14/2020, 02/07/2020, 01/31/2020, 2019, 2018, 2018, 2018, 2018, 2018 Cysto Bladder Ureth Biopsy - 06/25/2020 Cystoscopy - 06/23/2021, 05/22/2021, 02/17/2021, 10/14/2020, 05/16/2020, 11/09/2019, 08/11/2019, 2020, 2020, 2019, 2019, 2019, 2019, 2018 Cystoscopy Insert Stent, Right - 2018 Cystoscopy TURBT >5 cm - 2021, 2018 Cystoscopy TURBT 2-5 cm - 10/29/2020, 2018 Cystoscopy TURP Locm 300-399Mg /Ml Iodine,1Ml - 2018 Remove Prostate Regrowth - 2021     NON-GU PSH: Cataract surgery - 2019 Knee Arthroscopy, Left     GU PMH: Bladder Cancer overlapping sites - 06/23/2021, - 02/27/2021, - 02/17/2021, - 02/04/2021, - 01/28/2021, - 01/21/2021, - 01/14/2021, - 12/27/2020, - 11/11/2020, - 09/11/2020, - 09/05/2020, - 08/15/2020, - 08/08/2020, -  08/11/2019, - 2020, - 2020, - 2019, - 2019, - 2019, - 2019, - 2018, - 2018 Gross hematuria (Stable) - 06/23/2021, - 06/09/2021 (Stable), - 02/27/2021, Improved, - 01/26/2020, - 01/19/2020, - 2021, - 11/09/2019 (Improving,  Chronic), Reassured at this time if urine is only light pink/clear cherry he does not need admitting for CBI. He will continue with ABX as Rx'd and proceed with cysto on 09/20/17 as scheduled. Instructed pt and family that if he develops gross hematuria with clots will need to go immediately to [facility] ER for evaluation. Culture urine. Will continue with current ABX. , - 2018, - 2018, - 2018, - 2018 Acute Cystitis/UTI - 06/09/2021, - 02/27/2021, - 12/27/2020, - 11/11/2020, - 07/05/2020 Bladder Cancer Posterior - 06/09/2021, - 05/22/2021, Patient with possible bladder tumor recurrence on cystoscopy today. He has active oozing from posterior bladder wall in between 2 prior TUR sites with irregular bladder mucosa. Given his recent recurrence we discussed proceeding with cystoscopy, bladder biopsy and fulguration of abnormal tissue. Will also discussed with patient intravesical gemcitabine. Risks and benefits of the procedure discussed with the patient including but not limited to pain, bleeding, possible need for Foley catheter, injury to surrounding structures including bladder ureters and prostate, need for future treatment, infection., - 10/14/2020 BPH w/LUTS - 05/22/2021, - 02/17/2021, Patient remains on Rapaflo and finasteride however still struggling with urinary frequency. Cystoscopy shows wide open prosthetic urethra. PVR is less than 100 mL today in the office. We discussed a trial of Myrbetriq 25 mg daily. I did discuss that he needs to take his blood pressure while starting medication. He will let me know if he has any adverse side effects., - 10/14/2020, - 08/11/2019, - 2020, - 2020, - 2019, - 2019, - 2019, - 2019, - 2018, Benign prostatic hyperplasia with urinary obstruction, - 2014 Urinary Urgency - 05/22/2021, - 02/17/2021, - 11/11/2020 Urinary Frequency - 02/17/2021, - 10/14/2020 Urinary Tract Inf, Unspec site - 01/01/2021 Urinary Retention - 01/19/2020 ED due to arterial insufficiency, Erectile dysfunction due to  arterial insufficiency - 2014 Urinary Retention, Unspec, Incomplete bladder emptying - 2014 History of bladder cancer      PMH Notes: .   NON-GU PMH: Arthritis GERD Hypercholesterolemia Hypertension Hypothyroidism    FAMILY HISTORY: Alzheimer's Disease - Mother Brain tumor - Mother Breast Cancer - Mother Cardiac Devices Pacemaker Present - Sister copd - Father Death of family member - Father, Mother Diabetes - Brother Hypertension - Runs In Family Kidney Stones - Son, Runs in Family    Notes: 2 sons; 1 daughter   SOCIAL HISTORY: Marital Status: Widowed Preferred Language: English; Ethnicity: Not Hispanic Or Latino; Race: White Current Smoking Status: Patient has never smoked.   Tobacco Use Assessment Completed: Used Tobacco in last 30 days? Drinks 4 drinks per month.  Drinks 4+ caffeinated drinks per day. Patient's occupation is/was retired.    REVIEW OF SYSTEMS:    GU Review Male:   Patient denies frequent urination, hard to postpone urination, burning/ pain with urination, get up at night to urinate, leakage of urine, stream starts and stops, trouble starting your stream, have to strain to urinate , erection problems, and penile pain.  Gastrointestinal (Upper):   Patient denies nausea, vomiting, and indigestion/ heartburn.  Gastrointestinal (Lower):   Patient denies diarrhea and constipation.  Constitutional:   Patient denies fever, night sweats, weight loss, and fatigue.  Skin:   Patient denies skin rash/ lesion and itching.  Eyes:   Patient denies blurred vision and double vision.  Ears/ Nose/ Throat:   Patient denies sore throat and sinus problems.  Hematologic/Lymphatic:   Patient denies swollen glands and easy bruising.  Cardiovascular:   Patient denies leg swelling and chest pains.  Respiratory:   Patient denies cough and shortness of breath.  Endocrine:   Patient denies excessive thirst.  Musculoskeletal:   Patient denies back pain and joint pain.   Neurological:   Patient denies headaches and dizziness.  Psychologic:   Patient denies depression and anxiety.   VITAL SIGNS: None   MULTI-SYSTEM PHYSICAL EXAMINATION:    Constitutional: Well-nourished. No physical deformities. Normally developed. Good grooming.  Respiratory: No labored breathing, no use of accessory muscles.   Cardiovascular: Normal temperature, normal extremity pulses, no swelling, no varicosities.  Skin: No paleness, no jaundice, no cyanosis. No lesion, no ulcer, no rash.  Neurologic / Psychiatric: Oriented to time, oriented to place, oriented to person. No depression, no anxiety, no agitation.     Complexity of Data:  Source Of History:  Patient, Medical Record Summary  Records Review:   Previous Doctor Records, Previous Patient Records  Urine Test Review:   Urinalysis, Urine Culture   08/18/08 02/20/08 02/17/07 10/14/05 06/23/04 07/23/03  PSA  Total PSA 2.00  2.49  2.38  2.24  2.29  2.47   Free PSA   0.79      % Free PSA   33.2        06/23/21  Urinalysis  Urine Appearance Slightly Cloudy   Urine Color Orange   Urine Glucose Neg mg/dL  Urine Bilirubin Neg mg/dL  Urine Ketones Neg mg/dL  Urine Specific Gravity 1.015   Urine Blood 3+ ery/uL  Urine pH <=5.0   Urine Protein 2+ mg/dL  Urine Urobilinogen 0.2 mg/dL  Urine Nitrites Neg   Urine Leukocyte Esterase 1+ leu/uL  Urine WBC/hpf 0 - 5/hpf   Urine RBC/hpf >60/hpf   Urine Epithelial Cells 0 - 5/hpf   Urine Bacteria Few (10-25/hpf)   Urine Mucous Not Present   Urine Yeast NS (Not Seen)   Urine Trichomonas Not Present   Urine Cystals NS (Not Seen)   Urine Casts NS (Not Seen)   Urine Sperm Not Present    PROCEDURES:          Urinalysis w/Scope - 81001 Dipstick Dipstick Cont'd Micro  Color: Orange Bilirubin: Neg WBC/hpf: 0 - 5/hpf  Appearance: Slightly Cloudy Ketones: Neg RBC/hpf: >60/hpf  Specific Gravity: 1.015 Blood: 3+ Bacteria: Few (10-25/hpf)  pH: <=5.0 Protein: 2+ Cystals: NS (Not Seen)   Glucose: Neg Urobilinogen: 0.2 Casts: NS (Not Seen)    Nitrites: Neg Trichomonas: Not Present    Leukocyte Esterase: 1+ Mucous: Not Present      Epithelial Cells: 0 - 5/hpf      Yeast: NS (Not Seen)      Sperm: Not Present    Notes:      ASSESSMENT:      ICD-10 Details  1 GU:   Gross hematuria - R48.5 Acute, Uncomplicated   PLAN:           Orders Labs CULTURE, URINE          Document Letter(s):  Created for Patient: Clinical Summary         Notes:   1. Gross Hematuria: due to continued gross hematuria in the absence of infection and pain, Dr. Claudia Desanctis decided to perform cystoscopy today. Please see her dictated note and procedure visit.   CC/HPI: cc: gross hematuria, hx of bladder cancer  06/23/21: 87 yo man with hx of high-grade Ta bladder cancer diagnosed in September 2018. Repeat TURBT on 06/25/2020 showed low-grade Ta. He underwent cystoscopy with fulguration. He also has urinary frequency and urgency that has responded well to Myrbetriq. He has completed a 2nd induction BCG. He was doing well initially after the surgery however has had worsening urinary frequency, urgency and painful urination. Cysto with fulguration 10/29/20. He comes in to see [place] today for gross hematuria. He has been experiencing this intermittently for the last few weeks.     PROCEDURES:         Flexible Cystoscopy - 52000  Risks, benefits, and some of the potential complications of the procedure were discussed at length with the patient including infection, bleeding, voiding discomfort, urinary retention, fever, chills, sepsis, and others. All questions were answered. Informed consent was obtained. Antibiotic prophylaxis was given. Sterile technique and intraurethral analgesia were used.  Meatus:  Normal size. Normal location. Normal condition.  Urethra:  No strictures.  External Sphincter:  Normal.  Verumontanum:  Normal.  Prostate:  Obstructing lateral lobes, prior TUR noted at bladder neck, small  nodular growth possible prostate regrowth vs bladder cancer recurrence  Bladder Neck:  Non-obstructing.  Ureteral Orifices:  Normal size. Normal shape. Effluxed clear urine.  Bladder:  Previously resected right ureteral orifice seen displaced laterally; well-healed prior TUR scars on posterior bladder wall with some slight erythema and oozing but no obvious bladder tumor recurrence       The lower urinary tract was carefully examined. The procedure was well-tolerated and without complications. Antibiotic instructions were given. Instructions were given to call the office immediately for bloody urine, difficulty urinating, urinary retention, painful or frequent urination, fever, chills, nausea, vomiting or other illness. The patient stated that he understood these instructions and would comply with them.   ASSESSMENT:      ICD-10 Details  1 GU:   Gross hematuria - R31.0 Undiagnosed New Problem  2   Bladder Cancer overlapping sites - C67.8 Chronic, Exacerbation     PLAN:           Document Letter(s):  Created for Patient: Clinical Summary         Notes:   Discussed cystocopic findings which show friable posterior bladder wall that was actively using as well as possible regrowth at bladder neck verses bladder cancer recurrence. We discussed moving forward with cystoscopy with fulguration of posterior bladder wall and biopsy of bladder neck nodule. Risks and benefits of the procedure discussed with the patient detail in his like to proceed.

## 2021-07-08 NOTE — Interval H&P Note (Signed)
History and Physical Interval Note:  07/08/2021 1:18 PM  [name] [name]  has presented today for surgery, with the diagnosis of BLADDER CANCER.  The various methods of treatment have been discussed with the patient and family. After consideration of risks, benefits and other options for treatment, the patient has consented to  Procedure(s) with comments: TRANSURETHRAL RESECTION OF BLADDER TUMOR (TURBT) (N/A) - 45 MINS CYSTOSCOPY (N/A) as a surgical intervention.  The patient's history has been reviewed, patient examined, no change in status, stable for surgery.  I have reviewed the patient's chart and labs.  Questions were answered to the patient's satisfaction.     Thurza Kwiecinski D Sheily Lineman

## 2021-07-08 NOTE — Anesthesia Preprocedure Evaluation (Addendum)
Anesthesia Evaluation  Patient identified by MRN, date of birth, ID band Patient awake    Reviewed: Allergy & Precautions, NPO status , Patient's Chart, lab work & pertinent test results  Airway Mallampati: I  TM Distance: >3 FB Neck ROM: Full    Dental  (+) Teeth Intact, Dental Advisory Given   Pulmonary sleep apnea ,    breath sounds clear to auscultation       Cardiovascular hypertension, Pt. on medications  Rhythm:Regular Rate:Normal     Neuro/Psych negative neurological ROS  negative psych ROS   GI/Hepatic Neg liver ROS, GERD  Medicated,  Endo/Other  Hypothyroidism   Renal/GU      Musculoskeletal  (+) Arthritis ,   Abdominal Normal abdominal exam  (+)   Peds  Hematology negative hematology ROS (+)   Anesthesia Other Findings   Reproductive/Obstetrics                            Anesthesia Physical Anesthesia Plan  ASA: 3  Anesthesia Plan: General   Post-op Pain Management:    Induction: Intravenous  PONV Risk Score and Plan: 3  Airway Management Planned: LMA and Oral ETT  Additional Equipment: None  Intra-op Plan:   Post-operative Plan: Extubation in OR  Informed Consent: I have reviewed the patients History and Physical, chart, labs and discussed the procedure including the risks, benefits and alternatives for the proposed anesthesia with the patient or authorized representative who has indicated his/her understanding and acceptance.     Dental advisory given  Plan Discussed with: CRNA  Anesthesia Plan Comments:        Anesthesia Quick Evaluation

## 2021-07-09 ENCOUNTER — Encounter: Payer: Self-pay | Admitting: Dermatology

## 2021-07-09 DIAGNOSIS — G4737 Central sleep apnea in conditions classified elsewhere: Secondary | ICD-10-CM | POA: Diagnosis not present

## 2021-07-09 DIAGNOSIS — G4733 Obstructive sleep apnea (adult) (pediatric): Secondary | ICD-10-CM | POA: Diagnosis not present

## 2021-07-09 NOTE — Anesthesia Postprocedure Evaluation (Signed)
Anesthesia Post Note  Patient: [name] [name]  Procedure(s) Performed: TRANSURETHRAL RESECTION OF BLADDER TUMOR (TURBT)/FULGERATION (Bladder) CYSTOSCOPY (Bladder)     Patient location during evaluation: PACU Anesthesia Type: General Level of consciousness: awake and alert Pain management: pain level controlled Vital Signs Assessment: post-procedure vital signs reviewed and stable Respiratory status: spontaneous breathing, nonlabored ventilation, respiratory function stable and patient connected to nasal cannula oxygen Cardiovascular status: blood pressure returned to baseline and stable Postop Assessment: no apparent nausea or vomiting Anesthetic complications: no   No notable events documented.              Effie Berkshire

## 2021-07-09 NOTE — Progress Notes (Signed)
   Follow-Up Visit   Subjective  [name] [name] is a 87 y.o. male who presents for the following: Procedure (Patient here today for BCC x 1 right buccal cheek).  BCC right cheek Location:  Duration:  Quality:  Associated Signs/Symptoms: Modifying Factors:  Severity:  Timing: Context:   Objective  Well appearing patient in no apparent distress; mood and affect are within normal limits. Right Buccal Cheek Lesion identified by Dr.Kambra Beachem and nurse in room. Specimen to small to send suture closure        A focused examination was performed including head and neck.. Relevant physical exam findings are noted in the Assessment and Plan.   Assessment & Plan    Basal cell carcinoma (BCC), unspecified site Right Buccal Cheek  Destruction of lesion Complexity: simple   Destruction method: electrodesiccation and curettage   Informed consent: discussed and consent obtained   Timeout:  patient name, date of birth, surgical site, and procedure verified Curettage cycles:  3 Lesion length (cm):  1 Lesion width (cm):  1 Margin per side (cm):  0 Final wound size (cm):  1 Hemostasis achieved with:  pressure Outcome: patient tolerated procedure well with no complications   Post-procedure details: wound care instructions given    Skin excision  Lesion length (cm):  1.6 Lesion width (cm):  1.6 Margin per side (cm):  0.1 Total excision diameter (cm):  1.8 Informed consent: discussed and consent obtained   Timeout: patient name, date of birth, surgical site, and procedure verified   Anesthesia: the lesion was anesthetized in a standard fashion   Anesthetic:  1% lidocaine w/ epinephrine 1-100,000 local infiltration Instrument used: #15 blade   Hemostasis achieved with: pressure and electrodesiccation   Outcome: patient tolerated procedure well with no complications   Post-procedure details: sterile dressing applied and wound care instructions given   Dressing type: bandage,  petrolatum and pressure dressing   Additional details:  5-0 ETHLION X   Curettage showed this to be a relatively deep lesion so after cautery of the base and margins repeat curettage was done, followed by narrow margin excision with simple closure.  The excised specimen was friable so unable to be preserved for microscopic margins; we will follow this with eyeball examination.      I, Lavonna Monarch, MD, have reviewed all documentation for this visit.  The documentation on 07/09/21 for the exam, diagnosis, procedures, and orders are all accurate and complete.

## 2021-07-14 DIAGNOSIS — C674 Malignant neoplasm of posterior wall of bladder: Secondary | ICD-10-CM | POA: Diagnosis not present

## 2021-07-15 DIAGNOSIS — N184 Chronic kidney disease, stage 4 (severe): Secondary | ICD-10-CM | POA: Diagnosis not present

## 2021-07-15 DIAGNOSIS — I700 Atherosclerosis of aorta: Secondary | ICD-10-CM | POA: Diagnosis not present

## 2021-07-15 DIAGNOSIS — I129 Hypertensive chronic kidney disease with stage 1 through stage 4 chronic kidney disease, or unspecified chronic kidney disease: Secondary | ICD-10-CM | POA: Diagnosis not present

## 2021-07-15 DIAGNOSIS — H6123 Impacted cerumen, bilateral: Secondary | ICD-10-CM | POA: Diagnosis not present

## 2021-07-15 DIAGNOSIS — R600 Localized edema: Secondary | ICD-10-CM | POA: Diagnosis not present

## 2021-07-15 DIAGNOSIS — I10 Essential (primary) hypertension: Secondary | ICD-10-CM | POA: Diagnosis not present

## 2021-07-15 DIAGNOSIS — E782 Mixed hyperlipidemia: Secondary | ICD-10-CM | POA: Diagnosis not present

## 2021-07-15 DIAGNOSIS — E039 Hypothyroidism, unspecified: Secondary | ICD-10-CM | POA: Diagnosis not present

## 2021-07-15 DIAGNOSIS — Z0000 Encounter for general adult medical examination without abnormal findings: Secondary | ICD-10-CM | POA: Diagnosis not present

## 2021-07-15 DIAGNOSIS — R7309 Other abnormal glucose: Secondary | ICD-10-CM | POA: Diagnosis not present

## 2021-07-23 DIAGNOSIS — N1832 Chronic kidney disease, stage 3b: Secondary | ICD-10-CM | POA: Diagnosis not present

## 2021-07-23 DIAGNOSIS — E039 Hypothyroidism, unspecified: Secondary | ICD-10-CM | POA: Diagnosis not present

## 2021-07-23 DIAGNOSIS — I129 Hypertensive chronic kidney disease with stage 1 through stage 4 chronic kidney disease, or unspecified chronic kidney disease: Secondary | ICD-10-CM | POA: Diagnosis not present

## 2021-07-23 DIAGNOSIS — E782 Mixed hyperlipidemia: Secondary | ICD-10-CM | POA: Diagnosis not present

## 2021-07-30 DIAGNOSIS — R311 Benign essential microscopic hematuria: Secondary | ICD-10-CM | POA: Diagnosis not present

## 2021-07-30 DIAGNOSIS — C678 Malignant neoplasm of overlapping sites of bladder: Secondary | ICD-10-CM | POA: Diagnosis not present

## 2021-07-30 DIAGNOSIS — R310 Gross hematuria: Secondary | ICD-10-CM | POA: Diagnosis not present

## 2021-07-30 DIAGNOSIS — R350 Frequency of micturition: Secondary | ICD-10-CM | POA: Diagnosis not present

## 2021-08-25 DIAGNOSIS — Z20822 Contact with and (suspected) exposure to covid-19: Secondary | ICD-10-CM | POA: Diagnosis not present

## 2021-08-25 DIAGNOSIS — G4733 Obstructive sleep apnea (adult) (pediatric): Secondary | ICD-10-CM | POA: Diagnosis not present

## 2021-08-26 DIAGNOSIS — T148XXA Other injury of unspecified body region, initial encounter: Secondary | ICD-10-CM | POA: Diagnosis not present

## 2021-08-26 DIAGNOSIS — M542 Cervicalgia: Secondary | ICD-10-CM | POA: Diagnosis not present

## 2021-09-05 DIAGNOSIS — I10 Essential (primary) hypertension: Secondary | ICD-10-CM | POA: Diagnosis not present

## 2021-09-05 DIAGNOSIS — T148XXA Other injury of unspecified body region, initial encounter: Secondary | ICD-10-CM | POA: Diagnosis not present

## 2021-09-05 DIAGNOSIS — R600 Localized edema: Secondary | ICD-10-CM | POA: Diagnosis not present

## 2021-09-05 DIAGNOSIS — M542 Cervicalgia: Secondary | ICD-10-CM | POA: Diagnosis not present

## 2021-09-18 DIAGNOSIS — C678 Malignant neoplasm of overlapping sites of bladder: Secondary | ICD-10-CM | POA: Diagnosis not present

## 2021-09-18 DIAGNOSIS — R310 Gross hematuria: Secondary | ICD-10-CM | POA: Diagnosis not present

## 2021-09-22 DIAGNOSIS — E039 Hypothyroidism, unspecified: Secondary | ICD-10-CM | POA: Diagnosis not present

## 2021-09-22 DIAGNOSIS — I129 Hypertensive chronic kidney disease with stage 1 through stage 4 chronic kidney disease, or unspecified chronic kidney disease: Secondary | ICD-10-CM | POA: Diagnosis not present

## 2021-09-22 DIAGNOSIS — N1832 Chronic kidney disease, stage 3b: Secondary | ICD-10-CM | POA: Diagnosis not present

## 2021-09-22 DIAGNOSIS — E782 Mixed hyperlipidemia: Secondary | ICD-10-CM | POA: Diagnosis not present

## 2021-10-06 DIAGNOSIS — N184 Chronic kidney disease, stage 4 (severe): Secondary | ICD-10-CM | POA: Diagnosis not present

## 2021-10-07 ENCOUNTER — Other Ambulatory Visit: Payer: Self-pay

## 2021-10-07 ENCOUNTER — Ambulatory Visit: Payer: Medicare Other | Admitting: Dermatology

## 2021-10-07 ENCOUNTER — Encounter: Payer: Self-pay | Admitting: Dermatology

## 2021-10-07 DIAGNOSIS — L820 Inflamed seborrheic keratosis: Secondary | ICD-10-CM | POA: Diagnosis not present

## 2021-10-07 DIAGNOSIS — D485 Neoplasm of uncertain behavior of skin: Secondary | ICD-10-CM

## 2021-10-07 DIAGNOSIS — Z85828 Personal history of other malignant neoplasm of skin: Secondary | ICD-10-CM

## 2021-10-07 DIAGNOSIS — L570 Actinic keratosis: Secondary | ICD-10-CM

## 2021-10-07 NOTE — Patient Instructions (Signed)

## 2021-10-14 DIAGNOSIS — D509 Iron deficiency anemia, unspecified: Secondary | ICD-10-CM | POA: Diagnosis not present

## 2021-10-14 DIAGNOSIS — R809 Proteinuria, unspecified: Secondary | ICD-10-CM | POA: Diagnosis not present

## 2021-10-14 DIAGNOSIS — I129 Hypertensive chronic kidney disease with stage 1 through stage 4 chronic kidney disease, or unspecified chronic kidney disease: Secondary | ICD-10-CM | POA: Diagnosis not present

## 2021-10-14 DIAGNOSIS — G4733 Obstructive sleep apnea (adult) (pediatric): Secondary | ICD-10-CM | POA: Diagnosis not present

## 2021-10-14 DIAGNOSIS — G4737 Central sleep apnea in conditions classified elsewhere: Secondary | ICD-10-CM | POA: Diagnosis not present

## 2021-10-14 DIAGNOSIS — E875 Hyperkalemia: Secondary | ICD-10-CM | POA: Diagnosis not present

## 2021-10-14 DIAGNOSIS — C679 Malignant neoplasm of bladder, unspecified: Secondary | ICD-10-CM | POA: Diagnosis not present

## 2021-10-14 DIAGNOSIS — R319 Hematuria, unspecified: Secondary | ICD-10-CM | POA: Diagnosis not present

## 2021-10-14 DIAGNOSIS — N2581 Secondary hyperparathyroidism of renal origin: Secondary | ICD-10-CM | POA: Diagnosis not present

## 2021-10-14 DIAGNOSIS — N184 Chronic kidney disease, stage 4 (severe): Secondary | ICD-10-CM | POA: Diagnosis not present

## 2021-11-01 ENCOUNTER — Encounter: Payer: Self-pay | Admitting: Dermatology

## 2021-11-01 NOTE — Progress Notes (Signed)
   Follow-Up Visit   Subjective  [name] [name] is a 87 y.o. male who presents for the following: Follow-up (Patient here today for follow up from treatment of [place] on right buccal cheek. Per patient the area did heal well. Per patient he has a lesion on his left frontal scalp x a few days and right forehead x a few days no bleeding, per patient the lesions are painful. Check dark lesion on right forehead x unsure).  New painful spot on crust plus check several other areas Location:  Duration:  Quality:  Associated Signs/Symptoms: Modifying Factors:  Severity:  Timing: Context:   Objective  Well appearing patient in no apparent distress; mood and affect are within normal limits. Left Frontal Scalp 7 mm waxy pink crust, rule out superficial carcinoma       Right Forehead Gritty 5 mm pink crust  Right Parotid Area No sign residual carcinoma but there are multiple small actinic keratoses which we will observe for now         A focused examination was performed including head and neck.. Relevant physical exam findings are noted in the Assessment and Plan.   Assessment & Plan    Neoplasm of uncertain behavior of skin Left Frontal Scalp  Skin / nail biopsy Type of biopsy: tangential   Informed consent: discussed and consent obtained   Timeout: patient name, date of birth, surgical site, and procedure verified   Procedure prep:  Patient was prepped and draped in usual sterile fashion (Non sterile) Prep type:  Chlorhexidine Anesthesia: the lesion was anesthetized in a standard fashion   Anesthetic:  1% lidocaine w/ epinephrine 1-100,000 local infiltration Instrument used: flexible razor blade   Hemostasis achieved with: ferric subsulfate and electrodesiccation   Outcome: patient tolerated procedure well   Post-procedure details: sterile dressing applied and wound care instructions given   Dressing type: bandage and petrolatum    Specimen 1 - Surgical  pathology Differential Diagnosis: R/O BCC vs SCC - cautery after biopsy  Check Margins: No  AK (actinic keratosis) Right Forehead  Destruction of lesion - Right Forehead Complexity: simple   Destruction method: cryotherapy   Informed consent: discussed and consent obtained   Lesion destroyed using liquid nitrogen: Yes   Cryotherapy cycles:  3 Outcome: patient tolerated procedure well with no complications    History of basal cell carcinoma (BCC) Right Parotid Area  Check as needed change      I, Lavonna Monarch, MD, have reviewed all documentation for this visit.  The documentation on 11/01/21 for the exam, diagnosis, procedures, and orders are all accurate and complete.

## 2021-11-13 DIAGNOSIS — C678 Malignant neoplasm of overlapping sites of bladder: Secondary | ICD-10-CM | POA: Diagnosis not present

## 2021-11-21 ENCOUNTER — Other Ambulatory Visit: Payer: Self-pay | Admitting: Urology

## 2021-11-21 DIAGNOSIS — E039 Hypothyroidism, unspecified: Secondary | ICD-10-CM | POA: Diagnosis not present

## 2021-11-21 DIAGNOSIS — N1832 Chronic kidney disease, stage 3b: Secondary | ICD-10-CM | POA: Diagnosis not present

## 2021-11-21 DIAGNOSIS — I129 Hypertensive chronic kidney disease with stage 1 through stage 4 chronic kidney disease, or unspecified chronic kidney disease: Secondary | ICD-10-CM | POA: Diagnosis not present

## 2021-11-21 DIAGNOSIS — E782 Mixed hyperlipidemia: Secondary | ICD-10-CM | POA: Diagnosis not present

## 2021-11-26 ENCOUNTER — Other Ambulatory Visit: Payer: Self-pay

## 2021-11-26 ENCOUNTER — Encounter (HOSPITAL_COMMUNITY): Payer: Self-pay | Admitting: Urology

## 2021-11-26 NOTE — Progress Notes (Addendum)
For Short Stay: [place] appointment date:N/A Date of COVID positive in last 67 days:N/A   For Anesthesia: PCP - [name], MD Cardiologist - Buford Dresser, MD last office visit note 05/01/21 in epic  Chest x-ray - 04/11/21 in epic EKG - 05/01/21 in epic Stress Test - 08/22/20 in epic ECHO - 05/03/20 in epic Cardiac Cath - N/A Pacemaker/ICD device last checked: N/A Pacemaker orders received:N/A Device Rep notified:N/A  Sleep Study - 09/10/19 in epic CPAP - Yes  Fasting Blood Sugar - N/A Checks Blood Sugar __N/A___ times a day  Blood Thinner Instructions: N/A Aspirin Instructions: Will stop 5 days prior to procedure  Last Dose:   Activity level: activities of daily living without stopping and without chest pain and/or shortness of breath   Anesthesia review: CKDIII, OSA, HTN  Patient denies shortness of breath, fever, cough and chest pain at PAT appointment   Patient verbalized understanding of instructions that were given to them at the PAT appointment. Patient was also instructed that they will need to review over the PAT instructions again at home before surgery.

## 2021-11-27 NOTE — Progress Notes (Signed)
Anesthesia Chart Review   Case: [id number] Date/Time: 12/02/21 0915   Procedure: TRANSURETHRAL RESECTION OF BLADDER TUMOR (TURBT)   Anesthesia type: General   Pre-op diagnosis: BLADDER CANCER   Location: [facility] ROOM 04 / [facility]   Surgeons: Robley Fries, MD       DISCUSSION:88 y.o. never smoker with h/o HTN, GERD, sleep apnea, CKD Stage III, bladder cancer scheduled for above procedure 12/02/2021 with Dr. Jacalyn Lefevre.   Pt last seen by cardiology 05/01/2021, stable at this visit with 1 year follow up recommended.   Pt same day workup, labs DOS.  VS: Ht 5\' 8"  (1.727 m)    Wt 81.2 kg    BMI 27.22 kg/m   PROVIDERS: [name], MD is PCP    LABS:  labs DOS (all labs ordered are listed, but only abnormal results are displayed)  Labs Reviewed - No data to display   IMAGES:   EKG: 05/01/2021 Rate 67 bpm  NSR  CV: ETT 05/24/2020 Blood pressure demonstrated a hypertensive response to exercise. Upsloping ST segment depression ST segment depression was noted during stress in the III, II, aVF, V6, V5 and V4 leads. Negative, adequate stress test.  Echo 05/03/2020 1. Left ventricular ejection fraction, by estimation, is 55 to 60%. The  left ventricle has normal function. The left ventricle has no regional  wall motion abnormalities. There is mild concentric left ventricular  hypertrophy. Left ventricular diastolic  parameters were normal.   2. Right ventricular systolic function is normal. The right ventricular  size is normal.   3. Left atrial size was mildly dilated.   4. The mitral valve is normal in structure. Trivial mitral valve  regurgitation. No evidence of mitral stenosis.   5. The aortic valve is tricuspid. Aortic valve regurgitation is mild.  Mild aortic valve sclerosis is present, with no evidence of aortic valve  stenosis.   6. There is mild dilatation of the ascending aorta measuring 39 mm.   7. The inferior vena cava is normal in size with greater than  50%  respiratory variability, suggesting right atrial pressure of 3 mmHg.  Past Medical History:  Diagnosis Date   Anemia    Arthritis    lower back bulging disc, hips, knees, thumbs, shoulder   Bladder cancer ([place]) UROLOGIST-  DR MCKENZIE   S/P TURBT 08-16-2017   BPH (benign prostatic hyperplasia)    CKD (chronic kidney disease), stage III ([place])    STAGE 3  B CKD per lov 10-16-2020 dr Rica Mast Jeri Cos kidney on chart   Dyspnea    Fatigue    MIDDLE OF DAY ON OCCASION   Frequency of urination    GERD (gastroesophageal reflux disease)    on protonix   Hematuria    Hematuria    History of colon polyps    HLD (hyperlipidemia)    HOH (hard of hearing)    slightly hoh right worse thanleft   HTN (hypertension)    Hypothyroidism    Incontinence of urine    weras pads all the time   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   Right Buccal Cheek   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-- urologist- dr Alyson Ingles   s/p  TURBT 08-16-2017  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   Sleep apnea    uses cpap set on 20 /4   Trigger finger of left hand 07/04/2021   last few months per pt   UTI (urinary tract infection)  08/20/2020   UTI (urinary tract infection)    finished ampicillin on 06-13-2021 all symptoms resolved   Weakness of extremity    left knee   Wears glasses     Past Surgical History:  Procedure Laterality Date   BCG X 2     AFTER LAST 2 BLADDER TUMOR REMOVALS   CATARACT EXTRACTION, BILATERAL     COLONSCOPY  YRS AGO   CYSTOSCOPY N/A 10/29/2020   Procedure: CYSTOSCOPY;  Surgeon: Robley Fries, MD;  Location: [facility];  Service: Urology;  Laterality: N/A;   CYSTOSCOPY N/A 07/08/2021   Procedure: CYSTOSCOPY;  Surgeon: Robley Fries, MD;  Location: [facility];  Service: Urology;  Laterality: N/A;   CYSTOSCOPY W/ URETERAL STENT PLACEMENT Bilateral 08/16/2017   Procedure:  CYSTOSCOPY WITH RETROGRADE PYELOGRAM/URETERAL STENT PLACEMENT;  Surgeon: Cleon Gustin, MD;  Location: [facility];  Service: Urology;  Laterality: Bilateral;   CYSTOSCOPY WITH BIOPSY N/A 06/25/2020   Procedure: CYSTOSCOPY FULGURATION   BLADDER  BIOPSY;  Surgeon: Robley Fries, MD;  Location: [facility];  Service: Urology;  Laterality: N/A;   CYSTOSCOPY WITH FULGERATION N/A 12/08/2019   Procedure: CYSTOSCOPY WITH FULGERATION;  Surgeon: Cleon Gustin, MD;  Location: [facility];  Service: Urology;  Laterality: N/A;   KNEE ARTHROSCOPY Left 06/10/2011   PROSTATE BIOPSY  2000   TRANSURETHRAL RESECTION OF BLADDER TUMOR N/A 08/16/2017   Procedure: TRANSURETHRAL RESECTION OF BLADDER TUMOR (TURBT);  Surgeon: Cleon Gustin, MD;  Location: [facility];  Service: Urology;  Laterality: N/A;   TRANSURETHRAL RESECTION OF BLADDER TUMOR  12-09-2012   dr Alona Bene [facility]   and TURP   TRANSURETHRAL RESECTION OF BLADDER TUMOR N/A 09/20/2017   Procedure: TRANSURETHRAL RESECTION OF BLADDER TUMOR (TURBT);  Surgeon: Cleon Gustin, MD;  Location: [facility];  Service: Urology;  Laterality: N/A;   TRANSURETHRAL RESECTION OF BLADDER TUMOR N/A 12/08/2019   Procedure: TRANSURETHRAL RESECTION OF BLADDER TUMOR;  Surgeon: Cleon Gustin, MD;  Location: [facility];  Service: Urology;  Laterality: N/A;  1 HR   TRANSURETHRAL RESECTION OF BLADDER TUMOR N/A 10/29/2020   Procedure: BLADDER FULGERATION;  Surgeon: Robley Fries, MD;  Location: [facility];  Service: Urology;  Laterality: N/A;  30 MINS   TRANSURETHRAL RESECTION OF BLADDER TUMOR N/A 07/08/2021   Procedure: TRANSURETHRAL RESECTION OF BLADDER TUMOR (TURBT)/FULGERATION;  Surgeon: Robley Fries, MD;  Location: [facility];  Service: Urology;  Laterality: N/A;  [place]  2011    UPPER GI ENDOSCOPY  YRS AGO    MEDICATIONS: No current facility-administered medications for this encounter.    acetaminophen (TYLENOL) 500 MG tablet   amLODipine (NORVASC) 10 MG tablet   Ascorbic Acid (VITAMIN C PO)   aspirin 81 MG tablet   Cholecalciferol (VITAMIN D) 50 MCG (2000 UT) tablet   ferrous sulfate 325 (65 FE) MG tablet   finasteride (PROSCAR) 5 MG tablet   irbesartan (AVAPRO) 75 MG tablet   levothyroxine (SYNTHROID, LEVOTHROID) 50 MCG tablet   mirabegron ER (MYRBETRIQ) 50 MG TB24 tablet   pantoprazole (PROTONIX) 40 MG tablet   psyllium (METAMUCIL) 58.6 % powder   rosuvastatin (CRESTOR) 5 MG tablet   silodosin (RAPAFLO) 8 MG CAPS capsule   zinc gluconate 50 MG tablet     [facility], PA-C [facility] Pre-Surgical Testing [phone]

## 2021-12-01 ENCOUNTER — Other Ambulatory Visit: Payer: Self-pay | Admitting: Urology

## 2021-12-01 NOTE — H&P (Signed)
CC/HPI: cc: postop bladder cancer   8/22/: "[name] [name]" is an 88 yo man with hx of high-grade Ta bladder cancer diagnosed in September 2018. Repeat TURBT on 06/25/2020 showed low-grade Ta. He underwent cystoscopy with fulguration. He also has urinary frequency and urgency that has responded well to Myrbetriq. He has completed a 2nd induction BCG. He was doing well initially after the surgery however has had worsening urinary frequency, urgency and painful urination. Cysto with fulguration 10/29/20. Repeat surveillance cystoscopy 2 weeks ago showed very early regrowth consistent with recurrence. He underwent cystoscopy with fulguration on 07/08/2021. He has had some hematuria since then and urinary frequency. He denies worsening urinary urgency urge incontinence.   07/30/2021: 88 year old male who who presents today for follow-up after undergoing cystoscopy with fulguration on 07/08/2021. He continued to have hematuria and was seen on 07/14/2021. He was continued on Myrbetriq as well as encouraged to increase his hydration. He presents today for a PVR and re-evaluation of symptoms. Hematuria continues, however, it is getting lighter. He does have some dysuria ocassionally as well. He is emptying his bladder and has a PVR of 51 mls. He is still have some frequency.   09/18/21: Here for surveillance cystoscopy. Patient's last cystoscopy with fulguration was 07/08/2021. He continues to have intermittent hematuria since then. He states it has been improving and he will have periods where it will be no blood and just when he thinks it is gone he will have some. He strained himself more yesterday and then noted blood today. He has passed 1 clot.   11-21-21: Here for surveillance cysto. He's continued to have intermittent gross hematuria. No dysuria.     ALLERGIES: Cipro TABS - Diarrhea, **Diarrhea if taking 750mg , 500mg  ok to take** Macrobid - Respiratory Distress    MEDICATIONS: Finasteride 5 mg tablet 1 tablet PO  Daily  Levothyroxine Sodium 50 mcg tablet  Myrbetriq 50 mg tablet, extended release 24 hr  Silodosin 8 mg capsule TAKE ONE CAPSULE BY MOUTH at bed  Amlodipine Besylate 10 mg tablet tablet  Ferrous Fumarate  Irbesartan 300 mg tablet  Metamucil  Pantoprazole Sodium 40 mg tablet, delayed release  Rosuvastatin Calcium 5 mg tablet  Vitamin D3  Zinc 30 mg tablet     GU PSH: Bladder Instill AntiCA Agent - 02/04/2021, 01/28/2021, 01/21/2021, 09/05/2020, 08/15/2020, 2021, 2021, 2021, 2021, 2021, 2021, 2019, 2018, 2018, 2018, 2018, 2018 Cysto Bladder Ureth Biopsy - 06/25/2020 Cystoscopy - 09/18/2021, 06/23/2021, 05/22/2021, 02/17/2021, 10/14/2020, 05/16/2020, 2020, 2020, 2020, 2020, 2019, 2019, 2019, 2019, 2018 Cystoscopy Insert Stent, Right - 2018 Cystoscopy TURBT >5 cm - 2021, 2018 Cystoscopy TURBT 2-5 cm - 07/08/2021, 10/29/2020, 2018 Cystoscopy TURP Locm 300-399Mg /Ml Iodine,1Ml - 2018 Remove Prostate Regrowth - 2021     NON-GU PSH: Cataract surgery - 2019 Knee Arthroscopy, Left     GU PMH: Bladder Cancer overlapping sites - 09/18/2021, - 07/30/2021, - 06/23/2021, - 02/27/2021, - 02/17/2021, - 02/04/2021, - 01/28/2021, - 01/21/2021, - 01/14/2021, - 12/27/2020, - 11/11/2020, - 09/11/2020, - 09/05/2020, - 08/15/2020, - 08/08/2020, - 2020, - 2020, - 2020, - 2019, - 2019, - 2019, - 2019, - 2018, - 2018 Gross hematuria - 09/18/2021, - 07/30/2021 (Stable), - 06/23/2021, - 06/23/2021, - 06/09/2021 (Stable), - 02/27/2021, Improved, - 2021, - 2021, - 2021, - 2020 (Improving, Chronic), Reassured at this time if urine is only light pink/clear cherry he does not need admitting for CBI. He will continue with ABX as Rx'd and proceed with cysto on 09/20/17 as scheduled. Instructed pt and  family that if he develops gross hematuria with clots will need to go immediately to [facility] ER for evaluation. Culture urine. Will continue with current ABX. , - 2018, - 2018, - 2018, - 2018 Urinary Frequency - 07/30/2021, - 02/17/2021, - 10/14/2020 Bladder Cancer  Posterior - 07/14/2021, - 06/09/2021, - 05/22/2021, Patient with possible bladder tumor recurrence on cystoscopy today. He has active oozing from posterior bladder wall in between 2 prior TUR sites with irregular bladder mucosa. Given his recent recurrence we discussed proceeding with cystoscopy, bladder biopsy and fulguration of abnormal tissue. Will also discussed with patient intravesical gemcitabine. Risks and benefits of the procedure discussed with the patient including but not limited to pain, bleeding, possible need for Foley catheter, injury to surrounding structures including bladder ureters and prostate, need for future treatment, infection., - 10/14/2020 Acute Cystitis/UTI - 06/09/2021, - 02/27/2021, - 12/27/2020, - 11/11/2020, - 07/05/2020 BPH w/LUTS - 05/22/2021, - 02/17/2021, Patient remains on Rapaflo and finasteride however still struggling with urinary frequency. Cystoscopy shows wide open prosthetic urethra. PVR is less than 100 mL today in the office. We discussed a trial of Myrbetriq 25 mg daily. I did discuss that he needs to take his blood pressure while starting medication. He will let me know if he has any adverse side effects., - 10/14/2020, - 2020, - 2020, - 2020, - 2019, - 2019, - 2019, - 2019, - 2018, Benign prostatic hyperplasia with urinary obstruction, - 2014 Urinary Urgency - 05/22/2021, - 02/17/2021, - 11/11/2020 Urinary Tract Inf, Unspec site - 01/01/2021 Urinary Retention - 2021 ED due to arterial insufficiency, Erectile dysfunction due to arterial insufficiency - 2014 Urinary Retention, Unspec, Incomplete bladder emptying - 2014 History of bladder cancer      PMH Notes: .   NON-GU PMH: Arthritis GERD Hypercholesterolemia Hypertension Hypothyroidism    FAMILY HISTORY: Alzheimer's Disease - Mother Brain tumor - Mother Breast Cancer - Mother Cardiac Devices Pacemaker Present - Sister copd - Father Death of family member - Father, Mother Diabetes - Brother Hypertension -  Runs In Family Kidney Stones - Son, Runs in Family    Notes: 2 sons; 1 daughter   SOCIAL HISTORY: Marital Status: Widowed Preferred Language: English; Ethnicity: Not Hispanic Or Latino; Race: White Current Smoking Status: Patient has never smoked.   Tobacco Use Assessment Completed: Used Tobacco in last 30 days? Drinks 4 drinks per month.  Drinks 4+ caffeinated drinks per day. Patient's occupation is/was retired.    REVIEW OF SYSTEMS:    GU Review Male:   Patient denies frequent urination, hard to postpone urination, burning/ pain with urination, get up at night to urinate, leakage of urine, stream starts and stops, trouble starting your stream, have to strain to urinate , erection problems, and penile pain.  Gastrointestinal (Upper):   Patient denies nausea, vomiting, and indigestion/ heartburn.  Gastrointestinal (Lower):   Patient denies diarrhea and constipation.  Constitutional:   Patient denies fever, night sweats, weight loss, and fatigue.  Skin:   Patient denies skin rash/ lesion and itching.  Eyes:   Patient denies blurred vision and double vision.  Ears/ Nose/ Throat:   Patient denies sore throat and sinus problems.  Hematologic/Lymphatic:   Patient denies swollen glands and easy bruising.  Cardiovascular:   Patient denies chest pains and leg swelling.  Respiratory:   Patient denies cough and shortness of breath.  Endocrine:   Patient denies excessive thirst.  Musculoskeletal:   Patient denies back pain and joint pain.  Neurological:   Patient denies  headaches and dizziness.  Psychologic:   Patient denies depression and anxiety.   VITAL SIGNS: None   MULTI-SYSTEM PHYSICAL EXAMINATION:    Constitutional: Well-nourished. No physical deformities. Normally developed. Good grooming.  Neck: Neck symmetrical, not swollen. Normal tracheal position.  Respiratory: No labored breathing, no use of accessory muscles.   Skin: No paleness, no jaundice, no cyanosis. No lesion, no ulcer,  no rash.  Neurologic / Psychiatric: Oriented to time, oriented to place, oriented to person. No depression, no anxiety, no agitation.  Gastrointestinal: No rigidity, non obese abdomen.   Eyes: Normal conjunctivae. Normal eyelids.  Ears, Nose, Mouth, and Throat: Left ear no scars, no lesions, no masses. Right ear no scars, no lesions, no masses. Nose no scars, no lesions, no masses. Normal hearing. Normal lips.  Musculoskeletal: Normal gait and station of head and neck.     Complexity of Data:  Records Review:   Previous Patient Records, POC Tool  Urine Test Review:   Urinalysis   08/18/08 02/20/08 02/17/07 10/14/05 06/23/04 07/23/03  PSA  Total PSA 2.00  2.49  2.38  2.24  2.29  2.47   Free PSA   0.79      % Free PSA   33.2        PROCEDURES:         Flexible Cystoscopy - 52000  Risks, benefits, and some of the potential complications of the procedure were discussed at length with the patient including infection, bleeding, voiding discomfort, urinary retention, fever, chills, sepsis, and others. All questions were answered. Informed consent was obtained. Sterile technique and intraurethral analgesia were used.  Meatus:  Normal size. Normal location. Normal condition.  Urethra:  No strictures.  External Sphincter:  Normal.  Verumontanum:  Normal.  Prostate:  Evidence of prior TUR. Wide open.   Bladder Neck:  Non-obstructing.  Ureteral Orifices:  Normal location. Normal size. Normal shape. Effluxed clear urine.  Bladder:  No trabeculation. Small amount of erythema on inferior bladder mucosa just beyond trigone. No discrete masses. Blood settled in dependent part of bladder.       The lower urinary tract was carefully examined. The procedure was well-tolerated and without complications. Antibiotic instructions were given. Instructions were given to call the office immediately for bloody urine, difficulty urinating, urinary retention, painful or frequent urination, fever, chills, nausea,  vomiting or other illness. The patient stated that he understood these instructions and would comply with them.         Urinalysis w/Scope Dipstick Dipstick Cont'd Micro  Color: Red Bilirubin: Neg mg/dL WBC/hpf: 6 - 10/hpf  Appearance: Cloudy Ketones: Neg mg/dL RBC/hpf: >60/hpf  Specific Gravity: 1.020 Blood: 3+ ery/uL Bacteria: Mod (26-50/hpf)  pH: <=5.0 Protein: 2+ mg/dL Cystals: NS (Not Seen)  Glucose: Neg mg/dL Urobilinogen: 0.2 mg/dL Casts: NS (Not Seen)    Nitrites: Neg Trichomonas: Not Present    Leukocyte Esterase: 2+ leu/uL Mucous: Not Present      Epithelial Cells: NS (Not Seen)      Yeast: NS (Not Seen)      Sperm: Not Present    Notes: Unspun micro due to clarity    ASSESSMENT:      ICD-10 Details  1 GU:   Bladder Cancer overlapping sites - C67.8 Chronic, Worsening   PLAN:           Document Letter(s):  Created for Patient: Clinical Summary         Notes:   Cystoscopy with erythematous areas that are likely intermittently bleeding.  Risks and benefits of cystoscopy, TURBT, bladder biopsy and fulguration discussed with the patient in details. These include but are not limited to bleeding, infection, pain, need for foley catheter, bladder perforation, need for additional procedures or treatment, damage to surrounding structures. Pt to be scheduled for surgery.

## 2021-12-02 ENCOUNTER — Ambulatory Visit (HOSPITAL_COMMUNITY)
Admission: RE | Admit: 2021-12-02 | Discharge: 2021-12-02 | Disposition: A | Discharge status: Home / Self Care | Payer: Medicare Other | Attending: Urology | Admitting: Urology

## 2021-12-02 ENCOUNTER — Encounter (HOSPITAL_COMMUNITY): Payer: Self-pay | Admitting: Urology

## 2021-12-02 ENCOUNTER — Other Ambulatory Visit: Payer: Self-pay

## 2021-12-02 ENCOUNTER — Ambulatory Visit (HOSPITAL_COMMUNITY): Payer: Medicare Other | Admitting: Physician Assistant

## 2021-12-02 ENCOUNTER — Encounter (HOSPITAL_COMMUNITY)
Admission: RE | Disposition: A | Discharge status: Home / Self Care | Payer: Self-pay | Source: Home / Self Care | Attending: Urology

## 2021-12-02 DIAGNOSIS — D494 Neoplasm of unspecified behavior of bladder: Secondary | ICD-10-CM | POA: Diagnosis not present

## 2021-12-02 DIAGNOSIS — N183 Chronic kidney disease, stage 3 unspecified: Secondary | ICD-10-CM

## 2021-12-02 DIAGNOSIS — R350 Frequency of micturition: Secondary | ICD-10-CM | POA: Insufficient documentation

## 2021-12-02 DIAGNOSIS — R3915 Urgency of urination: Secondary | ICD-10-CM | POA: Diagnosis not present

## 2021-12-02 DIAGNOSIS — D414 Neoplasm of uncertain behavior of bladder: Secondary | ICD-10-CM | POA: Diagnosis not present

## 2021-12-02 DIAGNOSIS — N401 Enlarged prostate with lower urinary tract symptoms: Secondary | ICD-10-CM | POA: Diagnosis not present

## 2021-12-02 DIAGNOSIS — D303 Benign neoplasm of bladder: Secondary | ICD-10-CM | POA: Diagnosis not present

## 2021-12-02 DIAGNOSIS — Z01818 Encounter for other preprocedural examination: Secondary | ICD-10-CM

## 2021-12-02 DIAGNOSIS — G4733 Obstructive sleep apnea (adult) (pediatric): Secondary | ICD-10-CM | POA: Diagnosis not present

## 2021-12-02 DIAGNOSIS — C678 Malignant neoplasm of overlapping sites of bladder: Secondary | ICD-10-CM | POA: Diagnosis not present

## 2021-12-02 DIAGNOSIS — R310 Gross hematuria: Secondary | ICD-10-CM

## 2021-12-02 DIAGNOSIS — I10 Essential (primary) hypertension: Secondary | ICD-10-CM | POA: Diagnosis not present

## 2021-12-02 DIAGNOSIS — R338 Other retention of urine: Secondary | ICD-10-CM | POA: Diagnosis not present

## 2021-12-02 DIAGNOSIS — Z9989 Dependence on other enabling machines and devices: Secondary | ICD-10-CM | POA: Diagnosis not present

## 2021-12-02 HISTORY — PX: TRANSURETHRAL RESECTION OF BLADDER TUMOR: SHX2575

## 2021-12-02 HISTORY — DX: Dyspnea, unspecified: R06.00

## 2021-12-02 HISTORY — DX: Anemia, unspecified: D64.9

## 2021-12-02 LAB — CBC
HCT: 33.4 % — ABNORMAL LOW (ref 39.0–52.0)
Hemoglobin: 10.8 g/dL — ABNORMAL LOW (ref 13.0–17.0)
MCH: 29.1 pg (ref 26.0–34.0)
MCHC: 32.3 g/dL (ref 30.0–36.0)
MCV: 90 fL (ref 80.0–100.0)
Platelets: 281 10*3/uL (ref 150–400)
RBC: 3.71 MIL/uL — ABNORMAL LOW (ref 4.22–5.81)
RDW: 15.6 % — ABNORMAL HIGH (ref 11.5–15.5)
WBC: 6.7 10*3/uL (ref 4.0–10.5)
nRBC: 0 % (ref 0.0–0.2)

## 2021-12-02 LAB — BASIC METABOLIC PANEL
Anion gap: 7 (ref 5–15)
BUN: 38 mg/dL — ABNORMAL HIGH (ref 8–23)
CO2: 20 mmol/L — ABNORMAL LOW (ref 22–32)
Calcium: 8.8 mg/dL — ABNORMAL LOW (ref 8.9–10.3)
Chloride: 111 mmol/L (ref 98–111)
Creatinine, Ser: 2.78 mg/dL — ABNORMAL HIGH (ref 0.61–1.24)
GFR, Estimated: 21 mL/min — ABNORMAL LOW (ref >60)
Glucose, Bld: 99 mg/dL (ref 70–99)
Potassium: 4 mmol/L (ref 3.5–5.1)
Sodium: 138 mmol/L (ref 135–145)

## 2021-12-02 SURGERY — TURBT (TRANSURETHRAL RESECTION OF BLADDER TUMOR)
Anesthesia: General | Site: Bladder

## 2021-12-02 MED ORDER — EPHEDRINE 5 MG/ML INJ
INTRAVENOUS | Status: AC
  Filled 2021-12-02: qty 5

## 2021-12-02 MED ORDER — ACETAMINOPHEN 500 MG PO TABS
1000.0000 mg | ORAL_TABLET | Freq: Once | ORAL | Status: AC
  Administered 2021-12-02: 1000 mg via ORAL
  Filled 2021-12-02: qty 2

## 2021-12-02 MED ORDER — OXYCODONE HCL 5 MG PO TABS
ORAL_TABLET | ORAL | Status: AC
  Filled 2021-12-02: qty 1

## 2021-12-02 MED ORDER — CLINDAMYCIN PHOSPHATE 900 MG/50ML IV SOLN
900.0000 mg | Freq: Once | INTRAVENOUS | Status: AC
  Administered 2021-12-02: 900 mg via INTRAVENOUS
  Filled 2021-12-02: qty 50

## 2021-12-02 MED ORDER — PROPOFOL 10 MG/ML IV BOLUS
INTRAVENOUS | Status: DC | PRN
Start: 2021-12-02 — End: 2021-12-02
  Administered 2021-12-02: 150 mg via INTRAVENOUS

## 2021-12-02 MED ORDER — ONDANSETRON HCL 4 MG/2ML IJ SOLN
INTRAMUSCULAR | Status: DC | PRN
  Administered 2021-12-02: 4 mg via INTRAVENOUS

## 2021-12-02 MED ORDER — DOXYCYCLINE MONOHYDRATE 100 MG PO TABS
100.0000 mg | ORAL_TABLET | Freq: Two times a day (BID) | ORAL | 0 refills | Status: DC
Start: 2021-12-02 — End: 2022-03-24

## 2021-12-02 MED ORDER — ORAL CARE MOUTH RINSE: 15.0000 mL | Freq: Once | OROMUCOSAL | Status: AC

## 2021-12-02 MED ORDER — FENTANYL CITRATE (PF) 100 MCG/2ML IJ SOLN
INTRAMUSCULAR | Status: AC
  Filled 2021-12-02: qty 2

## 2021-12-02 MED ORDER — LIDOCAINE HCL (CARDIAC) PF 100 MG/5ML IV SOSY
PREFILLED_SYRINGE | INTRAVENOUS | Status: DC | PRN
  Administered 2021-12-02: 60 mg via INTRAVENOUS

## 2021-12-02 MED ORDER — LACTATED RINGERS IV SOLN: INTRAVENOUS | Status: DC

## 2021-12-02 MED ORDER — FENTANYL CITRATE PF 50 MCG/ML IJ SOSY: 25.0000 ug | PREFILLED_SYRINGE | INTRAMUSCULAR | Status: DC | PRN

## 2021-12-02 MED ORDER — DEXAMETHASONE SODIUM PHOSPHATE 10 MG/ML IJ SOLN
INTRAMUSCULAR | Status: AC
  Filled 2021-12-02: qty 1

## 2021-12-02 MED ORDER — LIDOCAINE HCL (PF) 2 % IJ SOLN
INTRAMUSCULAR | Status: AC
  Filled 2021-12-02: qty 5

## 2021-12-02 MED ORDER — OXYCODONE HCL 5 MG PO TABS
5.0000 mg | ORAL_TABLET | Freq: Once | ORAL | Status: AC
  Administered 2021-12-02: 5 mg via ORAL

## 2021-12-02 MED ORDER — DEXAMETHASONE SODIUM PHOSPHATE 10 MG/ML IJ SOLN
INTRAMUSCULAR | Status: DC | PRN
  Administered 2021-12-02: 4 mg via INTRAVENOUS

## 2021-12-02 MED ORDER — PHENYLEPHRINE 40 MCG/ML (10ML) SYRINGE FOR IV PUSH (FOR BLOOD PRESSURE SUPPORT)
PREFILLED_SYRINGE | INTRAVENOUS | Status: DC | PRN
  Administered 2021-12-02 (×3): 80 ug via INTRAVENOUS

## 2021-12-02 MED ORDER — SODIUM CHLORIDE 0.9 % IR SOLN
Status: DC | PRN
  Administered 2021-12-02: 6000 mL via INTRAVESICAL

## 2021-12-02 MED ORDER — PROPOFOL 10 MG/ML IV BOLUS
INTRAVENOUS | Status: AC
  Filled 2021-12-02: qty 20

## 2021-12-02 MED ORDER — FENTANYL CITRATE (PF) 100 MCG/2ML IJ SOLN
INTRAMUSCULAR | Status: DC | PRN
  Administered 2021-12-02: 50 ug via INTRAVENOUS
  Administered 2021-12-02 (×2): 25 ug via INTRAVENOUS

## 2021-12-02 MED ORDER — ONDANSETRON HCL 4 MG/2ML IJ SOLN
INTRAMUSCULAR | Status: AC
  Filled 2021-12-02: qty 2

## 2021-12-02 MED ORDER — 0.9 % SODIUM CHLORIDE (POUR BTL) OPTIME
TOPICAL | Status: DC | PRN
  Administered 2021-12-02: 1000 mL

## 2021-12-02 MED ORDER — EPHEDRINE SULFATE-NACL 50-0.9 MG/10ML-% IV SOSY
PREFILLED_SYRINGE | INTRAVENOUS | Status: DC | PRN
  Administered 2021-12-02: 10 mg via INTRAVENOUS

## 2021-12-02 MED ORDER — CHLORHEXIDINE GLUCONATE 0.12 % MT SOLN
15.0000 mL | Freq: Once | OROMUCOSAL | Status: AC
  Administered 2021-12-02: 15 mL via OROMUCOSAL

## 2021-12-02 MED ORDER — PHENYLEPHRINE 40 MCG/ML (10ML) SYRINGE FOR IV PUSH (FOR BLOOD PRESSURE SUPPORT)
PREFILLED_SYRINGE | INTRAVENOUS | Status: AC
  Filled 2021-12-02: qty 10

## 2021-12-02 SURGICAL SUPPLY — 17 items
BAG DRN RND TRDRP ANRFLXCHMBR (UROLOGICAL SUPPLIES) ×1
BAG URINE DRAIN 2000ML AR STRL (UROLOGICAL SUPPLIES) ×1 IMPLANT
BAG URO CATCHER STRL LF (MISCELLANEOUS) ×3 IMPLANT
CATH TIEMANN FOLEY 18FR 5CC (CATHETERS) ×1 IMPLANT
CLOTH BEACON ORANGE TIMEOUT ST (SAFETY) ×3 IMPLANT
DRAPE FOOT SWITCH (DRAPES) ×3 IMPLANT
ELECT REM PT RETURN 15FT ADLT (MISCELLANEOUS) ×3 IMPLANT
GLOVE SURG ENC MOIS LTX SZ6.5 (GLOVE) ×3 IMPLANT
GOWN STRL REUS W/TWL LRG LVL3 (GOWN DISPOSABLE) ×3 IMPLANT
KIT TURNOVER KIT A (KITS) IMPLANT
LOOP CUT BIPOLAR 24F LRG (ELECTROSURGICAL) ×1 IMPLANT
MANIFOLD NEPTUNE II (INSTRUMENTS) ×3 IMPLANT
PACK CYSTO (CUSTOM PROCEDURE TRAY) ×3 IMPLANT
SYR TOOMEY IRRIG 70ML (MISCELLANEOUS) ×2
SYRINGE TOOMEY IRRIG 70ML (MISCELLANEOUS) IMPLANT
TUBING CONNECTING 10 (TUBING) ×3 IMPLANT
TUBING UROLOGY SET (TUBING) ×3 IMPLANT

## 2021-12-02 NOTE — Anesthesia Preprocedure Evaluation (Addendum)
Anesthesia Evaluation  Patient identified by MRN, date of birth, ID band Patient awake    Reviewed: Allergy & Precautions, NPO status , Patient's Chart, lab work & pertinent test results  Airway Mallampati: III  TM Distance: >3 FB Neck ROM: Full    Dental no notable dental hx. (+)    Pulmonary sleep apnea and Continuous Positive Airway Pressure Ventilation ,    Pulmonary exam normal breath sounds clear to auscultation       Cardiovascular hypertension, Pt. on medications Normal cardiovascular exam Rhythm:Regular Rate:Normal  TTE 2021 1. Left ventricular ejection fraction, by estimation, is 55 to 60%. The  left ventricle has normal function. The left ventricle has no regional  wall motion abnormalities. There is mild concentric left ventricular  hypertrophy. Left ventricular diastolic  parameters were normal.  2. Right ventricular systolic function is normal. The right ventricular  size is normal.  3. Left atrial size was mildly dilated.  4. The mitral valve is normal in structure. Trivial mitral valve  regurgitation. No evidence of mitral stenosis.  5. The aortic valve is tricuspid. Aortic valve regurgitation is mild.  Mild aortic valve sclerosis is present, with no evidence of aortic valve  stenosis.  6. There is mild dilatation of the ascending aorta measuring 39 mm.  7. The inferior vena cava is normal in size with greater than 50%  respiratory variability, suggesting right atrial pressure of 3 mmHg.   Stress Test 2021 Blood pressure demonstrated a hypertensive response to exercise. Upsloping ST segment depression ST segment depression was noted during stress in the III, II, aVF, V6, V5 and V4 leads. Negative, adequate stress test.    Neuro/Psych negative neurological ROS  negative psych ROS   GI/Hepatic Neg liver ROS, GERD  Controlled,  Endo/Other  Hypothyroidism   Renal/GU Renal diseaseLab Results       Component                Value               Date                      CREATININE               2.78 (H)            12/02/2021                BUN                      38 (H)              12/02/2021                NA                       138                 12/02/2021                K                        4.0                 12/02/2021                CL  111                 12/02/2021                CO2                      20 (L)              12/02/2021              negative genitourinary   Musculoskeletal  (+) Arthritis ,   Abdominal   Peds  Hematology  (+) Blood dyscrasia (Hgb 10.8), anemia ,   Anesthesia Other Findings   Reproductive/Obstetrics                            Anesthesia Physical Anesthesia Plan  ASA: 3  Anesthesia Plan: General   Post-op Pain Management: Tylenol PO (pre-op)   Induction: Intravenous  PONV Risk Score and Plan: 2 and Ondansetron, Dexamethasone and Treatment may vary due to age or medical condition  Airway Management Planned: LMA  Additional Equipment:   Intra-op Plan:   Post-operative Plan: Extubation in OR  Informed Consent: I have reviewed the patients History and Physical, chart, labs and discussed the procedure including the risks, benefits and alternatives for the proposed anesthesia with the patient or authorized representative who has indicated his/her understanding and acceptance.     Dental advisory given  Plan Discussed with: CRNA  Anesthesia Plan Comments:        Anesthesia Quick Evaluation

## 2021-12-02 NOTE — Transfer of Care (Signed)
Immediate Anesthesia Transfer of Care Note  Patient: [name] [name]  Procedure(s) Performed: TRANSURETHRAL RESECTION OF BLADDER TUMOR (TURBT)  Patient Location: PACU  Anesthesia Type:General  Level of Consciousness: drowsy and patient cooperative  Airway & Oxygen Therapy: Patient Spontanous Breathing and Patient connected to face mask oxygen  Post-op Assessment: Report given to RN and Post -op Vital signs reviewed and stable  Post vital signs: Reviewed and stable  Last Vitals:  Vitals Value Taken Time  BP 108/56 12/02/21 0957  Temp    Pulse 79 12/02/21 0957  Resp 10 12/02/21 0957  SpO2 99 % 12/02/21 0957  Vitals shown include unvalidated device data.  Last Pain:  Vitals:   12/02/21 0729  TempSrc:   PainSc: 0-No pain      Patients Stated Pain Goal: 3 (16/10/96 0454)  Complications: No notable events documented.

## 2021-12-02 NOTE — Discharge Instructions (Signed)

## 2021-12-02 NOTE — Anesthesia Procedure Notes (Signed)
Procedure Name: LMA Insertion Date/Time: 12/02/2021 9:13 AM Performed by: West Pugh, CRNA Pre-anesthesia Checklist: Patient identified, Emergency Drugs available, Suction available, Patient being monitored and Timeout performed Patient Re-evaluated:Patient Re-evaluated prior to induction Oxygen Delivery Method: Circle system utilized Preoxygenation: Pre-oxygenation with 100% oxygen Induction Type: IV induction LMA: LMA inserted LMA Size: 4.0 Number of attempts: 1 Placement Confirmation: positive ETCO2 Tube secured with: Tape Dental Injury: Teeth and Oropharynx as per pre-operative assessment

## 2021-12-02 NOTE — Anesthesia Postprocedure Evaluation (Signed)
Anesthesia Post Note  Patient: [name] [name]  Procedure(s) Performed: TRANSURETHRAL RESECTION OF BLADDER TUMOR (TURBT) (Bladder)     Patient location during evaluation: PACU Anesthesia Type: General Level of consciousness: awake and alert Pain management: pain level controlled Vital Signs Assessment: post-procedure vital signs reviewed and stable Respiratory status: spontaneous breathing, nonlabored ventilation and respiratory function stable Cardiovascular status: blood pressure returned to baseline and stable Postop Assessment: no apparent nausea or vomiting Anesthetic complications: no   No notable events documented.  Last Vitals:  Vitals:   12/02/21 1015 12/02/21 1030  BP: 120/62 120/64  Pulse: 76 73  Resp: 12 14  Temp:  36.6 C  SpO2: 96% 96%    Last Pain:  Vitals:   12/02/21 1030  TempSrc:   PainSc: 3                  Charlottie Peragine,W. EDMOND

## 2021-12-02 NOTE — Interval H&P Note (Signed)
History and Physical Interval Note:  12/02/2021 8:04 AM  [name] [name]  has presented today for surgery, with the diagnosis of BLADDER CANCER.  The various methods of treatment have been discussed with the patient and family. After consideration of risks, benefits and other options for treatment, the patient has consented to  Procedure(s): TRANSURETHRAL RESECTION OF BLADDER TUMOR (TURBT) (N/A) as a surgical intervention.  The patient's history has been reviewed, patient examined, no change in status, stable for surgery.  I have reviewed the patient's chart and labs.  Questions were answered to the patient's satisfaction.     Alyson Ki D Simcha Farrington

## 2021-12-02 NOTE — Op Note (Signed)
PATIENT:  [name] [name]  PRE-OPERATIVE DIAGNOSIS: Bladder tumor  POST-OPERATIVE DIAGNOSIS: Same  PROCEDURE:  Procedure(s): 1. Transurethral resection of bladder tumor (small < 2 cm)  SURGEON:  Jacalyn Lefevre, MD  ANESTHESIA:   General  EBL:  Minimal  DRAINS: Urethral catheter (18 Fr. Coude Foley)   SPECIMEN:  Bladder neck bladder tumor  DISPOSITION OF SPECIMEN:  PATHOLOGY  FINDINGS: Normal anterior urethra Prostatic urethra with evidence of prior TUR and wide open bladder neck Ureteral orifices have been resected bilaterally and are displaced laterally Well-healed prior TUR scar posterior wall with some slightly erythematous mucosa surrounding Small 0.5 cm papillary bladder mass seen at left bladder neck/prostatic urethra  Indication: 88 year old man with a history of high-grade TA bladder cancer with persistent gross hematuria and erythematous mucosa seen on cystoscopy concerning for early recurrence.  Description of operation: The patient was taken to the operating room and administered general anesthesia. They were then placed on the table and moved to the dorsal lithotomy position after which the genitalia was sterilely prepped and draped. An official timeout was then performed.  The 21 French resectoscope with the 30 lens and visual obturator were then passed into the bladder under direct visualization. Urethra appeared normal. The visual obturator was then removed and the Gyrus resectoscope element with 30  lens was then inserted and the bladder was fully and systematically inspected.   I first began by inspecting bilateral ureteral orifices and noting clear efflux from each side.  Next the posterior bladder mucosa was examined and no papillary growths was seen however it was slightly erythematous and friable.  The bipolar loop was then used to cauterize this area.  Next examination of the bladder revealed papillary early growth at the left anterior bladder neck and  further in the prostatic urethra a small papillary bladder mass.  The bipolar loop was used to resect this and then cauterized any residual tissue.  Due to prior TUR and aggressive biopsy could not be obtained.  Patient has a history of Ta disease and this did appear consistent with recurrence to support this decision.  Reinspection of the bladder revealed all obvious tumor had been fully resected and there was no evidence of perforation. The Toomey syringe was then used to irrigate the bladder and remove all of the portions of bladder tumor which were sent to pathology. I then removed the resectoscope.  A 18 French Foley catheter was then inserted in the bladder and irrigated. The irrigant returned slightly pink with no clots. The patient was awakened and taken to the recovery room.   PLAN OF CARE: Patient to be discharged home with the Foley catheter in place.  He will be contacted for Foley removal in the office.  PATIENT DISPOSITION:  PACU - hemodynamically stable.

## 2021-12-03 ENCOUNTER — Encounter (HOSPITAL_COMMUNITY): Payer: Self-pay | Admitting: Urology

## 2021-12-03 LAB — SURGICAL PATHOLOGY

## 2021-12-08 DIAGNOSIS — C678 Malignant neoplasm of overlapping sites of bladder: Secondary | ICD-10-CM | POA: Diagnosis not present

## 2021-12-08 DIAGNOSIS — R3915 Urgency of urination: Secondary | ICD-10-CM | POA: Diagnosis not present

## 2021-12-23 DIAGNOSIS — I129 Hypertensive chronic kidney disease with stage 1 through stage 4 chronic kidney disease, or unspecified chronic kidney disease: Secondary | ICD-10-CM | POA: Diagnosis not present

## 2021-12-23 DIAGNOSIS — E782 Mixed hyperlipidemia: Secondary | ICD-10-CM | POA: Diagnosis not present

## 2021-12-23 DIAGNOSIS — N1832 Chronic kidney disease, stage 3b: Secondary | ICD-10-CM | POA: Diagnosis not present

## 2021-12-23 DIAGNOSIS — E039 Hypothyroidism, unspecified: Secondary | ICD-10-CM | POA: Diagnosis not present

## 2021-12-29 DIAGNOSIS — N184 Chronic kidney disease, stage 4 (severe): Secondary | ICD-10-CM | POA: Diagnosis not present

## 2021-12-31 DIAGNOSIS — E782 Mixed hyperlipidemia: Secondary | ICD-10-CM | POA: Diagnosis not present

## 2021-12-31 DIAGNOSIS — I129 Hypertensive chronic kidney disease with stage 1 through stage 4 chronic kidney disease, or unspecified chronic kidney disease: Secondary | ICD-10-CM | POA: Diagnosis not present

## 2021-12-31 DIAGNOSIS — I10 Essential (primary) hypertension: Secondary | ICD-10-CM | POA: Diagnosis not present

## 2021-12-31 DIAGNOSIS — N184 Chronic kidney disease, stage 4 (severe): Secondary | ICD-10-CM | POA: Diagnosis not present

## 2022-01-07 DIAGNOSIS — R600 Localized edema: Secondary | ICD-10-CM | POA: Diagnosis not present

## 2022-01-07 DIAGNOSIS — N1832 Chronic kidney disease, stage 3b: Secondary | ICD-10-CM | POA: Diagnosis not present

## 2022-01-07 DIAGNOSIS — R7309 Other abnormal glucose: Secondary | ICD-10-CM | POA: Diagnosis not present

## 2022-01-07 DIAGNOSIS — E782 Mixed hyperlipidemia: Secondary | ICD-10-CM | POA: Diagnosis not present

## 2022-01-07 DIAGNOSIS — I10 Essential (primary) hypertension: Secondary | ICD-10-CM | POA: Diagnosis not present

## 2022-01-07 DIAGNOSIS — E039 Hypothyroidism, unspecified: Secondary | ICD-10-CM | POA: Diagnosis not present

## 2022-01-07 DIAGNOSIS — N184 Chronic kidney disease, stage 4 (severe): Secondary | ICD-10-CM | POA: Diagnosis not present

## 2022-01-07 DIAGNOSIS — I129 Hypertensive chronic kidney disease with stage 1 through stage 4 chronic kidney disease, or unspecified chronic kidney disease: Secondary | ICD-10-CM | POA: Diagnosis not present

## 2022-01-07 DIAGNOSIS — I700 Atherosclerosis of aorta: Secondary | ICD-10-CM | POA: Diagnosis not present

## 2022-01-09 DIAGNOSIS — D509 Iron deficiency anemia, unspecified: Secondary | ICD-10-CM | POA: Diagnosis not present

## 2022-01-09 DIAGNOSIS — N184 Chronic kidney disease, stage 4 (severe): Secondary | ICD-10-CM | POA: Diagnosis not present

## 2022-01-09 DIAGNOSIS — I129 Hypertensive chronic kidney disease with stage 1 through stage 4 chronic kidney disease, or unspecified chronic kidney disease: Secondary | ICD-10-CM | POA: Diagnosis not present

## 2022-01-09 DIAGNOSIS — E875 Hyperkalemia: Secondary | ICD-10-CM | POA: Diagnosis not present

## 2022-01-09 DIAGNOSIS — C679 Malignant neoplasm of bladder, unspecified: Secondary | ICD-10-CM | POA: Diagnosis not present

## 2022-01-09 DIAGNOSIS — R809 Proteinuria, unspecified: Secondary | ICD-10-CM | POA: Diagnosis not present

## 2022-01-09 DIAGNOSIS — R319 Hematuria, unspecified: Secondary | ICD-10-CM | POA: Diagnosis not present

## 2022-01-09 DIAGNOSIS — N2581 Secondary hyperparathyroidism of renal origin: Secondary | ICD-10-CM | POA: Diagnosis not present

## 2022-01-12 DIAGNOSIS — G4737 Central sleep apnea in conditions classified elsewhere: Secondary | ICD-10-CM | POA: Diagnosis not present

## 2022-01-12 DIAGNOSIS — G4733 Obstructive sleep apnea (adult) (pediatric): Secondary | ICD-10-CM | POA: Diagnosis not present

## 2022-02-20 DIAGNOSIS — N1832 Chronic kidney disease, stage 3b: Secondary | ICD-10-CM | POA: Diagnosis not present

## 2022-02-20 DIAGNOSIS — E039 Hypothyroidism, unspecified: Secondary | ICD-10-CM | POA: Diagnosis not present

## 2022-02-20 DIAGNOSIS — I129 Hypertensive chronic kidney disease with stage 1 through stage 4 chronic kidney disease, or unspecified chronic kidney disease: Secondary | ICD-10-CM | POA: Diagnosis not present

## 2022-02-20 DIAGNOSIS — E782 Mixed hyperlipidemia: Secondary | ICD-10-CM | POA: Diagnosis not present

## 2022-03-11 DIAGNOSIS — R310 Gross hematuria: Secondary | ICD-10-CM | POA: Diagnosis not present

## 2022-03-11 DIAGNOSIS — C678 Malignant neoplasm of overlapping sites of bladder: Secondary | ICD-10-CM | POA: Diagnosis not present

## 2022-03-16 ENCOUNTER — Other Ambulatory Visit: Payer: Self-pay | Admitting: Urology

## 2022-03-17 ENCOUNTER — Other Ambulatory Visit: Payer: Self-pay | Admitting: Urology

## 2022-03-17 DIAGNOSIS — C678 Malignant neoplasm of overlapping sites of bladder: Secondary | ICD-10-CM | POA: Diagnosis not present

## 2022-03-17 DIAGNOSIS — R319 Hematuria, unspecified: Secondary | ICD-10-CM | POA: Diagnosis not present

## 2022-03-17 DIAGNOSIS — R310 Gross hematuria: Secondary | ICD-10-CM | POA: Diagnosis not present

## 2022-03-17 DIAGNOSIS — N1330 Unspecified hydronephrosis: Secondary | ICD-10-CM | POA: Diagnosis not present

## 2022-03-20 NOTE — Patient Instructions (Signed)
DUE TO COVID-19 ONLY TWO VISITORS  (aged 86 and older)  ARE ALLOWED TO COME WITH YOU AND STAY IN THE WAITING ROOM ONLY DURING PRE OP AND PROCEDURE.   ?**NO VISITORS ARE ALLOWED IN THE SHORT STAY AREA OR RECOVERY ROOM!!** ? ?  ? ? Your procedure is scheduled on: 03-24-22 ? ? Report to [facility] Main Entrance ? ?  Report to admitting at     1:10 PM ? ? Call this number if you have problems the morning of surgery [phone] ? ? Do not eat food :After Midnight. ? ? After Midnight you may have the following liquids until ______ [place] BY MOUTH ? ?Water ?Black Coffee (sugar ok, NO MILK/CREAM OR CREAMERS)  ?Tea (sugar ok, NO MILK/CREAM OR CREAMERS) regular and decaf                             ?Plain Jell-O (NO RED)                                           ?Fruit ices (not with fruit pulp, NO RED)                                     ?Popsicles (NO RED)                                                                  ?Juice: apple, WHITE grape, WHITE cranberry ?Sports drinks like Gatorade (NO RED) ?Clear broth(vegetable,chicken,beef) ? ?       ? ?FOLLOW  ANY ADDITIONAL PRE OP INSTRUCTIONS YOU RECEIVED FROM YOUR SURGEON'S OFFICE!!! ?  ?  ?Oral Hygiene is also important to reduce your risk of infection.                                    ?Remember - BRUSH YOUR TEETH THE MORNING OF SURGERY WITH YOUR REGULAR TOOTHPASTE ? ? Do NOT smoke after Midnight ? ? Take these medicines the morning of surgery with A SIP OF WATER: levothyroxine, amlodipine, tylenol if needed ? ? ? ?Bring CPAP mask and tubing day of surgery. ?                  ?           You may not have any metal on your body including hair pins, jewelry, and body piercing ? ?           Do not wear  lotions, powders, perfumes/cologne, or deodorant ? ? ?            Men may shave face and neck. ? ? Do not bring valuables to the hospital. [facility] NOT ?            RESPONSIBLE   FOR VALUABLES. ? ? Contacts, dentures or bridgework  may not be worn into surgery. ? ? Bring small overnight bag day  of surgery. ?  ? Patients discharged on the day of surgery will not be allowed to drive home.  Someone NEEDS to stay with you for the first 24 hours after anesthesia. ? ? Special Instructions: Bring a copy of your healthcare power of attorney and living will documents         the day of surgery if you haven't scanned them before. ? ?            Please read over the following fact sheets you were given: IF [place] [phone] ? ?   [facility] - Preparing for Surgery ?Before surgery, you can play an important role.  Because skin is not sterile, your skin needs to be as free of germs as possible.  You can reduce the number of germs on your skin by washing with CHG (chlorahexidine gluconate) soap before surgery.  CHG is an antiseptic cleaner which kills germs and bonds with the skin to continue killing germs even after washing. ?Please DO NOT use if you have an allergy to CHG or antibacterial soaps.  If your skin becomes reddened/irritated stop using the CHG and inform your nurse when you arrive at Short Stay. ?Do not shave (including legs and underarms) for at least 48 hours prior to the first CHG shower.  You may shave your face/neck. ?Please follow these instructions carefully: ? 1.  Shower with CHG Soap the night before surgery and the  morning of Surgery. ? 2.  If you choose to wash your hair, wash your hair first as usual with your  normal  shampoo. ? 3.  After you shampoo, rinse your hair and body thoroughly to remove the  shampoo.                           4.  Use CHG as you would any other liquid soap.  You can apply chg directly  to the skin and wash  ?                     Gently with a scrungie or clean washcloth. ? 5.  Apply the CHG Soap to your body ONLY FROM THE NECK DOWN.   Do not use on face/ open      ?                     Wound or open sores. Avoid contact with eyes, ears mouth and  genitals (private parts).  ?                     Production manager,  Genitals (private parts) with your normal soap. ?            6.  Wash thoroughly, paying special attention to the area where your surgery  will be performed. ? 7.  Thoroughly rinse your body with warm water from the neck down. ? 8.  DO NOT shower/wash with your normal soap after using and rinsing off  the CHG Soap. ?               9.  Pat yourself dry with a clean towel. ?           10.  Wear clean pajamas. ?           11.  Place clean sheets on your bed the night of your first shower and do  not  sleep with pets. ?Day of Surgery : ?Do not apply any lotions/deodorants the morning of surgery.  Please wear clean clothes to the hospital/surgery center. ? ?FAILURE TO FOLLOW THESE INSTRUCTIONS MAY RESULT IN THE CANCELLATION OF YOUR SURGERY ?PATIENT SIGNATURE_________________________________ ? ?NURSE SIGNATURE__________________________________ ? ?________________________________________________________________________  ?

## 2022-03-20 NOTE — H&P (Signed)
CC/HPI: cc: bladder cancer  ? ?8/22/: "[name] [name]" is an 88 yo man with hx of high-grade Ta bladder cancer diagnosed in September 2018. Repeat TURBT on 06/25/2020 showed low-grade Ta. He underwent cystoscopy with fulguration. He also has urinary frequency and urgency that has responded well to Myrbetriq. He has completed a 2nd induction BCG. He was doing well initially after the surgery however has had worsening urinary frequency, urgency and painful urination. Cysto with fulguration 10/29/20. Repeat surveillance cystoscopy 2 weeks ago showed very early regrowth consistent with recurrence. He underwent cystoscopy with fulguration on 07/08/2021. He has had some hematuria since then and urinary frequency. He denies worsening urinary urgency urge incontinence.  ? ?07/30/2021: 88 year old male who who presents today for follow-up after undergoing cystoscopy with fulguration on 07/08/2021. He continued to have hematuria and was seen on 07/14/2021. He was continued on Myrbetriq as well as encouraged to increase his hydration. He presents today for a PVR and re-evaluation of symptoms. Hematuria continues, however, it is getting lighter. He does have some dysuria ocassionally as well. He is emptying his bladder and has a PVR of 51 mls. He is still have some frequency.  ? ?09/18/21: Here for surveillance cystoscopy. Patient's last cystoscopy with fulguration was 07/08/2021. He continues to have intermittent hematuria since then. He states it has been improving and he will have periods where it will be no blood and just when he thinks it is gone he will have some. He strained himself more yesterday and then noted blood today. He has passed 1 clot.  ? ?21-Nov-2021: Here for surveillance cysto. He's continued to have intermittent gross hematuria. No dysuria.  ? ?12/08/21: 88 year old man with hx of recurrent high-grade Ta bladder cancer diagnosed September 2018. Most recent recurrence noted on last cystoscopy in December 2022 when he  underwent small TURBT on 12/02/2021. He is here today for void trial. I called him last week to discuss pathology of papilloma. This was noted to be just inside left bladder neck that was not well seen on flexible cystoscopy.  ? ?03/11/2022: 88 year old man with hx of recurrent high-grade Ta bladder cancer diagnosed September 2018. Most recent recurrence noted on last cystoscopy in December 2022 when he underwent small TURBT on 12/02/2021 and pathology showed papilloma. He is here today for surveillance cystoscopy. He continues to have gross hematuria however has significantly decreased after he decided not to be as physically active. He notes immediate gross hematuria at the beginning of stream but it typically subsides over the course the day. No flank pain or abdominal pain. He has had slight increased urgency with leakage. Nocturia x2.  ? ?  ?ALLERGIES: Cipro TABS - Diarrhea, **Diarrhea if taking '750mg'$ , '500mg'$  ok to take** ?Macrobid - Respiratory Distress ?  ? ?MEDICATIONS: Finasteride 5 mg tablet 1 tablet PO Daily  ?Levothyroxine Sodium 50 mcg tablet  ?Myrbetriq 50 mg tablet, extended release 24 hr 1 tablet PO Daily  ?Amlodipine Besylate 10 mg tablet tablet  ?Aspirin Ec 81 mg tablet, delayed release  ?Ferrous Fumarate  ?Irbesartan 300 mg tablet  ?Metamucil  ?Metamucil  ?Pantoprazole Sodium 40 mg tablet, delayed release  ?Rosuvastatin Calcium 5 mg tablet  ?Silodosin 8 mg capsule  ?Vitamin D3  ?Zinc 30 mg tablet  ?  ? ?GU PSH: Bladder Instill AntiCA Agent - 02/04/2021, 01/28/2021, 01/21/2021, 09/05/2020, 08/15/2020, 2021, 2021, 2021, 2021, 2021, 2021, 2019, 2018, 2018, 2018, 2018, 2018 ?Cysto Bladder Ureth Biopsy - 06/25/2020 ?Cysto Dilate Stricture (M or F) ?Cystoscopy - 21-Nov-2021, 09/18/2021, 06/23/2021, 05/22/2021,  02/17/2021, 10/14/2020, 05/16/2020, 11/09/2019, 2020, 2020, 2020, 2019, 2019, 2019, 2019, 2018 ?Cystoscopy Insert Stent, Right - 2018 ?Cystoscopy TURBT <2 cm - 12/02/2021 ?Cystoscopy TURBT >5 cm - 2021,  2018 ?Cystoscopy TURBT 2-5 cm - 07/08/2021, 10/29/2020, 2018 ?Cystoscopy TURP ?Locm 300-'399Mg'$ /Ml Iodine,1Ml - 2018 ?Remove Prostate Regrowth - 2021 ? ?  ? ?NON-GU PSH: Cataract surgery - 2019 ?Knee Arthroscopy, Left ? ?  ? ?GU PMH: Bladder Cancer overlapping sites - 12/08/2021, - 11/13/2021, - 09/18/2021, - 07/30/2021, - 06/23/2021, - 02/27/2021, - 02/17/2021, - 02/04/2021, - 01/28/2021, - 01/21/2021, - 01/14/2021, - 12/27/2020, - 11/11/2020, - 09/11/2020, - 09/05/2020, - 08/15/2020, - 08/08/2020, - 2020, - 2020, - 2020, - 2019, - 2019, - 2019, - 2019, - 2018, - 2018 ?Urinary Urgency - 12/08/2021, - 05/22/2021, - 02/17/2021, - 11/11/2020 ?Gross hematuria - 09/18/2021, - 07/30/2021 (Stable), - 06/23/2021, - 06/23/2021, - 06/09/2021 (Stable), - 02/27/2021, Improved, - 2021, - 2021, - 2021, - 11/09/2019 (Improving, Chronic), Reassured at this time if urine is only light pink/clear cherry he does not need admitting for CBI. He will continue with ABX as Rx'd and proceed with cysto on 09/20/17 as scheduled. Instructed pt and family that if he develops gross hematuria with clots will need to go immediately to [facility] ER for evaluation. Culture urine. Will continue with current ABX. , - 2018, - 2018, - 2018, - 2018 ?Urinary Frequency - 07/30/2021, - 02/17/2021, - 10/14/2020 ?Bladder Cancer Posterior - 07/14/2021, - 06/09/2021, - 05/22/2021, Patient with possible bladder tumor recurrence on cystoscopy today. He has active oozing from posterior bladder wall in between 2 prior TUR sites with irregular bladder mucosa. Given his recent recurrence we discussed proceeding with cystoscopy, bladder biopsy and fulguration of abnormal tissue. Will also discussed with patient intravesical gemcitabine. Risks and benefits of the procedure discussed with the patient including but not limited to pain, bleeding, possible need for Foley catheter, injury to surrounding structures including bladder ureters and prostate, need for future treatment, infection., - 10/14/2020 ?Acute  Cystitis/UTI - 06/09/2021, - 02/27/2021, - 12/27/2020, - 11/11/2020, - 07/05/2020 ?BPH w/LUTS - 05/22/2021, - 02/17/2021, Patient remains on Rapaflo and finasteride however still struggling with urinary frequency. Cystoscopy shows wide open prosthetic urethra. PVR is less than 100 mL today in the office. We discussed a trial of Myrbetriq 25 mg daily. I did discuss that he needs to take his blood pressure while starting medication. He will let me know if he has any adverse side effects., - 10/14/2020, - 2020, - 2020, - 2020, - 2019, - 2019, - 2019, - 2019, - 2018, Benign prostatic hyperplasia with urinary obstruction, - 2014 ?Urinary Tract Inf, Unspec site - 01/01/2021 ?Urinary Retention - 2021 ?ED due to arterial insufficiency, Erectile dysfunction due to arterial insufficiency - 2014 ?Urinary Retention, Unspec, Incomplete bladder emptying - 2014 ?History of bladder cancer ?  ?   ?PMH Notes: .  ? ?NON-GU PMH: Arthritis ?GERD ?Hypercholesterolemia ?Hypertension ?Hypothyroidism ?  ? ?FAMILY HISTORY: Alzheimer's Disease - Mother ?Brain tumor - Mother ?Breast Cancer - Mother ?Cardiac Devices Pacemaker Present - Sister ?copd - Father ?Death of family member - Father, Mother ?Diabetes - Brother ?Hypertension - Runs In Family ?Kidney Stones - Son, Runs in Family  ?  Notes: 2 sons; 1 daughter  ? ?SOCIAL HISTORY: Marital Status: Widowed ?Preferred Language: [place]; Ethnicity: Not Hispanic Or Latino; Race: White ?Current Smoking Status: Patient has never smoked.  ? ?Tobacco Use Assessment Completed: Used Tobacco in last 30 days? ?Drinks 4 drinks per month.  ?Drinks  4+ caffeinated drinks per day. ?Patient's occupation is/was retired. ?  ? ?REVIEW OF SYSTEMS:    ?GU Review Male:   Patient denies frequent urination, hard to postpone urination, burning/ pain with urination, get up at night to urinate, leakage of urine, stream starts and stops, trouble starting your stream, have to strain to urinate , erection problems, and penile pain.   ?Gastrointestinal (Upper):   Patient denies nausea, vomiting, and indigestion/ heartburn.  ?Gastrointestinal (Lower):   Patient denies diarrhea and constipation.  ?Constitutional:   Patient denies fever, night sweats, weight l

## 2022-03-20 NOTE — Progress Notes (Addendum)
PCP - Dr. Felicie Morn ?Cardiologist - Buford Dresser, MD sees annually ? ?PPM/ICD -  ?Device Orders -  ?Rep Notified -  ? ?Chest x-ray - 04-11-21 ?EKG - 05-01-21 epic ?Stress Test - 2021 ?ECHO - 2021 ?Cardiac Cath -  ? ?Sleep Study -  ?CPAP - yes ? ?Fasting Blood Sugar -  ?Checks Blood Sugar _____ times a day ? ?Blood Thinner Instructions: ?Aspirin Instructions:81 mg stopped 1 week ago ? ?ERAS Protcol - ?PRE-SURGERY Ensure or G2-  ? ? ?COVID vaccine -x5 pfizer  ? ?Activity--Able to walk a flight of stairs without SOB Walks at least a mile a day everyday ? ?Anesthesia review: OSA CKDIV, HTN ? ?Patient denies shortness of breath, fever, cough and chest pain at PAT appointment ? ? ?All instructions explained to the patient, with a verbal understanding of the material. Patient agrees to go over the instructions while at home for a better understanding. Patient also instructed to self quarantine after being tested for COVID-19. The opportunity to ask questions was provided. ?  ?

## 2022-03-23 ENCOUNTER — Encounter (HOSPITAL_COMMUNITY): Payer: Self-pay

## 2022-03-23 ENCOUNTER — Encounter (HOSPITAL_COMMUNITY)
Admission: RE | Admit: 2022-03-23 | Discharge: 2022-03-23 | Disposition: A | Discharge status: Home / Self Care | Payer: Medicare Other | Source: Ambulatory Visit | Attending: Urology | Admitting: Urology

## 2022-03-23 ENCOUNTER — Other Ambulatory Visit: Payer: Self-pay

## 2022-03-23 DIAGNOSIS — Z01812 Encounter for preprocedural laboratory examination: Secondary | ICD-10-CM | POA: Insufficient documentation

## 2022-03-23 DIAGNOSIS — I10 Essential (primary) hypertension: Secondary | ICD-10-CM

## 2022-03-23 HISTORY — DX: Personal history of urinary calculi: Z87.442

## 2022-03-23 HISTORY — DX: Unspecified asthma, uncomplicated: J45.909

## 2022-03-23 LAB — CBC
HCT: 32.8 % — ABNORMAL LOW (ref 39.0–52.0)
Hemoglobin: 10.8 g/dL — ABNORMAL LOW (ref 13.0–17.0)
MCH: 29.6 pg (ref 26.0–34.0)
MCHC: 32.9 g/dL (ref 30.0–36.0)
MCV: 89.9 fL (ref 80.0–100.0)
Platelets: 364 10*3/uL (ref 150–400)
RBC: 3.65 MIL/uL — ABNORMAL LOW (ref 4.22–5.81)
RDW: 14.1 % (ref 11.5–15.5)
WBC: 7.9 10*3/uL (ref 4.0–10.5)
nRBC: 0 % (ref 0.0–0.2)

## 2022-03-23 LAB — BASIC METABOLIC PANEL
Anion gap: 8 (ref 5–15)
BUN: 63 mg/dL — ABNORMAL HIGH (ref 8–23)
CO2: 18 mmol/L — ABNORMAL LOW (ref 22–32)
Calcium: 8.8 mg/dL — ABNORMAL LOW (ref 8.9–10.3)
Chloride: 112 mmol/L — ABNORMAL HIGH (ref 98–111)
Creatinine, Ser: 3.42 mg/dL — ABNORMAL HIGH (ref 0.61–1.24)
GFR, Estimated: 17 mL/min — ABNORMAL LOW (ref >60)
Glucose, Bld: 105 mg/dL — ABNORMAL HIGH (ref 70–99)
Potassium: 4.3 mmol/L (ref 3.5–5.1)
Sodium: 138 mmol/L (ref 135–145)

## 2022-03-23 NOTE — Anesthesia Preprocedure Evaluation (Addendum)
Anesthesia Evaluation  ?Patient identified by MRN, date of birth, ID band ?Patient awake ? ? ? ?Reviewed: ?Allergy & Precautions, NPO status , Patient's Chart, lab work & pertinent test results ? ?Airway ?Mallampati: II ? ?TM Distance: >3 FB ?Neck ROM: Full ? ? ? Dental ? ?(+) Dental Advisory Given ?  ?Pulmonary ?asthma , sleep apnea and Continuous Positive Airway Pressure Ventilation ,  ?  ?Pulmonary exam normal ?breath sounds clear to auscultation ? ? ? ? ? ? Cardiovascular ?hypertension (150/70 in preop, normally 140s/60s per pt), Pt. on medications ?Normal cardiovascular exam+ Valvular Problems/Murmurs (mild AI) AI  ?Rhythm:Regular Rate:Normal ? ?Echo 2021 ?1. Left ventricular ejection fraction, by estimation, is 55 to 60%. The  ?left ventricle has normal function. The left ventricle has no regional  ?wall motion abnormalities. There is mild concentric left ventricular  ?hypertrophy. Left ventricular diastolic  ?parameters were normal.  ??2. Right ventricular systolic function is normal. The right ventricular  ?size is normal.  ??3. Left atrial size was mildly dilated.  ??4. The mitral valve is normal in structure. Trivial mitral valve  ?regurgitation. No evidence of mitral stenosis.  ??5. The aortic valve is tricuspid. Aortic valve regurgitation is mild.  ?Mild aortic valve sclerosis is present, with no evidence of aortic valve  ?stenosis.  ??6. There is mild dilatation of the ascending aorta measuring 39 mm.  ??7. The inferior vena cava is normal in size with greater than 50%  ?respiratory variability, suggesting right atrial pressure of 3 mmHg. ? ?Stress test 2021 ? Blood pressure demonstrated a hypertensive response to exercise. ? Upsloping ST segment depression ST segment depression was noted during stress in the III, II, aVF, V6, V5 and V4 leads. ? Negative, adequate stress test. ? ?  ?Neuro/Psych ?negative neurological ROS ? negative psych ROS  ? GI/Hepatic ?Neg liver  ROS, GERD  Controlled and Medicated,  ?Endo/Other  ?Hypothyroidism  ? Renal/GU ?Renal diseaseGross hematuria  ?CKD 3, cr 3.42 ?Hx bladder ca Bladder dysfunction ? ? ? ?  ?Musculoskeletal ? ?(+) Arthritis , Osteoarthritis,   ? Abdominal ?  ?Peds ? Hematology ? ?(+) Blood dyscrasia, anemia , Hb 10.8   ?Anesthesia Other Findings ?HOH ? Reproductive/Obstetrics ?negative OB ROS ? ?  ? ? ? ? ? ? ? ? ? ? ? ? ? ?  ?  ? ? ? ? ? ? ? ?Anesthesia Physical ?Anesthesia Plan ? ?ASA: 3 ? ?Anesthesia Plan: General  ? ?Post-op Pain Management: Tylenol PO (pre-op)*  ? ?Induction: Intravenous ? ?PONV Risk Score and Plan: 3 and Ondansetron, Dexamethasone and Treatment may vary due to age or medical condition ? ?Airway Management Planned: LMA ? ?Additional Equipment: None ? ?Intra-op Plan:  ? ?Post-operative Plan: Extubation in OR ? ?Informed Consent: I have reviewed the patients History and Physical, chart, labs and discussed the procedure including the risks, benefits and alternatives for the proposed anesthesia with the patient or authorized representative who has indicated his/her understanding and acceptance.  ? ? ? ?Dental advisory given ? ?Plan Discussed with: CRNA ? ?Anesthesia Plan Comments:   ? ? ? ? ? ?Anesthesia Quick Evaluation ? ?

## 2022-03-24 ENCOUNTER — Ambulatory Visit (HOSPITAL_BASED_OUTPATIENT_CLINIC_OR_DEPARTMENT_OTHER): Payer: Medicare Other | Admitting: Anesthesiology

## 2022-03-24 ENCOUNTER — Encounter (HOSPITAL_COMMUNITY)
Admission: RE | Disposition: A | Discharge status: Home / Self Care | Payer: Self-pay | Source: Home / Self Care | Attending: Urology

## 2022-03-24 ENCOUNTER — Ambulatory Visit: Payer: Medicare Other | Admitting: Dermatology

## 2022-03-24 ENCOUNTER — Ambulatory Visit (HOSPITAL_COMMUNITY)
Admission: RE | Admit: 2022-03-24 | Discharge: 2022-03-24 | Disposition: A | Discharge status: Home / Self Care | Payer: Medicare Other | Attending: Urology | Admitting: Urology

## 2022-03-24 ENCOUNTER — Ambulatory Visit (HOSPITAL_COMMUNITY): Payer: Medicare Other

## 2022-03-24 ENCOUNTER — Encounter (HOSPITAL_COMMUNITY): Payer: Self-pay | Admitting: Urology

## 2022-03-24 ENCOUNTER — Ambulatory Visit (HOSPITAL_COMMUNITY): Payer: Medicare Other | Admitting: Physician Assistant

## 2022-03-24 DIAGNOSIS — R310 Gross hematuria: Secondary | ICD-10-CM | POA: Insufficient documentation

## 2022-03-24 DIAGNOSIS — Z8551 Personal history of malignant neoplasm of bladder: Secondary | ICD-10-CM | POA: Insufficient documentation

## 2022-03-24 DIAGNOSIS — I129 Hypertensive chronic kidney disease with stage 1 through stage 4 chronic kidney disease, or unspecified chronic kidney disease: Secondary | ICD-10-CM | POA: Diagnosis not present

## 2022-03-24 DIAGNOSIS — I10 Essential (primary) hypertension: Secondary | ICD-10-CM | POA: Insufficient documentation

## 2022-03-24 DIAGNOSIS — R319 Hematuria, unspecified: Secondary | ICD-10-CM | POA: Diagnosis not present

## 2022-03-24 DIAGNOSIS — E039 Hypothyroidism, unspecified: Secondary | ICD-10-CM | POA: Diagnosis not present

## 2022-03-24 DIAGNOSIS — N183 Chronic kidney disease, stage 3 unspecified: Secondary | ICD-10-CM | POA: Diagnosis not present

## 2022-03-24 DIAGNOSIS — N2889 Other specified disorders of kidney and ureter: Secondary | ICD-10-CM | POA: Diagnosis not present

## 2022-03-24 DIAGNOSIS — R896 Abnormal cytological findings in specimens from other organs, systems and tissues: Secondary | ICD-10-CM | POA: Diagnosis not present

## 2022-03-24 HISTORY — PX: CYSTOSCOPY WITH FULGERATION: SHX6638

## 2022-03-24 HISTORY — PX: CYSTOSCOPY WITH RETROGRADE PYELOGRAM, URETEROSCOPY AND STENT PLACEMENT: SHX5789

## 2022-03-24 SURGERY — CYSTOURETEROSCOPY, WITH RETROGRADE PYELOGRAM AND STENT INSERTION
Anesthesia: General | Site: Urethra

## 2022-03-24 MED ORDER — OXYCODONE HCL 5 MG PO TABS
ORAL_TABLET | ORAL | Status: AC
  Administered 2022-03-24: 5 mg via ORAL
  Filled 2022-03-24: qty 1

## 2022-03-24 MED ORDER — ONDANSETRON HCL 4 MG/2ML IJ SOLN
INTRAMUSCULAR | Status: AC
  Filled 2022-03-24: qty 2

## 2022-03-24 MED ORDER — FENTANYL CITRATE (PF) 100 MCG/2ML IJ SOLN
INTRAMUSCULAR | Status: AC
Start: 2022-03-24 — End: ?
  Filled 2022-03-24: qty 2

## 2022-03-24 MED ORDER — PHENYLEPHRINE HCL (PRESSORS) 10 MG/ML IV SOLN
INTRAVENOUS | Status: AC
  Filled 2022-03-24: qty 1

## 2022-03-24 MED ORDER — IOHEXOL 300 MG/ML  SOLN
INTRAMUSCULAR | Status: DC | PRN
  Administered 2022-03-24: 20 mL via URETHRAL

## 2022-03-24 MED ORDER — ORAL CARE MOUTH RINSE: 15.0000 mL | Freq: Once | OROMUCOSAL | Status: AC

## 2022-03-24 MED ORDER — LIDOCAINE 2% (20 MG/ML) 5 ML SYRINGE
INTRAMUSCULAR | Status: DC | PRN
  Administered 2022-03-24: 60 mg via INTRAVENOUS

## 2022-03-24 MED ORDER — CHLORHEXIDINE GLUCONATE 0.12 % MT SOLN
15.0000 mL | Freq: Once | OROMUCOSAL | Status: AC
  Administered 2022-03-24: 15 mL via OROMUCOSAL

## 2022-03-24 MED ORDER — EPHEDRINE SULFATE-NACL 50-0.9 MG/10ML-% IV SOSY
PREFILLED_SYRINGE | INTRAVENOUS | Status: DC | PRN
  Administered 2022-03-24 (×2): 5 mg via INTRAVENOUS
  Administered 2022-03-24: 10 mg via INTRAVENOUS

## 2022-03-24 MED ORDER — SODIUM CHLORIDE 0.9 % IR SOLN
Status: DC | PRN
  Administered 2022-03-24: 1000 mL
  Administered 2022-03-24 (×2): 3000 mL via INTRAVESICAL

## 2022-03-24 MED ORDER — PHENYLEPHRINE 80 MCG/ML (10ML) SYRINGE FOR IV PUSH (FOR BLOOD PRESSURE SUPPORT)
PREFILLED_SYRINGE | INTRAVENOUS | Status: DC | PRN
  Administered 2022-03-24: 240 ug via INTRAVENOUS
  Administered 2022-03-24 (×3): 160 ug via INTRAVENOUS

## 2022-03-24 MED ORDER — LACTATED RINGERS IV SOLN: INTRAVENOUS | Status: DC

## 2022-03-24 MED ORDER — FENTANYL CITRATE PF 50 MCG/ML IJ SOSY
25.0000 ug | PREFILLED_SYRINGE | INTRAMUSCULAR | Status: DC | PRN
  Administered 2022-03-24: 25 ug via INTRAVENOUS

## 2022-03-24 MED ORDER — DEXAMETHASONE SODIUM PHOSPHATE 10 MG/ML IJ SOLN
INTRAMUSCULAR | Status: DC | PRN
  Administered 2022-03-24: 5 mg via INTRAVENOUS

## 2022-03-24 MED ORDER — ONDANSETRON HCL 4 MG/2ML IJ SOLN: 4.0000 mg | Freq: Once | INTRAMUSCULAR | Status: DC | PRN

## 2022-03-24 MED ORDER — LIDOCAINE HCL (PF) 2 % IJ SOLN
INTRAMUSCULAR | Status: AC
  Filled 2022-03-24: qty 5

## 2022-03-24 MED ORDER — ONDANSETRON HCL 4 MG/2ML IJ SOLN
INTRAMUSCULAR | Status: DC | PRN
  Administered 2022-03-24: 4 mg via INTRAVENOUS

## 2022-03-24 MED ORDER — ACETAMINOPHEN 500 MG PO TABS
1000.0000 mg | ORAL_TABLET | Freq: Once | ORAL | Status: AC
  Administered 2022-03-24: 1000 mg via ORAL
  Filled 2022-03-24: qty 2

## 2022-03-24 MED ORDER — CIPROFLOXACIN IN D5W 400 MG/200ML IV SOLN
400.0000 mg | Freq: Once | INTRAVENOUS | Status: AC
  Administered 2022-03-24: 400 mg via INTRAVENOUS
  Filled 2022-03-24: qty 200

## 2022-03-24 MED ORDER — FENTANYL CITRATE PF 50 MCG/ML IJ SOSY
PREFILLED_SYRINGE | INTRAMUSCULAR | Status: AC
  Filled 2022-03-24: qty 2

## 2022-03-24 MED ORDER — PROPOFOL 10 MG/ML IV BOLUS
INTRAVENOUS | Status: DC | PRN
  Administered 2022-03-24 (×3): 30 mg via INTRAVENOUS
  Administered 2022-03-24: 100 mg via INTRAVENOUS
  Administered 2022-03-24: 50 mg via INTRAVENOUS

## 2022-03-24 MED ORDER — FENTANYL CITRATE (PF) 100 MCG/2ML IJ SOLN
INTRAMUSCULAR | Status: DC | PRN
  Administered 2022-03-24 (×3): 50 ug via INTRAVENOUS

## 2022-03-24 MED ORDER — OXYCODONE HCL 5 MG PO TABS: 5.0000 mg | ORAL_TABLET | Freq: Once | ORAL | Status: AC | PRN

## 2022-03-24 MED ORDER — OXYCODONE HCL 5 MG/5ML PO SOLN: 5.0000 mg | Freq: Once | ORAL | Status: AC | PRN

## 2022-03-24 MED ORDER — PROPOFOL 10 MG/ML IV BOLUS
INTRAVENOUS | Status: AC
  Filled 2022-03-24: qty 20

## 2022-03-24 MED ORDER — FENTANYL CITRATE (PF) 100 MCG/2ML IJ SOLN
INTRAMUSCULAR | Status: AC
  Filled 2022-03-24: qty 2

## 2022-03-24 MED ORDER — PHENYLEPHRINE HCL-NACL 20-0.9 MG/250ML-% IV SOLN
INTRAVENOUS | Status: DC | PRN
  Administered 2022-03-24: 50 ug/min via INTRAVENOUS

## 2022-03-24 SURGICAL SUPPLY — 39 items
BAG DRN RND TRDRP ANRFLXCHMBR (UROLOGICAL SUPPLIES)
BAG URINE DRAIN 2000ML AR STRL (UROLOGICAL SUPPLIES) IMPLANT
BAG URO CATCHER STRL LF (MISCELLANEOUS) ×6 IMPLANT
BASKET ZERO TIP NITINOL 2.4FR (BASKET) IMPLANT
BSKT STON RTRVL ZERO TP 2.4FR (BASKET)
CATH FOLEY 2WAY SLVR 30CC 22FR (CATHETERS) ×3 IMPLANT
CATH URETL OPEN 5X70 (CATHETERS) ×6 IMPLANT
CLOTH BEACON ORANGE TIMEOUT ST (SAFETY) ×3 IMPLANT
CNTNR URN SCR LID CUP LEK RST (MISCELLANEOUS) ×2 IMPLANT
CONT SPEC 4OZ STRL OR WHT (MISCELLANEOUS) ×5
DRAPE FOOT SWITCH (DRAPES) ×6 IMPLANT
DRSG TEGADERM 2-3/8X2-3/4 SM (GAUZE/BANDAGES/DRESSINGS) IMPLANT
DRSG TELFA 3X8 NADH (GAUZE/BANDAGES/DRESSINGS) ×5 IMPLANT
ELECT COAG BIPOLAR CYL 1.2MMM (ELECTROSURGICAL)
ELECT REM PT RETURN 15FT ADLT (MISCELLANEOUS) ×3 IMPLANT
ELECTRODE COAG BIPLR CYL 1.2MM (ELECTROSURGICAL) IMPLANT
EXTRACTOR STONE 1.7FRX115CM (UROLOGICAL SUPPLIES) IMPLANT
GLOVE BIO SURGEON STRL SZ 6.5 (GLOVE) ×6 IMPLANT
GLOVE SURG LX 7.5 STRW (GLOVE) ×1
GLOVE SURG LX STRL 7.5 STRW (GLOVE) ×5 IMPLANT
GOWN STRL REUS W/TWL LRG LVL3 (GOWN DISPOSABLE) ×6 IMPLANT
GUIDEWIRE STR DUAL SENSOR (WIRE) ×9 IMPLANT
KIT TURNOVER KIT A (KITS) ×3 IMPLANT
LASER FIB FLEXIVA PULSE ID 365 (Laser) ×3 IMPLANT
LOOP CUT BIPOLAR 24F LRG (ELECTROSURGICAL) ×3 IMPLANT
MANIFOLD NEPTUNE II (INSTRUMENTS) ×6 IMPLANT
NEEDLE HYPO 22GX1.5 SAFETY (NEEDLE) ×3 IMPLANT
PACK CYSTO (CUSTOM PROCEDURE TRAY) ×6 IMPLANT
PAD DRESSING TELFA 3X8 NADH (GAUZE/BANDAGES/DRESSINGS) IMPLANT
SHEATH NAVIGATOR HD 11/13X28 (SHEATH) ×3 IMPLANT
SHEATH NAVIGATOR HD 11/13X36 (SHEATH) IMPLANT
STENT URET 6FRX26 CONTOUR (STENTS) ×3 IMPLANT
SYR 10ML LL (SYRINGE) ×3 IMPLANT
SYR TOOMEY IRRIG 70ML (MISCELLANEOUS) ×5
SYRINGE TOOMEY IRRIG 70ML (MISCELLANEOUS) ×2 IMPLANT
TRACTIP FLEXIVA PULS ID 200XHI (Laser) ×3 IMPLANT
TRACTIP FLEXIVA PULSE ID 200 (Laser)
TUBING CONNECTING 10 (TUBING) ×6 IMPLANT
TUBING UROLOGY SET (TUBING) ×6 IMPLANT

## 2022-03-24 NOTE — Anesthesia Procedure Notes (Signed)
Procedure Name: LMA Insertion ?Date/Time: 03/24/2022 2:32 PM ?Performed by: Rosaland Lao, CRNA ?Pre-anesthesia Checklist: Patient identified, Emergency Drugs available, Suction available and Patient being monitored ?Patient Re-evaluated:Patient Re-evaluated prior to induction ?Oxygen Delivery Method: Circle system utilized ?Preoxygenation: Pre-oxygenation with 100% oxygen ?Induction Type: IV induction ?Ventilation: Mask ventilation without difficulty ?LMA: LMA inserted ?LMA Size: 4.0 ?Tube type: Oral ?Number of attempts: 1 ?Airway Equipment and Method: Stylet ?Placement Confirmation: positive ETCO2 and breath sounds checked- equal and bilateral ?Tube secured with: Tape ?Dental Injury: Teeth and Oropharynx as per pre-operative assessment  ? ? ? ? ?

## 2022-03-24 NOTE — Transfer of Care (Signed)
Immediate Anesthesia Transfer of Care Note ? ?Patient: [name] [name] ? ?Procedure(s) Performed: CYSTOSCOPY WITH RETROGRADE PYELOGRAM, URETEROSCOPY AND STENT PLACEMENT (Left: Urethra) ?HOLMIUM LASER APPLICATION (Left: Urethra) ?CYSTOSCOPY WITH BIOPSY (Urethra) ?CYSTOSCOPY WITH FULGERATION (Urethra) ? ?Patient Location: PACU ? ?Anesthesia Type:General ? ?Level of Consciousness: awake, alert  and oriented ? ?Airway & Oxygen Therapy: Patient Spontanous Breathing and Patient connected to face mask ? ?Post-op Assessment: Report given to RN and Post -op Vital signs reviewed and stable ? ?Post vital signs: Reviewed and stable ? ?Last Vitals:  ?Vitals Value Taken Time  ?BP 116/67 03/24/22 1532  ?Temp    ?Pulse 76 03/24/22 1535  ?Resp 9 03/24/22 1535  ?SpO2 99 % 03/24/22 1535  ?Vitals shown include unvalidated device data. ? ?Last Pain:  ?Vitals:  ? 03/24/22 1349  ?TempSrc:   ?PainSc: 0-No pain  ?   ? ?  ? ?Complications: No notable events documented. ?

## 2022-03-24 NOTE — Op Note (Signed)
Operative Note ? ?Preoperative diagnosis:  ?1.  History of bladder cancer ?2.  Gross hematuria ? ?Postoperative diagnosis: ?1.  History of bladder cancer ?2.  Gross hematuria ? ?Procedure(s): ?1.  Diagnostic cystoscopy ?2.  Left retrograde pyelogram ?3.  Diagnostic ureteroscopy with biopsy ?4.  Ureteral washing ?5.  Fulguration of bladder mucosa ? ?Surgeon: Jacalyn Lefevre, MD ? ?Assistants:  None ? ?Anesthesia:  General ? ?Complications:  None ? ?EBL: Minimal ? ?Specimens: ?1. Left ureteral washing ?2. Left ureteral biopsy ? ? ?Drains/Catheters: ?1.  6Fr x 26 cm left JJ stent without string ? ?Intraoperative findings:   ?Normal anterior urethra ?Evidence of prior TUR with wide open bladder neck ?Well-healed multiple TUR scars and bladder mucosa; no bladder mass visible; slight friable bladder mucosa posterior wall ?Retrograde pyelogram on the left shows transition point with dilation of ureter proximal to this area in the mid to proximal ureter ?Previously resected ureteral orifices; right UO effluxing clear urine, left UO effluxing bloody urine ? ?Indication:  [name] [name] is a 88 y.o. male with history of recurrent high-grade Ta bladder cancer diagnosed in September 2018.  He has been having intermittent painless gross hematuria over the last several months.  On recent surveillance cystoscopy he had bloody E flux from the left side.  Subsequent CT of abdomen pelvis show dilation of left collecting system with blood products.  Patient also had worsening renal function. ? ?Description of procedure: ? ?After risks and benefits of the procedure discussed with the patient, informed consent was obtained.  The patient taken the operating room placed in supine position.  Anesthesia was induced and antibiotics were administered.  The patient was then repositioned in the dorsolithotomy position.  He was prepped and draped in the usual sterile fashion.  A timeout was performed. ? ?A 21 French rigid cystoscope was  placed in the urethral meatus and advanced in the bladder.  Findings noted above.  Bloody E flux was clearly seen coming from the left ureteral orifice.  An open ended ureteral catheter was then used to cannulate the ureter and ureteral washing was obtained.  Next a retrograde pyelogram was obtained which showed a dilated ureter proximal transition point.  A 0.38 sensor wire was then advanced through the open-ended ureteral catheter and the open-ended ureteral catheter was removed.  A semirigid ureteroscope was then advanced alongside the wire into the left ureter to the level of the transition point.  There was reduced visualization secondary to the hematuria. ? ?A second sensor wire was placed through the semirigid ureteroscope.  This still was unable to help advanced ureteroscope past the transition point.  The ureteroscope was removed.  A ureteral access sheath was then placed over the second wire and advanced to the mid ureter with fluoroscopic guidance.  The inner sheath and wire removed.  Flexible ureteroscopy then took place.  Again the narrowed transition point was seen.  There was no distinct mass seen however was unable to traverse this area.  Attempts at using biopsy forceps to biopsy the mucosa were made.  A small scant biopsy was sent to pathology. ? ?The ureteral access sheath was then removed and unison with the ureteroscope.  A 6 French by 26 cm double-J ureteral stent without a tether was then placed over the safety wire.  Fluoroscopic guidance confirmed a proximal curl in the kidney and a distal curl was seen under direct visualization. ? ? ?Next the resectoscope was assembled.  It was then advanced into the bladder with  the visual obturator.  Bipolar electrocautery fulguration took place at the posterior wall where some friable mucosa was noted. ? ?The resectoscope was removed and a 23 French Foley catheter was then placed without difficulty.  10 cc of sterile water were used and Fleet the  balloon.  The patient emerged from anesthesia and transferred the PACU in stable condition. ? ?Plan:   He will keep the Foley catheter for 3 to 5 days.  We will await cytology on ureteral washings and biopsy results.  We will need to discuss whether or not he would like to undergo a second procedure to evaluate the narrowed ureter. ?

## 2022-03-24 NOTE — Discharge Instructions (Addendum)
Post stent placement instructions ? ? ?Definitions: ? ?Ureter: The duct that transports urine from the kidney to the bladder. ?Stent: A plastic hollow tube that is placed into the ureter, from the kidney to the bladder to prevent the ureter from swelling shut. ? ?General instructions: ? ?Despite the fact that no skin incisions were used, the area around the ureter and bladder is raw and irritated. The stent is a foreign body which can further irritate the bladder wall. This irritation is manifested by increased frequency of urination, both day and night, and by an increase in the urge to urinate. In some, the urge to urinate is present almost always. Sometimes the urge is strong enough that you may not be able to stop your self from urinating. This can often be controlled with medication but does not occur in everyone. A stent can safely be left in place for 3 months or greater. ? ?You may see some blood in your urine while the stent is in place and a few days afterward. Do not be alarmed, even if the urine is clear for a while. Get off your feet and drink lots of fluids until clearing occurs. If you start to pass clots or don't improve, call us. ? ?Diet: ? ?You may return to your normal diet immediately. Because of the raw surface of your bladder, alcohol, spicy foods, foods high in acid and drinks with caffeine may cause irritation or frequency and should be used in moderation. To keep your urine flowing freely and avoid constipation, drink plenty of fluids during the day (8-10 glasses). Tip: Avoid cranberry juice because it is very acidic. ? ?Activity: ? ?Your physical activity doesn't need to be restricted. However, if you are very active, you may see some blood in the urine. We suggest that you reduce your activity under the circumstances until the bleeding has stopped. ? ?Bowels: ? ?It is important to keep your bowels regular during the postoperative period. Straining with bowel movements can cause bleeding. A  bowel movement every other day is reasonable. Use a mild laxative if needed, such as milk of magnesia 2-3 tablespoons, or 2 Dulcolax tablets. Call if you continue to have problems. If you had been taking narcotics for pain, before, during or after your surgery, you may be constipated. Take a laxative if necessary. ? ?Medication: ? ?You should resume your pre-surgery medications unless told not to. In addition you may be given an antibiotic to prevent or treat infection. Antibiotics are not always necessary. All medication should be taken as prescribed until the bottles are finished unless you are having an unusual reaction to one of the drugs. ? ?Problems you should report to [place]: ? ?a. Fever greater than 101?F. ?b. Heavy bleeding, or clots (see notes above about blood in urine). ?c. Inability to urinate. ?d. Drug reactions (hives, rash, nausea, vomiting, diarrhea). ?e. Severe burning or pain with urination that is not improving. ? ?Dr. Claudia Desanctis will call you on Wednesday 03/25/22 to see how foley catheter is going and to discuss next steps.  ?

## 2022-03-24 NOTE — Interval H&P Note (Signed)
History and Physical Interval Note: ? ?03/24/2022 ?2:14 PM ? ?[name] [name]  has presented today for surgery, with the diagnosis of GROSS HEMATURIA.  The various methods of treatment have been discussed with the patient and family. After consideration of risks, benefits and other options for treatment, the patient has consented to  Procedure(s) with comments: ?[place], URETEROSCOPY AND STENT PLACEMENT (Left) - 90 MINUTES ?HOLMIUM LASER APPLICATION (Left) ?CYSTOSCOPY WITH BIOPSY (N/A) ?CYSTOSCOPY WITH FULGERATION (N/A) as a surgical intervention.  The patient's history has been reviewed, patient examined, no change in status, stable for surgery.  I have reviewed the patient's chart and labs.  Questions were answered to the patient's satisfaction.   ? ? ?Tenille Morrill D Somnang Mahan ? ? ?

## 2022-03-24 NOTE — Anesthesia Postprocedure Evaluation (Signed)
Anesthesia Post Note ? ?Patient: Siah Steely Habermann ? ?Procedure(s) Performed: CYSTOSCOPY WITH RETROGRADE PYELOGRAM, DIAGNOSTIC URETEROSCOPY WITH BIOPSY AND STENT PLACEMENT (Left: Urethra) ?CYSTOSCOPY WITH FULGERATION (Urethra) ? ?  ? ?Patient location during evaluation: PACU ?Anesthesia Type: General ?Level of consciousness: awake and alert ?Pain management: pain level controlled ?Vital Signs Assessment: post-procedure vital signs reviewed and stable ?Respiratory status: spontaneous breathing, nonlabored ventilation and respiratory function stable ?Cardiovascular status: blood pressure returned to baseline and stable ?Postop Assessment: no apparent nausea or vomiting ?Anesthetic complications: no ? ? ?No notable events documented. ? ?Last Vitals:  ?Vitals:  ? 03/24/22 1630 03/24/22 1645  ?BP: (!) 148/78   ?Pulse: 81 79  ?Resp: 20   ?Temp: (!) 36.4 ?C   ?SpO2: 98% 99%  ?  ?Last Pain:  ?Vitals:  ? 03/24/22 1630  ?TempSrc: Oral  ?PainSc: 3   ? ? ?  ?  ?  ?  ?  ?  ? ?Lynda Rainwater ? ? ? ? ?

## 2022-03-25 ENCOUNTER — Encounter (HOSPITAL_COMMUNITY): Payer: Self-pay | Admitting: Urology

## 2022-03-26 LAB — SURGICAL PATHOLOGY

## 2022-03-27 DIAGNOSIS — N131 Hydronephrosis with ureteral stricture, not elsewhere classified: Secondary | ICD-10-CM | POA: Diagnosis not present

## 2022-03-27 DIAGNOSIS — R310 Gross hematuria: Secondary | ICD-10-CM | POA: Diagnosis not present

## 2022-03-27 DIAGNOSIS — C678 Malignant neoplasm of overlapping sites of bladder: Secondary | ICD-10-CM | POA: Diagnosis not present

## 2022-03-27 LAB — CYTOLOGY - NON PAP

## 2022-04-01 DIAGNOSIS — C678 Malignant neoplasm of overlapping sites of bladder: Secondary | ICD-10-CM | POA: Diagnosis not present

## 2022-04-01 DIAGNOSIS — N131 Hydronephrosis with ureteral stricture, not elsewhere classified: Secondary | ICD-10-CM | POA: Diagnosis not present

## 2022-04-01 DIAGNOSIS — R310 Gross hematuria: Secondary | ICD-10-CM | POA: Diagnosis not present

## 2022-04-02 ENCOUNTER — Other Ambulatory Visit: Payer: Self-pay | Admitting: Urology

## 2022-04-03 ENCOUNTER — Encounter (HOSPITAL_COMMUNITY): Payer: Self-pay | Admitting: Urology

## 2022-04-03 ENCOUNTER — Other Ambulatory Visit: Payer: Self-pay

## 2022-04-06 NOTE — H&P (Signed)
CC/HPI: cc: bladder cancer, gross hematuria, ureteral mass?  ? ? ?03/27/22: 88 year old man with hx of recurrent high-grade Ta bladder cancer diagnosed September 2018. Most recent recurrence noted on last cystoscopy in December 2022 when he underwent small TURBT on 12/02/2021 and pathology showed papilloma. He continued to have intermittent gross hematuria and was found to have bloody E flux from left ureteral orifice. Subsequent CT of the abdomen pelvis showed mass/blood products in upper ureter. He underwent diagnostic ureteroscopy with ureteral washing and attempted biopsy on 03/24/2022. He comes in today for reassessment and for Foley removal. He has had pretty severe pain since surgery related to foley.  ? ?04/01/2022: 88 year old man with the above history here for follow-up. He is doing better with the Foley removed. He is requiring 1 pain pill daily to help with the stent discomfort. Urinary urgency and leakage has been worse with the stent in place. He is continuing to manage okay though. I reviewed BMP and CBC with patient. Kidney function improved since placing the stent and hemoglobin is stable.  ? ?  ?ALLERGIES: Cipro TABS - Diarrhea, **Diarrhea if taking '750mg'$ , '500mg'$  ok to take** ?Macrobid - Respiratory Distress ?  ? ?MEDICATIONS: Finasteride 5 mg tablet 1 tablet PO Daily  ?Levothyroxine Sodium 50 mcg tablet  ?Myrbetriq 50 mg tablet, extended release 24 hr 1 tablet PO Daily  ?Amlodipine Besylate 10 mg tablet tablet  ?Aspirin Ec 81 mg tablet, delayed release  ?Ferrous Fumarate  ?Irbesartan 300 mg tablet  ?Metamucil  ?Metamucil  ?Oxycodone Hcl 5 mg tablet 1 tablet PO Q 6 H PRN severe postop pain  ?Oxycodone Hcl 5 mg tablet 1 tablet PO Q 6 H PRN severe postop pain  ?Pantoprazole Sodium 40 mg tablet, delayed release  ?Rosuvastatin Calcium 5 mg tablet  ?Silodosin 8 mg capsule  ?Vitamin D3  ?Zinc 30 mg tablet  ?  ? ?GU PSH: Bladder Instill AntiCA Agent - 02/04/2021, 01/28/2021, 01/21/2021, 09/05/2020, 08/15/2020, 2021,  2021, 2021, 2021, 2021, 2021, 2019, 2018, 2018, 2018, 2018, 2018 ?Cysto Bladder Ureth Biopsy - 06/25/2020 ?Cysto Dilate Stricture (M or F) ?Cystoscopy - 03/11/2022, 11/13/2021, 09/18/2021, 06/23/2021, 05/22/2021, 02/17/2021, 10/14/2020, 05/16/2020, 11/09/2019, 2020, 2020, 2020, 2019, 2019, 2019, 2019, 2018 ?Cystoscopy Insert Stent, Right - 2018 ?Cystoscopy TURBT <2 cm - 12/02/2021 ?Cystoscopy TURBT >5 cm - 2021, 2018 ?Cystoscopy TURBT 2-5 cm - 07/08/2021, 10/29/2020, 2018 ?Cystoscopy TURP ?Locm 300-'399Mg'$ /Ml Iodine,1Ml - 2018 ?Remove Prostate Regrowth - 2021 ? ?  ? ?NON-GU PSH: Cataract surgery - 2019 ?Knee Arthroscopy, Left ? ?  ? ?GU PMH: Bladder Cancer overlapping sites - 03/27/2022, - 03/17/2022, - 03/11/2022, - 12/08/2021, - 11/13/2021, - 09/18/2021, - 07/30/2021, - 06/23/2021, - 02/27/2021, - 02/17/2021, - 02/04/2021, - 01/28/2021, - 01/21/2021, - 01/14/2021, - 12/27/2020, - 11/11/2020, - 09/11/2020, - 09/05/2020, - 08/15/2020, - 08/08/2020, - 2020, - 2020, - 2020, - 2019, - 2019, - 2019, - 2019, - 2018, - 2018 ?Gross hematuria - 03/27/2022, - 03/17/2022, - 03/11/2022, - 09/18/2021, - 07/30/2021 (Stable), - 06/23/2021, - 06/23/2021, - 06/09/2021 (Stable), - 02/27/2021, Improved, - 2021, - 2021, - 2021, - 11/09/2019 (Improving, Chronic), Reassured at this time if urine is only light pink/clear cherry he does not need admitting for CBI. He will continue with ABX as Rx'd and proceed with cysto on 09/20/17 as scheduled. Instructed pt and family that if he develops gross hematuria with clots will need to go immediately to [facility] ER for evaluation. Culture urine. Will continue with current ABX. , - 2018, - 2018, - 2018, -  2018 ?Ureteral obstruction - 03/27/2022 ?Urinary Urgency - 12/08/2021, - 05/22/2021, - 02/17/2021, - 11/11/2020 ?Urinary Frequency - 07/30/2021, - 02/17/2021, - 10/14/2020 ?Bladder Cancer Posterior - 07/14/2021, - 06/09/2021, - 05/22/2021, Patient with possible bladder tumor recurrence on cystoscopy today. He has active oozing from posterior bladder wall in  between 2 prior TUR sites with irregular bladder mucosa. Given his recent recurrence we discussed proceeding with cystoscopy, bladder biopsy and fulguration of abnormal tissue. Will also discussed with patient intravesical gemcitabine. Risks and benefits of the procedure discussed with the patient including but not limited to pain, bleeding, possible need for Foley catheter, injury to surrounding structures including bladder ureters and prostate, need for future treatment, infection., - 10/14/2020 ?Acute Cystitis/UTI - 06/09/2021, - 02/27/2021, - 12/27/2020, - 11/11/2020, - 07/05/2020 ?BPH w/LUTS - 05/22/2021, - 02/17/2021, Patient remains on Rapaflo and finasteride however still struggling with urinary frequency. Cystoscopy shows wide open prosthetic urethra. PVR is less than 100 mL today in the office. We discussed a trial of Myrbetriq 25 mg daily. I did discuss that he needs to take his blood pressure while starting medication. He will let me know if he has any adverse side effects., - 10/14/2020, - 2020, - 2020, - 2020, - 2019, - 2019, - 2019, - 2019, - 2018, Benign prostatic hyperplasia with urinary obstruction, - 2014 ?Urinary Tract Inf, Unspec site - 01/01/2021 ?Urinary Retention - 2021 ?ED due to arterial insufficiency, Erectile dysfunction due to arterial insufficiency - 2014 ?Urinary Retention, Unspec, Incomplete bladder emptying - 2014 ?History of bladder cancer ?  ?   ?PMH Notes: .  ? ?NON-GU PMH: Arthritis ?GERD ?Hypercholesterolemia ?Hypertension ?Hypothyroidism ?  ? ?FAMILY HISTORY: Alzheimer's Disease - Mother ?Brain tumor - Mother ?Breast Cancer - Mother ?Cardiac Devices Pacemaker Present - Sister ?copd - Father ?Death of family member - Father, Mother ?Diabetes - Brother ?Hypertension - Runs In Family ?Kidney Stones - Son, Runs in Family  ?  Notes: 2 sons; 1 daughter  ? ?SOCIAL HISTORY: Marital Status: Widowed ?Preferred Language: [place]; Ethnicity: Not Hispanic Or Latino; Race: White ?Current Smoking  Status: Patient has never smoked.  ? ?Tobacco Use Assessment Completed: Used Tobacco in last 30 days? ?Drinks 4 drinks per month.  ?Drinks 4+ caffeinated drinks per day. ?Patient's occupation is/was retired. ?  ? ?REVIEW OF SYSTEMS:    ?GU Review Male:   Patient denies frequent urination, hard to postpone urination, burning/ pain with urination, get up at night to urinate, leakage of urine, stream starts and stops, trouble starting your stream, have to strain to urinate , erection problems, and penile pain.  ?Gastrointestinal (Upper):   Patient denies nausea, vomiting, and indigestion/ heartburn.  ?Gastrointestinal (Lower):   Patient denies diarrhea and constipation.  ?Constitutional:   Patient denies fever, night sweats, weight loss, and fatigue.  ?Skin:   Patient denies skin rash/ lesion and itching.  ?Eyes:   Patient denies blurred vision and double vision.  ?Ears/ Nose/ Throat:   Patient denies sore throat and sinus problems.  ?Hematologic/Lymphatic:   Patient denies swollen glands and easy bruising.  ?Cardiovascular:   Patient denies leg swelling and chest pains.  ?Respiratory:   Patient denies cough and shortness of breath.  ?Endocrine:   Patient denies excessive thirst.  ?Musculoskeletal:   Patient denies back pain and joint pain.  ?Neurological:   Patient denies headaches and dizziness.  ?Psychologic:   Patient denies depression and anxiety.  ? ?VITAL SIGNS:    ?  04/01/2022 10:28 AM  ?Weight 179 lb /  81.19 kg  ?Height 68 in / 172.72 cm  ?BP 143/66 mmHg  ?Pulse 86 /min  ?Temperature 98.0 F / 36.6 C  ?BMI 27.2 kg/m?  ? ?MULTI-SYSTEM PHYSICAL EXAMINATION:    ?Constitutional: Well-nourished. No physical deformities. Normally developed. Good grooming.  ?Neck: Neck symmetrical, not swollen. Normal tracheal position.  ?Respiratory: No labored breathing, no use of accessory muscles.   ?Skin: No paleness, no jaundice, no cyanosis. No lesion, no ulcer, no rash.  ?Neurologic / Psychiatric: Oriented to time, oriented  to place, oriented to person. No depression, no anxiety, no agitation.  ?Eyes: Normal conjunctivae. Normal eyelids.  ?Ears, Nose, Mouth, and Throat: Left ear no scars, no lesions, no masses. Right ear no sca

## 2022-04-07 ENCOUNTER — Ambulatory Visit (HOSPITAL_BASED_OUTPATIENT_CLINIC_OR_DEPARTMENT_OTHER): Payer: Medicare Other | Admitting: Anesthesiology

## 2022-04-07 ENCOUNTER — Ambulatory Visit (HOSPITAL_COMMUNITY): Payer: Medicare Other

## 2022-04-07 ENCOUNTER — Ambulatory Visit (HOSPITAL_COMMUNITY): Payer: Medicare Other | Admitting: Anesthesiology

## 2022-04-07 ENCOUNTER — Encounter (HOSPITAL_COMMUNITY): Payer: Self-pay | Admitting: Urology

## 2022-04-07 ENCOUNTER — Other Ambulatory Visit: Payer: Self-pay

## 2022-04-07 ENCOUNTER — Encounter (HOSPITAL_COMMUNITY)
Admission: RE | Disposition: A | Discharge status: Home / Self Care | Payer: Self-pay | Source: Home / Self Care | Attending: Urology

## 2022-04-07 ENCOUNTER — Ambulatory Visit (HOSPITAL_COMMUNITY)
Admission: RE | Admit: 2022-04-07 | Discharge: 2022-04-07 | Disposition: A | Discharge status: Home / Self Care | Payer: Medicare Other | Attending: Urology | Admitting: Urology

## 2022-04-07 DIAGNOSIS — N2889 Other specified disorders of kidney and ureter: Secondary | ICD-10-CM | POA: Diagnosis not present

## 2022-04-07 DIAGNOSIS — N3289 Other specified disorders of bladder: Secondary | ICD-10-CM | POA: Insufficient documentation

## 2022-04-07 DIAGNOSIS — Z79899 Other long term (current) drug therapy: Secondary | ICD-10-CM | POA: Diagnosis not present

## 2022-04-07 DIAGNOSIS — D0919 Carcinoma in situ of other urinary organs: Secondary | ICD-10-CM | POA: Diagnosis not present

## 2022-04-07 DIAGNOSIS — E039 Hypothyroidism, unspecified: Secondary | ICD-10-CM | POA: Insufficient documentation

## 2022-04-07 DIAGNOSIS — N189 Chronic kidney disease, unspecified: Secondary | ICD-10-CM | POA: Insufficient documentation

## 2022-04-07 DIAGNOSIS — Z8551 Personal history of malignant neoplasm of bladder: Secondary | ICD-10-CM | POA: Diagnosis not present

## 2022-04-07 DIAGNOSIS — K219 Gastro-esophageal reflux disease without esophagitis: Secondary | ICD-10-CM | POA: Diagnosis not present

## 2022-04-07 DIAGNOSIS — R310 Gross hematuria: Secondary | ICD-10-CM

## 2022-04-07 DIAGNOSIS — I10 Essential (primary) hypertension: Secondary | ICD-10-CM | POA: Diagnosis not present

## 2022-04-07 DIAGNOSIS — C662 Malignant neoplasm of left ureter: Secondary | ICD-10-CM | POA: Insufficient documentation

## 2022-04-07 DIAGNOSIS — I129 Hypertensive chronic kidney disease with stage 1 through stage 4 chronic kidney disease, or unspecified chronic kidney disease: Secondary | ICD-10-CM | POA: Diagnosis not present

## 2022-04-07 HISTORY — PX: CYSTOSCOPY WITH RETROGRADE PYELOGRAM, URETEROSCOPY AND STENT PLACEMENT: SHX5789

## 2022-04-07 LAB — CBC
HCT: 30.3 % — ABNORMAL LOW (ref 39.0–52.0)
Hemoglobin: 9.9 g/dL — ABNORMAL LOW (ref 13.0–17.0)
MCH: 29.8 pg (ref 26.0–34.0)
MCHC: 32.7 g/dL (ref 30.0–36.0)
MCV: 91.3 fL (ref 80.0–100.0)
Platelets: 287 10*3/uL (ref 150–400)
RBC: 3.32 MIL/uL — ABNORMAL LOW (ref 4.22–5.81)
RDW: 14.3 % (ref 11.5–15.5)
WBC: 8.7 10*3/uL (ref 4.0–10.5)
nRBC: 0 % (ref 0.0–0.2)

## 2022-04-07 LAB — BASIC METABOLIC PANEL
Anion gap: 10 (ref 5–15)
BUN: 44 mg/dL — ABNORMAL HIGH (ref 8–23)
CO2: 17 mmol/L — ABNORMAL LOW (ref 22–32)
Calcium: 8.9 mg/dL (ref 8.9–10.3)
Chloride: 111 mmol/L (ref 98–111)
Creatinine, Ser: 3.16 mg/dL — ABNORMAL HIGH (ref 0.61–1.24)
GFR, Estimated: 18 mL/min — ABNORMAL LOW (ref >60)
Glucose, Bld: 103 mg/dL — ABNORMAL HIGH (ref 70–99)
Potassium: 4.2 mmol/L (ref 3.5–5.1)
Sodium: 138 mmol/L (ref 135–145)

## 2022-04-07 SURGERY — CYSTOURETEROSCOPY, WITH RETROGRADE PYELOGRAM AND STENT INSERTION
Anesthesia: General | Site: Urethra | Laterality: Left

## 2022-04-07 MED ORDER — EPHEDRINE SULFATE-NACL 50-0.9 MG/10ML-% IV SOSY
PREFILLED_SYRINGE | INTRAVENOUS | Status: DC | PRN
Start: 2022-04-07 — End: 2022-04-07
  Administered 2022-04-07 (×2): 5 mg via INTRAVENOUS

## 2022-04-07 MED ORDER — LIDOCAINE HCL (PF) 2 % IJ SOLN
INTRAMUSCULAR | Status: AC
  Filled 2022-04-07: qty 5

## 2022-04-07 MED ORDER — FENTANYL CITRATE PF 50 MCG/ML IJ SOSY
PREFILLED_SYRINGE | INTRAMUSCULAR | Status: AC
  Administered 2022-04-07: 25 ug via INTRAVENOUS
  Filled 2022-04-07: qty 1

## 2022-04-07 MED ORDER — OXYCODONE HCL 5 MG PO TABS: 5.0000 mg | ORAL_TABLET | Freq: Once | ORAL | Status: AC | PRN

## 2022-04-07 MED ORDER — FENTANYL CITRATE (PF) 100 MCG/2ML IJ SOLN
INTRAMUSCULAR | Status: DC | PRN
  Administered 2022-04-07 (×2): 25 ug via INTRAVENOUS

## 2022-04-07 MED ORDER — FENTANYL CITRATE (PF) 100 MCG/2ML IJ SOLN
INTRAMUSCULAR | Status: AC
  Filled 2022-04-07: qty 2

## 2022-04-07 MED ORDER — ACETAMINOPHEN 500 MG PO TABS
1000.0000 mg | ORAL_TABLET | Freq: Once | ORAL | Status: AC
  Administered 2022-04-07: 1000 mg via ORAL
  Filled 2022-04-07: qty 2

## 2022-04-07 MED ORDER — LACTATED RINGERS IV SOLN: INTRAVENOUS | Status: DC

## 2022-04-07 MED ORDER — AMISULPRIDE (ANTIEMETIC) 5 MG/2ML IV SOLN: 10.0000 mg | Freq: Once | INTRAVENOUS | Status: DC | PRN

## 2022-04-07 MED ORDER — CIPROFLOXACIN IN D5W 400 MG/200ML IV SOLN
400.0000 mg | INTRAVENOUS | Status: AC
  Administered 2022-04-07: 400 mg via INTRAVENOUS
  Filled 2022-04-07: qty 200

## 2022-04-07 MED ORDER — EPHEDRINE 5 MG/ML INJ
INTRAVENOUS | Status: AC
  Filled 2022-04-07: qty 5

## 2022-04-07 MED ORDER — OXYCODONE HCL 5 MG PO TABS: 5.0000 mg | ORAL_TABLET | Freq: Three times a day (TID) | ORAL | 0 refills | Status: DC | PRN

## 2022-04-07 MED ORDER — FENTANYL CITRATE PF 50 MCG/ML IJ SOSY
25.0000 ug | PREFILLED_SYRINGE | INTRAMUSCULAR | Status: DC | PRN
  Administered 2022-04-07 (×2): 25 ug via INTRAVENOUS

## 2022-04-07 MED ORDER — IOHEXOL 300 MG/ML  SOLN
INTRAMUSCULAR | Status: DC | PRN
  Administered 2022-04-07: 10 mL via URETHRAL

## 2022-04-07 MED ORDER — PROPOFOL 10 MG/ML IV BOLUS
INTRAVENOUS | Status: DC | PRN
Start: 2022-04-07 — End: 2022-04-07
  Administered 2022-04-07: 170 mg via INTRAVENOUS
  Administered 2022-04-07: 30 mg via INTRAVENOUS

## 2022-04-07 MED ORDER — ONDANSETRON HCL 4 MG/2ML IJ SOLN
INTRAMUSCULAR | Status: AC
  Filled 2022-04-07: qty 2

## 2022-04-07 MED ORDER — ONDANSETRON HCL 4 MG/2ML IJ SOLN: 4.0000 mg | Freq: Once | INTRAMUSCULAR | Status: DC | PRN

## 2022-04-07 MED ORDER — PROPOFOL 10 MG/ML IV BOLUS
INTRAVENOUS | Status: AC
  Filled 2022-04-07: qty 20

## 2022-04-07 MED ORDER — OXYCODONE HCL 5 MG/5ML PO SOLN: 5.0000 mg | Freq: Once | ORAL | Status: AC | PRN

## 2022-04-07 MED ORDER — CHLORHEXIDINE GLUCONATE 0.12 % MT SOLN
15.0000 mL | Freq: Once | OROMUCOSAL | Status: AC
  Administered 2022-04-07: 15 mL via OROMUCOSAL

## 2022-04-07 MED ORDER — SODIUM CHLORIDE 0.9 % IR SOLN
Status: DC | PRN
  Administered 2022-04-07: 2000 mL via INTRAVESICAL
  Administered 2022-04-07: 1000 mL

## 2022-04-07 MED ORDER — LIDOCAINE 2% (20 MG/ML) 5 ML SYRINGE
INTRAMUSCULAR | Status: DC | PRN
Start: 2022-04-07 — End: 2022-04-07
  Administered 2022-04-07: 60 mg via INTRAVENOUS

## 2022-04-07 MED ORDER — OXYCODONE HCL 5 MG PO TABS
ORAL_TABLET | ORAL | Status: AC
  Administered 2022-04-07: 5 mg via ORAL
  Filled 2022-04-07: qty 1

## 2022-04-07 MED ORDER — ORAL CARE MOUTH RINSE: 15.0000 mL | Freq: Once | OROMUCOSAL | Status: AC

## 2022-04-07 MED ORDER — ONDANSETRON HCL 4 MG/2ML IJ SOLN
INTRAMUSCULAR | Status: DC | PRN
Start: 2022-04-07 — End: 2022-04-07
  Administered 2022-04-07: 4 mg via INTRAVENOUS

## 2022-04-07 SURGICAL SUPPLY — 26 items
BAG URO CATCHER STRL LF (MISCELLANEOUS) ×4 IMPLANT
BASKET ZERO TIP NITINOL 2.4FR (BASKET) ×2 IMPLANT
BSKT STON RTRVL ZERO TP 2.4FR (BASKET) ×2
CATH URETL OPEN 5X70 (CATHETERS) ×4 IMPLANT
CLOTH BEACON ORANGE TIMEOUT ST (SAFETY) ×2 IMPLANT
DRSG TEGADERM 2-3/8X2-3/4 SM (GAUZE/BANDAGES/DRESSINGS) IMPLANT
EXTRACTOR STONE 1.7FRX115CM (UROLOGICAL SUPPLIES) IMPLANT
GLOVE BIO SURGEON STRL SZ 6.5 (GLOVE) ×4 IMPLANT
GLOVE BIOGEL PI IND STRL 6.5 (GLOVE) ×2 IMPLANT
GLOVE BIOGEL PI IND STRL 7.0 (GLOVE) ×1 IMPLANT
GLOVE BIOGEL PI INDICATOR 6.5 (GLOVE) ×2
GLOVE BIOGEL PI INDICATOR 7.0 (GLOVE) ×1
GOWN STRL REUS W/TWL LRG LVL3 (GOWN DISPOSABLE) ×4 IMPLANT
GUIDEWIRE STR DUAL SENSOR (WIRE) ×6 IMPLANT
KIT TURNOVER KIT A (KITS) ×2 IMPLANT
LASER FIB FLEXIVA PULSE ID 365 (Laser) ×2 IMPLANT
MANIFOLD NEPTUNE II (INSTRUMENTS) ×4 IMPLANT
PACK CYSTO (CUSTOM PROCEDURE TRAY) ×4 IMPLANT
PAD TELFA 2X3 NADH STRL (GAUZE/BANDAGES/DRESSINGS) ×2 IMPLANT
SHEATH NAVIGATOR HD 11/13X28 (SHEATH) IMPLANT
SHEATH NAVIGATOR HD 11/13X36 (SHEATH) ×2 IMPLANT
STENT URET 6FRX26 CONTOUR (STENTS) ×2 IMPLANT
TRACTIP FLEXIVA PULS ID 200XHI (Laser) ×2 IMPLANT
TRACTIP FLEXIVA PULSE ID 200 (Laser)
TUBING CONNECTING 10 (TUBING) ×4 IMPLANT
TUBING UROLOGY SET (TUBING) ×4 IMPLANT

## 2022-04-07 NOTE — Transfer of Care (Signed)
Immediate Anesthesia Transfer of Care Note ? ?Patient: [name] [name] ? ?Procedure(s) Performed: CYSTOSCOPY WITH  RETROGRADE PYELOGRAM, DIAGNOSTIC URETEROSCOPY WITH BIOPSY AND STENT EXCHANGE (Left: Urethra) ? ?Patient Location: PACU ? ?Anesthesia Type:General ? ?Level of Consciousness: drowsy ? ?Airway & Oxygen Therapy: Patient Spontanous Breathing and Patient connected to face mask oxygen ? ?Post-op Assessment: Report given to RN and Post -op Vital signs reviewed and stable ? ?Post vital signs: Reviewed and stable ? ?Last Vitals:  ?Vitals Value Taken Time  ?BP 133/66 04/07/22 1305  ?Temp    ?Pulse 75 04/07/22 1307  ?Resp 16 04/07/22 1307  ?SpO2 100 % 04/07/22 1307  ?Vitals shown include unvalidated device data. ? ?Last Pain:  ?Vitals:  ? 04/07/22 1121  ?TempSrc: Oral  ?PainSc: 0-No pain  ?   ? ?Patients Stated Pain Goal: 3 (04/07/22 1121) ? ?Complications: No notable events documented. ?

## 2022-04-07 NOTE — Op Note (Signed)
Preoperative diagnosis: Gross hematuria, question left ureteral mass ? ?Postoperative diagnosis: Gross hematuria, ureteral mass ? ?Procedure: ? ?Cystoscopy ?left diagnostic ureteroscopy with ureteral biopsy  ?left 55F x 26 ureteral stent exchange ?left retrograde pyelography with interpretation ? ?Surgeon: Jacalyn Lefevre, MD ? ?Anesthesia: General ? ?Complications: None ? 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 ?EBL: Minimal ? ?Specimens: ?left ureteral biopsy ?Left proximal ureteral biopsy ? ? ?Indication: [name] [name] is a 88 y.o.   patient with a history of gross hematuria and bloody efflux from left UO.  He underwent diagnostic ureteroscopy with ureteral washing 2 weeks ago.  He now returns for repeat diagnostic ureteroscopy to evaluate possible ureteral mass.  After reviewing the management options for treatment, the patient elected to proceed with the above surgical procedure(s). We have discussed the potential benefits and risks of the procedure, side effects of the proposed treatment, the likelihood of the patient achieving the goals of the procedure, and any potential problems that might occur during the procedure or recuperation. Informed consent has been obtained. ? ? ?Description of procedure: ? ?The patient was taken to the operating room and general anesthesia was induced.  The patient was placed in the dorsal lithotomy position, prepped and draped in the usual sterile fashion, and  preoperative antibiotics were administered. A preoperative time-out was performed.  ? ?Cystourethroscopy was performed.  The patient?s urethra was examined and demonstrated evidence of prior TUR. the bladder was then systematically examined in its entirety.  See findings noted above ? ?Attention then turned to the left ureteral orifice and graspers were used to bring the existing left ureteral stent to the urethral meatus.  A 0.38 sensor wire was then advanced through the ureteral stent to the kidney and fluoroscopic guidance.  The stent was discarded.  It was examined and deemed intact prior to discarding.  Next an open-ended ureteral catheter was placed over the wire and the wire was removed.  A retrograde pyelogram was obtained.  Again not much contrast was seen proximal to the proximal ureter.  The wire was replaced through the open-ended ureteral catheter and the ureteral catheter was removed.  Next semirigid ureteroscopy took place alongside the wire.   ? ?At this point time there is free-floating tissue that was basket extracted and sent as the first ureteral biopsy.  The ureteroscope was advanced in the proximal ureter where a blood clot was seen.  No mass was seen at this point.  A second sensor was placed through the ureteroscope and the ureteroscope was removed.  Next a ureteral access sheath was placed over the second sensor wire and advanced to the proximal ureter with fluoroscopic guidance.  The inner sheath and wire removed.  Flexible ureteroscopy then took place.  A mass occluding the entire ureter was seen in the proximal ureter.  I was unable to traverse the scope past the mass.  The mass appeared papillary with prior blood clot seen approximately. ? ?The ureteroscope was then removed and unison with the access sheath taking care to examine the ureter on the way out.  There  is no evidence of distal ureteral masses. ? ?The wire was then backloaded through the cystoscope and a ureteral stent was  advance over the wire using Seldinger technique.  The stent was positioned appropriately under fluoroscopic and cystoscopic guidance.  The wire was then removed with an adequate stent curl noted in the renal pelvis as well as in the bladder. ? ?The bladder was then emptied and the procedure ended.  The patient appeared to tolerate the procedure well and without complications.  The patient was able to be awakened and transferred to the recovery unit in satisfactory condition.  ? ?Disposition: No tether was left on the stent.  ? ?Plan: ?I called patient's daughter and discussed findings of ureteral mass.  I discussed management options including: ?Observation ?Chronic indwelling stent with changes every 4 to 6 months ?Placement of left percutaneous nephrostomy tube with [name] instillation ?Discussion about possible nephro ureterectomy however unsure whether or not patient would be healthy enough to undergo this operation ?

## 2022-04-07 NOTE — Anesthesia Preprocedure Evaluation (Addendum)
Anesthesia Evaluation  ?Patient identified by MRN, date of birth, ID band ?Patient awake ? ? ? ?Reviewed: ?Allergy & Precautions, NPO status , Patient's Chart, lab work & pertinent test results ? ?Airway ?Mallampati: II ? ?TM Distance: >3 FB ?Neck ROM: Full ? ? ? Dental ?no notable dental hx. ?(+)  ?  ?Pulmonary ?asthma , sleep apnea ,  ?  ?Pulmonary exam normal ?breath sounds clear to auscultation ? ? ? ? ? ? Cardiovascular ?Exercise Tolerance: Good ?hypertension, Pt. on medications ?Normal cardiovascular exam+ dysrhythmias  ?Rhythm:Regular Rate:Normal ? ? ?  ?Neuro/Psych ?negative neurological ROS ? negative psych ROS  ? GI/Hepatic ?Neg liver ROS, GERD  Medicated,  ?Endo/Other  ?Hypothyroidism  ? Renal/GU ?Renal Insufficiency and CRFRenal disease Bladder dysfunction (bladder cancer) ? ? ? ?  ?Musculoskeletal ? ?(+) Arthritis ,  ? Abdominal ?  ?Peds ?negative pediatric ROS ?(+)  Hematology ?negative hematology ROS ?(+)   ?Anesthesia Other Findings ? ? Reproductive/Obstetrics ?negative OB ROS ? ?  ? ? ? ? ? ? ? ? ? ? ? ? ? ?  ?  ? ? ? ? ? ? ? ?Anesthesia Physical ?Anesthesia Plan ? ?ASA: 3 ? ?Anesthesia Plan: General  ? ?Post-op Pain Management: Tylenol PO (pre-op)*  ? ?Induction: Intravenous ? ?PONV Risk Score and Plan: 2 and Treatment may vary due to age or medical condition, Ondansetron and Dexamethasone ? ?Airway Management Planned: LMA ? ?Additional Equipment: None ? ?Intra-op Plan:  ? ?Post-operative Plan: Extubation in OR ? ?Informed Consent: I have reviewed the patients History and Physical, chart, labs and discussed the procedure including the risks, benefits and alternatives for the proposed anesthesia with the patient or authorized representative who has indicated his/her understanding and acceptance.  ? ? ? ?Dental advisory given ? ?Plan Discussed with: CRNA, Anesthesiologist and Surgeon ? ?Anesthesia Plan Comments:   ? ? ? ? ? ?Anesthesia Quick Evaluation ? ?

## 2022-04-07 NOTE — Discharge Instructions (Signed)
DISCHARGE INSTRUCTIONS FOR KIDNEY STONE/URETERAL STENT  ? ?MEDICATIONS:  ?1. Resume all your other meds from home  ?2. AZO over the counter can help with the burning/stinging when you urinate. ?3. Tramadol is for moderate/severe pain, otherwise taking up to 1000 mg every 6 hours of plain Tylenol will help treat your pain.   ?4. Take ____ one hour prior to removal of your stent.  ? ? ?ACTIVITY:  ?1. No strenuous activity x 1week  ?2. No driving while on narcotic pain medications  ?3. Drink plenty of water  ?4. Continue to walk at home - you can still get blood clots when you are at home, so keep active, but don't over do it.  ?5. May return to work/school tomorrow or when you feel ready  ? ?BATHING:  ?1. You can shower and we recommend daily showers  ?2. You have a string coming from your urethra: The stent string is attached to your ureteral stent. Do not pull on this.  ? ?SIGNS/SYMPTOMS TO CALL:  ?Please call us if you have a fever greater than 101.5, uncontrolled nausea/vomiting, uncontrolled pain, dizziness, unable to urinate, bloody urine, chest pain, shortness of breath, leg swelling, leg pain, redness around wound, drainage from wound, or any other concerns or questions.  ? ?You can reach [place] at [phone].  ? ?FOLLOW-UP:  ?1. You have a string attached to your stent, you may remove it on __________. To do this, pull the stringsuntil the stent is completely removed. You may feel an odd sensation in your back.  ?

## 2022-04-07 NOTE — Interval H&P Note (Signed)
History and Physical Interval Note: ? ?04/07/2022 ?12:07 PM ? ?[name] [name]  has presented today for surgery, with the diagnosis of GROSS HEMATURIA.  The various methods of treatment have been discussed with the patient and family. After consideration of risks, benefits and other options for treatment, the patient has consented to  Procedure(s) with comments: ?[place], URETEROSCOPY WITH BIOPSY AND STENT EXCHANGE (Left) - 75 MINUTES ?HOLMIUM LASER APPLICATION (Left) as a surgical intervention.  The patient's history has been reviewed, patient examined, no change in status, stable for surgery.  I have reviewed the patient's chart and labs.  Questions were answered to the patient's satisfaction.   ? ? ?Olimpia Tinch D Merian Wroe ? ? ?

## 2022-04-07 NOTE — Anesthesia Procedure Notes (Signed)
Procedure Name: LMA Insertion ?Date/Time: 04/07/2022 12:22 PM ?Performed by: Sharlette Dense, CRNA ?Patient Re-evaluated:Patient Re-evaluated prior to induction ?Oxygen Delivery Method: Circle system utilized ?Preoxygenation: Pre-oxygenation with 100% oxygen ?Induction Type: IV induction ?LMA: LMA inserted ?LMA Size: 4.0 ?Number of attempts: 1 ?Placement Confirmation: positive ETCO2 and breath sounds checked- equal and bilateral ?Tube secured with: Tape ?Dental Injury: Teeth and Oropharynx as per pre-operative assessment  ? ? ? ? ?

## 2022-04-07 NOTE — Anesthesia Postprocedure Evaluation (Signed)
Anesthesia Post Note ? ?Patient: [name] [name] ? ?Procedure(s) Performed: CYSTOSCOPY WITH  RETROGRADE PYELOGRAM, DIAGNOSTIC URETEROSCOPY WITH BIOPSY AND STENT EXCHANGE (Left: Urethra) ? ?  ? ?Patient location during evaluation: PACU ?Anesthesia Type: General ?Level of consciousness: awake and alert ?Pain management: pain level controlled ?Vital Signs Assessment: post-procedure vital signs reviewed and stable ?Respiratory status: spontaneous breathing, nonlabored ventilation and respiratory function stable ?Cardiovascular status: blood pressure returned to baseline and stable ?Postop Assessment: no apparent nausea or vomiting ?Anesthetic complications: no ? ? ?No notable events documented. ? ?Last Vitals:  ?Vitals:  ? 04/07/22 1415 04/07/22 1425  ?BP: 121/63 128/79  ?Pulse: 72 80  ?Resp: 14 14  ?Temp: 36.4 ?C 36.4 ?C  ?SpO2: 99% 100%  ?  ?Last Pain:  ?Vitals:  ? 04/07/22 1425  ?TempSrc:   ?PainSc: 0-No pain  ? ? ?  ?  ?  ?  ?  ?  ? ?Merlinda Frederick ? ? ? ? ?

## 2022-04-08 ENCOUNTER — Encounter (HOSPITAL_COMMUNITY): Payer: Self-pay | Admitting: Urology

## 2022-04-08 LAB — SURGICAL PATHOLOGY

## 2022-04-13 DIAGNOSIS — G4737 Central sleep apnea in conditions classified elsewhere: Secondary | ICD-10-CM | POA: Diagnosis not present

## 2022-04-13 DIAGNOSIS — G4733 Obstructive sleep apnea (adult) (pediatric): Secondary | ICD-10-CM | POA: Diagnosis not present

## 2022-04-17 DIAGNOSIS — C678 Malignant neoplasm of overlapping sites of bladder: Secondary | ICD-10-CM | POA: Diagnosis not present

## 2022-04-17 DIAGNOSIS — N131 Hydronephrosis with ureteral stricture, not elsewhere classified: Secondary | ICD-10-CM | POA: Diagnosis not present

## 2022-04-17 DIAGNOSIS — C662 Malignant neoplasm of left ureter: Secondary | ICD-10-CM | POA: Diagnosis not present

## 2022-04-21 ENCOUNTER — Other Ambulatory Visit (HOSPITAL_COMMUNITY): Payer: Self-pay | Admitting: Urology

## 2022-04-21 DIAGNOSIS — N135 Crossing vessel and stricture of ureter without hydronephrosis: Secondary | ICD-10-CM

## 2022-04-22 DIAGNOSIS — E782 Mixed hyperlipidemia: Secondary | ICD-10-CM | POA: Diagnosis not present

## 2022-04-22 DIAGNOSIS — N1832 Chronic kidney disease, stage 3b: Secondary | ICD-10-CM | POA: Diagnosis not present

## 2022-04-22 DIAGNOSIS — I10 Essential (primary) hypertension: Secondary | ICD-10-CM | POA: Diagnosis not present

## 2022-04-22 DIAGNOSIS — I700 Atherosclerosis of aorta: Secondary | ICD-10-CM | POA: Diagnosis not present

## 2022-04-29 DIAGNOSIS — R7309 Other abnormal glucose: Secondary | ICD-10-CM | POA: Diagnosis not present

## 2022-04-29 DIAGNOSIS — N1832 Chronic kidney disease, stage 3b: Secondary | ICD-10-CM | POA: Diagnosis not present

## 2022-04-29 DIAGNOSIS — I10 Essential (primary) hypertension: Secondary | ICD-10-CM | POA: Diagnosis not present

## 2022-04-29 DIAGNOSIS — Z23 Encounter for immunization: Secondary | ICD-10-CM | POA: Diagnosis not present

## 2022-04-29 DIAGNOSIS — E782 Mixed hyperlipidemia: Secondary | ICD-10-CM | POA: Diagnosis not present

## 2022-04-29 DIAGNOSIS — N184 Chronic kidney disease, stage 4 (severe): Secondary | ICD-10-CM | POA: Diagnosis not present

## 2022-04-29 DIAGNOSIS — R600 Localized edema: Secondary | ICD-10-CM | POA: Diagnosis not present

## 2022-04-29 DIAGNOSIS — R29898 Other symptoms and signs involving the musculoskeletal system: Secondary | ICD-10-CM | POA: Diagnosis not present

## 2022-04-29 DIAGNOSIS — E039 Hypothyroidism, unspecified: Secondary | ICD-10-CM | POA: Diagnosis not present

## 2022-04-29 DIAGNOSIS — I129 Hypertensive chronic kidney disease with stage 1 through stage 4 chronic kidney disease, or unspecified chronic kidney disease: Secondary | ICD-10-CM | POA: Diagnosis not present

## 2022-04-29 DIAGNOSIS — I700 Atherosclerosis of aorta: Secondary | ICD-10-CM | POA: Diagnosis not present

## 2022-05-01 ENCOUNTER — Other Ambulatory Visit (HOSPITAL_COMMUNITY): Payer: Medicare Other

## 2022-05-05 ENCOUNTER — Other Ambulatory Visit: Payer: Self-pay | Admitting: Radiology

## 2022-05-05 DIAGNOSIS — C689 Malignant neoplasm of urinary organ, unspecified: Secondary | ICD-10-CM

## 2022-05-06 ENCOUNTER — Ambulatory Visit (HOSPITAL_COMMUNITY)
Admission: RE | Admit: 2022-05-06 | Discharge: 2022-05-06 | Disposition: A | Discharge status: Home / Self Care | Payer: Medicare Other | Source: Ambulatory Visit | Attending: Urology | Admitting: Urology

## 2022-05-06 ENCOUNTER — Encounter (HOSPITAL_COMMUNITY): Payer: Self-pay

## 2022-05-06 ENCOUNTER — Other Ambulatory Visit: Payer: Self-pay

## 2022-05-06 DIAGNOSIS — I129 Hypertensive chronic kidney disease with stage 1 through stage 4 chronic kidney disease, or unspecified chronic kidney disease: Secondary | ICD-10-CM | POA: Diagnosis not present

## 2022-05-06 DIAGNOSIS — Z436 Encounter for attention to other artificial openings of urinary tract: Secondary | ICD-10-CM | POA: Diagnosis not present

## 2022-05-06 DIAGNOSIS — N1832 Chronic kidney disease, stage 3b: Secondary | ICD-10-CM | POA: Diagnosis not present

## 2022-05-06 DIAGNOSIS — N135 Crossing vessel and stricture of ureter without hydronephrosis: Secondary | ICD-10-CM | POA: Insufficient documentation

## 2022-05-06 DIAGNOSIS — E039 Hypothyroidism, unspecified: Secondary | ICD-10-CM | POA: Insufficient documentation

## 2022-05-06 DIAGNOSIS — D631 Anemia in chronic kidney disease: Secondary | ICD-10-CM | POA: Diagnosis not present

## 2022-05-06 DIAGNOSIS — G4730 Sleep apnea, unspecified: Secondary | ICD-10-CM | POA: Insufficient documentation

## 2022-05-06 DIAGNOSIS — K219 Gastro-esophageal reflux disease without esophagitis: Secondary | ICD-10-CM | POA: Diagnosis not present

## 2022-05-06 DIAGNOSIS — E785 Hyperlipidemia, unspecified: Secondary | ICD-10-CM | POA: Diagnosis not present

## 2022-05-06 DIAGNOSIS — C689 Malignant neoplasm of urinary organ, unspecified: Secondary | ICD-10-CM

## 2022-05-06 DIAGNOSIS — N2889 Other specified disorders of kidney and ureter: Secondary | ICD-10-CM | POA: Diagnosis not present

## 2022-05-06 HISTORY — PX: IR NEPHROSTOMY PLACEMENT LEFT: IMG6063

## 2022-05-06 LAB — CBC WITH DIFFERENTIAL/PLATELET
Abs Immature Granulocytes: 0.01 10*3/uL (ref 0.00–0.07)
Basophils Absolute: 0.1 10*3/uL (ref 0.0–0.1)
Basophils Relative: 1 %
Eosinophils Absolute: 0.3 10*3/uL (ref 0.0–0.5)
Eosinophils Relative: 4 %
HCT: 32.6 % — ABNORMAL LOW (ref 39.0–52.0)
Hemoglobin: 10.6 g/dL — ABNORMAL LOW (ref 13.0–17.0)
Immature Granulocytes: 0 %
Lymphocytes Relative: 20 %
Lymphs Abs: 1.2 10*3/uL (ref 0.7–4.0)
MCH: 29.9 pg (ref 26.0–34.0)
MCHC: 32.5 g/dL (ref 30.0–36.0)
MCV: 91.8 fL (ref 80.0–100.0)
Monocytes Absolute: 0.5 10*3/uL (ref 0.1–1.0)
Monocytes Relative: 8 %
Neutro Abs: 4 10*3/uL (ref 1.7–7.7)
Neutrophils Relative %: 67 %
Platelets: 249 10*3/uL (ref 150–400)
RBC: 3.55 MIL/uL — ABNORMAL LOW (ref 4.22–5.81)
RDW: 15.1 % (ref 11.5–15.5)
WBC: 6 10*3/uL (ref 4.0–10.5)
nRBC: 0 % (ref 0.0–0.2)

## 2022-05-06 LAB — BASIC METABOLIC PANEL
Anion gap: 9 (ref 5–15)
BUN: 55 mg/dL — ABNORMAL HIGH (ref 8–23)
CO2: 20 mmol/L — ABNORMAL LOW (ref 22–32)
Calcium: 9.5 mg/dL (ref 8.9–10.3)
Chloride: 114 mmol/L — ABNORMAL HIGH (ref 98–111)
Creatinine, Ser: 2.94 mg/dL — ABNORMAL HIGH (ref 0.61–1.24)
GFR, Estimated: 20 mL/min — ABNORMAL LOW (ref >60)
Glucose, Bld: 103 mg/dL — ABNORMAL HIGH (ref 70–99)
Potassium: 4.3 mmol/L (ref 3.5–5.1)
Sodium: 143 mmol/L (ref 135–145)

## 2022-05-06 LAB — PROTIME-INR
INR: 1 (ref 0.8–1.2)
Prothrombin Time: 13 seconds (ref 11.4–15.2)

## 2022-05-06 MED ORDER — LEVOFLOXACIN IN D5W 500 MG/100ML IV SOLN
500.0000 mg | Freq: Once | INTRAVENOUS | Status: AC
  Administered 2022-05-06: 500 mg via INTRAVENOUS
  Filled 2022-05-06: qty 100

## 2022-05-06 MED ORDER — FENTANYL CITRATE (PF) 100 MCG/2ML IJ SOLN
INTRAMUSCULAR | Status: AC | PRN
  Administered 2022-05-06: 25 ug via INTRAVENOUS

## 2022-05-06 MED ORDER — MIDAZOLAM HCL 2 MG/2ML IJ SOLN
INTRAMUSCULAR | Status: AC | PRN
  Administered 2022-05-06: .5 mg via INTRAVENOUS

## 2022-05-06 MED ORDER — SODIUM CHLORIDE 0.9% FLUSH
5.0000 mL | Freq: Three times a day (TID) | INTRAVENOUS | Status: DC
  Administered 2022-05-06: 5 mL

## 2022-05-06 MED ORDER — FENTANYL CITRATE (PF) 100 MCG/2ML IJ SOLN
INTRAMUSCULAR | Status: AC
  Filled 2022-05-06: qty 2

## 2022-05-06 MED ORDER — IOHEXOL 300 MG/ML  SOLN
50.0000 mL | Freq: Once | INTRAMUSCULAR | Status: AC | PRN
  Administered 2022-05-06: 5 mL

## 2022-05-06 MED ORDER — LIDOCAINE-EPINEPHRINE 1 %-1:100000 IJ SOLN
INTRAMUSCULAR | Status: AC | PRN
  Administered 2022-05-06: 10 mL via INTRADERMAL

## 2022-05-06 MED ORDER — MIDAZOLAM HCL 2 MG/2ML IJ SOLN
INTRAMUSCULAR | Status: AC | PRN
  Administered 2022-05-06: 1 mg via INTRAVENOUS

## 2022-05-06 MED ORDER — MIDAZOLAM HCL 2 MG/2ML IJ SOLN
INTRAMUSCULAR | Status: AC
  Filled 2022-05-06: qty 2

## 2022-05-06 MED ORDER — FENTANYL CITRATE (PF) 100 MCG/2ML IJ SOLN
INTRAMUSCULAR | Status: AC | PRN
Start: 2022-05-06 — End: 2022-05-06
  Administered 2022-05-06: 25 ug via INTRAVENOUS

## 2022-05-06 MED ORDER — FENTANYL CITRATE (PF) 100 MCG/2ML IJ SOLN
INTRAMUSCULAR | Status: AC | PRN
  Administered 2022-05-06: 50 ug via INTRAVENOUS

## 2022-05-06 MED ORDER — SODIUM CHLORIDE 0.9 % IV SOLN: INTRAVENOUS | Status: DC

## 2022-05-06 MED ORDER — LIDOCAINE-EPINEPHRINE 1 %-1:100000 IJ SOLN
INTRAMUSCULAR | Status: AC
  Filled 2022-05-06: qty 1

## 2022-05-06 NOTE — Discharge Instructions (Addendum)
For questions /concerns may call Interventional Radiology at [phone] or  [phone]  You may remove your dressing and shower tomorrow afternoon   Moderate Conscious Sedation, Adult, Care After This sheet gives you information about how to care for yourself after your procedure. Your health care provider may also give you more specific instructions. If you have problems or questions, contact your health careprovider. What can I expect after the procedure? After the procedure, it is common to have: Sleepiness for several hours. Impaired judgment for several hours. Difficulty with balance. Vomiting if you eat too soon. Follow these instructions at home: For the time period you were told by your health care provider: Rest. Do not participate in activities where you could fall or become injured. Do not drive or use machinery. Do not drink alcohol. Do not take sleeping pills or medicines that cause drowsiness. Do not make important decisions or sign legal documents. Do not take care of children on your own. Eating and drinking  Follow the diet recommended by your health care provider. Drink enough fluid to keep your urine pale yellow. If you vomit: Drink water, juice, or soup when you can drink without vomiting. Make sure you have little or no nausea before eating solid foods.  General instructions Take over-the-counter and prescription medicines only as told by your health care provider. Have a responsible adult stay with you for the time you are told. It is important to have someone help care for you until you are awake and alert. Do not smoke. Keep all follow-up visits as told by your health care provider. This is important. Contact a health care provider if: You are still sleepy or having trouble with balance after 24 hours. You feel light-headed. You keep feeling nauseous or you keep vomiting. You develop a rash. You have a fever. You have redness or swelling around the  IV site. Get help right away if: You have trouble breathing. You have new-onset confusion at home. Summary After the procedure, it is common to feel sleepy, have impaired judgment, or feel nauseous if you eat too soon. Rest after you get home. Know the things you should not do after the procedure. Follow the diet recommended by your health care provider and drink enough fluid to keep your urine pale yellow. Get help right away if you have trouble breathing or new-onset confusion at home. This information is not intended to replace advice given to you by your health care provider. Make sure you discuss any questions you have with your healthcare provider.   . Percutaneous Nephrostomy, Care After This sheet gives you information about how to care for yourself after your procedure. Your health care provider may also give you more specific instructions. If you have problems or questions, contact your health care provider. What can I expect after the procedure? After the procedure, it is common to have: Some soreness where the nephrostomy tube was inserted (tube insertion site). Blood-tinged drainage from the nephrostomy tube for the first 24 hours. Follow these instructions at home: Activity  Do not lift anything that is heavier than 10 lb (4.5 kg), or the limit that you are told, until your health care provider says that it is safe. Return to your normal activities as told by your health care provider. Ask your health care provider what activities are safe for you. Avoid activities that may cause the nephrostomy tubing to bend. Do not take baths, swim, or use a hot tub until your health care provider approves.  Ask your health care provider if you can take showers. Cover the nephrostomy tube bandage (dressing) with a watertight covering when you take a shower. If you were given a sedative during the procedure, it can affect you for several hours.  Do not drive or operate machinery until your  health care provider says that it is safe.  Care of the tube insertion site  Follow instructions from your health care provider about how to take care of your tube insertion site. Make sure you: Wash your hands with soap and water for at least 20 seconds before you change your dressing. If soap and water are not available, use hand sanitizer. Change your dressing as told by your health care provider. Be careful not to pull on the tube while removing the dressing. When you change the dressing, wash the skin around the tube, rinse well, and pat the skin dry. DO NOT GET DRESSING WET IN SHOWER.  Check the tube insertion area every day for signs of infection. Check for: Redness, swelling, or pain. Fluid or blood. Warmth. Pus or a bad smell.  Care of the nephrostomy tube and drainage bag Always keep the tubing, the leg bag, or the bedside drainage bags below the level of the kidney so that your urine drains freely. When connecting your nephrostomy tube to a drainage bag, make sure that there are no kinks in the tubing and that your urine is draining freely. You may want to use an elastic bandage to wrap any exposed tubing that goes from the nephrostomy tube to any of the connecting tubes. At night, you may want to connect your nephrostomy tube or the leg bag to a larger bedside drainage bag. Follow instructions from your health care provider about how to empty or change the drainage bag. Empty the drainage bag when it becomes ? full. Measure all drainage when empty bag. Replace the drainage bag and any extension tubing that is connected to your nephrostomy tube every 7 days or as told by your health care provider. Your health care provider will explain how to change the drainage bag and extension tubing.  General instructions Take over-the-counter and prescription medicines only as told by your health care provider. Keep all follow-up visits as told by your health care provider. This is  important. The nephrostomy tube will need to be changed every 8-12 weeks.  Contact a health care provider if: You have problems with any of the valves or tubing. You have persistent pain or soreness in your back. You have redness, swelling, or pain around your tube insertion site. You have fluid or blood coming from your tube insertion site. Your tube insertion site feels warm to the touch. You have pus or a bad smell coming from your tube insertion site. You have increased urine output or you feel burning when urinating. Get help right away if: You have pain in your abdomen during the first week. You have chest pain or have trouble breathing. You have a new appearance of blood in your urine. You have a fever or chills. You have back pain that is not relieved by your medicine. You have decreased urine output. Your nephrostomy tube comes out. Summary After the procedure, it is common to have some soreness where the nephrostomy tube was inserted (tube insertion site). Follow instructions from your health care provider about how to take care of your tube insertion site, nephrostomy tube, and drainage bag. Keep all follow-up visits for care and for changing the tube.  This information is not intended to replace advice given to you by your health care provider. Make sure you discuss any questions you have with your health care provider.

## 2022-05-06 NOTE — Procedures (Signed)
Interventional Radiology Procedure Note  Procedure: Image guided left nephrostomy tube placement  Findings: Please refer to procedural dictation for full description. 10.2 Fr left nephrostomy in lower pole calyx.  Moderate hydronephrosis, dark sanguinous urine output.  Sample sent for culture.  Placed to bag drainage.  Complications: None immediate  Estimated Blood Loss: < 5 mL  Recommendations: Observe for 4 hours.  If no signs of sepsis or worsening hematuria, consider discharge home today. IR will arrange for routine 6-8 week nephrostomy check and change.    Ruthann Cancer, MD Pager: [phone]

## 2022-05-06 NOTE — Consult Note (Signed)
Chief Complaint: Patient was seen in consultation today for left percutaneous nephrostomy  Referring Physician(s): Pace,Maryellen D  Supervising Physician: Ruthann Cancer  Patient Status: [facility] - Out-pt  History of Present Illness: [name] [name] is an 88 y.o. male with past medical history of anemia, arthritis, had asthma, BPH, chronic kidney disease, GERD, colon polyps, nephrolithiasis, hyperlipidemia, hypertension, sleep apnea, hypothyroidism, skin cancer, and bladder cancer in 2011, 2014, 2018 and 2021 with BCG therapy.   He now presents with history of gross hematuria and bloody efflux from the left ureteral orifice.  He underwent diagnostic ureteroscopy with ureteral washing 2 weeks ago.  On 04/07/2022 he underwent cystoscopy, left diagnostic ureteroscopy with ureteral biopsy, left ureteral stent exchange.  Pathology from proximal left ureter biopsy revealed low-grade papillary urothelial carcinoma.  He now presents for left percutaneous nephrostomy for jelmyto to treat upper tract urothelial carcinoma.  Past Medical History:  Diagnosis Date   Anemia    Arthritis    lower back bulging disc, hips, knees, thumbs, shoulder   Asthma    as a pre teen   Bladder cancer ([place]) UROLOGIST-  DR MCKENZIE   S/P TURBT 08-16-2017   BPH (benign prostatic hyperplasia)    CKD (chronic kidney disease), stage III ([place])    STAGE 3  B CKD per lov 10-16-2020 dr Rica Mast [facility] on chart   Fatigue    MIDDLE OF DAY ON OCCASION   Frequency of urination    GERD (gastroesophageal reflux disease)    on protonix   Hematuria    Hematuria    History of colon polyps    History of kidney stones    HLD (hyperlipidemia)    HOH (hard of hearing)    slightly hoh right worse thanleft   HTN (hypertension)    Hypothyroidism    Incontinence of urine    weras pads all the time   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   Right Buccal Cheek   Recurrent bladder papillary carcinoma  ([place]) hx 2012 and 2014-- urologist- dr Alyson Ingles   s/p  TURBT 08-16-2017  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   Sleep apnea    uses cpap set on 20 /4   Trigger finger of left hand 07/04/2021   last few months per pt   UTI (urinary tract infection) 08/20/2020   UTI (urinary tract infection)    finished ampicillin on 06-13-2021 all symptoms resolved   Weakness of extremity    left knee   Wears glasses     Past Surgical History:  Procedure Laterality Date   BCG X 2     AFTER LAST 2 BLADDER TUMOR REMOVALS   CATARACT EXTRACTION, BILATERAL     COLONSCOPY  YRS AGO   CYSTOSCOPY N/A 10/29/2020   Procedure: CYSTOSCOPY;  Surgeon: Robley Fries, MD;  Location: [facility];  Service: Urology;  Laterality: N/A;   CYSTOSCOPY N/A 07/08/2021   Procedure: CYSTOSCOPY;  Surgeon: Robley Fries, MD;  Location: [facility];  Service: Urology;  Laterality: N/A;   CYSTOSCOPY W/ URETERAL STENT PLACEMENT Bilateral 08/16/2017   Procedure: CYSTOSCOPY WITH RETROGRADE PYELOGRAM/URETERAL STENT PLACEMENT;  Surgeon: Cleon Gustin, MD;  Location: [facility];  Service: Urology;  Laterality: Bilateral;   CYSTOSCOPY WITH BIOPSY N/A 06/25/2020   Procedure: CYSTOSCOPY FULGURATION   BLADDER  BIOPSY;  Surgeon: Robley Fries, MD;  Location: [facility];  Service: Urology;  Laterality: N/A;   CYSTOSCOPY  WITH FULGERATION N/A 12/08/2019   Procedure: CYSTOSCOPY WITH FULGERATION;  Surgeon: Cleon Gustin, MD;  Location[facility];  Service: Urology;  Laterality: N/A;   CYSTOSCOPY WITH FULGERATION N/A 03/24/2022   Procedure: CYSTOSCOPY WITH FULGERATION;  Surgeon: Robley Fries, MD;  Location: [facility];  Service: Urology;  Laterality: N/A;   CYSTOSCOPY WITH RETROGRADE PYELOGRAM, URETEROSCOPY AND STENT PLACEMENT Left 03/24/2022   Procedure: CYSTOSCOPY WITH RETROGRADE PYELOGRAM, DIAGNOSTIC URETEROSCOPY WITH  BIOPSY AND STENT PLACEMENT;  Surgeon: Robley Fries, MD;  Location: [facility];  Service: Urology;  Laterality: Left;  90 MINUTES   CYSTOSCOPY WITH RETROGRADE PYELOGRAM, URETEROSCOPY AND STENT PLACEMENT Left 04/07/2022   Procedure: CYSTOSCOPY WITH  RETROGRADE PYELOGRAM, DIAGNOSTIC URETEROSCOPY WITH BIOPSY AND STENT EXCHANGE;  Surgeon: Robley Fries, MD;  Location: [facility];  Service: Urology;  Laterality: Left;  75 MINUTES   KNEE ARTHROSCOPY Left 06/10/2011   PROSTATE BIOPSY  2000   TRANSURETHRAL RESECTION OF BLADDER TUMOR N/A 08/16/2017   Procedure: TRANSURETHRAL RESECTION OF BLADDER TUMOR (TURBT);  Surgeon: Cleon Gustin, MD;  Location: [facility];  Service: Urology;  Laterality: N/A;   TRANSURETHRAL RESECTION OF BLADDER TUMOR  12-09-2012   dr Alona [facility]ene [facility]   and TURP   TRANSURETHRAL RESECTION OF BLADDER TUMOR N/A 09/20/2017   Procedure: TRANSURETHRAL RESECTION OF BLADDER TUMOR (TURBT);  Surgeon: Cleon Gustin, MD;  Location: [facility];  Service: Urology;  Laterality: N/A;   TRANSURETHRAL RESECTION OF BLADDER TUMOR N/A 12/08/2019   Procedure: TRANSURETHRAL RESECTION OF BLADDER TUMOR;  Surgeon: Cleon Gustin, MD;  Location: [facility];  Service: Urology;  Laterality: N/A;  1 HR   TRANSURETHRAL RESECTION OF BLADDER TUMOR N/A 10/29/2020   Procedure: BLADDER FULGERATION;  Surgeon: Robley Fries, MD;  Location: [facility];  Service: Urology;  Laterality: N/A;  30 MINS   TRANSURETHRAL RESECTION OF BLADDER TUMOR N/A 07/08/2021   Procedure: TRANSURETHRAL RESECTION OF BLADDER TUMOR (TURBT)/FULGERATION;  Surgeon: Robley Fries, MD;  Location: [facility];  Service: Urology;  Laterality: N/A;  63 MINS   TRANSURETHRAL RESECTION OF BLADDER TUMOR N/A 12/02/2021   Procedure: TRANSURETHRAL RESECTION OF BLADDER TUMOR (TURBT);  Surgeon: Robley Fries, MD;  Location: [facility];  Service: Urology;   Laterality: N/A;   TRANSURETHRAL RESECTION OF PROSTATE  2011   UPPER GI ENDOSCOPY  YRS AGO    Allergies: Macrobid [nitrofurantoin], Ciprofloxacin, Tramadol hcl, and Cephalexin  Medications: Prior to Admission medications   Medication Sig Start Date End Date Taking? Authorizing Provider  acetaminophen (TYLENOL) 500 MG tablet Take 1,000 mg by mouth every 6 (six) hours as needed for moderate pain.   Yes [provider]  amLODipine (NORVASC) 10 MG tablet Take 1 tablet (10 mg total) by mouth daily. 08/23/20  Yes Mercy Riding, MD  docusate sodium (COLACE) 100 MG capsule Take 100 mg by mouth daily.   Yes [provider]  ferrous sulfate 325 (65 FE) MG tablet Take 325 mg by mouth daily with breakfast.   Yes [provider]  finasteride (PROSCAR) 5 MG tablet TAKE ONE TABLET BY MOUTH DAILY Patient taking differently: Take 5 mg by mouth at bedtime. 10/28/20  Yes McKenzie, Candee Furbish, MD  irbesartan (AVAPRO) 75 MG tablet Take 75 mg by mouth at bedtime.   Yes [provider]  levothyroxine (SYNTHROID, LEVOTHROID) 50 MCG tablet Take 50 mcg by mouth daily before breakfast. 12/25/15  Yes [provider]  mirabegron ER (MYRBETRIQ) 50 MG TB24 tablet Take 50 mg by mouth at bedtime.   Yes [provider]  oxyCODONE (ROXICODONE) 5 MG immediate release tablet Take 1 tablet (5 mg total) by mouth every 8 (eight) hours as needed. 04/07/22 04/07/23 Yes Pace, Johny Chess, MD  pantoprazole (PROTONIX) 20 MG tablet Take 20 mg by mouth at bedtime. 12/20/15  Yes [provider]  psyllium (METAMUCIL) 58.6 % powder Take 1 packet by mouth every evening.   Yes [provider]  rosuvastatin (CRESTOR) 5 MG tablet Take 5 mg by mouth at bedtime.   Yes [provider]  silodosin (RAPAFLO) 8 MG CAPS capsule TAKE ONE CAPSULE BY MOUTH EVERY NIGHT AT BEDTIME Patient taking differently: Take 8 mg by mouth at bedtime. 07/25/20  Yes McKenzie, Candee Furbish, MD  aspirin  81 MG tablet Take 81 mg by mouth at bedtime.     [provider]  oxycodone (OXY-IR) 5 MG capsule Take 5 mg by mouth every 4 (four) hours as needed.    [provider]     Family History  Problem Relation Age of Onset   Hypertension Mother    Breast cancer Mother    Other Mother        brain tumor   Alzheimer's disease Mother    Emphysema Father        smoker    Social History   Socioeconomic History   Marital status: Widowed    Spouse name: Not on file   Number of children: 3   Years of education: Not on file   Highest education level: Not on file  Occupational History   Occupation: retired  Tobacco Use   Smoking status: Never   Smokeless tobacco: Never  Vaping Use   Vaping Use: Never used  Substance and Sexual Activity   Alcohol use: Not Currently    Alcohol/week: 1.0 - 2.0 standard drink of alcohol    Types: 1 - 2 Glasses of wine per week    Comment: rare   Drug use: No   Sexual activity: Not Currently  Other Topics Concern   Not on file  Social History Narrative   Not on file   Social Determinants of Health   Financial Resource Strain: Not on file  Food Insecurity: Not on file  Transportation Needs: Not on file  Physical Activity: Not on file  Stress: Not on file  Social Connections: Not on file      Review of Systems currently denies fever, headache, chest pain, dyspnea, cough, back pain, nausea, vomiting.  He does have some right hip discomfort, intermittent pelvic discomfort and occasional blood-tinged urine  Vital Signs: BP (!) 152/78 (BP Location: Right Arm)   Pulse 80   Temp 97.8 F (36.6 C) (Oral)   Resp 18   Ht 5' 8.5" (1.74 m)   Wt 180 lb (81.6 kg)   SpO2 100%   BMI 26.97 kg/m   Physical Exam awake, alert.  Chest clear to auscultation bilaterally.  Heart with normal rate, occasional ectopy.  Abdomen soft, positive bowel sounds, nontender.  Trace to 1+ pretibial edema bilaterally.  Imaging: DG C-Arm 1-60 Min-No  Report  Result Date: 04/07/2022 Fluoroscopy was utilized by the requesting physician.  No radiographic interpretation.    Labs:  CBC: Recent Labs    07/08/21 1332 12/02/21 0730 03/23/22 0814 04/07/22 1143  WBC  --  6.7 7.9 8.7  HGB 12.2* 10.8* 10.8* 9.9*  HCT 36.0* 33.4* 32.8*  30.3*  PLT  --  281 364 287    COAGS: No results for input(s): "INR", "APTT" in the last 8760 hours.  BMP: Recent Labs    07/08/21 1332 12/02/21 0730 03/23/22 0814 04/07/22 1143  NA 142 138 138 138  K 4.5 4.0 4.3 4.2  CL 108 111 112* 111  CO2  --  20* 18* 17*  GLUCOSE 98 99 105* 103*  BUN 33* 38* 63* 44*  CALCIUM  --  8.8* 8.8* 8.9  CREATININE 2.90* 2.78* 3.42* 3.16*  GFRNONAA  --  21* 17* 18*    LIVER FUNCTION TESTS: No results for input(s): "BILITOT", "AST", "ALT", "ALKPHOS", "PROT", "ALBUMIN" in the last 8760 hours.  TUMOR MARKERS: No results for input(s): "AFPTM", "CEA", "CA199", "CHROMGRNA" in the last 8760 hours.  Assessment and Plan: 88 y.o. male with past medical history of anemia, arthritis, had asthma, BPH, chronic kidney disease, GERD, colon polyps, nephrolithiasis, hyperlipidemia, hypertension, sleep apnea, hypothyroidism, skin cancer, and bladder cancer in 2011, 2014, 2018 and 2021 with BCG therapy.   He now presents with history of gross hematuria and bloody efflux from the left ureteral orifice.  He underwent diagnostic ureteroscopy with ureteral washing 2 weeks ago.  On 04/07/2022 he underwent cystoscopy, left diagnostic ureteroscopy with ureteral biopsy, left ureteral stent exchange.  Pathology from proximal left ureter biopsy revealed low-grade papillary urothelial carcinoma.  He now presents for left percutaneous nephrostomy for jelmyto to treat upper tract urothelial carcinoma.Risks and benefits of left PCN placement was discussed with the patient including, but not limited to, infection, bleeding, significant bleeding causing loss or decrease in renal function or damage to  adjacent structures.   All of the patient's questions were answered, patient is agreeable to proceed.  Consent signed and in chart.      Thank you for this interesting consult.  I greatly enjoyed meeting [name] [name] and look forward to participating in their care.  A copy of this report was sent to the requesting provider on this date.  Electronically Signed: D. Rowe Robert, PA-C 05/06/2022, 9:03 AM   I spent a total of  25 minutes   in face to face in clinical consultation, greater than 50% of which was counseling/coordinating care for left percutaneous nephrostomy

## 2022-05-07 ENCOUNTER — Telehealth: Payer: Self-pay | Admitting: Student

## 2022-05-07 LAB — URINE CULTURE: Culture: NO GROWTH

## 2022-05-07 NOTE — Telephone Encounter (Signed)
Received a call from the patient regarding how to take care of the dressing on his new left PCN.   Mr. Hazard recently underwent cystoscopy, left diagnostic ureteroscopy with ureteral biopsy on 04/07/22, pathology from proximal left ureter biopsy revealed low-grade papillary urothelial carcinoma.   Patient is s/p left PCN placement with IR on yesterday 05/06/22.   Patient states that he is just little confused on how to take care of the dressing on his new kidney tube.  Informed the patient that the main goal is to keep the site where the tube is coming out dry and clean, and dressing is only needed to be changed when it is dirty/wet or at least twice a week. Patient verbalized understanding.   He also reports that there was a trickle of blood dripping from the PCN insertion site yesterday, but no further bleeding from the site was noted today.  Reports output from the left PCN had been dark blood, does not look like normal urine.  Patient denies s/s of acute bleeding such as lightheadedness, dizziness, chest palpitation, shortness of breath, or lethargy.  Patient states that he is scheduled for "treatment using the tube" with Dr. Claudia Desanctis on 05/13/22 (patient is scheduled for [facility] treatment. ) Patient states that he was told that he may have bloody output from the left kidney tube due to his cancer.  Per Dr. Malachi Carl procedure note yesterday, patient had dark sanguinous urine output from the left [facility] as well.  Informed that patient that the bloody appearance of the output is most likely due to his cancer, but encouraged him to reach out to his medical providers if he develops lightheadedness, dizziness, chest palpitation, shortness of breath, or lethargy in between now and his next appointment on 05/13/22.  Patient verbalized understanding.   Please call IR for questions and concerns, patient will be scheduled for routine left PCN exchange every 6-8 weeks with IR.    Armando Gang Gail Creekmore PA-C 05/07/2022  2:45 PM

## 2022-05-12 ENCOUNTER — Other Ambulatory Visit (HOSPITAL_COMMUNITY): Payer: Self-pay | Admitting: Adult Health

## 2022-05-12 DIAGNOSIS — C662 Malignant neoplasm of left ureter: Secondary | ICD-10-CM

## 2022-05-13 DIAGNOSIS — C642 Malignant neoplasm of left kidney, except renal pelvis: Secondary | ICD-10-CM | POA: Diagnosis not present

## 2022-05-13 DIAGNOSIS — C662 Malignant neoplasm of left ureter: Secondary | ICD-10-CM | POA: Diagnosis not present

## 2022-05-13 DIAGNOSIS — Z8551 Personal history of malignant neoplasm of bladder: Secondary | ICD-10-CM | POA: Diagnosis not present

## 2022-05-13 DIAGNOSIS — N2889 Other specified disorders of kidney and ureter: Secondary | ICD-10-CM | POA: Diagnosis not present

## 2022-05-13 DIAGNOSIS — K5730 Diverticulosis of large intestine without perforation or abscess without bleeding: Secondary | ICD-10-CM | POA: Diagnosis not present

## 2022-05-15 ENCOUNTER — Other Ambulatory Visit (HOSPITAL_COMMUNITY): Payer: Self-pay | Admitting: Adult Health

## 2022-05-15 ENCOUNTER — Ambulatory Visit (HOSPITAL_COMMUNITY)
Admission: RE | Admit: 2022-05-15 | Discharge: 2022-05-15 | Disposition: A | Discharge status: Home / Self Care | Payer: Medicare Other | Source: Ambulatory Visit | Attending: Adult Health | Admitting: Adult Health

## 2022-05-15 DIAGNOSIS — C662 Malignant neoplasm of left ureter: Secondary | ICD-10-CM | POA: Diagnosis not present

## 2022-05-15 DIAGNOSIS — T83012A Breakdown (mechanical) of nephrostomy catheter, initial encounter: Secondary | ICD-10-CM | POA: Diagnosis not present

## 2022-05-15 DIAGNOSIS — Z436 Encounter for attention to other artificial openings of urinary tract: Secondary | ICD-10-CM | POA: Insufficient documentation

## 2022-05-15 HISTORY — PX: IR NEPHROSTOMY EXCHANGE LEFT: IMG6069

## 2022-05-15 MED ORDER — IOHEXOL 300 MG/ML  SOLN
50.0000 mL | Freq: Once | INTRAMUSCULAR | Status: AC | PRN
  Administered 2022-05-15: 20 mL

## 2022-05-15 MED ORDER — LIDOCAINE HCL 1 % IJ SOLN
INTRAMUSCULAR | Status: AC
  Filled 2022-05-15: qty 20

## 2022-05-20 DIAGNOSIS — C662 Malignant neoplasm of left ureter: Secondary | ICD-10-CM | POA: Diagnosis not present

## 2022-05-28 DIAGNOSIS — N130 Hydronephrosis with ureteropelvic junction obstruction: Secondary | ICD-10-CM | POA: Diagnosis not present

## 2022-05-28 DIAGNOSIS — C662 Malignant neoplasm of left ureter: Secondary | ICD-10-CM | POA: Diagnosis not present

## 2022-05-29 DIAGNOSIS — C662 Malignant neoplasm of left ureter: Secondary | ICD-10-CM | POA: Diagnosis not present

## 2022-06-01 ENCOUNTER — Ambulatory Visit: Payer: Medicare Other | Admitting: Dermatology

## 2022-06-03 DIAGNOSIS — C662 Malignant neoplasm of left ureter: Secondary | ICD-10-CM | POA: Diagnosis not present

## 2022-06-10 DIAGNOSIS — C662 Malignant neoplasm of left ureter: Secondary | ICD-10-CM | POA: Diagnosis not present

## 2022-06-10 DIAGNOSIS — L270 Generalized skin eruption due to drugs and medicaments taken internally: Secondary | ICD-10-CM | POA: Diagnosis not present

## 2022-06-16 ENCOUNTER — Encounter (HOSPITAL_BASED_OUTPATIENT_CLINIC_OR_DEPARTMENT_OTHER): Payer: Self-pay | Admitting: Cardiology

## 2022-06-16 ENCOUNTER — Ambulatory Visit (INDEPENDENT_AMBULATORY_CARE_PROVIDER_SITE_OTHER): Payer: Medicare Other | Admitting: Cardiology

## 2022-06-16 VITALS — BP 138/62 | HR 86 | Ht 68.0 in | Wt 177.5 lb

## 2022-06-16 DIAGNOSIS — I10 Essential (primary) hypertension: Secondary | ICD-10-CM | POA: Diagnosis not present

## 2022-06-16 DIAGNOSIS — E785 Hyperlipidemia, unspecified: Secondary | ICD-10-CM | POA: Diagnosis not present

## 2022-06-16 DIAGNOSIS — G4730 Sleep apnea, unspecified: Secondary | ICD-10-CM | POA: Diagnosis not present

## 2022-06-16 DIAGNOSIS — C679 Malignant neoplasm of bladder, unspecified: Secondary | ICD-10-CM | POA: Diagnosis not present

## 2022-06-16 DIAGNOSIS — N184 Chronic kidney disease, stage 4 (severe): Secondary | ICD-10-CM | POA: Diagnosis not present

## 2022-06-16 NOTE — Progress Notes (Signed)
Cardiology Office Note:    Date:  06/16/2022   ID:  RAYON MCCHRISTIAN, DOB [birth year], MRN [id number]  PCP:  [name], MD  Cardiologist:  Buford Dresser, MD  Referring MD: [name], MD   CC: follow up  History of Present Illness:    STAR CHEESE is a 88 y.o. male with a hx of sleep apnea on BiPAP, bladder cancer who is seen for follow up today. I initially met him 04/10/20  as a new consult at the request of [name], MD for the evaluation and management of lightheadedness and palpitations.  Prior urgent appt to discuss ER visit 04/11/21. Note reviewed. hsTn 6, 8. VQ scan without evidence on PE. ESR 42, CRP 3.2.  Cr up to 2.84, baseline around 2. Initially he had a slowly growing discomfort in his central chest, had difficulty taking a deep breath. Was constant, continued to worsen over several days. Had neck, jaw, tooth pain as well. Had severe headache, couldn't bend over. Went to ER. Started to feel better in the ER, all symptoms disappeared within 1-2 days of leaving the ER. He stated it felt like when he had asthma when he was very young. No fevers/chills, no cough or mucus.  At his last appointment he felt back to baseline. He continued to have chronic loss of leg strength, though still able to walk over a mile in his neighborhood every day. About once/day had extreme fatigue, needed to nap briefly. He was using his CPAP. Reviewed prior ETT, nuclear stress test results. After shared decision making, no further testing was pursued.  Today: He states that he is feeling pretty good. However, he is not eating well and suffering from low energy and decreased stamina. He also has shortness of breath which he associates with his lack of stamina. Prior to his recent chemotherapy, he was walking several miles a day. At this time he is unable to walk upstairs without using the banister.   Currently on chemotherapy via nephrostomy tube; tomorrow is  his 5th session (once per week). He reports that there has been some improvement. When he takes Oxycodone, he only needs 1 or 2 tablets at the most.  Lately, he has noticed that urination from his right kidney is clear, which he notes is much improved from his prior hematuria.  In the last few months he has noticed worsening LE edema. He does have compression socks at home to wear.  He denies any palpitations, or chest pain. No lightheadedness, headaches, syncope, orthopnea, or PND.   Past Medical History:  Diagnosis Date   Anemia    Arthritis    lower back bulging disc, hips, knees, thumbs, shoulder   Asthma    as a pre teen   Bladder cancer ([place]) UROLOGIST-  DR MCKENZIE   S/P TURBT 08-16-2017   BPH (benign prostatic hyperplasia)    CKD (chronic kidney disease), stage III ([place])    STAGE 3  B CKD per lov 10-16-2020 dr Rica Mast Jeri [facility] kidney on chart   Fatigue    MIDDLE OF DAY ON OCCASION   Frequency of urination    GERD (gastroesophageal reflux disease)    on protonix   Hematuria    Hematuria    History of colon polyps    History of kidney stones    HLD (hyperlipidemia)    HOH (hard of hearing)    slightly hoh right worse thanleft   HTN (hypertension)    Hypothyroidism    Incontinence  of urine    weras pads all the time   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   Right Buccal Cheek   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-- urologist- dr Alyson Ingles   s/p  TURBT 08-16-2017  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   Sleep apnea    uses cpap set on 20 /4   Trigger finger of left hand 07/04/2021   last few months per pt   UTI (urinary tract infection) 08/20/2020   UTI (urinary tract infection)    finished ampicillin on 06-13-2021 all symptoms resolved   Weakness of extremity    left knee   Wears glasses     Past Surgical History:  Procedure Laterality Date   BCG X 2     AFTER LAST 2 BLADDER TUMOR REMOVALS    CATARACT EXTRACTION, BILATERAL     COLONSCOPY  YRS AGO   CYSTOSCOPY N/A 10/29/2020   Procedure: CYSTOSCOPY;  Surgeon: Robley Fries, MD;  Location: [place];  Service: Urology;  Laterality: N/A;   CYSTOSCOPY N/A 07/08/2021   Procedure: CYSTOSCOPY;  Surgeon: Robley Fries, MD;  Location: [facility];  Service: Urology;  Laterality: N/A;   CYSTOSCOPY W/ URETERAL STENT PLACEMENT Bilateral 08/16/2017   Procedure: CYSTOSCOPY WITH RETROGRADE PYELOGRAM/URETERAL STENT PLACEMENT;  Surgeon: Cleon Gustin, MD;  Location: [facility];  Service: Urology;  Laterality: Bilateral;   CYSTOSCOPY WITH BIOPSY N/A 06/25/2020   Procedure: CYSTOSCOPY FULGURATION   BLADDER  BIOPSY;  Surgeon: Robley Fries, MD;  Location: [facility];  Service: Urology;  Laterality: N/A;   CYSTOSCOPY WITH FULGERATION N/A 12/08/2019   Procedure: CYSTOSCOPY WITH FULGERATION;  Surgeon: Cleon Gustin, MD;  Location: [facility];  Service: Urology;  Laterality: N/A;   CYSTOSCOPY WITH FULGERATION N/A 03/24/2022   Procedure: CYSTOSCOPY WITH FULGERATION;  Surgeon: Robley Fries, MD;  Location: [facility];  Service: Urology;  Laterality: N/A;   CYSTOSCOPY WITH RETROGRADE PYELOGRAM, URETEROSCOPY AND STENT PLACEMENT Left 03/24/2022   Procedure: CYSTOSCOPY WITH RETROGRADE PYELOGRAM, DIAGNOSTIC URETEROSCOPY WITH BIOPSY AND STENT PLACEMENT;  Surgeon: Robley Fries, MD;  Location: [facility];  Service: Urology;  Laterality: Left;  90 MINUTES   CYSTOSCOPY WITH RETROGRADE PYELOGRAM, URETEROSCOPY AND STENT PLACEMENT Left 04/07/2022   Procedure: CYSTOSCOPY WITH  RETROGRADE PYELOGRAM, DIAGNOSTIC URETEROSCOPY WITH BIOPSY AND STENT EXCHANGE;  Surgeon: Robley Fries, MD;  Location: [facility];  Service: Urology;  Laterality: Left;  75 MINUTES   IR NEPHROSTOMY EXCHANGE LEFT  05/15/2022   IR NEPHROSTOMY PLACEMENT LEFT  05/06/2022   KNEE ARTHROSCOPY Left 06/10/2011    PROSTATE BIOPSY  2000   TRANSURETHRAL RESECTION OF BLADDER TUMOR N/A 08/16/2017   Procedure: TRANSURETHRAL RESECTION OF BLADDER TUMOR (TURBT);  Surgeon: Cleon Gustin, MD;  Location: [facility];  Service: Urology;  Laterality: N/A;   TRANSURETHRAL RESECTION OF BLADDER TUMOR  12-09-2012   dr Alona Bene [facility]   and TURP   TRANSURETHRAL RESECTION OF BLADDER TUMOR N/A 09/20/2017   Procedure: TRANSURETHRAL RESECTION OF BLADDER TUMOR (TURBT);  Surgeon: Cleon Gustin, MD;  Location: [facility];  Service: Urology;  Laterality: N/A;   TRANSURETHRAL RESECTION OF BLADDER TUMOR N/A 12/08/2019   Procedure: TRANSURETHRAL RESECTION OF BLADDER TUMOR;  Surgeon: Cleon Gustin, MD;  Location: [facility];  Service: Urology;  Laterality: N/A;  1 HR   TRANSURETHRAL RESECTION OF BLADDER TUMOR N/A 10/29/2020  Procedure: BLADDER FULGERATION;  Surgeon: Robley Fries, MD;  Location: [facility];  Service: Urology;  Laterality: N/A;  30 MINS   TRANSURETHRAL RESECTION OF BLADDER TUMOR N/A 07/08/2021   Procedure: TRANSURETHRAL RESECTION OF BLADDER TUMOR (TURBT)/FULGERATION;  Surgeon: Robley Fries, MD;  Location: [facility];  Service: Urology;  Laterality: N/A;  46 MINS   TRANSURETHRAL RESECTION OF BLADDER TUMOR N/A 12/02/2021   Procedure: TRANSURETHRAL RESECTION OF BLADDER TUMOR (TURBT);  Surgeon: Robley Fries, MD;  Location: [facility];  Service: Urology;  Laterality: N/A;   TRANSURETHRAL RESECTION OF PROSTATE  2011   UPPER GI ENDOSCOPY  YRS AGO    Current Medications: Current Outpatient Medications on File Prior to Visit  Medication Sig   acetaminophen (TYLENOL) 500 MG tablet Take 1,000 mg by mouth every 6 (six) hours as needed for moderate pain.   amLODipine (NORVASC) 10 MG tablet Take 1 tablet (10 mg total) by mouth daily.   aspirin 81 MG tablet Take 81 mg by mouth at bedtime.    docusate sodium (COLACE) 100  MG capsule Take 100 mg by mouth daily.   ferrous sulfate 325 (65 FE) MG tablet Take 325 mg by mouth daily with breakfast.   finasteride (PROSCAR) 5 MG tablet TAKE ONE TABLET BY MOUTH DAILY (Patient taking differently: Take 5 mg by mouth at bedtime.)   irbesartan (AVAPRO) 75 MG tablet Take 75 mg by mouth at bedtime.   levothyroxine (SYNTHROID, LEVOTHROID) 50 MCG tablet Take 50 mcg by mouth daily before breakfast.   mirabegron ER (MYRBETRIQ) 50 MG TB24 tablet Take 50 mg by mouth at bedtime.   oxyCODONE (ROXICODONE) 5 MG immediate release tablet Take 1 tablet (5 mg total) by mouth every 8 (eight) hours as needed.   pantoprazole (PROTONIX) 20 MG tablet Take 20 mg by mouth at bedtime.   psyllium (METAMUCIL) 58.6 % powder Take 1 packet by mouth every evening.   rosuvastatin (CRESTOR) 5 MG tablet Take 5 mg by mouth at bedtime.   silodosin (RAPAFLO) 8 MG CAPS capsule TAKE ONE CAPSULE BY MOUTH EVERY NIGHT AT BEDTIME (Patient taking differently: Take 8 mg by mouth at bedtime.)   No current facility-administered medications on file prior to visit.     Allergies:   Macrobid [nitrofurantoin], Ciprofloxacin, Tramadol hcl, and Cephalexin   Social History   Tobacco Use   Smoking status: Never   Smokeless tobacco: Never  Vaping Use   Vaping Use: Never used  Substance Use Topics   Alcohol use: Not Currently    Alcohol/week: 1.0 - 2.0 standard drink of alcohol    Types: 1 - 2 Glasses of wine per week    Comment: rare   Drug use: No    Family History: family history includes Alzheimer's disease in his mother; Breast cancer in his mother; Emphysema in his father; Hypertension in his mother; Other in his mother. son had "veins cleaned out" 03/2020. Brother with history of heart disease. Mother and mat gma had heart issues as well.  ROS:   Please see the history of present illness.   (+) Fatigue/low stamina (+) Shortness of breath (+) Bilateral LE edema Additional pertinent ROS otherwise  unremarkable.   EKGs/Labs/Other Studies Reviewed:    The following studies were reviewed today:  Lexiscan nuclear stress test 08/22/20 IMPRESSION: 1. No reversible ischemia or infarction.   2. Normal left ventricular wall motion.   3. Left ventricular ejection fraction 61%   4. Non invasive risk stratification*: Low  Echo 05/03/20  1. Left ventricular ejection fraction, by estimation, is 55 to 60%. The  left ventricle has normal function. The left ventricle has no regional  wall motion abnormalities. There is mild concentric left ventricular  hypertrophy. Left ventricular diastolic  parameters were normal.   2. Right ventricular systolic function is normal. The right ventricular  size is normal.   3. Left atrial size was mildly dilated.   4. The mitral valve is normal in structure. Trivial mitral valve  regurgitation. No evidence of mitral stenosis.   5. The aortic valve is tricuspid. Aortic valve regurgitation is mild.  Mild aortic valve sclerosis is present, with no evidence of aortic valve  stenosis.   6. There is mild dilatation of the ascending aorta measuring 39 mm.   7. The inferior vena cava is normal in size with greater than 50%  respiratory variability, suggesting right atrial pressure of 3 mmHg.   ETT 05/24/20 Blood pressure demonstrated a hypertensive response to exercise. Upsloping ST segment depression ST segment depression was noted during stress in the III, II, aVF, V6, V5 and V4 leads. Negative, adequate stress test. Baseline Vitals  Rest HR 71 bpm    Rest BP 161/83 mmHg    Exercise Time  Exercise duration (min) 6 min    Exercise duration (sec) 0 sec    Peak Stress Vitals  Peak HR 144 bpm    Peak BP 217/89 mmHg    Exercise Data  MPHR 135 bpm    Percent HR 106 %    Estimated workload 7 METS     EKG:  Personally reviewed. 06/16/2022:  NSR at 86 bpm 05/01/2021: NSR at 67 bpm 08/20/20: sinus tachycardia at 105 bpm  Recent Labs: 05/06/2022: BUN  55; Creatinine, Ser 2.94; Hemoglobin 10.6; Platelets 249; Potassium 4.3; Sodium 143   Recent Lipid Panel    Component Value Date/Time   CHOL 117 08/20/2020 1908   TRIG 54 08/20/2020 1908   HDL 43 08/20/2020 1908   CHOLHDL 2.7 08/20/2020 1908   VLDL 11 08/20/2020 1908   LDLCALC 63 08/20/2020 1908    Physical Exam:    VS:  BP 138/62 (BP Location: Left Arm, Patient Position: Sitting, Cuff Size: Normal)   Pulse 86   Ht _0  (1.727 m)   Wt 177 lb 8 oz (80.5 kg)   BMI 26.99 kg/m     Wt Readings from Last 3 Encounters:  06/16/22 177 lb 8 oz (80.5 kg)  05/06/22 180 lb (81.6 kg)  04/07/22 179 lb 3.7 oz (81.3 kg)    GEN: Well nourished, well developed in no acute distress HEENT: Normal, moist mucous membranes NECK: No JVD CARDIAC: regular rhythm, normal S1 and S2, no rubs or gallops. No murmur. VASCULAR: Radial and DP pulses 2+ bilaterally. No carotid bruits RESPIRATORY:  Clear to auscultation without rales, wheezing or rhonchi  ABDOMEN: Soft, non-tender, non-distended MUSCULOSKELETAL:  Ambulates independently SKIN: Warm and dry, 1+ LE edema bilaterally NEUROLOGIC:  Alert and oriented x 3. No focal neuro deficits noted.  PSYCHIATRIC:  Normal affect   ASSESSMENT:    1. Essential hypertension   2. Sleep apnea treated with nocturnal BiPAP   3. Malignant neoplasm of urinary bladder, unspecified site ([place])   4. Hyperlipidemia, unspecified hyperlipidemia type      PLAN:    Prior chest pain: resolved, no evidence of ischemia on nuclear imaging  Bladder cancer: undergoing chemo, has fatigue and LE edema -reviewed red flag signs that are concerning for heart  issues  Hyperlipidemia, unspecified: -continue rosuvastatin  Hypertension:  -continue irbesartan -continue amlodipine, may worsen LE edema but counseled on compression/elevation  Sleep apnea: continue treatment  Cardiac risk counseling and prevention recommendations: -recommend heart healthy/Mediterranean diet, with  whole grains, fruits, vegetable, fish, lean meats, nuts, and olive oil. Limit salt. -recommend moderate walking, 3-5 times/week for 30-50 minutes each session. Aim for at least 150 minutes.week. Goal should be pace of 3 miles/hours, or walking 1.5 miles in 30 minutes -recommend avoidance of tobacco products. Avoid excess alcohol.  -on aspirin for primary prevention, on hold for procedure. No issues with this. We discussed stopping aspirin given age, indication, but he would like to continue  Plan for follow up: 1 year or sooner as needed  Buford Dresser, MD, PhD, [facility]    Medication Adjustments/Labs and Tests Ordered: Current medicines are reviewed at length with the patient today.  Concerns regarding medicines are outlined above.   No orders of the defined types were placed in this encounter.  No orders of the defined types were placed in this encounter.  There are no Patient Instructions on file for this visit.   I,Mathew Stumpf,acting as a Education administrator for [employer], MD.,have documented all relevant documentation on the behalf of Buford Dresser, MD,as directed by  Buford Dresser, MD while in the presence of Buford Dresser, MD.  I, Buford Dresser, MD, have reviewed all documentation for this visit. The documentation on 07/31/22 for the exam, diagnosis, procedures, and orders are all accurate and complete.   Signed, Buford Dresser, MD PhD 06/16/2022    [place]

## 2022-06-16 NOTE — Patient Instructions (Signed)
Medication Instructions:  ?Your Physician recommend you continue on your current medication as directed.   ? ?*If you need a refill on your cardiac medications before your next appointment, please call your pharmacy* ? ?Follow-Up: ?At [facility], you and your health needs are our priority.  As part of our continuing mission to provide you with exceptional heart care, we have created designated Provider Care Teams.  These Care Teams include your primary Cardiologist (physician) and Advanced Practice Providers (APPs -  Physician Assistants and Nurse Practitioners) who all work together to provide you with the care you need, when you need it. ? ?We recommend signing up for the patient portal called "MyChart".  Sign up information is provided on this After Visit Summary.  MyChart is used to connect with patients for Virtual Visits (Telemedicine).  Patients are able to view lab/test results, encounter notes, upcoming appointments, etc.  Non-urgent messages can be sent to your provider as well.   ?To learn more about what you can do with MyChart, go to [email or web address].   ? ?Your next appointment:   ?1 year(s) ? ?The format for your next appointment:   ?In Person ? ?Provider:   ?Bridgette Christopher, MD{ ? ?

## 2022-06-17 DIAGNOSIS — C662 Malignant neoplasm of left ureter: Secondary | ICD-10-CM | POA: Diagnosis not present

## 2022-06-19 DIAGNOSIS — R319 Hematuria, unspecified: Secondary | ICD-10-CM | POA: Diagnosis not present

## 2022-06-19 DIAGNOSIS — R809 Proteinuria, unspecified: Secondary | ICD-10-CM | POA: Diagnosis not present

## 2022-06-19 DIAGNOSIS — C679 Malignant neoplasm of bladder, unspecified: Secondary | ICD-10-CM | POA: Diagnosis not present

## 2022-06-19 DIAGNOSIS — D509 Iron deficiency anemia, unspecified: Secondary | ICD-10-CM | POA: Diagnosis not present

## 2022-06-19 DIAGNOSIS — N2581 Secondary hyperparathyroidism of renal origin: Secondary | ICD-10-CM | POA: Diagnosis not present

## 2022-06-19 DIAGNOSIS — N184 Chronic kidney disease, stage 4 (severe): Secondary | ICD-10-CM | POA: Diagnosis not present

## 2022-06-19 DIAGNOSIS — I129 Hypertensive chronic kidney disease with stage 1 through stage 4 chronic kidney disease, or unspecified chronic kidney disease: Secondary | ICD-10-CM | POA: Diagnosis not present

## 2022-06-19 DIAGNOSIS — E875 Hyperkalemia: Secondary | ICD-10-CM | POA: Diagnosis not present

## 2022-06-22 DIAGNOSIS — E782 Mixed hyperlipidemia: Secondary | ICD-10-CM | POA: Diagnosis not present

## 2022-06-22 DIAGNOSIS — E039 Hypothyroidism, unspecified: Secondary | ICD-10-CM | POA: Diagnosis not present

## 2022-06-22 DIAGNOSIS — N1832 Chronic kidney disease, stage 3b: Secondary | ICD-10-CM | POA: Diagnosis not present

## 2022-06-22 DIAGNOSIS — I129 Hypertensive chronic kidney disease with stage 1 through stage 4 chronic kidney disease, or unspecified chronic kidney disease: Secondary | ICD-10-CM | POA: Diagnosis not present

## 2022-06-24 DIAGNOSIS — C662 Malignant neoplasm of left ureter: Secondary | ICD-10-CM | POA: Diagnosis not present

## 2022-06-30 DIAGNOSIS — C662 Malignant neoplasm of left ureter: Secondary | ICD-10-CM | POA: Diagnosis not present

## 2022-07-10 ENCOUNTER — Ambulatory Visit (HOSPITAL_COMMUNITY)
Admission: RE | Admit: 2022-07-10 | Discharge: 2022-07-10 | Disposition: A | Discharge status: Home / Self Care | Payer: Medicare Other | Source: Ambulatory Visit | Attending: Adult Health | Admitting: Adult Health

## 2022-07-10 ENCOUNTER — Other Ambulatory Visit (HOSPITAL_COMMUNITY): Payer: Self-pay | Admitting: Interventional Radiology

## 2022-07-10 DIAGNOSIS — Z436 Encounter for attention to other artificial openings of urinary tract: Secondary | ICD-10-CM | POA: Diagnosis not present

## 2022-07-10 DIAGNOSIS — C662 Malignant neoplasm of left ureter: Secondary | ICD-10-CM

## 2022-07-10 DIAGNOSIS — Z466 Encounter for fitting and adjustment of urinary device: Secondary | ICD-10-CM | POA: Diagnosis not present

## 2022-07-10 HISTORY — PX: IR NEPHROSTOMY EXCHANGE LEFT: IMG6069

## 2022-07-10 MED ORDER — LIDOCAINE HCL 1 % IJ SOLN
INTRAMUSCULAR | Status: AC
  Filled 2022-07-10: qty 20

## 2022-07-10 MED ORDER — IOHEXOL 300 MG/ML  SOLN
50.0000 mL | Freq: Once | INTRAMUSCULAR | Status: AC | PRN
Start: 2022-07-10 — End: 2022-07-10
  Administered 2022-07-10: 5 mL

## 2022-07-13 DIAGNOSIS — G4733 Obstructive sleep apnea (adult) (pediatric): Secondary | ICD-10-CM | POA: Diagnosis not present

## 2022-07-13 DIAGNOSIS — G4737 Central sleep apnea in conditions classified elsewhere: Secondary | ICD-10-CM | POA: Diagnosis not present

## 2022-07-16 IMAGING — US IR NEPHROSTOMY PLACEMENT LEFT
1 series · 1 of 1 positions shown · non-contrast
Comparison: None Available.

INDICATION: 89-year-old male with history proximal left ureteral mass presenting
for percutaneous nephrostomy for urinary diversion and planned
[name] administration.

EXAM:
1. ULTRASOUND GUIDANCE FOR PUNCTURE OF THE LEFT RENAL COLLECTING
SYSTEM
2. LEFT PERCUTANEOUS NEPHROSTOMY TUBE PLACEMENT.

[Series 1: ir nephrostomy placement left · 1 of 1 slices shown]
[im 1/1]
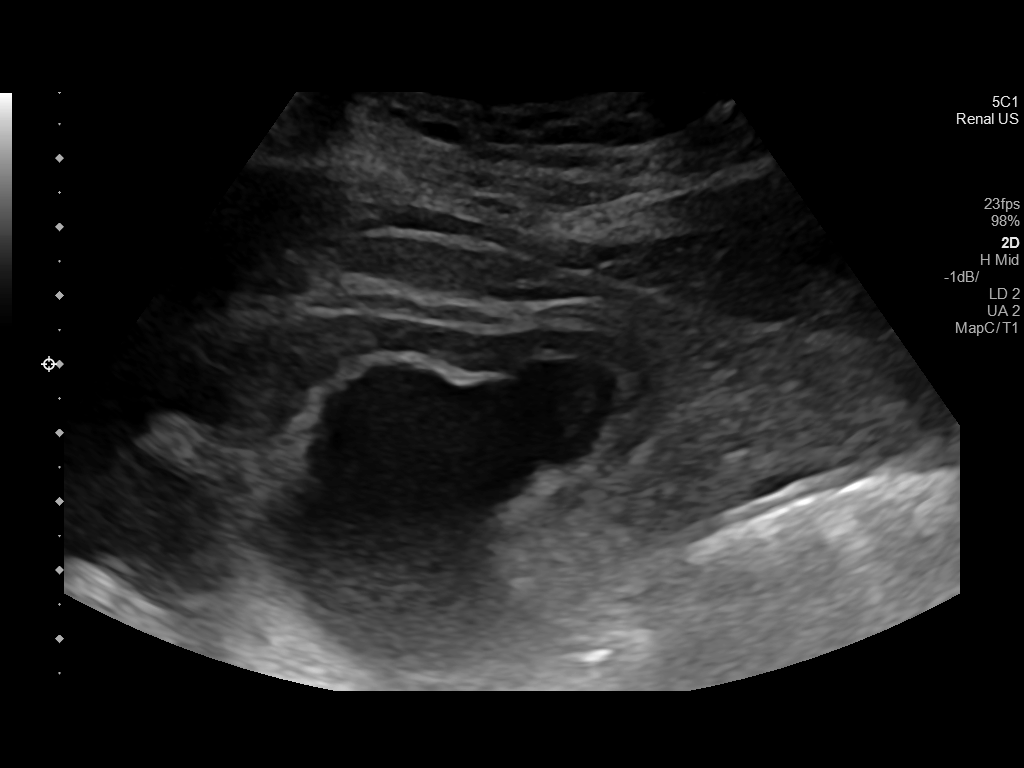

[1 of 1 positions shown; findings below may reference images not displayed]

MEDICATIONS:
Levofloxacin 500 mg, intravenous; The antibiotic was administered in
an appropriate time frame prior to skin puncture.

ANESTHESIA/SEDATION:
Moderate (conscious) sedation was employed during this procedure. A
total of Versed 2 mg and Fentanyl 100 mcg was administered
intravenously.

Moderate Sedation Time: 15 minutes. The patient's level of
consciousness and vital signs were monitored continuously by
radiology nursing throughout the procedure under my direct
supervision.

CONTRAST:  Five mL Isovue 300 - administered into the renal
collecting system

FLUOROSCOPY TIME:  2.0 minutes, 12 mGy

COMPLICATIONS:
None immediate.

PROCEDURE:
The procedure, risks, benefits, and alternatives were explained to
the patient. Questions regarding the procedure were encouraged and
answered. The patient understands and consents to the procedure. A
timeout was performed prior to the initiation of the procedure.

The left flank region was prepped and draped in the usual sterile
fashion and a sterile drape was applied covering the operative
field. A sterile gown and sterile gloves were used for the
procedure. Local anesthesia was provided with 1% Lidocaine with
epinephrine. Ultrasound was used to localize the left kidney. Under
direct ultrasound guidance, a 20 gauge needle was advanced into the
renal collecting system. An ultrasound image documentation was
performed. Access within the collecting system was confirmed with
the efflux of dark bloody urine followed by limited contrast
injection.

Over a Nitrex wire, the tract was dilated with an Accustick stent.
Next, under intermittent fluoroscopic guidance and over [place],
the track was dilated ultimately allowing placement of a
percutaneous nephrostomy catheter which was advanced to the level of
the renal pelvis where the coil was formed and locked. Contrast was
injected and several spot fluoroscopic images were obtained in
various obliquities. The catheter was secured at the skin with a 0
silk retention suture and stat lock device and connected to a
gravity bag was placed. Dressings were applied. The patient
tolerated procedure well without immediate postprocedural
complication.
FINDINGS: Ultrasound scanning demonstrates a moderate to severely dilated left
collecting system with moderate to severe diffuse cortical thinning.
Indwelling double-J nephroureteral stent remains well positioned in
the proximal ureter versus distal renal pelvis. There is no evidence
of injected contrast traversing the renal pelvis into the ureter.

Under a combination of ultrasound and fluoroscopic guidance, a
posterior inferior calix was targeted allowing placement of a
percutaneous nephrostomy catheter with end coiled and locked within
the renal pelvis. Contrast injection confirmed appropriate
positioning.
IMPRESSION: 1. Successful ultrasound and fluoroscopic guided placement of a left
sided 10.2 French PCN.
2. Occlusion of the distal renal pelvis/proximal ureter. Despite
proximity of newly placed nephrostomy and indwelling nephroureteral
stent, there is no definite evidence of communication between these
2 spaces, implying malignant obstruction.

## 2022-07-23 ENCOUNTER — Other Ambulatory Visit: Payer: Self-pay | Admitting: Urology

## 2022-07-23 DIAGNOSIS — R7309 Other abnormal glucose: Secondary | ICD-10-CM | POA: Diagnosis not present

## 2022-07-23 DIAGNOSIS — E039 Hypothyroidism, unspecified: Secondary | ICD-10-CM | POA: Diagnosis not present

## 2022-07-23 DIAGNOSIS — I10 Essential (primary) hypertension: Secondary | ICD-10-CM | POA: Diagnosis not present

## 2022-07-23 DIAGNOSIS — I700 Atherosclerosis of aorta: Secondary | ICD-10-CM | POA: Diagnosis not present

## 2022-07-23 DIAGNOSIS — R600 Localized edema: Secondary | ICD-10-CM | POA: Diagnosis not present

## 2022-07-23 DIAGNOSIS — I129 Hypertensive chronic kidney disease with stage 1 through stage 4 chronic kidney disease, or unspecified chronic kidney disease: Secondary | ICD-10-CM | POA: Diagnosis not present

## 2022-07-23 DIAGNOSIS — N1832 Chronic kidney disease, stage 3b: Secondary | ICD-10-CM | POA: Diagnosis not present

## 2022-07-23 DIAGNOSIS — N184 Chronic kidney disease, stage 4 (severe): Secondary | ICD-10-CM | POA: Diagnosis not present

## 2022-07-23 DIAGNOSIS — E782 Mixed hyperlipidemia: Secondary | ICD-10-CM | POA: Diagnosis not present

## 2022-07-30 DIAGNOSIS — I700 Atherosclerosis of aorta: Secondary | ICD-10-CM | POA: Diagnosis not present

## 2022-07-30 DIAGNOSIS — E039 Hypothyroidism, unspecified: Secondary | ICD-10-CM | POA: Diagnosis not present

## 2022-07-30 DIAGNOSIS — D508 Other iron deficiency anemias: Secondary | ICD-10-CM | POA: Diagnosis not present

## 2022-07-30 DIAGNOSIS — E782 Mixed hyperlipidemia: Secondary | ICD-10-CM | POA: Diagnosis not present

## 2022-07-30 DIAGNOSIS — R5383 Other fatigue: Secondary | ICD-10-CM | POA: Diagnosis not present

## 2022-07-30 DIAGNOSIS — Z0000 Encounter for general adult medical examination without abnormal findings: Secondary | ICD-10-CM | POA: Diagnosis not present

## 2022-07-30 DIAGNOSIS — I10 Essential (primary) hypertension: Secondary | ICD-10-CM | POA: Diagnosis not present

## 2022-07-30 DIAGNOSIS — I129 Hypertensive chronic kidney disease with stage 1 through stage 4 chronic kidney disease, or unspecified chronic kidney disease: Secondary | ICD-10-CM | POA: Diagnosis not present

## 2022-07-30 DIAGNOSIS — N184 Chronic kidney disease, stage 4 (severe): Secondary | ICD-10-CM | POA: Diagnosis not present

## 2022-07-30 DIAGNOSIS — D649 Anemia, unspecified: Secondary | ICD-10-CM | POA: Diagnosis not present

## 2022-07-30 DIAGNOSIS — R600 Localized edema: Secondary | ICD-10-CM | POA: Diagnosis not present

## 2022-08-05 NOTE — H&P (Signed)
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 He is here today for 2nd [name] instillation. Patient's feet have been swelling more than usual. He is not set to see his nephrologist until after he completes the [facility]. Overall his energy has been lower and he has been feeling weaker.   05/29/2022: Tom returns today after second [name] instillation yesterday. His nephrostomy tube had no output after being hooked up to a bag. He started to experience severe pain overnight. He did not have any pain for several hours and even passed some grayish-purple material.   06/03/2022: [name] presents today for third [name] instillation. Currently, doing well without any complaints of pain. No fevers or chills.   06/10/22: He presents today for [name] instillation 4/6. He currently reports a rash that begin on his back and chest and has spread to his extremitis. Benadyl is not helping. He denies fevers. The rash is extremely pruritic. No oral lesions.   06/17/2022: He presents today for [name] instillation #5 of 6. Currently doing much better after treatment with steroids for his rash. He reports the itching has subsided. His potassium was elevated and his kidney doctor placed him on a medication to decrease his potassium levels.   06/24/2022: 88 year old man who presents for [name] instillation #6 of 6.   06/30/2022: 88 year old man who presents today for dressing change of his PCN tube and reassessment. Currently working on getting antegrade nephrogram to ensure his left kidney is draining appropriately prior to pulling his PCN tube   : Cipro TABS - Diarrhea, **Diarrhea if taking '750mg'$ , '500mg'$  ok to take** Macrobid - Respiratory Distress    MEDICATIONS: Finasteride 5 mg tablet 1 tablet PO Daily  Levothyroxine Sodium 50 mcg tablet   Myrbetriq 50 mg tablet, extended release 24 hr 1 tablet PO Daily  Amlodipine Besylate 10 mg tablet tablet  Aspirin Ec 81 mg tablet, delayed release  Ferrous Fumarate  Irbesartan 300 mg tablet  Metamucil  Oxycodone Hcl 5 mg tablet 1 tablet PO Q 6 H PRN severe postop pain  Pantoprazole Sodium 40 mg tablet, delayed release  Rosuvastatin Calcium 5 mg tablet  Silodosin 8 mg capsule  Vitamin D3  Zinc 30 mg tablet     GU PSH: Bladder Instill AntiCA Agent - 02/04/2021, 01/28/2021, 01/21/2021, 09/05/2020, 08/15/2020, 2021, 2021, 2021, 2021, 2021, 2021, 2019, 2018, 2018, 2018, 2018, 2018 Cysto Bladder Ureth Biopsy - 06/25/2020 Cysto Dilate Stricture (M or F) Cysto Uretero Biopsy Fulgura - 04/07/2022 Cystoscopy - 03/11/2022, 11/13/2021, 09/18/2021, 06/23/2021, 05/22/2021, 02/17/2021, 10/14/2020, 05/16/2020, 2020, 2020, 2020, 2020, 2019, 2019, 2019, 2019, 2018 Cystoscopy Insert Stent - 04/07/2022, Right - 2018 Cystoscopy TURBT <2 cm - 12/02/2021 Cystoscopy TURBT >5 cm - 2021, 2018 Cystoscopy TURBT 2-5 cm - 07/08/2021, 10/29/2020, 2018 Cystoscopy TURP Locm 300-'399Mg'$ /Ml Iodine,1Ml - 2018 Remove Prostate Regrowth - 2021     NON-GU PSH: Cataract surgery - 2019 Knee Arthroscopy, Left     GU PMH: Bladder Cancer overlapping sites - 04/17/2022, - 04/01/2022, - 03/27/2022, - 03/17/2022, - 03/11/2022, - 12/08/2021, - 11/13/2021, - 09/18/2021, - 07/30/2021, - 06/23/2021, - 02/27/2021, - 02/17/2021, - 02/04/2021, - 01/28/2021, - 01/21/2021, - 01/14/2021, - 12/27/2020, - 11/11/2020, - 09/11/2020, - 09/05/2020, - 08/15/2020, - 08/08/2020, - 2020, - 2020, - 2020, - 2019, - 2019, - 2019, - 2019, - 2018, - 2018 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 He will continue with ABX as Rx'd and proceed with cysto on 09/20/17 as scheduled. Instructed pt and family that if he develops gross hematuria with clots will need to go immediately to [facility] ER for evaluation. Culture urine. Will continue with current ABX. , - 2018, - 2018, - 2018, - 2018 Urinary Urgency - 12/08/2021, - 05/22/2021, - 02/17/2021, - 11/11/2020 Urinary Frequency - 07/30/2021, - 02/17/2021, - 10/14/2020 Bladder Cancer Posterior - 07/14/2021, - 06/09/2021, - 05/22/2021, Patient with possible bladder tumor recurrence on cystoscopy today. He has active oozing from posterior bladder wall in between 2 prior TUR sites with irregular bladder mucosa. Given his recent recurrence we discussed proceeding with cystoscopy, bladder biopsy and fulguration of abnormal tissue. Will also discussed with patient intravesical gemcitabine. Risks and benefits of the procedure discussed with the patient including but not limited to pain, bleeding, possible need for Foley catheter, injury to surrounding structures including bladder ureters and prostate, need for future treatment, infection., - 10/14/2020 Acute Cystitis/UTI - 06/09/2021, - 02/27/2021, - 12/27/2020, - 11/11/2020, - 07/05/2020 BPH w/LUTS - 05/22/2021, - 02/17/2021, Patient remains on Rapaflo and finasteride however still struggling with urinary frequency. Cystoscopy shows wide open prosthetic urethra. PVR is less than 100 mL today in the office. We discussed a trial of Myrbetriq 25 mg daily. I did discuss that he needs to take his blood pressure while starting medication. He will let me know if he has any adverse side effects., - 10/14/2020, - 2020, - 2020, - 2020, - 2019, - 2019, - 2019, - 2019, - 2018, Benign prostatic hyperplasia with urinary obstruction, - 2014 Urinary Tract Inf, Unspec site - 01/01/2021 Urinary Retention - 2021 ED due to arterial insufficiency, Erectile dysfunction due to arterial  insufficiency - 2014 Urinary Retention, Unspec, Incomplete bladder emptying - 2014 History of bladder cancer      PMH Notes: .   NON-GU PMH: Arthritis GERD Hypercholesterolemia Hypertension Hypothyroidism    FAMILY HISTORY: Alzheimer's Disease - Mother Brain tumor - Mother Breast Cancer - Mother Cardiac Devices Pacemaker Present - Sister copd - Father Death of family member - Father, Mother Diabetes - Brother Hypertension - Runs In Family Kidney Stones - Son, Runs in Family    Notes: 2 sons; 1 daughter   SOCIAL HISTORY: Marital Status: Widowed Preferred Language: English; Ethnicity: Not Hispanic Or Latino; Race: White Current Smoking Status: Patient has never smoked.   Tobacco Use Assessment Completed: Used Tobacco in last 30 days? Drinks 4 drinks per month.  Drinks 4+ caffeinated drinks per day. Patient's occupation is/was retired.        REVIEW OF SYSTEMS:    GU Review Male:   Patient denies frequent urination, hard to postpone urination, burning/ pain with urination, get up at night to urinate, leakage of urine, stream starts and stops, trouble starting your stream, have to strain to urinate , erection problems, and penile pain.  Gastrointestinal (Upper):   Patient denies nausea, vomiting, and indigestion/ heartburn.  Gastrointestinal (Lower):   Patient denies diarrhea and constipation.  Constitutional:   Patient denies fever, night sweats, weight loss, and fatigue.  Skin:   Patient denies skin rash/ lesion and itching.  Eyes:   Patient denies blurred vision and double vision.  Ears/ Nose/ Throat:   Patient denies sore throat and sinus problems.  Hematologic/Lymphatic:   Patient denies swollen glands and easy bruising.  Cardiovascular:   Patient denies leg swelling and chest pains.  Respiratory:   Patient denies cough and shortness of breath.  Endocrine:   Patient denies excessive thirst.  Musculoskeletal:   Patient denies back pain and joint pain.  Neurological:    Patient denies headaches and dizziness.  Psychologic:   Patient denies depression and anxiety.   Complexity of Data:  Source Of History:  Patient  Records Review:   Previous Patient Records  Urine Test Review:   Urinalysis   08/18/08 02/20/08 02/17/07 10/14/05 06/23/04 07/23/03  PSA  Total PSA 2.00  2.49  2.38  2.24  2.29  2.47   Free PSA   0.79      % Free PSA   33.2        PROCEDURES:          Urinalysis w/Scope Dipstick Dipstick Cont'd Micro  Color: Yellow Bilirubin: Neg mg/dL WBC/hpf: 0 - 5/hpf  Appearance: Clear Ketones: Neg mg/dL RBC/hpf: 10 - 20/hpf  Specific Gravity: 1.015 Blood: 3+ ery/uL Bacteria: NS (Not Seen)  pH: <=5.0 Protein: Neg mg/dL Cystals: NS (Not Seen)  Glucose: Neg mg/dL Urobilinogen: 0.2 mg/dL Casts: NS (Not Seen)    Nitrites: Neg Trichomonas: Not Present    Leukocyte Esterase: Neg leu/uL Mucous: Not Present      Epithelial Cells: 0 - 5/hpf      Yeast: NS (Not Seen)      Sperm: Not Present         Notes:   Dressing change of PCN tube completed todat   ASSESSMENT: None   PLAN:           Document Letter(s):  Created for Patient: Clinical Summary         Notes:   His dressing was changed today. He is doing well and his PCN tube is draining. After discussion with Dr. Claudia Desanctis, plan is to proceed with antegrade nephrogram to ensure left kidney is draining prior to removal of PCN. He will we will then follow-up with the patient regarding nephrogram results and decide on whether or not a diagnostic cystoscopy is warranted and if PCN tube and stent can be removed.

## 2022-08-06 NOTE — Progress Notes (Signed)
COVID Vaccine Completed:  Yes  Date of COVID positive in last 90 days:  PCP - [name], MD Cardiologist - Buford Dresser, MD  Chest x-ray -  EKG - 06-16-22 Epic Stress Test - 08-22-20 Epic ECHO - greater than 2 years Epic Cardiac Cath -  Pacemaker/ICD device last checked: Spinal Cord Stimulator:  Bowel Prep -   Sleep Study - Yes, +sleep apnea CPAP -   Fasting Blood Sugar -  Checks Blood Sugar _____ times a day  Blood Thinner Instructions: Aspirin Instructions:  ASA 81  Last Dose:  Activity level:  Can go up a flight of stairs and perform activities of daily living without stopping and without symptoms of chest pain or shortness of breath.  Able to exercise without symptoms  Unable to go up a flight of stairs without symptoms of     Anesthesia review:  Followed by cardiology for hx of chest pain and palpitations.  HTN, OSA, CKD  Patient denies shortness of breath, fever, cough and chest pain at PAT appointment  Patient verbalized understanding of instructions that were given to them at the PAT appointment. Patient was also instructed that they will need to review over the PAT instructions again at home before surgery.

## 2022-08-06 NOTE — Patient Instructions (Signed)
SURGICAL WAITING ROOM VISITATION Patients having surgery or a procedure may have no more than 2 support people in the waiting area - these visitors may rotate.   Children under the age of 19 must have an adult with them who is not the patient. If the patient needs to stay at the hospital during part of their recovery, the visitor guidelines for inpatient rooms apply. Pre-op nurse will coordinate an appropriate time for 1 support person to accompany patient in pre-op.  This support person may not rotate.    Please refer to the [facility] website for the visitor guidelines for Inpatients (after your surgery is over and you are in a regular room).      Your procedure is scheduled on: 08-11-22   Report to [facility] Main Entrance    Report to admitting at 11:00 AM   Call this number if you have problems the morning of surgery [phone]   Do not eat food :After Midnight.   After Midnight you may have the following liquids until 10:15 AM DAY OF SURGERY  Water Non-Citrus Juices (without pulp, NO RED) Carbonated Beverages Black Coffee (NO MILK/CREAM OR CREAMERS, sugar ok)  Clear Tea (NO MILK/CREAM OR CREAMERS, sugar ok) regular and decaf                             Plain Jell-O (NO RED)                                           Fruit ices (not with fruit pulp, NO RED)                                     Popsicles (NO RED)                                                               Sports drinks like Gatorade (NO RED)                      If you have questions, please contact your surgeon's office.   FOLLOW  ANY ADDITIONAL PRE OP INSTRUCTIONS YOU RECEIVED FROM YOUR SURGEON'S OFFICE!!!     Oral Hygiene is also important to reduce your risk of infection.                                    Remember - BRUSH YOUR TEETH THE MORNING OF SURGERY WITH YOUR REGULAR TOOTHPASTE   Do NOT smoke after Midnight   Take these medicines the morning of surgery with A SIP OF WATER:  Levothyroxine.  Tylenol if needed  Bring CPAP mask and tubing day of surgery.                              You may not have any metal on your body including  jewelry, and body piercing  Do not wear lotions, powders, cologne, or deodorant              Men may shave face and neck.   Do not bring valuables to the hospital. [place].   Contacts, dentures or bridgework may not be worn into surgery.  DO NOT [place]. PHARMACY WILL DISPENSE MEDICATIONS LISTED ON YOUR MEDICATION LIST TO YOU DURING YOUR ADMISSION [place]!   Patients discharged on the day of surgery will not be allowed to drive home.  Someone NEEDS to stay with you for the first 24 hours after anesthesia.  Special Instructions: Bring a copy of your healthcare power of attorney and living will documents the day of surgery if you haven't scanned them before.  Please read over the following fact sheets you were given: IF YOU HAVE QUESTIONS ABOUT YOUR PRE-OP INSTRUCTIONS PLEASE CALL [facility] - Preparing for Surgery Before surgery, you can play an important role.  Because skin is not sterile, your skin needs to be as free of germs as possible.  You can reduce the number of germs on your skin by washing with CHG (chlorahexidine gluconate) soap before surgery.  CHG is an antiseptic cleaner which kills germs and bonds with the skin to continue killing germs even after washing. Please DO NOT use if you have an allergy to CHG or antibacterial soaps.  If your skin becomes reddened/irritated stop using the CHG and inform your nurse when you arrive at Short Stay. Do not shave (including legs and underarms) for at least 48 hours prior to the first CHG shower.  You may shave your face/neck.  Please follow these instructions carefully:  1.  Shower with CHG Soap the night before surgery and the  morning of surgery.  2.  If you choose  to wash your hair, wash your hair first as usual with your normal  shampoo.  3.  After you shampoo, rinse your hair and body thoroughly to remove the shampoo.                             4.  Use CHG as you would any other liquid soap.  You can apply chg directly to the skin and wash.  Gently with a scrungie or clean washcloth.  5.  Apply the CHG Soap to your body ONLY FROM THE NECK DOWN.   Do   not use on face/ open                           Wound or open sores. Avoid contact with eyes, ears mouth and   genitals (private parts).                       Wash face,  Genitals (private parts) with your normal soap.             6.  Wash thoroughly, paying special attention to the area where your    surgery  will be performed.  7.  Thoroughly rinse your body with warm water from the neck down.  8.  DO NOT shower/wash with your normal soap after using and rinsing off the CHG Soap.                9.  Pat yourself dry with a clean towel.  10.  Wear clean pajamas.            11.  Place clean sheets on your bed the night of your first shower and do not  sleep with pets. Day of Surgery : Do not apply any lotions/deodorants the morning of surgery.  Please wear clean clothes to the hospital/surgery center.  FAILURE TO FOLLOW THESE INSTRUCTIONS MAY RESULT IN THE CANCELLATION OF YOUR SURGERY  PATIENT SIGNATURE_________________________________  NURSE SIGNATURE__________________________________  ________________________________________________________________________

## 2022-08-07 ENCOUNTER — Encounter (HOSPITAL_COMMUNITY)
Admission: RE | Admit: 2022-08-07 | Discharge: 2022-08-07 | Disposition: A | Discharge status: Home / Self Care | Payer: Medicare Other | Source: Ambulatory Visit | Attending: Urology | Admitting: Urology

## 2022-08-07 ENCOUNTER — Other Ambulatory Visit: Payer: Self-pay

## 2022-08-07 ENCOUNTER — Encounter (HOSPITAL_COMMUNITY): Payer: Self-pay

## 2022-08-07 VITALS — BP 135/70 | HR 83 | Temp 97.9°F | Resp 16 | Ht 68.0 in | Wt 170.4 lb

## 2022-08-07 DIAGNOSIS — I2510 Atherosclerotic heart disease of native coronary artery without angina pectoris: Secondary | ICD-10-CM | POA: Diagnosis not present

## 2022-08-07 DIAGNOSIS — D649 Anemia, unspecified: Secondary | ICD-10-CM | POA: Diagnosis not present

## 2022-08-07 DIAGNOSIS — Z01818 Encounter for other preprocedural examination: Secondary | ICD-10-CM

## 2022-08-07 DIAGNOSIS — Z01812 Encounter for preprocedural laboratory examination: Secondary | ICD-10-CM | POA: Diagnosis not present

## 2022-08-07 DIAGNOSIS — R7303 Prediabetes: Secondary | ICD-10-CM | POA: Diagnosis not present

## 2022-08-07 LAB — GLUCOSE, CAPILLARY: Glucose-Capillary: 97 mg/dL (ref 70–99)

## 2022-08-11 ENCOUNTER — Ambulatory Visit (HOSPITAL_COMMUNITY): Payer: Medicare Other | Admitting: Physician Assistant

## 2022-08-11 ENCOUNTER — Encounter (HOSPITAL_COMMUNITY): Payer: Self-pay | Admitting: Urology

## 2022-08-11 ENCOUNTER — Encounter (HOSPITAL_COMMUNITY)
Admission: RE | Disposition: A | Discharge status: Home / Self Care | Payer: Self-pay | Source: Home / Self Care | Attending: Urology

## 2022-08-11 ENCOUNTER — Ambulatory Visit (HOSPITAL_BASED_OUTPATIENT_CLINIC_OR_DEPARTMENT_OTHER): Payer: Medicare Other | Admitting: Certified Registered"

## 2022-08-11 ENCOUNTER — Ambulatory Visit (HOSPITAL_COMMUNITY)
Admission: RE | Admit: 2022-08-11 | Discharge: 2022-08-11 | Disposition: A | Discharge status: Home / Self Care | Payer: Medicare Other | Attending: Urology | Admitting: Urology

## 2022-08-11 ENCOUNTER — Ambulatory Visit (HOSPITAL_COMMUNITY): Payer: Medicare Other

## 2022-08-11 DIAGNOSIS — K219 Gastro-esophageal reflux disease without esophagitis: Secondary | ICD-10-CM | POA: Insufficient documentation

## 2022-08-11 DIAGNOSIS — J45909 Unspecified asthma, uncomplicated: Secondary | ICD-10-CM

## 2022-08-11 DIAGNOSIS — C662 Malignant neoplasm of left ureter: Secondary | ICD-10-CM | POA: Insufficient documentation

## 2022-08-11 DIAGNOSIS — N201 Calculus of ureter: Secondary | ICD-10-CM | POA: Diagnosis not present

## 2022-08-11 DIAGNOSIS — N1330 Unspecified hydronephrosis: Secondary | ICD-10-CM | POA: Diagnosis not present

## 2022-08-11 DIAGNOSIS — D649 Anemia, unspecified: Secondary | ICD-10-CM

## 2022-08-11 DIAGNOSIS — G4730 Sleep apnea, unspecified: Secondary | ICD-10-CM | POA: Diagnosis not present

## 2022-08-11 DIAGNOSIS — I10 Essential (primary) hypertension: Secondary | ICD-10-CM

## 2022-08-11 DIAGNOSIS — I2510 Atherosclerotic heart disease of native coronary artery without angina pectoris: Secondary | ICD-10-CM

## 2022-08-11 DIAGNOSIS — E039 Hypothyroidism, unspecified: Secondary | ICD-10-CM | POA: Diagnosis not present

## 2022-08-11 HISTORY — PX: CYSTOSCOPY WITH RETROGRADE PYELOGRAM, URETEROSCOPY AND STENT PLACEMENT: SHX5789

## 2022-08-11 HISTORY — PX: HOLMIUM LASER APPLICATION: SHX5852

## 2022-08-11 LAB — CBC
HCT: 27.1 % — ABNORMAL LOW (ref 39.0–52.0)
Hemoglobin: 8.8 g/dL — ABNORMAL LOW (ref 13.0–17.0)
MCH: 29.9 pg (ref 26.0–34.0)
MCHC: 32.5 g/dL (ref 30.0–36.0)
MCV: 92.2 fL (ref 80.0–100.0)
Platelets: 277 10*3/uL (ref 150–400)
RBC: 2.94 MIL/uL — ABNORMAL LOW (ref 4.22–5.81)
RDW: 19.1 % — ABNORMAL HIGH (ref 11.5–15.5)
WBC: 9 10*3/uL (ref 4.0–10.5)
nRBC: 0 % (ref 0.0–0.2)

## 2022-08-11 LAB — BASIC METABOLIC PANEL
Anion gap: 8 (ref 5–15)
BUN: 38 mg/dL — ABNORMAL HIGH (ref 8–23)
CO2: 18 mmol/L — ABNORMAL LOW (ref 22–32)
Calcium: 9 mg/dL (ref 8.9–10.3)
Chloride: 114 mmol/L — ABNORMAL HIGH (ref 98–111)
Creatinine, Ser: 2.67 mg/dL — ABNORMAL HIGH (ref 0.61–1.24)
GFR, Estimated: 22 mL/min — ABNORMAL LOW (ref >60)
Glucose, Bld: 101 mg/dL — ABNORMAL HIGH (ref 70–99)
Potassium: 3.9 mmol/L (ref 3.5–5.1)
Sodium: 140 mmol/L (ref 135–145)

## 2022-08-11 SURGERY — CYSTOURETEROSCOPY, WITH RETROGRADE PYELOGRAM AND STENT INSERTION
Anesthesia: General | Laterality: Left

## 2022-08-11 MED ORDER — DEXAMETHASONE SODIUM PHOSPHATE 4 MG/ML IJ SOLN
INTRAMUSCULAR | Status: DC | PRN
  Administered 2022-08-11: 8 mg via INTRAVENOUS

## 2022-08-11 MED ORDER — CIPROFLOXACIN IN D5W 400 MG/200ML IV SOLN
400.0000 mg | INTRAVENOUS | Status: AC
  Administered 2022-08-11: 400 mg via INTRAVENOUS
  Filled 2022-08-11: qty 200

## 2022-08-11 MED ORDER — PHENYLEPHRINE HCL-NACL 20-0.9 MG/250ML-% IV SOLN
INTRAVENOUS | Status: AC
  Filled 2022-08-11: qty 250

## 2022-08-11 MED ORDER — DEXAMETHASONE SODIUM PHOSPHATE 10 MG/ML IJ SOLN
INTRAMUSCULAR | Status: AC
  Filled 2022-08-11: qty 1

## 2022-08-11 MED ORDER — PROPOFOL 10 MG/ML IV BOLUS
INTRAVENOUS | Status: AC
  Filled 2022-08-11: qty 20

## 2022-08-11 MED ORDER — ACETAMINOPHEN 10 MG/ML IV SOLN: 1000.0000 mg | Freq: Once | INTRAVENOUS | Status: DC | PRN

## 2022-08-11 MED ORDER — LIDOCAINE HCL (PF) 2 % IJ SOLN
INTRAMUSCULAR | Status: AC
  Filled 2022-08-11: qty 5

## 2022-08-11 MED ORDER — PHENYLEPHRINE HCL (PRESSORS) 10 MG/ML IV SOLN
INTRAVENOUS | Status: DC | PRN
  Administered 2022-08-11: 160 ug via INTRAVENOUS

## 2022-08-11 MED ORDER — ONDANSETRON HCL 4 MG/2ML IJ SOLN
INTRAMUSCULAR | Status: DC | PRN
  Administered 2022-08-11: 4 mg via INTRAVENOUS

## 2022-08-11 MED ORDER — IOHEXOL 300 MG/ML  SOLN
INTRAMUSCULAR | Status: DC | PRN
  Administered 2022-08-11: 100 mL

## 2022-08-11 MED ORDER — 0.9 % SODIUM CHLORIDE (POUR BTL) OPTIME
TOPICAL | Status: DC | PRN
  Administered 2022-08-11: 1000 mL

## 2022-08-11 MED ORDER — EPHEDRINE SULFATE (PRESSORS) 50 MG/ML IJ SOLN
INTRAMUSCULAR | Status: DC | PRN
  Administered 2022-08-11: 10 mg via INTRAVENOUS

## 2022-08-11 MED ORDER — SODIUM CHLORIDE 0.9 % IR SOLN
Status: DC | PRN
  Administered 2022-08-11: 3000 mL

## 2022-08-11 MED ORDER — FENTANYL CITRATE PF 50 MCG/ML IJ SOSY: 25.0000 ug | PREFILLED_SYRINGE | INTRAMUSCULAR | Status: DC | PRN

## 2022-08-11 MED ORDER — ONDANSETRON HCL 4 MG/2ML IJ SOLN
INTRAMUSCULAR | Status: AC
  Filled 2022-08-11: qty 2

## 2022-08-11 MED ORDER — PROPOFOL 10 MG/ML IV BOLUS
INTRAVENOUS | Status: DC | PRN
  Administered 2022-08-11: 140 mg via INTRAVENOUS

## 2022-08-11 MED ORDER — ACETAMINOPHEN 325 MG PO TABS: 325.0000 mg | ORAL_TABLET | ORAL | Status: DC | PRN

## 2022-08-11 MED ORDER — ACETAMINOPHEN 160 MG/5ML PO SOLN: 325.0000 mg | ORAL | Status: DC | PRN

## 2022-08-11 MED ORDER — ORAL CARE MOUTH RINSE: 15.0000 mL | Freq: Once | OROMUCOSAL | Status: AC

## 2022-08-11 MED ORDER — CHLORHEXIDINE GLUCONATE 0.12 % MT SOLN
15.0000 mL | Freq: Once | OROMUCOSAL | Status: AC
  Administered 2022-08-11: 15 mL via OROMUCOSAL

## 2022-08-11 MED ORDER — LACTATED RINGERS IV SOLN: INTRAVENOUS | Status: DC

## 2022-08-11 MED ORDER — FENTANYL CITRATE (PF) 100 MCG/2ML IJ SOLN
INTRAMUSCULAR | Status: DC | PRN
  Administered 2022-08-11 (×2): 50 ug via INTRAVENOUS

## 2022-08-11 MED ORDER — FENTANYL CITRATE (PF) 100 MCG/2ML IJ SOLN
INTRAMUSCULAR | Status: AC
  Filled 2022-08-11: qty 2

## 2022-08-11 MED ORDER — LIDOCAINE 2% (20 MG/ML) 5 ML SYRINGE
INTRAMUSCULAR | Status: DC | PRN
  Administered 2022-08-11: 60 mg via INTRAVENOUS

## 2022-08-11 SURGICAL SUPPLY — 24 items
BAG DRAINAGE 600ML DEPOT (BAG) IMPLANT
BAG DRN 24 TWST VLV ADJ (BAG) ×1
BAG URO CATCHER STRL LF (MISCELLANEOUS) ×2 IMPLANT
BASKET ZERO TIP NITINOL 2.4FR (BASKET) IMPLANT
BSKT STON RTRVL ZERO TP 2.4FR (BASKET) ×1
CATH URETL OPEN 5X70 (CATHETERS) ×2 IMPLANT
CLOTH BEACON ORANGE TIMEOUT ST (SAFETY) ×2 IMPLANT
DRSG TEGADERM 2-3/8X2-3/4 SM (GAUZE/BANDAGES/DRESSINGS) IMPLANT
EXTRACTOR STONE 1.7FRX115CM (UROLOGICAL SUPPLIES) IMPLANT
GLOVE BIO SURGEON STRL SZ 6.5 (GLOVE) ×2 IMPLANT
GOWN STRL REUS W/ TWL LRG LVL3 (GOWN DISPOSABLE) ×2 IMPLANT
GOWN STRL REUS W/TWL LRG LVL3 (GOWN DISPOSABLE) ×1
GUIDEWIRE STR DUAL SENSOR (WIRE) ×2 IMPLANT
KIT TURNOVER KIT A (KITS) IMPLANT
LASER FIB FLEXIVA PULSE ID 365 (Laser) IMPLANT
MANIFOLD NEPTUNE II (INSTRUMENTS) ×2 IMPLANT
PACK CYSTO (CUSTOM PROCEDURE TRAY) ×2 IMPLANT
SHEATH NAVIGATOR HD 11/13X28 (SHEATH) IMPLANT
SHEATH NAVIGATOR HD 11/13X36 (SHEATH) IMPLANT
SYR 20ML LL LF (SYRINGE) IMPLANT
TRACTIP FLEXIVA PULS ID 200XHI (Laser) IMPLANT
TRACTIP FLEXIVA PULSE ID 200 (Laser)
TUBING CONNECTING 10 (TUBING) ×2 IMPLANT
TUBING UROLOGY SET (TUBING) ×2 IMPLANT

## 2022-08-11 NOTE — Anesthesia Postprocedure Evaluation (Signed)
Anesthesia Post Note  Patient: Arnold T Russ  Procedure(s) Performed: CYSTOSCOPY WITH RETROGRADE PYELOGRAM, ANTEGRADE NEPHROSTOGRAM, DIAGNOSTIC  URETEROSCOPY (Left) HOLMIUM LASER APPLICATION (Left)     Patient location during evaluation: PACU Anesthesia Type: General Level of consciousness: awake and alert Pain management: pain level controlled Vital Signs Assessment: post-procedure vital signs reviewed and stable Respiratory status: spontaneous breathing, nonlabored ventilation, respiratory function stable and patient connected to nasal cannula oxygen Cardiovascular status: blood pressure returned to baseline and stable Postop Assessment: no apparent nausea or vomiting Anesthetic complications: no   No notable events documented.  Last Vitals:  Vitals:   08/11/22 1445 08/11/22 1500  BP: 124/77 130/69  Pulse: 72 81  Resp: 13 16  Temp: (!) 36.4 C (!) 36.4 C  SpO2: 95% 99%    Last Pain:  Vitals:   08/11/22 1500  TempSrc:   PainSc: 0-No pain                 Effie Berkshire

## 2022-08-11 NOTE — Anesthesia Preprocedure Evaluation (Signed)
Anesthesia Evaluation  Patient identified by MRN, date of birth, ID band Patient awake    Reviewed: Allergy & Precautions, NPO status , Patient's Chart, lab work & pertinent test results  Airway Mallampati: III  TM Distance: >3 FB Neck ROM: Full    Dental  (+) Teeth Intact, Dental Advisory Given   Pulmonary asthma , sleep apnea ,    breath sounds clear to auscultation       Cardiovascular hypertension, Pt. on medications  Rhythm:Regular Rate:Normal     Neuro/Psych negative neurological ROS  negative psych ROS   GI/Hepatic Neg liver ROS, GERD  Medicated,  Endo/Other  Hypothyroidism   Renal/GU Renal disease     Musculoskeletal  (+) Arthritis ,   Abdominal   Peds  Hematology   Anesthesia Other Findings   Reproductive/Obstetrics                             Anesthesia Physical Anesthesia Plan  ASA: 3  Anesthesia Plan: General   Post-op Pain Management:    Induction: Intravenous  PONV Risk Score and Plan: 3 and Ondansetron and Treatment may vary due to age or medical condition  Airway Management Planned: LMA  Additional Equipment: None  Intra-op Plan:   Post-operative Plan: Extubation in OR  Informed Consent: I have reviewed the patients History and Physical, chart, labs and discussed the procedure including the risks, benefits and alternatives for the proposed anesthesia with the patient or authorized representative who has indicated his/her understanding and acceptance.     Dental advisory given  Plan Discussed with: CRNA  Anesthesia Plan Comments:         Anesthesia Quick Evaluation

## 2022-08-11 NOTE — Op Note (Signed)
Preoperative diagnosis: left ureteral cancer  Postoperative diagnosis: left ueteral cancer  Procedure:  Cystoscopy left diagnostic ureteroscopy left ureteral stent removal left retrograde and antegrade pyelography with interpretation  Surgeon: Jacalyn Lefevre, MD  Anesthesia: General  Complications: None  Intraoperative findings:  Normal anterior urethra Bilateral lobe hypertrophy prostatic urethra Stent seen emanating from LEFT UO with mild edema, right UO effluxing clear yellow urine left retrograde pyelography demonstrated filling defect in proximal ureter similar to prior studies; antegrade nephrostogram with large capacity and some contrast seen going distally down ureter however the entire proximal ureter does not seem opacified due to obstructing mass Bladder mucosa without masses.  Well healed prior TUR scars.    EBL: Minimal  Specimens: none  Disposition of specimens: [facility] for stone analysis  Indication: [name] [name] is a 88 y.o.   patient with left ureteral cancer who recently completed 6 treatments of [name].  He is here for diagnostic ureteroscopy with retrograde and antegrade pyelogram to see if tumor burden has been reduced.  After reviewing the management options for treatment, the patient elected to proceed with the above surgical procedure(s). We have discussed the potential benefits and risks of the procedure, side effects of the proposed treatment, the likelihood of the patient achieving the goals of the procedure, and any potential problems that might occur during the procedure or recuperation. Informed consent has been obtained.   Description of procedure:  The patient was taken to the operating room and general anesthesia was induced.  The patient was placed in the dorsal lithotomy position, prepped and draped in the usual sterile fashion, and preoperative antibiotics were administered. A preoperative time-out was performed.    Cystourethroscopy was performed.  The patient's urethra was examined and demonstrated bilobar prostatic hypertrophy.  The bladder was then systematically examined in its entirety. There was no evidence for any bladder tumors, stones, or other mucosal pathology.    Attention then turned to the left ureteral orifice and graspers were used to remove the existing ureteral stent.  It was noted to be intact.  A 0.38 sensor wire was then used to cannulate the left ureteral orifice and advanced to the kidney with fluoroscopic guidance.  Next an antegrade nephrostogram was taken through the patient's existing left nephrostomy tube.  30 cc of Omnipaque contrast was injected through the nephrostomy tube.  The left collecting system showed moderate hydronephrosis with some contrast going down the ureter however obstruction clearly seen in the proximal ureter.  Next attention turned back to the bladder where semirigid ureteroscopy took place alongside the sensor wire.  The ureteroscope was able to navigate into the proximal ureter where a large blood clot and tissue obstruction was seen.  A second sensor wire was placed through the semirigid ureteroscope and advanced to the kidney with fluoroscopic guidance.  The ureteroscope was removed.  Next ureteral access sheath was then placed over the second wire and advanced to the mid ureter with fluoroscopic guidance.  The inner sheath and wire removed.  Flexible ureteroscopy then took place.  In the proximal ureter there was blood clot with tissue and papillary tumor in the ureter.  This created obstruction and I was unable to traverse the scope past this area.  While it did appeared slightly improved from last diagnostic ureteroscopy it was nonsignificant difference.  The ureteroscope was removed.  Decision was made to not replace the stent as the tumor is still causing compression.   The bladder was then emptied and the procedure  ended.  The patient appeared to tolerate  the procedure well and without complications.  The patient was able to be awakened and transferred to the recovery unit in satisfactory condition.   Disposition: The nephrostomy tube was read hooked to a drainage bag.  We will discuss management options at patient's postop appointment.

## 2022-08-11 NOTE — Anesthesia Procedure Notes (Signed)
Procedure Name: LMA Insertion Date/Time: 08/11/2022 1:24 PM  Performed by: Raven Furnas, Forest Gleason, CRNAPre-anesthesia Checklist: Patient identified, Emergency Drugs available, Suction available, Patient being monitored and Timeout performed Patient Re-evaluated:Patient Re-evaluated prior to induction Oxygen Delivery Method: Circle system utilized Preoxygenation: Pre-oxygenation with 100% oxygen Induction Type: IV induction Ventilation: Mask ventilation without difficulty LMA: LMA inserted LMA Size: 5.0 Dental Injury: Teeth and Oropharynx as per pre-operative assessment

## 2022-08-11 NOTE — Transfer of Care (Signed)
Immediate Anesthesia Transfer of Care Note  Patient: [name] [name]  Procedure(s) Performed: CYSTOSCOPY WITH RETROGRADE PYELOGRAM, ANTEGRADE NEPHROSTOGRAM, DIAGNOSTIC  URETEROSCOPY (Left) HOLMIUM LASER APPLICATION (Left)  Patient Location: PACU  Anesthesia Type:General  Level of Consciousness: sedated  Airway & Oxygen Therapy: Patient Spontanous Breathing  Post-op Assessment: Report given to RN  Post vital signs: stable  Last Vitals:  Vitals Value Taken Time  BP 103/56 08/11/22 1412  Temp    Pulse 69 08/11/22 1414  Resp 9 08/11/22 1414  SpO2 100 % 08/11/22 1414  Vitals shown include unvalidated device data.  Last Pain:  Vitals:   08/11/22 1144  TempSrc:   PainSc: 0-No pain         Complications: No notable events documented.

## 2022-08-11 NOTE — Discharge Instructions (Signed)
DISCHARGE INSTRUCTIONS FOR URETEROSCOPY and REMOVAL of STENT  MEDICATIONS:  1. Resume all your other meds from home  2. AZO over the counter can help with the burning/stinging when you urinate. 3. Tramadol is for moderate/severe pain, otherwise taking up to 1000 mg every 6 hours of plain Tylenol will help treat your pain.      ACTIVITY:  1. No strenuous activity x 1week  2. No driving while on narcotic pain medications  3. Drink plenty of water  4. Continue to walk at home - you can still get blood clots when you are at home, so keep active, but don't over do it.  5. May return to work/school tomorrow or when you feel ready   BATHING:  1. You can shower and we recommend daily showers  2. Your stent was removed  SIGNS/SYMPTOMS TO CALL:  Please call us if you have a fever greater than 101.5, uncontrolled nausea/vomiting, uncontrolled pain, dizziness, unable to urinate, bloody urine, chest pain, shortness of breath, leg swelling, leg pain, redness around wound, drainage from wound, or any other concerns or questions.   You can reach [place] at [phone].   FOLLOW-UP:  1. We will talk about next steps at follow up

## 2022-08-12 ENCOUNTER — Encounter (HOSPITAL_COMMUNITY): Payer: Self-pay | Admitting: Urology

## 2022-08-19 DIAGNOSIS — N131 Hydronephrosis with ureteral stricture, not elsewhere classified: Secondary | ICD-10-CM | POA: Diagnosis not present

## 2022-08-19 DIAGNOSIS — C662 Malignant neoplasm of left ureter: Secondary | ICD-10-CM | POA: Diagnosis not present

## 2022-08-21 ENCOUNTER — Telehealth: Payer: Self-pay

## 2022-08-21 ENCOUNTER — Other Ambulatory Visit: Payer: Self-pay | Admitting: Urology

## 2022-08-21 ENCOUNTER — Telehealth: Payer: Self-pay | Admitting: Cardiology

## 2022-08-21 DIAGNOSIS — C662 Malignant neoplasm of left ureter: Secondary | ICD-10-CM | POA: Diagnosis not present

## 2022-08-21 DIAGNOSIS — N131 Hydronephrosis with ureteral stricture, not elsewhere classified: Secondary | ICD-10-CM | POA: Diagnosis not present

## 2022-08-21 DIAGNOSIS — C678 Malignant neoplasm of overlapping sites of bladder: Secondary | ICD-10-CM | POA: Diagnosis not present

## 2022-08-21 NOTE — Telephone Encounter (Signed)
Spoke with patient who is agreeable to to do a tele visit on 10/4 at 2 pm. Med rec and consent are done.

## 2022-08-21 NOTE — Telephone Encounter (Signed)
   Pre-operative Risk Assessment    Patient Name: BRAXSTON QUINTER  DOB: 08/04/1932 MRN: [id number]      Request for Surgical Clearance    Procedure:  NEPHROURETERECTOMY RETROPERITONEAL LYMPH NODE DISSECTION Left   Date of Surgery:  Clearance 09/02/22                                 Surgeon:  Dr Alexis Frock  Surgeon's Group or Practice Name:  [facility]  Phone number:  [phone] Fax number:  [phone]   Type of Clearance Requested:   - Medical  - Pharmacy:  Hold Aspirin Needs to hold 5 days previous to procedure    Type of Anesthesia:  General    Additional requests/questions:   Caller wanted pre-op team to know this procedure is 3 hours long.   Signed, April Henson  -- 08/21/2022, 2:34 PM  --

## 2022-08-21 NOTE — Telephone Encounter (Signed)
  Patient Consent for Virtual Visit        [name] [name] has provided verbal consent on 08/21/2022 for a virtual visit (video or telephone).   CONSENT FOR VIRTUAL VISIT FOR:  [name] [name]  By participating in this virtual visit I agree to the following:  I hereby voluntarily request, consent and authorize [place] and its employed or contracted physicians, physician assistants, nurse practitioners or other licensed health care professionals (the Practitioner), to provide me with telemedicine health care services (the "Services") as deemed necessary by the treating Practitioner. I acknowledge and consent to receive the Services by the Practitioner via telemedicine. I understand that the telemedicine visit will involve communicating with the Practitioner through live audiovisual communication technology and the disclosure of certain medical information by electronic transmission. I acknowledge that I have been given the opportunity to request an in-person assessment or other available alternative prior to the telemedicine visit and am voluntarily participating in the telemedicine visit.  I understand that I have the right to withhold or withdraw my consent to the use of telemedicine in the course of my care at any time, without affecting my right to future care or treatment, and that the Practitioner or I may terminate the telemedicine visit at any time. I understand that I have the right to inspect all information obtained and/or recorded in the course of the telemedicine visit and may receive copies of available information for a reasonable fee.  I understand that some of the potential risks of receiving the Services via telemedicine include:  Delay or interruption in medical evaluation due to technological equipment failure or disruption; Information transmitted may not be sufficient (e.g. poor resolution of images) to allow for appropriate medical decision making by the  Practitioner; and/or  In rare instances, security protocols could fail, causing a breach of personal health information.  Furthermore, I acknowledge that it is my responsibility to provide information about my medical history, conditions and care that is complete and accurate to the best of my ability. I acknowledge that Practitioner's advice, recommendations, and/or decision may be based on factors not within their control, such as incomplete or inaccurate data provided by me or distortions of diagnostic images or specimens that may result from electronic transmissions. I understand that the practice of medicine is not an exact science and that Practitioner makes no warranties or guarantees regarding treatment outcomes. I acknowledge that a copy of this consent can be made available to me via my patient portal ([place]), or I can request a printed copy by calling the office of [place].    I understand that my insurance will be billed for this visit.   I have read or had this consent read to me. I understand the contents of this consent, which adequately explains the benefits and risks of the Services being provided via telemedicine.  I have been provided ample opportunity to ask questions regarding this consent and the Services and have had my questions answered to my satisfaction. I give my informed consent for the services to be provided through the use of telemedicine in my medical care

## 2022-08-21 NOTE — Telephone Encounter (Signed)
   Name: [name] [name]  DOB: 10-May-1932  MRN: [id number]  Primary Cardiologist: Buford Dresser, MD   Preoperative team, please contact this patient and set up a phone call appointment for further preoperative risk assessment. Please obtain consent and complete medication review. Thank you for your help.  I confirm that guidance regarding antiplatelet and oral anticoagulation therapy has been completed and, if necessary, noted below.  Per office protocol, if patient is without any new symptoms or concerns at the time of their virtual visit, he may hold Aspirin for 5-7 days prior to procedure. Please resume Aspirin as soon as possible postprocedure, at the discretion of the surgeon.     Lenna Sciara, NP 08/21/2022, 2:55 PM [place]

## 2022-08-22 DIAGNOSIS — I129 Hypertensive chronic kidney disease with stage 1 through stage 4 chronic kidney disease, or unspecified chronic kidney disease: Secondary | ICD-10-CM | POA: Diagnosis not present

## 2022-08-22 DIAGNOSIS — E782 Mixed hyperlipidemia: Secondary | ICD-10-CM | POA: Diagnosis not present

## 2022-08-22 DIAGNOSIS — E039 Hypothyroidism, unspecified: Secondary | ICD-10-CM | POA: Diagnosis not present

## 2022-08-22 DIAGNOSIS — N1832 Chronic kidney disease, stage 3b: Secondary | ICD-10-CM | POA: Diagnosis not present

## 2022-08-24 NOTE — Progress Notes (Addendum)
For Short Stay: [facility] appointment date: N/A Date of COVID positive in last 72 days:N/A  Bowel Prep reminder:N/A   For Anesthesia: PCP - [name], MD Cardiologist - Buford Dresser, MD 06/16/22 in epic cardiac clearance note 08/26/22 by Emmaline Life, NP in epic  Chest x-ray - greater than 1 year 04/11/21 in epic EKG - 06/16/22 in epic Stress Test - 08/22/20 in epic ECHO - greater than 2 years 05/03/20 in epic Cardiac Cath - N/A Pacemaker/ICD device last checked:N/A Pacemaker orders received: N/A Device Rep notified: N/A  Spinal Cord Stimulator: N/A  Sleep Study - Yes CPAP - Yes  Fasting Blood Sugar - N/A Checks Blood Sugar __N/A___ times a day Date and result of last Hgb A1c-N/A  Blood Thinner Instructions: N/A Aspirin Instructions: 81 mg Last Dose: 08/26/2022  Activity level: Can go up a flight of stairs and activities of daily living without stopping and without chest pain and/or shortness of breath       Anesthesia review: HTN, CKD, OSA  Patient denies shortness of breath, fever, cough and chest pain at PAT appointment   Patient verbalized understanding of instructions that were given via telephone.

## 2022-08-26 ENCOUNTER — Encounter: Payer: Self-pay | Admitting: Nurse Practitioner

## 2022-08-26 ENCOUNTER — Ambulatory Visit: Payer: Medicare Other | Attending: Internal Medicine | Admitting: Nurse Practitioner

## 2022-08-26 DIAGNOSIS — Z01810 Encounter for preprocedural cardiovascular examination: Secondary | ICD-10-CM

## 2022-08-26 NOTE — Progress Notes (Signed)
Virtual Visit via Telephone Note   Because of [name] [name]'s co-morbid illnesses, he is at least at moderate risk for complications without adequate follow up.  This format is felt to be most appropriate for this patient at this time.  The patient did not have access to video technology/had technical difficulties with video requiring transitioning to audio format only (telephone).  All issues noted in this document were discussed and addressed.  No physical exam could be performed with this format.  Please refer to the patient's chart for his consent to telehealth for [facility].  Evaluation Performed:  Preoperative cardiovascular risk assessment _____________   Date:  08/26/2022   Patient ID:  [name] [name], DOB 07/09/1932, MRN [id number] Patient Location:  Home Provider location:   Office  Primary Care Provider:  [name], MD Primary Cardiologist:  Buford Dresser, MD  Chief Complaint / Patient Profile   88 y.o. y/o male with a h/o HTN, low risk nuclear stress test with no evidence of ischemia or infarction 07/2020, sleep apnea on BiPAP, bladder cancer who is pending left nephroureterectomy retroperitoneal lymph node dissection  and presents today for telephonic preoperative cardiovascular risk assessment.  Past Medical History    Past Medical History:  Diagnosis Date   Anemia    Arthritis    lower back bulging disc, hips, knees, thumbs, shoulder   Asthma    as a pre teen   Bladder cancer ([place]) UROLOGIST-  DR MCKENZIE   S/P TURBT 08-16-2017   BPH (benign prostatic hyperplasia)    CKD (chronic kidney disease), stage III ([place])    STAGE 3  B CKD per lov 10-16-2020 dr Rica Mast Jeri Cos kidney on chart   Dyspnea    Fatigue    MIDDLE OF DAY ON OCCASION   Frequency of urination    GERD (gastroesophageal reflux disease)    on protonix   Hematuria    Hematuria    History of colon polyps    HLD (hyperlipidemia)    HOH (hard of  hearing)    slightly hoh right worse thanleft   HTN (hypertension)    Hypothyroidism    Incontinence of urine    weras pads all the time   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   Right Buccal Cheek   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-- urologist- dr Alyson Ingles   s/p  TURBT 08-16-2017  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   Sleep apnea    uses cpap set on 20 /4   Trigger finger of left hand 07/04/2021   last few months per pt   UTI (urinary tract infection) 08/20/2020   UTI (urinary tract infection)    finished ampicillin on 06-13-2021 all symptoms resolved   Weakness of extremity    left knee   Wears glasses    Past Surgical History:  Procedure Laterality Date   BCG X 2     AFTER LAST 2 BLADDER TUMOR REMOVALS   CATARACT EXTRACTION, BILATERAL     COLONSCOPY  YRS AGO   CYSTOSCOPY N/A 10/29/2020   Procedure: CYSTOSCOPY;  Surgeon: Robley Fries, MD;  Location: [facility];  Service: Urology;  Laterality: N/A;   CYSTOSCOPY N/A 07/08/2021   Procedure: CYSTOSCOPY;  Surgeon: Robley Fries, MD;  Location[facility];  Service: Urology;  Laterality: N/A;   CYSTOSCOPY W/ URETERAL STENT PLACEMENT Bilateral 08/16/2017   Procedure: CYSTOSCOPY WITH RETROGRADE PYELOGRAM/URETERAL STENT PLACEMENT;  Surgeon: Cleon Gustin, MD;  Location: [facility];  Service: Urology;  Laterality: Bilateral;   CYSTOSCOPY WITH BIOPSY N/A 06/25/2020   Procedure: CYSTOSCOPY FULGURATION   BLADDER  BIOPSY;  Surgeon: Robley Fries, MD;  Location: [facility];  Service: Urology;  Laterality: N/A;   CYSTOSCOPY WITH FULGERATION N/A 12/08/2019   Procedure: CYSTOSCOPY WITH FULGERATION;  Surgeon: Cleon Gustin, MD;  Location[facility];  Service: Urology;  Laterality: N/A;   CYSTOSCOPY WITH FULGERATION N/A 03/24/2022   Procedure: CYSTOSCOPY WITH FULGERATION;   Surgeon: Robley Fries, MD;  Location: [facility];  Service: Urology;  Laterality: N/A;   CYSTOSCOPY WITH RETROGRADE PYELOGRAM, URETEROSCOPY AND STENT PLACEMENT Left 03/24/2022   Procedure: CYSTOSCOPY WITH RETROGRADE PYELOGRAM, DIAGNOSTIC URETEROSCOPY WITH BIOPSY AND STENT PLACEMENT;  Surgeon: Robley Fries, MD;  Location: [facility];  Service: Urology;  Laterality: Left;  90 MINUTES   CYSTOSCOPY WITH RETROGRADE PYELOGRAM, URETEROSCOPY AND STENT PLACEMENT Left 04/07/2022   Procedure: CYSTOSCOPY WITH  RETROGRADE PYELOGRAM, DIAGNOSTIC URETEROSCOPY WITH BIOPSY AND STENT EXCHANGE;  Surgeon: Robley Fries, MD;  Location: [facility];  Service: Urology;  Laterality: Left;  75 MINUTES   CYSTOSCOPY WITH RETROGRADE PYELOGRAM, URETEROSCOPY AND STENT PLACEMENT Left 08/11/2022   Procedure: CYSTOSCOPY WITH RETROGRADE PYELOGRAM, ANTEGRADE NEPHROSTOGRAM, DIAGNOSTIC  URETEROSCOPY;  Surgeon: Robley Fries, MD;  Location: [facility];  Service: Urology;  Laterality: Left;  90 MINS   HOLMIUM LASER APPLICATION Left 03/01/7352   Procedure: HOLMIUM LASER APPLICATION;  Surgeon: Robley Fries, MD;  Location: [facility];  Service: Urology;  Laterality: Left;   IR NEPHROSTOMY EXCHANGE LEFT  05/15/2022   IR NEPHROSTOMY EXCHANGE LEFT  07/10/2022   IR NEPHROSTOMY PLACEMENT LEFT  05/06/2022   KNEE ARTHROSCOPY Left 06/10/2011   PROSTATE BIOPSY  2000   TRANSURETHRAL RESECTION OF BLADDER TUMOR N/A 08/16/2017   Procedure: TRANSURETHRAL RESECTION OF BLADDER TUMOR (TURBT);  Surgeon: Cleon Gustin, MD;  Location[facility];  Service: Urology;  Laterality: N/A;   TRANSURETHRAL RESECTION OF BLADDER TUMOR  12-09-2012   dr Alona Bene [facility]   and TURP   TRANSURETHRAL RESECTION OF BLADDER TUMOR N/A 09/20/2017   Procedure: TRANSURETHRAL RESECTION OF BLADDER TUMOR (TURBT);  Surgeon: Cleon Gustin, MD;  Location: [facility];  Service: Urology;  Laterality: N/A;   TRANSURETHRAL RESECTION OF BLADDER TUMOR  N/A 12/08/2019   Procedure: TRANSURETHRAL RESECTION OF BLADDER TUMOR;  Surgeon: Cleon Gustin, MD;  Location: [facility];  Service: Urology;  Laterality: N/A;  1 HR   TRANSURETHRAL RESECTION OF BLADDER TUMOR N/A 10/29/2020   Procedure: BLADDER FULGERATION;  Surgeon: Robley Fries, MD;  Location: [facility];  Service: Urology;  Laterality: N/A;  30 MINS   TRANSURETHRAL RESECTION OF BLADDER TUMOR N/A 07/08/2021   Procedure: TRANSURETHRAL RESECTION OF BLADDER TUMOR (TURBT)/FULGERATION;  Surgeon: Robley Fries, MD;  Location: [facility];  Service: Urology;  Laterality: N/A;  27 MINS   TRANSURETHRAL RESECTION OF BLADDER TUMOR N/A 12/02/2021   Procedure: TRANSURETHRAL RESECTION OF BLADDER TUMOR (TURBT);  Surgeon: Robley Fries, MD;  Location: [facility];  Service: Urology;  Laterality: N/A;   TRANSURETHRAL RESECTION OF PROSTATE  2011   UPPER GI ENDOSCOPY  YRS AGO    Allergies  Allergies  Allergen Reactions   Macrobid [Nitrofurantoin] Shortness Of Breath   Ciprofloxacin Diarrhea    Occurred with '750mg'$  dose.  Has since taken '500mg'$  doses and  has had no reaction/diarrhea.   Tramadol Hcl     Pt unsure of reaction    Cephalexin Rash    Rash on arms    History of Present Illness    [name] [name] is a 88 y.o. male who presents via audio/video conferencing for a telehealth visit today.  Pt was last seen in cardiology clinic on 06/16/22 by Dr. Harrell Gave.  At that time [name] [name] was doing well.  The patient is now pending procedure as outlined above. Since his last visit, he denies chest pain, palpitations, melena, hematuria, hemoptysis, diaphoresis, weakness, presyncope, syncope, orthopnea, and PND. Reports chronic shortness of breath  with exertion that has been stable since undergoing chemotherapy. Has fatigue also associated with chemo and mild bilateral LE edema. He continues to do as much activity as he can, including  walking, vacuuming, and going up and down stairs.   Home Medications    Prior to Admission medications   Medication Sig Start Date End Date Taking? Authorizing Provider  acetaminophen (TYLENOL) 500 MG tablet Take 1,000 mg by mouth every 6 (six) hours as needed for moderate pain.    [provider]  amLODipine (NORVASC) 10 MG tablet Take 1 tablet (10 mg total) by mouth daily. Patient taking differently: Take 10 mg by mouth at bedtime. 08/23/20   Mercy Riding, MD  aspirin 81 MG tablet Take 81 mg by mouth at bedtime.     [provider]  ferrous sulfate 325 (65 FE) MG tablet Take 325 mg by mouth daily with breakfast.    [provider]  finasteride (PROSCAR) 5 MG tablet TAKE ONE TABLET BY MOUTH DAILY Patient taking differently: Take 5 mg by mouth at bedtime. 10/28/20   McKenzie, Candee Furbish, MD  irbesartan (AVAPRO) 75 MG tablet Take 37.5 mg by mouth daily at 12 noon.    [provider]  levothyroxine (SYNTHROID, LEVOTHROID) 50 MCG tablet Take 50 mcg by mouth daily before breakfast. 12/25/15   [provider]  mirabegron ER (MYRBETRIQ) 50 MG TB24 tablet Take 50 mg by mouth at bedtime.    [provider]  oxyCODONE (ROXICODONE) 5 MG immediate release tablet Take 1 tablet (5 mg total) by mouth every 8 (eight) hours as needed. 04/07/22 04/07/23  Robley Fries, MD  pantoprazole (PROTONIX) 20 MG tablet Take 20 mg by mouth at bedtime. 12/20/15   [provider]  psyllium (METAMUCIL) 58.6 % powder Take 0.5 packets by mouth every evening.    [provider]  rosuvastatin (CRESTOR) 5 MG tablet Take 5 mg by mouth at bedtime.    [provider]  silodosin (RAPAFLO) 8 MG CAPS capsule TAKE ONE CAPSULE BY MOUTH EVERY NIGHT AT BEDTIME 07/25/20   McKenzie, Candee Furbish, MD  triamcinolone ointment (KENALOG) 0.1 % Apply 1 Application topically daily as needed for rash. 06/11/22   [provider]    Physical Exam    Vital Signs:   [name] [name] does not have vital signs available for review today.  Given telephonic nature of communication, physical exam is limited. AAOx3. NAD. Normal affect.  Speech and respirations are unlabored.  Accessory Clinical Findings    None  Assessment & Plan    1.  Preoperative Cardiovascular Risk Assessment: The patient is doing well from a cardiac perspective. Therefore, based on ACC/AHA guidelines, the patient would be at acceptable risk for the planned procedure without further cardiovascular testing.  According to the Revised Cardiac Risk Index (RCRI), his  Perioperative Risk of Major Cardiac Event is (%): 6.6. His Functional Capacity in METs is: 6.61 according to the Duke Activity Status Index (DASI).  The patient was advised that if he develops new symptoms prior to surgery to contact our office to arrange for a follow-up visit, and he verbalized understanding.  Per office protocol, if patient is without any new symptoms or concerns at the time of their virtual visit, he may hold Aspirin for 5-7 days prior to procedure. Please resume Aspirin as soon as possible postprocedure, at the discretion of the surgeon.   A copy of this note will be routed to requesting surgeon.  Time:   Today, I have spent 10 minutes with the patient with telehealth technology discussing medical history, symptoms, and management plan.     Emmaline Life, NP-C  08/26/2022, 2:00 PM [address], [address] Office [phone] Fax [phone]

## 2022-08-28 ENCOUNTER — Encounter (HOSPITAL_COMMUNITY): Payer: Self-pay | Admitting: Urology

## 2022-08-28 ENCOUNTER — Other Ambulatory Visit: Payer: Self-pay

## 2022-09-01 NOTE — Anesthesia Preprocedure Evaluation (Addendum)
Anesthesia Evaluation  Patient identified by MRN, date of birth, ID band Patient awake    Reviewed: Allergy & Precautions, NPO status , Patient's Chart, lab work & pertinent test results  Airway Mallampati: II  TM Distance: >3 FB Neck ROM: Full    Dental no notable dental hx.    Pulmonary asthma , sleep apnea and Continuous Positive Airway Pressure Ventilation ,    Pulmonary exam normal        Cardiovascular hypertension, Pt. on medications + dysrhythmias  Rhythm:Regular Rate:Normal     Neuro/Psych negative neurological ROS  negative psych ROS   GI/Hepatic Neg liver ROS, GERD  Medicated,  Endo/Other  Hypothyroidism   Renal/GU CRFRenal diseaseRenal pelvis cancer  negative genitourinary   Musculoskeletal  (+) Arthritis , Osteoarthritis,    Abdominal Normal abdominal exam  (+)   Peds  Hematology  (+) Blood dyscrasia, anemia ,   Anesthesia Other Findings   Reproductive/Obstetrics                            Anesthesia Physical Anesthesia Plan  ASA: 3  Anesthesia Plan: General   Post-op Pain Management: Celebrex PO (pre-op)* and Tylenol PO (pre-op)*   Induction: Intravenous  PONV Risk Score and Plan: 2 and Ondansetron, Dexamethasone and Treatment may vary due to age or medical condition  Airway Management Planned: Mask and Oral ETT  Additional Equipment: ClearSight  Intra-op Plan:   Post-operative Plan: Extubation in OR  Informed Consent: I have reviewed the patients History and Physical, chart, labs and discussed the procedure including the risks, benefits and alternatives for the proposed anesthesia with the patient or authorized representative who has indicated his/her understanding and acceptance.     Dental advisory given  Plan Discussed with: CRNA  Anesthesia Plan Comments: (Lab Results      Component                Value               Date                      WBC                       9.0                 08/11/2022                HGB                      8.8 (L)             08/11/2022                HCT                      27.1 (L)            08/11/2022                MCV                      92.2                08/11/2022                PLT  277                 08/11/2022           Lab Results      Component                Value               Date                      NA                       140                 08/11/2022                K                        3.9                 08/11/2022                CO2                      18 (L)              08/11/2022                GLUCOSE                  101 (H)             08/11/2022                BUN                      38 (H)              08/11/2022                CREATININE               2.67 (H)            08/11/2022                CALCIUM                  9.0                 08/11/2022                GFRNONAA                 22 (L)              08/11/2022          )       Anesthesia Quick Evaluation

## 2022-09-02 ENCOUNTER — Inpatient Hospital Stay (HOSPITAL_COMMUNITY): Payer: Medicare Other | Admitting: Anesthesiology

## 2022-09-02 ENCOUNTER — Inpatient Hospital Stay (HOSPITAL_COMMUNITY)
Admission: RE | Admit: 2022-09-02 | Discharge: 2022-09-04 | DRG: 657 | Disposition: A | Discharge status: Home / Self Care | Payer: Medicare Other | Attending: Urology | Admitting: Urology

## 2022-09-02 ENCOUNTER — Inpatient Hospital Stay (HOSPITAL_COMMUNITY): Payer: Medicare Other

## 2022-09-02 ENCOUNTER — Encounter (HOSPITAL_COMMUNITY)
Admission: RE | Disposition: A | Discharge status: Home / Self Care | Payer: Self-pay | Source: Home / Self Care | Attending: Urology

## 2022-09-02 ENCOUNTER — Other Ambulatory Visit: Payer: Self-pay

## 2022-09-02 ENCOUNTER — Encounter (HOSPITAL_COMMUNITY): Payer: Self-pay | Admitting: Urology

## 2022-09-02 DIAGNOSIS — N400 Enlarged prostate without lower urinary tract symptoms: Secondary | ICD-10-CM | POA: Diagnosis present

## 2022-09-02 DIAGNOSIS — Z8249 Family history of ischemic heart disease and other diseases of the circulatory system: Secondary | ICD-10-CM

## 2022-09-02 DIAGNOSIS — N2889 Other specified disorders of kidney and ureter: Secondary | ICD-10-CM | POA: Diagnosis present

## 2022-09-02 DIAGNOSIS — R7303 Prediabetes: Secondary | ICD-10-CM

## 2022-09-02 DIAGNOSIS — Z85828 Personal history of other malignant neoplasm of skin: Secondary | ICD-10-CM

## 2022-09-02 DIAGNOSIS — D630 Anemia in neoplastic disease: Secondary | ICD-10-CM

## 2022-09-02 DIAGNOSIS — E785 Hyperlipidemia, unspecified: Secondary | ICD-10-CM | POA: Diagnosis not present

## 2022-09-02 DIAGNOSIS — N183 Chronic kidney disease, stage 3 unspecified: Secondary | ICD-10-CM | POA: Diagnosis not present

## 2022-09-02 DIAGNOSIS — N189 Chronic kidney disease, unspecified: Secondary | ICD-10-CM | POA: Diagnosis not present

## 2022-09-02 DIAGNOSIS — Z79890 Hormone replacement therapy (postmenopausal): Secondary | ICD-10-CM

## 2022-09-02 DIAGNOSIS — C652 Malignant neoplasm of left renal pelvis: Principal | ICD-10-CM | POA: Diagnosis present

## 2022-09-02 DIAGNOSIS — I129 Hypertensive chronic kidney disease with stage 1 through stage 4 chronic kidney disease, or unspecified chronic kidney disease: Secondary | ICD-10-CM

## 2022-09-02 DIAGNOSIS — Z803 Family history of malignant neoplasm of breast: Secondary | ICD-10-CM | POA: Diagnosis not present

## 2022-09-02 DIAGNOSIS — D631 Anemia in chronic kidney disease: Secondary | ICD-10-CM | POA: Diagnosis not present

## 2022-09-02 DIAGNOSIS — K219 Gastro-esophageal reflux disease without esophagitis: Secondary | ICD-10-CM | POA: Diagnosis present

## 2022-09-02 DIAGNOSIS — E039 Hypothyroidism, unspecified: Secondary | ICD-10-CM | POA: Diagnosis present

## 2022-09-02 DIAGNOSIS — Z79899 Other long term (current) drug therapy: Secondary | ICD-10-CM

## 2022-09-02 DIAGNOSIS — C642 Malignant neoplasm of left kidney, except renal pelvis: Secondary | ICD-10-CM | POA: Diagnosis not present

## 2022-09-02 DIAGNOSIS — Z7982 Long term (current) use of aspirin: Secondary | ICD-10-CM

## 2022-09-02 DIAGNOSIS — Z825 Family history of asthma and other chronic lower respiratory diseases: Secondary | ICD-10-CM

## 2022-09-02 DIAGNOSIS — D649 Anemia, unspecified: Secondary | ICD-10-CM | POA: Diagnosis not present

## 2022-09-02 DIAGNOSIS — N184 Chronic kidney disease, stage 4 (severe): Secondary | ICD-10-CM | POA: Diagnosis not present

## 2022-09-02 DIAGNOSIS — Z8551 Personal history of malignant neoplasm of bladder: Secondary | ICD-10-CM

## 2022-09-02 DIAGNOSIS — N138 Other obstructive and reflux uropathy: Secondary | ICD-10-CM | POA: Diagnosis not present

## 2022-09-02 DIAGNOSIS — Z820 Family history of epilepsy and other diseases of the nervous system: Secondary | ICD-10-CM

## 2022-09-02 HISTORY — DX: Other specified disorders of kidney and ureter: N28.89

## 2022-09-02 HISTORY — PX: ROBOT ASSITED LAPAROSCOPIC NEPHROURETERECTOMY: SHX6077

## 2022-09-02 HISTORY — PX: LYMPH NODE DISSECTION: SHX5087

## 2022-09-02 HISTORY — PX: CYSTOSCOPY W/ URETERAL STENT PLACEMENT: SHX1429

## 2022-09-02 LAB — BASIC METABOLIC PANEL
Anion gap: 9 (ref 5–15)
Anion gap: 11 (ref 5–15)
BUN: 44 mg/dL — ABNORMAL HIGH (ref 8–23)
BUN: 41 mg/dL — ABNORMAL HIGH (ref 8–23)
CO2: 18 mmol/L — ABNORMAL LOW (ref 22–32)
CO2: 16 mmol/L — ABNORMAL LOW (ref 22–32)
Calcium: 9 mg/dL (ref 8.9–10.3)
Calcium: 8.6 mg/dL — ABNORMAL LOW (ref 8.9–10.3)
Chloride: 111 mmol/L (ref 98–111)
Chloride: 112 mmol/L — ABNORMAL HIGH (ref 98–111)
Creatinine, Ser: 3.14 mg/dL — ABNORMAL HIGH (ref 0.61–1.24)
Creatinine, Ser: 2.98 mg/dL — ABNORMAL HIGH (ref 0.61–1.24)
GFR, Estimated: 18 mL/min — ABNORMAL LOW (ref >60)
GFR, Estimated: 20 mL/min — ABNORMAL LOW (ref >60)
Glucose, Bld: 90 mg/dL (ref 70–99)
Glucose, Bld: 161 mg/dL — ABNORMAL HIGH (ref 70–99)
Potassium: 4.2 mmol/L (ref 3.5–5.1)
Potassium: 3.9 mmol/L (ref 3.5–5.1)
Sodium: 138 mmol/L (ref 135–145)
Sodium: 139 mmol/L (ref 135–145)

## 2022-09-02 LAB — CBC
HCT: 24.9 % — ABNORMAL LOW (ref 39.0–52.0)
Hemoglobin: 8 g/dL — ABNORMAL LOW (ref 13.0–17.0)
MCH: 30.1 pg (ref 26.0–34.0)
MCHC: 32.1 g/dL (ref 30.0–36.0)
MCV: 93.6 fL (ref 80.0–100.0)
Platelets: 229 10*3/uL (ref 150–400)
RBC: 2.66 MIL/uL — ABNORMAL LOW (ref 4.22–5.81)
RDW: 19.9 % — ABNORMAL HIGH (ref 11.5–15.5)
WBC: 7.8 10*3/uL (ref 4.0–10.5)
nRBC: 0 % (ref 0.0–0.2)

## 2022-09-02 LAB — HEMOGLOBIN AND HEMATOCRIT, BLOOD
HCT: 29.8 % — ABNORMAL LOW (ref 39.0–52.0)
Hemoglobin: 9.4 g/dL — ABNORMAL LOW (ref 13.0–17.0)

## 2022-09-02 LAB — PREPARE RBC (CROSSMATCH)

## 2022-09-02 LAB — HEMOGLOBIN A1C
Hgb A1c MFr Bld: 6.3 % — ABNORMAL HIGH (ref 4.8–5.6)
Mean Plasma Glucose: 134.11 mg/dL

## 2022-09-02 LAB — ABO/RH: ABO/RH(D): A POS

## 2022-09-02 SURGERY — NEPHROURETERECTOMY, ROBOT-ASSISTED, LAPAROSCOPIC
Anesthesia: General | Laterality: Left

## 2022-09-02 MED ORDER — LACTATED RINGERS IV SOLN: INTRAVENOUS | Status: DC

## 2022-09-02 MED ORDER — SODIUM CHLORIDE 0.9 % IV SOLN
1.0000 g | INTRAVENOUS | Status: AC
  Administered 2022-09-02: 2 g via INTRAVENOUS
  Filled 2022-09-02: qty 10

## 2022-09-02 MED ORDER — SODIUM CHLORIDE (PF) 0.9 % IJ SOLN
INTRAMUSCULAR | Status: AC
  Filled 2022-09-02: qty 20

## 2022-09-02 MED ORDER — AMLODIPINE BESYLATE 10 MG PO TABS
10.0000 mg | ORAL_TABLET | Freq: Every day | ORAL | Status: DC
  Administered 2022-09-02 – 2022-09-03 (×2): 10 mg via ORAL
  Filled 2022-09-02 (×3): qty 1

## 2022-09-02 MED ORDER — PHENYLEPHRINE 80 MCG/ML (10ML) SYRINGE FOR IV PUSH (FOR BLOOD PRESSURE SUPPORT)
PREFILLED_SYRINGE | INTRAVENOUS | Status: DC | PRN
  Administered 2022-09-02 (×2): 80 ug via INTRAVENOUS
  Administered 2022-09-02: 160 ug via INTRAVENOUS
  Administered 2022-09-02: 80 ug via INTRAVENOUS
  Administered 2022-09-02 (×2): 160 ug via INTRAVENOUS
  Administered 2022-09-02: 80 ug via INTRAVENOUS
  Administered 2022-09-02: 120 ug via INTRAVENOUS
  Administered 2022-09-02 (×2): 80 ug via INTRAVENOUS

## 2022-09-02 MED ORDER — PHENYLEPHRINE HCL (PRESSORS) 10 MG/ML IV SOLN
INTRAVENOUS | Status: AC
  Filled 2022-09-02: qty 1

## 2022-09-02 MED ORDER — DOCUSATE SODIUM 100 MG PO CAPS: 100.0000 mg | ORAL_CAPSULE | Freq: Two times a day (BID) | ORAL | Status: DC

## 2022-09-02 MED ORDER — CHLORHEXIDINE GLUCONATE 0.12 % MT SOLN
15.0000 mL | Freq: Once | OROMUCOSAL | Status: AC
  Administered 2022-09-02: 15 mL via OROMUCOSAL

## 2022-09-02 MED ORDER — FENTANYL CITRATE PF 50 MCG/ML IJ SOSY
25.0000 ug | PREFILLED_SYRINGE | INTRAMUSCULAR | Status: DC | PRN
  Administered 2022-09-02: 50 ug via INTRAVENOUS
  Administered 2022-09-02: 25 ug via INTRAVENOUS

## 2022-09-02 MED ORDER — KETAMINE HCL 10 MG/ML IJ SOLN
INTRAMUSCULAR | Status: AC
  Filled 2022-09-02: qty 1

## 2022-09-02 MED ORDER — FENTANYL CITRATE (PF) 100 MCG/2ML IJ SOLN
INTRAMUSCULAR | Status: DC | PRN
  Administered 2022-09-02: 50 ug via INTRAVENOUS
  Administered 2022-09-02: 100 ug via INTRAVENOUS
  Administered 2022-09-02 (×2): 50 ug via INTRAVENOUS

## 2022-09-02 MED ORDER — ORAL CARE MOUTH RINSE: 15.0000 mL | OROMUCOSAL | Status: DC | PRN

## 2022-09-02 MED ORDER — LIDOCAINE HCL (PF) 2 % IJ SOLN
INTRAMUSCULAR | Status: DC | PRN
  Administered 2022-09-02: 1.5 mg/kg/h via INTRADERMAL

## 2022-09-02 MED ORDER — ROCURONIUM BROMIDE 10 MG/ML (PF) SYRINGE
PREFILLED_SYRINGE | INTRAVENOUS | Status: AC
  Filled 2022-09-02: qty 10

## 2022-09-02 MED ORDER — PANTOPRAZOLE SODIUM 20 MG PO TBEC
20.0000 mg | DELAYED_RELEASE_TABLET | Freq: Every day | ORAL | Status: DC
  Administered 2022-09-02 – 2022-09-03 (×2): 20 mg via ORAL
  Filled 2022-09-02 (×4): qty 1

## 2022-09-02 MED ORDER — ACETAMINOPHEN 500 MG PO TABS
1000.0000 mg | ORAL_TABLET | Freq: Once | ORAL | Status: AC
  Administered 2022-09-02: 1000 mg via ORAL
  Filled 2022-09-02: qty 2

## 2022-09-02 MED ORDER — ONDANSETRON HCL 4 MG/2ML IJ SOLN
INTRAMUSCULAR | Status: AC
  Filled 2022-09-02: qty 2

## 2022-09-02 MED ORDER — ROSUVASTATIN CALCIUM 5 MG PO TABS
5.0000 mg | ORAL_TABLET | Freq: Every day | ORAL | Status: DC
  Administered 2022-09-02 – 2022-09-03 (×2): 5 mg via ORAL
  Filled 2022-09-02 (×2): qty 1

## 2022-09-02 MED ORDER — 0.9 % SODIUM CHLORIDE (POUR BTL) OPTIME
TOPICAL | Status: DC | PRN
  Administered 2022-09-02: 1000 mL

## 2022-09-02 MED ORDER — STERILE WATER FOR IRRIGATION IR SOLN
Status: DC | PRN
  Administered 2022-09-02: 1000 mL

## 2022-09-02 MED ORDER — LIDOCAINE HCL 2 % IJ SOLN
INTRAMUSCULAR | Status: AC
  Filled 2022-09-02: qty 20

## 2022-09-02 MED ORDER — ORAL CARE MOUTH RINSE: 15.0000 mL | Freq: Once | OROMUCOSAL | Status: AC

## 2022-09-02 MED ORDER — PROPOFOL 10 MG/ML IV BOLUS
INTRAVENOUS | Status: AC
  Filled 2022-09-02: qty 20

## 2022-09-02 MED ORDER — PHENYLEPHRINE 80 MCG/ML (10ML) SYRINGE FOR IV PUSH (FOR BLOOD PRESSURE SUPPORT)
PREFILLED_SYRINGE | INTRAVENOUS | Status: AC
  Filled 2022-09-02: qty 10

## 2022-09-02 MED ORDER — LIDOCAINE 2% (20 MG/ML) 5 ML SYRINGE
INTRAMUSCULAR | Status: DC | PRN
  Administered 2022-09-02: 60 mg via INTRAVENOUS

## 2022-09-02 MED ORDER — DEXAMETHASONE SODIUM PHOSPHATE 10 MG/ML IJ SOLN
INTRAMUSCULAR | Status: AC
  Filled 2022-09-02: qty 1

## 2022-09-02 MED ORDER — SUGAMMADEX SODIUM 500 MG/5ML IV SOLN
INTRAVENOUS | Status: AC
  Filled 2022-09-02: qty 5

## 2022-09-02 MED ORDER — MORPHINE SULFATE (PF) 2 MG/ML IV SOLN
1.0000 mg | INTRAVENOUS | Status: DC | PRN
  Administered 2022-09-02 – 2022-09-03 (×4): 1 mg via INTRAVENOUS
  Filled 2022-09-02 (×4): qty 1

## 2022-09-02 MED ORDER — MIDAZOLAM HCL 2 MG/2ML IJ SOLN
INTRAMUSCULAR | Status: AC
  Filled 2022-09-02: qty 2

## 2022-09-02 MED ORDER — SODIUM CHLORIDE 0.9 % IV SOLN: 10.0000 mL/h | Freq: Once | INTRAVENOUS | Status: DC

## 2022-09-02 MED ORDER — OXYCODONE HCL 5 MG PO TABS: 5.0000 mg | ORAL_TABLET | Freq: Four times a day (QID) | ORAL | 0 refills | Status: DC | PRN

## 2022-09-02 MED ORDER — ACETAMINOPHEN 500 MG PO TABS
1000.0000 mg | ORAL_TABLET | Freq: Four times a day (QID) | ORAL | Status: AC
  Administered 2022-09-02 – 2022-09-03 (×4): 1000 mg via ORAL
  Filled 2022-09-02 (×4): qty 2

## 2022-09-02 MED ORDER — BUPIVACAINE LIPOSOME 1.3 % IJ SUSP
INTRAMUSCULAR | Status: AC
  Filled 2022-09-02: qty 20

## 2022-09-02 MED ORDER — LACTATED RINGERS IV SOLN: INTRAVENOUS | Status: DC | PRN

## 2022-09-02 MED ORDER — CELECOXIB 200 MG PO CAPS
200.0000 mg | ORAL_CAPSULE | Freq: Once | ORAL | Status: AC
  Administered 2022-09-02: 200 mg via ORAL
  Filled 2022-09-02: qty 1

## 2022-09-02 MED ORDER — BUPIVACAINE LIPOSOME 1.3 % IJ SUSP
INTRAMUSCULAR | Status: DC | PRN
  Administered 2022-09-02: 20 mL

## 2022-09-02 MED ORDER — ONDANSETRON HCL 4 MG/2ML IJ SOLN: 4.0000 mg | INTRAMUSCULAR | Status: DC | PRN

## 2022-09-02 MED ORDER — LEVOTHYROXINE SODIUM 50 MCG PO TABS
50.0000 ug | ORAL_TABLET | Freq: Every day | ORAL | Status: DC
  Administered 2022-09-03 – 2022-09-04 (×2): 50 ug via ORAL
  Filled 2022-09-02 (×3): qty 1

## 2022-09-02 MED ORDER — PROPOFOL 10 MG/ML IV BOLUS
INTRAVENOUS | Status: DC | PRN
  Administered 2022-09-02: 100 mg via INTRAVENOUS

## 2022-09-02 MED ORDER — FINASTERIDE 5 MG PO TABS
5.0000 mg | ORAL_TABLET | Freq: Every day | ORAL | Status: DC
  Administered 2022-09-02 – 2022-09-03 (×2): 5 mg via ORAL
  Filled 2022-09-02 (×3): qty 1

## 2022-09-02 MED ORDER — MAGNESIUM CITRATE PO SOLN
1.0000 | Freq: Once | ORAL | Status: DC
  Filled 2022-09-02: qty 296

## 2022-09-02 MED ORDER — DIPHENHYDRAMINE HCL 12.5 MG/5ML PO ELIX: 12.5000 mg | ORAL_SOLUTION | Freq: Four times a day (QID) | ORAL | Status: DC | PRN

## 2022-09-02 MED ORDER — DIPHENHYDRAMINE HCL 50 MG/ML IJ SOLN: 12.5000 mg | Freq: Four times a day (QID) | INTRAMUSCULAR | Status: DC | PRN

## 2022-09-02 MED ORDER — ROCURONIUM BROMIDE 10 MG/ML (PF) SYRINGE
PREFILLED_SYRINGE | INTRAVENOUS | Status: DC | PRN
  Administered 2022-09-02: 100 mg via INTRAVENOUS

## 2022-09-02 MED ORDER — DEXAMETHASONE SODIUM PHOSPHATE 4 MG/ML IJ SOLN
INTRAMUSCULAR | Status: DC | PRN
  Administered 2022-09-02: 8 mg via INTRAVENOUS

## 2022-09-02 MED ORDER — KCL IN DEXTROSE-NACL 10-5-0.45 MEQ/L-%-% IV SOLN
INTRAVENOUS | Status: DC
  Filled 2022-09-02 (×2): qty 1000

## 2022-09-02 MED ORDER — MIRABEGRON ER 25 MG PO TB24
50.0000 mg | ORAL_TABLET | Freq: Every day | ORAL | Status: DC
  Administered 2022-09-02 – 2022-09-03 (×2): 50 mg via ORAL
  Filled 2022-09-02 (×4): qty 2

## 2022-09-02 MED ORDER — STERILE WATER FOR IRRIGATION IR SOLN
Status: DC | PRN
  Administered 2022-09-02: 3000 mL

## 2022-09-02 MED ORDER — FENTANYL CITRATE (PF) 250 MCG/5ML IJ SOLN
INTRAMUSCULAR | Status: AC
  Filled 2022-09-02: qty 5

## 2022-09-02 MED ORDER — EPHEDRINE SULFATE (PRESSORS) 50 MG/ML IJ SOLN
INTRAMUSCULAR | Status: DC | PRN
  Administered 2022-09-02: 10 mg via INTRAVENOUS
  Administered 2022-09-02 (×5): 5 mg via INTRAVENOUS
  Administered 2022-09-02: 10 mg via INTRAVENOUS

## 2022-09-02 MED ORDER — FENTANYL CITRATE PF 50 MCG/ML IJ SOSY
PREFILLED_SYRINGE | INTRAMUSCULAR | Status: AC
  Administered 2022-09-02: 25 ug via INTRAVENOUS
  Filled 2022-09-02: qty 2

## 2022-09-02 MED ORDER — SODIUM CHLORIDE (PF) 0.9 % IJ SOLN
INTRAMUSCULAR | Status: DC | PRN
  Administered 2022-09-02: 20 mL

## 2022-09-02 MED ORDER — OXYCODONE HCL 5 MG PO TABS: 2.5000 mg | ORAL_TABLET | ORAL | Status: DC | PRN

## 2022-09-02 MED ORDER — IOHEXOL 300 MG/ML  SOLN
INTRAMUSCULAR | Status: DC | PRN
  Administered 2022-09-02: 20 mL

## 2022-09-02 MED ORDER — MIDAZOLAM HCL 5 MG/5ML IJ SOLN
INTRAMUSCULAR | Status: DC | PRN
  Administered 2022-09-02: 1 mg via INTRAVENOUS

## 2022-09-02 MED ORDER — LIDOCAINE HCL (PF) 2 % IJ SOLN
INTRAMUSCULAR | Status: AC
  Filled 2022-09-02: qty 5

## 2022-09-02 MED ORDER — OXYCODONE HCL 5 MG PO TABS
5.0000 mg | ORAL_TABLET | ORAL | Status: DC | PRN
  Administered 2022-09-02 (×2): 5 mg via ORAL
  Filled 2022-09-02 (×2): qty 1

## 2022-09-02 MED ORDER — LACTATED RINGERS IR SOLN
Status: DC | PRN
  Administered 2022-09-02: 1000 mL

## 2022-09-02 MED ORDER — EPHEDRINE 5 MG/ML INJ
INTRAVENOUS | Status: AC
  Filled 2022-09-02: qty 5

## 2022-09-02 MED ORDER — TAMSULOSIN HCL 0.4 MG PO CAPS
0.4000 mg | ORAL_CAPSULE | Freq: Every day | ORAL | Status: DC
  Administered 2022-09-02 – 2022-09-03 (×2): 0.4 mg via ORAL
  Filled 2022-09-02 (×2): qty 1

## 2022-09-02 MED ORDER — PHENYLEPHRINE HCL-NACL 20-0.9 MG/250ML-% IV SOLN
INTRAVENOUS | Status: DC | PRN
  Administered 2022-09-02: 80 ug/min via INTRAVENOUS

## 2022-09-02 MED ORDER — SODIUM CHLORIDE 0.9 % IV SOLN: INTRAVENOUS | Status: DC

## 2022-09-02 MED ORDER — SUGAMMADEX SODIUM 200 MG/2ML IV SOLN
INTRAVENOUS | Status: DC | PRN
  Administered 2022-09-02: 200 mg via INTRAVENOUS

## 2022-09-02 MED ORDER — ONDANSETRON HCL 4 MG/2ML IJ SOLN
INTRAMUSCULAR | Status: DC | PRN
  Administered 2022-09-02: 4 mg via INTRAVENOUS

## 2022-09-02 MED ORDER — SODIUM CHLORIDE 0.9 % IV SOLN: INTRAVENOUS | Status: DC | PRN

## 2022-09-02 SURGICAL SUPPLY — 73 items
ADH SKN CLS APL DERMABOND .7 (GAUZE/BANDAGES/DRESSINGS) ×2
ADH SKN CLS LQ APL DERMABOND (GAUZE/BANDAGES/DRESSINGS) ×1
APL PRP STRL LF DISP 70% ISPRP (MISCELLANEOUS) ×1
BAG COUNTER SPONGE SURGICOUNT (BAG) IMPLANT
BAG LAPAROSCOPIC 12 15 PORT 16 (BASKET) ×2 IMPLANT
BAG RETRIEVAL 12/15 (BASKET) ×1
BAG SPNG CNTER NS LX DISP (BAG)
CATH URETL OPEN END 6FR 70 (CATHETERS) IMPLANT
CHLORAPREP W/TINT 26 (MISCELLANEOUS) ×2 IMPLANT
CLIP LIGATING HEM O LOK PURPLE (MISCELLANEOUS) ×2 IMPLANT
CLIP LIGATING HEMO LOK XL GOLD (MISCELLANEOUS) ×2 IMPLANT
CLIP LIGATING HEMO O LOK GREEN (MISCELLANEOUS) ×2 IMPLANT
COVER SURGICAL LIGHT HANDLE (MISCELLANEOUS) ×2 IMPLANT
COVER TIP SHEARS 8 DVNC (MISCELLANEOUS) ×2 IMPLANT
COVER TIP SHEARS 8MM DA VINCI (MISCELLANEOUS) ×1
CUTTER ECHEON FLEX ENDO 45 340 (ENDOMECHANICALS) IMPLANT
DERMABOND ADVANCED .7 DNX12 (GAUZE/BANDAGES/DRESSINGS) ×4 IMPLANT
DERMABOND ADVANCED .7 DNX6 (GAUZE/BANDAGES/DRESSINGS) IMPLANT
DRAIN CHANNEL 15F RND FF 3/16 (WOUND CARE) IMPLANT
DRAPE ARM DVNC X/XI (DISPOSABLE) ×8 IMPLANT
DRAPE COLUMN DVNC XI (DISPOSABLE) ×2 IMPLANT
DRAPE DA VINCI XI ARM (DISPOSABLE) ×4
DRAPE DA VINCI XI COLUMN (DISPOSABLE) ×1
DRAPE INCISE IOBAN 66X45 STRL (DRAPES) ×2 IMPLANT
DRAPE SHEET LG 3/4 BI-LAMINATE (DRAPES) ×2 IMPLANT
DRSG TEGADERM 2-3/8X2-3/4 SM (GAUZE/BANDAGES/DRESSINGS) IMPLANT
ELECT PENCIL ROCKER SW 15FT (MISCELLANEOUS) ×2 IMPLANT
ELECT REM PT RETURN 15FT ADLT (MISCELLANEOUS) ×2 IMPLANT
EVACUATOR SILICONE 100CC (DRAIN) IMPLANT
GAUZE SPONGE 2X2 8PLY STRL LF (GAUZE/BANDAGES/DRESSINGS) IMPLANT
GLOVE BIO SURGEON STRL SZ 6.5 (GLOVE) ×2 IMPLANT
GLOVE SURG LX STRL 7.5 STRW (GLOVE) ×4 IMPLANT
GOWN SRG XL LVL 4 BRTHBL STRL (GOWNS) ×2 IMPLANT
GOWN STRL NON-REIN XL LVL4 (GOWNS) ×1
GUIDEWIRE STR DUAL SENSOR (WIRE) IMPLANT
IRRIG SUCT STRYKERFLOW 2 WTIP (MISCELLANEOUS) ×1
IRRIGATION SUCT STRKRFLW 2 WTP (MISCELLANEOUS) ×2 IMPLANT
KIT BASIN OR (CUSTOM PROCEDURE TRAY) ×2 IMPLANT
KIT TURNOVER KIT A (KITS) IMPLANT
LOOP VESSEL MAXI BLUE (MISCELLANEOUS) ×2 IMPLANT
MARKER SKIN DUAL TIP RULER LAB (MISCELLANEOUS) ×2 IMPLANT
NDL INSUFFLATION 14GA 120MM (NEEDLE) ×2 IMPLANT
NEEDLE INSUFFLATION 14GA 120MM (NEEDLE) ×1 IMPLANT
NS IRRIG 1000ML POUR BTL (IV SOLUTION) ×2 IMPLANT
PACK CYSTO (CUSTOM PROCEDURE TRAY) IMPLANT
PORT ACCESS TROCAR AIRSEAL 12 (TROCAR) ×2 IMPLANT
PORT ACCESS TROCAR AIRSEAL 5M (TROCAR) ×1
PROTECTOR NERVE ULNAR (MISCELLANEOUS) ×4 IMPLANT
RELOAD STAPLE 45 2.6 WHT THIN (STAPLE) IMPLANT
SEAL CANN UNIV 5-8 DVNC XI (MISCELLANEOUS) ×8 IMPLANT
SEAL XI 5MM-8MM UNIVERSAL (MISCELLANEOUS) ×4
SET TRI-LUMEN FLTR TB AIRSEAL (TUBING) ×2 IMPLANT
SOLUTION ELECTROLUBE (MISCELLANEOUS) ×2 IMPLANT
SPIKE FLUID TRANSFER (MISCELLANEOUS) ×2 IMPLANT
STAPLE RELOAD 45 WHT (STAPLE) ×5 IMPLANT
STAPLE RELOAD 45MM WHITE (STAPLE) ×5
STENT URET 6FRX26 CONTOUR (STENTS) IMPLANT
SUT ETHILON 3 0 PS 1 (SUTURE) IMPLANT
SUT MNCRL AB 4-0 PS2 18 (SUTURE) ×4 IMPLANT
SUT PDS AB 1 CT1 27 (SUTURE) ×6 IMPLANT
SUT PROLENE 4 0 PS 2 18 (SUTURE) IMPLANT
SUT VIC AB 2-0 SH 27 (SUTURE) ×1
SUT VIC AB 2-0 SH 27X BRD (SUTURE) ×2 IMPLANT
SUT VLOC BARB 180 ABS3/0GR12 (SUTURE) ×1
SUTURE VLOC BRB 180 ABS3/0GR12 (SUTURE) ×2 IMPLANT
TOWEL OR 17X26 10 PK STRL BLUE (TOWEL DISPOSABLE) ×2 IMPLANT
TOWEL OR NON WOVEN STRL DISP B (DISPOSABLE) ×2 IMPLANT
TRAY FOLEY MTR SLVR 16FR STAT (SET/KITS/TRAYS/PACK) ×2 IMPLANT
TRAY LAPAROSCOPIC (CUSTOM PROCEDURE TRAY) ×2 IMPLANT
TROCAR ADV FIXATION 12X100MM (TROCAR) ×2 IMPLANT
TROCAR UNIVERSAL OPT 12M 100M (ENDOMECHANICALS) ×2 IMPLANT
TROCAR Z-THREAD OPTICAL 5X100M (TROCAR) IMPLANT
WATER STERILE IRR 1000ML POUR (IV SOLUTION) ×2 IMPLANT

## 2022-09-02 NOTE — Brief Op Note (Signed)
09/02/2022  11:49 AM  PATIENT:  [name] [name]  88 y.o. male  PRE-OPERATIVE DIAGNOSIS:  LEFT RENAL PELVIS CANCER  POST-OPERATIVE DIAGNOSIS:  LEFT RENAL PELVIS CANCER  PROCEDURE:  Procedure(s): XI ROBOT ASSITED LAPAROSCOPIC NEPHROURETERECTOMY (Left) RETROPERITONEAL LYMPH NODE DISSECTION (Left) CYSTOSCOPY WITH RETROGRADE PYELOGRAM, URETERAL STENT PLACEMENT (Left)  SURGEON:  Surgeon(s) and Role:    Alexis Frock, MD - Primary  PHYSICIAN ASSISTANT:   ASSISTANTS: Debbrah Alar PA   ANESTHESIA:   local and general  EBL:  30 mL   BLOOD ADMINISTERED:none  DRAINS:  1 - JP to bulb; 2 - Foley to gravity    LOCAL MEDICATIONS USED:  MARCAINE     SPECIMEN:  Source of Specimen:  1 - left kidney + ureter + bladder cuff + para-aortic lymph nodes with nephrostomy and stent en bloc  DISPOSITION OF SPECIMEN:  PATHOLOGY  COUNTS:  YES  TOURNIQUET:  * No tourniquets in log *  DICTATION: .Other Dictation: Dictation Number [id number]  PLAN OF CARE: Admit to inpatient   PATIENT DISPOSITION:  PACU - hemodynamically stable.   Delay start of Pharmacological VTE agent (>24hrs) due to surgical blood loss or risk of bleeding: yes

## 2022-09-02 NOTE — Op Note (Signed)
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  PROCEDURE PERFORMED: 1.  Cystoscopy with left retrograde pyelogram interpretation. 2.  Insertion of left ureteral stent. 3.  Robotic-assisted laparoscopic left nephroureterectomy with retroperitoneal lymph node dissection.  ESTIMATED BLOOD LOSS:  50 mL  COMPLICATIONS:  None.  DRAINS:   1.  Jackson-Pratt drain to bulb suction. 2.  Foley catheter to straight drain.  FINDINGS:   1.  Single artery, single vein, left renovascular anatomy.  Several lumbar vessels entering the renal vein. 2.  In situ nephrostomy tube as expected.  This was purposely ligated both ends, inspected and intact and completely removed at the end of the surgery. 3.  Left distal stent purposely transected, both ends were retrieved and accounted for at the end of the surgery.  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  More recently left renal pelvis. His left renal pelvis tumor has been quite large  volume and very difficult to manage endoscopically despite heroic efforts at both endoscopic laser management as well as gemcitabine therapy via nephrostomy tube.  Despite these measures, his left renal pelvis tumor although low-grade remains quite  dangerous given necrotic bleeding with symptomatic anemia requiring transfusions as well as chronic obstruction of his left kidney.  He also has significant renal insufficiency at baseline, but is very compliant with medical  therapy has structurally  normal contralateral kidney.  Options discussed for further management including palliative intent protocols versus more curative protocols and given his medically and endoscopically refractory nature of disease we agreed upon the left nephroureterectomy  being most definitive management and he presents for this today.  Informed consent was obtained and placed in medical record.  He does have chronic anemia at baseline.  He understands the risks of surgery given his age and comorbidity.  PROCEDURE IN DETAIL:  The patient is being identified and verified.  Procedure being left nephroureterectomy, cystoscopy and stent placement was confirmed.  Procedure timeout was performed.  Intravenous antibiotics were administered.  General  endotracheal anesthesia was induced.  The patient initially placed into a low lithotomy position.  Sterile field was created, prepped and draped the patient's penis, perineum, and proximal thighs using iodine.  Cystourethroscopy was performed using  21-French rigid cystoscope with offset lens.  Inspection of anterior and posterior urethra revealed only a prior TUR defect of the prostate.  No papillary tumors noted in the urinary bladder.  It appeared this left ureteral orifice had been previously  resected, but was patent.  Left ureteral orifice was cannulated with open-ended catheter, and a left retrograde pyelogram was obtained.  Left retrograde pyelogram demonstrated single left ureter with left kidney with large volume filling defect consistent with known tumor.  The nephrostomy tube was in situ.  A sensory wire was advanced to lower pole which a new 6 x 26 contour type stent  was carefully placed using cystoscopic and fluoroscopic guidance and this was placed to allow a better manipulation of the ureter during ureteral dissection.  A new Foley catheter was placed per urethra to straight drain. The patient was then  repositioned  into a left side up full  flank position, pulling 15 degrees of table flexion, superior arm elevator, axillary roll sequential compression devices, bottom leg bent, top leg straight.  He was further fastened to operating table using 3-inch  tape over foam padding across supraxiphoid chest and his pelvis.  A new sterile field was created, prepped and draped the patient's entire left flank and abdomen using chlorhexidine gluconate.  His in situ nephrostomy tube was capped and also prepped in  the operative field and a high flow, low pressure, pneumoperitoneum was obtained using Veress technique in left lower quadrant, having passed the aspiration and drop test.  An 8 mm robotic camera port was then placed in position approximately one  handbreadth superolateral to the umbilicus.  Laparoscopic examination peritoneal cavity revealed no significant adhesions, no visceral injury.  Additional ports were placed as follows:  Left subcostal 8 mm robotic port, approximately 2 cm lateral to the  plane of the camera port, left far lateral 8 mm robotic port, approximately 4 fingerbreadths superomedial to the anterior superior iliac spine, left paramedian inferior robotic port in a quite medial location just lateral to the left median umbilical  ligament, positioned approximately 4 fingerbreadths superior to the pubic ramus and two 12 mm assistant port sites in the midline, one approximately 2 fingerbreadths superior to the plane of the camera port and one 2 fingerbreadths inferior. Robot was  docked and passed the electronic checks.  Initial attention was directed at development of space of Retzius.  Incision was made to the retroperitoneum.  Incision was made lateral to the descending colon from the area of the spleen towards the area of the  internal ring.  The colon was carefully swept medially and its mesentery was carefully dissected away from the anterior surface of Gerota's fascia.  Lateral splenic attachments were taken down, allowing the  spleen to rotate medially and the plane  between the posterior aspect of the kidney and the anterior surface of Gerota's fascia was developed to allow these structures to self retract medially. Lower pole kidney area was identified, placed on gentle lateral traction.  Dissection proceeded  medial to this, the ureter and gonadal vessels were encountered.  The ureter was marked with vessel loop, psoas musculature was identified.  Dissection proceeded within this triangle towards the area of the renal hilum.  A plane was chosen for medial  dissection just lateral to the aorta as on preoperative imaging. He does have some borderline adenopathy in the periaortic location.  Thus, we performed a retroperitoneal lymph node dissection with medial dissection.  Lymphostasis was achieved with cold  clips and this plane just lateral to the aorta was followed toward the renal hilum.  Renal hilum consisted of single artery, single vein, left renal vascular anatomy.  However, there were several significant lumbar veins between the left renal vein  posteriorly coursing towards the psoas musculature.  These lumbar vessels were controlled using a combination of Hem-o-lok clips and vascular stapling resulted in better angulation and visualization of the true hilum.  The artery was circumferentially  mobilized and was in the location superior to the vein and the vein was then controlled using vascular stapler quickly followed by control of the artery with Hem-o-lok clip proximal, vascular stapler distal resulted in excellent hemostatic control of the  hilum.  Further medial plane dissection was developed in a partial adrenal sparing technique using vascular stapler.  Superior attachments were taken down with cautery scissors.  This completely freed up  the medial plane kidney dissection and notably,  the periaortic lymph nodes were resected such that they are with the en bloc specimen.  Attention was directed towards the distal  ureteral dissection.  The previous posterior peritoneal incision was carried inferiorly and then anteriorly just lateral to  the left median umbilical ligament, thus creating a peritoneal floor for the distal ureteral dissection and sweeping the distal colon and sigmoid out of the plane of dissection. Using the vessel loop the ureter was carefully placed on gentle  circumferential traction and following lines of tension around the ureter was carefully released from surrounding tissue all the way to the ureterovesical junction.  I was quite happy with the safety and the completeness of this distal ureteral dissection. At the area of the  UPJ a large stay suture of 3-0 V-Loc was applied approximately 2 cm medial to the medial aspect of the ureter and bladder cuff dissection was performed using cautery scissors keeping a purposely very small bladder cuff with the area of distal ureter.  A  Hemoclip was placed over the distal ureter and stent in situ to prevent antegrade tumor spillage. During the most distal aspect dissection the distal coil of the stent was transected; however, this was retrieved, all aspects of the stent were accounted  for.  The small cystotomy was then oversewn using the 3-0 V-Loc stay suture, which revealed an excellent visual reapproximation of the small bladder cuff defect, lateral renal attachments were taken down.  This completely freed up the left kidney, plus  ureter with bladder cuff and a periaortic lymph node specimen was placed in an extra-large EndoCatch bag for later retrieval.  Abdomen was once again inspected.  Sponge and needle counts were correct.  Notably, during lateral dissection of the kidney the  in situ nephrostomy tube was identified, clipped on the renal side, transected distal aspect removed, such that all portions of the tube were accounted for.  A closed suction drain was brought out the previous left lateral most robotic site into the  peritoneal cavity.   Specimen was retrieved by extending the previous assistant port sites in the midline, this was then connected and removing the en bloc specimen setting aside for permanent pathology.  Extraction site was closed with fascia using  figure-of-eight PDS x6 followed by reapproximation of Scarpa's with running Vicryl.  All incision sites were infiltrated with dilute lipolyzed Marcaine and closed at the level of the skin using subcuticular Monocryl for Dermabond.  Procedure was  terminated.  The patient tolerated the procedure well, no immediate perioperative complications.  The patient was taken to postanesthesia care in stable condition.  Plan for inpatient admission.  Please note, first assistant, Debbrah Alar, was crucial for all portions of the surgery today.  She provided invaluable retraction, suctioning, vascular clipping, vascular stapling, robotic instrument change and general first assistance.  Please note  that the patient's preoperative anemia was clearly noted.  He was given 1 unit of blood immediately following incision and had more blood available should hemodynamics require it fortunately this was not necessary.   PUS D: 09/02/2022 12:01:09 pm T: 09/02/2022 12:48:00 pm  JOB: [id number]/ [id number]

## 2022-09-02 NOTE — Anesthesia Postprocedure Evaluation (Signed)
Anesthesia Post Note  Patient: [name] [name]  Procedure(s) Performed: XI ROBOT ASSITED LAPAROSCOPIC NEPHROURETERECTOMY (Left) RETROPERITONEAL LYMPH NODE DISSECTION (Left) CYSTOSCOPY WITH RETROGRADE PYELOGRAM, URETERAL STENT PLACEMENT (Left)     Patient location during evaluation: PACU Anesthesia Type: General Level of consciousness: awake and alert Pain management: pain level controlled Vital Signs Assessment: post-procedure vital signs reviewed and stable Respiratory status: spontaneous breathing, nonlabored ventilation, respiratory function stable and patient connected to nasal cannula oxygen Cardiovascular status: blood pressure returned to baseline and stable Postop Assessment: no apparent nausea or vomiting Anesthetic complications: no   No notable events documented.  Last Vitals:  Vitals:   09/02/22 1429 09/02/22 1815  BP: (!) 113/59 109/67  Pulse: 97 97  Resp: 16 (!) 21  Temp: (!) 36.4 C (!) 36.3 C  SpO2: 97% 94%    Last Pain:  Vitals:   09/02/22 1815  TempSrc: Oral  PainSc:                  March Rummage Luisdaniel Kenton

## 2022-09-02 NOTE — Transfer of Care (Signed)
Immediate Anesthesia Transfer of Care Note  Patient: [name] [name]  Procedure(s) Performed: Procedure(s): XI ROBOT ASSITED LAPAROSCOPIC NEPHROURETERECTOMY (Left) RETROPERITONEAL LYMPH NODE DISSECTION (Left) CYSTOSCOPY WITH RETROGRADE PYELOGRAM, URETERAL STENT PLACEMENT (Left)  Patient Location: PACU  Anesthesia Type:General  Level of Consciousness: Patient easily awoken, sedated, comfortable, cooperative, responds to stimulation.   Airway & Oxygen Therapy: Patient spontaneously breathing, ventilating well, oxygen via simple oxygen mask.  Post-op Assessment: Report given to PACU RN, vital signs reviewed and stable, moving all extremities.   Post vital signs: Reviewed and stable.  Complications: No apparent anesthesia complications Last Vitals:  Vitals Value Taken Time  BP 122/46 09/02/22 1207  Temp    Pulse 88 09/02/22 1211  Resp 13 09/02/22 1211  SpO2 91 % 09/02/22 1211  Vitals shown include unvalidated device data.  Last Pain:  Vitals:   09/02/22 0707  TempSrc:   PainSc: 0-No pain         Complications: No notable events documented.

## 2022-09-02 NOTE — Anesthesia Procedure Notes (Signed)
Procedure Name: Intubation Date/Time: 09/02/2022 9:04 AM  Performed by: Deliah Boston, CRNAPre-anesthesia Checklist: Patient identified, Emergency Drugs available, Suction available and Patient being monitored Patient Re-evaluated:Patient Re-evaluated prior to induction Oxygen Delivery Method: Circle system utilized Preoxygenation: Pre-oxygenation with 100% oxygen Induction Type: IV induction Ventilation: Mask ventilation without difficulty Laryngoscope Size: Mac and 4 Grade View: Grade I Tube type: Oral Tube size: 7.5 mm Number of attempts: 1 Airway Equipment and Method: Stylet Placement Confirmation: ETT inserted through vocal cords under direct vision, positive ETCO2 and breath sounds checked- equal and bilateral Secured at: 23 cm Tube secured with: Tape Dental Injury: Teeth and Oropharynx as per pre-operative assessment

## 2022-09-02 NOTE — H&P (Signed)
[name] [name] is an 88 y.o. male.    Chief Complaint: Pre-Op LEFT Nephrouteterectomy  HPI:   1 - Mutlifocal Urothelial Carcinoma / Bladder Cancer / Left Renal Pelvis Cancer -  2021 - TaG3 bladder cancer ==> TURBT + BCG x6  2023 - TaG1 bladder + massive volume left renal pelvis cancer (low grade) ==> left neph tube / jelmyto, endoscopic debulking.  Left Kidney - 1 artery / 1 vein left renovascular anatomy, <2cm shoddy peri-aortic adenoapthy by CT 04/2022   2 - Stage 4 Renal Insufficiency - Cr 2-3 with GFR 20s x many. Follows Dr. Debera Lat nephrology.   3 - Lower Urinary Tract Symptoms - on silodosi + finasteride at baseline, prior channel TURP   PMH sig for knee surgery, does follow with [facility] but normal stress tests, no blood thinners. 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 Widowed, lives independanly in [place], kids live very close. His PCP is [name] MD   Today "[name] [name]" is seen fto proceed with LEFT nephroureterectomy for refracotry urothelial cancer with obnstruction and significant bleeding. No interval fevers. Most recent prior Hgb 8.8, Cr 2.6.   Past Medical History:  Diagnosis Date   Anemia    Arthritis    lower back bulging disc, hips, knees, thumbs, shoulder   Asthma    as a pre teen   Bladder cancer ([place]) UROLOGIST-  DR MCKENZIE   S/P TURBT 08-16-2017   BPH (benign prostatic hyperplasia)    CKD (chronic kidney disease), stage III ([place])    STAGE 3  B CKD per lov 10-16-2020 dr Rica Mast Jeri Cos kidney on chart   Dyspnea    Fatigue    MIDDLE OF DAY ON OCCASION   Frequency of urination    GERD (gastroesophageal reflux disease)    on protonix   Hematuria    Hematuria    History of colon polyps    HLD (hyperlipidemia)    HOH (hard of hearing)    slightly hoh right worse thanleft   HTN (hypertension)    Hypothyroidism    Incontinence of urine    weras pads all the time   Pigmented basal cell  carcinoma (BCC) 03/26/2021   Right Buccal Cheek   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-- urologist- dr Alyson Ingles   s/p  TURBT 08-16-2017  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   Sleep apnea    uses cpap set on 20 /4   Trigger finger of left hand 07/04/2021   last few months per pt   UTI (urinary tract infection) 08/20/2020   UTI (urinary tract infection)    finished ampicillin on 06-13-2021 all symptoms resolved   Weakness of extremity    left knee   Wears glasses     Past Surgical History:  Procedure Laterality Date   BCG X 2     AFTER LAST 2 BLADDER TUMOR REMOVALS   CATARACT EXTRACTION, BILATERAL     COLONSCOPY  YRS AGO   CYSTOSCOPY N/A 10/29/2020   Procedure: CYSTOSCOPY;  Surgeon: Robley Fries, MD;  Location: [place];  Service: Urology;  Laterality: N/A;   CYSTOSCOPY N/A 07/08/2021   Procedure: CYSTOSCOPY;  Surgeon: Robley Fries, MD;  Location[facility];  Service: Urology;  Laterality: N/A;   CYSTOSCOPY W/ URETERAL STENT PLACEMENT Bilateral 08/16/2017   Procedure: CYSTOSCOPY WITH RETROGRADE PYELOGRAM/URETERAL STENT PLACEMENT;  Surgeon: Cleon Gustin, MD;  Location:  [place];  Service: Urology;  Laterality: Bilateral;   CYSTOSCOPY WITH BIOPSY N/A 06/25/2020   Procedure: CYSTOSCOPY FULGURATION   BLADDER  BIOPSY;  Surgeon: Robley Fries, MD;  Location: [facility];  Service: Urology;  Laterality: N/A;   CYSTOSCOPY WITH FULGERATION N/A 12/08/2019   Procedure: CYSTOSCOPY WITH FULGERATION;  Surgeon: Cleon Gustin, MD;  Location: [facility];  Service: Urology;  Laterality: N/A;   CYSTOSCOPY WITH FULGERATION N/A 03/24/2022   Procedure: CYSTOSCOPY WITH FULGERATION;  Surgeon: Robley Fries, MD;  Location[facility];  Service: Urology;  Laterality: N/A;   CYSTOSCOPY WITH RETROGRADE PYELOGRAM, URETEROSCOPY AND STENT PLACEMENT Left  03/24/2022   Procedure: CYSTOSCOPY WITH RETROGRADE PYELOGRAM, DIAGNOSTIC URETEROSCOPY WITH BIOPSY AND STENT PLACEMENT;  Surgeon: Robley Fries, MD;  Location: [facility];  Service: Urology;  Laterality: Left;  90 MINUTES   CYSTOSCOPY WITH RETROGRADE PYELOGRAM, URETEROSCOPY AND STENT PLACEMENT Left 04/07/2022   Procedure: CYSTOSCOPY WITH  RETROGRADE PYELOGRAM, DIAGNOSTIC URETEROSCOPY WITH BIOPSY AND STENT EXCHANGE;  Surgeon: Robley Fries, MD;  Location[facility];  Service: Urology;  Laterality: Left;  75 MINUTES   CYSTOSCOPY WITH RETROGRADE PYELOGRAM, URETEROSCOPY AND STENT PLACEMENT Left 08/11/2022   Procedure: CYSTOSCOPY WITH RETROGRADE PYELOGRAM, ANTEGRADE NEPHROSTOGRAM, DIAGNOSTIC  URETEROSCOPY;  Surgeon: Robley Fries, MD;  Location[facility];  Service: Urology;  Laterality: Left;  90 MINS   HOLMIUM LASER APPLICATION Left 05/29/2375   Procedure: HOLMIUM LASER APPLICATION;  Surgeon: Robley Fries, MD;  Location[facility];  Service: Urology;  Laterality: Left;   IR NEPHROSTOMY EXCHANGE LEFT  05/15/2022   IR NEPHROSTOMY EXCHANGE LEFT  07/10/2022   IR NEPHROSTOMY PLACEMENT LEFT  05/06/2022   KNEE ARTHROSCOPY Left 06/10/2011   PROSTATE BIOPSY  2000   TRANSURETHRAL RESECTION OF BLADDER TUMOR N/A 08/16/2017   Procedure: TRANSURETHRAL RESECTION OF BLADDER TUMOR (TURBT);  Surgeon: Cleon Gustin, MD;  Location: [facility];  Service: Urology;  Laterality: N/A;   TRANSURETHRAL RESECTION OF BLADDER TUMOR  12-09-2012   dr Alona Bene [facility]   and TURP   TRANSURETHRAL RESECTION OF BLADDER TUMOR N/A 09/20/2017   Procedure: TRANSURETHRAL RESECTION OF BLADDER TUMOR (TURBT);  Surgeon: Cleon Gustin, MD;  [facility];  Service: Urology;  Laterality: N/A;   TRANSURETHRAL RESECTION OF BLADDER TUMOR N/A 12/08/2019   Procedure: TRANSURETHRAL RESECTION OF BLADDER TUMOR;  Surgeon: Cleon Gustin, MD;  Location: [facility];  Service: Urology;   Laterality: N/A;  1 HR   TRANSURETHRAL RESECTION OF BLADDER TUMOR N/A 10/29/2020   Procedure: BLADDER FULGERATION;  Surgeon: Robley Fries, MD;  Location: [facility];  Service: Urology;  Laterality: N/A;  30 MINS   TRANSURETHRAL RESECTION OF BLADDER TUMOR N/A 07/08/2021   Procedure: TRANSURETHRAL RESECTION OF BLADDER TUMOR (TURBT)/FULGERATION;  Surgeon: Robley Fries, MD;  Location: [facility];  Service: Urology;  Laterality: N/A;  1 MINS   TRANSURETHRAL RESECTION OF BLADDER TUMOR N/A 12/02/2021   Procedure: TRANSURETHRAL RESECTION OF BLADDER TUMOR (TURBT);  Surgeon: Robley Fries, MD;  [facility];  Service: Urology;  Laterality: N/A;   TRANSURETHRAL RESECTION OF PROSTATE  2011   UPPER GI ENDOSCOPY  YRS AGO    Family History  Problem Relation Age of Onset   Hypertension Mother    Breast cancer Mother    Other Mother        brain tumor   Alzheimer's disease Mother  Emphysema Father        smoker   Social History:  reports that he has never smoked. He has never used smokeless tobacco. He reports that he does not currently use alcohol after a past usage of about 1.0 - 2.0 standard drink of alcohol per week. He reports that he does not use drugs.  Allergies:  Allergies  Allergen Reactions   Macrobid [Nitrofurantoin] Shortness Of Breath   Ciprofloxacin Diarrhea    Occurred with '750mg'$  dose.  Has since taken '500mg'$  doses and has had no reaction/diarrhea.   Tramadol Hcl     Pt unsure of reaction    Cephalexin Rash    Rash on arms   [name] [Mitomycin] Rash    All over body    Medications Prior to Admission  Medication Sig Dispense Refill   amLODipine (NORVASC) 10 MG tablet Take 1 tablet (10 mg total) by mouth daily. (Patient taking differently: Take 10 mg by mouth at bedtime.) 90 tablet 1   finasteride (PROSCAR) 5 MG tablet TAKE ONE TABLET BY MOUTH DAILY (Patient taking differently: Take 5 mg by mouth at bedtime.) 90 tablet 3    irbesartan (AVAPRO) 75 MG tablet Take 37.5 mg by mouth daily at 12 noon.     levothyroxine (SYNTHROID, LEVOTHROID) 50 MCG tablet Take 50 mcg by mouth daily before breakfast.     mirabegron ER (MYRBETRIQ) 50 MG TB24 tablet Take 50 mg by mouth at bedtime.     pantoprazole (PROTONIX) 20 MG tablet Take 20 mg by mouth at bedtime.     rosuvastatin (CRESTOR) 5 MG tablet Take 5 mg by mouth at bedtime.     silodosin (RAPAFLO) 8 MG CAPS capsule TAKE ONE CAPSULE BY MOUTH EVERY NIGHT AT BEDTIME 90 capsule 3   triamcinolone ointment (KENALOG) 0.1 % Apply 1 Application topically daily as needed for rash.     acetaminophen (TYLENOL) 500 MG tablet Take 1,000 mg by mouth every 6 (six) hours as needed for moderate pain.     aspirin 81 MG tablet Take 81 mg by mouth at bedtime.      ferrous sulfate 325 (65 FE) MG tablet Take 325 mg by mouth daily with breakfast.     oxyCODONE (ROXICODONE) 5 MG immediate release tablet Take 1 tablet (5 mg total) by mouth every 8 (eight) hours as needed. 20 tablet 0   psyllium (METAMUCIL) 58.6 % powder Take 0.5 packets by mouth every evening.      No results found for this or any previous visit (from the past 48 hour(s)). No results found.  Review of Systems  Constitutional:  Negative for chills and fever.  All other systems reviewed and are negative.   Blood pressure 129/60, pulse 87, temperature 98 F (36.7 C), temperature source Oral, resp. rate 18, height '5\' 8"'$  (1.727 m), weight 77.1 kg, SpO2 100 %. Physical Exam Vitals reviewed.  HENT:     Head: Normocephalic.  Eyes:     Pupils: Pupils are equal, round, and reactive to light.  Cardiovascular:     Rate and Rhythm: Normal rate.  Abdominal:     General: Abdomen is flat.  Genitourinary:    Comments: Left neph tube in place with thin bloody urine.  Musculoskeletal:        General: Normal range of motion.  Neurological:     Mental Status: He is alert.  Psychiatric:        Mood and Affect: Mood normal.       Assessment/Plan  Proceed as planned with LEFT nephroureterectomy. Risks (including non-cure, mortality, progression to ESRD), benefits, alternatives, expected peri-op course frankly discussed previously and reiteratd today. He understands that his age and comorbidity increases risk of ALL peri-op complicaitons by at least 10X.   Alexis Frock, MD 09/02/2022, 6:58 AM

## 2022-09-02 NOTE — Discharge Instructions (Signed)
1- Drain Sites - You may have some mild persistent drainage from old drain site for several days, this is normal. This can be covered with cotton gauze for convenience.  2 - Stiches - Your stitches are all dissolvable. You may notice a "loose thread" at your incisions, these are normal and require no intervention. You may cut them flush to the skin with fingernail clippers if needed for comfort.  3 - Diet - No restrictions  4 - Activity - No heavy lifting / straining (any activities that require valsalva or "bearing down") x 4 weeks. Otherwise, no restrictions.  5 - Bathing - You may shower immediately. Do not take a bath or get into swimming pool where incision sites are submersed in water x 4 weeks.   6 - Catheter - Will remain in place until removed at your next appointment. It may be cleaned with soap and water in the shower. It may be disconnected from the drain bag while in the shower to avoid tripping over the tube. You may apply Neosporin or Vaseline ointment as needed to the tip of the penis where the catheter inserts to reduce friction and irritation in this spot.   7 - When to Call the Doctor - Call MD for any fever >102, any acute wound problems, or any severe nausea / vomiting. You can call the [facility] ([phone]) 24 hours a day 365 days a year. It will roll-over to the answering service and on-call physician after hours.  

## 2022-09-03 ENCOUNTER — Encounter (HOSPITAL_COMMUNITY): Payer: Self-pay | Admitting: Urology

## 2022-09-03 LAB — HEMOGLOBIN AND HEMATOCRIT, BLOOD
HCT: 26.4 % — ABNORMAL LOW (ref 39.0–52.0)
Hemoglobin: 8.6 g/dL — ABNORMAL LOW (ref 13.0–17.0)

## 2022-09-03 LAB — BASIC METABOLIC PANEL
Anion gap: 7 (ref 5–15)
BUN: 40 mg/dL — ABNORMAL HIGH (ref 8–23)
CO2: 17 mmol/L — ABNORMAL LOW (ref 22–32)
Calcium: 8.1 mg/dL — ABNORMAL LOW (ref 8.9–10.3)
Chloride: 111 mmol/L (ref 98–111)
Creatinine, Ser: 3 mg/dL — ABNORMAL HIGH (ref 0.61–1.24)
GFR, Estimated: 19 mL/min — ABNORMAL LOW (ref >60)
Glucose, Bld: 150 mg/dL — ABNORMAL HIGH (ref 70–99)
Potassium: 4.6 mmol/L (ref 3.5–5.1)
Sodium: 135 mmol/L (ref 135–145)

## 2022-09-03 MED ORDER — OXYCODONE HCL 5 MG PO TABS: 10.0000 mg | ORAL_TABLET | ORAL | Status: DC | PRN

## 2022-09-03 MED ORDER — OXYCODONE HCL 5 MG PO TABS
5.0000 mg | ORAL_TABLET | ORAL | Status: DC | PRN
  Administered 2022-09-03 – 2022-09-04 (×5): 5 mg via ORAL
  Filled 2022-09-03 (×5): qty 1

## 2022-09-03 NOTE — Progress Notes (Signed)
  Transition of Care [facility]) Screening Note   Patient Details  Name: [name] [name] Date of Birth: [birth year]   Transition of Care [facility]) CM/SW Contact:    Dessa Phi, RN Phone Number: 09/03/2022, 10:45 AM    Transition of Care Department [facility]) has reviewed patient and no TOC needs have been identified at this time. We will continue to monitor patient advancement through interdisciplinary progression rounds. If new patient transition needs arise, please place a TOC consult.

## 2022-09-03 NOTE — Progress Notes (Signed)
   09/02/22 2330  Mobility  Activity Ambulated with assistance in hallway  Range of Motion/Exercises Active;All extremities  Level of Assistance Contact guard assist, steadying assist  Assistive Device Front wheel walker  Distance Ambulated (ft) 100 ft  Activity Response Tolerated fair

## 2022-09-03 NOTE — Progress Notes (Signed)
1 Day Post-Op Subjective: No acute events overnight.  Doing well. Pain controlled with PRNs. Able to ambulate the unit last night. Tolerating clear liquids without nausea or emesis. Making adequate urine via foley catheter.  Objective: Vital signs in last 24 hours: Temp:  [96.7 F (35.9 C)-97.5 F (36.4 C)] 97.4 F (36.3 C) (10/12 0604) Pulse Rate:  [82-98] 84 (10/12 0604) Resp:  [12-23] 20 (10/12 0604) BP: (88-122)/(41-69) 113/61 (10/12 0604) SpO2:  [88 %-100 %] 100 % (10/12 0604) Weight:  [77.1 kg] 77.1 kg (10/11 0707)  Intake/Output from previous day: 10/11 0701 - 10/12 0700 In: 3187.2 [P.O.:430; I.V.:2242.2; Blood:315; IV Piggyback:200] Out: 720 [Urine:650; Drains:40; Blood:30] Intake/Output this shift: Total I/O In: 625.1 [P.O.:240; I.V.:385.1] Out: 58 [Urine:450; Drains:40]  Physical Exam:  General: Alert and oriented CV: Regular rate Lungs: Normal work of breathing on room air Abdomen: Soft, Non-distended, appropriately tender to palpation. LLQ JP with serosanguinous output Incisions: clean, dry, intact with dermabond in place Ext: NT, No erythema  Lab Results: Recent Labs    09/02/22 0748 09/02/22 1244 09/03/22 0441  HGB 8.0* 9.4* 8.6*  HCT 24.9* 29.8* 26.4*   BMET Recent Labs    09/02/22 1244 09/03/22 0441  NA 139 135  K 3.9 4.6  CL 112* 111  CO2 16* 17*  GLUCOSE 161* 150*  BUN 41* 40*  CREATININE 2.98* 3.00*  CALCIUM 8.6* 8.1*     Studies/Results: DG C-Arm 1-60 Min-No Report  Result Date: 09/02/2022 Fluoroscopy was utilized by the requesting physician.  No radiographic interpretation.    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    LOS: 1 day   [name] [name] [name] [name] 09/03/2022, 6:59 AM

## 2022-09-04 ENCOUNTER — Other Ambulatory Visit (HOSPITAL_COMMUNITY): Payer: Medicare Other

## 2022-09-04 LAB — BASIC METABOLIC PANEL
Anion gap: 8 (ref 5–15)
BUN: 45 mg/dL — ABNORMAL HIGH (ref 8–23)
CO2: 20 mmol/L — ABNORMAL LOW (ref 22–32)
Calcium: 8.2 mg/dL — ABNORMAL LOW (ref 8.9–10.3)
Chloride: 111 mmol/L (ref 98–111)
Creatinine, Ser: 2.96 mg/dL — ABNORMAL HIGH (ref 0.61–1.24)
GFR, Estimated: 20 mL/min — ABNORMAL LOW (ref >60)
Glucose, Bld: 108 mg/dL — ABNORMAL HIGH (ref 70–99)
Potassium: 4.8 mmol/L (ref 3.5–5.1)
Sodium: 139 mmol/L (ref 135–145)

## 2022-09-04 LAB — HEMOGLOBIN AND HEMATOCRIT, BLOOD
HCT: 28.3 % — ABNORMAL LOW (ref 39.0–52.0)
Hemoglobin: 9.1 g/dL — ABNORMAL LOW (ref 13.0–17.0)

## 2022-09-04 MED ORDER — SENNOSIDES-DOCUSATE SODIUM 8.6-50 MG PO TABS: 1.0000 | ORAL_TABLET | Freq: Two times a day (BID) | ORAL | 0 refills | Status: AC

## 2022-09-04 MED ORDER — BISACODYL 10 MG RE SUPP
10.0000 mg | Freq: Once | RECTAL | Status: AC
  Administered 2022-09-04: 10 mg via RECTAL
  Filled 2022-09-04: qty 1

## 2022-09-04 NOTE — Progress Notes (Signed)
2 Days Post-Op Subjective: No acute events overnight.  Doing well. Pain controlled with PRNs. Has ambulated multiple times without issue. Tolerating regular diet without nausea or emesis. Making adequate urine. Hgb 9.1, Cr 2.96  Objective: Vital signs in last 24 hours: Temp:  [97.6 F (36.4 C)-97.8 F (36.6 C)] 97.6 F (36.4 C) (10/13 0448) Pulse Rate:  [80-88] 86 (10/13 0448) Resp:  [16-18] 17 (10/13 0448) BP: (112-128)/(59-68) 125/67 (10/13 0448) SpO2:  [94 %-100 %] 98 % (10/13 0448)  Intake/Output from previous day: 10/12 0701 - 10/13 0700 In: 240 [P.O.:240] Out: 1220 [Urine:1200; Drains:20] Intake/Output this shift: Total I/O In: -  Out: 1220 [Urine:1200; Drains:20]  Physical Exam:  General: Alert and oriented CV: Regular rate Lungs: Normal work of breathing on room air Abdomen: Soft, Non-distended, appropriately tender to palpation. LLQ JP with minimal serosanguinous output Incisions: clean, dry, intact with dermabond in place Ext: NT, No erythema  Lab Results: Recent Labs    09/02/22 1244 09/03/22 0441 09/04/22 0424  HGB 9.4* 8.6* 9.1*  HCT 29.8* 26.4* 28.3*    BMET Recent Labs    09/03/22 0441 09/04/22 0424  NA 135 139  K 4.6 4.8  CL 111 111  CO2 17* 20*  GLUCOSE 150* 108*  BUN 40* 45*  CREATININE 3.00* 2.96*  CALCIUM 8.1* 8.2*      Studies/Results: DG C-Arm 1-60 Min-No Report  Result Date: 09/02/2022 Fluoroscopy was utilized by the requesting physician.  No radiographic interpretation.    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 - If continues to do well today, possible discharge later  today.    LOS: 2 days   Cylan Borum Fatmata Legere 09/04/2022, 6:50 AM

## 2022-09-04 NOTE — Plan of Care (Signed)

## 2022-09-04 NOTE — Progress Notes (Signed)
Pt ambulated 541f with walker, no pain med given prior to ambulation. Pt tolerated well.

## 2022-09-04 NOTE — Discharge Summary (Signed)
Physician Discharge Summary  Patient ID: [name] [name] MRN: [id number] DOB/AGE: 86-Jul-1933 86 y.o.  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 Path pending at discharge.   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 S/p left nephrectomy as per above. Cr POD 2 stable at 2.9 with normal potassium and UOP.    3 - Chronic Anemia - Hgb 8's at baseline from chronically bleeding left kidney pre-nephrectomy. Hgb 9 at discharge.    By the afternoon of POD 2, the day of discharge, he is ambulatory, pain controlled on PO meds, maintaining PO nutrition with resumed bowel function, and felt to be adeuate for discharge. Foley continued at discharge.   Consults: None  Significant Diagnostic Studies: labs: as per above  Treatments: surgery: as per above  Discharge Exam: Blood pressure (!) 117/59, pulse 95, temperature 98 F (36.7 C), temperature source Oral, resp. rate 16, height '5\' 8"'$  (1.727 m), weight 77.1 kg, SpO2 93 %.  NAD, AOx3, sitting in chair Non-labored breathing on RA RRR SNTND, recnet surgical sites c/d/I, JP non-foul serosanguinous, removed and dry dressing placed.  Foley with clear yellow urine No c/c/e  Disposition: HOME   Allergies as of 09/04/2022       Reactions   Macrobid [nitrofurantoin] Shortness Of Breath   Ciprofloxacin Diarrhea   Occurred with '750mg'$  dose.  Has since taken '500mg'$  doses and has had no reaction/diarrhea.   Tramadol Hcl    Pt unsure of reaction    Cephalexin Rash   Rash on arms   [name] [mitomycin] Rash   All over body        Medication List      STOP taking these medications    aspirin 81 MG tablet       TAKE these medications    acetaminophen 500 MG tablet Commonly known as: TYLENOL Take 1,000 mg by mouth every 6 (six) hours as needed for moderate pain.   amLODipine 10 MG tablet Commonly known as: NORVASC Take 1 tablet (10 mg total) by mouth daily. What changed: when to take this   ferrous sulfate 325 (65 FE) MG tablet Take 325 mg by mouth daily with breakfast.   finasteride 5 MG tablet Commonly known as: PROSCAR TAKE ONE TABLET BY MOUTH DAILY What changed: when to take this   irbesartan 75 MG tablet Commonly known as: AVAPRO Take 37.5 mg by mouth daily at 12 noon.   levothyroxine 50 MCG tablet Commonly known as: SYNTHROID Take 50 mcg by mouth daily before breakfast.   mirabegron ER 50 MG Tb24 tablet Commonly known as: MYRBETRIQ Take 50 mg by mouth at bedtime.   oxyCODONE 5 MG immediate release tablet Commonly known as: Roxicodone Take 1-2 tablets (5-10 mg total) by mouth every 6 (six) hours as needed for moderate pain or severe pain. What changed:  how much to take when to take this reasons to take this   pantoprazole 20 MG tablet Commonly known as: PROTONIX Take 20 mg by mouth at bedtime.   psyllium 58.6 % powder Commonly known as: METAMUCIL Take 0.5 packets by mouth every evening.   rosuvastatin 5 MG tablet Commonly known as:  CRESTOR Take 5 mg by mouth at bedtime.   senna-docusate 8.6-50 MG tablet Commonly known as: Senokot-S Take 1 tablet by mouth 2 (two) times daily. While taking strong pain meds to prevent constipation   silodosin 8 MG Caps capsule Commonly known as: RAPAFLO TAKE ONE CAPSULE BY MOUTH EVERY NIGHT AT BEDTIME   triamcinolone ointment 0.1 % Commonly known as: KENALOG Apply 1 Application topically daily as needed for rash.        Follow-up Information     Alexis Frock, MD Follow up on 09/15/2022.   Specialty: Urology Why: at 10:30 for [facility] and catheter  removal. Contact information: [place] [address] [phone]                 Signed: Alexis Frock 09/04/2022, 3:42 PM

## 2022-09-06 LAB — BPAM RBC
Blood Product Expiration Date: 202310292359
Blood Product Expiration Date: 202311042359
ISSUE DATE / TIME: 202310110908
ISSUE DATE / TIME: 202310110908
Unit Type and Rh: 6200
Unit Type and Rh: 6200

## 2022-09-06 LAB — TYPE AND SCREEN
ABO/RH(D): A POS
Antibody Screen: NEGATIVE
Unit division: 0
Unit division: 0

## 2022-09-06 LAB — SURGICAL PATHOLOGY

## 2022-09-15 DIAGNOSIS — C662 Malignant neoplasm of left ureter: Secondary | ICD-10-CM | POA: Diagnosis not present

## 2022-09-15 DIAGNOSIS — C678 Malignant neoplasm of overlapping sites of bladder: Secondary | ICD-10-CM | POA: Diagnosis not present

## 2022-09-18 DIAGNOSIS — I129 Hypertensive chronic kidney disease with stage 1 through stage 4 chronic kidney disease, or unspecified chronic kidney disease: Secondary | ICD-10-CM | POA: Diagnosis not present

## 2022-09-18 DIAGNOSIS — R778 Other specified abnormalities of plasma proteins: Secondary | ICD-10-CM | POA: Diagnosis not present

## 2022-09-18 DIAGNOSIS — R319 Hematuria, unspecified: Secondary | ICD-10-CM | POA: Diagnosis not present

## 2022-09-18 DIAGNOSIS — Z905 Acquired absence of kidney: Secondary | ICD-10-CM | POA: Diagnosis not present

## 2022-09-18 DIAGNOSIS — E875 Hyperkalemia: Secondary | ICD-10-CM | POA: Diagnosis not present

## 2022-09-18 DIAGNOSIS — N2581 Secondary hyperparathyroidism of renal origin: Secondary | ICD-10-CM | POA: Diagnosis not present

## 2022-09-18 DIAGNOSIS — N184 Chronic kidney disease, stage 4 (severe): Secondary | ICD-10-CM | POA: Diagnosis not present

## 2022-09-18 DIAGNOSIS — C679 Malignant neoplasm of bladder, unspecified: Secondary | ICD-10-CM | POA: Diagnosis not present

## 2022-09-18 DIAGNOSIS — D509 Iron deficiency anemia, unspecified: Secondary | ICD-10-CM | POA: Diagnosis not present

## 2022-10-07 ENCOUNTER — Ambulatory Visit: Payer: Medicare Other | Admitting: Dermatology

## 2022-10-12 DIAGNOSIS — G4737 Central sleep apnea in conditions classified elsewhere: Secondary | ICD-10-CM | POA: Diagnosis not present

## 2022-10-12 DIAGNOSIS — G4733 Obstructive sleep apnea (adult) (pediatric): Secondary | ICD-10-CM | POA: Diagnosis not present

## 2022-10-14 ENCOUNTER — Ambulatory Visit: Payer: Medicare Other | Admitting: Dermatology

## 2022-10-22 DIAGNOSIS — E039 Hypothyroidism, unspecified: Secondary | ICD-10-CM | POA: Diagnosis not present

## 2022-10-22 DIAGNOSIS — I129 Hypertensive chronic kidney disease with stage 1 through stage 4 chronic kidney disease, or unspecified chronic kidney disease: Secondary | ICD-10-CM | POA: Diagnosis not present

## 2022-10-22 DIAGNOSIS — E782 Mixed hyperlipidemia: Secondary | ICD-10-CM | POA: Diagnosis not present

## 2022-10-22 DIAGNOSIS — N1832 Chronic kidney disease, stage 3b: Secondary | ICD-10-CM | POA: Diagnosis not present

## 2022-10-29 DIAGNOSIS — E782 Mixed hyperlipidemia: Secondary | ICD-10-CM | POA: Diagnosis not present

## 2022-10-29 DIAGNOSIS — N184 Chronic kidney disease, stage 4 (severe): Secondary | ICD-10-CM | POA: Diagnosis not present

## 2022-10-29 DIAGNOSIS — I700 Atherosclerosis of aorta: Secondary | ICD-10-CM | POA: Diagnosis not present

## 2022-10-29 DIAGNOSIS — I129 Hypertensive chronic kidney disease with stage 1 through stage 4 chronic kidney disease, or unspecified chronic kidney disease: Secondary | ICD-10-CM | POA: Diagnosis not present

## 2022-11-05 DIAGNOSIS — E782 Mixed hyperlipidemia: Secondary | ICD-10-CM | POA: Diagnosis not present

## 2022-11-05 DIAGNOSIS — D649 Anemia, unspecified: Secondary | ICD-10-CM | POA: Diagnosis not present

## 2022-11-05 DIAGNOSIS — I129 Hypertensive chronic kidney disease with stage 1 through stage 4 chronic kidney disease, or unspecified chronic kidney disease: Secondary | ICD-10-CM | POA: Diagnosis not present

## 2022-11-05 DIAGNOSIS — E039 Hypothyroidism, unspecified: Secondary | ICD-10-CM | POA: Diagnosis not present

## 2022-11-05 DIAGNOSIS — I700 Atherosclerosis of aorta: Secondary | ICD-10-CM | POA: Diagnosis not present

## 2022-11-05 DIAGNOSIS — R600 Localized edema: Secondary | ICD-10-CM | POA: Diagnosis not present

## 2022-11-05 DIAGNOSIS — R7309 Other abnormal glucose: Secondary | ICD-10-CM | POA: Diagnosis not present

## 2022-11-05 DIAGNOSIS — D508 Other iron deficiency anemias: Secondary | ICD-10-CM | POA: Diagnosis not present

## 2022-11-05 DIAGNOSIS — N184 Chronic kidney disease, stage 4 (severe): Secondary | ICD-10-CM | POA: Diagnosis not present

## 2022-11-05 DIAGNOSIS — I10 Essential (primary) hypertension: Secondary | ICD-10-CM | POA: Diagnosis not present

## 2022-11-05 DIAGNOSIS — R5383 Other fatigue: Secondary | ICD-10-CM | POA: Diagnosis not present

## 2022-12-09 DIAGNOSIS — N1832 Chronic kidney disease, stage 3b: Secondary | ICD-10-CM | POA: Diagnosis not present

## 2022-12-18 DIAGNOSIS — N184 Chronic kidney disease, stage 4 (severe): Secondary | ICD-10-CM | POA: Diagnosis not present

## 2022-12-18 DIAGNOSIS — E875 Hyperkalemia: Secondary | ICD-10-CM | POA: Diagnosis not present

## 2022-12-18 DIAGNOSIS — E872 Acidosis, unspecified: Secondary | ICD-10-CM | POA: Diagnosis not present

## 2022-12-18 DIAGNOSIS — C679 Malignant neoplasm of bladder, unspecified: Secondary | ICD-10-CM | POA: Diagnosis not present

## 2022-12-18 DIAGNOSIS — I129 Hypertensive chronic kidney disease with stage 1 through stage 4 chronic kidney disease, or unspecified chronic kidney disease: Secondary | ICD-10-CM | POA: Diagnosis not present

## 2022-12-18 DIAGNOSIS — Z905 Acquired absence of kidney: Secondary | ICD-10-CM | POA: Diagnosis not present

## 2022-12-18 DIAGNOSIS — D509 Iron deficiency anemia, unspecified: Secondary | ICD-10-CM | POA: Diagnosis not present

## 2022-12-18 DIAGNOSIS — N2581 Secondary hyperparathyroidism of renal origin: Secondary | ICD-10-CM | POA: Diagnosis not present

## 2022-12-23 DIAGNOSIS — E039 Hypothyroidism, unspecified: Secondary | ICD-10-CM | POA: Diagnosis not present

## 2022-12-23 DIAGNOSIS — E782 Mixed hyperlipidemia: Secondary | ICD-10-CM | POA: Diagnosis not present

## 2022-12-23 DIAGNOSIS — N1832 Chronic kidney disease, stage 3b: Secondary | ICD-10-CM | POA: Diagnosis not present

## 2022-12-23 DIAGNOSIS — I129 Hypertensive chronic kidney disease with stage 1 through stage 4 chronic kidney disease, or unspecified chronic kidney disease: Secondary | ICD-10-CM | POA: Diagnosis not present

## 2023-01-11 DIAGNOSIS — G4737 Central sleep apnea in conditions classified elsewhere: Secondary | ICD-10-CM | POA: Diagnosis not present

## 2023-01-11 DIAGNOSIS — G4733 Obstructive sleep apnea (adult) (pediatric): Secondary | ICD-10-CM | POA: Diagnosis not present

## 2023-02-11 DIAGNOSIS — M25512 Pain in left shoulder: Secondary | ICD-10-CM | POA: Diagnosis not present

## 2023-03-05 DIAGNOSIS — H6120 Impacted cerumen, unspecified ear: Secondary | ICD-10-CM | POA: Diagnosis not present

## 2023-03-05 DIAGNOSIS — M6281 Muscle weakness (generalized): Secondary | ICD-10-CM | POA: Diagnosis not present

## 2023-03-05 DIAGNOSIS — R2689 Other abnormalities of gait and mobility: Secondary | ICD-10-CM | POA: Diagnosis not present

## 2023-03-15 DIAGNOSIS — C678 Malignant neoplasm of overlapping sites of bladder: Secondary | ICD-10-CM | POA: Diagnosis not present

## 2023-03-16 ENCOUNTER — Other Ambulatory Visit (HOSPITAL_COMMUNITY): Payer: Self-pay | Admitting: Urology

## 2023-03-16 ENCOUNTER — Ambulatory Visit (HOSPITAL_COMMUNITY)
Admission: RE | Admit: 2023-03-16 | Discharge: 2023-03-16 | Disposition: A | Discharge status: Home / Self Care | Payer: Medicare Other | Source: Ambulatory Visit | Attending: Urology | Admitting: Urology

## 2023-03-16 DIAGNOSIS — C678 Malignant neoplasm of overlapping sites of bladder: Secondary | ICD-10-CM

## 2023-03-16 DIAGNOSIS — R079 Chest pain, unspecified: Secondary | ICD-10-CM | POA: Diagnosis not present

## 2023-03-17 DIAGNOSIS — K5730 Diverticulosis of large intestine without perforation or abscess without bleeding: Secondary | ICD-10-CM | POA: Diagnosis not present

## 2023-03-17 DIAGNOSIS — C669 Malignant neoplasm of unspecified ureter: Secondary | ICD-10-CM | POA: Diagnosis not present

## 2023-03-17 DIAGNOSIS — C678 Malignant neoplasm of overlapping sites of bladder: Secondary | ICD-10-CM | POA: Diagnosis not present

## 2023-03-22 DIAGNOSIS — C678 Malignant neoplasm of overlapping sites of bladder: Secondary | ICD-10-CM | POA: Diagnosis not present

## 2023-04-07 DIAGNOSIS — R7309 Other abnormal glucose: Secondary | ICD-10-CM | POA: Diagnosis not present

## 2023-04-07 DIAGNOSIS — N184 Chronic kidney disease, stage 4 (severe): Secondary | ICD-10-CM | POA: Diagnosis not present

## 2023-04-07 DIAGNOSIS — I10 Essential (primary) hypertension: Secondary | ICD-10-CM | POA: Diagnosis not present

## 2023-04-07 DIAGNOSIS — I700 Atherosclerosis of aorta: Secondary | ICD-10-CM | POA: Diagnosis not present

## 2023-04-07 DIAGNOSIS — E782 Mixed hyperlipidemia: Secondary | ICD-10-CM | POA: Diagnosis not present

## 2023-04-15 DIAGNOSIS — E782 Mixed hyperlipidemia: Secondary | ICD-10-CM | POA: Diagnosis not present

## 2023-04-15 DIAGNOSIS — E039 Hypothyroidism, unspecified: Secondary | ICD-10-CM | POA: Diagnosis not present

## 2023-04-15 DIAGNOSIS — M25551 Pain in right hip: Secondary | ICD-10-CM | POA: Diagnosis not present

## 2023-04-15 DIAGNOSIS — N184 Chronic kidney disease, stage 4 (severe): Secondary | ICD-10-CM | POA: Diagnosis not present

## 2023-04-15 DIAGNOSIS — I10 Essential (primary) hypertension: Secondary | ICD-10-CM | POA: Diagnosis not present

## 2023-04-15 DIAGNOSIS — R6889 Other general symptoms and signs: Secondary | ICD-10-CM | POA: Diagnosis not present

## 2023-04-15 DIAGNOSIS — R7309 Other abnormal glucose: Secondary | ICD-10-CM | POA: Diagnosis not present

## 2023-04-15 DIAGNOSIS — G4737 Central sleep apnea in conditions classified elsewhere: Secondary | ICD-10-CM | POA: Diagnosis not present

## 2023-04-15 DIAGNOSIS — G4733 Obstructive sleep apnea (adult) (pediatric): Secondary | ICD-10-CM | POA: Diagnosis not present

## 2023-04-15 DIAGNOSIS — I129 Hypertensive chronic kidney disease with stage 1 through stage 4 chronic kidney disease, or unspecified chronic kidney disease: Secondary | ICD-10-CM | POA: Diagnosis not present

## 2023-04-15 DIAGNOSIS — I700 Atherosclerosis of aorta: Secondary | ICD-10-CM | POA: Diagnosis not present

## 2023-04-15 DIAGNOSIS — D508 Other iron deficiency anemias: Secondary | ICD-10-CM | POA: Diagnosis not present

## 2023-04-15 DIAGNOSIS — R600 Localized edema: Secondary | ICD-10-CM | POA: Diagnosis not present

## 2023-04-22 DIAGNOSIS — M25512 Pain in left shoulder: Secondary | ICD-10-CM | POA: Diagnosis not present

## 2023-04-22 DIAGNOSIS — M2550 Pain in unspecified joint: Secondary | ICD-10-CM | POA: Diagnosis not present

## 2023-04-22 DIAGNOSIS — N184 Chronic kidney disease, stage 4 (severe): Secondary | ICD-10-CM | POA: Diagnosis not present

## 2023-04-22 DIAGNOSIS — M25551 Pain in right hip: Secondary | ICD-10-CM | POA: Diagnosis not present

## 2023-04-22 DIAGNOSIS — M1990 Unspecified osteoarthritis, unspecified site: Secondary | ICD-10-CM | POA: Diagnosis not present

## 2023-04-22 DIAGNOSIS — M545 Low back pain, unspecified: Secondary | ICD-10-CM | POA: Diagnosis not present

## 2023-04-23 DIAGNOSIS — D508 Other iron deficiency anemias: Secondary | ICD-10-CM | POA: Diagnosis not present

## 2023-04-23 DIAGNOSIS — I10 Essential (primary) hypertension: Secondary | ICD-10-CM | POA: Diagnosis not present

## 2023-05-03 DIAGNOSIS — M25551 Pain in right hip: Secondary | ICD-10-CM | POA: Diagnosis not present

## 2023-05-03 DIAGNOSIS — R262 Difficulty in walking, not elsewhere classified: Secondary | ICD-10-CM | POA: Diagnosis not present

## 2023-05-03 DIAGNOSIS — M25651 Stiffness of right hip, not elsewhere classified: Secondary | ICD-10-CM | POA: Diagnosis not present

## 2023-05-03 DIAGNOSIS — R531 Weakness: Secondary | ICD-10-CM | POA: Diagnosis not present

## 2023-05-05 DIAGNOSIS — M25662 Stiffness of left knee, not elsewhere classified: Secondary | ICD-10-CM | POA: Diagnosis not present

## 2023-05-05 DIAGNOSIS — M25551 Pain in right hip: Secondary | ICD-10-CM | POA: Diagnosis not present

## 2023-05-05 DIAGNOSIS — R531 Weakness: Secondary | ICD-10-CM | POA: Diagnosis not present

## 2023-05-05 DIAGNOSIS — R262 Difficulty in walking, not elsewhere classified: Secondary | ICD-10-CM | POA: Diagnosis not present

## 2023-05-11 DIAGNOSIS — M25551 Pain in right hip: Secondary | ICD-10-CM | POA: Diagnosis not present

## 2023-05-11 DIAGNOSIS — M25651 Stiffness of right hip, not elsewhere classified: Secondary | ICD-10-CM | POA: Diagnosis not present

## 2023-05-11 DIAGNOSIS — M25662 Stiffness of left knee, not elsewhere classified: Secondary | ICD-10-CM | POA: Diagnosis not present

## 2023-05-13 DIAGNOSIS — R531 Weakness: Secondary | ICD-10-CM | POA: Diagnosis not present

## 2023-05-13 DIAGNOSIS — R262 Difficulty in walking, not elsewhere classified: Secondary | ICD-10-CM | POA: Diagnosis not present

## 2023-05-13 DIAGNOSIS — M25551 Pain in right hip: Secondary | ICD-10-CM | POA: Diagnosis not present

## 2023-05-13 DIAGNOSIS — M25651 Stiffness of right hip, not elsewhere classified: Secondary | ICD-10-CM | POA: Diagnosis not present

## 2023-05-19 DIAGNOSIS — R531 Weakness: Secondary | ICD-10-CM | POA: Diagnosis not present

## 2023-05-19 DIAGNOSIS — M25651 Stiffness of right hip, not elsewhere classified: Secondary | ICD-10-CM | POA: Diagnosis not present

## 2023-05-19 DIAGNOSIS — M25551 Pain in right hip: Secondary | ICD-10-CM | POA: Diagnosis not present

## 2023-05-19 DIAGNOSIS — R262 Difficulty in walking, not elsewhere classified: Secondary | ICD-10-CM | POA: Diagnosis not present

## 2023-05-21 DIAGNOSIS — M25651 Stiffness of right hip, not elsewhere classified: Secondary | ICD-10-CM | POA: Diagnosis not present

## 2023-05-21 DIAGNOSIS — M25551 Pain in right hip: Secondary | ICD-10-CM | POA: Diagnosis not present

## 2023-05-21 DIAGNOSIS — R262 Difficulty in walking, not elsewhere classified: Secondary | ICD-10-CM | POA: Diagnosis not present

## 2023-05-21 DIAGNOSIS — R531 Weakness: Secondary | ICD-10-CM | POA: Diagnosis not present

## 2023-05-26 DIAGNOSIS — R531 Weakness: Secondary | ICD-10-CM | POA: Diagnosis not present

## 2023-05-26 DIAGNOSIS — Z905 Acquired absence of kidney: Secondary | ICD-10-CM | POA: Diagnosis not present

## 2023-05-26 DIAGNOSIS — R310 Gross hematuria: Secondary | ICD-10-CM | POA: Diagnosis not present

## 2023-05-26 DIAGNOSIS — R262 Difficulty in walking, not elsewhere classified: Secondary | ICD-10-CM | POA: Diagnosis not present

## 2023-05-26 DIAGNOSIS — M25551 Pain in right hip: Secondary | ICD-10-CM | POA: Diagnosis not present

## 2023-05-26 DIAGNOSIS — M25651 Stiffness of right hip, not elsewhere classified: Secondary | ICD-10-CM | POA: Diagnosis not present

## 2023-06-01 DIAGNOSIS — R531 Weakness: Secondary | ICD-10-CM | POA: Diagnosis not present

## 2023-06-01 DIAGNOSIS — M25651 Stiffness of right hip, not elsewhere classified: Secondary | ICD-10-CM | POA: Diagnosis not present

## 2023-06-01 DIAGNOSIS — R262 Difficulty in walking, not elsewhere classified: Secondary | ICD-10-CM | POA: Diagnosis not present

## 2023-06-01 DIAGNOSIS — M25551 Pain in right hip: Secondary | ICD-10-CM | POA: Diagnosis not present

## 2023-06-08 DIAGNOSIS — M25651 Stiffness of right hip, not elsewhere classified: Secondary | ICD-10-CM | POA: Diagnosis not present

## 2023-06-08 DIAGNOSIS — R531 Weakness: Secondary | ICD-10-CM | POA: Diagnosis not present

## 2023-06-08 DIAGNOSIS — M25662 Stiffness of left knee, not elsewhere classified: Secondary | ICD-10-CM | POA: Diagnosis not present

## 2023-06-08 DIAGNOSIS — R262 Difficulty in walking, not elsewhere classified: Secondary | ICD-10-CM | POA: Diagnosis not present

## 2023-06-14 DIAGNOSIS — R262 Difficulty in walking, not elsewhere classified: Secondary | ICD-10-CM | POA: Diagnosis not present

## 2023-06-14 DIAGNOSIS — R531 Weakness: Secondary | ICD-10-CM | POA: Diagnosis not present

## 2023-06-14 DIAGNOSIS — M25551 Pain in right hip: Secondary | ICD-10-CM | POA: Diagnosis not present

## 2023-06-14 DIAGNOSIS — M25651 Stiffness of right hip, not elsewhere classified: Secondary | ICD-10-CM | POA: Diagnosis not present

## 2023-06-15 DIAGNOSIS — M25651 Stiffness of right hip, not elsewhere classified: Secondary | ICD-10-CM | POA: Diagnosis not present

## 2023-06-15 DIAGNOSIS — M25551 Pain in right hip: Secondary | ICD-10-CM | POA: Diagnosis not present

## 2023-06-15 DIAGNOSIS — R531 Weakness: Secondary | ICD-10-CM | POA: Diagnosis not present

## 2023-06-15 DIAGNOSIS — R262 Difficulty in walking, not elsewhere classified: Secondary | ICD-10-CM | POA: Diagnosis not present

## 2023-06-22 DIAGNOSIS — R262 Difficulty in walking, not elsewhere classified: Secondary | ICD-10-CM | POA: Diagnosis not present

## 2023-06-22 DIAGNOSIS — M25551 Pain in right hip: Secondary | ICD-10-CM | POA: Diagnosis not present

## 2023-06-22 DIAGNOSIS — R531 Weakness: Secondary | ICD-10-CM | POA: Diagnosis not present

## 2023-06-22 DIAGNOSIS — M25651 Stiffness of right hip, not elsewhere classified: Secondary | ICD-10-CM | POA: Diagnosis not present

## 2023-06-24 DIAGNOSIS — M25662 Stiffness of left knee, not elsewhere classified: Secondary | ICD-10-CM | POA: Diagnosis not present

## 2023-06-24 DIAGNOSIS — N184 Chronic kidney disease, stage 4 (severe): Secondary | ICD-10-CM | POA: Diagnosis not present

## 2023-06-24 DIAGNOSIS — M25551 Pain in right hip: Secondary | ICD-10-CM | POA: Diagnosis not present

## 2023-06-24 DIAGNOSIS — M25651 Stiffness of right hip, not elsewhere classified: Secondary | ICD-10-CM | POA: Diagnosis not present

## 2023-06-28 ENCOUNTER — Telehealth (HOSPITAL_BASED_OUTPATIENT_CLINIC_OR_DEPARTMENT_OTHER): Payer: Self-pay

## 2023-06-28 ENCOUNTER — Other Ambulatory Visit: Payer: Self-pay | Admitting: Urology

## 2023-06-28 DIAGNOSIS — C678 Malignant neoplasm of overlapping sites of bladder: Secondary | ICD-10-CM | POA: Diagnosis not present

## 2023-06-28 DIAGNOSIS — C662 Malignant neoplasm of left ureter: Secondary | ICD-10-CM | POA: Diagnosis not present

## 2023-06-28 NOTE — Telephone Encounter (Signed)
   Pre-operative Risk Assessment    Patient Name: [name] [name]  DOB: [birth year] MRN: [id number]      Request for Surgical Clearance    Procedure:   Transurethral Resection of Bladder Tumor & Retrograde Pyelogram  Date of Surgery:  Clearance 07/21/23                                 Surgeon:  Dr. Sebastian Ache Surgeon's Group or Practice Name:  [facility] Phone number:  [phone] Fax number:  [phone]   Type of Clearance Requested:   - Medical  - Pharmacy:  Hold Aspirin 5 days prior   Type of Anesthesia:   Not specified on clearance   Additional requests/questions:   None  Lorella Nimrod   06/28/2023, 3:19 PM

## 2023-06-29 DIAGNOSIS — C679 Malignant neoplasm of bladder, unspecified: Secondary | ICD-10-CM | POA: Diagnosis not present

## 2023-06-29 DIAGNOSIS — R319 Hematuria, unspecified: Secondary | ICD-10-CM | POA: Diagnosis not present

## 2023-06-29 DIAGNOSIS — Z905 Acquired absence of kidney: Secondary | ICD-10-CM | POA: Diagnosis not present

## 2023-06-29 DIAGNOSIS — I129 Hypertensive chronic kidney disease with stage 1 through stage 4 chronic kidney disease, or unspecified chronic kidney disease: Secondary | ICD-10-CM | POA: Diagnosis not present

## 2023-06-29 DIAGNOSIS — E875 Hyperkalemia: Secondary | ICD-10-CM | POA: Diagnosis not present

## 2023-06-29 DIAGNOSIS — Z960 Presence of urogenital implants: Secondary | ICD-10-CM | POA: Diagnosis not present

## 2023-06-29 DIAGNOSIS — D509 Iron deficiency anemia, unspecified: Secondary | ICD-10-CM | POA: Diagnosis not present

## 2023-06-29 DIAGNOSIS — N2581 Secondary hyperparathyroidism of renal origin: Secondary | ICD-10-CM | POA: Diagnosis not present

## 2023-06-29 DIAGNOSIS — E872 Acidosis, unspecified: Secondary | ICD-10-CM | POA: Diagnosis not present

## 2023-06-29 DIAGNOSIS — N184 Chronic kidney disease, stage 4 (severe): Secondary | ICD-10-CM | POA: Diagnosis not present

## 2023-06-29 NOTE — Telephone Encounter (Signed)
   Name: [name] [name]  DOB: [birth year]  MRN: [id number]  Primary Cardiologist: Jodelle Red, MD  Chart reviewed as part of pre-operative protocol coverage. Because of [name] [name]'s past medical history and time since last visit, he will require a follow-up in-office visit in order to better assess preoperative cardiovascular risk.  Patient has an office visit scheduled with Dr. Cristal Deer on 07/09/2023. Appointment notes have been updated to reflect need for pre-op evaluation.   Pre-op covering staff:  - Please contact requesting surgeon's office via preferred method (i.e, phone, fax) to inform them of need for appointment prior to surgery.   Carlos Levering, NP  06/29/2023, 11:50 AM

## 2023-06-29 NOTE — Telephone Encounter (Signed)
Pt has an appt with Dr. Cristal Deer. DW updated appt notes. I will forward to Dr. Cristal Deer.

## 2023-07-05 DIAGNOSIS — R262 Difficulty in walking, not elsewhere classified: Secondary | ICD-10-CM | POA: Diagnosis not present

## 2023-07-05 DIAGNOSIS — R531 Weakness: Secondary | ICD-10-CM | POA: Diagnosis not present

## 2023-07-05 DIAGNOSIS — M25662 Stiffness of left knee, not elsewhere classified: Secondary | ICD-10-CM | POA: Diagnosis not present

## 2023-07-05 DIAGNOSIS — M25651 Stiffness of right hip, not elsewhere classified: Secondary | ICD-10-CM | POA: Diagnosis not present

## 2023-07-07 DIAGNOSIS — R262 Difficulty in walking, not elsewhere classified: Secondary | ICD-10-CM | POA: Diagnosis not present

## 2023-07-07 DIAGNOSIS — M25551 Pain in right hip: Secondary | ICD-10-CM | POA: Diagnosis not present

## 2023-07-07 DIAGNOSIS — M25651 Stiffness of right hip, not elsewhere classified: Secondary | ICD-10-CM | POA: Diagnosis not present

## 2023-07-07 DIAGNOSIS — R531 Weakness: Secondary | ICD-10-CM | POA: Diagnosis not present

## 2023-07-09 ENCOUNTER — Ambulatory Visit (HOSPITAL_BASED_OUTPATIENT_CLINIC_OR_DEPARTMENT_OTHER): Payer: Medicare Other | Admitting: Cardiology

## 2023-07-09 ENCOUNTER — Encounter (HOSPITAL_BASED_OUTPATIENT_CLINIC_OR_DEPARTMENT_OTHER): Payer: Self-pay | Admitting: Cardiology

## 2023-07-09 VITALS — BP 122/76 | HR 76 | Ht 68.0 in | Wt 164.8 lb

## 2023-07-09 DIAGNOSIS — I10 Essential (primary) hypertension: Secondary | ICD-10-CM | POA: Diagnosis not present

## 2023-07-09 DIAGNOSIS — Z01810 Encounter for preprocedural cardiovascular examination: Secondary | ICD-10-CM

## 2023-07-09 DIAGNOSIS — E785 Hyperlipidemia, unspecified: Secondary | ICD-10-CM | POA: Diagnosis not present

## 2023-07-09 DIAGNOSIS — C679 Malignant neoplasm of bladder, unspecified: Secondary | ICD-10-CM

## 2023-07-09 DIAGNOSIS — G4730 Sleep apnea, unspecified: Secondary | ICD-10-CM

## 2023-07-09 NOTE — Patient Instructions (Signed)
Medication Instructions:  Continue current medications  *If you need a refill on your cardiac medications before your next appointment, please call your pharmacy*   Lab Work: None Ordered   Testing/Procedures: None Ordered   Follow-Up: At [facility], you and your health needs are our priority.  As part of our continuing mission to provide you with exceptional heart care, we have created designated Provider Care Teams.  These Care Teams include your primary Cardiologist (physician) and Advanced Practice Providers (APPs -  Physician Assistants and Nurse Practitioners) who all work together to provide you with the care you need, when you need it.  We recommend signing up for the patient portal called "MyChart".  Sign up information is provided on this After Visit Summary.  MyChart is used to connect with patients for Virtual Visits (Telemedicine).  Patients are able to view lab/test results, encounter notes, upcoming appointments, etc.  Non-urgent messages can be sent to your provider as well.   To learn more about what you can do with MyChart, go to [email or web address].    Your next appointment:   1 year(s)  Provider:   Bridgette Christopher, MD    Other Instructions   

## 2023-07-09 NOTE — Progress Notes (Signed)
Cardiology Office Note:  .    Date:  07/09/2023  ID:  [name] [name], DOB [birth year], MRN [id number] PCP: [name], MD  [facility] Providers Cardiologist:  Jodelle Red, MD     History of Present Illness: .    [name] [name] is a 89 y.o. male with a hx of sleep apnea on BiPAP, bladder cancer who is seen for follow up today. I initially met him 04/10/20  as a new consult at the request of [name], MD for the evaluation and management of lightheadedness and palpitations.   Prior urgent appt to discuss ER visit 04/11/21. Note reviewed. hsTn 6, 8. VQ scan without evidence on PE. ESR 42, CRP 3.2.  Cr up to 2.84, baseline around 2. Initially he had a slowly growing discomfort in his central chest, had difficulty taking a deep breath. Was constant, continued to worsen over several days. Had neck, jaw, tooth pain as well. Had severe headache, couldn't bend over. Went to ER. Started to feel better in the ER, all symptoms disappeared within 1-2 days of leaving the ER. He stated it felt like when he had asthma when he was very young. No fevers/chills, no cough or mucus.   In 04/2021, he felt back to baseline. He continued to have chronic loss of leg strength, though still able to walk over a mile in his neighborhood every day. About once/day had extreme fatigue, needed to nap briefly. He was using his CPAP. Reviewed prior ETT, nuclear stress test results. After shared decision making, no further testing was pursued.   At his visit 05/2022, he complained of low energy and stamina with shortness of breath. He was on chemotherapy via nephrostomy tube, with weekly sessions. He had also noticed worsening LE edema for a few months. Was wearing compression socks at home.   He underwent left laparoscopic nephroureterectomy 09/02/2022 by Dr. Berneice Heinrich.   Today, he also presents for preoperative clearance for upcoming transurethral resection of bladder tumor scheduled for  07/21/2023. To be performed by Dr. Sebastian Ache.  He denies any recent concerns with his cardiovascular health. He is able to walk indoors, 1-2 blocks on level ground. He states he has difficulty with stairs, limited by his hips and knees. He usually climbs stairs very carefully. He walks 1+ miles every day. He does not believe he would be able to run a short distance. Has good upper body strength. May pick up leaves/sticks from his yard, rakes leaves sometimes. No chest pain or heaviness. No loss of consciousness.  Participating in physical therapy for his hips and knees. Balance has been an ongoing concern; no falls. He complains of some BLE weakness since his nephroureterectomy. Tries not to perform twisting motions with his legs. No leg swelling. He is also following with neurology.  Regarding his diet, he sometimes struggles with knowing what to prepare. Watches his salt and potassium intake. Discussed PREP.  He denies any palpitations, peripheral edema, lightheadedness, headaches, orthopnea, or PND.  ROS:  Please see the history of present illness. ROS otherwise negative except as noted.  (+) Bilateral hip and knee pain (+) BLE weakness  Studies Reviewed: Marland Kitchen    EKG Interpretation Date/Time:  Friday July 09 2023 09:15:55 EDT Ventricular Rate:  76 PR Interval:  162 QRS Duration:  100 QT Interval:  398 QTC Calculation: 447 R Axis:   54  Text Interpretation: Normal sinus rhythm with sinus arrhythmia Normal ECG When compared with ECG of 11-Apr-2021 03:11, No significant change  was found Confirmed by Jodelle Red [phone]) on 07/09/2023 9:30:50 AM    Physical Exam:    VS:  BP 122/76   Pulse 76   Ht 5\' 8"  (1.727 m)   Wt 164 lb 12.8 oz (74.8 kg)   SpO2 100%   BMI 25.06 kg/m    Wt Readings from Last 3 Encounters:  07/09/23 164 lb 12.8 oz (74.8 kg)  09/02/22 169 lb 15.6 oz (77.1 kg)  08/11/22 170 lb 6.7 oz (77.3 kg)    GEN: Well nourished, well developed in no acute  distress HEENT: Normal, moist mucous membranes NECK: No JVD CARDIAC: regular rhythm, normal S1 and S2, no rubs or gallops. No murmur. VASCULAR: Radial and DP pulses 2+ bilaterally. No carotid bruits RESPIRATORY:  Clear to auscultation without rales, wheezing or rhonchi  ABDOMEN: Soft, non-tender, non-distended MUSCULOSKELETAL:  Ambulates independently SKIN: Warm and dry, no edema NEUROLOGIC:  Alert and oriented x 3. No focal neuro deficits noted. PSYCHIATRIC:  Normal affect   ASSESSMENT AND PLAN: .    Preoperative cardiovascular evaluation  According to the Revised Cardiac Risk Index (RCRI), his Perioperative Risk of Major Cardiac Event is (%): 0.9  His Functional Capacity in METs is: 6.61 according to the Duke Activity Status Index (DASI).  The patient is not currently having active cardiac symptoms, and they can achieve >4 METs of activity.  According to ACC/AHA Guidelines, no further testing is needed.  Proceed with surgery at acceptable risk.  Our service is available as needed in the peri-operative period.      Bladder cancer: pending TURP -reviewed red flag signs that are concerning for heart issues   Hyperlipidemia, unspecified: -continue rosuvastatin   Hypertension:  -continue irbesartan -continue amlodipine, may worsen LE edema but counseled on compression/elevation   Sleep apnea: continue treatment   Cardiac risk counseling and prevention recommendations: -recommend heart healthy/Mediterranean diet, with whole grains, fruits, vegetable, fish, lean meats, nuts, and olive oil. Limit salt. -recommend moderate walking, 3-5 times/week for 30-50 minutes each session. Aim for at least 150 minutes.week. Goal should be pace of 3 miles/hours, or walking 1.5 miles in 30 minutes -recommend avoidance of tobacco products. Avoid excess alcohol.  -on aspirin for primary prevention, on hold for procedure. No issues with this. We discussed stopping aspirin given age, indication, but  he would like to continue  Dispo: Follow-up in 1 year, or sooner as needed.  I,Mathew Stumpf,acting as a Neurosurgeon for [employer], MD.,have documented all relevant documentation on the behalf of Jodelle Red, MD,as directed by  Jodelle Red, MD while in the presence of Jodelle Red, MD.  I, Jodelle Red, MD, have reviewed all documentation for this visit. The documentation on 07/09/23 for the exam, diagnosis, procedures, and orders are all accurate and complete.   Signed, Jodelle Red, MD

## 2023-07-12 DIAGNOSIS — M25651 Stiffness of right hip, not elsewhere classified: Secondary | ICD-10-CM | POA: Diagnosis not present

## 2023-07-12 DIAGNOSIS — R531 Weakness: Secondary | ICD-10-CM | POA: Diagnosis not present

## 2023-07-12 DIAGNOSIS — M25551 Pain in right hip: Secondary | ICD-10-CM | POA: Diagnosis not present

## 2023-07-12 DIAGNOSIS — R262 Difficulty in walking, not elsewhere classified: Secondary | ICD-10-CM | POA: Diagnosis not present

## 2023-07-14 DIAGNOSIS — M25551 Pain in right hip: Secondary | ICD-10-CM | POA: Diagnosis not present

## 2023-07-14 DIAGNOSIS — R531 Weakness: Secondary | ICD-10-CM | POA: Diagnosis not present

## 2023-07-14 DIAGNOSIS — R262 Difficulty in walking, not elsewhere classified: Secondary | ICD-10-CM | POA: Diagnosis not present

## 2023-07-14 DIAGNOSIS — M25651 Stiffness of right hip, not elsewhere classified: Secondary | ICD-10-CM | POA: Diagnosis not present

## 2023-07-15 ENCOUNTER — Telehealth: Payer: Self-pay

## 2023-07-15 NOTE — Telephone Encounter (Signed)
Called re: PREP program referral; left voicemail requesting return call

## 2023-07-15 NOTE — Patient Instructions (Addendum)
SURGICAL WAITING ROOM VISITATION Patients having surgery or a procedure may have no more than 2 support people in the waiting area - these visitors may rotate in the visitor waiting room.   Due to an increase in RSV and influenza rates and associated hospitalizations, children ages 16 and under may not visit patients in [facility] hospitals. If the patient needs to stay at the hospital during part of their recovery, the visitor guidelines for inpatient rooms apply.  PRE-OP VISITATION  Pre-op nurse will coordinate an appropriate time for 1 support person to accompany the patient in pre-op.  This support person may not rotate.  This visitor will be contacted when the time is appropriate for the visitor to come back in the pre-op area.  Please refer to the [facility] website for the visitor guidelines for Inpatients (after your surgery is over and you are in a regular room).  You are not required to quarantine at this time prior to your surgery. However, you must do this: Hand Hygiene often Do NOT share personal items Notify your provider if you are in close contact with someone who has COVID or you develop fever 100.4 or greater, new onset of sneezing, cough, sore throat, shortness of breath or body aches.  If you test positive for Covid or have been in contact with anyone that has tested positive in the last 10 days please notify you surgeon.    Your procedure is scheduled on:  Wednesday  July 21, 2023  Report to [facility] Main Entrance: [place] entrance where the [employer] is available.   Report to admitting at:    08:00   AM  Call this number if you have any questions or problems the morning of surgery [phone]  DO NOT EAT OR DRINK ANYTHING AFTER MIDNIGHT THE NIGHT PRIOR TO YOUR SURGERY / PROCEDURE.   FOLLOW BOWEL PREP AND ANY ADDITIONAL PRE OP INSTRUCTIONS YOU RECEIVED FROM YOUR SURGEON'S OFFICE!!!   Oral Hygiene is also important to reduce your risk of  infection.        Remember - BRUSH YOUR TEETH THE MORNING OF SURGERY WITH YOUR REGULAR TOOTHPASTE  Do NOT smoke after Midnight the night before surgery.  STOP TAKING all Vitamins, Herbs and supplements 1 week before your surgery.   Take ONLY these medicines the morning of surgery with A SIP OF WATER: Levothyroxine   If You have been diagnosed with Sleep Apnea - Bring CPAP mask and tubing day of surgery. We will provide you with a CPAP machine on the day of your surgery.                   You may not have any metal on your body including jewelry, and body piercing  Do not wear lotions, powders,  cologne, or deodorant  Men may shave face and neck.  Contacts, Hearing Aids, dentures or bridgework may not be worn into surgery. DENTURES WILL BE REMOVED PRIOR TO SURGERY PLEASE DO NOT APPLY "Poly grip" OR ADHESIVES!!!   Patients discharged on the day of surgery will not be allowed to drive home.  Someone NEEDS to stay with you for the first 24 hours after anesthesia.  Do not bring your home medications to the hospital. The Pharmacy will dispense medications listed on your medication list to you during your admission in the Hospital.  Special Instructions: Bring a copy of your healthcare power of attorney and living will documents the day of surgery, if you wish  to have them scanned into your [facility] Records- EPIC  Please read over the following fact sheets you were given: IF YOU HAVE QUESTIONS ABOUT YOUR PRE-OP INSTRUCTIONS, PLEASE CALL [phone].   [facility] - Preparing for Surgery Before surgery, you can play an important role.  Because skin is not sterile, your skin needs to be as free of germs as possible.  You can reduce the number of germs on your skin by washing with CHG (chlorahexidine gluconate) soap before surgery.  CHG is an antiseptic cleaner which kills germs and bonds with the skin to continue killing germs even after washing. Please DO NOT use if you have an  allergy to CHG or antibacterial soaps.  If your skin becomes reddened/irritated stop using the CHG and inform your nurse when you arrive at Short Stay. Do not shave (including legs and underarms) for at least 48 hours prior to the first CHG shower.  You may shave your face/neck.  Please follow these instructions carefully:  1.  Shower with CHG Soap the night before surgery and the  morning of surgery.  2.  If you choose to wash your hair, wash your hair first as usual with your normal  shampoo.  3.  After you shampoo, rinse your hair and body thoroughly to remove the shampoo.                             4.  Use CHG as you would any other liquid soap.  You can apply chg directly to the skin and wash.  Gently with a scrungie or clean washcloth.  5.  Apply the CHG Soap to your body ONLY FROM THE NECK DOWN.   Do not use on face/ open                           Wound or open sores. Avoid contact with eyes, ears mouth and genitals (private parts).                       Wash face,  Genitals (private parts) with your normal soap.             6.  Wash thoroughly, paying special attention to the area where your  surgery  will be performed.  7.  Thoroughly rinse your body with warm water from the neck down.  8.  DO NOT shower/wash with your normal soap after using and rinsing off the CHG Soap.            9.  Pat yourself dry with a clean towel.            10.  Wear clean pajamas.            11.  Place clean sheets on your bed the night of your first shower and do not  sleep with pets.  ON THE DAY OF SURGERY : Do not apply any lotions/deodorants the morning of surgery.  Please wear clean clothes to the hospital/surgery center.    FAILURE TO FOLLOW THESE INSTRUCTIONS MAY RESULT IN THE CANCELLATION OF YOUR SURGERY  PATIENT SIGNATURE_________________________________  NURSE SIGNATURE__________________________________  ________________________________________________________________________

## 2023-07-15 NOTE — Progress Notes (Addendum)
COVID Vaccine received:  []  No [x]  Yes Date of any COVID positive Test in last 90 days:  None  PCP - [name], MD  Cardiologist - Jodelle Red, MD   cardiac clearance in 07-09-23  note  Chest x-ray - 03-16-2023  2v   Epic EKG -  07-09-2023  Epic Stress Test - 08-22-2020  Epic ECHO - 05-03-2020  Epic Cardiac Cath -   PCR screen: []  Ordered & Completed           []   No Order but Needs PROFEND           [x]   N/A for this surgery  Surgery Plan:  [x]  Ambulatory                            []  Outpatient in bed                            []  Admit  Anesthesia:    [x]  General  []  Spinal                           []   Choice []   MAC  Bowel Prep - [x]  No  []   Yes ______  Pacemaker / ICD device [x]  No []  Yes   Spinal Cord Stimulator:[x]  No []  Yes       History of Sleep Apnea? []  No [x]  Yes   CPAP used?- []  No [x]  Yes    Does the patient monitor blood sugar?          [x]  No []  Yes  []  N/A Last A1c was 6.3 on 09-02-2022  Patient has: []  NO Hx DM   [x]  Pre-DM                 []  DM1  []   DM2  Blood Thinner / Instructions:  none Aspirin Instructions:  ASA 81 mg  Has already stopped   ERAS Protocol Ordered: [x]  No  []  Yes Patient is to be NPO after: Midnight prior  Activity level: Patient is able to climb a flight of stairs without difficulty; [x]  No CP  [x]  No SOB.  Patient can perform ADLs without assistance.   Anesthesia review: OSA- BiPAP, Palps- arrhythmias, HTN, CKD3, anemia, GERD, HOH- no HAs, Pre- DM- no meds.    Patient denies shortness of breath, fever, cough and chest pain at PAT appointment.  Patient verbalized understanding and agreement to the Pre-Surgical Instructions that were given to them at this PAT appointment. Patient was also educated of the need to review these PAT instructions again prior to his surgery.I reviewed the appropriate phone numbers to call if they have any and questions or concerns.

## 2023-07-16 ENCOUNTER — Other Ambulatory Visit: Payer: Self-pay

## 2023-07-16 ENCOUNTER — Encounter (HOSPITAL_COMMUNITY)
Admission: RE | Admit: 2023-07-16 | Discharge: 2023-07-16 | Disposition: A | Discharge status: Home / Self Care | Payer: Medicare Other | Source: Ambulatory Visit | Attending: Urology | Admitting: Urology

## 2023-07-16 ENCOUNTER — Encounter (HOSPITAL_COMMUNITY): Payer: Self-pay

## 2023-07-16 VITALS — BP 132/66 | HR 72 | Temp 97.8°F | Resp 16 | Ht 68.0 in | Wt 164.0 lb

## 2023-07-16 DIAGNOSIS — N1832 Chronic kidney disease, stage 3b: Secondary | ICD-10-CM | POA: Diagnosis not present

## 2023-07-16 DIAGNOSIS — Z01812 Encounter for preprocedural laboratory examination: Secondary | ICD-10-CM | POA: Insufficient documentation

## 2023-07-16 DIAGNOSIS — C679 Malignant neoplasm of bladder, unspecified: Secondary | ICD-10-CM | POA: Insufficient documentation

## 2023-07-16 DIAGNOSIS — I129 Hypertensive chronic kidney disease with stage 1 through stage 4 chronic kidney disease, or unspecified chronic kidney disease: Secondary | ICD-10-CM | POA: Diagnosis not present

## 2023-07-16 DIAGNOSIS — I10 Essential (primary) hypertension: Secondary | ICD-10-CM

## 2023-07-16 DIAGNOSIS — Z01818 Encounter for other preprocedural examination: Secondary | ICD-10-CM | POA: Diagnosis present

## 2023-07-16 HISTORY — DX: Prediabetes: R73.03

## 2023-07-16 LAB — CBC
HCT: 34.3 % — ABNORMAL LOW (ref 39.0–52.0)
Hemoglobin: 11 g/dL — ABNORMAL LOW (ref 13.0–17.0)
MCH: 30.5 pg (ref 26.0–34.0)
MCHC: 32.1 g/dL (ref 30.0–36.0)
MCV: 95 fL (ref 80.0–100.0)
Platelets: 265 10*3/uL (ref 150–400)
RBC: 3.61 MIL/uL — ABNORMAL LOW (ref 4.22–5.81)
RDW: 14.6 % (ref 11.5–15.5)
WBC: 5.7 10*3/uL (ref 4.0–10.5)
nRBC: 0 % (ref 0.0–0.2)

## 2023-07-16 LAB — BASIC METABOLIC PANEL
Anion gap: 10 (ref 5–15)
BUN: 48 mg/dL — ABNORMAL HIGH (ref 8–23)
CO2: 22 mmol/L (ref 22–32)
Calcium: 9.3 mg/dL (ref 8.9–10.3)
Chloride: 106 mmol/L (ref 98–111)
Creatinine, Ser: 3.07 mg/dL — ABNORMAL HIGH (ref 0.61–1.24)
GFR, Estimated: 19 mL/min — ABNORMAL LOW (ref >60)
Glucose, Bld: 102 mg/dL — ABNORMAL HIGH (ref 70–99)
Potassium: 4.4 mmol/L (ref 3.5–5.1)
Sodium: 138 mmol/L (ref 135–145)

## 2023-07-19 DIAGNOSIS — M25651 Stiffness of right hip, not elsewhere classified: Secondary | ICD-10-CM | POA: Diagnosis not present

## 2023-07-19 DIAGNOSIS — R262 Difficulty in walking, not elsewhere classified: Secondary | ICD-10-CM | POA: Diagnosis not present

## 2023-07-19 DIAGNOSIS — M25551 Pain in right hip: Secondary | ICD-10-CM | POA: Diagnosis not present

## 2023-07-19 DIAGNOSIS — R531 Weakness: Secondary | ICD-10-CM | POA: Diagnosis not present

## 2023-07-19 NOTE — Anesthesia Preprocedure Evaluation (Signed)
Anesthesia Evaluation  Patient identified by MRN, date of birth, ID band Patient awake    Reviewed: Allergy & Precautions, NPO status , Patient's Chart, lab work & pertinent test results  Airway Mallampati: II  TM Distance: >3 FB Neck ROM: Full    Dental no notable dental hx.    Pulmonary asthma , sleep apnea and Continuous Positive Airway Pressure Ventilation    Pulmonary exam normal        Cardiovascular hypertension, Pt. on medications + dysrhythmias  Rhythm:Regular Rate:Normal     Neuro/Psych negative neurological ROS  negative psych ROS   GI/Hepatic Neg liver ROS,GERD  Medicated,,  Endo/Other  Hypothyroidism    Renal/GU CRFRenal diseaseRenal pelvis cancer  negative genitourinary   Musculoskeletal  (+) Arthritis , Osteoarthritis,    Abdominal Normal abdominal exam  (+)   Peds  Hematology  (+) Blood dyscrasia, anemia   Anesthesia Other Findings   Reproductive/Obstetrics                             Anesthesia Physical Anesthesia Plan  ASA: 3  Anesthesia Plan: General   Post-op Pain Management:    Induction: Intravenous  PONV Risk Score and Plan: 2 and Ondansetron, Treatment may vary due to age or medical condition and Midazolam  Airway Management Planned: LMA  Additional Equipment: None  Intra-op Plan:   Post-operative Plan: Extubation in OR  Informed Consent: I have reviewed the patients History and Physical, chart, labs and discussed the procedure including the risks, benefits and alternatives for the proposed anesthesia with the patient or authorized representative who has indicated his/her understanding and acceptance.     Dental advisory given  Plan Discussed with: CRNA  Anesthesia Plan Comments: (See PAT note from 8/23 by Sherlie Ban PA-C )        Anesthesia Quick Evaluation

## 2023-07-19 NOTE — Progress Notes (Signed)
Case: [id number] Date/Time: 07/21/23 1000   Procedures:      TRANSURETHRAL RESECTION OF BLADDER TUMOR (TURBT) - 60 MINUTES     CYSTOSCOPY WITH RIGHT RETROGRADE PYELOGRAM (Right)   Anesthesia type: General   Pre-op diagnosis: BLADDER CANCER   Location: [facility]   Surgeons: Loletta Parish., MD       DISCUSSION: [name] [name] is an 89 yo male who presents to PAT prior to surgery above. PMH significant for recurrent bladder cancer, HTN, HLD, hypothyroidism, BPH, GERD, OSA (uses CPAP), CKD stage 4, arthritis, anemia, asthma, pre-diabetes.  Had a recent visit with Cardiology for pre-op clearance on 07/09/23 with Dr. Cristal Deer. Noted to be doing well from cardiac standpoint. Limited by arthritis and extremity weakness. Cleared for surgery and advised to f/u in 1 year:   "According to the Revised Cardiac Risk Index (RCRI), his Perioperative Risk of Major Cardiac Event is (%): 0.9. His Functional Capacity in METs is: 6.61 according to the Duke Activity Status Index (DASI). The patient is not currently having active cardiac symptoms, and they can achieve >4 METs of activity. According to ACC/AHA Guidelines, no further testing is needed.  Proceed with surgery at acceptable risk.  Our service is available as needed in the peri-operative period."  VS: BP 132/66 Comment: right arm sitting  Pulse 72   Temp 36.6 C   Resp 16   Ht 5\' 8"  (1.727 m)   Wt 74.4 kg   SpO2 100%   BMI 24.94 kg/m   PROVIDERS: [name], MD Cardiologist: Jodelle Red, MD   LABS: Labs reviewed: Acceptable for surgery. CKD stable, follows with nephrology and PCP (all labs ordered are listed, but only abnormal results are displayed)  Labs Reviewed  BASIC METABOLIC PANEL - Abnormal; Notable for the following components:      Result Value   Glucose, Bld 102 (*)    BUN 48 (*)    Creatinine, Ser 3.07 (*)    GFR, Estimated 19 (*)    All other components within normal limits   CBC - Abnormal; Notable for the following components:   RBC 3.61 (*)    Hemoglobin 11.0 (*)    HCT 34.3 (*)    All other components within normal limits     IMAGES:  CXR 03/16/23:  FINDINGS: Stable cardiac and mediastinal contours. Aortic atherosclerosis. No large area pulmonary consolidation. No pleural effusion or pneumothorax. Midthoracic spine degenerative changes.   IMPRESSION: No active cardiopulmonary disease.   EKG: 07/09/23  NSR, rate 76   CV:  Stress test 08/22/2020:  IMPRESSION: 1. No reversible ischemia or infarction.   2. Normal left ventricular wall motion.   3. Left ventricular ejection fraction 61%   4. Non invasive risk stratification*: Low  Echo 05/03/2020:  IMPRESSIONS     1. Left ventricular ejection fraction, by estimation, is 55 to 60%. The  left ventricle has normal function. The left ventricle has no regional  wall motion abnormalities. There is mild concentric left ventricular  hypertrophy. Left ventricular diastolic  parameters were normal.   2. Right ventricular systolic function is normal. The right ventricular  size is normal.   3. Left atrial size was mildly dilated.   4. The mitral valve is normal in structure. Trivial mitral valve  regurgitation. No evidence of mitral stenosis.   5. The aortic valve is tricuspid. Aortic valve regurgitation is mild.  Mild aortic valve sclerosis is present, with no evidence of aortic valve  stenosis.  6. There is mild dilatation of the ascending aorta measuring 39 mm.   7. The inferior vena cava is normal in size with greater than 50%  respiratory variability, suggesting right atrial pressure of 3 mmHg.   Comparison(s): No prior Echocardiogram.   Past Medical History:  Diagnosis Date   Anemia    Arthritis    lower back bulging disc, hips, knees, thumbs, shoulder   Asthma    as a pre teen   Bladder cancer (HCC) UROLOGIST-  DR MCKENZIE   S/P TURBT 08-16-2017   BPH (benign prostatic  hyperplasia)    CKD (chronic kidney disease), stage III (HCC)    STAGE 3  B CKD per lov 10-16-2020 dr Thyra Breed Everlene Balls kidney on chart   Dyspnea    Fatigue    MIDDLE OF DAY ON OCCASION   Frequency of urination    GERD (gastroesophageal reflux disease)    on protonix   Hematuria    Hematuria    History of colon polyps    HLD (hyperlipidemia)    HOH (hard of hearing)    slightly hoh right worse thanleft   HTN (hypertension)    Hypothyroidism    Incontinence of urine    weras pads all the time   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   Right Buccal Cheek   Pre-diabetes    Recurrent bladder papillary carcinoma (HCC) hx 2012 and 2014-- urologist- dr Ronne Binning   s/p  TURBT 08-16-2017  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   Sleep apnea    uses cpap set on 20 /4   Trigger finger of left hand 07/04/2021   last few months per pt   UTI (urinary tract infection) 08/20/2020   Weakness of extremity    left knee   Wears glasses     Past Surgical History:  Procedure Laterality Date   BCG X 2     AFTER LAST 2 BLADDER TUMOR REMOVALS   CATARACT EXTRACTION, BILATERAL     COLONSCOPY  YRS AGO   CYSTOSCOPY N/A 10/29/2020   Procedure: CYSTOSCOPY;  Surgeon: Noel Christmas, MD;  Location: [facility] ;  Service: Urology;  Laterality: N/A;   CYSTOSCOPY N/A 07/08/2021   Procedure: CYSTOSCOPY;  Surgeon: Noel Christmas, MD;  Location: [facility];  Service: Urology;  Laterality: N/A;   CYSTOSCOPY W/ URETERAL STENT PLACEMENT Bilateral 08/16/2017   Procedure: CYSTOSCOPY WITH RETROGRADE PYELOGRAM/URETERAL STENT PLACEMENT;  Surgeon: Malen Gauze, MD;  Location: [facility];  Service: Urology;  Laterality: Bilateral;   CYSTOSCOPY W/ URETERAL STENT PLACEMENT Left 09/02/2022   Procedure: CYSTOSCOPY WITH RETROGRADE PYELOGRAM, URETERAL STENT PLACEMENT;  Surgeon: Sebastian Ache, MD;  [facility];  Service:  Urology;  Laterality: Left;   CYSTOSCOPY WITH BIOPSY N/A 06/25/2020   Procedure: CYSTOSCOPY FULGURATION   BLADDER  BIOPSY;  Surgeon: Noel Christmas, MD;  Location: [facility];  Service: Urology;  Laterality: N/A;   CYSTOSCOPY WITH FULGERATION N/A 12/08/2019   Procedure: CYSTOSCOPY WITH FULGERATION;  Surgeon: Malen Gauze, MD;  Location: [facility];  Service: Urology;  Laterality: N/A;   CYSTOSCOPY WITH FULGERATION N/A 03/24/2022   Procedure: CYSTOSCOPY WITH FULGERATION;  Surgeon: Noel Christmas, MD;  [facility];  Service: Urology;  Laterality: N/A;   CYSTOSCOPY WITH RETROGRADE PYELOGRAM, URETEROSCOPY AND STENT PLACEMENT Left 03/24/2022   Procedure: CYSTOSCOPY WITH RETROGRADE PYELOGRAM, DIAGNOSTIC URETEROSCOPY WITH BIOPSY AND STENT PLACEMENT;  Surgeon: Kasandra Knudsen  D, MD;  [facility];  Service: Urology;  Laterality: Left;  90 MINUTES   CYSTOSCOPY WITH RETROGRADE PYELOGRAM, URETEROSCOPY AND STENT PLACEMENT Left 04/07/2022   Procedure: CYSTOSCOPY WITH  RETROGRADE PYELOGRAM, DIAGNOSTIC URETEROSCOPY WITH BIOPSY AND STENT EXCHANGE;  Surgeon: Noel Christmas, MD;  [facility];  Service: Urology;  Laterality: Left;  75 MINUTES   CYSTOSCOPY WITH RETROGRADE PYELOGRAM, URETEROSCOPY AND STENT PLACEMENT Left 08/11/2022   Procedure: CYSTOSCOPY WITH RETROGRADE PYELOGRAM, ANTEGRADE NEPHROSTOGRAM, DIAGNOSTIC  URETEROSCOPY;  Surgeon: Noel Christmas, MD;  [facility];  Service: Urology;  Laterality: Left;  90 MINS   HOLMIUM LASER APPLICATION Left 08/11/2022   Procedure: HOLMIUM LASER APPLICATION;  Surgeon: Noel Christmas, MD;  [facility];  Service: Urology;  Laterality: Left;   IR NEPHROSTOMY EXCHANGE LEFT  05/15/2022   IR NEPHROSTOMY EXCHANGE LEFT  07/10/2022   IR NEPHROSTOMY PLACEMENT LEFT  05/06/2022   KNEE ARTHROSCOPY Left 06/10/2011   LYMPH NODE DISSECTION Left 09/02/2022   Procedure: RETROPERITONEAL LYMPH NODE DISSECTION;  Surgeon:  Sebastian Ache, MD;  [facility];  Service: Urology;  Laterality: Left;   PROSTATE BIOPSY  2000   ROBOT ASSITED LAPAROSCOPIC NEPHROURETERECTOMY Left 09/02/2022   Procedure: XI ROBOT ASSITED LAPAROSCOPIC NEPHROURETERECTOMY;  Surgeon: Sebastian Ache, MD;  [facility];  Service: Urology;  Laterality: Left;   TRANSURETHRAL RESECTION OF BLADDER TUMOR N/A 08/16/2017   Procedure: TRANSURETHRAL RESECTION OF BLADDER TUMOR (TURBT);  Surgeon: Malen Gauze, MD;  Location: [facility];  Service: Urology;  Laterality: N/A;   TRANSURETHRAL RESECTION OF BLADDER TUMOR  12-09-2012   dr Marcelyn Bruins [facility]   and TURP   TRANSURETHRAL RESECTION OF BLADDER TUMOR N/A 09/20/2017   Procedure: TRANSURETHRAL RESECTION OF BLADDER TUMOR (TURBT);  Surgeon: Malen Gauze, MD;  Location: [facility];  Service: Urology;  Laterality: N/A;   TRANSURETHRAL RESECTION OF BLADDER TUMOR N/A 12/08/2019   Procedure: TRANSURETHRAL RESECTION OF BLADDER TUMOR;  Surgeon: Malen Gauze, MD;  Location: [facility];  Service: Urology;  Laterality: N/A;  1 HR   TRANSURETHRAL RESECTION OF BLADDER TUMOR N/A 10/29/2020   Procedure: BLADDER FULGERATION;  Surgeon: Noel Christmas, MD;  Location: [facility];  Service: Urology;  Laterality: N/A;  30 MINS   TRANSURETHRAL RESECTION OF BLADDER TUMOR N/A 07/08/2021   Procedure: TRANSURETHRAL RESECTION OF BLADDER TUMOR (TURBT)/FULGERATION;  Surgeon: Noel Christmas, MD;  Location: [facility];  Service: Urology;  Laterality: N/A;  45 MINS   TRANSURETHRAL RESECTION OF BLADDER TUMOR N/A 12/02/2021   Procedure: TRANSURETHRAL RESECTION OF BLADDER TUMOR (TURBT);  Surgeon: Noel Christmas, MD;  [facility];  Service: Urology;  Laterality: N/A;   TRANSURETHRAL RESECTION OF PROSTATE  2011   UPPER GI ENDOSCOPY  YRS AGO    MEDICATIONS:  acetaminophen (TYLENOL) 500 MG tablet   amLODipine (NORVASC) 10  MG tablet   aspirin EC 81 MG tablet   ferrous sulfate 325 (65 FE) MG tablet   finasteride (PROSCAR) 5 MG tablet   levothyroxine (SYNTHROID, LEVOTHROID) 50 MCG tablet   mirabegron ER (MYRBETRIQ) 50 MG TB24 tablet   NON FORMULARY   oxyCODONE (ROXICODONE) 5 MG immediate release tablet   pantoprazole (PROTONIX) 20 MG tablet   psyllium (METAMUCIL) 58.6 % powder   rosuvastatin (CRESTOR) 5 MG tablet   senna-docusate (SENOKOT-S) 8.6-50 MG tablet   silodosin (RAPAFLO) 8 MG CAPS capsule   No current facility-administered medications for this encounter.  Marcille Blanco [facility] Short Stay/Anesthesiology [facility] Phone [phone] 07/19/2023 9:46 AM

## 2023-07-20 DIAGNOSIS — R531 Weakness: Secondary | ICD-10-CM | POA: Diagnosis not present

## 2023-07-20 DIAGNOSIS — M25651 Stiffness of right hip, not elsewhere classified: Secondary | ICD-10-CM | POA: Diagnosis not present

## 2023-07-20 DIAGNOSIS — R262 Difficulty in walking, not elsewhere classified: Secondary | ICD-10-CM | POA: Diagnosis not present

## 2023-07-20 DIAGNOSIS — M25551 Pain in right hip: Secondary | ICD-10-CM | POA: Diagnosis not present

## 2023-07-21 ENCOUNTER — Encounter (HOSPITAL_COMMUNITY)
Admission: RE | Disposition: A | Discharge status: Home / Self Care | Payer: Self-pay | Source: Ambulatory Visit | Attending: Urology

## 2023-07-21 ENCOUNTER — Ambulatory Visit (HOSPITAL_COMMUNITY): Payer: Medicare Other

## 2023-07-21 ENCOUNTER — Ambulatory Visit (HOSPITAL_COMMUNITY): Payer: Medicare Other | Admitting: Medical

## 2023-07-21 ENCOUNTER — Ambulatory Visit (HOSPITAL_COMMUNITY)
Admission: RE | Admit: 2023-07-21 | Discharge: 2023-07-21 | Disposition: A | Discharge status: Home / Self Care | Payer: Medicare Other | Source: Ambulatory Visit | Attending: Urology | Admitting: Urology

## 2023-07-21 ENCOUNTER — Encounter (HOSPITAL_COMMUNITY): Payer: Self-pay | Admitting: Urology

## 2023-07-21 ENCOUNTER — Ambulatory Visit (HOSPITAL_BASED_OUTPATIENT_CLINIC_OR_DEPARTMENT_OTHER): Payer: Medicare Other | Admitting: Anesthesiology

## 2023-07-21 ENCOUNTER — Other Ambulatory Visit: Payer: Self-pay

## 2023-07-21 DIAGNOSIS — N183 Chronic kidney disease, stage 3 unspecified: Secondary | ICD-10-CM | POA: Diagnosis not present

## 2023-07-21 DIAGNOSIS — K219 Gastro-esophageal reflux disease without esophagitis: Secondary | ICD-10-CM | POA: Insufficient documentation

## 2023-07-21 DIAGNOSIS — E039 Hypothyroidism, unspecified: Secondary | ICD-10-CM | POA: Insufficient documentation

## 2023-07-21 DIAGNOSIS — Z85828 Personal history of other malignant neoplasm of skin: Secondary | ICD-10-CM | POA: Insufficient documentation

## 2023-07-21 DIAGNOSIS — J45909 Unspecified asthma, uncomplicated: Secondary | ICD-10-CM | POA: Diagnosis not present

## 2023-07-21 DIAGNOSIS — I129 Hypertensive chronic kidney disease with stage 1 through stage 4 chronic kidney disease, or unspecified chronic kidney disease: Secondary | ICD-10-CM | POA: Insufficient documentation

## 2023-07-21 DIAGNOSIS — G4730 Sleep apnea, unspecified: Secondary | ICD-10-CM | POA: Diagnosis not present

## 2023-07-21 DIAGNOSIS — C679 Malignant neoplasm of bladder, unspecified: Secondary | ICD-10-CM | POA: Diagnosis not present

## 2023-07-21 DIAGNOSIS — D494 Neoplasm of unspecified behavior of bladder: Secondary | ICD-10-CM

## 2023-07-21 DIAGNOSIS — N400 Enlarged prostate without lower urinary tract symptoms: Secondary | ICD-10-CM | POA: Diagnosis not present

## 2023-07-21 DIAGNOSIS — N184 Chronic kidney disease, stage 4 (severe): Secondary | ICD-10-CM | POA: Diagnosis not present

## 2023-07-21 DIAGNOSIS — G4733 Obstructive sleep apnea (adult) (pediatric): Secondary | ICD-10-CM | POA: Diagnosis not present

## 2023-07-21 DIAGNOSIS — Z87440 Personal history of urinary (tract) infections: Secondary | ICD-10-CM | POA: Diagnosis not present

## 2023-07-21 DIAGNOSIS — C675 Malignant neoplasm of bladder neck: Secondary | ICD-10-CM | POA: Diagnosis not present

## 2023-07-21 HISTORY — PX: TRANSURETHRAL RESECTION OF BLADDER TUMOR: SHX2575

## 2023-07-21 HISTORY — PX: CYSTOSCOPY W/ RETROGRADES: SHX1426

## 2023-07-21 SURGERY — TURBT (TRANSURETHRAL RESECTION OF BLADDER TUMOR)
Anesthesia: General | Laterality: Right

## 2023-07-21 MED ORDER — OXYCODONE HCL 5 MG PO TABS: 5.0000 mg | ORAL_TABLET | Freq: Once | ORAL | Status: DC | PRN

## 2023-07-21 MED ORDER — HYDROMORPHONE HCL 1 MG/ML IJ SOLN
INTRAMUSCULAR | Status: AC
  Filled 2023-07-21: qty 1

## 2023-07-21 MED ORDER — ONDANSETRON HCL 4 MG/2ML IJ SOLN
INTRAMUSCULAR | Status: DC | PRN
  Administered 2023-07-21: 4 mg via INTRAVENOUS

## 2023-07-21 MED ORDER — HYDROMORPHONE HCL 1 MG/ML IJ SOLN
0.2500 mg | INTRAMUSCULAR | Status: DC | PRN
  Administered 2023-07-21: 0.25 mg via INTRAVENOUS
  Administered 2023-07-21: 0.5 mg via INTRAVENOUS

## 2023-07-21 MED ORDER — FENTANYL CITRATE (PF) 100 MCG/2ML IJ SOLN
INTRAMUSCULAR | Status: AC
  Filled 2023-07-21: qty 2

## 2023-07-21 MED ORDER — LIDOCAINE 2% (20 MG/ML) 5 ML SYRINGE
INTRAMUSCULAR | Status: DC | PRN
  Administered 2023-07-21: 60 mg via INTRAVENOUS

## 2023-07-21 MED ORDER — DEXAMETHASONE SODIUM PHOSPHATE 10 MG/ML IJ SOLN
INTRAMUSCULAR | Status: DC | PRN
  Administered 2023-07-21: 10 mg via INTRAVENOUS

## 2023-07-21 MED ORDER — FENTANYL CITRATE (PF) 100 MCG/2ML IJ SOLN
INTRAMUSCULAR | Status: DC | PRN
  Administered 2023-07-21: 25 ug via INTRAVENOUS

## 2023-07-21 MED ORDER — DEXAMETHASONE SODIUM PHOSPHATE 10 MG/ML IJ SOLN
INTRAMUSCULAR | Status: AC
  Filled 2023-07-21: qty 1

## 2023-07-21 MED ORDER — SODIUM CHLORIDE 0.9 % IV SOLN
2.0000 g | INTRAVENOUS | Status: AC
  Administered 2023-07-21: 2 g via INTRAVENOUS
  Filled 2023-07-21: qty 20

## 2023-07-21 MED ORDER — PROPOFOL 10 MG/ML IV BOLUS
INTRAVENOUS | Status: DC | PRN
Start: 2023-07-21 — End: 2023-07-21
  Administered 2023-07-21: 150 mg via INTRAVENOUS

## 2023-07-21 MED ORDER — LIDOCAINE HCL (PF) 2 % IJ SOLN
INTRAMUSCULAR | Status: AC
  Filled 2023-07-21: qty 5

## 2023-07-21 MED ORDER — PROMETHAZINE HCL 25 MG/ML IJ SOLN: 6.2500 mg | INTRAMUSCULAR | Status: DC | PRN

## 2023-07-21 MED ORDER — CHLORHEXIDINE GLUCONATE 0.12 % MT SOLN
15.0000 mL | Freq: Once | OROMUCOSAL | Status: AC
  Administered 2023-07-21: 15 mL via OROMUCOSAL

## 2023-07-21 MED ORDER — OXYCODONE HCL 5 MG PO TABS: 5.0000 mg | ORAL_TABLET | Freq: Four times a day (QID) | ORAL | 0 refills | Status: DC | PRN

## 2023-07-21 MED ORDER — SODIUM CHLORIDE 0.9 % IR SOLN
Status: DC | PRN
  Administered 2023-07-21: 6000 mL via INTRAVESICAL

## 2023-07-21 MED ORDER — EPHEDRINE 5 MG/ML INJ
INTRAVENOUS | Status: AC
  Filled 2023-07-21: qty 5

## 2023-07-21 MED ORDER — ORAL CARE MOUTH RINSE: 15.0000 mL | Freq: Once | OROMUCOSAL | Status: AC

## 2023-07-21 MED ORDER — PROPOFOL 10 MG/ML IV BOLUS
INTRAVENOUS | Status: AC
  Filled 2023-07-21: qty 20

## 2023-07-21 MED ORDER — AMISULPRIDE (ANTIEMETIC) 5 MG/2ML IV SOLN: 10.0000 mg | Freq: Once | INTRAVENOUS | Status: DC | PRN

## 2023-07-21 MED ORDER — IOHEXOL 300 MG/ML  SOLN
INTRAMUSCULAR | Status: DC | PRN
  Administered 2023-07-21: 8 mL via URETHRAL

## 2023-07-21 MED ORDER — LACTATED RINGERS IV SOLN: INTRAVENOUS | Status: DC

## 2023-07-21 MED ORDER — OXYCODONE HCL 5 MG/5ML PO SOLN: 5.0000 mg | Freq: Once | ORAL | Status: DC | PRN

## 2023-07-21 MED ORDER — EPHEDRINE SULFATE-NACL 50-0.9 MG/10ML-% IV SOSY
PREFILLED_SYRINGE | INTRAVENOUS | Status: DC | PRN
Start: 2023-07-21 — End: 2023-07-21
  Administered 2023-07-21: 5 mg via INTRAVENOUS

## 2023-07-21 MED ORDER — ONDANSETRON HCL 4 MG/2ML IJ SOLN
INTRAMUSCULAR | Status: AC
  Filled 2023-07-21: qty 2

## 2023-07-21 SURGICAL SUPPLY — 23 items
BAG COUNTER SPONGE SURGICOUNT (BAG) IMPLANT
BAG DRN RND TRDRP ANRFLXCHMBR (UROLOGICAL SUPPLIES)
BAG SPNG CNTER NS LX DISP (BAG)
BAG URINE DRAIN 2000ML AR STRL (UROLOGICAL SUPPLIES) IMPLANT
BAG URO CATCHER STRL LF (MISCELLANEOUS) ×3 IMPLANT
CATH URETL OPEN END 6FR 70 (CATHETERS) IMPLANT
CLOTH BEACON ORANGE TIMEOUT ST (SAFETY) ×3 IMPLANT
DRAPE FOOT SWITCH (DRAPES) ×3 IMPLANT
ELECT REM PT RETURN 15FT ADLT (MISCELLANEOUS) ×3 IMPLANT
EVACUATOR MICROVAS BLADDER (UROLOGICAL SUPPLIES) IMPLANT
GLOVE SURG LX STRL 7.5 STRW (GLOVE) ×3 IMPLANT
GOWN STRL REUS W/ TWL XL LVL3 (GOWN DISPOSABLE) ×3 IMPLANT
GOWN STRL REUS W/TWL XL LVL3 (GOWN DISPOSABLE) ×6
GUIDEWIRE STR DUAL SENSOR (WIRE) ×3 IMPLANT
KIT TURNOVER KIT A (KITS) IMPLANT
LOOP CUT BIPOLAR 24F LRG (ELECTROSURGICAL) IMPLANT
MANIFOLD NEPTUNE II (INSTRUMENTS) ×3 IMPLANT
NS IRRIG 1000ML POUR BTL (IV SOLUTION) IMPLANT
PACK CYSTO (CUSTOM PROCEDURE TRAY) ×3 IMPLANT
SYR TOOMEY IRRIG 70ML (MISCELLANEOUS)
SYRINGE TOOMEY IRRIG 70ML (MISCELLANEOUS) IMPLANT
TUBING CONNECTING 10 (TUBING) ×3 IMPLANT
TUBING UROLOGY SET (TUBING) ×3 IMPLANT

## 2023-07-21 NOTE — Brief Op Note (Signed)
07/21/2023  10:14 AM  PATIENT:  [name] [name]  89 y.o. male  PRE-OPERATIVE DIAGNOSIS:  BLADDER CANCER  POST-OPERATIVE DIAGNOSIS:  bladder tumor  PROCEDURE:  Procedure(s) with comments: TRANSURETHRAL RESECTION OF BLADDER TUMOR (TURBT) (N/A) - 60 MINUTES CYSTOSCOPY WITH RIGHT RETROGRADE PYELOGRAM (Right)  SURGEON:  Surgeons and Role:    * Leiah Giannotti, Delbert Phenix., MD - Primary  PHYSICIAN ASSISTANT:   ASSISTANTS: none   ANESTHESIA:   general  EBL:  minimal   BLOOD ADMINISTERED:none  DRAINS: none   LOCAL MEDICATIONS USED:  NONE  SPECIMEN:  Source of Specimen:  bladder tumor; bladder neck; base of bladder tumor  DISPOSITION OF SPECIMEN:  PATHOLOGY  COUNTS:  YES  TOURNIQUET:  * No tourniquets in log *  DICTATION: .Other Dictation: Dictation Number [id number]  PLAN OF CARE: Discharge to home after PACU  PATIENT DISPOSITION:  PACU - hemodynamically stable.   Delay start of Pharmacological VTE agent (>24hrs) due to surgical blood loss or risk of bleeding: yes

## 2023-07-21 NOTE — H&P (Signed)
[name] [name] is an 89 y.o. male.    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 very early bladder dome and neck recurene, CT normal, Cr 2.6 (stable)  06/2023 - cysto multifocal (dome, bladder neck, prostate fossa) recurrence.   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 Follows Dr. Creta Levin nephrology.   3 - Lower Urinary Tract Symptoms - on finasteride + Myrbetriq at baseline, prior channel TURP   PMH sig for knee surgery, does follow with [facility] but normal stress tests, no blood thinners. 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 Widowed, lives independanly in [place], kids live very close. His PCP is [name] MD   Today "[name] [name]" is seen to proceed with TURBT for multifocal recurrent bladder cancer. Hgb 11, Most recetn UA without infectious parameters.    Past Medical History:  Diagnosis Date   Anemia    Arthritis    lower back bulging disc, hips, knees, thumbs, shoulder   Asthma    as a pre teen   Bladder cancer (HCC) UROLOGIST-  DR MCKENZIE   S/P TURBT 08-16-2017   BPH (benign prostatic hyperplasia)    CKD (chronic kidney disease), stage III (HCC)    STAGE 3  B CKD per lov 10-16-2020 dr Thyra Breed Everlene Balls kidney on chart   Dyspnea    Fatigue    MIDDLE OF DAY ON OCCASION   Frequency of urination    GERD (gastroesophageal reflux disease)    on protonix   Hematuria    Hematuria    History of colon polyps    HLD (hyperlipidemia)    HOH (hard of hearing)    slightly hoh right worse thanleft    HTN (hypertension)    Hypothyroidism    Incontinence of urine    weras pads all the time   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   Right Buccal Cheek   Pre-diabetes    Recurrent bladder papillary carcinoma (HCC) hx 2012 and 2014-- urologist- dr Ronne Binning   s/p  TURBT 08-16-2017  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   Sleep apnea    uses cpap set on 20 /4   Trigger finger of left hand 07/04/2021   last few months per pt   UTI (urinary tract infection) 08/20/2020   Weakness of extremity    left knee   Wears glasses     Past Surgical History:  Procedure Laterality Date   BCG X 2     AFTER LAST 2 BLADDER TUMOR REMOVALS   CATARACT EXTRACTION, BILATERAL     COLONSCOPY  YRS AGO   CYSTOSCOPY N/A 10/29/2020   Procedure: CYSTOSCOPY;  Surgeon: Noel Christmas, MD;  Location: [facility];  Service: Urology;  Laterality: N/A;   CYSTOSCOPY N/A 07/08/2021   Procedure: CYSTOSCOPY;  Surgeon: Noel Christmas, MD;  Location: [facility];  Service: Urology;  Laterality: N/A;   CYSTOSCOPY W/ URETERAL STENT PLACEMENT Bilateral 08/16/2017   Procedure: CYSTOSCOPY WITH RETROGRADE PYELOGRAM/URETERAL STENT PLACEMENT;  Surgeon: Malen Gauze, MD;  Location: [facility];  Service: Urology;  Laterality: Bilateral;   CYSTOSCOPY W/ URETERAL STENT PLACEMENT Left 09/02/2022   Procedure: CYSTOSCOPY WITH RETROGRADE PYELOGRAM, URETERAL STENT PLACEMENT;  Surgeon: Sebastian Ache, MD;  Location: [facility];  Service: Urology;  Laterality: Left;   CYSTOSCOPY WITH BIOPSY N/A 06/25/2020   Procedure: CYSTOSCOPY FULGURATION   BLADDER  BIOPSY;  Surgeon: Noel Christmas, MD;  Location: [facility];  Service: Urology;  Laterality: N/A;   CYSTOSCOPY WITH FULGERATION N/A 12/08/2019   Procedure: CYSTOSCOPY WITH FULGERATION;  Surgeon: Malen Gauze, MD;  Location: [facility];  Service: Urology;  Laterality:  N/A;   CYSTOSCOPY WITH FULGERATION N/A 03/24/2022   Procedure: CYSTOSCOPY WITH FULGERATION;  Surgeon: Noel Christmas, MD;  Location: [facility];  Service: Urology;  Laterality: N/A;   CYSTOSCOPY WITH RETROGRADE PYELOGRAM, URETEROSCOPY AND STENT PLACEMENT Left 03/24/2022   Procedure: CYSTOSCOPY WITH RETROGRADE PYELOGRAM, DIAGNOSTIC URETEROSCOPY WITH BIOPSY AND STENT PLACEMENT;  Surgeon: Noel Christmas, MD;  Location: [facility];  Service: Urology;  Laterality: Left;  90 MINUTES   CYSTOSCOPY WITH RETROGRADE PYELOGRAM, URETEROSCOPY AND STENT PLACEMENT Left 04/07/2022   Procedure: CYSTOSCOPY WITH  RETROGRADE PYELOGRAM, DIAGNOSTIC URETEROSCOPY WITH BIOPSY AND STENT EXCHANGE;  Surgeon: Noel Christmas, MD;  Location: [facility];  Service: Urology;  Laterality: Left;  75 MINUTES   CYSTOSCOPY WITH RETROGRADE PYELOGRAM, URETEROSCOPY AND STENT PLACEMENT Left 08/11/2022   Procedure: CYSTOSCOPY WITH RETROGRADE PYELOGRAM, ANTEGRADE NEPHROSTOGRAM, DIAGNOSTIC  URETEROSCOPY;  Surgeon: Noel Christmas, MD;  Location: [facility];  Service: Urology;  Laterality: Left;  90 MINS   HOLMIUM LASER APPLICATION Left 08/11/2022   Procedure: HOLMIUM LASER APPLICATION;  Surgeon: Noel Christmas, MD;  Location: [facility];  Service: Urology;  Laterality: Left;   IR NEPHROSTOMY EXCHANGE LEFT  05/15/2022   IR NEPHROSTOMY EXCHANGE LEFT  07/10/2022   IR NEPHROSTOMY PLACEMENT LEFT  05/06/2022   KNEE ARTHROSCOPY Left 06/10/2011   LYMPH NODE DISSECTION Left 09/02/2022   Procedure: RETROPERITONEAL LYMPH NODE DISSECTION;  Surgeon: Sebastian Ache, MD;  Location: [facility];  Service: Urology;  Laterality: Left;   PROSTATE BIOPSY  2000   ROBOT ASSITED LAPAROSCOPIC NEPHROURETERECTOMY Left 09/02/2022   Procedure: XI ROBOT ASSITED LAPAROSCOPIC NEPHROURETERECTOMY;  Surgeon: Sebastian Ache, MD;  Location: [facility];  Service: Urology;  Laterality: Left;   TRANSURETHRAL RESECTION OF BLADDER TUMOR N/A 08/16/2017   Procedure: TRANSURETHRAL RESECTION OF BLADDER TUMOR  (TURBT);  Surgeon: Malen Gauze, MD;  Location: [facility];  Service: Urology;  Laterality: N/A;   TRANSURETHRAL RESECTION OF BLADDER TUMOR  12-09-2012   dr Marcelyn Bruins [facility]   and TURP   TRANSURETHRAL RESECTION OF BLADDER TUMOR N/A 09/20/2017   Procedure: TRANSURETHRAL RESECTION OF BLADDER TUMOR (TURBT);  Surgeon: Malen Gauze, MD;  Location: [facility];  Service: Urology;  Laterality: N/A;   TRANSURETHRAL RESECTION OF BLADDER TUMOR N/A 12/08/2019   Procedure: TRANSURETHRAL RESECTION OF BLADDER TUMOR;  Surgeon: Malen Gauze, MD;  Location: [facility];  Service: Urology;  Laterality: N/A;  1 HR   TRANSURETHRAL RESECTION OF BLADDER TUMOR N/A 10/29/2020   Procedure: BLADDER FULGERATION;  Surgeon: Noel Christmas, MD;  Location: [facility];  Service: Urology;  Laterality: N/A;  30 MINS   TRANSURETHRAL RESECTION OF BLADDER TUMOR N/A 07/08/2021   Procedure: TRANSURETHRAL RESECTION OF BLADDER TUMOR (TURBT)/FULGERATION;  Surgeon: Noel Christmas, MD;  Location: [facility];  Service: Urology;  Laterality: N/A;  45 MINS   TRANSURETHRAL RESECTION OF BLADDER TUMOR N/A 12/02/2021   Procedure: TRANSURETHRAL RESECTION OF BLADDER TUMOR (TURBT);  Surgeon: Noel Christmas, MD;  Location: [facility];  Service: Urology;  Laterality: N/A;   TRANSURETHRAL RESECTION OF PROSTATE  2011   UPPER GI ENDOSCOPY  YRS AGO    Family History  Problem Relation Age of Onset   Hypertension Mother    Breast cancer Mother    Other Mother        brain tumor   Alzheimer's disease Mother    Emphysema Father        smoker   Social History:  reports that he has never smoked. He has never used smokeless tobacco. He reports that he does not currently use alcohol after a past usage of about 1.0 - 2.0 standard drink of alcohol per week. He reports that he does not use drugs.  Allergies:  Allergies  Allergen Reactions   Macrobid  [Nitrofurantoin] Shortness Of Breath   Ciprofloxacin Diarrhea    Occurred with 750mg  dose.  Has since taken 500mg  doses and has had no reaction/diarrhea.   Tramadol Hcl     Pt unsure of reaction    Cephalexin Rash    Rash on arms   [name] [Mitomycin] Rash    All over body    No medications prior to admission.    No results found for this or any previous visit (from the past 48 hour(s)). No results found.  Review of Systems  Constitutional:  Negative for chills and fever.  Genitourinary:  Positive for hematuria.  All other systems reviewed and are negative.   There were no vitals taken for this visit. Physical Exam Vitals reviewed.  HENT:     Head: Normocephalic.  Eyes:     Pupils: Pupils are equal, round, and reactive to light.  Cardiovascular:     Rate and Rhythm: Normal rate.  Pulmonary:     Effort: Pulmonary effort is normal.  Abdominal:     General: Abdomen is flat.     Comments: Prior scars w/o hernias  Genitourinary:    Comments: No CVAT at present Musculoskeletal:        General: Normal range of motion.     Cervical back: Normal range of motion.  Skin:    General: Skin is warm.  Neurological:     General: No focal deficit present.     Mental Status: He is alert.  Psychiatric:        Mood and Affect: Mood normal.      Assessment/Plan  Proceed as planned with TURBT, Rt retrograde. Risks, benefits, alternatives, expected peri-op course discussed previously and reiterated today.   Loletta Parish., MD 07/21/2023, 6:42 AM

## 2023-07-21 NOTE — Transfer of Care (Signed)
Immediate Anesthesia Transfer of Care Note  Patient: [name] [name]  Procedure(s) Performed: TRANSURETHRAL RESECTION OF BLADDER TUMOR (TURBT) CYSTOSCOPY WITH RIGHT RETROGRADE PYELOGRAM (Right)  Patient Location: PACU  Anesthesia Type:General  Level of Consciousness: drowsy  Airway & Oxygen Therapy: Patient Spontanous Breathing and Patient connected to face mask oxygen  Post-op Assessment: Report given to RN and Post -op Vital signs reviewed and stable  Post vital signs: Reviewed and stable  Last Vitals:  Vitals Value Taken Time  BP    Temp    Pulse 83 07/21/23 1027  Resp 12 07/21/23 1027  SpO2 100 % 07/21/23 1027  Vitals shown include unfiled device data.  Last Pain:  Vitals:   07/21/23 0837  TempSrc:   PainSc: 0-No pain         Complications: No notable events documented.

## 2023-07-21 NOTE — Anesthesia Postprocedure Evaluation (Signed)
Anesthesia Post Note  Patient: [name] [name]  Procedure(s) Performed: TRANSURETHRAL RESECTION OF BLADDER TUMOR (TURBT) CYSTOSCOPY WITH RIGHT RETROGRADE PYELOGRAM (Right)     Patient location during evaluation: PACU Anesthesia Type: General Level of consciousness: awake and alert Pain management: pain level controlled Vital Signs Assessment: post-procedure vital signs reviewed and stable Respiratory status: spontaneous breathing, nonlabored ventilation and respiratory function stable Cardiovascular status: blood pressure returned to baseline and stable Postop Assessment: no apparent nausea or vomiting Anesthetic complications: no   No notable events documented.  Last Vitals:  Vitals:   07/21/23 1115 07/21/23 1133  BP: 128/70 138/79  Pulse: 77 79  Resp: 10   Temp:  36.4 C  SpO2: 100% 100%    Last Pain:  Vitals:   07/21/23 1133  TempSrc:   PainSc: 0-No pain                 Lowella Curb

## 2023-07-21 NOTE — Anesthesia Procedure Notes (Signed)
Procedure Name: LMA Insertion Date/Time: 07/21/2023 9:39 AM  Performed by: Florene Route, CRNAPatient Re-evaluated:Patient Re-evaluated prior to induction Oxygen Delivery Method: Circle system utilized Preoxygenation: Pre-oxygenation with 100% oxygen Induction Type: IV induction LMA: LMA inserted LMA Size: 4.0 Number of attempts: 1 Placement Confirmation: positive ETCO2 and breath sounds checked- equal and bilateral Tube secured with: Tape Dental Injury: Teeth and Oropharynx as per pre-operative assessment

## 2023-07-21 NOTE — Discharge Instructions (Signed)
1 - You may have urinary urgency (bladder spasms) and bloody urine on / off for up to 2 weeks.  This is normal.  2 - Call MD or go to ER for fever >102, severe pain / nausea / vomiting not relieved by medications, or acute change in medical status  

## 2023-07-22 ENCOUNTER — Encounter (HOSPITAL_COMMUNITY): Payer: Self-pay | Admitting: Urology

## 2023-07-23 NOTE — Op Note (Signed)
[name] [name], [name] [name] MEDICAL RECORD NO: [id number] ACCOUNT NO: [id number] DATE OF BIRTH: [birth year] FACILITY: [facility] LOCATION: [facility] PHYSICIAN: Sebastian Ache, MD  Operative Report   DATE OF PROCEDURE: 07/21/2023  PREOPERATIVE DIAGNOSIS:  Recurrent bladder cancer.  PROCEDURE PERFORMED:  1.  Cystoscopy with right retrograde pyelogram interpretation. 2.  Transurethral resection of bladder tumor, volume medium.  ESTIMATED BLOOD LOSS:  Nil.  COMPLICATIONS:  None.  SPECIMENS: 1.  Recurrent bladder tumor. 2.  Base of bladder tumor. 3.  Bladder neck tissue for permanent pathology.  FINDINGS:  1.  Multifocal papillary bladder tumor recurrence at the dome, left trigone area, some bladder neck, prostatic urethral tissue concerning for tumor. 2.  Unremarkable right retrograde pyelogram of solitary right kidney.  DRAIN:  None.  INDICATIONS:  The patient is a very pleasant 89 year old man with history of multifocal urothelial carcinoma both in the bladder and his left renal pelvis.  He is status post left nephroureterectomy last year and has done well from that. However, he was  found on surveillance to have multifocal lower tract recurrence with multifocal bladder tumor.  Options were discussed for management including recommended path of transurethral resection for diagnostic and therapeutic intent with right retrograde and he  wished to proceed.  Notably, we did discuss cystectomy previously and he declined.  PROCEDURE IN DETAIL:  The patient being verified, procedure being cystoscopy with right retrograde and transurethral resection of bladder tumor was confirmed.  Procedure timeout was performed.  Intravenous antibiotics were administered.  General LMA  anesthesia induced.  The patient was placed into a low lithotomy position.  Sterile field was created, prepping and draping the patient's penis, perineum, and proximal thighs using iodine.  Cystourethroscopy performed using  21-French rigid cystoscope  with offset lens.  Inspection of anterior and posterior urethra did reveal a prior TURP defect with some very subtle papillary changes, questionably in the prostatic fossa.  Inspection of urinary bladder revealed multifocal bladder tumor, total surface  area approximately 3 cm2 across multiple foci, dominant foci, bladder dome and left bladder neck area close to the area of prior left ureteral orifice.  Attention was directed to right retrograde pyelogram, right ureteral orifice was cannulated with a  6-French end-hole catheter, and right retrograde pyelogram was obtained.  Right retrograde pyelogram demonstrated single right ureter, single system right kidney.  No filling defects or narrowing noted.  Cystoscope was exchanged for a 26-French resectoscope sheath with visual obturator.  Next, using medium size loop, each  focus of tumor was very carefully resected down to superficial fibromuscular stroma of the urinary bladder.  This generated several bladder tumor fragments which were irrigated and set aside, labeled as such.  Next, cold cup biopsy forceps were used to  obtain representative deep seromuscular bites from both the left trigone area and the bladder dome area, set aside labeled as base of bladder tumor. The loop was once again used to provide fulguration current to the base of the resection sites as well as  the lateral edge.  There was no evidence of any perforation and the solitary right ureteral orifice remained visibly patent.  I reexamined the area of the bladder neck and prostatic fossa and again our main concern is that there may be some subtle  papillary tumor in this location as well.  Therefore, the resectoscope loop was used to sample the bladder neck, prostatic urethra junction.  These fragments were set aside and labeled permanent pathology as well.  This was fulgurated.  Following this,  excellent hemostasis, no evidence of any perforation, we achieved  the goal of surgery today.  Bladder was partially emptied per cystoscope.  Procedure was then terminated.  The patient tolerated procedure well, no immediate periprocedural complications.   The patient was taken to postanesthesia care unit in stable condition.  Plan for discharge home.   SHW D: 07/21/2023 10:20:25 am T: 07/21/2023 10:51:00 am  JOB: [id number]/ [id number]

## 2023-07-29 LAB — SURGICAL PATHOLOGY

## 2023-08-02 DIAGNOSIS — C678 Malignant neoplasm of overlapping sites of bladder: Secondary | ICD-10-CM | POA: Diagnosis not present

## 2023-08-02 DIAGNOSIS — Z905 Acquired absence of kidney: Secondary | ICD-10-CM | POA: Diagnosis not present

## 2023-08-02 DIAGNOSIS — C662 Malignant neoplasm of left ureter: Secondary | ICD-10-CM | POA: Diagnosis not present

## 2023-08-02 DIAGNOSIS — N184 Chronic kidney disease, stage 4 (severe): Secondary | ICD-10-CM | POA: Diagnosis not present

## 2023-08-03 DIAGNOSIS — L509 Urticaria, unspecified: Secondary | ICD-10-CM | POA: Diagnosis not present

## 2023-08-05 NOTE — Progress Notes (Signed)
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  Malignant neoplasm of urinary bladder (HCC) Currently diagnosis: BCG refractory NMIBC.  We discussed trial result using pembrolizumab from Cohort B of [id number]-057, which included 132 individuals with papillary high-risk NMIBC disease without CIS. Upon study entry, 57% of patients had high-grade Ta disease, and 43% had T1 disease. Most patients had recurrent NMIBC (60%), with the remainder having either persistent (26%) or progressive (14%) disease.  After the first 12 weeks on study, if patients did not attain a complete response, they discontinued treatment; otherwise, they continued pembrolizumab for up to 35 total cycles (~ 2 years).  Twelve months into the study, 43.5% of patients remained free of high-risk NMIBC, and 41.7% remained free of any disease, including low-grade Ta NMIBC. Additionally, 88.2% had no disease progression or death, and 96.2% remained alive.  Treatment-related adverse events occurred in 97 (73%) of 132 patients; 19 (14%) had a grade 3 or 4 treatment-related adverse event; the most common grade 3 or 4 treatment-related adverse events were colitis (in three [2%] patients) and diarrhea (in two [2%]). 17 (13%) of 132 patients experienced serious treatment-related adverse events, of which colitis (three patients [2%]) was most common. No treatment-related deaths occurred.  Side effects of immunotherapy are mostly related to immune related reaction.  They depend on which organ may be affected.  Patient can have hyper or hypothyroidism, skin rash, fever,  pneumonitis, hepatitis, carditis, colitis, nephritis or other endorgan damage from immune related reaction.  Severe fatal reaction such as carditis or encephalitis has been reported. Rarely severe side effects can result in death.    After discussion [name] [name] understands and would like to proceed.  However, he is currently living alone, with significant arthritis, and resolving rash.  He is son is moving from [place] to be living with him to help taking care of him.  He is aware of the potential rare severe side effects.  We decided to wait until his son moving in next month to start treatment.    Pembrolizumab order to be started about 10/24. This can be adjusted as needed. Follow up with me on 10/19 with his family.  CKD (chronic kidney disease) stage 4, GFR 15-29 ml/min (HCC) Cr 2.67-3 in the last year Continue adequate hydration Monitor closely Continue follow-up with nephrology as well.   Normocytic anemia Check B12, folate and ferritin, iron panel  Dermatitis Continue antihistamine Hydrocortisone 1% cream twice for 2 weeks until rash resolved. Keep Dermatology appointment for now.   Orders Placed This Encounter  Procedures   CBC with Differential (Cancer Center Only)    Standing Status:   Future    Number of Occurrences:   1    Standing Expiration Date:   08/05/2024   CMP (Cancer Center only)    Standing Status:   Future    Number of Occurrences:   1    Standing Expiration Date:   08/05/2024   Folate    Standing Status:   Future    Number of Occurrences:   1    Standing Expiration Date:   08/05/2024   Vitamin B12    Standing Status:   Future  Number of Occurrences:   1    Standing Expiration Date:   08/05/2024   Ferritin    Standing Status:   Future    Number of Occurrences:   1    Standing Expiration Date:   08/05/2024   CBC with Differential (Cancer Center Only)    Standing Status:   Future    Standing Expiration Date:   09/15/2024   CMP (Cancer Center only)     Standing Status:   Future    Standing Expiration Date:   09/15/2024   T4    Standing Status:   Future    Standing Expiration Date:   09/15/2024   TSH    Standing Status:   Future    Standing Expiration Date:   09/15/2024    The total time spent in the appointment was 89 minutes encounter with patients including review of chart and various tests results, discussions about plan of care and coordination of care plan   All questions were answered. The patient knows to call the clinic with any problems, questions or concerns. No barriers to learning was detected.  [name] Sartorius, MD 9/13/202411:19 AM  CHIEF COMPLAINTS/PURPOSE OF CONSULTATION:  Bladder cancer  HISTORY OF PRESENTING ILLNESS:  [name] [name] 89 y.o. male is here because of recurrent bladder cancer. I have reviewed his chart and materials related to his cancer extensively and collaborated history with the patient. Summary of oncologic history is as follows:  Report he has been physically active most of his life. He lost weight from [name]. He has hematuria intermittently.  He noted hematuria when there is recurrence. Sometimes he passes clot. He has some hesitation and pain sometimes.   Report he has significant arthritis and works with PT actively.  He sees Nephrology [name] [name] and is given iron.  Report his has rash, patchy, itching started recently and helped with antihistamine. He has appointment with Dermatology.  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  Oncology History Overview Note  2000 BPH with elevated PSA. Report biopsy was benign.   2011 to now with multiple TURBT   2021 TaG3 bladder cancer. TURBT + BCG x 6  2023 TaG1 bladder + left renal pelvis cancer, failed [name] >>  08/2022 Left nephroureterectomy + RPLND. 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  06/2023 cysto w/ TURBT multifocal papillary bladder tumor recurrence (dome, left trigone, bladder neck, prostate urethral tissue) ==> TaG3 with negative muscle  Path: A. BLADDER TUMOR, TURBT:  Noninvasive high grade papillary urothelial carcinoma with inverted  growth pattern  muscularis propria (detrusor muscle) is present and not involved by  carcinoma   B. BLADDER TUMOR, NECK, BIOPSY:  Noninvasive high-grade papillary urothelial carcinoma with inverted  growth pattern  Submucosa, prostatic glands and muscularis is present and not involved  by carcinoma   C. BLADDER TUMOR, BASE, BIOPSY:  Noninvasive high grade papillary urothelial carcinoma  Muscularis propria is present and not involved by carcinoma       Malignant neoplasm of urinary bladder (HCC)  09/15/2019 Initial Diagnosis   Malignant neoplasm of urinary bladder (HCC)   08/05/2023 Cancer Staging  Staging form: Urinary Bladder, AJCC 8th Edition - Clinical: Stage 0a (rcTa, cN0, cM0) - Signed by [name] Sartorius, MD on 08/05/2023 Stage prefix: Recurrence WHO/ISUP grade (low/high): High Grade Histologic grading system: 2 grade system   09/16/2023 -  Chemotherapy   Patient is on Treatment Plan : BLADDER Pembrolizumab (200) q21d       MEDICAL HISTORY:  Past Medical History:  Diagnosis Date   Anemia    Arthritis    lower back bulging disc, hips, knees, thumbs, shoulder   Asthma    as a pre teen   Bladder cancer (HCC) UROLOGIST-  DR MCKENZIE   S/P TURBT 08-16-2017   BPH (benign prostatic hyperplasia)    CKD (chronic kidney disease), stage III (HCC)    STAGE 3  B CKD per lov 10-16-2020 dr Thyra Breed Everlene Balls kidney on chart   Dyspnea    Fatigue    MIDDLE OF DAY ON  OCCASION   Frequency of urination    GERD (gastroesophageal reflux disease)    on protonix   Hematuria    Hematuria    History of colon polyps    HLD (hyperlipidemia)    HOH (hard of hearing)    slightly hoh right worse thanleft   HTN (hypertension)    Hypothyroidism    Incontinence of urine    weras pads all the time   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   Right Buccal Cheek   Pre-diabetes    Recurrent bladder papillary carcinoma (HCC) hx 2012 and 2014-- urologist- dr Ronne Binning   s/p  TURBT 08-16-2017  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   Sleep apnea    uses cpap set on 20 /4   Trigger finger of left hand 07/04/2021   last few months per pt   UTI (urinary tract infection) 08/20/2020   Weakness of extremity    left knee   Wears glasses     SURGICAL HISTORY: Past Surgical History:  Procedure Laterality Date   BCG X 2     AFTER LAST 2 BLADDER TUMOR REMOVALS   CATARACT EXTRACTION, BILATERAL     COLONSCOPY  YRS AGO   CYSTOSCOPY N/A 10/29/2020   Procedure: CYSTOSCOPY;  Surgeon: Noel Christmas, MD;  Location: [facility];  Service: Urology;  Laterality: N/A;   CYSTOSCOPY N/A 07/08/2021   Procedure: CYSTOSCOPY;  Surgeon: Noel Christmas, MD;  Location: [facility];  Service: Urology;  Laterality: N/A;   CYSTOSCOPY W/ RETROGRADES Right 07/21/2023   Procedure: CYSTOSCOPY WITH RIGHT RETROGRADE PYELOGRAM;  Surgeon: Loletta Parish., MD;  Location: [facility];  Service: Urology;  Laterality: Right;   CYSTOSCOPY W/ URETERAL STENT PLACEMENT Bilateral 08/16/2017   Procedure: CYSTOSCOPY WITH RETROGRADE PYELOGRAM/URETERAL STENT PLACEMENT;  Surgeon: Malen Gauze, MD;  Location: [facility];  Service: Urology;  Laterality: Bilateral;   CYSTOSCOPY W/ URETERAL STENT PLACEMENT Left 09/02/2022   Procedure: CYSTOSCOPY WITH RETROGRADE PYELOGRAM, URETERAL STENT PLACEMENT;  Surgeon: Sebastian Ache, MD;   Location: [facility];  Service: Urology;  Laterality: Left;   CYSTOSCOPY WITH BIOPSY N/A 06/25/2020   Procedure: CYSTOSCOPY FULGURATION   BLADDER  BIOPSY;  Surgeon: Noel Christmas, MD;  Location: [facility];  Service: Urology;  Laterality: N/A;   CYSTOSCOPY WITH FULGERATION N/A 12/08/2019   Procedure: CYSTOSCOPY WITH FULGERATION;  Surgeon: Malen Gauze, MD;  Location: [facility];  Service: Urology;  Laterality: N/A;   CYSTOSCOPY WITH FULGERATION N/A  03/24/2022   Procedure: CYSTOSCOPY WITH FULGERATION;  Surgeon: Noel Christmas, MD;  Location: [facility];  Service: Urology;  Laterality: N/A;   CYSTOSCOPY WITH RETROGRADE PYELOGRAM, URETEROSCOPY AND STENT PLACEMENT Left 03/24/2022   Procedure: CYSTOSCOPY WITH RETROGRADE PYELOGRAM, DIAGNOSTIC URETEROSCOPY WITH BIOPSY AND STENT PLACEMENT;  Surgeon: Noel Christmas, MD;  Location: [facility];  Service: Urology;  Laterality: Left;  90 MINUTES   CYSTOSCOPY WITH RETROGRADE PYELOGRAM, URETEROSCOPY AND STENT PLACEMENT Left 04/07/2022   Procedure: CYSTOSCOPY WITH  RETROGRADE PYELOGRAM, DIAGNOSTIC URETEROSCOPY WITH BIOPSY AND STENT EXCHANGE;  Surgeon: Noel Christmas, MD;  Location: [facility];  Service: Urology;  Laterality: Left;  75 MINUTES   CYSTOSCOPY WITH RETROGRADE PYELOGRAM, URETEROSCOPY AND STENT PLACEMENT Left 08/11/2022   Procedure: CYSTOSCOPY WITH RETROGRADE PYELOGRAM, ANTEGRADE NEPHROSTOGRAM, DIAGNOSTIC  URETEROSCOPY;  Surgeon: Noel Christmas, MD;  Location: [facility];  Service: Urology;  Laterality: Left;  90 MINS   HOLMIUM LASER APPLICATION Left 08/11/2022   Procedure: HOLMIUM LASER APPLICATION;  Surgeon: Noel Christmas, MD;  Location: [facility];  Service: Urology;  Laterality: Left;   IR NEPHROSTOMY EXCHANGE LEFT  05/15/2022   IR NEPHROSTOMY EXCHANGE LEFT  07/10/2022   IR NEPHROSTOMY PLACEMENT LEFT  05/06/2022   KNEE ARTHROSCOPY Left 06/10/2011   LYMPH NODE DISSECTION Left 09/02/2022   Procedure: RETROPERITONEAL LYMPH  NODE DISSECTION;  Surgeon: Sebastian Ache, MD;  Location: [facility];  Service: Urology;  Laterality: Left;   PROSTATE BIOPSY  2000   ROBOT ASSITED LAPAROSCOPIC NEPHROURETERECTOMY Left 09/02/2022   Procedure: XI ROBOT ASSITED LAPAROSCOPIC NEPHROURETERECTOMY;  Surgeon: Sebastian Ache, MD;  Location: [facility];  Service: Urology;  Laterality: Left;   TRANSURETHRAL RESECTION OF BLADDER TUMOR N/A 08/16/2017   Procedure: TRANSURETHRAL RESECTION OF BLADDER TUMOR (TURBT);  Surgeon: Malen Gauze, MD;  Location: [facility];  Service: Urology;  Laterality: N/A;   TRANSURETHRAL RESECTION OF BLADDER TUMOR  12-09-2012   dr Marcelyn Bruins [facility]   and TURP   TRANSURETHRAL RESECTION OF BLADDER TUMOR N/A 09/20/2017   Procedure: TRANSURETHRAL RESECTION OF BLADDER TUMOR (TURBT);  Surgeon: Malen Gauze, MD;  Location: [facility];  Service: Urology;  Laterality: N/A;   TRANSURETHRAL RESECTION OF BLADDER TUMOR N/A 12/08/2019   Procedure: TRANSURETHRAL RESECTION OF BLADDER TUMOR;  Surgeon: Malen Gauze, MD;  Location: [facility];  Service: Urology;  Laterality: N/A;  1 HR   TRANSURETHRAL RESECTION OF BLADDER TUMOR N/A 10/29/2020   Procedure: BLADDER FULGERATION;  Surgeon: Noel Christmas, MD;  Location: [facility];  Service: Urology;  Laterality: N/A;  30 MINS   TRANSURETHRAL RESECTION OF BLADDER TUMOR N/A 07/08/2021   Procedure: TRANSURETHRAL RESECTION OF BLADDER TUMOR (TURBT)/FULGERATION;  Surgeon: Noel Christmas, MD;  Location: [facility];  Service: Urology;  Laterality: N/A;  45 MINS   TRANSURETHRAL RESECTION OF BLADDER TUMOR N/A 12/02/2021   Procedure: TRANSURETHRAL RESECTION OF BLADDER TUMOR (TURBT);  Surgeon: Noel Christmas, MD;  Location: [facility];  Service: Urology;  Laterality: N/A;   TRANSURETHRAL RESECTION OF BLADDER TUMOR N/A 07/21/2023   Procedure: TRANSURETHRAL RESECTION OF BLADDER TUMOR (TURBT);  Surgeon:  Loletta Parish., MD;  Location: [facility];  Service: Urology;  Laterality: N/A;  60 MINUTES   TRANSURETHRAL RESECTION OF PROSTATE  2011   UPPER GI ENDOSCOPY  YRS AGO    SOCIAL HISTORY: Social History   Socioeconomic History   Marital status: Widowed    Spouse name: Not on file  Number of children: 3   Years of education: Not on file   Highest education level: Not on file  Occupational History   Occupation: retired  Tobacco Use   Smoking status: Never   Smokeless tobacco: Never  Vaping Use   Vaping status: Never Used  Substance and Sexual Activity   Alcohol use: Not Currently    Alcohol/week: 1.0 - 2.0 standard drink of alcohol    Types: 1 - 2 Glasses of wine per week    Comment: rare   Drug use: No   Sexual activity: Not Currently  Other Topics Concern   Not on file  Social History Narrative   Not on file   Social Determinants of Health   Financial Resource Strain: Not on file  Food Insecurity: No Food Insecurity (09/02/2022)   Hunger Vital Sign    Worried About Running Out of Food in the Last Year: Never true    Ran Out of Food in the Last Year: Never true  Transportation Needs: No Transportation Needs (09/02/2022)   PRAPARE - Administrator, Civil Service (Medical): No    Lack of Transportation (Non-Medical): No  Physical Activity: Not on file  Stress: Not on file  Social Connections: Not on file  Intimate Partner Violence: Not At Risk (09/02/2022)   Humiliation, Afraid, Rape, and Kick questionnaire    Fear of Current or Ex-Partner: No    Emotionally Abused: No    Physically Abused: No    Sexually Abused: No    FAMILY HISTORY: Family History  Problem Relation Age of Onset   Hypertension Mother    Breast cancer Mother    Other Mother        brain tumor   Alzheimer's disease Mother    Emphysema Father        smoker    ALLERGIES:  is allergic to macrobid [nitrofurantoin], ciprofloxacin, tramadol hcl, cephalexin, and jelmyto  [mitomycin].  MEDICATIONS:  Current Outpatient Medications  Medication Sig Dispense Refill   levocetirizine (XYZAL) 5 MG tablet 1 tablet in the evening for 7 days then daily as needed Orally Once a day for 30 days     acetaminophen (TYLENOL) 500 MG tablet Take 1,000 mg by mouth every 6 (six) hours as needed for moderate pain.     amLODipine (NORVASC) 10 MG tablet Take 1 tablet (10 mg total) by mouth daily. (Patient taking differently: Take 10 mg by mouth at bedtime.) 90 tablet 1   aspirin EC 81 MG tablet Take 81 mg by mouth daily. Swallow whole.     ferrous sulfate 325 (65 FE) MG tablet Take 325 mg by mouth 2 (two) times daily with a meal.     finasteride (PROSCAR) 5 MG tablet TAKE ONE TABLET BY MOUTH DAILY (Patient taking differently: Take 5 mg by mouth at bedtime.) 90 tablet 3   levothyroxine (SYNTHROID, LEVOTHROID) 50 MCG tablet Take 50 mcg by mouth daily before breakfast.     mirabegron ER (MYRBETRIQ) 50 MG TB24 tablet Take 50 mg by mouth at bedtime.     NON FORMULARY Pt uses a cpap nightly     oxyCODONE (ROXICODONE) 5 MG immediate release tablet Take 1 tablet (5 mg total) by mouth every 6 (six) hours as needed for moderate pain or severe pain (Post-operatively). 10 tablet 0   pantoprazole (PROTONIX) 20 MG tablet Take 20 mg by mouth at bedtime.     psyllium (METAMUCIL) 58.6 % powder Take 0.5 packets by mouth every evening.  rosuvastatin (CRESTOR) 5 MG tablet Take 5 mg by mouth at bedtime.     senna-docusate (SENOKOT-S) 8.6-50 MG tablet Take 1 tablet by mouth 2 (two) times daily. While taking strong pain meds to prevent constipation 10 tablet 0   silodosin (RAPAFLO) 8 MG CAPS capsule TAKE ONE CAPSULE BY MOUTH EVERY NIGHT AT BEDTIME 90 capsule 3   No current facility-administered medications for this visit.    REVIEW OF SYSTEMS:   Constitutional: Denies fevers, weight loss Eyes: Denies blurriness of vision, double vision Mouth: Denies sore throat Respiratory: Denies cough, shortness  of breath or wheezes Cardiovascular: Denies chest pain, chest discomfort or lower extremity swelling Gastrointestinal:  Denies nausea, vomiting, abdominal pain, diarrhea, constipation or blood stool. GU: Denies any dysuria, +hematuria, hesitancy Skin: report skin rashes over lower arms, abdomen comes and goes after mitomycin Lymphatics: Denies new lymphadenopathy but has easy bruising Neurological: Denies numbness, tingling or new weaknesses All other systems were reviewed with the patient and are negative.  PHYSICAL EXAMINATION: ECOG PERFORMANCE STATUS: 0 - Asymptomatic  Vitals:   08/06/23 0919  BP: 128/67  Pulse: 77  Resp: 18  Temp: (!) 97.5 F (36.4 C)  SpO2: 100%   Filed Weights   08/06/23 0919  Weight: 166 lb 11.2 oz (75.6 kg)    GENERAL: alert, no distress and comfortable SKIN: skin color is normal, no jaundice, rashes or significant lesions EYES: sclera clear OROPHARYNX: no exudate, no erythema NECK: supple LYMPH:  no palpable lymphadenopathy in the cervical, axillary regions LUNGS: Effort normal, no respiratory distress.  Clear to auscultation bilaterally HEART: regular rate & rhythm and no lower extremity edema ABDOMEN: soft, non-tender and nondistended Musculoskeletal: no point tenderness NEURO: no focal motor/sensory deficits  LABORATORY DATA:  I have reviewed the data as listed Lab Results  Component Value Date   WBC 5.7 07/16/2023   HGB 11.0 (L) 07/16/2023   HCT 34.3 (L) 07/16/2023   MCV 95.0 07/16/2023   PLT 265 07/16/2023   Recent Labs    09/03/22 0441 09/04/22 0424 07/16/23 0850  NA 135 139 138  K 4.6 4.8 4.4  CL 111 111 106  CO2 17* 20* 22  GLUCOSE 150* 108* 102*  BUN 40* 45* 48*  CREATININE 3.00* 2.96* 3.07*  CALCIUM 8.1* 8.2* 9.3  GFRNONAA 19* 20* 19*    RADIOGRAPHIC STUDIES: I have personally reviewed the radiological images as listed and agreed with the findings in the report. DG C-Arm 1-60 Min-No Report  Result Date:  07/21/2023 Fluoroscopy was utilized by the requesting physician.  No radiographic interpretation.

## 2023-08-06 ENCOUNTER — Inpatient Hospital Stay: Payer: Medicare Other

## 2023-08-06 ENCOUNTER — Other Ambulatory Visit: Payer: Self-pay

## 2023-08-06 ENCOUNTER — Telehealth: Payer: Self-pay

## 2023-08-06 VITALS — BP 128/67 | HR 77 | Temp 97.5°F | Resp 18 | Ht 68.5 in | Wt 166.7 lb

## 2023-08-06 DIAGNOSIS — Z800 Family history of malignant neoplasm of digestive organs: Secondary | ICD-10-CM | POA: Insufficient documentation

## 2023-08-06 DIAGNOSIS — N289 Disorder of kidney and ureter, unspecified: Secondary | ICD-10-CM | POA: Insufficient documentation

## 2023-08-06 DIAGNOSIS — L309 Dermatitis, unspecified: Secondary | ICD-10-CM | POA: Insufficient documentation

## 2023-08-06 DIAGNOSIS — N184 Chronic kidney disease, stage 4 (severe): Secondary | ICD-10-CM | POA: Diagnosis not present

## 2023-08-06 DIAGNOSIS — D649 Anemia, unspecified: Secondary | ICD-10-CM

## 2023-08-06 DIAGNOSIS — C678 Malignant neoplasm of overlapping sites of bladder: Secondary | ICD-10-CM

## 2023-08-06 DIAGNOSIS — Z803 Family history of malignant neoplasm of breast: Secondary | ICD-10-CM | POA: Diagnosis not present

## 2023-08-06 DIAGNOSIS — C652 Malignant neoplasm of left renal pelvis: Secondary | ICD-10-CM | POA: Diagnosis not present

## 2023-08-06 LAB — CMP (CANCER CENTER ONLY)
ALT: 12 U/L (ref 0–44)
AST: 18 U/L (ref 15–41)
Albumin: 4.4 g/dL (ref 3.5–5.0)
Alkaline Phosphatase: 72 U/L (ref 38–126)
Anion gap: 9 (ref 5–15)
BUN: 46 mg/dL — ABNORMAL HIGH (ref 8–23)
CO2: 25 mmol/L (ref 22–32)
Calcium: 9.6 mg/dL (ref 8.9–10.3)
Chloride: 105 mmol/L (ref 98–111)
Creatinine: 3.41 mg/dL — ABNORMAL HIGH (ref 0.61–1.24)
GFR, Estimated: 17 mL/min — ABNORMAL LOW (ref >60)
Glucose, Bld: 100 mg/dL — ABNORMAL HIGH (ref 70–99)
Potassium: 4.5 mmol/L (ref 3.5–5.1)
Sodium: 139 mmol/L (ref 135–145)
Total Bilirubin: 0.6 mg/dL (ref 0.3–1.2)
Total Protein: 7.9 g/dL (ref 6.5–8.1)

## 2023-08-06 LAB — CBC WITH DIFFERENTIAL (CANCER CENTER ONLY)
Abs Immature Granulocytes: 0.01 10*3/uL (ref 0.00–0.07)
Basophils Absolute: 0 10*3/uL (ref 0.0–0.1)
Basophils Relative: 1 %
Eosinophils Absolute: 0.3 10*3/uL (ref 0.0–0.5)
Eosinophils Relative: 3 %
HCT: 34.1 % — ABNORMAL LOW (ref 39.0–52.0)
Hemoglobin: 11 g/dL — ABNORMAL LOW (ref 13.0–17.0)
Immature Granulocytes: 0 %
Lymphocytes Relative: 7 %
Lymphs Abs: 0.6 10*3/uL — ABNORMAL LOW (ref 0.7–4.0)
MCH: 30.1 pg (ref 26.0–34.0)
MCHC: 32.3 g/dL (ref 30.0–36.0)
MCV: 93.4 fL (ref 80.0–100.0)
Monocytes Absolute: 0.5 10*3/uL (ref 0.1–1.0)
Monocytes Relative: 7 %
Neutro Abs: 6.5 10*3/uL (ref 1.7–7.7)
Neutrophils Relative %: 82 %
Platelet Count: 269 10*3/uL (ref 150–400)
RBC: 3.65 MIL/uL — ABNORMAL LOW (ref 4.22–5.81)
RDW: 14.1 % (ref 11.5–15.5)
WBC Count: 7.9 10*3/uL (ref 4.0–10.5)
nRBC: 0 % (ref 0.0–0.2)

## 2023-08-06 LAB — VITAMIN B12: Vitamin B-12: 337 pg/mL (ref 180–914)

## 2023-08-06 LAB — FERRITIN: Ferritin: 41 ng/mL (ref 24–336)

## 2023-08-06 LAB — FOLATE: Folate: 8.4 ng/mL (ref >5.9)

## 2023-08-06 NOTE — Progress Notes (Signed)
START ON PATHWAY REGIMEN - Bladder     A cycle is every 21 days:     Pembrolizumab   **Always confirm dose/schedule in your pharmacy ordering system**  Patient Characteristics: Pre-Cystectomy or Nonsurgical Candidate, M0 (Clinical Staging), High-Grade cTa, cN0 or cTis/cT1, cN0, Refractory to Intravesical BCG and Referring Urologist Indicates Other Intravesical Therapies Not Preferred Therapeutic Status: Pre-Cystectomy or Nonsurgical Candidate, M0 (Clinical Staging) AJCC M Category: cM0 AJCC 8 Stage Grouping: 0a AJCC T Category: cTa AJCC N Category: cN0 Intent of Therapy: Non-Curative / Palliative Intent, Discussed with Patient

## 2023-08-06 NOTE — Telephone Encounter (Signed)
Per Dr. [name], instructed pt to increase fluid intake as able to 60+ oz per day to avoid worsening kidney function. Also notified that Dr. [name] wants him to receive weekly IV fluids at the [facility] location. Advised to call [name] office if he doesn't hear from someone to set this up by 08/10/23 afternoon. Notified that Vit B12 level is borderline low and that Dr. [name] wants him to take Vit B12 OTC 500 mcg daily. Pt verbalizes understanding of the above information.

## 2023-08-06 NOTE — Patient Instructions (Signed)
We discussed trial result using pembrolizumab from Cohort B of KEYNOTE-057, which included 132 individuals with papillary high-risk NMIBC disease without CIS. Upon study entry, 57% of patients had high-grade Ta disease, and 43% had T1 disease. Most patients had recurrent NMIBC (60%), with the remainder having either persistent (26%) or progressive (14%) disease.  After the first 12 weeks on study, if patients did not attain a complete response, they discontinued treatment; otherwise, they continued pembrolizumab for up to 35 total cycles (~ 2 years).  Twelve months into the study, 43.5% of patients remained free of high-risk NMIBC, and 41.7% remained free of any disease, including low-grade Ta NMIBC. Additionally, 88.2% had no disease progression or death, and 96.2% remained alive.  Treatment-related adverse events occurred in 97 (73%) of 132 patients; 19 (14%) had a grade 3 or 4 treatment-related adverse event; the most common grade 3 or 4 treatment-related adverse events were colitis (in three [2%] patients) and diarrhea (in two [2%]). 17 (13%) of 132 patients experienced serious treatment-related adverse events, of which colitis (three patients [2%]) was most common. No treatment-related deaths occurred.  Side effects of immunotherapy are mostly related to immune related reaction.  They depend on which organ may be affected.  Patient can have hyper or hypothyroidism, skin rash, fever, pneumonitis, hepatitis, carditis, colitis, nephritis or other endorgan damage from immune related reaction.  Severe fatal reaction such as carditis or encephalitis has been reported. Rarely severe side effects can result in death.  After discussion [name] [name] understands and would like to proceed.

## 2023-08-06 NOTE — Progress Notes (Signed)
Weekly IV fluid normal saline 1000 mL, to be given 500 mL/h ordered at [facility].

## 2023-08-06 NOTE — Assessment & Plan Note (Addendum)
Cr 2.67-3 in the last year Continue adequate hydration Monitor closely Continue follow-up with nephrology as well.

## 2023-08-06 NOTE — Assessment & Plan Note (Addendum)
Currently diagnosis: BCG refractory NMIBC.  We discussed trial result using pembrolizumab from Cohort B of [id number]-057, which included 132 individuals with papillary high-risk NMIBC disease without CIS. Upon study entry, 57% of patients had high-grade Ta disease, and 43% had T1 disease. Most patients had recurrent NMIBC (60%), with the remainder having either persistent (26%) or progressive (14%) disease.  After the first 12 weeks on study, if patients did not attain a complete response, they discontinued treatment; otherwise, they continued pembrolizumab for up to 35 total cycles (~ 2 years).  Twelve months into the study, 43.5% of patients remained free of high-risk NMIBC, and 41.7% remained free of any disease, including low-grade Ta NMIBC. Additionally, 88.2% had no disease progression or death, and 96.2% remained alive.  Treatment-related adverse events occurred in 97 (73%) of 132 patients; 19 (14%) had a grade 3 or 4 treatment-related adverse event; the most common grade 3 or 4 treatment-related adverse events were colitis (in three [2%] patients) and diarrhea (in two [2%]). 17 (13%) of 132 patients experienced serious treatment-related adverse events, of which colitis (three patients [2%]) was most common. No treatment-related deaths occurred.  Side effects of immunotherapy are mostly related to immune related reaction.  They depend on which organ may be affected.  Patient can have hyper or hypothyroidism, skin rash, fever, pneumonitis, hepatitis, carditis, colitis, nephritis or other endorgan damage from immune related reaction.  Severe fatal reaction such as carditis or encephalitis has been reported. Rarely severe side effects can result in death.    After discussion Nived understands and would like to proceed.  However, he is currently living alone, with significant arthritis, and resolving rash.  He is son is moving from [place] to be living with him to help taking care of him.  He is  aware of the potential rare severe side effects.  We decided to wait until his son moving in next month to start treatment.    Pembrolizumab order to be started about 10/24. This can be adjusted as needed. Follow up with me on 10/19 with his family.

## 2023-08-06 NOTE — Assessment & Plan Note (Addendum)
Check B12, folate and ferritin, iron panel

## 2023-08-06 NOTE — Assessment & Plan Note (Addendum)
Continue antihistamine Hydrocortisone 1% cream twice for 2 weeks until rash resolved. Keep Dermatology appointment for now.

## 2023-08-07 ENCOUNTER — Other Ambulatory Visit: Payer: Self-pay

## 2023-08-10 ENCOUNTER — Ambulatory Visit (INDEPENDENT_AMBULATORY_CARE_PROVIDER_SITE_OTHER): Payer: Medicare Other

## 2023-08-10 VITALS — BP 113/66 | HR 69 | Temp 97.4°F | Resp 18 | Ht 68.0 in | Wt 166.6 lb

## 2023-08-10 DIAGNOSIS — E782 Mixed hyperlipidemia: Secondary | ICD-10-CM | POA: Diagnosis not present

## 2023-08-10 DIAGNOSIS — R6889 Other general symptoms and signs: Secondary | ICD-10-CM | POA: Diagnosis not present

## 2023-08-10 DIAGNOSIS — R7309 Other abnormal glucose: Secondary | ICD-10-CM | POA: Diagnosis not present

## 2023-08-10 DIAGNOSIS — E860 Dehydration: Secondary | ICD-10-CM | POA: Insufficient documentation

## 2023-08-10 DIAGNOSIS — I700 Atherosclerosis of aorta: Secondary | ICD-10-CM | POA: Diagnosis not present

## 2023-08-10 DIAGNOSIS — N184 Chronic kidney disease, stage 4 (severe): Secondary | ICD-10-CM | POA: Diagnosis not present

## 2023-08-10 DIAGNOSIS — I10 Essential (primary) hypertension: Secondary | ICD-10-CM | POA: Diagnosis not present

## 2023-08-10 DIAGNOSIS — N289 Disorder of kidney and ureter, unspecified: Secondary | ICD-10-CM

## 2023-08-10 MED ORDER — SODIUM CHLORIDE 0.9 % IV BOLUS
1000.0000 mL | Freq: Once | INTRAVENOUS | Status: AC
  Administered 2023-08-10: 1000 mL via INTRAVENOUS
  Filled 2023-08-10: qty 1000

## 2023-08-10 NOTE — Progress Notes (Signed)
Diagnosis: Dehydration  Provider:  Chilton Greathouse MD  Procedure: IV Infusion  IV Type: Peripheral, IV Location: R Forearm  Normal Saline, Dose: 1000 ml  Infusion Start Time: 1434  Infusion Stop Time: 1652  Post Infusion IV Care: Peripheral IV Discontinued  Discharge: Condition: Good, Destination: Home . AVS Provided  Performed by:  Adriana Mccallum, RN

## 2023-08-16 ENCOUNTER — Ambulatory Visit: Payer: Medicare Other

## 2023-08-16 DIAGNOSIS — E039 Hypothyroidism, unspecified: Secondary | ICD-10-CM | POA: Diagnosis not present

## 2023-08-16 DIAGNOSIS — I129 Hypertensive chronic kidney disease with stage 1 through stage 4 chronic kidney disease, or unspecified chronic kidney disease: Secondary | ICD-10-CM | POA: Diagnosis not present

## 2023-08-16 DIAGNOSIS — C678 Malignant neoplasm of overlapping sites of bladder: Secondary | ICD-10-CM | POA: Diagnosis not present

## 2023-08-16 DIAGNOSIS — N184 Chronic kidney disease, stage 4 (severe): Secondary | ICD-10-CM | POA: Diagnosis not present

## 2023-08-16 DIAGNOSIS — I10 Essential (primary) hypertension: Secondary | ICD-10-CM | POA: Diagnosis not present

## 2023-08-16 DIAGNOSIS — I700 Atherosclerosis of aorta: Secondary | ICD-10-CM | POA: Diagnosis not present

## 2023-08-16 DIAGNOSIS — D649 Anemia, unspecified: Secondary | ICD-10-CM | POA: Diagnosis not present

## 2023-08-16 DIAGNOSIS — R7309 Other abnormal glucose: Secondary | ICD-10-CM | POA: Diagnosis not present

## 2023-08-16 DIAGNOSIS — C662 Malignant neoplasm of left ureter: Secondary | ICD-10-CM | POA: Diagnosis not present

## 2023-08-16 DIAGNOSIS — Z0000 Encounter for general adult medical examination without abnormal findings: Secondary | ICD-10-CM | POA: Diagnosis not present

## 2023-08-16 DIAGNOSIS — E782 Mixed hyperlipidemia: Secondary | ICD-10-CM | POA: Diagnosis not present

## 2023-08-17 ENCOUNTER — Ambulatory Visit: Payer: Medicare Other | Admitting: *Deleted

## 2023-08-17 VITALS — BP 126/74 | HR 78 | Temp 97.4°F | Resp 14 | Ht 68.5 in | Wt 165.2 lb

## 2023-08-17 DIAGNOSIS — N289 Disorder of kidney and ureter, unspecified: Secondary | ICD-10-CM

## 2023-08-17 DIAGNOSIS — E860 Dehydration: Secondary | ICD-10-CM

## 2023-08-17 MED ORDER — SODIUM CHLORIDE 0.9 % IV BOLUS
1000.0000 mL | Freq: Once | INTRAVENOUS | Status: AC
  Administered 2023-08-17: 1000 mL via INTRAVENOUS

## 2023-08-17 NOTE — Progress Notes (Signed)
Diagnosis: Dehydration  Provider:  Chilton Greathouse MD  Procedure: IV Infusion  IV Type: Peripheral, IV Location: R Upper Arm  Normal Saline, Dose: 1000 ml  Infusion Start Time: 1323 pm  Infusion Stop Time: 1535 pm  Post Infusion IV Care: Observation period completed and Peripheral IV Discontinued  Discharge: Condition: Good, Destination: Home . AVS Provided  Performed by:  Forrest Moron, RN

## 2023-08-24 ENCOUNTER — Ambulatory Visit: Payer: Medicare Other | Admitting: *Deleted

## 2023-08-24 VITALS — BP 122/73 | HR 79 | Temp 97.4°F | Resp 12 | Ht 68.5 in | Wt 165.6 lb

## 2023-08-24 DIAGNOSIS — E860 Dehydration: Secondary | ICD-10-CM | POA: Diagnosis not present

## 2023-08-24 DIAGNOSIS — N289 Disorder of kidney and ureter, unspecified: Secondary | ICD-10-CM

## 2023-08-24 MED ORDER — SODIUM CHLORIDE 0.9 % IV BOLUS
1000.0000 mL | Freq: Once | INTRAVENOUS | Status: AC
  Administered 2023-08-24: 1000 mL via INTRAVENOUS
  Filled 2023-08-24: qty 1000

## 2023-08-24 NOTE — Progress Notes (Signed)
Diagnosis: Dehydration  Provider:  Chilton Greathouse MD  Procedure: IV Infusion  IV Type: Peripheral, IV Location: R Antecubital  Normal Saline, Dose: 1000 ml  Infusion Start Time: 1312 pm  Infusion Stop Time: 1537 pm  Post Infusion IV Care: Observation period completed and Peripheral IV Discontinued  Discharge: Condition: Good, Destination: Home . AVS Provided  Performed by:  Forrest Moron, RN

## 2023-08-31 ENCOUNTER — Ambulatory Visit: Payer: Medicare Other

## 2023-08-31 VITALS — BP 150/78 | HR 79 | Temp 96.1°F | Resp 16 | Ht 68.5 in

## 2023-08-31 DIAGNOSIS — N289 Disorder of kidney and ureter, unspecified: Secondary | ICD-10-CM

## 2023-08-31 DIAGNOSIS — E860 Dehydration: Secondary | ICD-10-CM | POA: Diagnosis not present

## 2023-08-31 MED ORDER — SODIUM CHLORIDE 0.9 % IV BOLUS
1000.0000 mL | Freq: Once | INTRAVENOUS | Status: AC
  Administered 2023-08-31: 1000 mL via INTRAVENOUS
  Filled 2023-08-31: qty 1000

## 2023-08-31 NOTE — Progress Notes (Signed)
Diagnosis: Dehydration  Provider:  Chilton Greathouse MD  Procedure: IV Infusion  IV Type: Peripheral, IV Location: L Antecubital  Normal Saline, Dose: 1000 ml  Infusion Start Time: 1313  Infusion Stop Time: 1535  Post Infusion IV Care: Peripheral IV Discontinued  Discharge: Condition: Good, Destination: Home . AVS Provided  Performed by:  Wyvonne Lenz, RN

## 2023-09-03 ENCOUNTER — Telehealth: Payer: Self-pay | Admitting: *Deleted

## 2023-09-03 ENCOUNTER — Other Ambulatory Visit: Payer: Self-pay | Admitting: *Deleted

## 2023-09-03 NOTE — Telephone Encounter (Signed)
[name] [name] states he has been having intermittent back pain for 1 week. . When lifting small amount it takes his breath away,then resolves quickly. He wanted to make Dr [name] aware prior to starting Keytruda on 10/24. He has also reached out to PCP.

## 2023-09-03 NOTE — Telephone Encounter (Signed)
[name] states

## 2023-09-03 NOTE — Telephone Encounter (Signed)
[name] [name] does not feel he needs an xray at this time,

## 2023-09-07 ENCOUNTER — Ambulatory Visit: Payer: Medicare Other

## 2023-09-07 VITALS — BP 125/72 | HR 79 | Temp 97.3°F | Resp 14 | Ht 68.0 in | Wt 165.6 lb

## 2023-09-07 DIAGNOSIS — N289 Disorder of kidney and ureter, unspecified: Secondary | ICD-10-CM

## 2023-09-07 DIAGNOSIS — E860 Dehydration: Secondary | ICD-10-CM | POA: Diagnosis not present

## 2023-09-07 MED ORDER — SODIUM CHLORIDE 0.9 % IV BOLUS
1000.0000 mL | Freq: Once | INTRAVENOUS | Status: AC
  Administered 2023-09-07: 1000 mL via INTRAVENOUS
  Filled 2023-09-07: qty 1000

## 2023-09-07 NOTE — Progress Notes (Signed)
Diagnosis: Dehydration  Provider:  Chilton Greathouse MD  Procedure: IV Infusion  IV Type: Peripheral, IV Location: R Forearm  Normal Saline, Dose: 1000 ml  Infusion Start Time: 1318  Infusion Stop Time: 1528  Post Infusion IV Care: Patient declined observation and Peripheral IV Discontinued  Discharge: Condition: Good, Destination: Home . AVS Declined  Performed by:  Adriana Mccallum, RN

## 2023-09-09 NOTE — Progress Notes (Signed)

## 2023-09-09 NOTE — Progress Notes (Signed)
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  I discussed indication, efficacy, side effects of pembrolizumab with his son in the room today.  We discussed watching for potential side effects.  His rash has since resolved from last visit.  Given his abnormal renal function, we will repeat labs today.  I will reach out to nephrologist office to check on his creatinine level over the past few months.  Advised him to increase fluid intake to more than 40 ounces daily.  CKD (chronic kidney disease) stage 4, GFR 15-29 ml/min (HCC) Cr 2.67-3 in the last year Continue adequate hydration Monitor closely Continue follow-up with nephrology as well.  Malignant neoplasm of urinary bladder (HCC) Pembrolizumab order to be started about 10/24 Follow up with me every 3 weeks before treatment  Low serum vitamin B12 B12 500 mcg daily   Dermatitis Intermittent rash. No rash.  Zyrtec once if having recurrent rash Hydrocortisone 1% cream twice for 2 weeks if recurring and call for evaluation Keep Dermatology appointment for now.    The patient understands the plans discussed today and is in agreement with them.  He knows to contact our office if he develops concerns prior to his next appointment.  [name], MD  [facility] [address] [place] [address] Dept: [phone] Dept Fax:  [phone]   No orders of the defined types were placed in this encounter.     CHIEF COMPLAINT:  CC: NMIBC  Current Treatment:  about to start pembrolizumab  INTERVAL HISTORY:  [name] [name] is here today for repeat clinical assessment. He denies fevers or chills. He denies pain. His appetite is good. His weight has been stable.  He has appointment with Dr. Michael Litter with [facility]. He gets lab through [employer] about with his appointment. He reports goo urine output. No difficulty with urinating. No hematuria sine recovered from cystoscopy. He drinks about 40 oz day. No nausea or vomiting.  Report back pain related to movement that was sharp lasted for more than a week. He decided to blow the leaves and pain resolved.   No other changes  I have reviewed the past medical history, past surgical history, social history and family history with the patient and they are unchanged from previous note.  ALLERGIES:  is allergic to macrobid [nitrofurantoin], ciprofloxacin, tramadol hcl, cephalexin, and jelmyto [mitomycin].  MEDICATIONS:  Current Outpatient Medications  Medication Sig Dispense Refill   acetaminophen (TYLENOL) 500 MG tablet Take 1,000 mg by mouth every 6 (six) hours as needed for moderate pain.     amLODipine (NORVASC) 10 MG tablet Take 1 tablet (10 mg total) by mouth daily. (Patient taking differently: Take 10 mg by mouth at bedtime.) 90 tablet 1   aspirin EC 81 MG tablet Take 81 mg by mouth daily. Swallow whole.     ferrous sulfate 325 (65 FE) MG tablet Take 325 mg by mouth 2 (two) times daily with a meal.  finasteride (PROSCAR) 5 MG tablet TAKE ONE TABLET BY MOUTH DAILY (Patient taking differently: Take 5 mg by mouth at bedtime.) 90 tablet 3   levocetirizine (XYZAL) 5 MG tablet 1 tablet in the evening for 7 days then daily as needed Orally Once a day for 30 days     levothyroxine (SYNTHROID, LEVOTHROID) 50 MCG tablet Take 50 mcg by mouth daily before breakfast.      mirabegron ER (MYRBETRIQ) 50 MG TB24 tablet Take 50 mg by mouth at bedtime.     NON FORMULARY Pt uses a cpap nightly     oxyCODONE (ROXICODONE) 5 MG immediate release tablet Take 1 tablet (5 mg total) by mouth every 6 (six) hours as needed for moderate pain or severe pain (Post-operatively). 10 tablet 0   pantoprazole (PROTONIX) 20 MG tablet Take 20 mg by mouth at bedtime.     psyllium (METAMUCIL) 58.6 % powder Take 0.5 packets by mouth every evening.     rosuvastatin (CRESTOR) 5 MG tablet Take 5 mg by mouth at bedtime.     senna-docusate (SENOKOT-S) 8.6-50 MG tablet Take 1 tablet by mouth 2 (two) times daily. While taking strong pain meds to prevent constipation 10 tablet 0   silodosin (RAPAFLO) 8 MG CAPS capsule TAKE ONE CAPSULE BY MOUTH EVERY NIGHT AT BEDTIME 90 capsule 3   sodium bicarbonate 650 MG tablet 1 tablet Orally twice a day     No current facility-administered medications for this visit.    HISTORY OF PRESENT ILLNESS:   Oncology History Overview Note  2000 BPH with elevated PSA. Report biopsy was benign.   2011 to now with multiple TURBT   2021 TaG3 bladder cancer. TURBT + BCG x 6  2023 TaG1 bladder + left renal pelvis cancer, failed [name] >>  08/2022 Left nephroureterectomy + RPLND. 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  06/2023 cysto w/ TURBT multifocal papillary bladder tumor recurrence (dome, left trigone, bladder neck, prostate urethral tissue) ==> TaG3 with negative muscle  Path: A. BLADDER TUMOR, TURBT:  Noninvasive high grade papillary urothelial carcinoma with inverted  growth pattern   muscularis propria (detrusor muscle) is present and not involved by  carcinoma   B. BLADDER TUMOR, NECK, BIOPSY:  Noninvasive high-grade papillary urothelial carcinoma with inverted  growth pattern  Submucosa, prostatic glands and muscularis is present and not involved  by carcinoma   C. BLADDER TUMOR, BASE, BIOPSY:  Noninvasive high grade papillary urothelial carcinoma  Muscularis propria is present and not involved by carcinoma       Malignant neoplasm of urinary bladder (HCC)  09/15/2019 Initial Diagnosis   Malignant neoplasm of urinary bladder (HCC)   08/05/2023 Cancer Staging   Staging form: Urinary Bladder, AJCC 8th Edition - Clinical: Stage 0a (rcTa, cN0, cM0) - Signed by [name], MD on 08/05/2023 Stage prefix: Recurrence WHO/ISUP grade (low/high): High Grade Histologic grading system: 2 grade system   09/16/2023 -  Chemotherapy   Patient is on Treatment Plan : BLADDER Pembrolizumab (200) q21d         REVIEW OF SYSTEMS:   Constitutional: Denies fevers, chills or abnormal weight loss Eyes: Denies blurriness  of vision Ears, nose, mouth, throat, and face: Denies mucositis or sore throat Respiratory: Denies cough, dyspnea or wheezes Cardiovascular: Denies palpitation, chest discomfort or lower extremity swelling Gastrointestinal:  Denies nausea, heartburn or change in bowel habits Skin: Denies abnormal skin rashes Lymphatics: Denies new lymphadenopathy or easy bruising Neurological:Denies numbness, tingling or new weaknesses Behavioral/Psych: Mood is stable, no new changes  All other systems were reviewed with the patient and are negative.   VITALS:  Blood pressure (!) 147/77, pulse 80, temperature 97.7 F (36.5 C), resp. rate 18, weight 166 lb 12.8 oz (75.7 kg), SpO2 100%.  Wt Readings from Last 3 Encounters:  09/10/23 166 lb 12.8 oz (75.7 kg)  09/07/23 165 lb 9.6 oz (75.1 kg)  08/24/23 165 lb 9.6 oz (75.1 kg)    Body mass index is 25.36  kg/m.  Performance status (ECOG): 1 - Symptomatic but completely ambulatory  PHYSICAL EXAM:   GENERAL:alert, no distress and comfortable SKIN: skin color normal, no rashes  EYES: normal, sclera clear LUNGS: clear to auscultation with normal breathing effort.  No wheeze or rales HEART: regular rate & rhythm and no murmurs and no lower extremity edema Musculoskeletal: no edema  LABORATORY DATA:  I have reviewed the data as listed    Component Value Date/Time   NA 139 08/06/2023 1105   K 4.5 08/06/2023 1105   CL 105 08/06/2023 1105   CO2 25 08/06/2023 1105   GLUCOSE 100 (H) 08/06/2023 1105   BUN 46 (H) 08/06/2023 1105   CREATININE 3.41 (H) 08/06/2023 1105   CALCIUM 9.6 08/06/2023 1105   PROT 7.9 08/06/2023 1105   ALBUMIN 4.4 08/06/2023 1105   AST 18 08/06/2023 1105   ALT 12 08/06/2023 1105   ALKPHOS 72 08/06/2023 1105   BILITOT 0.6 08/06/2023 1105   GFRNONAA 17 (L) 08/06/2023 1105   GFRAA 35 (L) 08/23/2020 0524    No results found for: "SPEP", "UPEP"  Lab Results  Component Value Date   WBC 7.9 08/06/2023   NEUTROABS 6.5 08/06/2023   HGB 11.0 (L) 08/06/2023   HCT 34.1 (L) 08/06/2023   MCV 93.4 08/06/2023   PLT 269 08/06/2023      Chemistry      Component Value Date/Time   NA 139 08/06/2023 1105   K 4.5 08/06/2023 1105   CL 105 08/06/2023 1105   CO2 25 08/06/2023 1105   BUN 46 (H) 08/06/2023 1105   CREATININE 3.41 (H) 08/06/2023 1105      Component Value Date/Time   CALCIUM 9.6 08/06/2023 1105   ALKPHOS 72 08/06/2023 1105   AST 18 08/06/2023 1105   ALT 12 08/06/2023 1105   BILITOT 0.6 08/06/2023 1105       RADIOGRAPHIC STUDIES: I have personally reviewed the radiological images as listed and agreed with the findings in the report. No results found.

## 2023-09-10 ENCOUNTER — Inpatient Hospital Stay: Payer: Medicare Other

## 2023-09-10 ENCOUNTER — Other Ambulatory Visit: Payer: Self-pay

## 2023-09-10 ENCOUNTER — Telehealth: Payer: Self-pay

## 2023-09-10 VITALS — BP 147/77 | HR 80 | Temp 97.7°F | Resp 18 | Wt 166.8 lb

## 2023-09-10 DIAGNOSIS — E538 Deficiency of other specified B group vitamins: Secondary | ICD-10-CM | POA: Insufficient documentation

## 2023-09-10 DIAGNOSIS — C678 Malignant neoplasm of overlapping sites of bladder: Secondary | ICD-10-CM | POA: Diagnosis not present

## 2023-09-10 DIAGNOSIS — Z5112 Encounter for antineoplastic immunotherapy: Secondary | ICD-10-CM | POA: Insufficient documentation

## 2023-09-10 DIAGNOSIS — Z79899 Other long term (current) drug therapy: Secondary | ICD-10-CM | POA: Diagnosis not present

## 2023-09-10 DIAGNOSIS — D638 Anemia in other chronic diseases classified elsewhere: Secondary | ICD-10-CM | POA: Diagnosis not present

## 2023-09-10 DIAGNOSIS — N184 Chronic kidney disease, stage 4 (severe): Secondary | ICD-10-CM | POA: Insufficient documentation

## 2023-09-10 DIAGNOSIS — L309 Dermatitis, unspecified: Secondary | ICD-10-CM | POA: Diagnosis not present

## 2023-09-10 LAB — CBC WITH DIFFERENTIAL (CANCER CENTER ONLY)
Abs Immature Granulocytes: 0.01 10*3/uL (ref 0.00–0.07)
Basophils Absolute: 0 10*3/uL (ref 0.0–0.1)
Basophils Relative: 1 %
Eosinophils Absolute: 0.3 10*3/uL (ref 0.0–0.5)
Eosinophils Relative: 4 %
HCT: 32.9 % — ABNORMAL LOW (ref 39.0–52.0)
Hemoglobin: 11 g/dL — ABNORMAL LOW (ref 13.0–17.0)
Immature Granulocytes: 0 %
Lymphocytes Relative: 9 %
Lymphs Abs: 0.7 10*3/uL (ref 0.7–4.0)
MCH: 30.4 pg (ref 26.0–34.0)
MCHC: 33.4 g/dL (ref 30.0–36.0)
MCV: 90.9 fL (ref 80.0–100.0)
Monocytes Absolute: 0.6 10*3/uL (ref 0.1–1.0)
Monocytes Relative: 8 %
Neutro Abs: 5.6 10*3/uL (ref 1.7–7.7)
Neutrophils Relative %: 78 %
Platelet Count: 272 10*3/uL (ref 150–400)
RBC: 3.62 MIL/uL — ABNORMAL LOW (ref 4.22–5.81)
RDW: 13.9 % (ref 11.5–15.5)
WBC Count: 7.2 10*3/uL (ref 4.0–10.5)
nRBC: 0 % (ref 0.0–0.2)

## 2023-09-10 LAB — CMP (CANCER CENTER ONLY)
ALT: 11 U/L (ref 0–44)
AST: 15 U/L (ref 15–41)
Albumin: 4.4 g/dL (ref 3.5–5.0)
Alkaline Phosphatase: 71 U/L (ref 38–126)
Anion gap: 8 (ref 5–15)
BUN: 47 mg/dL — ABNORMAL HIGH (ref 8–23)
CO2: 24 mmol/L (ref 22–32)
Calcium: 9.8 mg/dL (ref 8.9–10.3)
Chloride: 110 mmol/L (ref 98–111)
Creatinine: 3.12 mg/dL — ABNORMAL HIGH (ref 0.61–1.24)
GFR, Estimated: 18 mL/min — ABNORMAL LOW (ref >60)
Glucose, Bld: 104 mg/dL — ABNORMAL HIGH (ref 70–99)
Potassium: 4.2 mmol/L (ref 3.5–5.1)
Sodium: 142 mmol/L (ref 135–145)
Total Bilirubin: 0.6 mg/dL (ref 0.3–1.2)
Total Protein: 7.7 g/dL (ref 6.5–8.1)

## 2023-09-10 LAB — T4, FREE: Free T4: 0.96 ng/dL (ref 0.61–1.12)

## 2023-09-10 LAB — TSH: TSH: 2.888 u[IU]/mL (ref 0.350–4.500)

## 2023-09-10 MED ORDER — PROCHLORPERAZINE MALEATE 10 MG PO TABS: 10.0000 mg | ORAL_TABLET | Freq: Four times a day (QID) | ORAL | 1 refills | Status: DC | PRN

## 2023-09-10 MED ORDER — ONDANSETRON HCL 8 MG PO TABS: 8.0000 mg | ORAL_TABLET | Freq: Three times a day (TID) | ORAL | 1 refills | Status: DC | PRN

## 2023-09-10 MED ORDER — LIDOCAINE-PRILOCAINE 2.5-2.5 % EX CREA: TOPICAL_CREAM | CUTANEOUS | 3 refills | Status: DC

## 2023-09-10 NOTE — Assessment & Plan Note (Addendum)
Pembrolizumab order to be started about 10/24 Follow up with me every 3 weeks before treatment

## 2023-09-10 NOTE — Telephone Encounter (Signed)
Per [name] request, contacted Dr. Dorena Cookey office (nephrology) to get a record of pt's creatinine levels over the past six months. Tresa Endo states she will fax records.

## 2023-09-10 NOTE — Assessment & Plan Note (Signed)
Cr 2.67-3 in the last year Continue adequate hydration Monitor closely Continue follow-up with nephrology as well.

## 2023-09-10 NOTE — Assessment & Plan Note (Signed)
Intermittent rash. No rash.  Zyrtec once if having recurrent rash Hydrocortisone 1% cream twice for 2 weeks if recurring and call for evaluation Keep Dermatology appointment for now.

## 2023-09-10 NOTE — Assessment & Plan Note (Signed)
B12 500 mcg daily

## 2023-09-11 LAB — T4: T4, Total: 7.7 ug/dL (ref 4.5–12.0)

## 2023-09-14 ENCOUNTER — Ambulatory Visit: Payer: Medicare Other

## 2023-09-14 ENCOUNTER — Other Ambulatory Visit: Payer: Self-pay

## 2023-09-14 ENCOUNTER — Encounter: Payer: Self-pay | Admitting: Nephrology

## 2023-09-14 ENCOUNTER — Inpatient Hospital Stay: Payer: Medicare Other

## 2023-09-14 ENCOUNTER — Telehealth: Payer: Self-pay

## 2023-09-14 NOTE — Telephone Encounter (Signed)
Per Dr. [name], since pt had labs drawn on 09/10/23, he does not need labs prior to his OV and infusion appt on 09/16/23. Cancelled 1100 lab appt on 10/24 and left VM (after 2 failed attempts to reach via TC) to inform patient of this information.

## 2023-09-15 ENCOUNTER — Ambulatory Visit: Payer: Medicare Other

## 2023-09-15 VITALS — BP 134/70 | HR 74 | Temp 97.7°F | Resp 16 | Ht 68.5 in | Wt 167.8 lb

## 2023-09-15 DIAGNOSIS — N289 Disorder of kidney and ureter, unspecified: Secondary | ICD-10-CM | POA: Diagnosis not present

## 2023-09-15 DIAGNOSIS — E860 Dehydration: Secondary | ICD-10-CM

## 2023-09-15 MED ORDER — SODIUM CHLORIDE 0.9 % IV BOLUS
1000.0000 mL | Freq: Once | INTRAVENOUS | Status: AC
  Administered 2023-09-15: 1000 mL via INTRAVENOUS
  Filled 2023-09-15: qty 1000

## 2023-09-15 NOTE — Progress Notes (Unsigned)
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   I went over again the indication, efficacy, side effects of pembrolizumab with his son in the room today.  We discussed watching for potential side effects.  His rash has since resolved from last visit.   Given his abnormal renal function appears at baseline comparing to outside records. Advised him to increase fluid intake to more than 40 ounces day   Malignant neoplasm of urinary bladder (HCC) Pembrolizumab order to be started today Follow up with me every 3 weeks before treatment  Low serum vitamin B12 Continue B12 500 mcg daily   CKD (chronic kidney disease) stage 4, GFR 15-29 ml/min (HCC) Cr 2.67-3 in the last year Continue adequate hydration Monitor closely Continue follow-up with nephrology   Cysto planned for 12/31.  The patient understands the plans discussed today and is in agreement with them.  He knows to contact our office if he develops concerns prior to his next appointment.  [name], MD  [facility] [address] [place] [address] Dept: [phone] Dept Fax: [phone]   No orders of the defined types were placed in this encounter.     CHIEF COMPLAINT:  CC: NMIBC  Current Treatment:  to be pembrolizumab  INTERVAL HISTORY:  [name] [name] is here today for repeat clinical assessment. He reports no more rash.  A few facial skin moles wants Derm to look at.  I have reviewed the past medical history, past  surgical history, social history and family history with the patient and they are unchanged from previous note.  ALLERGIES:  is allergic to macrobid [nitrofurantoin], ciprofloxacin, tramadol hcl, cephalexin, and jelmyto [mitomycin].  MEDICATIONS:  Current Outpatient Medications  Medication Sig Dispense Refill   acetaminophen (TYLENOL) 500 MG tablet Take 1,000 mg by mouth every 6 (six) hours as needed for moderate pain.     amLODipine (NORVASC) 10 MG tablet Take 1 tablet (10 mg total) by mouth daily. (Patient taking differently: Take 10 mg by mouth at bedtime.) 90 tablet 1   aspirin EC 81 MG tablet Take 81 mg by mouth daily. Swallow whole.     ferrous sulfate 325 (65 FE) MG tablet Take 325 mg by mouth 2 (two) times daily with a meal.     finasteride (PROSCAR) 5 MG tablet TAKE ONE TABLET BY MOUTH DAILY (Patient taking differently: Take 5 mg by mouth at bedtime.) 90 tablet 3   levocetirizine (XYZAL) 5 MG tablet 1 tablet in the evening for 7 days then daily as needed Orally Once a day for 30 days     levothyroxine (SYNTHROID, LEVOTHROID) 50 MCG tablet Take 50 mcg by mouth daily before breakfast.     lidocaine-prilocaine (EMLA) cream Apply to affected area once 30 g 3   mirabegron ER (MYRBETRIQ) 50 MG TB24 tablet Take 50 mg by mouth at bedtime.     NON FORMULARY Pt uses a cpap nightly     pantoprazole (PROTONIX) 20 MG tablet Take 20 mg by mouth at bedtime.     psyllium (METAMUCIL) 58.6 %  powder Take 0.5 packets by mouth every evening.     rosuvastatin (CRESTOR) 5 MG tablet Take 5 mg by mouth at bedtime.     senna-docusate (SENOKOT-S) 8.6-50 MG tablet Take 1 tablet by mouth 2 (two) times daily. While taking strong pain meds to prevent constipation 10 tablet 0   silodosin (RAPAFLO) 8 MG CAPS capsule TAKE ONE CAPSULE BY MOUTH EVERY NIGHT AT BEDTIME 90 capsule 3   sodium bicarbonate 650 MG tablet 1 tablet Orally twice a day     ondansetron (ZOFRAN) 8 MG tablet Take 1 tablet (8 mg total) by mouth every  8 (eight) hours as needed for nausea or vomiting. (Patient not taking: Reported on 09/16/2023) 30 tablet 1   oxyCODONE (ROXICODONE) 5 MG immediate release tablet Take 1 tablet (5 mg total) by mouth every 6 (six) hours as needed for moderate pain or severe pain (Post-operatively). (Patient not taking: Reported on 09/16/2023) 10 tablet 0   prochlorperazine (COMPAZINE) 10 MG tablet Take 1 tablet (10 mg total) by mouth every 6 (six) hours as needed for nausea or vomiting. (Patient not taking: Reported on 09/16/2023) 30 tablet 1   No current facility-administered medications for this visit.    HISTORY OF PRESENT ILLNESS:   Oncology History Overview Note  2000 BPH with elevated PSA. Report biopsy was benign.   2011 to now with multiple TURBT   2021 TaG3 bladder cancer. TURBT + BCG x 6  2023 TaG1 bladder + left renal pelvis cancer, failed [name] >>  08/2022 Left nephroureterectomy + RPLND. 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  06/2023 cysto w/ TURBT multifocal papillary bladder tumor recurrence (dome, left trigone, bladder neck, prostate urethral tissue) ==> TaG3 with negative muscle  Path: A. BLADDER TUMOR, TURBT:  Noninvasive high grade papillary urothelial carcinoma with inverted  growth pattern  muscularis propria (detrusor muscle) is present and not involved by  carcinoma   B. BLADDER TUMOR, NECK, BIOPSY:  Noninvasive high-grade papillary urothelial carcinoma with inverted  growth pattern  Submucosa, prostatic glands and muscularis is present and not involved  by carcinoma   C. BLADDER TUMOR, BASE, BIOPSY:   Noninvasive high grade papillary urothelial carcinoma  Muscularis propria is present and not involved by carcinoma       Malignant neoplasm of urinary bladder (HCC)  09/15/2019 Initial Diagnosis   Malignant neoplasm of urinary bladder (HCC)   08/05/2023 Cancer Staging   Staging form: Urinary Bladder, AJCC 8th Edition - Clinical: Stage 0a (rcTa, cN0, cM0) - Signed by [name], MD on 08/05/2023 Stage prefix: Recurrence WHO/ISUP grade (low/high): High Grade Histologic grading system: 2 grade system   09/16/2023 -  Chemotherapy   Patient is on Treatment Plan : BLADDER Pembrolizumab (200) q21d         REVIEW OF SYSTEMS:   Respiratory: Denies cough, dyspnea or wheezes Cardiovascular: Denies palpitation, chest discomfort or lower extremity swelling Gastrointestinal:  Denies abdominal pain, diarrhea. Skin: Denies abnormal skin rashes GU: urinating fine with good volume. No bloody All other systems were reviewed with the patient and are negative.   VITALS:  Blood pressure 128/61, pulse 65, temperature (!) 97.5 F (  36.4 C), resp. rate 16, weight 168 lb 9.6 oz (76.5 kg), SpO2 99%.  Wt Readings from Last 3 Encounters:  09/16/23 168 lb 9.6 oz (76.5 kg)  09/15/23 167 lb 12.8 oz (76.1 kg)  09/10/23 166 lb 12.8 oz (75.7 kg)    Body mass index is 25.26 kg/m.  Performance status (ECOG): 1 - Symptomatic but completely ambulatory  PHYSICAL EXAM:   GENERAL:alert, no distress and comfortable SKIN: skin color normal, no rashes  NECK: supple,  non-tender, without nodularity LUNGS: clear to auscultation with normal breathing effort.  No wheeze or rales HEART: regular rate & rhythm and no murmurs and no lower extremity edema ABDOMEN: abdomen soft, non-tender and nondistended Musculoskeletal: no edema  LABORATORY DATA:  I have reviewed the data as listed    Component Value Date/Time   NA 142 09/10/2023 1132   K 4.2 09/10/2023 1132   CL 110 09/10/2023 1132   CO2 24  09/10/2023 1132   GLUCOSE 104 (H) 09/10/2023 1132   BUN 47 (H) 09/10/2023 1132   CREATININE 3.12 (H) 09/10/2023 1132   CALCIUM 9.8 09/10/2023 1132   PROT 7.7 09/10/2023 1132   ALBUMIN 4.4 09/10/2023 1132   AST 15 09/10/2023 1132   ALT 11 09/10/2023 1132   ALKPHOS 71 09/10/2023 1132   BILITOT 0.6 09/10/2023 1132   GFRNONAA 18 (L) 09/10/2023 1132   GFRAA 35 (L) 08/23/2020 0524    No results found for: "SPEP", "UPEP"  Lab Results  Component Value Date   WBC 7.2 09/10/2023   NEUTROABS 5.6 09/10/2023   HGB 11.0 (L) 09/10/2023   HCT 32.9 (L) 09/10/2023   MCV 90.9 09/10/2023   PLT 272 09/10/2023      Chemistry      Component Value Date/Time   NA 142 09/10/2023 1132   K 4.2 09/10/2023 1132   CL 110 09/10/2023 1132   CO2 24 09/10/2023 1132   BUN 47 (H) 09/10/2023 1132   CREATININE 3.12 (H) 09/10/2023 1132      Component Value Date/Time   CALCIUM 9.8 09/10/2023 1132   ALKPHOS 71 09/10/2023 1132   AST 15 09/10/2023 1132   ALT 11 09/10/2023 1132   BILITOT 0.6 09/10/2023 1132       RADIOGRAPHIC STUDIES: I have personally reviewed the radiological images as listed and agreed with the findings in the report. No results found.

## 2023-09-15 NOTE — Assessment & Plan Note (Signed)
Cr 2.67-3 in the last year Continue adequate hydration Monitor closely Continue follow-up with nephrology

## 2023-09-15 NOTE — Assessment & Plan Note (Signed)
Continue B12 500 mcg daily

## 2023-09-15 NOTE — Assessment & Plan Note (Signed)
Pembrolizumab order to be started today Follow up with me every 3 weeks before treatment

## 2023-09-15 NOTE — Progress Notes (Signed)
Diagnosis: Renal insufficiency,Dehydration  Provider:  Chilton Greathouse MD  Procedure: IV Infusion  IV Type: Peripheral, IV Location: L Forearm  Normal Saline, Orencia (Abatacept), Dose: 1000 ml  Infusion Start Time: 1325  Infusion Stop Time: 1546  Post Infusion IV Care: Peripheral IV Discontinued  Discharge: Condition: Good, Destination: Home . AVS Declined  Performed by:  Garnette Czech, RN

## 2023-09-16 ENCOUNTER — Other Ambulatory Visit: Payer: Self-pay

## 2023-09-16 ENCOUNTER — Inpatient Hospital Stay (HOSPITAL_BASED_OUTPATIENT_CLINIC_OR_DEPARTMENT_OTHER): Payer: Medicare Other

## 2023-09-16 ENCOUNTER — Ambulatory Visit: Payer: Medicare Other

## 2023-09-16 ENCOUNTER — Other Ambulatory Visit: Payer: Medicare Other

## 2023-09-16 VITALS — BP 128/61 | HR 65 | Temp 97.5°F | Resp 16 | Wt 168.6 lb

## 2023-09-16 DIAGNOSIS — C678 Malignant neoplasm of overlapping sites of bladder: Secondary | ICD-10-CM

## 2023-09-16 DIAGNOSIS — E860 Dehydration: Secondary | ICD-10-CM

## 2023-09-16 DIAGNOSIS — Z79899 Other long term (current) drug therapy: Secondary | ICD-10-CM | POA: Diagnosis not present

## 2023-09-16 DIAGNOSIS — E538 Deficiency of other specified B group vitamins: Secondary | ICD-10-CM

## 2023-09-16 DIAGNOSIS — N184 Chronic kidney disease, stage 4 (severe): Secondary | ICD-10-CM | POA: Diagnosis not present

## 2023-09-16 DIAGNOSIS — L309 Dermatitis, unspecified: Secondary | ICD-10-CM | POA: Diagnosis not present

## 2023-09-16 DIAGNOSIS — Z5112 Encounter for antineoplastic immunotherapy: Secondary | ICD-10-CM | POA: Diagnosis not present

## 2023-09-16 MED ORDER — SODIUM CHLORIDE 0.9 % IV SOLN: Freq: Once | INTRAVENOUS | Status: AC

## 2023-09-16 MED ORDER — SODIUM CHLORIDE 0.9 % IV SOLN
200.0000 mg | Freq: Once | INTRAVENOUS | Status: AC
  Administered 2023-09-16: 200 mg via INTRAVENOUS
  Filled 2023-09-16: qty 200

## 2023-09-16 NOTE — Addendum Note (Signed)
Addended by: Geanie Berlin on: 09/16/2023 12:44 PM   Modules accepted: Orders

## 2023-09-16 NOTE — Progress Notes (Signed)
Per Dr. Anner Crete ok for treatment today with elevated serum creatine from labs 09/10/23.

## 2023-09-16 NOTE — Patient Instructions (Signed)
[place] [facility]  Discharge Instructions: Thank you for choosing [facility] to provide your oncology and hematology care.   If you have a lab appointment with the Cancer Center, please go directly to the Cancer Center and check in at the registration area.   Wear comfortable clothing and clothing appropriate for easy access to any Portacath or PICC line.   We strive to give you quality time with your provider. You may need to reschedule your appointment if you arrive late (15 or more minutes).  Arriving late affects you and other patients whose appointments are after yours.  Also, if you miss three or more appointments without notifying the office, you may be dismissed from the clinic at the provider's discretion.      For prescription refill requests, have your pharmacy contact our office and allow 72 hours for refills to be completed.    Today you received the following chemotherapy and/or immunotherapy agent: Pembrolizumab (Keytruda)   To help prevent nausea and vomiting after your treatment, we encourage you to take your nausea medication as directed.  BELOW ARE SYMPTOMS THAT SHOULD BE REPORTED IMMEDIATELY: *FEVER GREATER THAN 100.4 F (38 C) OR HIGHER *CHILLS OR SWEATING *NAUSEA AND VOMITING THAT IS NOT CONTROLLED WITH YOUR NAUSEA MEDICATION *UNUSUAL SHORTNESS OF BREATH *UNUSUAL BRUISING OR BLEEDING *URINARY PROBLEMS (pain or burning when urinating, or frequent urination) *BOWEL PROBLEMS (unusual diarrhea, constipation, pain near the anus) TENDERNESS IN MOUTH AND THROAT WITH OR WITHOUT PRESENCE OF ULCERS (sore throat, sores in mouth, or a toothache) UNUSUAL RASH, SWELLING OR PAIN  UNUSUAL VAGINAL DISCHARGE OR ITCHING   Items with * indicate a potential emergency and should be followed up as soon as possible or go to the Emergency Department if any problems should occur.  Please show the CHEMOTHERAPY ALERT CARD or IMMUNOTHERAPY ALERT  CARD at check-in to the Emergency Department and triage nurse.  Should you have questions after your visit or need to cancel or reschedule your appointment, please contact [place] [facility]  Dept: [phone]  and follow the prompts.  Office hours are 8:00 a.m. to 4:30 p.m. Monday - Friday. Please note that voicemails left after 4:00 p.m. may not be returned until the following business day.  We are closed weekends and major holidays. You have access to a nurse at all times for urgent questions. Please call the main number to the clinic Dept: [phone] and follow the prompts.   For any non-urgent questions, you may also contact your provider using MyChart. We now offer e-Visits for anyone 18 and older to request care online for non-urgent symptoms. For details visit [email or web address].   Also download the MyChart app! Go to the app store, search "MyChart", open the app, select [place], and log in with your MyChart username and password.  Pembrolizumab Injection What is this medication? PEMBROLIZUMAB (PEM broe LIZ ue mab) treats some types of cancer. It works by helping your immune system slow or stop the spread of cancer cells. It is a monoclonal antibody. This medicine may be used for other purposes; ask your health care provider or pharmacist if you have questions. COMMON BRAND NAME(S): Keytruda What should I tell my care team before I take this medication? They need to know if you have any of these conditions: Allogeneic stem cell transplant (uses someone else's stem cells) Autoimmune diseases, such as Crohn disease, ulcerative colitis, lupus History of chest radiation Nervous system problems,   such as Guillain-Barre syndrome, myasthenia gravis Organ transplant An unusual or allergic reaction to pembrolizumab, other medications, foods, dyes, or preservatives Pregnant or trying to get pregnant Breast-feeding How should I use this medication? This  medication is injected into a vein. It is given by your care team in a hospital or clinic setting. A special MedGuide will be given to you before each treatment. Be sure to read this information carefully each time. Talk to your care team about the use of this medication in children. While it may be prescribed for children as young as 6 months for selected conditions, precautions do apply. Overdosage: If you think you have taken too much of this medicine contact a poison control center or emergency room at once. NOTE: This medicine is only for you. Do not share this medicine with others. What if I miss a dose? Keep appointments for follow-up doses. It is important not to miss your dose. Call your care team if you are unable to keep an appointment. What may interact with this medication? Interactions have not been studied. This list may not describe all possible interactions. Give your health care provider a list of all the medicines, herbs, non-prescription drugs, or dietary supplements you use. Also tell them if you smoke, drink alcohol, or use illegal drugs. Some items may interact with your medicine. What should I watch for while using this medication? Your condition will be monitored carefully while you are receiving this medication. You may need blood work while taking this medication. This medication may cause serious skin reactions. They can happen weeks to months after starting the medication. Contact your care team right away if you notice fevers or flu-like symptoms with a rash. The rash may be red or purple and then turn into blisters or peeling of the skin. You may also notice a red rash with swelling of the face, lips, or lymph nodes in your neck or under your arms. Tell your care team right away if you have any change in your eyesight. Talk to your care team if you may be pregnant. Serious birth defects can occur if you take this medication during pregnancy and for 4 months after the last  dose. You will need a negative pregnancy test before starting this medication. Contraception is recommended while taking this medication and for 4 months after the last dose. Your care team can help you find the option that works for you. Do not breastfeed while taking this medication and for 4 months after the last dose. What side effects may I notice from receiving this medication? Side effects that you should report to your care team as soon as possible: Allergic reactions--skin rash, itching, hives, swelling of the face, lips, tongue, or throat Dry cough, shortness of breath or trouble breathing Eye pain, redness, irritation, or discharge with blurry or decreased vision Heart muscle inflammation--unusual weakness or fatigue, shortness of breath, chest pain, fast or irregular heartbeat, dizziness, swelling of the ankles, feet, or hands Hormone gland problems--headache, sensitivity to light, unusual weakness or fatigue, dizziness, fast or irregular heartbeat, increased sensitivity to cold or heat, excessive sweating, constipation, hair loss, increased thirst or amount of urine, tremors or shaking, irritability Infusion reactions--chest pain, shortness of breath or trouble breathing, feeling faint or lightheaded Kidney injury (glomerulonephritis)--decrease in the amount of urine, red or dark brown urine, foamy or bubbly urine, swelling of the ankles, hands, or feet Liver injury--right upper belly pain, loss of appetite, nausea, light-colored stool, dark yellow or brown urine,   yellowing skin or eyes, unusual weakness or fatigue Pain, tingling, or numbness in the hands or feet, muscle weakness, change in vision, confusion or trouble speaking, loss of balance or coordination, trouble walking, seizures Rash, fever, and swollen lymph nodes Redness, blistering, peeling, or loosening of the skin, including inside the mouth Sudden or severe stomach pain, bloody diarrhea, fever, nausea, vomiting Side effects  that usually do not require medical attention (report to your care team if they continue or are bothersome): Bone, joint, or muscle pain Diarrhea Fatigue Loss of appetite Nausea Skin rash This list may not describe all possible side effects. Call your doctor for medical advice about side effects. You may report side effects to FDA at 1-800-FDA-1088. Where should I keep my medication? This medication is given in a hospital or clinic. It will not be stored at home. NOTE: This sheet is a summary. It may not cover all possible information. If you have questions about this medicine, talk to your doctor, pharmacist, or health care provider.  2024 Elsevier/Gold Standard (2022-03-24 00:00:00)   

## 2023-09-17 ENCOUNTER — Telehealth: Payer: Self-pay

## 2023-09-17 NOTE — Telephone Encounter (Signed)
Mr. [name] [name] states that he is doing fine. He is eating, drinking, and urinating well. He knows to call the office at [phone] if he has any questions or concerns.

## 2023-09-17 NOTE — Telephone Encounter (Signed)
-----   Message from Nurse Dillard Essex sent at 09/16/2023  2:39 PM EDT ----- Regarding: Dr. Anner Crete Pt. first time Keytruda. Tolerated well, no issues noted.

## 2023-09-20 NOTE — Addendum Note (Signed)
Addended by: Geanie Berlin on: 09/20/2023 01:46 PM   Modules accepted: Orders

## 2023-09-21 ENCOUNTER — Other Ambulatory Visit: Payer: Self-pay

## 2023-09-22 ENCOUNTER — Ambulatory Visit (INDEPENDENT_AMBULATORY_CARE_PROVIDER_SITE_OTHER): Payer: Medicare Other

## 2023-09-22 VITALS — BP 143/71 | HR 76 | Temp 97.5°F | Resp 20 | Ht 68.5 in | Wt 166.0 lb

## 2023-09-22 DIAGNOSIS — E860 Dehydration: Secondary | ICD-10-CM

## 2023-09-22 DIAGNOSIS — N289 Disorder of kidney and ureter, unspecified: Secondary | ICD-10-CM

## 2023-09-22 MED ORDER — SODIUM CHLORIDE 0.9 % IV BOLUS
1000.0000 mL | Freq: Once | INTRAVENOUS | Status: AC
  Administered 2023-09-22: 1000 mL via INTRAVENOUS

## 2023-09-22 NOTE — Progress Notes (Signed)
Diagnosis: Renal Insufficiency  Provider:  Chilton Greathouse MD  Procedure: IV Infusion  IV Type: Peripheral, IV Location: R Forearm  Normal Saline, Orencia (Abatacept), Dose: 1000 ml  Infusion Start Time: 1127  Infusion Stop Time: 1335  Post Infusion IV Care: Peripheral IV Discontinued  Discharge: Condition: Good, Destination: Home . AVS Declined  Performed by:  Garnette Czech, RN

## 2023-09-23 DIAGNOSIS — L820 Inflamed seborrheic keratosis: Secondary | ICD-10-CM | POA: Diagnosis not present

## 2023-09-23 DIAGNOSIS — D485 Neoplasm of uncertain behavior of skin: Secondary | ICD-10-CM | POA: Diagnosis not present

## 2023-09-23 DIAGNOSIS — L821 Other seborrheic keratosis: Secondary | ICD-10-CM | POA: Diagnosis not present

## 2023-09-23 DIAGNOSIS — L570 Actinic keratosis: Secondary | ICD-10-CM | POA: Diagnosis not present

## 2023-09-23 DIAGNOSIS — D225 Melanocytic nevi of trunk: Secondary | ICD-10-CM | POA: Diagnosis not present

## 2023-09-23 DIAGNOSIS — L2989 Other pruritus: Secondary | ICD-10-CM | POA: Diagnosis not present

## 2023-09-23 DIAGNOSIS — L814 Other melanin hyperpigmentation: Secondary | ICD-10-CM | POA: Diagnosis not present

## 2023-09-23 DIAGNOSIS — Z789 Other specified health status: Secondary | ICD-10-CM | POA: Diagnosis not present

## 2023-09-23 DIAGNOSIS — C4401 Basal cell carcinoma of skin of lip: Secondary | ICD-10-CM | POA: Diagnosis not present

## 2023-09-23 DIAGNOSIS — L538 Other specified erythematous conditions: Secondary | ICD-10-CM | POA: Diagnosis not present

## 2023-09-23 DIAGNOSIS — C44319 Basal cell carcinoma of skin of other parts of face: Secondary | ICD-10-CM | POA: Diagnosis not present

## 2023-09-24 DIAGNOSIS — N184 Chronic kidney disease, stage 4 (severe): Secondary | ICD-10-CM | POA: Diagnosis not present

## 2023-09-27 DIAGNOSIS — R310 Gross hematuria: Secondary | ICD-10-CM | POA: Diagnosis not present

## 2023-09-27 DIAGNOSIS — C678 Malignant neoplasm of overlapping sites of bladder: Secondary | ICD-10-CM | POA: Diagnosis not present

## 2023-09-28 ENCOUNTER — Encounter (HOSPITAL_COMMUNITY): Payer: Self-pay

## 2023-09-28 ENCOUNTER — Emergency Department (HOSPITAL_COMMUNITY): Payer: Medicare Other

## 2023-09-28 ENCOUNTER — Other Ambulatory Visit: Payer: Self-pay

## 2023-09-28 ENCOUNTER — Inpatient Hospital Stay (HOSPITAL_COMMUNITY)
Admission: EM | Admit: 2023-09-28 | Discharge: 2023-10-02 | DRG: 669 | Disposition: A | Discharge status: Home / Self Care | Payer: Medicare Other | Attending: Internal Medicine | Admitting: Internal Medicine

## 2023-09-28 DIAGNOSIS — I700 Atherosclerosis of aorta: Secondary | ICD-10-CM | POA: Insufficient documentation

## 2023-09-28 DIAGNOSIS — Z905 Acquired absence of kidney: Secondary | ICD-10-CM

## 2023-09-28 DIAGNOSIS — J323 Chronic sphenoidal sinusitis: Secondary | ICD-10-CM | POA: Insufficient documentation

## 2023-09-28 DIAGNOSIS — N200 Calculus of kidney: Secondary | ICD-10-CM | POA: Diagnosis not present

## 2023-09-28 DIAGNOSIS — J45909 Unspecified asthma, uncomplicated: Secondary | ICD-10-CM | POA: Diagnosis not present

## 2023-09-28 DIAGNOSIS — D62 Acute posthemorrhagic anemia: Secondary | ICD-10-CM | POA: Diagnosis present

## 2023-09-28 DIAGNOSIS — K5730 Diverticulosis of large intestine without perforation or abscess without bleeding: Secondary | ICD-10-CM | POA: Diagnosis present

## 2023-09-28 DIAGNOSIS — Z885 Allergy status to narcotic agent status: Secondary | ICD-10-CM

## 2023-09-28 DIAGNOSIS — Z79899 Other long term (current) drug therapy: Secondary | ICD-10-CM | POA: Diagnosis not present

## 2023-09-28 DIAGNOSIS — Z8553 Personal history of malignant neoplasm of renal pelvis: Secondary | ICD-10-CM

## 2023-09-28 DIAGNOSIS — I10 Essential (primary) hypertension: Secondary | ICD-10-CM | POA: Diagnosis not present

## 2023-09-28 DIAGNOSIS — Z860100 Personal history of colon polyps, unspecified: Secondary | ICD-10-CM

## 2023-09-28 DIAGNOSIS — N3289 Other specified disorders of bladder: Secondary | ICD-10-CM | POA: Insufficient documentation

## 2023-09-28 DIAGNOSIS — I129 Hypertensive chronic kidney disease with stage 1 through stage 4 chronic kidney disease, or unspecified chronic kidney disease: Secondary | ICD-10-CM | POA: Diagnosis present

## 2023-09-28 DIAGNOSIS — C679 Malignant neoplasm of bladder, unspecified: Secondary | ICD-10-CM | POA: Diagnosis not present

## 2023-09-28 DIAGNOSIS — N184 Chronic kidney disease, stage 4 (severe): Secondary | ICD-10-CM | POA: Diagnosis not present

## 2023-09-28 DIAGNOSIS — M1990 Unspecified osteoarthritis, unspecified site: Secondary | ICD-10-CM | POA: Insufficient documentation

## 2023-09-28 DIAGNOSIS — C678 Malignant neoplasm of overlapping sites of bladder: Secondary | ICD-10-CM | POA: Diagnosis not present

## 2023-09-28 DIAGNOSIS — R339 Retention of urine, unspecified: Secondary | ICD-10-CM | POA: Diagnosis not present

## 2023-09-28 DIAGNOSIS — R7303 Prediabetes: Secondary | ICD-10-CM | POA: Diagnosis present

## 2023-09-28 DIAGNOSIS — R630 Anorexia: Secondary | ICD-10-CM | POA: Insufficient documentation

## 2023-09-28 DIAGNOSIS — R338 Other retention of urine: Secondary | ICD-10-CM | POA: Diagnosis present

## 2023-09-28 DIAGNOSIS — C689 Malignant neoplasm of urinary organ, unspecified: Secondary | ICD-10-CM | POA: Insufficient documentation

## 2023-09-28 DIAGNOSIS — C662 Malignant neoplasm of left ureter: Secondary | ICD-10-CM | POA: Diagnosis not present

## 2023-09-28 DIAGNOSIS — Z79890 Hormone replacement therapy (postmenopausal): Secondary | ICD-10-CM

## 2023-09-28 DIAGNOSIS — Z881 Allergy status to other antibiotic agents status: Secondary | ICD-10-CM | POA: Diagnosis not present

## 2023-09-28 DIAGNOSIS — R319 Hematuria, unspecified: Secondary | ICD-10-CM | POA: Diagnosis not present

## 2023-09-28 DIAGNOSIS — F410 Panic disorder [episodic paroxysmal anxiety] without agoraphobia: Secondary | ICD-10-CM | POA: Insufficient documentation

## 2023-09-28 DIAGNOSIS — N189 Chronic kidney disease, unspecified: Secondary | ICD-10-CM | POA: Diagnosis not present

## 2023-09-28 DIAGNOSIS — Z888 Allergy status to other drugs, medicaments and biological substances status: Secondary | ICD-10-CM

## 2023-09-28 DIAGNOSIS — Z9079 Acquired absence of other genital organ(s): Secondary | ICD-10-CM

## 2023-09-28 DIAGNOSIS — R600 Localized edema: Secondary | ICD-10-CM | POA: Insufficient documentation

## 2023-09-28 DIAGNOSIS — Z7982 Long term (current) use of aspirin: Secondary | ICD-10-CM

## 2023-09-28 DIAGNOSIS — H9190 Unspecified hearing loss, unspecified ear: Secondary | ICD-10-CM | POA: Diagnosis present

## 2023-09-28 DIAGNOSIS — Z820 Family history of epilepsy and other diseases of the nervous system: Secondary | ICD-10-CM

## 2023-09-28 DIAGNOSIS — Z87440 Personal history of urinary (tract) infections: Secondary | ICD-10-CM

## 2023-09-28 DIAGNOSIS — Z8249 Family history of ischemic heart disease and other diseases of the circulatory system: Secondary | ICD-10-CM | POA: Diagnosis not present

## 2023-09-28 DIAGNOSIS — E782 Mixed hyperlipidemia: Secondary | ICD-10-CM | POA: Diagnosis present

## 2023-09-28 DIAGNOSIS — R310 Gross hematuria: Secondary | ICD-10-CM | POA: Diagnosis not present

## 2023-09-28 DIAGNOSIS — Z9841 Cataract extraction status, right eye: Secondary | ICD-10-CM

## 2023-09-28 DIAGNOSIS — K219 Gastro-esophageal reflux disease without esophagitis: Secondary | ICD-10-CM | POA: Diagnosis present

## 2023-09-28 DIAGNOSIS — Z9842 Cataract extraction status, left eye: Secondary | ICD-10-CM

## 2023-09-28 DIAGNOSIS — Z825 Family history of asthma and other chronic lower respiratory diseases: Secondary | ICD-10-CM

## 2023-09-28 DIAGNOSIS — Z803 Family history of malignant neoplasm of breast: Secondary | ICD-10-CM

## 2023-09-28 DIAGNOSIS — R58 Hemorrhage, not elsewhere classified: Secondary | ICD-10-CM | POA: Diagnosis not present

## 2023-09-28 DIAGNOSIS — G4730 Sleep apnea, unspecified: Secondary | ICD-10-CM | POA: Diagnosis present

## 2023-09-28 DIAGNOSIS — Z85828 Personal history of other malignant neoplasm of skin: Secondary | ICD-10-CM | POA: Diagnosis not present

## 2023-09-28 DIAGNOSIS — E039 Hypothyroidism, unspecified: Secondary | ICD-10-CM | POA: Diagnosis not present

## 2023-09-28 DIAGNOSIS — N401 Enlarged prostate with lower urinary tract symptoms: Secondary | ICD-10-CM | POA: Diagnosis present

## 2023-09-28 HISTORY — DX: Other specified disorders of bladder: N32.89

## 2023-09-28 LAB — URINALYSIS, ROUTINE W REFLEX MICROSCOPIC

## 2023-09-28 LAB — BASIC METABOLIC PANEL
Anion gap: 10 (ref 5–15)
BUN: 55 mg/dL — ABNORMAL HIGH (ref 8–23)
CO2: 21 mmol/L — ABNORMAL LOW (ref 22–32)
Calcium: 9.3 mg/dL (ref 8.9–10.3)
Chloride: 107 mmol/L (ref 98–111)
Creatinine, Ser: 2.97 mg/dL — ABNORMAL HIGH (ref 0.61–1.24)
GFR, Estimated: 19 mL/min — ABNORMAL LOW (ref >60)
Glucose, Bld: 104 mg/dL — ABNORMAL HIGH (ref 70–99)
Potassium: 4.1 mmol/L (ref 3.5–5.1)
Sodium: 138 mmol/L (ref 135–145)

## 2023-09-28 LAB — CBC WITH DIFFERENTIAL/PLATELET
Abs Immature Granulocytes: 0.01 10*3/uL (ref 0.00–0.07)
Basophils Absolute: 0.1 10*3/uL (ref 0.0–0.1)
Basophils Relative: 1 %
Eosinophils Absolute: 0.2 10*3/uL (ref 0.0–0.5)
Eosinophils Relative: 3 %
HCT: 32.4 % — ABNORMAL LOW (ref 39.0–52.0)
Hemoglobin: 10.8 g/dL — ABNORMAL LOW (ref 13.0–17.0)
Immature Granulocytes: 0 %
Lymphocytes Relative: 6 %
Lymphs Abs: 0.4 10*3/uL — ABNORMAL LOW (ref 0.7–4.0)
MCH: 30.4 pg (ref 26.0–34.0)
MCHC: 33.3 g/dL (ref 30.0–36.0)
MCV: 91.3 fL (ref 80.0–100.0)
Monocytes Absolute: 0.5 10*3/uL (ref 0.1–1.0)
Monocytes Relative: 7 %
Neutro Abs: 6 10*3/uL (ref 1.7–7.7)
Neutrophils Relative %: 83 %
Platelets: 257 10*3/uL (ref 150–400)
RBC: 3.55 MIL/uL — ABNORMAL LOW (ref 4.22–5.81)
RDW: 14.2 % (ref 11.5–15.5)
WBC: 7.2 10*3/uL (ref 4.0–10.5)
nRBC: 0 % (ref 0.0–0.2)

## 2023-09-28 LAB — URINALYSIS, MICROSCOPIC (REFLEX): RBC / HPF: >50 RBC/hpf (ref 0–5)

## 2023-09-28 LAB — HEMOGLOBIN AND HEMATOCRIT, BLOOD
HCT: 33.2 % — ABNORMAL LOW (ref 39.0–52.0)
HCT: 29.9 % — ABNORMAL LOW (ref 39.0–52.0)
Hemoglobin: 11 g/dL — ABNORMAL LOW (ref 13.0–17.0)
Hemoglobin: 9.7 g/dL — ABNORMAL LOW (ref 13.0–17.0)

## 2023-09-28 LAB — TYPE AND SCREEN
ABO/RH(D): A POS
Antibody Screen: NEGATIVE

## 2023-09-28 MED ORDER — FERROUS SULFATE 325 (65 FE) MG PO TABS
325.0000 mg | ORAL_TABLET | Freq: Two times a day (BID) | ORAL | Status: DC
  Administered 2023-09-28 – 2023-10-02 (×7): 325 mg via ORAL
  Filled 2023-09-28 (×7): qty 1

## 2023-09-28 MED ORDER — SODIUM BICARBONATE 650 MG PO TABS
650.0000 mg | ORAL_TABLET | Freq: Two times a day (BID) | ORAL | Status: DC
  Administered 2023-09-28 – 2023-10-02 (×8): 650 mg via ORAL
  Filled 2023-09-28 (×8): qty 1

## 2023-09-28 MED ORDER — TAMSULOSIN HCL 0.4 MG PO CAPS
0.4000 mg | ORAL_CAPSULE | Freq: Every day | ORAL | Status: DC
  Administered 2023-09-28 – 2023-10-01 (×4): 0.4 mg via ORAL
  Filled 2023-09-28 (×4): qty 1

## 2023-09-28 MED ORDER — ONDANSETRON HCL 4 MG PO TABS: 4.0000 mg | ORAL_TABLET | Freq: Four times a day (QID) | ORAL | Status: DC | PRN

## 2023-09-28 MED ORDER — FINASTERIDE 5 MG PO TABS
5.0000 mg | ORAL_TABLET | Freq: Every day | ORAL | Status: DC
  Administered 2023-09-28 – 2023-10-01 (×4): 5 mg via ORAL
  Filled 2023-09-28 (×4): qty 1

## 2023-09-28 MED ORDER — ONDANSETRON HCL 4 MG/2ML IJ SOLN
4.0000 mg | Freq: Four times a day (QID) | INTRAMUSCULAR | Status: DC | PRN
  Administered 2023-10-01: 4 mg via INTRAVENOUS

## 2023-09-28 MED ORDER — AMLODIPINE BESYLATE 10 MG PO TABS
10.0000 mg | ORAL_TABLET | Freq: Every day | ORAL | Status: DC
  Administered 2023-09-28 – 2023-10-01 (×4): 10 mg via ORAL
  Filled 2023-09-28: qty 1
  Filled 2023-09-28 (×3): qty 2

## 2023-09-28 MED ORDER — PANTOPRAZOLE SODIUM 20 MG PO TBEC
20.0000 mg | DELAYED_RELEASE_TABLET | Freq: Every day | ORAL | Status: DC
  Administered 2023-09-28 – 2023-10-01 (×4): 20 mg via ORAL
  Filled 2023-09-28 (×4): qty 1

## 2023-09-28 MED ORDER — ACETAMINOPHEN 325 MG PO TABS
650.0000 mg | ORAL_TABLET | Freq: Four times a day (QID) | ORAL | Status: DC | PRN
  Administered 2023-09-28 – 2023-10-01 (×3): 650 mg via ORAL
  Filled 2023-09-28 (×3): qty 2

## 2023-09-28 MED ORDER — ACETAMINOPHEN 650 MG RE SUPP: 650.0000 mg | Freq: Four times a day (QID) | RECTAL | Status: DC | PRN

## 2023-09-28 MED ORDER — LEVOTHYROXINE SODIUM 50 MCG PO TABS
50.0000 ug | ORAL_TABLET | Freq: Every day | ORAL | Status: DC
Start: 2023-09-29 — End: 2023-10-02
  Administered 2023-09-30 – 2023-10-02 (×3): 50 ug via ORAL
  Filled 2023-09-28 (×4): qty 1

## 2023-09-28 MED ORDER — ROSUVASTATIN CALCIUM 5 MG PO TABS
5.0000 mg | ORAL_TABLET | Freq: Every day | ORAL | Status: DC
Start: 2023-09-28 — End: 2023-10-02
  Administered 2023-09-28 – 2023-10-01 (×4): 5 mg via ORAL
  Filled 2023-09-28 (×4): qty 1

## 2023-09-28 NOTE — H&P (Signed)
History and Physical    Patient: [name] [name] KGM:[id number] DOB: 1932-04-01 DOA: 09/28/2023 DOS: the patient was seen and examined on 09/28/2023 PCP: [name], MD  Patient coming from: Home  Chief Complaint:  Chief Complaint  Patient presents with   Hematuria   HPI: [name] [name] is a 89 y.o. male with medical history significant of anemia, osteoarthritis, asthma, bladder cancer, multiple episodes of hematuria, status post TURP, BPH, stage IV CKD, dyspnea, fatigue, GERD, hyperlipidemia, impaired hearing, hypertension, hypothyroidism, pigmented basal cell carcinoma, prediabetes, history of UTI who presented to the emergency department with complaints of gross hematuria since early morning and urinary retention since around 0700.  No flank pain.  He denied fever, chills, rhinorrhea, sore throat, wheezing or hemoptysis.  No chest pain, palpitations, diaphoresis, PND, orthopnea or pitting edema of the lower extremities.  No abdominal pain, nausea, emesis, diarrhea, constipation, melena or hematochezia.  No polyuria, polydipsia, polyphagia or blurred vision.   Lab work: Urinalysis was extremely red and turbid with unreliable results due to pigment interference.  CBC showed a white count of 7.2, hemoglobin 10.8 g/dL platelets 644.  BMP showed CO2 of 21 mmol/L with a normal anion gap, the rest of the electrolytes are normal.  Glucose 104, BUN 55 creatinine 2.97 mg/dL.  His creatinine level is around baseline.  Imaging: CT renal study shows status post left nephrectomy.  No evidence of recurrence identified within the limits of a noncontrast examination.  Punctate nonobstructing 2 mm stone in the inferior pole of the right kidney.  No hydronephrosis.  Similar prominent left periaortic nodes, nonspecific.  Colon diverticulosis without diverticulitis.   ED course: Initial vital signs were temperature 98 F, pulse 87, respiration 18, BP 159/85 mmHg O2 sat 100% on room air.  Urology  placed a three-way hematuria catheter for CBI irrigation.  Review of Systems: As mentioned in the history of present illness. All other systems reviewed and are negative. Past Medical History:  Diagnosis Date   Anemia    Arthritis    lower back bulging disc, hips, knees, thumbs, shoulder   Asthma    as a pre teen   Bladder cancer (HCC) UROLOGIST-  DR MCKENZIE   S/P TURBT 08-16-2017   BPH (benign prostatic hyperplasia)    CKD (chronic kidney disease), stage III (HCC)    STAGE 3  B CKD per lov 10-16-2020 dr Thyra Breed [facility] on chart   Dyspnea    Fatigue    MIDDLE OF DAY ON OCCASION   Frequency of urination    GERD (gastroesophageal reflux disease)    on protonix   Hematuria    Hematuria    History of colon polyps    HLD (hyperlipidemia)    HOH (hard of hearing)    slightly hoh right worse thanleft   HTN (hypertension)    Hypothyroidism    Incontinence of urine    weras pads all the time   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   Right Buccal Cheek   Pre-diabetes    Recurrent bladder papillary carcinoma (HCC) hx 2012 and 2014-- urologist- dr Ronne Binning   s/p  TURBT 08-16-2017  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   Sleep apnea    uses cpap set on 20 /4   Trigger finger of left hand 07/04/2021   last few months per pt   UTI (urinary tract infection) 08/20/2020   Weakness of extremity    left knee   Wears glasses  Past Surgical History:  Procedure Laterality Date   BCG X 2     AFTER LAST 2 BLADDER TUMOR REMOVALS   CATARACT EXTRACTION, BILATERAL     COLONSCOPY  YRS AGO   CYSTOSCOPY N/A 10/29/2020   Procedure: CYSTOSCOPY;  Surgeon: Noel Christmas, MD;  Location: [facility];  Service: Urology;  Laterality: N/A;   CYSTOSCOPY N/A 07/08/2021   Procedure: CYSTOSCOPY;  Surgeon: Noel Christmas, MD;  Location: [facility];  Service: Urology;  Laterality: N/A;   CYSTOSCOPY W/ RETROGRADES Right  07/21/2023   Procedure: CYSTOSCOPY WITH RIGHT RETROGRADE PYELOGRAM;  Surgeon: Loletta Parish., MD;  Location: [facility];  Service: Urology;  Laterality: Right;   CYSTOSCOPY W/ URETERAL STENT PLACEMENT Bilateral 08/16/2017   Procedure: CYSTOSCOPY WITH RETROGRADE PYELOGRAM/URETERAL STENT PLACEMENT;  Surgeon: Malen Gauze, MD;  Location: [facility];  Service: Urology;  Laterality: Bilateral;   CYSTOSCOPY W/ URETERAL STENT PLACEMENT Left 09/02/2022   Procedure: CYSTOSCOPY WITH RETROGRADE PYELOGRAM, URETERAL STENT PLACEMENT;  Surgeon: Sebastian Ache, MD;  Location: [facility];  Service: Urology;  Laterality: Left;   CYSTOSCOPY WITH BIOPSY N/A 06/25/2020   Procedure: CYSTOSCOPY FULGURATION   BLADDER  BIOPSY;  Surgeon: Noel Christmas, MD;  Location: [facility];  Service: Urology;  Laterality: N/A;   CYSTOSCOPY WITH FULGERATION N/A 12/08/2019   Procedure: CYSTOSCOPY WITH FULGERATION;  Surgeon: Malen Gauze, MD;  Location: [facility];  Service: Urology;  Laterality: N/A;   CYSTOSCOPY WITH FULGERATION N/A 03/24/2022   Procedure: CYSTOSCOPY WITH FULGERATION;  Surgeon: Noel Christmas, MD;  Location: [facility];  Service: Urology;  Laterality: N/A;   CYSTOSCOPY WITH RETROGRADE PYELOGRAM, URETEROSCOPY AND STENT PLACEMENT Left 03/24/2022   Procedure: CYSTOSCOPY WITH RETROGRADE PYELOGRAM, DIAGNOSTIC URETEROSCOPY WITH BIOPSY AND STENT PLACEMENT;  Surgeon: Noel Christmas, MD;  Location: [facility];  Service: Urology;  Laterality: Left;  90 MINUTES   CYSTOSCOPY WITH RETROGRADE PYELOGRAM, URETEROSCOPY AND STENT PLACEMENT Left 04/07/2022   Procedure: CYSTOSCOPY WITH  RETROGRADE PYELOGRAM, DIAGNOSTIC URETEROSCOPY WITH BIOPSY AND STENT EXCHANGE;  Surgeon: Noel Christmas, MD;  Location: [facility];  Service: Urology;  Laterality: Left;  75 MINUTES   CYSTOSCOPY WITH RETROGRADE PYELOGRAM, URETEROSCOPY AND STENT PLACEMENT Left 08/11/2022   Procedure: CYSTOSCOPY WITH  RETROGRADE PYELOGRAM, ANTEGRADE NEPHROSTOGRAM, DIAGNOSTIC  URETEROSCOPY;  Surgeon: Noel Christmas, MD;  Location: [facility];  Service: Urology;  Laterality: Left;  90 MINS   HOLMIUM LASER APPLICATION Left 08/11/2022   Procedure: HOLMIUM LASER APPLICATION;  Surgeon: Noel Christmas, MD;  Location: [facility];  Service: Urology;  Laterality: Left;   IR NEPHROSTOMY EXCHANGE LEFT  05/15/2022   IR NEPHROSTOMY EXCHANGE LEFT  07/10/2022   IR NEPHROSTOMY PLACEMENT LEFT  05/06/2022   KNEE ARTHROSCOPY Left 06/10/2011   LYMPH NODE DISSECTION Left 09/02/2022   Procedure: RETROPERITONEAL LYMPH NODE DISSECTION;  Surgeon: Sebastian Ache, MD;  Location: [facility];  Service: Urology;  Laterality: Left;   PROSTATE BIOPSY  2000   ROBOT ASSITED LAPAROSCOPIC NEPHROURETERECTOMY Left 09/02/2022   Procedure: XI ROBOT ASSITED LAPAROSCOPIC NEPHROURETERECTOMY;  Surgeon: Sebastian Ache, MD;  Location: [facility];  Service: Urology;  Laterality: Left;   TRANSURETHRAL RESECTION OF BLADDER TUMOR N/A 08/16/2017   Procedure: TRANSURETHRAL RESECTION OF BLADDER TUMOR (TURBT);  Surgeon: Malen Gauze, MD;  Location: [facility];  Service: Urology;  Laterality: N/A;   TRANSURETHRAL RESECTION OF BLADDER TUMOR  12-09-2012   dr Marcelyn Bruins [facility]  and TURP   TRANSURETHRAL RESECTION OF BLADDER TUMOR N/A 09/20/2017   Procedure: TRANSURETHRAL RESECTION OF BLADDER TUMOR (TURBT);  Surgeon: Malen Gauze, MD;  Location: [facility];  Service: Urology;  Laterality: N/A;   TRANSURETHRAL RESECTION OF BLADDER TUMOR N/A 12/08/2019   Procedure: TRANSURETHRAL RESECTION OF BLADDER TUMOR;  Surgeon: Malen Gauze, MD;  Location: [facility];  Service: Urology;  Laterality: N/A;  1 HR   TRANSURETHRAL RESECTION OF BLADDER TUMOR N/A 10/29/2020   Procedure: BLADDER FULGERATION;  Surgeon: Noel Christmas, MD;  Location: [facility];  Service: Urology;  Laterality: N/A;  30 MINS    TRANSURETHRAL RESECTION OF BLADDER TUMOR N/A 07/08/2021   Procedure: TRANSURETHRAL RESECTION OF BLADDER TUMOR (TURBT)/FULGERATION;  Surgeon: Noel Christmas, MD;  Location: [facility];  Service: Urology;  Laterality: N/A;  45 MINS   TRANSURETHRAL RESECTION OF BLADDER TUMOR N/A 12/02/2021   Procedure: TRANSURETHRAL RESECTION OF BLADDER TUMOR (TURBT);  Surgeon: Noel Christmas, MD;  Location: [facility];  Service: Urology;  Laterality: N/A;   TRANSURETHRAL RESECTION OF BLADDER TUMOR N/A 07/21/2023   Procedure: TRANSURETHRAL RESECTION OF BLADDER TUMOR (TURBT);  Surgeon: Loletta Parish., MD;  Location: [facility];  Service: Urology;  Laterality: N/A;  60 MINUTES   TRANSURETHRAL RESECTION OF PROSTATE  2011   UPPER GI ENDOSCOPY  YRS AGO   Social History:  reports that he has never smoked. He has never used smokeless tobacco. He reports that he does not currently use alcohol after a past usage of about 1.0 - 2.0 standard drink of alcohol per week. He reports that he does not use drugs.  Allergies  Allergen Reactions   Macrobid [Nitrofurantoin] Shortness Of Breath   Ciprofloxacin Diarrhea    Occurred with 750mg  dose.  Has since taken 500mg  doses and has had no reaction/diarrhea.   Tramadol Hcl     Pt unsure of reaction    Cephalexin Rash    Rash on arms   [name] [Mitomycin] Rash    All over body    Family History  Problem Relation Age of Onset   Hypertension Mother    Breast cancer Mother    Other Mother        brain tumor   Alzheimer's disease Mother    Emphysema Father        smoker    Prior to Admission medications   Medication Sig Start Date End Date Taking? Authorizing Provider  acetaminophen (TYLENOL) 500 MG tablet Take 1,000 mg by mouth every 6 (six) hours as needed for moderate pain.    [provider]  amLODipine (NORVASC) 10 MG tablet Take 1 tablet (10 mg total) by mouth daily. Patient taking differently: Take 10 mg by mouth at bedtime. 08/23/20    Almon Hercules, MD  aspirin EC 81 MG tablet Take 81 mg by mouth daily. Swallow whole.    [provider]  ferrous sulfate 325 (65 FE) MG tablet Take 325 mg by mouth 2 (two) times daily with a meal.    [provider]  finasteride (PROSCAR) 5 MG tablet TAKE ONE TABLET BY MOUTH DAILY Patient taking differently: Take 5 mg by mouth at bedtime. 10/28/20   McKenzie, Mardene Celeste, MD  levocetirizine (XYZAL) 5 MG tablet 1 tablet in the evening for 7 days then daily as needed Orally Once a day for 30 days 08/03/23   [provider]  levothyroxine (SYNTHROID, LEVOTHROID) 50 MCG tablet Take  50 mcg by mouth daily before breakfast. 12/25/15   [provider]  lidocaine-prilocaine (EMLA) cream Apply to affected area once 09/10/23   [name], MD  mirabegron ER (MYRBETRIQ) 50 MG TB24 tablet Take 50 mg by mouth at bedtime.    [provider]  NON FORMULARY Pt uses a cpap nightly    [provider]  ondansetron (ZOFRAN) 8 MG tablet Take 1 tablet (8 mg total) by mouth every 8 (eight) hours as needed for nausea or vomiting. Patient not taking: Reported on 09/16/2023 09/10/23   [name], MD  oxyCODONE (ROXICODONE) 5 MG immediate release tablet Take 1 tablet (5 mg total) by mouth every 6 (six) hours as needed for moderate pain or severe pain (Post-operatively). Patient not taking: Reported on 09/16/2023 07/21/23 07/20/24  Loletta Parish., MD  pantoprazole (PROTONIX) 20 MG tablet Take 20 mg by mouth at bedtime. 12/20/15   [provider]  prochlorperazine (COMPAZINE) 10 MG tablet Take 1 tablet (10 mg total) by mouth every 6 (six) hours as needed for nausea or vomiting. Patient not taking: Reported on 09/16/2023 09/10/23   [name], MD  psyllium (METAMUCIL) 58.6 % powder Take 0.5 packets by mouth every evening.    [provider]  rosuvastatin (CRESTOR) 5 MG tablet Take 5 mg by mouth at bedtime.    [provider]   senna-docusate (SENOKOT-S) 8.6-50 MG tablet Take 1 tablet by mouth 2 (two) times daily. While taking strong pain meds to prevent constipation 09/04/22   Manny, Delbert Phenix., MD  silodosin (RAPAFLO) 8 MG CAPS capsule TAKE ONE CAPSULE BY MOUTH EVERY NIGHT AT BEDTIME 07/25/20   McKenzie, Mardene Celeste, MD  sodium bicarbonate 650 MG tablet 1 tablet Orally twice a day 11/05/22   [provider]    Physical Exam: Vitals:   09/28/23 1000 09/28/23 1015 09/28/23 1144 09/28/23 1144  BP: (!) 159/85     Pulse:  87    Resp:   18   Temp:   98 F (36.7 C)   SpO2:  100%    Weight:    75 kg  Height:    5\' 8"  (1.727 m)   Physical Exam Vitals and nursing note reviewed.  Constitutional:      General: He is awake. He is not in acute distress.    Appearance: Normal appearance. He is ill-appearing.  HENT:     Head: Normocephalic.     Nose: No rhinorrhea.     Mouth/Throat:     Mouth: Mucous membranes are moist.  Eyes:     General: No scleral icterus.    Pupils: Pupils are equal, round, and reactive to light.  Neck:     Vascular: No JVD.  Cardiovascular:     Rate and Rhythm: Normal rate and regular rhythm.     Heart sounds: S1 normal and S2 normal.  Pulmonary:     Effort: Pulmonary effort is normal.     Breath sounds: Normal breath sounds. No wheezing, rhonchi or rales.  Abdominal:     General: Bowel sounds are normal. There is no distension.     Palpations: Abdomen is soft.     Tenderness: There is abdominal tenderness in the epigastric area. There is no right CVA tenderness or left CVA tenderness.  Musculoskeletal:     Cervical back: Neck supple.     Right lower leg: No edema.     Left lower leg: No edema.  Skin:  General: Skin is warm and dry.  Neurological:     General: No focal deficit present.     Mental Status: He is alert and oriented to person, place, and time.  Psychiatric:        Mood and Affect: Mood normal.        Behavior: Behavior normal. Behavior is cooperative.     Data Reviewed:  Results are pending, will review when available.  05/03/2020 transthoracic echocardiogram IMPRESSIONS:   1. Left ventricular ejection fraction, by estimation, is 55 to 60%. The  left ventricle has normal function. The left ventricle has no regional  wall motion abnormalities. There is mild concentric left ventricular  hypertrophy. Left ventricular diastolic  parameters were normal.   2. Right ventricular systolic function is normal. The right ventricular  size is normal.   3. Left atrial size was mildly dilated.   4. The mitral valve is normal in structure. Trivial mitral valve  regurgitation. No evidence of mitral stenosis.   5. The aortic valve is tricuspid. Aortic valve regurgitation is mild.  Mild aortic valve sclerosis is present, with no evidence of aortic valve  stenosis.   6. There is mild dilatation of the ascending aorta measuring 39 mm.   7. The inferior vena cava is normal in size with greater than 50%  respiratory variability, suggesting right atrial pressure of 3 mmHg.   Assessment and Plan: Principal Problem:   Acute blood loss anemia (ABLA) In the setting of:   Gross hematuria In the setting of:   Malignant neoplasm of urinary bladder (HCC) Observation/telemetry. Continue sealock the same or formulary equivalent. Continue finasteride 5 mg p.o. daily. Continue CBI. Monitor hematocrit and hemoglobin. Transfuse as needed. Urology input appreciated.  Active Problems:   CKD (chronic kidney disease) stage 4, GFR 15-29 ml/min (HCC) Around baseline. Monitor renal and electrolytes.    Sleep apnea treated with nocturnal BiPAP Continue BiPAP at bedtime.    Essential hypertension Continue amlodipine 10 mg p.o. daily.    Hypothyroidism  Continue levothyroxine 50 mcg p.o. daily.    GERD (gastroesophageal reflux disease) Continue pantoprazole 20 mg p.o. daily.    Mixed hyperlipidemia Continue rosuvastatin 5 mg p.o. daily.     Prediabetes Glucose only 104 mg deciliter. Check fasting glucose and hemoglobin A1c.     Advance Care Planning:   Code Status: Full Code   Consults: Urology Marcha Solders, MD).  Family Communication:   Severity of Illness: The appropriate patient status for this patient is OBSERVATION. Observation status is judged to be reasonable and necessary in order to provide the required intensity of service to ensure the patient's safety. The patient's presenting symptoms, physical exam findings, and initial radiographic and laboratory data in the context of their medical condition is felt to place them at decreased risk for further clinical deterioration. Furthermore, it is anticipated that the patient will be medically stable for discharge from the hospital within 2 midnights of admission.   Author: Bobette Mo, MD 09/28/2023 1:28 PM  For on call review [email or web address].   This document was prepared using Dragon voice recognition software and may contain some unintended transcription errors.

## 2023-09-28 NOTE — Plan of Care (Signed)
  Problem: Education: Goal: Knowledge of General Education information will improve Description: Including pain rating scale, medication(s)/side effects and non-pharmacologic comfort measures Outcome: Progressing   Problem: Clinical Measurements: Goal: Ability to maintain clinical measurements within normal limits will improve Outcome: Progressing   Problem: Activity: Goal: Risk for activity intolerance will decrease Outcome: Progressing   Problem: Elimination: Goal: Will not experience complications related to urinary retention Outcome: Progressing   

## 2023-09-28 NOTE — ED Triage Notes (Signed)
PT BIBA for inability to urinate x 2 hours and hematuria.   Pt has HX of bladder cancer No Foley present on exam.  EDP at bedside

## 2023-09-28 NOTE — Progress Notes (Signed)
Patient declined BIPAP use at this time. Patient aware to let staff know if he would like to use BIPAP.

## 2023-09-28 NOTE — Progress Notes (Signed)
   09/28/23 1900  BiPAP/CPAP/SIPAP  Reason BIPAP/CPAP not in use Other(comment) (pt declined use of BIPAP)  BiPAP/CPAP /SiPAP Vitals  Resp 12  MEWS Score/Color  MEWS Score 1  MEWS Score Color Green

## 2023-09-28 NOTE — Consult Note (Signed)
Urology Consult Note   Requesting Attending Physician:  Glynn Octave, MD Service Providing Consult: Urology  Consulting Attending: Dr. Margo Aye   Reason for Consult:  Hematuria with clot obstruction of urine.  HPI: [name] [name] is seen in consultation for reasons noted above at the request of Rancour, Jeannett Senior, MD. patient presents to [facility] emergency department with his inability urinate for 2 hours. He is well-known to our practice. Past urologic history includes bladder cancer in 2021, s/p TURBT plus BCG x 6. Bladder and left renal pelvis cancer in 2023, failing [name] and ultimately resulting in left nephroureterectomy.  He was started on Keytruda roughly 2 weeks ago and presented to [facility] urology yesterday for minor hematuria.  Today he is experiencing clot obstruction of urine, though was ultimately able to void with complete emptying of his bladder while in the emergency department. Urology has been consulted to speak to the above and determine whether or not patient would benefit from CBI.  On arrival patient was in no distress.  With some effort he was able to pass clot material and void almost completely.  PVR of 85 cc.  CT A/P collected.  Hematuria CT could not be completed considering his limited renal function.  Minor amount of clot material at base of bladder. ------------------  Assessment:  89 y.o. male with clot obstruction of urine in the context of a solitary kidney, on Keytruda   Recommendations: # Hematuria # S/p TURBT plus BCG x 6 # Left ureterectomy failing [name]  CT A/P collected showing small amount of clot material at the base of the bladder.  Unclear whether or not this will be enough to cause clot obstruction.  Overnight observation.  Considering his solitary kidney and CKD, he would not tolerate obstruction for very long.  The shared decision was made to proceed with hematuria catheter and CBI initiation overnight.  Please see separate  procedure note  Titrate CBI irrigation to pink lemonade color.  Hand irrigate as necessary  NPO at midnight  Case and plan discussed with Dr. Margo Aye and Dr. Jennette Bill  Past Medical History: Past Medical History:  Diagnosis Date   Anemia    Arthritis    lower back bulging disc, hips, knees, thumbs, shoulder   Asthma    as a pre teen   Bladder cancer (HCC) UROLOGIST-  DR MCKENZIE   S/P TURBT 08-16-2017   BPH (benign prostatic hyperplasia)    CKD (chronic kidney disease), stage III (HCC)    STAGE 3  B CKD per lov 10-16-2020 dr Thyra Breed [facility] kidney on chart   Dyspnea    Fatigue    MIDDLE OF DAY ON OCCASION   Frequency of urination    GERD (gastroesophageal reflux disease)    on protonix   Hematuria    Hematuria    History of colon polyps    HLD (hyperlipidemia)    HOH (hard of hearing)    slightly hoh right worse thanleft   HTN (hypertension)    Hypothyroidism    Incontinence of urine    weras pads all the time   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   Right Buccal Cheek   Pre-diabetes    Recurrent bladder papillary carcinoma (HCC) hx 2012 and 2014-- urologist- dr Ronne Binning   s/p  TURBT 08-16-2017  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   Sleep apnea    uses cpap set on 20 /4   Trigger finger of left hand 07/04/2021  last few months per pt   UTI (urinary tract infection) 08/20/2020   Weakness of extremity    left knee   Wears glasses     Past Surgical History:  Past Surgical History:  Procedure Laterality Date   BCG X 2     AFTER LAST 2 BLADDER TUMOR REMOVALS   CATARACT EXTRACTION, BILATERAL     COLONSCOPY  YRS AGO   CYSTOSCOPY N/A 10/29/2020   Procedure: CYSTOSCOPY;  Surgeon: Noel Christmas, MD;  Location: [facility];  Service: Urology;  Laterality: N/A;   CYSTOSCOPY N/A 07/08/2021   Procedure: CYSTOSCOPY;  Surgeon: Noel Christmas, MD;  Location: [facility];  Service: Urology;   Laterality: N/A;   CYSTOSCOPY W/ RETROGRADES Right 07/21/2023   Procedure: CYSTOSCOPY WITH RIGHT RETROGRADE PYELOGRAM;  Surgeon: Loletta Parish., MD;  Location: [facility];  Service: Urology;  Laterality: Right;   CYSTOSCOPY W/ URETERAL STENT PLACEMENT Bilateral 08/16/2017   Procedure: CYSTOSCOPY WITH RETROGRADE PYELOGRAM/URETERAL STENT PLACEMENT;  Surgeon: Malen Gauze, MD;  Location: [facility];  Service: Urology;  Laterality: Bilateral;   CYSTOSCOPY W/ URETERAL STENT PLACEMENT Left 09/02/2022   Procedure: CYSTOSCOPY WITH RETROGRADE PYELOGRAM, URETERAL STENT PLACEMENT;  Surgeon: Sebastian Ache, MD;  Location: [facility];  Service: Urology;  Laterality: Left;   CYSTOSCOPY WITH BIOPSY N/A 06/25/2020   Procedure: CYSTOSCOPY FULGURATION   BLADDER  BIOPSY;  Surgeon: Noel Christmas, MD;  Location: [facility];  Service: Urology;  Laterality: N/A;   CYSTOSCOPY WITH FULGERATION N/A 12/08/2019   Procedure: CYSTOSCOPY WITH FULGERATION;  Surgeon: Malen Gauze, MD;  Location: [facility];  Service: Urology;  Laterality: N/A;   CYSTOSCOPY WITH FULGERATION N/A 03/24/2022   Procedure: CYSTOSCOPY WITH FULGERATION;  Surgeon: Noel Christmas, MD;  Location: [facility];  Service: Urology;  Laterality: N/A;   CYSTOSCOPY WITH RETROGRADE PYELOGRAM, URETEROSCOPY AND STENT PLACEMENT Left 03/24/2022   Procedure: CYSTOSCOPY WITH RETROGRADE PYELOGRAM, DIAGNOSTIC URETEROSCOPY WITH BIOPSY AND STENT PLACEMENT;  Surgeon: Noel Christmas, MD;  Location: [facility];  Service: Urology;  Laterality: Left;  90 MINUTES   CYSTOSCOPY WITH RETROGRADE PYELOGRAM, URETEROSCOPY AND STENT PLACEMENT Left 04/07/2022   Procedure: CYSTOSCOPY WITH  RETROGRADE PYELOGRAM, DIAGNOSTIC URETEROSCOPY WITH BIOPSY AND STENT EXCHANGE;  Surgeon: Noel Christmas, MD;  Location: [facility];  Service: Urology;  Laterality: Left;  75 MINUTES   CYSTOSCOPY WITH RETROGRADE PYELOGRAM, URETEROSCOPY AND STENT  PLACEMENT Left 08/11/2022   Procedure: CYSTOSCOPY WITH RETROGRADE PYELOGRAM, ANTEGRADE NEPHROSTOGRAM, DIAGNOSTIC  URETEROSCOPY;  Surgeon: Noel Christmas, MD;  Location: [facility];  Service: Urology;  Laterality: Left;  90 MINS   HOLMIUM LASER APPLICATION Left 08/11/2022   Procedure: HOLMIUM LASER APPLICATION;  Surgeon: Noel Christmas, MD;  Location: [facility];  Service: Urology;  Laterality: Left;   IR NEPHROSTOMY EXCHANGE LEFT  05/15/2022   IR NEPHROSTOMY EXCHANGE LEFT  07/10/2022   IR NEPHROSTOMY PLACEMENT LEFT  05/06/2022   KNEE ARTHROSCOPY Left 06/10/2011   LYMPH NODE DISSECTION Left 09/02/2022   Procedure: RETROPERITONEAL LYMPH NODE DISSECTION;  Surgeon: Sebastian Ache, MD;  Location: [facility];  Service: Urology;  Laterality: Left;   PROSTATE BIOPSY  2000   ROBOT ASSITED LAPAROSCOPIC NEPHROURETERECTOMY Left 09/02/2022   Procedure: XI ROBOT ASSITED LAPAROSCOPIC NEPHROURETERECTOMY;  Surgeon: Sebastian Ache, MD;  Location: [facility];  Service: Urology;  Laterality: Left;   TRANSURETHRAL RESECTION OF BLADDER TUMOR N/A 08/16/2017   Procedure: TRANSURETHRAL RESECTION OF BLADDER TUMOR (TURBT);  Surgeon: Malen Gauze, MD;  Location: [facility];  Service: Urology;  Laterality: N/A;   TRANSURETHRAL RESECTION OF BLADDER TUMOR  12-09-2012   dr Marcelyn Bruins [facility]   and TURP   TRANSURETHRAL RESECTION OF BLADDER TUMOR N/A 09/20/2017   Procedure: TRANSURETHRAL RESECTION OF BLADDER TUMOR (TURBT);  Surgeon: Malen Gauze, MD;  Location: [facility];  Service: Urology;  Laterality: N/A;   TRANSURETHRAL RESECTION OF BLADDER TUMOR N/A 12/08/2019   Procedure: TRANSURETHRAL RESECTION OF BLADDER TUMOR;  Surgeon: Malen Gauze, MD;  Location: [facility];  Service: Urology;  Laterality: N/A;  1 HR   TRANSURETHRAL RESECTION OF BLADDER TUMOR N/A 10/29/2020   Procedure: BLADDER FULGERATION;  Surgeon: Noel Christmas, MD;  Location: [facility];   Service: Urology;  Laterality: N/A;  30 MINS   TRANSURETHRAL RESECTION OF BLADDER TUMOR N/A 07/08/2021   Procedure: TRANSURETHRAL RESECTION OF BLADDER TUMOR (TURBT)/FULGERATION;  Surgeon: Noel Christmas, MD;  Location: [facility];  Service: Urology;  Laterality: N/A;  45 MINS   TRANSURETHRAL RESECTION OF BLADDER TUMOR N/A 12/02/2021   Procedure: TRANSURETHRAL RESECTION OF BLADDER TUMOR (TURBT);  Surgeon: Noel Christmas, MD;  Location[facility];  Service: Urology;  Laterality: N/A;   TRANSURETHRAL RESECTION OF BLADDER TUMOR N/A 07/21/2023   Procedure: TRANSURETHRAL RESECTION OF BLADDER TUMOR (TURBT);  Surgeon: Loletta Parish., MD;  Location[facility];  Service: Urology;  Laterality: N/A;  60 MINUTES   TRANSURETHRAL RESECTION OF PROSTATE  2011   UPPER GI ENDOSCOPY  YRS AGO    Medication: No current facility-administered medications for this encounter.   Current Outpatient Medications  Medication Sig Dispense Refill   acetaminophen (TYLENOL) 500 MG tablet Take 1,000 mg by mouth every 6 (six) hours as needed for moderate pain.     amLODipine (NORVASC) 10 MG tablet Take 1 tablet (10 mg total) by mouth daily. (Patient taking differently: Take 10 mg by mouth at bedtime.) 90 tablet 1   aspirin EC 81 MG tablet Take 81 mg by mouth daily. Swallow whole.     ferrous sulfate 325 (65 FE) MG tablet Take 325 mg by mouth 2 (two) times daily with a meal.     finasteride (PROSCAR) 5 MG tablet TAKE ONE TABLET BY MOUTH DAILY (Patient taking differently: Take 5 mg by mouth at bedtime.) 90 tablet 3   levocetirizine (XYZAL) 5 MG tablet 1 tablet in the evening for 7 days then daily as needed Orally Once a day for 30 days     levothyroxine (SYNTHROID, LEVOTHROID) 50 MCG tablet Take 50 mcg by mouth daily before breakfast.     lidocaine-prilocaine (EMLA) cream Apply to affected area once 30 g 3   mirabegron ER (MYRBETRIQ) 50 MG TB24 tablet Take 50 mg by mouth at bedtime.     NON FORMULARY Pt uses  a cpap nightly     ondansetron (ZOFRAN) 8 MG tablet Take 1 tablet (8 mg total) by mouth every 8 (eight) hours as needed for nausea or vomiting. (Patient not taking: Reported on 09/16/2023) 30 tablet 1   oxyCODONE (ROXICODONE) 5 MG immediate release tablet Take 1 tablet (5 mg total) by mouth every 6 (six) hours as needed for moderate pain or severe pain (Post-operatively). (Patient not taking: Reported on 09/16/2023) 10 tablet 0   pantoprazole (PROTONIX) 20 MG tablet Take 20 mg by mouth at bedtime.     prochlorperazine (COMPAZINE) 10 MG tablet Take 1 tablet (  10 mg total) by mouth every 6 (six) hours as needed for nausea or vomiting. (Patient not taking: Reported on 09/16/2023) 30 tablet 1   psyllium (METAMUCIL) 58.6 % powder Take 0.5 packets by mouth every evening.     rosuvastatin (CRESTOR) 5 MG tablet Take 5 mg by mouth at bedtime.     senna-docusate (SENOKOT-S) 8.6-50 MG tablet Take 1 tablet by mouth 2 (two) times daily. While taking strong pain meds to prevent constipation 10 tablet 0   silodosin (RAPAFLO) 8 MG CAPS capsule TAKE ONE CAPSULE BY MOUTH EVERY NIGHT AT BEDTIME 90 capsule 3   sodium bicarbonate 650 MG tablet 1 tablet Orally twice a day      Allergies: Allergies  Allergen Reactions   Macrobid [Nitrofurantoin] Shortness Of Breath   Ciprofloxacin Diarrhea    Occurred with 750mg  dose.  Has since taken 500mg  doses and has had no reaction/diarrhea.   Tramadol Hcl     Pt unsure of reaction    Cephalexin Rash    Rash on arms   [name] [Mitomycin] Rash    All over body    Social History: Social History   Tobacco Use   Smoking status: Never   Smokeless tobacco: Never  Vaping Use   Vaping status: Never Used  Substance Use Topics   Alcohol use: Not Currently    Alcohol/week: 1.0 - 2.0 standard drink of alcohol    Types: 1 - 2 Glasses of wine per week    Comment: rare   Drug use: No    Family History Family History  Problem Relation Age of Onset   Hypertension Mother     Breast cancer Mother    Other Mother        brain tumor   Alzheimer's disease Mother    Emphysema Father        smoker    Review of Systems  Genitourinary:  Positive for hematuria.     Objective   Vital signs in last 24 hours: BP (!) 159/85   Pulse 87   Temp 98 F (36.7 C)   Resp 18   SpO2 100%   Physical Exam General: NAD, A&O, resting, appropriate HEENT: [place]/AT Pulmonary: Normal work of breathing Cardiovascular: RRR, no cyanosis Abdomen: Soft, NTTP, nondistended GU: 3-way foley in place with CBI on low. Light pink irrigant   Most Recent Labs: Lab Results  Component Value Date   WBC 7.2 09/28/2023   HGB 10.8 (L) 09/28/2023   HCT 32.4 (L) 09/28/2023   PLT 257 09/28/2023    Lab Results  Component Value Date   NA 138 09/28/2023   K 4.1 09/28/2023   CL 107 09/28/2023   CO2 21 (L) 09/28/2023   BUN 55 (H) 09/28/2023   CREATININE 2.97 (H) 09/28/2023   CALCIUM 9.3 09/28/2023   MG 2.3 08/23/2020   PHOS 4.8 (H) 08/23/2020    Lab Results  Component Value Date   INR 1.0 05/06/2022     Urine Culture: @[id number] (laburin,org,r9620,r9621)@   IMAGING: No results found.  ------  [name], NP Pager: [phone]   Please contact the urology consult pager with any further questions/concerns.

## 2023-09-28 NOTE — ED Notes (Signed)
ED TO INPATIENT HANDOFF REPORT  Name/Age/Gender [name] [name] 89 y.o. male  Code Status    Code Status Orders  (From admission, onward)           Start     Ordered   09/28/23 1357  Full code  Continuous       Question:  By:  Answer:  Consent: discussion documented in EHR   09/28/23 1357           Code Status History     Date Active Date Inactive Code Status Order ID Comments User Context   08/20/2020 1834 08/23/2020 1852 Partial Code [id number]  Teddy Spike, DO ED      Advance Directive Documentation    Flowsheet Row Most Recent Value  Type of Advance Directive Healthcare Power of Attorney  Pre-existing out of facility DNR order (yellow form or pink MOST form) --  "MOST" Form in Place? --       Home/SNF/Other Home  Chief Complaint Gross hematuria [R31.0]  Level of Care/Admitting Diagnosis ED Disposition     ED Disposition  Admit   Condition  --   Comment  Hospital Area: [facility] [[id number]]  Level of Care: Telemetry [5]  Admit to tele based on following criteria: Other see comments  Comments: Gross hematuria with ABLA.  May [facility] patient in observation at [facility] or [facility] if equivalent level of care is available:: No  Covid Evaluation: Asymptomatic - no recent exposure (last 10 days) testing not required  Diagnosis: Gross hematuria [599.71.ICD-9-CM]  Admitting Physician: Bobette Mo [[id number]]  Attending Physician: Bobette Mo [[id number]]          Medical History Past Medical History:  Diagnosis Date   Anemia    Arthritis    lower back bulging disc, hips, knees, thumbs, shoulder   Asthma    as a pre teen   Bladder cancer (HCC) UROLOGIST-  DR MCKENZIE   S/P TURBT 08-16-2017   BPH (benign prostatic hyperplasia)    CKD (chronic kidney disease), stage III (HCC)    STAGE 3  B CKD per lov 10-16-2020 dr Thyra Breed [facility] kidney on chart   Dyspnea    Fatigue    MIDDLE OF DAY ON OCCASION    Frequency of urination    GERD (gastroesophageal reflux disease)    on protonix   Hematuria    Hematuria    History of colon polyps    HLD (hyperlipidemia)    HOH (hard of hearing)    slightly hoh right worse thanleft   HTN (hypertension)    Hypothyroidism    Incontinence of urine    weras pads all the time   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   Right Buccal Cheek   Pre-diabetes    Recurrent bladder papillary carcinoma (HCC) hx 2012 and 2014-- urologist- dr Ronne Binning   s/p  TURBT 08-16-2017  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  Has since taken 500mg  doses and has had no reaction/diarrhea.   Tramadol Hcl  Pt unsure of reaction    Cephalexin Rash    Rash on arms   [name] [Mitomycin] Rash    All over body    IV Location/Drains/Wounds Patient Lines/Drains/Airways Status     Active Line/Drains/Airways     Name Placement date Placement time Site Days   Peripheral IV 09/28/23 20 G Anterior;Distal;Left;Upper Arm 09/28/23  1049  Arm  less than 1   Closed System Drain 1 Left LLQ Bulb (JP) 15 Fr. 09/02/22  1122  LLQ  391   Incision - 3 Ports Abdomen 1: Left;Lower 2: Left;Mid 3: Left;Upper 09/02/22  --  -- 391            Labs/Imaging Results for orders placed or performed during the hospital encounter of 09/28/23 (from the past 48 hour(s))  Urinalysis, Routine w reflex microscopic -Urine, Clean Catch     Status: Abnormal   Collection Time: 09/28/23 10:55 AM  Result Value Ref Range   Color, Urine RED (A) YELLOW    Comment: BIOCHEMICALS MAY BE AFFECTED BY COLOR   APPearance  TURBID (A) CLEAR   Specific Gravity, Urine  1.005 - 1.030    TEST NOT REPORTED DUE TO COLOR INTERFERENCE OF URINE PIGMENT   pH  5.0 - 8.0    TEST NOT REPORTED DUE TO COLOR INTERFERENCE OF URINE PIGMENT   Glucose, UA (A) NEGATIVE mg/dL    TEST NOT REPORTED DUE TO COLOR INTERFERENCE OF URINE PIGMENT   Hgb urine dipstick (A) NEGATIVE    TEST NOT REPORTED DUE TO COLOR INTERFERENCE OF URINE PIGMENT   Bilirubin Urine (A) NEGATIVE    TEST NOT REPORTED DUE TO COLOR INTERFERENCE OF URINE PIGMENT   Ketones, ur (A) NEGATIVE mg/dL    TEST NOT REPORTED DUE TO COLOR INTERFERENCE OF URINE PIGMENT   Protein, ur (A) NEGATIVE mg/dL    TEST NOT REPORTED DUE TO COLOR INTERFERENCE OF URINE PIGMENT   Nitrite (A) NEGATIVE    TEST NOT REPORTED DUE TO COLOR INTERFERENCE OF URINE PIGMENT   Leukocytes,Ua (A) NEGATIVE    TEST NOT REPORTED DUE TO COLOR INTERFERENCE OF URINE PIGMENT    Comment: Performed at [facility], [address] [place], [place] [address]  CBC with Differential     Status: Abnormal   Collection Time: 09/28/23 10:55 AM  Result Value Ref Range   WBC 7.2 4.0 - 10.5 K/uL   RBC 3.55 (L) 4.22 - 5.81 MIL/uL   Hemoglobin 10.8 (L) 13.0 - 17.0 g/dL   HCT 08.6 (L) 57.8 - 46.9 %   MCV 91.3 80.0 - 100.0 fL   MCH 30.4 26.0 - 34.0 pg   MCHC 33.3 30.0 - 36.0 g/dL   RDW 62.9 52.8 - 41.3 %   Platelets 257 150 - 400 K/uL   nRBC 0.0 0.0 - 0.2 %   Neutrophils Relative % 83 %   Neutro Abs 6.0 1.7 - 7.7 K/uL   Lymphocytes Relative 6 %   Lymphs Abs 0.4 (L) 0.7 - 4.0 K/uL   Monocytes Relative 7 %   Monocytes Absolute 0.5 0.1 - 1.0 K/uL   Eosinophils Relative 3 %   Eosinophils Absolute 0.2 0.0 - 0.5 K/uL   Basophils Relative 1 %   Basophils Absolute 0.1 0.0 - 0.1 K/uL   Immature Granulocytes 0 %   Abs Immature Granulocytes 0.01 0.00 - 0.07 K/uL    Comment: Performed at [facility], [address], [place], [place] [address]  Basic metabolic panel  Status:  Abnormal   Collection Time: 09/28/23 10:55 AM  Result Value Ref Range   Sodium 138 135 - 145 mmol/L   Potassium 4.1 3.5 - 5.1 mmol/L   Chloride 107 98 - 111 mmol/L   CO2 21 (L) 22 - 32 mmol/L   Glucose, Bld 104 (H) 70 - 99 mg/dL    Comment: Glucose reference range applies only to samples taken after fasting for at least 8 hours.   BUN 55 (H) 8 - 23 mg/dL   Creatinine, Ser 6.64 (H) 0.61 - 1.24 mg/dL   Calcium 9.3 8.9 - 40.3 mg/dL   GFR, Estimated 19 (L) >60 mL/min    Comment: (NOTE) Calculated using the CKD-EPI Creatinine Equation (2021)    Anion gap 10 5 - 15    Comment: Performed at [facility], [address], [place], [place] [address]  Urinalysis, Microscopic (reflex)     Status: Abnormal   Collection Time: 09/28/23 10:55 AM  Result Value Ref Range   RBC / HPF >50 0 - 5 RBC/hpf   WBC, UA FIELD OBSCURED BY RBC'S 0 - 5 WBC/hpf   Bacteria, UA FIELD OBSCURED BY RBC'S (A) NONE SEEN   Squamous Epithelial / HPF FIELD OBSCURED BY RBC'S 0 - 5 /HPF    Comment: Performed at [facility], [address], [place], [place] [address]   No results found.  Pending Labs Unresulted Labs (From admission, onward)     Start     Ordered   09/29/23 0500  CBC  Tomorrow morning,   R        09/28/23 1357   09/29/23 0500  Comprehensive metabolic panel  Tomorrow morning,   R        09/28/23 1357            Vitals/Pain Today's Vitals   09/28/23 1003 09/28/23 1015 09/28/23 1144 09/28/23 1144  BP:      Pulse:  87    Resp:   18   Temp:   98 F (36.7 C)   SpO2:  100%    Weight:    75 kg  Height:    5\' 8"  (1.727 m)  PainSc: 10-Worst pain ever       Isolation Precautions No active isolations  Medications Medications  acetaminophen (TYLENOL) tablet 650 mg (has no administration in time range)    Or  acetaminophen (TYLENOL) suppository 650 mg (has no administration in time range)  ondansetron (ZOFRAN) tablet 4 mg (has no administration in time  range)    Or  ondansetron (ZOFRAN) injection 4 mg (has no administration in time range)    Mobility walks

## 2023-09-28 NOTE — Procedures (Signed)
Please see separate consult note.  Shared decision was made to proceed with three-way hematuria catheter placement, CBI irrigation, and hand irrigation as needed.  Patient was prepped and draped in usual sterile fashion.  Next, 10 cc of sterile lubricant with lidocaine was injected directly into the urethra.  After adequate time for anesthetic effect been provided a 72f coud hematuria catheter was advanced into the bladder without resistance.  There was immediate return of light pink clear urine.  The balloon was inflated with 30 cc of sterile water.  Catheter was placed to gravity drainage without dependent loops.  This concluded the procedure.  CBI was set to low gtt.  Hand irrigation did not return consolidated clot.

## 2023-09-28 NOTE — ED Provider Notes (Signed)
[place] EMERGENCY DEPARTMENT AT [facility] Provider Note   CSN: [id number] Arrival date & time: 09/28/23  2536     History  Chief Complaint  Patient presents with   Hematuria    [name] [name] is a 89 y.o. male.  Patient with a history of bladder cancer here with urinary retention and hematuria.  Statesshe occasionally passes clots with his urination but he believes there is a clot blocking his urethra currently.  Has not been able to urinate since 7 AM but has the urge to.  He was seen at [facility] urology yesterday and told to increase his fluids to help flush the clots.  He does not take any blood thinners.  No fever, chills, nausea or vomiting.  Complains of bladder pressure and lower abdominal pain.  No chest pain or shortness of breath.  No fever.  No testicular pain.  Has required a Foley catheter in the past.  The history is provided by the patient and the EMS personnel.  Hematuria Pertinent negatives include no chest pain, no abdominal pain, no headaches and no shortness of breath.       Home Medications Prior to Admission medications   Medication Sig Start Date End Date Taking? Authorizing Provider  acetaminophen (TYLENOL) 500 MG tablet Take 1,000 mg by mouth every 6 (six) hours as needed for moderate pain.    [provider]  amLODipine (NORVASC) 10 MG tablet Take 1 tablet (10 mg total) by mouth daily. Patient taking differently: Take 10 mg by mouth at bedtime. 08/23/20   Almon Hercules, MD  aspirin EC 81 MG tablet Take 81 mg by mouth daily. Swallow whole.    [provider]  ferrous sulfate 325 (65 FE) MG tablet Take 325 mg by mouth 2 (two) times daily with a meal.    [provider]  finasteride (PROSCAR) 5 MG tablet TAKE ONE TABLET BY MOUTH DAILY Patient taking differently: Take 5 mg by mouth at bedtime. 10/28/20   McKenzie, Mardene Celeste, MD  levocetirizine (XYZAL) 5 MG tablet 1 tablet in the evening for 7 days then daily as  needed Orally Once a day for 30 days 08/03/23   [provider]  levothyroxine (SYNTHROID, LEVOTHROID) 50 MCG tablet Take 50 mcg by mouth daily before breakfast. 12/25/15   [provider]  lidocaine-prilocaine (EMLA) cream Apply to affected area once 09/10/23   [name], MD  mirabegron ER (MYRBETRIQ) 50 MG TB24 tablet Take 50 mg by mouth at bedtime.    [provider]  NON FORMULARY Pt uses a cpap nightly    [provider]  ondansetron (ZOFRAN) 8 MG tablet Take 1 tablet (8 mg total) by mouth every 8 (eight) hours as needed for nausea or vomiting. Patient not taking: Reported on 09/16/2023 09/10/23   [name], MD  oxyCODONE (ROXICODONE) 5 MG immediate release tablet Take 1 tablet (5 mg total) by mouth every 6 (six) hours as needed for moderate pain or severe pain (Post-operatively). Patient not taking: Reported on 09/16/2023 07/21/23 07/20/24  Loletta Parish., MD  pantoprazole (PROTONIX) 20 MG tablet Take 20 mg by mouth at bedtime. 12/20/15   [provider]  prochlorperazine (COMPAZINE) 10 MG tablet Take 1 tablet (10 mg total) by mouth every 6 (six) hours as needed for nausea or vomiting. Patient not taking: Reported on 09/16/2023 09/10/23   [name], MD  psyllium (METAMUCIL) 58.6 % powder Take 0.5 packets by mouth every  evening.    [provider]  rosuvastatin (CRESTOR) 5 MG tablet Take 5 mg by mouth at bedtime.    [provider]  senna-docusate (SENOKOT-S) 8.6-50 MG tablet Take 1 tablet by mouth 2 (two) times daily. While taking strong pain meds to prevent constipation 09/04/22   Manny, Delbert Phenix., MD  silodosin (RAPAFLO) 8 MG CAPS capsule TAKE ONE CAPSULE BY MOUTH EVERY NIGHT AT BEDTIME 07/25/20   McKenzie, Mardene Celeste, MD  sodium bicarbonate 650 MG tablet 1 tablet Orally twice a day 11/05/22   [provider]      Allergies    Macrobid [nitrofurantoin], Ciprofloxacin, Tramadol hcl, Cephalexin, and  [name] [mitomycin]    Review of Systems   Review of Systems  Constitutional:  Negative for activity change, appetite change and fever.  HENT:  Negative for congestion and rhinorrhea.   Respiratory:  Negative for cough, chest tightness and shortness of breath.   Cardiovascular:  Negative for chest pain.  Gastrointestinal:  Negative for abdominal pain, nausea and vomiting.  Genitourinary:  Positive for decreased urine volume, difficulty urinating, dysuria and hematuria. Negative for urgency.  Musculoskeletal:  Negative for arthralgias and myalgias.  Skin:  Negative for rash.  Neurological:  Negative for dizziness, weakness and headaches.   all other systems are negative except as noted in the HPI and PMH.    Physical Exam Updated Vital Signs BP (!) 159/85   Pulse 87   Temp 98 F (36.7 C)   Resp 18   Ht 5\' 8"  (1.727 m)   Wt 75 kg   SpO2 100%   BMI 25.14 kg/m  Physical Exam Vitals and nursing note reviewed.  Constitutional:      General: He is not in acute distress.    Appearance: He is well-developed.  HENT:     Head: Normocephalic and atraumatic.     Mouth/Throat:     Pharynx: No oropharyngeal exudate.  Eyes:     Conjunctiva/sclera: Conjunctivae normal.     Pupils: Pupils are equal, round, and reactive to light.  Neck:     Comments: No meningismus. Cardiovascular:     Rate and Rhythm: Normal rate and regular rhythm.     Heart sounds: Normal heart sounds. No murmur heard. Pulmonary:     Effort: Pulmonary effort is normal. No respiratory distress.     Breath sounds: Normal breath sounds.  Abdominal:     Palpations: Abdomen is soft.     Tenderness: There is abdominal tenderness. There is no guarding or rebound.     Comments: Periumbilical tenderness. No guarding or rebound  Genitourinary:    Comments: No testicular tenderness Musculoskeletal:        General: No tenderness. Normal range of motion.     Cervical back: Normal range of motion and neck supple.  Skin:     General: Skin is warm.  Neurological:     Mental Status: He is alert and oriented to person, place, and time.     Cranial Nerves: No cranial nerve deficit.     Motor: No abnormal muscle tone.     Coordination: Coordination normal.     Comments:  5/5 strength throughout. CN 2-12 intact.Equal grip strength.   Psychiatric:        Behavior: Behavior normal.     ED Results / Procedures / Treatments   Labs (all labs ordered are listed, but only abnormal results are displayed) Labs Reviewed  URINALYSIS, ROUTINE W REFLEX MICROSCOPIC - Abnormal; Notable for the  following components:      Result Value   Color, Urine RED (*)    APPearance TURBID (*)    Glucose, UA   (*)    Value: TEST NOT REPORTED DUE TO COLOR INTERFERENCE OF URINE PIGMENT   Hgb urine dipstick   (*)    Value: TEST NOT REPORTED DUE TO COLOR INTERFERENCE OF URINE PIGMENT   Bilirubin Urine   (*)    Value: TEST NOT REPORTED DUE TO COLOR INTERFERENCE OF URINE PIGMENT   Ketones, ur   (*)    Value: TEST NOT REPORTED DUE TO COLOR INTERFERENCE OF URINE PIGMENT   Protein, ur   (*)    Value: TEST NOT REPORTED DUE TO COLOR INTERFERENCE OF URINE PIGMENT   Nitrite   (*)    Value: TEST NOT REPORTED DUE TO COLOR INTERFERENCE OF URINE PIGMENT   Leukocytes,Ua   (*)    Value: TEST NOT REPORTED DUE TO COLOR INTERFERENCE OF URINE PIGMENT   All other components within normal limits  CBC WITH DIFFERENTIAL/PLATELET - Abnormal; Notable for the following components:   RBC 3.55 (*)    Hemoglobin 10.8 (*)    HCT 32.4 (*)    Lymphs Abs 0.4 (*)    All other components within normal limits  BASIC METABOLIC PANEL - Abnormal; Notable for the following components:   CO2 21 (*)    Glucose, Bld 104 (*)    BUN 55 (*)    Creatinine, Ser 2.97 (*)    GFR, Estimated 19 (*)    All other components within normal limits  URINALYSIS, MICROSCOPIC (REFLEX) - Abnormal; Notable for the following components:   Bacteria, UA FIELD OBSCURED BY RBC'S (*)    All  other components within normal limits    EKG None  Radiology No results found.  Procedures Procedures    Medications Ordered in ED Medications - No data to display  ED Course/ Medical Decision Making/ A&P                                 Medical Decision Making Amount and/or Complexity of Data Reviewed Independent Historian: EMS Labs: ordered. Decision-making details documented in ED Course. Radiology: ordered and independent interpretation performed. Decision-making details documented in ED Course. ECG/medicine tests: ordered and independent interpretation performed. Decision-making details documented in ED Course.  Risk Decision regarding hospitalization.   Bladder cancer here with inability urinate since 7 AM with clots and hematuria.  Vital stable.  No distress.  Abdomen soft without peritoneal signs  Bladder scan 220 cc.  Patient able to urinate on his own 250 cc of urine.  Patient was able to urinate several blood clots with grossly bloody urine.  His hemoglobin is 10.8 at baseline.  Creatinine 2.97 also at baseline  Patient was able to urinate grossly bloody urine with clots.  Had about 85 cc of residual urine.  Discussed with urology NP [place]..  Will plan hematuria catheter for bladder irrigation and CBI.  CT scan does show some clot in the bladder.  Patient does have solitary kidney and CKD and cannot tolerate infection for very long.  Admission d/w Dr. Robb Matar.        Final Clinical Impression(s) / ED Diagnoses Final diagnoses:  Gross hematuria  Urinary retention    Rx / DC Orders ED Discharge Orders     None         Tareek Sabo, Jeannett Senior, MD 09/28/23  1344  

## 2023-09-29 ENCOUNTER — Encounter (HOSPITAL_COMMUNITY): Payer: Self-pay | Admitting: Internal Medicine

## 2023-09-29 DIAGNOSIS — Z9079 Acquired absence of other genital organ(s): Secondary | ICD-10-CM | POA: Diagnosis not present

## 2023-09-29 DIAGNOSIS — D62 Acute posthemorrhagic anemia: Secondary | ICD-10-CM

## 2023-09-29 DIAGNOSIS — J45909 Unspecified asthma, uncomplicated: Secondary | ICD-10-CM | POA: Diagnosis present

## 2023-09-29 DIAGNOSIS — R339 Retention of urine, unspecified: Secondary | ICD-10-CM | POA: Diagnosis present

## 2023-09-29 DIAGNOSIS — R7303 Prediabetes: Secondary | ICD-10-CM | POA: Diagnosis present

## 2023-09-29 DIAGNOSIS — Z888 Allergy status to other drugs, medicaments and biological substances status: Secondary | ICD-10-CM | POA: Diagnosis not present

## 2023-09-29 DIAGNOSIS — N3289 Other specified disorders of bladder: Secondary | ICD-10-CM | POA: Diagnosis not present

## 2023-09-29 DIAGNOSIS — H9190 Unspecified hearing loss, unspecified ear: Secondary | ICD-10-CM | POA: Diagnosis present

## 2023-09-29 DIAGNOSIS — Z79899 Other long term (current) drug therapy: Secondary | ICD-10-CM | POA: Diagnosis not present

## 2023-09-29 DIAGNOSIS — C679 Malignant neoplasm of bladder, unspecified: Secondary | ICD-10-CM | POA: Diagnosis not present

## 2023-09-29 DIAGNOSIS — Z881 Allergy status to other antibiotic agents status: Secondary | ICD-10-CM | POA: Diagnosis not present

## 2023-09-29 DIAGNOSIS — Z885 Allergy status to narcotic agent status: Secondary | ICD-10-CM | POA: Diagnosis not present

## 2023-09-29 DIAGNOSIS — Z85828 Personal history of other malignant neoplasm of skin: Secondary | ICD-10-CM | POA: Diagnosis not present

## 2023-09-29 DIAGNOSIS — Z860100 Personal history of colon polyps, unspecified: Secondary | ICD-10-CM | POA: Diagnosis not present

## 2023-09-29 DIAGNOSIS — Z8249 Family history of ischemic heart disease and other diseases of the circulatory system: Secondary | ICD-10-CM | POA: Diagnosis not present

## 2023-09-29 DIAGNOSIS — C678 Malignant neoplasm of overlapping sites of bladder: Secondary | ICD-10-CM

## 2023-09-29 DIAGNOSIS — I10 Essential (primary) hypertension: Secondary | ICD-10-CM | POA: Diagnosis not present

## 2023-09-29 DIAGNOSIS — I129 Hypertensive chronic kidney disease with stage 1 through stage 4 chronic kidney disease, or unspecified chronic kidney disease: Secondary | ICD-10-CM | POA: Diagnosis not present

## 2023-09-29 DIAGNOSIS — D090 Carcinoma in situ of bladder: Secondary | ICD-10-CM | POA: Diagnosis not present

## 2023-09-29 DIAGNOSIS — E039 Hypothyroidism, unspecified: Secondary | ICD-10-CM | POA: Diagnosis not present

## 2023-09-29 DIAGNOSIS — E782 Mixed hyperlipidemia: Secondary | ICD-10-CM | POA: Diagnosis not present

## 2023-09-29 DIAGNOSIS — Z9842 Cataract extraction status, left eye: Secondary | ICD-10-CM | POA: Diagnosis not present

## 2023-09-29 DIAGNOSIS — R338 Other retention of urine: Secondary | ICD-10-CM | POA: Diagnosis not present

## 2023-09-29 DIAGNOSIS — N401 Enlarged prostate with lower urinary tract symptoms: Secondary | ICD-10-CM | POA: Diagnosis present

## 2023-09-29 DIAGNOSIS — K219 Gastro-esophageal reflux disease without esophagitis: Secondary | ICD-10-CM | POA: Diagnosis present

## 2023-09-29 DIAGNOSIS — N184 Chronic kidney disease, stage 4 (severe): Secondary | ICD-10-CM

## 2023-09-29 DIAGNOSIS — G4730 Sleep apnea, unspecified: Secondary | ICD-10-CM | POA: Diagnosis present

## 2023-09-29 DIAGNOSIS — Z905 Acquired absence of kidney: Secondary | ICD-10-CM | POA: Diagnosis not present

## 2023-09-29 DIAGNOSIS — Z8551 Personal history of malignant neoplasm of bladder: Secondary | ICD-10-CM | POA: Diagnosis not present

## 2023-09-29 DIAGNOSIS — Z9841 Cataract extraction status, right eye: Secondary | ICD-10-CM | POA: Diagnosis not present

## 2023-09-29 DIAGNOSIS — R310 Gross hematuria: Secondary | ICD-10-CM | POA: Diagnosis not present

## 2023-09-29 DIAGNOSIS — N183 Chronic kidney disease, stage 3 unspecified: Secondary | ICD-10-CM | POA: Diagnosis not present

## 2023-09-29 DIAGNOSIS — D494 Neoplasm of unspecified behavior of bladder: Secondary | ICD-10-CM | POA: Diagnosis not present

## 2023-09-29 DIAGNOSIS — N189 Chronic kidney disease, unspecified: Secondary | ICD-10-CM | POA: Diagnosis not present

## 2023-09-29 LAB — CBC
HCT: 28.8 % — ABNORMAL LOW (ref 39.0–52.0)
Hemoglobin: 9.4 g/dL — ABNORMAL LOW (ref 13.0–17.0)
MCH: 30.3 pg (ref 26.0–34.0)
MCHC: 32.6 g/dL (ref 30.0–36.0)
MCV: 92.9 fL (ref 80.0–100.0)
Platelets: 235 10*3/uL (ref 150–400)
RBC: 3.1 MIL/uL — ABNORMAL LOW (ref 4.22–5.81)
RDW: 14.3 % (ref 11.5–15.5)
WBC: 7.3 10*3/uL (ref 4.0–10.5)
nRBC: 0 % (ref 0.0–0.2)

## 2023-09-29 LAB — COMPREHENSIVE METABOLIC PANEL
ALT: 14 U/L (ref 0–44)
AST: 13 U/L — ABNORMAL LOW (ref 15–41)
Albumin: 3.4 g/dL — ABNORMAL LOW (ref 3.5–5.0)
Alkaline Phosphatase: 57 U/L (ref 38–126)
Anion gap: 10 (ref 5–15)
BUN: 53 mg/dL — ABNORMAL HIGH (ref 8–23)
CO2: 21 mmol/L — ABNORMAL LOW (ref 22–32)
Calcium: 8.8 mg/dL — ABNORMAL LOW (ref 8.9–10.3)
Chloride: 109 mmol/L (ref 98–111)
Creatinine, Ser: 2.96 mg/dL — ABNORMAL HIGH (ref 0.61–1.24)
GFR, Estimated: 20 mL/min — ABNORMAL LOW (ref >60)
Glucose, Bld: 101 mg/dL — ABNORMAL HIGH (ref 70–99)
Potassium: 3.9 mmol/L (ref 3.5–5.1)
Sodium: 140 mmol/L (ref 135–145)
Total Bilirubin: 0.7 mg/dL (ref <1.2)
Total Protein: 6.5 g/dL (ref 6.5–8.1)

## 2023-09-29 MED ORDER — CHLORHEXIDINE GLUCONATE CLOTH 2 % EX PADS
6.0000 | MEDICATED_PAD | Freq: Every day | CUTANEOUS | Status: DC
  Administered 2023-09-29 – 2023-10-01 (×3): 6 via TOPICAL

## 2023-09-29 NOTE — Care Management Obs Status (Signed)
MEDICARE OBSERVATION STATUS NOTIFICATION   Patient Details  Name: [name] [name] MRN: [id number] Date of Birth: 29-May-1932   Medicare Observation Status Notification Given:  Yes    Larrie Kass, LCSW 09/29/2023, 12:32 PM

## 2023-09-29 NOTE — Assessment & Plan Note (Signed)
09-29-2023 continue synthroid. 09-30-2023 stable

## 2023-09-29 NOTE — Assessment & Plan Note (Addendum)
09-29-2023 remains on CBI per urology. 09-30-2023 urology notes reviewed. Plans for cysto tomorrow. NPO after MN. Type and screen should be good until midnight tomorrow night if he needs blood. 10-01-2023 for cysto today. Possible resection of more tumor. 10-02-2023 s/p cysto yesterday with more tumor removal and fulguration. Ready for DC today.

## 2023-09-29 NOTE — Assessment & Plan Note (Addendum)
09-29-2023 stable. Baseline Scr 3.0 09-30-2023 repeat CMP in AM. 10-01-2023 Scr stable at 2.95 10-02-2023 DC Scr 3.22

## 2023-09-29 NOTE — Subjective & Objective (Addendum)
Pt seen and examined.  Scheduled for cysto today CBI still running. Pink colored urine in foley bag.

## 2023-09-29 NOTE — Progress Notes (Addendum)
Subjective: [name] [name].  Met patient in the room with Dr. Arita [name].  Dr. Berneice Heinrich will stop in to see him later today.  Tolerating catheter/CBI well.  Some areas of clear irrigant in tubing-intermittent bleeding  Objective: Vital signs in last 24 hours: Temp:  [97.6 F (36.4 C)-98 F (36.7 C)] 97.8 F (36.6 C) (11/06 0458) Pulse Rate:  [70-78] 74 (11/06 0458) Resp:  [12-19] 15 (11/06 0458) BP: (103-140)/(54-68) 111/54 (11/06 0458) SpO2:  [97 %-100 %] 99 % (11/06 0458)  Assessment/Plan: # Hematuria # S/p TURBT plus BCG x 6 # Left ureterectomy failing [name]  Hematuria catheter with initiation of CBI and hand irrigation in the ED on 09/28/2023. The amount of clot material passed and hand irrigated and total does not represent the amount of material at the bladder neck reflected on CT A/P.  This may represent a rapid recurrence of his bladder cancer.  Discussed with his surgeon Dr. Berneice Heinrich who will take him to the OR on Friday for cystoscopy with TURBT, clot evacuation and fulguration if needed. Continue to trend hemoglobin.  Intake/Output from previous day: 11/05 0701 - 11/06 0700 In: 3800  Out: 9025 [Urine:9025]  Intake/Output this shift: Total I/O In: 120 [P.O.:120] Out: 1500 [Urine:1500]  Physical Exam:  General: Alert and oriented CV: No cyanosis Lungs: equal chest rise Abdomen: Soft, NTND, no rebound or guarding Skin: Gu: Three-way Foley catheter in place.  CBI on low gtt.  Pink lemonade urine/irrigant in tubing with some clear areas intermittently  Lab Results: Recent Labs    09/28/23 1713 09/28/23 2133 09/29/23 0406  HGB 11.0* 9.7* 9.4*  HCT 33.2* 29.9* 28.8*   BMET Recent Labs    09/28/23 1055 09/29/23 0406  NA 138 140  K 4.1 3.9  CL 107 109  CO2 21* 21*  GLUCOSE 104* 101*  BUN 55* 53*  CREATININE 2.97* 2.96*  CALCIUM 9.3 8.8*     Studies/Results: CT Renal Stone Study  Result Date: 09/28/2023 CLINICAL DATA:  Hematuria. 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 EXAM: CT ABDOMEN AND PELVIS WITHOUT CONTRAST TECHNIQUE: Multidetector CT imaging of the abdomen and pelvis was performed following the standard protocol without IV contrast. RADIATION DOSE REDUCTION: This exam was performed according to the departmental dose-optimization program which includes automated exposure control, adjustment of the mA and/or kV according to patient size and/or use of iterative reconstruction technique. COMPARISON:  CT abdomen/pelvis dated March 17, 2023. FINDINGS: Lower chest: No acute abnormality. Hepatobiliary: No suspicious focal liver lesion, within the limits of a noncontrast exam. Probable small calcified gallstone. No gallbladder wall thickening. No biliary dilatation. Pancreas: Unremarkable. No pancreatic ductal dilatation or surrounding inflammatory changes. Spleen: Normal in size without focal abnormality. Adrenals/Urinary Tract: The adrenal glands are unremarkable. Status post left nephrectomy. No suspicious focal soft tissue or collection to suggest recurrence. Normal noncontrast appearance of the right kidney. No suspicious focal lesion, within the limits of an noncontrast exam. Punctate nonobstructive 2 mm calculus in the inferior pole of the right kidney. No hydronephrosis. There is mild haziness and bladder wall thickening at the superior bladder dome. Stomach/Bowel: Stomach is within normal limits. Appendix appears normal. No evidence of obstruction. Descending and sigmoid colonic diverticulosis without evidence of acute diverticulitis. Vascular/Lymphatic: Aortic atherosclerosis. Similar prominent left periaortic nodes, not definitively enlarged by size criteria. Reproductive: Prostatomegaly.  Evidence of prior TURP. Other: Small fat containing umbilical hernia. No abdominopelvic ascites. Musculoskeletal: No acute osseous abnormality. Severe degenerative changes of the right hip resulting in  bone-on-bone contact, subchondral sclerosis and prominent  subchondral cystic changes. Moderate osteoarthritis of the left hip. Multilevel degenerative changes of the spine. IMPRESSION: 1. Status post left nephrectomy. No evidence of recurrence identified within the limits of a noncontrast examination. 2. Punctate nonobstructive 2 mm stone in the inferior pole of the right kidney. No hydronephrosis. 3. Similar prominent left periaortic nodes, not definitively enlarged by size criteria, are nonspecific. 4. Descending and sigmoid colonic diverticulosis without evidence of acute diverticulitis. Electronically Signed   By: Hart Robinsons M.D.   On: 09/28/2023 15:45      LOS: 0 days   [name], NP [facility] Pager: [phone]  09/29/2023, 12:09 PM   Pt seen and examined. Greaty appreciate all team's comanagement.   Briefly,  S: Readmitted with gross hematuria / clot retentin, h/o multifocal and recurrent urothelial carcinoma s/p TURBT most recetnly 07/21/23 and started Keytruda (1 cycle). 3 way catheter placd but does have some persistant ongoing bleeding, not transfusion dependant.  O: NAD, Pleasant as always, appears MUCH younger than stated age Wearing glasses Non-labored breathing on RA Prior surgical sites with reducible hernis upper midline 3 way foley in place on slow irrigation with efflux light pink, no large clots NO c/c/e  Hgb 9's, Cr 3's.  A/P: I am concerned he may have rapidly recurrent bladder cancer as source of bleeding. Discussed options and rec OR  Friday for cysto, clot evac, possible TURBT/fulgeration with goal of determining source of bleeding and helping stop. He is in complete agreement. Risks, benefits, alternaitves, peri-op course discussed.

## 2023-09-29 NOTE — Assessment & Plan Note (Signed)
09-29-2023 pt states he no longer wear CPAP at night.

## 2023-09-29 NOTE — Progress Notes (Signed)
   09/29/23 2300  BiPAP/CPAP/SIPAP  BiPAP/CPAP/SIPAP Pt Type Adult  Reason BIPAP/CPAP not in use Non-compliant (Pt refusing)

## 2023-09-29 NOTE — Assessment & Plan Note (Addendum)
09-29-2023 follow Hgb. Pt does not need any IV meds at this point. Pt aware he may need PRBC transfusion. He denies being jehovah's witness. He agrees to accept PRBC transfusion if needed.  09-30-2023 will repeat CBC in AM. Type and screen good until 2359 on 10-01-2023. 10-01-2023 HgB stable. Does not require PRBC transfusion at this time 10-02-2023 DC HgB 9.7 g/dl

## 2023-09-29 NOTE — Progress Notes (Signed)
PROGRESS NOTE    [name] [name]  VPX:[id number] DOB: 1932-06-10 DOA: 09/28/2023 PCP: [name], MD  Subjective: Pt seen and examined. Pt states he has had numerous bouts of gross hematuria in the past. Some requiring CBI. Some requiring fulguration.     Hospital Course: HPI:  [name] [name] is a 89 y.o. male with medical history significant of anemia, osteoarthritis, asthma, bladder cancer, multiple episodes of hematuria, status post TURP, BPH, stage IV CKD, dyspnea, fatigue, GERD, hyperlipidemia, impaired hearing, hypertension, hypothyroidism, pigmented basal cell carcinoma, prediabetes, history of UTI who presented to the emergency department with complaints of gross hematuria since early morning and urinary retention since around 0700.  No flank pain.  He denied fever, chills, rhinorrhea, sore throat, wheezing or hemoptysis.  No chest pain, palpitations, diaphoresis, PND, orthopnea or pitting edema of the lower extremities.  No abdominal pain, nausea, emesis, diarrhea, constipation, melena or hematochezia.  No polyuria, polydipsia, polyphagia or blurred vision.    Lab work: Urinalysis was extremely red and turbid with unreliable results due to pigment interference.  CBC showed a white count of 7.2, hemoglobin 10.8 g/dL platelets 462.  BMP showed CO2 of 21 mmol/L with a normal anion gap, the rest of the electrolytes are normal.  Glucose 104, BUN 55 creatinine 2.97 mg/dL.  His creatinine level is around baseline.   Imaging: CT renal study shows status post left nephrectomy.  No evidence of recurrence identified within the limits of a noncontrast examination.  Punctate nonobstructing 2 mm stone in the inferior pole of the right kidney.  No hydronephrosis.  Similar prominent left periaortic nodes, nonspecific.  Colon diverticulosis without diverticulitis.   ED course: Initial vital signs were temperature 98 F, pulse 87, respiration 18, BP 159/85 mmHg O2 sat 100% on room air.   Urology placed a three-way hematuria catheter for CBI irrigation.  Significant Events: Admitted 09/28/2023 for gross hematuria 09-28-2023 hematuria catheter placed by urology  Significant Labs: Admission HgB 10.8, Scr 2.97 09-29-2023 HgB 9.4  Significant Imaging Studies: Admission CT renal stone Status post left nephrectomy. No evidence of recurrence identified within the limits of a noncontrast examination. 2. Punctate nonobstructive 2 mm stone in the inferior pole of the right kidney. No hydronephrosis. 3. Similar prominent left periaortic nodes, not definitively enlarged by size criteria, are nonspecific. 4. Descending and sigmoid colonic diverticulosis without evidence of acute diverticulitis.  Antibiotic Therapy: Anti-infectives (From admission, onward)    None       Procedures: 09-28-2023 insertion hematuria catheter  Consultants: 09-28-2023 urology    Assessment and Plan: * Gross hematuria 09-29-2023 remains on CBI per urology.  Acute blood loss anemia (ABLA) 09-29-2023 follow Hgb. Pt does not need any IV meds at this point. Pt aware he may need PRBC transfusion. He denies being jehovah's witness. He agrees to accept PRBC transfusion if needed.  Hypothyroidism 09-29-2023 continue synthroid.  Prediabetes 09-29-2023 stable.  Mixed hyperlipidemia 09-29-2023 stable.  GERD (gastroesophageal reflux disease) 09-29-2023 remains on ppi and bicarb  Essential hypertension 09-29-2023 stable.  Sleep apnea treated with nocturnal BiPAP 09-29-2023 pt states he no longer wear CPAP at night.  Malignant neoplasm of urinary bladder (HCC) Management per urology  CKD (chronic kidney disease) stage 4, GFR 15-29 ml/min (HCC) - Baseline Scr 3.0 09-29-2023 stable. Baseline Scr 3.0   DVT prophylaxis: SCDs Start: 09/28/23 1356     Code Status: Full Code Family Communication: no family at bedside Disposition Plan: return home Reason for continuing need for hospitalization: remains on  CBI  Objective: Vitals:   09/28/23 2050 09/29/23 0058 09/29/23 0458 09/29/23 1217  BP: 120/68 (!) 103/55 (!) 111/54 (!) 114/59  Pulse: 75 70 74 66  Resp: 16 16 15 17   Temp: 97.7 F (36.5 C) 98 F (36.7 C) 97.8 F (36.6 C) 97.8 F (36.6 C)  TempSrc: Oral Oral Oral Oral  SpO2: 100% 97% 99% 100%  Weight:      Height:        Intake/Output Summary (Last 24 hours) at 09/29/2023 1238 Last data filed at 09/29/2023 1010 Gross per 24 hour  Intake 3920 ml  Output 24401 ml  Net -6605 ml   Filed Weights   09/28/23 1144  Weight: 75 kg    Examination:  Physical Exam Vitals and nursing note reviewed.  Constitutional:      General: He is not in acute distress.    Appearance: He is normal weight. He is not toxic-appearing or diaphoretic.  HENT:     Head: Normocephalic and atraumatic.     Nose: Nose normal.  Eyes:     General: No scleral icterus. Cardiovascular:     Rate and Rhythm: Normal rate and regular rhythm.  Pulmonary:     Effort: Pulmonary effort is normal.     Breath sounds: Normal breath sounds.  Abdominal:     General: Bowel sounds are normal. There is no distension.     Palpations: Abdomen is soft.  Genitourinary:    Comments: CBI with pink urine in catheter tubing Musculoskeletal:     Right lower leg: No edema.     Left lower leg: No edema.  Skin:    General: Skin is warm and dry.     Capillary Refill: Capillary refill takes less than 2 seconds.  Neurological:     Mental Status: He is alert.     Data Reviewed: I have personally reviewed following labs and imaging studies  CBC: Recent Labs  Lab 09/28/23 1055 09/28/23 1713 09/28/23 2133 09/29/23 0406  WBC 7.2  --   --  7.3  NEUTROABS 6.0  --   --   --   HGB 10.8* 11.0* 9.7* 9.4*  HCT 32.4* 33.2* 29.9* 28.8*  MCV 91.3  --   --  92.9  PLT 257  --   --  235   Basic Metabolic Panel: Recent Labs  Lab 09/28/23 1055 09/29/23 0406  NA 138 140  K 4.1 3.9  CL 107 109  CO2 21* 21*  GLUCOSE 104* 101*   BUN 55* 53*  CREATININE 2.97* 2.96*  CALCIUM 9.3 8.8*   GFR: Estimated Creatinine Clearance: 16.4 mL/min (A) (by C-G formula based on SCr of 2.96 mg/dL (H)). Liver Function Tests: Recent Labs  Lab 09/29/23 0406  AST 13*  ALT 14  ALKPHOS 57  BILITOT 0.7  PROT 6.5  ALBUMIN 3.4*    Radiology Studies: CT Renal Stone Study  Result Date: 09/28/2023 CLINICAL DATA:  Hematuria. 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 EXAM: CT ABDOMEN AND PELVIS WITHOUT CONTRAST TECHNIQUE: Multidetector CT imaging of the abdomen and pelvis was performed following the standard protocol without IV contrast. RADIATION DOSE REDUCTION: This exam was performed according to the departmental dose-optimization program which includes automated exposure control, adjustment of the mA and/or kV according to patient size and/or use of iterative reconstruction technique. COMPARISON:  CT abdomen/pelvis dated March 17, 2023. FINDINGS: Lower chest: No acute abnormality. Hepatobiliary: No suspicious focal liver lesion, within the limits of a noncontrast exam. Probable small calcified  gallstone. No gallbladder wall thickening. No biliary dilatation. Pancreas: Unremarkable. No pancreatic ductal dilatation or surrounding inflammatory changes. Spleen: Normal in size without focal abnormality. Adrenals/Urinary Tract: The adrenal glands are unremarkable. Status post left nephrectomy. No suspicious focal soft tissue or collection to suggest recurrence. Normal noncontrast appearance of the right kidney. No suspicious focal lesion, within the limits of an noncontrast exam. Punctate nonobstructive 2 mm calculus in the inferior pole of the right kidney. No hydronephrosis. There is mild haziness and bladder wall thickening at the superior bladder dome. Stomach/Bowel: Stomach is within normal limits. Appendix appears normal. No evidence of obstruction. Descending and sigmoid colonic diverticulosis without evidence of acute diverticulitis.  Vascular/Lymphatic: Aortic atherosclerosis. Similar prominent left periaortic nodes, not definitively enlarged by size criteria. Reproductive: Prostatomegaly.  Evidence of prior TURP. Other: Small fat containing umbilical hernia. No abdominopelvic ascites. Musculoskeletal: No acute osseous abnormality. Severe degenerative changes of the right hip resulting in bone-on-bone contact, subchondral sclerosis and prominent subchondral cystic changes. Moderate osteoarthritis of the left hip. Multilevel degenerative changes of the spine. IMPRESSION: 1. Status post left nephrectomy. No evidence of recurrence identified within the limits of a noncontrast examination. 2. Punctate nonobstructive 2 mm stone in the inferior pole of the right kidney. No hydronephrosis. 3. Similar prominent left periaortic nodes, not definitively enlarged by size criteria, are nonspecific. 4. Descending and sigmoid colonic diverticulosis without evidence of acute diverticulitis. Electronically Signed   By: Hart Robinsons M.D.   On: 09/28/2023 15:45    Scheduled Meds:  amLODipine  10 mg Oral QHS   Chlorhexidine Gluconate Cloth  6 each Topical Daily   ferrous sulfate  325 mg Oral BID WC   finasteride  5 mg Oral QHS   levothyroxine  50 mcg Oral Q0600   pantoprazole  20 mg Oral QHS   rosuvastatin  5 mg Oral QHS   sodium bicarbonate  650 mg Oral BID   tamsulosin  0.4 mg Oral QHS   Continuous Infusions:   LOS: 0 days   Time spent: 45 minutes  Carollee Herter, DO  [facility]  09/29/2023, 12:38 PM

## 2023-09-29 NOTE — Assessment & Plan Note (Signed)
09-29-2023 stable. 09-30-2023 stable

## 2023-09-29 NOTE — Assessment & Plan Note (Signed)
09-29-2023 remains on ppi and bicarb 09-30-2023 stable

## 2023-09-29 NOTE — Assessment & Plan Note (Addendum)
Management per urology 09-30-2023 plan for cysto in OR tomorrow. 10-01-2023 to OR today 10-02-2023 more biopsy taken yesterday during surgery. Path pending.

## 2023-09-29 NOTE — Hospital Course (Addendum)
HPI:  [name] [name] is a 89 y.o. male with medical history significant of anemia, osteoarthritis, asthma, bladder cancer, multiple episodes of hematuria, status post TURP, BPH, stage IV CKD, dyspnea, fatigue, GERD, hyperlipidemia, impaired hearing, hypertension, hypothyroidism, pigmented basal cell carcinoma, prediabetes, history of UTI who presented to the emergency department with complaints of gross hematuria since early morning and urinary retention since around 0700.  No flank pain.  He denied fever, chills, rhinorrhea, sore throat, wheezing or hemoptysis.  No chest pain, palpitations, diaphoresis, PND, orthopnea or pitting edema of the lower extremities.  No abdominal pain, nausea, emesis, diarrhea, constipation, melena or hematochezia.  No polyuria, polydipsia, polyphagia or blurred vision.    Lab work: Urinalysis was extremely red and turbid with unreliable results due to pigment interference.  CBC showed a white count of 7.2, hemoglobin 10.8 g/dL platelets 098.  BMP showed CO2 of 21 mmol/L with a normal anion gap, the rest of the electrolytes are normal.  Glucose 104, BUN 55 creatinine 2.97 mg/dL.  His creatinine level is around baseline.   Imaging: CT renal study shows status post left nephrectomy.  No evidence of recurrence identified within the limits of a noncontrast examination.  Punctate nonobstructing 2 mm stone in the inferior pole of the right kidney.  No hydronephrosis.  Similar prominent left periaortic nodes, nonspecific.  Colon diverticulosis without diverticulitis.   ED course: Initial vital signs were temperature 98 F, pulse 87, respiration 18, BP 159/85 mmHg O2 sat 100% on room air.  Urology placed a three-way hematuria catheter for CBI irrigation.  Significant Events: Admitted 09/28/2023 for gross hematuria 09-28-2023 hematuria catheter placed by urology  Significant Labs: Admission HgB 10.8, Scr 2.97 09-29-2023 HgB 9.4 Discharge HgB 9.7 g/ld, Scr 3.22  Significant  Imaging Studies: Admission CT renal stone Status post left nephrectomy. No evidence of recurrence identified within the limits of a noncontrast examination. 2. Punctate nonobstructive 2 mm stone in the inferior pole of the right kidney. No hydronephrosis. 3. Similar prominent left periaortic nodes, not definitively enlarged by size criteria, are nonspecific. 4. Descending and sigmoid colonic diverticulosis without evidence of acute diverticulitis.  Antibiotic Therapy: Anti-infectives (From admission, onward)    None       Procedures: 09-28-2023 insertion hematuria catheter 10-01-2023 cystoscopy  Consultants: 09-28-2023 urology

## 2023-09-29 NOTE — Assessment & Plan Note (Addendum)
09-29-2023 stable. 09-30-2023 stable 10-01-2023 stable. Continue norvasc 10 mg at bedtime.

## 2023-09-30 DIAGNOSIS — E782 Mixed hyperlipidemia: Secondary | ICD-10-CM

## 2023-09-30 DIAGNOSIS — I10 Essential (primary) hypertension: Secondary | ICD-10-CM | POA: Diagnosis not present

## 2023-09-30 DIAGNOSIS — R310 Gross hematuria: Secondary | ICD-10-CM | POA: Diagnosis not present

## 2023-09-30 DIAGNOSIS — D62 Acute posthemorrhagic anemia: Secondary | ICD-10-CM | POA: Diagnosis not present

## 2023-09-30 DIAGNOSIS — N184 Chronic kidney disease, stage 4 (severe): Secondary | ICD-10-CM | POA: Diagnosis not present

## 2023-09-30 NOTE — Progress Notes (Signed)
PROGRESS NOTE    [name] [name]  VWU:[id number] DOB: 1932-09-14 DOA: 09/28/2023 PCP: [name], MD  Subjective: Pt seen and examined. Pt's friend Dierdre Highman at bedside. Pt gives permission to speak to him in front of his friend.  Urology note reviewed. Plan to take pt to OR tomorrow for cysto.  Pt remains on CBI. Foley tubing still with pink colored urine.  Otherwise pt feels ok.   Hospital Course: HPI:  [name] [name] is a 89 y.o. male with medical history significant of anemia, osteoarthritis, asthma, bladder cancer, multiple episodes of hematuria, status post TURP, BPH, stage IV CKD, dyspnea, fatigue, GERD, hyperlipidemia, impaired hearing, hypertension, hypothyroidism, pigmented basal cell carcinoma, prediabetes, history of UTI who presented to the emergency department with complaints of gross hematuria since early morning and urinary retention since around 0700.  No flank pain.  He denied fever, chills, rhinorrhea, sore throat, wheezing or hemoptysis.  No chest pain, palpitations, diaphoresis, PND, orthopnea or pitting edema of the lower extremities.  No abdominal pain, nausea, emesis, diarrhea, constipation, melena or hematochezia.  No polyuria, polydipsia, polyphagia or blurred vision.    Lab work: Urinalysis was extremely red and turbid with unreliable results due to pigment interference.  CBC showed a white count of 7.2, hemoglobin 10.8 g/dL platelets 295.  BMP showed CO2 of 21 mmol/L with a normal anion gap, the rest of the electrolytes are normal.  Glucose 104, BUN 55 creatinine 2.97 mg/dL.  His creatinine level is around baseline.   Imaging: CT renal study shows status post left nephrectomy.  No evidence of recurrence identified within the limits of a noncontrast examination.  Punctate nonobstructing 2 mm stone in the inferior pole of the right kidney.  No hydronephrosis.  Similar prominent left periaortic nodes, nonspecific.  Colon diverticulosis without  diverticulitis.   ED course: Initial vital signs were temperature 98 F, pulse 87, respiration 18, BP 159/85 mmHg O2 sat 100% on room air.  Urology placed a three-way hematuria catheter for CBI irrigation.  Significant Events: Admitted 09/28/2023 for gross hematuria 09-28-2023 hematuria catheter placed by urology  Significant Labs: Admission HgB 10.8, Scr 2.97 09-29-2023 HgB 9.4  Significant Imaging Studies: Admission CT renal stone Status post left nephrectomy. No evidence of recurrence identified within the limits of a noncontrast examination. 2. Punctate nonobstructive 2 mm stone in the inferior pole of the right kidney. No hydronephrosis. 3. Similar prominent left periaortic nodes, not definitively enlarged by size criteria, are nonspecific. 4. Descending and sigmoid colonic diverticulosis without evidence of acute diverticulitis.  Antibiotic Therapy: Anti-infectives (From admission, onward)    None       Procedures: 09-28-2023 insertion hematuria catheter  Consultants: 09-28-2023 urology    Assessment and Plan: * Gross hematuria 09-29-2023 remains on CBI per urology.  09-30-2023 urology notes reviewed. Plans for cysto tomorrow. NPO after MN. Type and screen should be good until midnight tomorrow night if he needs blood.  Acute blood loss anemia (ABLA) 09-29-2023 follow Hgb. Pt does not need any IV meds at this point. Pt aware he may need PRBC transfusion. He denies being jehovah's witness. He agrees to accept PRBC transfusion if needed.  09-30-2023 will repeat CBC in AM. Type and screen good until 2359 on 10-01-2023.  Hypothyroidism 09-29-2023 continue synthroid. 09-30-2023 stable  Prediabetes 09-29-2023 stable. 09-30-2023 stable  Mixed hyperlipidemia 09-29-2023 stable. 09-30-2023 stable  GERD (gastroesophageal reflux disease) 09-29-2023 remains on ppi and bicarb 09-30-2023 stable  Essential hypertension 09-29-2023 stable. 09-30-2023 stable  Sleep apnea treated  with  nocturnal BiPAP 09-29-2023 pt states he no longer wear CPAP at night.  Malignant neoplasm of urinary bladder [facility]) Management per urology 09-30-2023 plan for cysto in OR tomorrow.  CKD (chronic kidney disease) stage 4, GFR 15-29 ml/min (HCC) - Baseline Scr 3.0 09-29-2023 stable. Baseline Scr 3.0  09-30-2023 repeat CMP in AM.  DVT prophylaxis: SCDs Start: 09/28/23 1356    Code Status: Full Code Family Communication: no family at bedside. Pt gave permission to talk in front of his friend Dierdre Highman Disposition Plan: return home Reason for continuing need for hospitalization: needs cystoscopy in AM.  Objective: Vitals:   09/29/23 0458 09/29/23 1217 09/29/23 2023 09/30/23 0345  BP: (!) 111/54 (!) 114/59 125/61 116/63  Pulse: 74 66 77 79  Resp: 15 17 18 18   Temp: 97.8 F (36.6 C) 97.8 F (36.6 C) 98.4 F (36.9 C) 98.5 F (36.9 C)  TempSrc: Oral Oral Oral Oral  SpO2: 99% 100% 97% 97%  Weight:      Height:        Intake/Output Summary (Last 24 hours) at 09/30/2023 1221 Last data filed at 09/30/2023 0957 Gross per 24 hour  Intake 580 ml  Output 6450 ml  Net -5870 ml   Filed Weights   09/28/23 1144  Weight: 75 kg    Examination:  Physical Exam Vitals and nursing note reviewed.  Constitutional:      General: He is not in acute distress.    Appearance: He is normal weight. He is not toxic-appearing or diaphoretic.  HENT:     Head: Normocephalic and atraumatic.     Nose: Nose normal.  Eyes:     General: No scleral icterus. Cardiovascular:     Rate and Rhythm: Normal rate and regular rhythm.  Pulmonary:     Effort: Pulmonary effort is normal.     Breath sounds: Normal breath sounds.  Abdominal:     General: Bowel sounds are normal. There is no distension.     Palpations: Abdomen is soft.  Genitourinary:    Comments: CBI with pink urine in catheter tubing Musculoskeletal:     Right lower leg: No edema.     Left lower leg: No edema.  Skin:    General: Skin is warm  and dry.     Capillary Refill: Capillary refill takes less than 2 seconds.  Neurological:     Mental Status: He is alert.     Data Reviewed: I have personally reviewed following labs and imaging studies  CBC: Recent Labs  Lab 09/28/23 1055 09/28/23 1713 09/28/23 2133 09/29/23 0406  WBC 7.2  --   --  7.3  NEUTROABS 6.0  --   --   --   HGB 10.8* 11.0* 9.7* 9.4*  HCT 32.4* 33.2* 29.9* 28.8*  MCV 91.3  --   --  92.9  PLT 257  --   --  235   Basic Metabolic Panel: Recent Labs  Lab 09/28/23 1055 09/29/23 0406  NA 138 140  K 4.1 3.9  CL 107 109  CO2 21* 21*  GLUCOSE 104* 101*  BUN 55* 53*  CREATININE 2.97* 2.96*  CALCIUM 9.3 8.8*   GFR: Estimated Creatinine Clearance: 16.4 mL/min (A) (by C-G formula based on SCr of 2.96 mg/dL (H)). Liver Function Tests: Recent Labs  Lab 09/29/23 0406  AST 13*  ALT 14  ALKPHOS 57  BILITOT 0.7  PROT 6.5  ALBUMIN 3.4*   Radiology Studies: CT Renal Stone Study  Result Date: 09/28/2023  CLINICAL DATA:  Hematuria. 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 EXAM: CT ABDOMEN AND PELVIS WITHOUT CONTRAST TECHNIQUE: Multidetector CT imaging of the abdomen and pelvis was performed following the standard protocol without IV contrast. RADIATION DOSE REDUCTION: This exam was performed according to the departmental dose-optimization program which includes automated exposure control, adjustment of the mA and/or kV according to patient size and/or use of iterative reconstruction technique. COMPARISON:  CT abdomen/pelvis dated March 17, 2023. FINDINGS: Lower chest: No acute abnormality. Hepatobiliary: No suspicious focal liver lesion, within the limits of a noncontrast exam. Probable small calcified gallstone. No gallbladder wall thickening. No biliary dilatation. Pancreas: Unremarkable. No pancreatic ductal dilatation or surrounding inflammatory changes. Spleen: Normal in size without focal abnormality. Adrenals/Urinary Tract: The adrenal glands  are unremarkable. Status post left nephrectomy. No suspicious focal soft tissue or collection to suggest recurrence. Normal noncontrast appearance of the right kidney. No suspicious focal lesion, within the limits of an noncontrast exam. Punctate nonobstructive 2 mm calculus in the inferior pole of the right kidney. No hydronephrosis. There is mild haziness and bladder wall thickening at the superior bladder dome. Stomach/Bowel: Stomach is within normal limits. Appendix appears normal. No evidence of obstruction. Descending and sigmoid colonic diverticulosis without evidence of acute diverticulitis. Vascular/Lymphatic: Aortic atherosclerosis. Similar prominent left periaortic nodes, not definitively enlarged by size criteria. Reproductive: Prostatomegaly.  Evidence of prior TURP. Other: Small fat containing umbilical hernia. No abdominopelvic ascites. Musculoskeletal: No acute osseous abnormality. Severe degenerative changes of the right hip resulting in bone-on-bone contact, subchondral sclerosis and prominent subchondral cystic changes. Moderate osteoarthritis of the left hip. Multilevel degenerative changes of the spine. IMPRESSION: 1. Status post left nephrectomy. No evidence of recurrence identified within the limits of a noncontrast examination. 2. Punctate nonobstructive 2 mm stone in the inferior pole of the right kidney. No hydronephrosis. 3. Similar prominent left periaortic nodes, not definitively enlarged by size criteria, are nonspecific. 4. Descending and sigmoid colonic diverticulosis without evidence of acute diverticulitis. Electronically Signed   By: Hart Robinsons M.D.   On: 09/28/2023 15:45    Scheduled Meds:  amLODipine  10 mg Oral QHS   Chlorhexidine Gluconate Cloth  6 each Topical Daily   ferrous sulfate  325 mg Oral BID WC   finasteride  5 mg Oral QHS   levothyroxine  50 mcg Oral Q0600   pantoprazole  20 mg Oral QHS   rosuvastatin  5 mg Oral QHS   sodium bicarbonate  650 mg Oral  BID   tamsulosin  0.4 mg Oral QHS   Continuous Infusions:   LOS: 1 day   Time spent: 35 minutes  Carollee Herter, DO  [facility]  09/30/2023, 12:21 PM

## 2023-09-30 NOTE — Plan of Care (Signed)
  Problem: Education: Goal: Knowledge of General Education information will improve Description: Including pain rating scale, medication(s)/side effects and non-pharmacologic comfort measures Outcome: Progressing   Problem: Clinical Measurements: Goal: Ability to maintain clinical measurements within normal limits will improve Outcome: Progressing   Problem: Activity: Goal: Risk for activity intolerance will decrease Outcome: Progressing   Problem: Nutrition: Goal: Adequate nutrition will be maintained Outcome: Progressing   Problem: Pain Management: Goal: General experience of comfort will improve Outcome: Progressing

## 2023-09-30 NOTE — Plan of Care (Signed)
  Problem: Pain Management: Goal: General experience of comfort will improve Outcome: Progressing   Problem: Safety: Goal: Ability to remain free from injury will improve Outcome: Progressing   Problem: Skin Integrity: Goal: Risk for impaired skin integrity will decrease Outcome: Progressing

## 2023-09-30 NOTE — Progress Notes (Signed)
Subjective: Fitful sleep last night, but no acute events. Pleasant this morning. CBI low gtt with light pink urine. Tolerating well.   Objective: Vital signs in last 24 hours: Temp:  [97.8 F (36.6 C)-98.5 F (36.9 C)] 98.5 F (36.9 C) (11/07 0345) Pulse Rate:  [66-79] 79 (11/07 0345) Resp:  [17-18] 18 (11/07 0345) BP: (114-125)/(59-63) 116/63 (11/07 0345) SpO2:  [97 %-100 %] 97 % (11/07 0345)  Assessment/Plan: # Hematuria # S/p TURBT plus BCG x 6 # Left ureterectomy failing [name]   Started Keytruda a couple of weeks ago.  Hematuria catheter with initiation of CBI and hand irrigation in the ED on 09/28/2023. The amount of clot material passed and hand irrigated and total does not represent the amount of material at the bladder neck reflected on CT A/P.  This may represent a rapid recurrence of his bladder cancer.  Dr. Berneice Heinrich will take him to the OR on Friday for cystoscopy with TURBT, clot evacuation and fulguration if needed. Continue to trend hemoglobin. No labs available today. CBI on very low gtt on rounds. Improvement since yesterday. Small clots in tubing. No obstruction overnight. Will continue to follow.   Intake/Output from previous day: 11/06 0701 - 11/07 0700 In: 220 [P.O.:220] Out: 7250 [Urine:7250]  Intake/Output this shift: Total I/O In: 360 [P.O.:360] Out: 700 [Urine:700]  Physical Exam:  General: Alert and oriented CV: No cyanosis Lungs: equal chest rise Abdomen: Soft, NTND, no rebound or guarding Gu: 3- way hematuria coude in place. CBI on low gtt. Minor bleeding  Lab Results: Recent Labs    09/28/23 1713 09/28/23 2133 09/29/23 0406  HGB 11.0* 9.7* 9.4*  HCT 33.2* 29.9* 28.8*   BMET Recent Labs    09/28/23 1055 09/29/23 0406  NA 138 140  K 4.1 3.9  CL 107 109  CO2 21* 21*  GLUCOSE 104* 101*  BUN 55* 53*  CREATININE 2.97* 2.96*  CALCIUM 9.3 8.8*     Studies/Results: CT Renal Stone Study  Result Date: 09/28/2023 CLINICAL  DATA:  Hematuria. 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 EXAM: CT ABDOMEN AND PELVIS WITHOUT CONTRAST TECHNIQUE: Multidetector CT imaging of the abdomen and pelvis was performed following the standard protocol without IV contrast. RADIATION DOSE REDUCTION: This exam was performed according to the departmental dose-optimization program which includes automated exposure control, adjustment of the mA and/or kV according to patient size and/or use of iterative reconstruction technique. COMPARISON:  CT abdomen/pelvis dated March 17, 2023. FINDINGS: Lower chest: No acute abnormality. Hepatobiliary: No suspicious focal liver lesion, within the limits of a noncontrast exam. Probable small calcified gallstone. No gallbladder wall thickening. No biliary dilatation. Pancreas: Unremarkable. No pancreatic ductal dilatation or surrounding inflammatory changes. Spleen: Normal in size without focal abnormality. Adrenals/Urinary Tract: The adrenal glands are unremarkable. Status post left nephrectomy. No suspicious focal soft tissue or collection to suggest recurrence. Normal noncontrast appearance of the right kidney. No suspicious focal lesion, within the limits of an noncontrast exam. Punctate nonobstructive 2 mm calculus in the inferior pole of the right kidney. No hydronephrosis. There is mild haziness and bladder wall thickening at the superior bladder dome. Stomach/Bowel: Stomach is within normal limits. Appendix appears normal. No evidence of obstruction. Descending and sigmoid colonic diverticulosis without evidence of acute diverticulitis. Vascular/Lymphatic: Aortic atherosclerosis. Similar prominent left periaortic nodes, not definitively enlarged by size criteria. Reproductive: Prostatomegaly.  Evidence of prior TURP. Other: Small fat containing umbilical hernia. No abdominopelvic ascites. Musculoskeletal: No acute osseous abnormality. Severe degenerative  changes of the right hip resulting in bone-on-bone  contact, subchondral sclerosis and prominent subchondral cystic changes. Moderate osteoarthritis of the left hip. Multilevel degenerative changes of the spine. IMPRESSION: 1. Status post left nephrectomy. No evidence of recurrence identified within the limits of a noncontrast examination. 2. Punctate nonobstructive 2 mm stone in the inferior pole of the right kidney. No hydronephrosis. 3. Similar prominent left periaortic nodes, not definitively enlarged by size criteria, are nonspecific. 4. Descending and sigmoid colonic diverticulosis without evidence of acute diverticulitis. Electronically Signed   By: Hart Robinsons M.D.   On: 09/28/2023 15:45      LOS: 1 day   [name], NP [facility] Pager: [phone]  09/30/2023, 8:58 AM

## 2023-10-01 ENCOUNTER — Encounter (HOSPITAL_COMMUNITY)
Admission: EM | Disposition: A | Discharge status: Home / Self Care | Payer: Self-pay | Source: Home / Self Care | Attending: Internal Medicine

## 2023-10-01 ENCOUNTER — Inpatient Hospital Stay (HOSPITAL_COMMUNITY): Payer: Medicare Other

## 2023-10-01 ENCOUNTER — Inpatient Hospital Stay (HOSPITAL_COMMUNITY): Payer: Medicare Other | Admitting: Anesthesiology

## 2023-10-01 ENCOUNTER — Encounter (HOSPITAL_COMMUNITY): Payer: Self-pay | Admitting: Internal Medicine

## 2023-10-01 DIAGNOSIS — E039 Hypothyroidism, unspecified: Secondary | ICD-10-CM

## 2023-10-01 DIAGNOSIS — N184 Chronic kidney disease, stage 4 (severe): Secondary | ICD-10-CM | POA: Diagnosis not present

## 2023-10-01 DIAGNOSIS — C679 Malignant neoplasm of bladder, unspecified: Secondary | ICD-10-CM

## 2023-10-01 DIAGNOSIS — R310 Gross hematuria: Secondary | ICD-10-CM | POA: Diagnosis not present

## 2023-10-01 DIAGNOSIS — I10 Essential (primary) hypertension: Secondary | ICD-10-CM | POA: Diagnosis not present

## 2023-10-01 DIAGNOSIS — D62 Acute posthemorrhagic anemia: Secondary | ICD-10-CM | POA: Diagnosis not present

## 2023-10-01 HISTORY — PX: TRANSURETHRAL RESECTION OF BLADDER TUMOR: SHX2575

## 2023-10-01 LAB — CBC WITH DIFFERENTIAL/PLATELET
Abs Immature Granulocytes: 0.02 10*3/uL (ref 0.00–0.07)
Basophils Absolute: 0.1 10*3/uL (ref 0.0–0.1)
Basophils Relative: 1 %
Eosinophils Absolute: 0.3 10*3/uL (ref 0.0–0.5)
Eosinophils Relative: 4 %
HCT: 28.7 % — ABNORMAL LOW (ref 39.0–52.0)
Hemoglobin: 9.5 g/dL — ABNORMAL LOW (ref 13.0–17.0)
Immature Granulocytes: 0 %
Lymphocytes Relative: 8 %
Lymphs Abs: 0.6 10*3/uL — ABNORMAL LOW (ref 0.7–4.0)
MCH: 30.3 pg (ref 26.0–34.0)
MCHC: 33.1 g/dL (ref 30.0–36.0)
MCV: 91.4 fL (ref 80.0–100.0)
Monocytes Absolute: 0.7 10*3/uL (ref 0.1–1.0)
Monocytes Relative: 9 %
Neutro Abs: 6.4 10*3/uL (ref 1.7–7.7)
Neutrophils Relative %: 78 %
Platelets: 240 10*3/uL (ref 150–400)
RBC: 3.14 MIL/uL — ABNORMAL LOW (ref 4.22–5.81)
RDW: 14.2 % (ref 11.5–15.5)
WBC: 8.2 10*3/uL (ref 4.0–10.5)
nRBC: 0 % (ref 0.0–0.2)

## 2023-10-01 LAB — COMPREHENSIVE METABOLIC PANEL
ALT: 15 U/L (ref 0–44)
AST: 15 U/L (ref 15–41)
Albumin: 3.4 g/dL — ABNORMAL LOW (ref 3.5–5.0)
Alkaline Phosphatase: 62 U/L (ref 38–126)
Anion gap: 9 (ref 5–15)
BUN: 58 mg/dL — ABNORMAL HIGH (ref 8–23)
CO2: 22 mmol/L (ref 22–32)
Calcium: 8.9 mg/dL (ref 8.9–10.3)
Chloride: 106 mmol/L (ref 98–111)
Creatinine, Ser: 2.95 mg/dL — ABNORMAL HIGH (ref 0.61–1.24)
GFR, Estimated: 20 mL/min — ABNORMAL LOW (ref >60)
Glucose, Bld: 115 mg/dL — ABNORMAL HIGH (ref 70–99)
Potassium: 4.3 mmol/L (ref 3.5–5.1)
Sodium: 137 mmol/L (ref 135–145)
Total Bilirubin: 0.8 mg/dL (ref <1.2)
Total Protein: 6.8 g/dL (ref 6.5–8.1)

## 2023-10-01 SURGERY — TURBT (TRANSURETHRAL RESECTION OF BLADDER TUMOR)
Anesthesia: General

## 2023-10-01 MED ORDER — GENTAMICIN SULFATE 40 MG/ML IJ SOLN
120.0000 mg | INTRAVENOUS | Status: AC
  Administered 2023-10-01: 120 mg via INTRAVENOUS
  Filled 2023-10-01: qty 3

## 2023-10-01 MED ORDER — AMISULPRIDE (ANTIEMETIC) 5 MG/2ML IV SOLN: 10.0000 mg | Freq: Once | INTRAVENOUS | Status: DC | PRN

## 2023-10-01 MED ORDER — ORAL CARE MOUTH RINSE: 15.0000 mL | OROMUCOSAL | Status: DC | PRN

## 2023-10-01 MED ORDER — EPHEDRINE SULFATE-NACL 50-0.9 MG/10ML-% IV SOSY
PREFILLED_SYRINGE | INTRAVENOUS | Status: DC | PRN
  Administered 2023-10-01: 5 mg via INTRAVENOUS

## 2023-10-01 MED ORDER — DEXAMETHASONE SODIUM PHOSPHATE 10 MG/ML IJ SOLN
INTRAMUSCULAR | Status: AC
  Filled 2023-10-01: qty 1

## 2023-10-01 MED ORDER — ONDANSETRON HCL 4 MG/2ML IJ SOLN
INTRAMUSCULAR | Status: AC
  Filled 2023-10-01: qty 2

## 2023-10-01 MED ORDER — ACETAMINOPHEN 500 MG PO TABS
1000.0000 mg | ORAL_TABLET | Freq: Once | ORAL | Status: AC
  Administered 2023-10-01: 1000 mg via ORAL
  Filled 2023-10-01: qty 2

## 2023-10-01 MED ORDER — GENTAMICIN IN SALINE 1.2-0.9 MG/ML-% IV SOLN: 120.0000 mg | INTRAVENOUS | Status: DC

## 2023-10-01 MED ORDER — FENTANYL CITRATE (PF) 100 MCG/2ML IJ SOLN
INTRAMUSCULAR | Status: AC
  Filled 2023-10-01: qty 2

## 2023-10-01 MED ORDER — IOHEXOL 300 MG/ML  SOLN
INTRAMUSCULAR | Status: DC | PRN
  Administered 2023-10-01: 20 mL

## 2023-10-01 MED ORDER — OXYCODONE HCL 5 MG/5ML PO SOLN: 5.0000 mg | Freq: Once | ORAL | Status: DC | PRN

## 2023-10-01 MED ORDER — LACTATED RINGERS IV SOLN: INTRAVENOUS | Status: DC

## 2023-10-01 MED ORDER — PHENYLEPHRINE 80 MCG/ML (10ML) SYRINGE FOR IV PUSH (FOR BLOOD PRESSURE SUPPORT)
PREFILLED_SYRINGE | INTRAVENOUS | Status: DC | PRN
  Administered 2023-10-01 (×3): 80 ug via INTRAVENOUS
  Administered 2023-10-01: 160 ug via INTRAVENOUS
  Administered 2023-10-01 (×2): 80 ug via INTRAVENOUS

## 2023-10-01 MED ORDER — SODIUM CHLORIDE 0.9 % IR SOLN
Status: DC | PRN
  Administered 2023-10-01: 9000 mL via INTRAVESICAL

## 2023-10-01 MED ORDER — FENTANYL CITRATE PF 50 MCG/ML IJ SOSY
PREFILLED_SYRINGE | INTRAMUSCULAR | Status: AC
  Filled 2023-10-01: qty 1

## 2023-10-01 MED ORDER — FENTANYL CITRATE (PF) 100 MCG/2ML IJ SOLN
INTRAMUSCULAR | Status: DC | PRN
  Administered 2023-10-01 (×4): 25 ug via INTRAVENOUS

## 2023-10-01 MED ORDER — FENTANYL CITRATE PF 50 MCG/ML IJ SOSY
25.0000 ug | PREFILLED_SYRINGE | INTRAMUSCULAR | Status: DC | PRN
  Administered 2023-10-01 (×2): 25 ug via INTRAVENOUS

## 2023-10-01 MED ORDER — LIDOCAINE 2% (20 MG/ML) 5 ML SYRINGE
INTRAMUSCULAR | Status: DC | PRN
  Administered 2023-10-01: 80 mg via INTRAVENOUS

## 2023-10-01 MED ORDER — LIDOCAINE HCL (PF) 2 % IJ SOLN
INTRAMUSCULAR | Status: AC
  Filled 2023-10-01: qty 5

## 2023-10-01 MED ORDER — DEXAMETHASONE SODIUM PHOSPHATE 10 MG/ML IJ SOLN
INTRAMUSCULAR | Status: DC | PRN
  Administered 2023-10-01: 4 mg via INTRAVENOUS

## 2023-10-01 MED ORDER — OXYCODONE HCL 5 MG PO TABS: 5.0000 mg | ORAL_TABLET | Freq: Once | ORAL | Status: DC | PRN

## 2023-10-01 MED ORDER — PROPOFOL 10 MG/ML IV BOLUS
INTRAVENOUS | Status: DC | PRN
  Administered 2023-10-01: 40 mg via INTRAVENOUS
  Administered 2023-10-01: 30 mg via INTRAVENOUS
  Administered 2023-10-01: 110 mg via INTRAVENOUS

## 2023-10-01 SURGICAL SUPPLY — 18 items
BAG DRN RND TRDRP ANRFLXCHMBR (UROLOGICAL SUPPLIES)
BAG URINE DRAIN 2000ML AR STRL (UROLOGICAL SUPPLIES) IMPLANT
BAG URO CATCHER STRL LF (MISCELLANEOUS) ×2 IMPLANT
DRAPE FOOT SWITCH (DRAPES) ×2 IMPLANT
ELECT REM PT RETURN 15FT ADLT (MISCELLANEOUS) ×2 IMPLANT
EVACUATOR MICROVAS BLADDER (UROLOGICAL SUPPLIES) IMPLANT
GLOVE SURG LX STRL 7.5 STRW (GLOVE) ×2 IMPLANT
GOWN STRL REUS W/ TWL XL LVL3 (GOWN DISPOSABLE) ×2 IMPLANT
GOWN STRL REUS W/TWL XL LVL3 (GOWN DISPOSABLE) ×1
KIT TURNOVER KIT A (KITS) IMPLANT
LOOP CUT BIPOLAR 24F LRG (ELECTROSURGICAL) IMPLANT
MANIFOLD NEPTUNE II (INSTRUMENTS) ×2 IMPLANT
PACK CYSTO (CUSTOM PROCEDURE TRAY) ×2 IMPLANT
PAD PREP 24X48 CUFFED NSTRL (MISCELLANEOUS) ×2 IMPLANT
SYR TOOMEY IRRIG 70ML (MISCELLANEOUS) ×1
SYRINGE TOOMEY IRRIG 70ML (MISCELLANEOUS) IMPLANT
TUBING CONNECTING 10 (TUBING) ×2 IMPLANT
TUBING UROLOGY SET (TUBING) ×2 IMPLANT

## 2023-10-01 NOTE — Progress Notes (Signed)
PHARMACY NOTE -  Gentamicin  Pharmacy has been assisting with dosing of gentamicin for surgical prophylaxis. Based on low clearance, patient will receive gentamicin 1.5 mg/kg IV x 1, which will cover the full 24 hour prophylaxis window Will sign off; please reconsult for any future needs   Bernadene Person, PharmD, BCPS [phone] 10/01/2023, 7:49 AM

## 2023-10-01 NOTE — Progress Notes (Signed)
[facility] Pre-op Review  Pre-op checklist: To be completed by bedside RN  NPO: Ordered  Labs:   Consent: Ordered  H&P: Hospitalist  Vitals: WNL  O2 requirements: RA  MAR/PTA review: No BB/GLPs/Anticoags  IV: 20G  Floor nurse name:  Robina Ade, RN   Additional info:

## 2023-10-01 NOTE — Op Note (Unsigned)
[name] [name], FASOLINO MEDICAL RECORD NO: [id number] ACCOUNT NO: [id number] DATE OF BIRTH: 1932/04/02 FACILITY: [facility] LOCATION: [id number] PHYSICIAN: Sebastian Ache, MD  Operative Report   PREOPERATIVE DIAGNOSES:  Gross hematuria with urinary retention, high suspicion of recurrent bladder cancer.  POSTOPERATIVE DIAGNOSIS:  Small volume recurrence of bladder cancer.  PROCEDURE PERFORMED: 1.  Cystoscopy with clot evacuation. 2.  Transurethral resection of the bladder tumor, volume medium. 3.  Right retrograde pyelogram with interpretation.  ESTIMATED BLOOD LOSS:  Approximately 100 mL old firm clot with minimal active bleeding.  FINDINGS: 1.  Unremarkable right retrograde pyelogram. 2.  Multifocal recurrence of bladder cancer, dominant foci right bladder neck, total surface area of 4 cm2.  SPECIMENS:  Bladder tumor for permanent pathology.  DRAIN:  None.  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  He is status post left nephroureterectomy last year and did commendably well periop that had failed multiple rounds of  local therapy prior.  He did have a history of high-grade bladder cancer as well at that time and I counseled him at that time towards considering cystectomy; however, he declined.  Unfortunately, he has had recurrent bladder cancer following this that  has been high-grade BCG refractory.  He has most recently just been started on systemic immune therapy with Keytruda; however, he has tolerated even one dose of this quite poorly.  He then subsequently developed new hematuria with clot retention.  He has  been in the hospital for several days for bladder irrigation and this has failed to clear.  Overall, the picture is highly concerning for rapidly recurrent bladder cancer.  I counseled him towards operative intervention with transurethral resection with  diagnostic and therapeutic intent.   He presents for this today.  Informed consent was obtained and placed in medical record.  PROCEDURE IN DETAIL:  Patient being [name] being verified and procedure being cysto, right retrograde and possible transurethral resection of bladder tumor was confirmed.  Procedure timeout was performed.  Intravenous antibiotics were  administered.  General LMA anesthesia induced.  The patient was placed into a low lithotomy position.  Sterile field was created, prepped and draped the patient's penis, perineum, and proximal thighs using iodine.  Cystourethroscopy was performed using  26-French resectoscope sheath with visual obturator. Inspection of the urinary bladder revealed a large volume of firm clot within the bladder. This was irrigated with Toomey syringe.  Approximately 100 mL of old grape jelly consistency clot was  evacuated.  Repeat inspection revealed resolution of clot.  There was multifocal erythema of the urinary bladder and some papillary changes at the dome, posterior wall, and then right bladder neck consistent with recurrent bladder cancer.  Total surface  area of approximately 4 cm2.  The right ureteral orifice was then cannulated with a 6-French end-hole catheter, and a right retrograde pyelogram was obtained.  Right retrograde pyelogram demonstrated single right ureter, single system right kidney.  No filling defects or narrowing noted.  Using medium-sized resectoscope loop, all foci of bladder tumor were resected down to the superficial fibromuscular stroma  of the urinary bladder.  These tissue fragments were irrigated and set aside for permanent pathology, labeled as recurrent bladder cancer.  These areas were fulgurated using coagulation current as well as some other areas of erythema with subtle  papillary changes likely with additional foci of early bladder cancer recurrence.  This resulted in essentially complete hemostasis.  I felt it  was safest to leave this catheter out  as further mechanical irritation of the bladder would likely only  promote additional bleeding.  The bladder was partially empty per cystoscope.  Procedure was then terminated.  The patient tolerated the procedure well, no immediate periprocedural complications.  The patient taken to postanesthesia care unit in stable  condition with plan for continued hospital admission.  Likely discharge home soon within a day or two pending ability to void successfully.   NIK D: 10/01/2023 4:08:31 pm T: 10/01/2023 10:13:00 pm  JOB: [id number]/ [id number]

## 2023-10-01 NOTE — Progress Notes (Signed)
   Day of Surgery Subjective: Fitful sleep last night, but no acute events. Pleasant this morning. CBI low gtt with light pink urine. Tolerating well.   Objective: Vital signs in last 24 hours: Temp:  [98 F (36.7 C)-98.2 F (36.8 C)] 98.2 F (36.8 C) (11/08 0433) Pulse Rate:  [79-80] 80 (11/08 0433) Resp:  [18] 18 (11/08 0433) BP: (118-130)/(69-70) 118/70 (11/08 0433) SpO2:  [95 %-99 %] 95 % (11/08 0433)  Assessment/Plan: # Hematuria # S/p TURBT plus BCG x 6 # Left ureterectomy failing [name]   Started Keytruda a couple of weeks ago.  Hematuria catheter with initiation of CBI and hand irrigation in the ED on 09/28/2023. Concern for rapid recurrence of bladder cancer, particularly around bladder neck. Continue to trend hemoglobin.  Stable for last 3 checks.  9.5 today. Reports of occasional ongoing spasm and clot obstruction of urine overnight.  CBI was transition to full open and thankfully similar just before my arrival.  I slowed this further.  Light pink irrigant on low gtt. To the OR for cystoscopy with TURBT, clot evacuation and fulguration today-Dr. Berneice Heinrich   Intake/Output from previous day: 11/07 0701 - 11/08 0700 In: 16109 [P.O.:920] Out: 22650 [Urine:22650]  Intake/Output this shift: Total I/O In: -  Out: 4225 [Urine:4225]  Physical Exam:  General: Alert and oriented CV: No cyanosis Lungs: equal chest rise Abdomen: Soft, NTND, no rebound or guarding Gu: 3- way hematuria coude in place. CBI on low gtt. Minor bleeding  Lab Results: Recent Labs    09/28/23 2133 09/29/23 0406 10/01/23 0423  HGB 9.7* 9.4* 9.5*  HCT 29.9* 28.8* 28.7*   BMET Recent Labs    09/29/23 0406 10/01/23 0423  NA 140 137  K 3.9 4.3  CL 109 106  CO2 21* 22  GLUCOSE 101* 115*  BUN 53* 58*  CREATININE 2.96* 2.95*  CALCIUM 8.8* 8.9     Studies/Results: No results found.    LOS: 2 days   [name], NP [facility] Pager: [phone]  10/01/2023, 2:09 PM

## 2023-10-01 NOTE — Anesthesia Preprocedure Evaluation (Addendum)
Anesthesia Evaluation  Patient identified by MRN, date of birth, ID band Patient awake    Reviewed: Allergy & Precautions, NPO status , Patient's Chart, lab work & pertinent test results  History of Anesthesia Complications Negative for: history of anesthetic complications  Airway Mallampati: III  TM Distance: >3 FB Neck ROM: Full    Dental  (+) Dental Advisory Given Multiple crowns with one broken one on the top right. Denies anything being loose.:   Pulmonary neg shortness of breath, asthma , sleep apnea (BiPAP) , neg COPD, neg recent URI   Pulmonary exam normal breath sounds clear to auscultation       Cardiovascular hypertension (amlodipine), Pt. on medications (-) angina (-) Past MI, (-) Cardiac Stents and (-) CABG + dysrhythmias  Rhythm:Regular Rate:Normal  HLD  Low-risk stress test 08/22/2020  TTE 05/03/2020: IMPRESSIONS     1. Left ventricular ejection fraction, by estimation, is 55 to 60%. The  left ventricle has normal function. The left ventricle has no regional  wall motion abnormalities. There is mild concentric left ventricular  hypertrophy. Left ventricular diastolic  parameters were normal.   2. Right ventricular systolic function is normal. The right ventricular  size is normal.   3. Left atrial size was mildly dilated.   4. The mitral valve is normal in structure. Trivial mitral valve  regurgitation. No evidence of mitral stenosis.   5. The aortic valve is tricuspid. Aortic valve regurgitation is mild.  Mild aortic valve sclerosis is present, with no evidence of aortic valve  stenosis.   6. There is mild dilatation of the ascending aorta measuring 39 mm.   7. The inferior vena cava is normal in size with greater than 50%  respiratory variability, suggesting right atrial pressure of 3 mmHg.     Neuro/Psych  PSYCHIATRIC DISORDERS Anxiety     negative neurological ROS     GI/Hepatic Neg liver ROS,GERD   Medicated,,  Endo/Other  neg diabetesHypothyroidism  Pre-diabetes  Renal/GU CRFRenal disease   Recurrent bladder cancer    Musculoskeletal  (+) Arthritis , Osteoarthritis,    Abdominal   Peds  Hematology  (+) Blood dyscrasia, anemia Lab Results      Component                Value               Date                      WBC                      8.2                 10/01/2023                HGB                      9.5 (L)             10/01/2023                HCT                      28.7 (L)            10/01/2023                MCV  91.4                10/01/2023                PLT                      240                 10/01/2023              Anesthesia Other Findings   Reproductive/Obstetrics                             Anesthesia Physical Anesthesia Plan  ASA: 3  Anesthesia Plan: General   Post-op Pain Management: Tylenol PO (pre-op)*   Induction: Intravenous  PONV Risk Score and Plan: 2 and Ondansetron, Dexamethasone and Treatment may vary due to age or medical condition  Airway Management Planned: LMA  Additional Equipment:   Intra-op Plan:   Post-operative Plan: Extubation in OR  Informed Consent: I have reviewed the patients History and Physical, chart, labs and discussed the procedure including the risks, benefits and alternatives for the proposed anesthesia with the patient or authorized representative who has indicated his/her understanding and acceptance.     Dental advisory given  Plan Discussed with: CRNA and Anesthesiologist  Anesthesia Plan Comments: (Risks of general anesthesia discussed including, but not limited to, sore throat, hoarse voice, chipped/damaged teeth, injury to vocal cords, nausea and vomiting, allergic reactions, lung infection, heart attack, stroke, and death. All questions answered. )        Anesthesia Quick Evaluation

## 2023-10-01 NOTE — Plan of Care (Signed)
  Problem: Safety: Goal: Ability to remain free from injury will improve Outcome: Progressing   Problem: Skin Integrity: Goal: Risk for impaired skin integrity will decrease Outcome: Progressing   Problem: Pain Management: Goal: General experience of comfort will improve Outcome: Progressing   Problem: Elimination: Goal: Will not experience complications related to urinary retention Outcome: Progressing   Problem: Coping: Goal: Level of anxiety will decrease Outcome: Progressing   Problem: Education: Goal: Knowledge of General Education information will improve Description: Including pain rating scale, medication(s)/side effects and non-pharmacologic comfort measures Outcome: Progressing

## 2023-10-01 NOTE — Anesthesia Procedure Notes (Signed)
Procedure Name: LMA Insertion Date/Time: 10/01/2023 3:27 PM  Performed by: Sindy Guadeloupe, CRNAPre-anesthesia Checklist: Patient identified, Emergency Drugs available, Suction available, Patient being monitored and Timeout performed Patient Re-evaluated:Patient Re-evaluated prior to induction Oxygen Delivery Method: Circle system utilized Preoxygenation: Pre-oxygenation with 100% oxygen Induction Type: IV induction Ventilation: Mask ventilation without difficulty LMA: LMA inserted LMA Size: 4.0 Number of attempts: 1 Tube secured with: Tape Dental Injury: Teeth and Oropharynx as per pre-operative assessment

## 2023-10-01 NOTE — Brief Op Note (Signed)
10/01/2023  4:03 PM  PATIENT:  [name] [name]  89 y.o. male  PRE-OPERATIVE DIAGNOSIS:  PROBABLE RECURRENCE OF BLADDER CANCER  POST-OPERATIVE DIAGNOSIS:  BLADDER CANCER  PROCEDURE:  Procedure(s): TRANSURETHRAL RESECTION OF BLADDER TUMOR (TURBT)/CLOT EVACUATION WITH FULGERATION (N/A)  SURGEON:  Surgeons and Role:    * Laysha Childers, Delbert Phenix., MD - Primary  PHYSICIAN ASSISTANT:   ASSISTANTS: none   ANESTHESIA:   general  EBL:  old clot   BLOOD ADMINISTERED:none  DRAINS: none   LOCAL MEDICATIONS USED:  NONE  SPECIMEN:  Source of Specimen:  recurrent bladder cancer  DISPOSITION OF SPECIMEN:  PATHOLOGY  COUNTS:  YES  TOURNIQUET:  * No tourniquets in log *  DICTATION: .Other Dictation: Dictation Number [id number]  PLAN OF CARE: Admit to inpatient   PATIENT DISPOSITION:  PACU - hemodynamically stable.   Delay start of Pharmacological VTE agent (>24hrs) due to surgical blood loss or risk of bleeding: not applicable

## 2023-10-01 NOTE — Progress Notes (Signed)
Mobility Specialist - Progress Note   10/01/23 1057  Mobility  Activity Ambulated with assistance in hallway  Level of Assistance Minimal assist, patient does 75% or more  Assistive Device Front wheel walker  Distance Ambulated (ft) 300 ft  Range of Motion/Exercises Active  Activity Response Tolerated well  Mobility Referral Yes  $Mobility charge 1 Mobility  Mobility Specialist Start Time (ACUTE ONLY) 1030  Mobility Specialist Stop Time (ACUTE ONLY) 1057  Mobility Specialist Time Calculation (min) (ACUTE ONLY) 27 min   Pt was found in bed and agreeable to ambulate. Was min-A for bed mobility and STS. Required cues for posture once standing. C/o weakness during session and stated feeling "shaky". Upon returning to room had a bladder spasm. At EOS returned to use bathroom and understood to pull call bell when finished. RN notified.  Billey Chang Mobility Specialist

## 2023-10-01 NOTE — Anesthesia Postprocedure Evaluation (Signed)
Anesthesia Post Note  Patient: [name] [name]  Procedure(s) Performed: TRANSURETHRAL RESECTION OF BLADDER TUMOR (TURBT)/CLOT EVACUATION WITH FULGERATION     Patient location during evaluation: PACU Anesthesia Type: General Level of consciousness: awake Pain management: pain level controlled Vital Signs Assessment: post-procedure vital signs reviewed and stable Respiratory status: spontaneous breathing, nonlabored ventilation and respiratory function stable Cardiovascular status: blood pressure returned to baseline and stable Postop Assessment: no apparent nausea or vomiting Anesthetic complications: no   No notable events documented.  Last Vitals:  Vitals:   10/01/23 1645 10/01/23 1712  BP: (!) 107/90 (!) 111/58  Pulse: 79 79  Resp: 14 19  Temp: 36.7 C (!) 36.4 C  SpO2: 99% 99%    Last Pain:  Vitals:   10/01/23 1712  TempSrc:   PainSc: 4                  Linton Rump

## 2023-10-01 NOTE — Transfer of Care (Signed)
Immediate Anesthesia Transfer of Care Note  Patient: Kamareon Oakley Fryberger  Procedure(s) Performed: TRANSURETHRAL RESECTION OF BLADDER TUMOR (TURBT)/CLOT EVACUATION WITH FULGERATION  Patient Location: PACU  Anesthesia Type:General  Level of Consciousness: sedated  Airway & Oxygen Therapy: Patient Spontanous Breathing and Patient connected to face mask oxygen  Post-op Assessment: Report given to RN and Post -op Vital signs reviewed and stable  Post vital signs: Reviewed and stable  Last Vitals:  Vitals Value Taken Time  BP 103/55 10/01/23 1615  Temp    Pulse 86 10/01/23 1616  Resp 18 10/01/23 1616  SpO2 100 % 10/01/23 1616  Vitals shown include unfiled device data.  Last Pain:  Vitals:   10/01/23 1450  TempSrc: Oral  PainSc: 0-No pain      Patients Stated Pain Goal: 3 (09/29/23 1417)  Complications: No notable events documented.

## 2023-10-01 NOTE — Progress Notes (Signed)
Patient called out stating that he was having bladder spasms. This RN irrigated his foley and manually pulled out a large clot. CBI flow increased.

## 2023-10-01 NOTE — Progress Notes (Signed)
PROGRESS NOTE    [name] [name]  NWG:[id number] DOB: 1932-02-10 DOA: 09/28/2023 PCP: [name], MD  Subjective: Pt seen and examined.  Scheduled for cysto today CBI still running. Pink colored urine in foley bag.   Hospital Course: HPI:  [name] [name] is a 89 y.o. male with medical history significant of anemia, osteoarthritis, asthma, bladder cancer, multiple episodes of hematuria, status post TURP, BPH, stage IV CKD, dyspnea, fatigue, GERD, hyperlipidemia, impaired hearing, hypertension, hypothyroidism, pigmented basal cell carcinoma, prediabetes, history of UTI who presented to the emergency department with complaints of gross hematuria since early morning and urinary retention since around 0700.  No flank pain.  He denied fever, chills, rhinorrhea, sore throat, wheezing or hemoptysis.  No chest pain, palpitations, diaphoresis, PND, orthopnea or pitting edema of the lower extremities.  No abdominal pain, nausea, emesis, diarrhea, constipation, melena or hematochezia.  No polyuria, polydipsia, polyphagia or blurred vision.    Lab work: Urinalysis was extremely red and turbid with unreliable results due to pigment interference.  CBC showed a white count of 7.2, hemoglobin 10.8 g/dL platelets 578.  BMP showed CO2 of 21 mmol/L with a normal anion gap, the rest of the electrolytes are normal.  Glucose 104, BUN 55 creatinine 2.97 mg/dL.  His creatinine level is around baseline.   Imaging: CT renal study shows status post left nephrectomy.  No evidence of recurrence identified within the limits of a noncontrast examination.  Punctate nonobstructing 2 mm stone in the inferior pole of the right kidney.  No hydronephrosis.  Similar prominent left periaortic nodes, nonspecific.  Colon diverticulosis without diverticulitis.   ED course: Initial vital signs were temperature 98 F, pulse 87, respiration 18, BP 159/85 mmHg O2 sat 100% on room air.  Urology placed a three-way hematuria  catheter for CBI irrigation.  Significant Events: Admitted 09/28/2023 for gross hematuria 09-28-2023 hematuria catheter placed by urology  Significant Labs: Admission HgB 10.8, Scr 2.97 09-29-2023 HgB 9.4  Significant Imaging Studies: Admission CT renal stone Status post left nephrectomy. No evidence of recurrence identified within the limits of a noncontrast examination. 2. Punctate nonobstructive 2 mm stone in the inferior pole of the right kidney. No hydronephrosis. 3. Similar prominent left periaortic nodes, not definitively enlarged by size criteria, are nonspecific. 4. Descending and sigmoid colonic diverticulosis without evidence of acute diverticulitis.  Antibiotic Therapy: Anti-infectives (From admission, onward)    None       Procedures: 09-28-2023 insertion hematuria catheter  Consultants: 09-28-2023 urology    Assessment and Plan: * Gross hematuria 09-29-2023 remains on CBI per urology. 09-30-2023 urology notes reviewed. Plans for cysto tomorrow. NPO after MN. Type and screen should be good until midnight tomorrow night if he needs blood. 10-01-2023 for cysto today. Possible resection of more tumor.  Acute blood loss anemia (ABLA) 09-29-2023 follow Hgb. Pt does not need any IV meds at this point. Pt aware he may need PRBC transfusion. He denies being jehovah's witness. He agrees to accept PRBC transfusion if needed.  09-30-2023 will repeat CBC in AM. Type and screen good until 2359 on 10-01-2023. 10-01-2023 HgB stable. Does not require PRBC transfusion at this time  Hypothyroidism 09-29-2023 continue synthroid. 09-30-2023 stable  Prediabetes 09-29-2023 stable. 09-30-2023 stable  Mixed hyperlipidemia 09-29-2023 stable. 09-30-2023 stable  GERD (gastroesophageal reflux disease) 09-29-2023 remains on ppi and bicarb 09-30-2023 stable  Essential hypertension 09-29-2023 stable. 09-30-2023 stable 10-01-2023 stable. Continue norvasc 10 mg at bedtime.  Sleep apnea treated with  nocturnal BiPAP 09-29-2023 pt states  he no longer wear CPAP at night.  Malignant neoplasm of urinary bladder [facility]) Management per urology 09-30-2023 plan for cysto in OR tomorrow. 10-01-2023 to OR today  CKD (chronic kidney disease) stage 4, GFR 15-29 ml/min (HCC) - Baseline Scr 3.0 09-29-2023 stable. Baseline Scr 3.0 09-30-2023 repeat CMP in AM. 10-01-2023 Scr stable at 2.95   DVT prophylaxis: SCDs Start: 09/28/23 1356    Code Status: Full Code Family Communication: no family at bedside Disposition Plan: return home Reason for continuing need for hospitalization: to OR today.  Objective: Vitals:   09/30/23 0345 09/30/23 1245 09/30/23 2113 10/01/23 0433  BP: 116/63 115/67 130/69 118/70  Pulse: 79 79 79 80  Resp: 18 19 18 18   Temp: 98.5 F (36.9 C) 97.9 F (36.6 C) 98 F (36.7 C) 98.2 F (36.8 C)  TempSrc: Oral Oral Oral Oral  SpO2: 97% 100% 99% 95%  Weight:      Height:        Intake/Output Summary (Last 24 hours) at 10/01/2023 1154 Last data filed at 10/01/2023 0813 Gross per 24 hour  Intake 11914 ml  Output 24600 ml  Net -9360 ml   Filed Weights   09/28/23 1144  Weight: 75 kg    Examination:  Physical Exam Vitals and nursing note reviewed.  Constitutional:      General: He is not in acute distress.    Appearance: He is normal weight. He is not toxic-appearing or diaphoretic.  HENT:     Head: Normocephalic and atraumatic.     Nose: Nose normal.  Eyes:     General: No scleral icterus. Cardiovascular:     Rate and Rhythm: Normal rate and regular rhythm.  Pulmonary:     Effort: No respiratory distress.  Abdominal:     General: There is no distension.  Genitourinary:    Comments: CBI with pink urine in catheter tubing Musculoskeletal:     Right lower leg: No edema.     Left lower leg: No edema.  Neurological:     General: No focal deficit present.     Mental Status: He is alert and oriented to person, place, and time.     Data Reviewed: I have  personally reviewed following labs and imaging studies  CBC: Recent Labs  Lab 09/28/23 1055 09/28/23 1713 09/28/23 2133 09/29/23 0406 10/01/23 0423  WBC 7.2  --   --  7.3 8.2  NEUTROABS 6.0  --   --   --  6.4  HGB 10.8* 11.0* 9.7* 9.4* 9.5*  HCT 32.4* 33.2* 29.9* 28.8* 28.7*  MCV 91.3  --   --  92.9 91.4  PLT 257  --   --  235 240   Basic Metabolic Panel: Recent Labs  Lab 09/28/23 1055 09/29/23 0406 10/01/23 0423  NA 138 140 137  K 4.1 3.9 4.3  CL 107 109 106  CO2 21* 21* 22  GLUCOSE 104* 101* 115*  BUN 55* 53* 58*  CREATININE 2.97* 2.96* 2.95*  CALCIUM 9.3 8.8* 8.9   GFR: Estimated Creatinine Clearance: 16.4 mL/min (A) (by C-G formula based on SCr of 2.95 mg/dL (H)). Liver Function Tests: Recent Labs  Lab 09/29/23 0406 10/01/23 0423  AST 13* 15  ALT 14 15  ALKPHOS 57 62  BILITOT 0.7 0.8  PROT 6.5 6.8  ALBUMIN 3.4* 3.4*    Radiology Studies: No results found.  Scheduled Meds:  acetaminophen  1,000 mg Oral Once   amLODipine  10 mg Oral QHS   Chlorhexidine  Gluconate Cloth  6 each Topical Daily   ferrous sulfate  325 mg Oral BID WC   finasteride  5 mg Oral QHS   levothyroxine  50 mcg Oral Q0600   pantoprazole  20 mg Oral QHS   rosuvastatin  5 mg Oral QHS   sodium bicarbonate  650 mg Oral BID   tamsulosin  0.4 mg Oral QHS   Continuous Infusions:  gentamicin (GARAMYCIN) 120 mg in dextrose 5 % 50 mL IVPB       LOS: 2 days   Time spent: 35 minutes  Carollee Herter, DO  [facility]  10/01/2023, 11:54 AM

## 2023-10-01 NOTE — Plan of Care (Signed)
  Patient returned from PACU, VSS, awaiting improved voiding. Pain reduced per patient, still feeling "woozy". On room air. Alert and oriented.  Problem: Education: Goal: Knowledge of General Education information will improve Description: Including pain rating scale, medication(s)/side effects and non-pharmacologic comfort measures Outcome: Progressing   Problem: Health Behavior/Discharge Planning: Goal: Ability to manage health-related needs will improve Outcome: Progressing   Problem: Clinical Measurements: Goal: Ability to maintain clinical measurements within normal limits will improve Outcome: Progressing Goal: Will remain free from infection Outcome: Progressing Goal: Diagnostic test results will improve Outcome: Progressing Goal: Respiratory complications will improve Outcome: Progressing Goal: Cardiovascular complication will be avoided Outcome: Progressing   Problem: Activity: Goal: Risk for activity intolerance will decrease Outcome: Progressing   Problem: Nutrition: Goal: Adequate nutrition will be maintained Outcome: Progressing   Problem: Coping: Goal: Level of anxiety will decrease Outcome: Progressing   Problem: Elimination: Goal: Will not experience complications related to bowel motility Outcome: Progressing   Problem: Pain Management: Goal: General experience of comfort will improve Outcome: Progressing   Problem: Safety: Goal: Ability to remain free from injury will improve Outcome: Progressing   Problem: Skin Integrity: Goal: Risk for impaired skin integrity will decrease Outcome: Progressing

## 2023-10-01 NOTE — Progress Notes (Signed)
   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 very early bladder dome and neck recurene, CT normal, Cr 2.6 (stable)  06/2023 - cysto multifocal (dome, bladder neck, prostate fossa) recurrence ==> TaG3 with negative muscle.   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 Follows Dr. Creta Levin nephrology.   3 - Lower Urinary Tract Symptoms - on finasteride + Myrbetriq at baseline, prior channel TURP   4 - Gross Hematuria With Clots - new gross hematuria 09/2023 after recent Keytruda start (just 1 infusion) for recurent bladder cancer. Initiall had catheter irrigation but has not cleared. CT form ER this admission with some concernt of recurrent tumor.   Today "[name] [name]" is seen to proceed with cysto, right retrograde, and clot evac possible TURBT to r/o treat recurrent bladder cancer as cause of ongoing hematuira. Hgb fortunately stable.    Objective: Vital signs in last 24 hours: Temp:  [97.9 F (36.6 C)-98.2 F (36.8 C)] 98.2 F (36.8 C) (11/08 0433) Pulse Rate:  [79-80] 80 (11/08 0433) Resp:  [18-19] 18 (11/08 0433) BP: (115-130)/(67-70) 118/70 (11/08 0433) SpO2:  [95 %-100 %] 95 % (11/08 0433) Last BM Date : 09/30/23  Intake/Output from previous day: 11/07 0701 - 11/08 0700 In: 21308 [P.O.:920] Out: 20750 [Urine:20750] Intake/Output this shift: No intake/output data recorded.  NAD, wearing glasses Non-labored breathing on RA SNTND, prior scars w/o hernias 3 way foley in place with light pink urien on slow irrigation SCD's in place  Lab Results:  Recent Labs    09/29/23 0406 10/01/23 0423  WBC 7.3 8.2  HGB  9.4* 9.5*  HCT 28.8* 28.7*  PLT 235 240   BMET Recent Labs    09/29/23 0406 10/01/23 0423  NA 140 137  K 3.9 4.3  CL 109 106  CO2 21* 22  GLUCOSE 101* 115*  BUN 53* 58*  CREATININE 2.96* 2.95*  CALCIUM 8.8* 8.9   PT/INR No results for input(s): "LABPROT", "INR" in the last 72 hours. ABG No results for input(s): "PHART", "HCO3" in the last 72 hours.  Invalid input(s): "PCO2", "PO2"  Studies/Results: No results found.  Anti-infectives: Anti-infectives (From admission, onward)    None       Assessment/Plan:  Proceed as planned with cysto, Rt retrograde, clot eval, fulgeration, possible TURBT. Risks ,benefits, alternatives, expected peri-op course discussed previously and reiteratd tdoay .   Loletta Parish. 10/01/2023

## 2023-10-02 ENCOUNTER — Encounter (HOSPITAL_COMMUNITY): Payer: Self-pay | Admitting: Urology

## 2023-10-02 DIAGNOSIS — R310 Gross hematuria: Secondary | ICD-10-CM | POA: Diagnosis not present

## 2023-10-02 DIAGNOSIS — D62 Acute posthemorrhagic anemia: Secondary | ICD-10-CM | POA: Diagnosis not present

## 2023-10-02 DIAGNOSIS — Z905 Acquired absence of kidney: Secondary | ICD-10-CM

## 2023-10-02 DIAGNOSIS — I10 Essential (primary) hypertension: Secondary | ICD-10-CM | POA: Diagnosis not present

## 2023-10-02 DIAGNOSIS — N184 Chronic kidney disease, stage 4 (severe): Secondary | ICD-10-CM | POA: Diagnosis not present

## 2023-10-02 HISTORY — DX: Acquired absence of kidney: Z90.5

## 2023-10-02 LAB — COMPREHENSIVE METABOLIC PANEL
ALT: 13 U/L (ref 0–44)
AST: 14 U/L — ABNORMAL LOW (ref 15–41)
Albumin: 3.5 g/dL (ref 3.5–5.0)
Alkaline Phosphatase: 59 U/L (ref 38–126)
Anion gap: 10 (ref 5–15)
BUN: 63 mg/dL — ABNORMAL HIGH (ref 8–23)
CO2: 20 mmol/L — ABNORMAL LOW (ref 22–32)
Calcium: 8.8 mg/dL — ABNORMAL LOW (ref 8.9–10.3)
Chloride: 104 mmol/L (ref 98–111)
Creatinine, Ser: 3.22 mg/dL — ABNORMAL HIGH (ref 0.61–1.24)
GFR, Estimated: 18 mL/min — ABNORMAL LOW (ref >60)
Glucose, Bld: 192 mg/dL — ABNORMAL HIGH (ref 70–99)
Potassium: 4.5 mmol/L (ref 3.5–5.1)
Sodium: 134 mmol/L — ABNORMAL LOW (ref 135–145)
Total Bilirubin: 0.5 mg/dL (ref <1.2)
Total Protein: 6.7 g/dL (ref 6.5–8.1)

## 2023-10-02 LAB — CBC WITH DIFFERENTIAL/PLATELET
Abs Immature Granulocytes: 0.02 10*3/uL (ref 0.00–0.07)
Basophils Absolute: 0 10*3/uL (ref 0.0–0.1)
Basophils Relative: 0 %
Eosinophils Absolute: 0 10*3/uL (ref 0.0–0.5)
Eosinophils Relative: 0 %
HCT: 29.5 % — ABNORMAL LOW (ref 39.0–52.0)
Hemoglobin: 9.7 g/dL — ABNORMAL LOW (ref 13.0–17.0)
Immature Granulocytes: 0 %
Lymphocytes Relative: 4 %
Lymphs Abs: 0.3 10*3/uL — ABNORMAL LOW (ref 0.7–4.0)
MCH: 29.7 pg (ref 26.0–34.0)
MCHC: 32.9 g/dL (ref 30.0–36.0)
MCV: 90.2 fL (ref 80.0–100.0)
Monocytes Absolute: 0.3 10*3/uL (ref 0.1–1.0)
Monocytes Relative: 4 %
Neutro Abs: 6.6 10*3/uL (ref 1.7–7.7)
Neutrophils Relative %: 92 %
Platelets: 268 10*3/uL (ref 150–400)
RBC: 3.27 MIL/uL — ABNORMAL LOW (ref 4.22–5.81)
RDW: 13.9 % (ref 11.5–15.5)
WBC: 7.2 10*3/uL (ref 4.0–10.5)
nRBC: 0 % (ref 0.0–0.2)

## 2023-10-02 LAB — TYPE AND SCREEN
ABO/RH(D): A POS
Antibody Screen: NEGATIVE

## 2023-10-02 NOTE — Assessment & Plan Note (Signed)
stable °

## 2023-10-02 NOTE — Progress Notes (Addendum)
PROGRESS NOTE    [name] [name]  WJX:[id number] DOB: [birth year] DOA: 09/28/2023 PCP: [name], MD  Subjective: Pt seen and examined.   Pt had successful cysto yesterday. More tumor removed. Spoke post-op with Dr. Berneice Heinrich. Pt to f/u with him in office. Pt already had f/u scheduled with heme/onc next week. Pathology should be available by next week HgB stable. Pt has been able to urinate without difficulty. Ready for DC to home.   Hospital Course: HPI:  [name] [name] is a 89 y.o. male with medical history significant of anemia, osteoarthritis, asthma, bladder cancer, multiple episodes of hematuria, status post TURP, BPH, stage IV CKD, dyspnea, fatigue, GERD, hyperlipidemia, impaired hearing, hypertension, hypothyroidism, pigmented basal cell carcinoma, prediabetes, history of UTI who presented to the emergency department with complaints of gross hematuria since early morning and urinary retention since around 0700.  No flank pain.  He denied fever, chills, rhinorrhea, sore throat, wheezing or hemoptysis.  No chest pain, palpitations, diaphoresis, PND, orthopnea or pitting edema of the lower extremities.  No abdominal pain, nausea, emesis, diarrhea, constipation, melena or hematochezia.  No polyuria, polydipsia, polyphagia or blurred vision.    Lab work: Urinalysis was extremely red and turbid with unreliable results due to pigment interference.  CBC showed a white count of 7.2, hemoglobin 10.8 g/dL platelets 213.  BMP showed CO2 of 21 mmol/L with a normal anion gap, the rest of the electrolytes are normal.  Glucose 104, BUN 55 creatinine 2.97 mg/dL.  His creatinine level is around baseline.   Imaging: CT renal study shows status post left nephrectomy.  No evidence of recurrence identified within the limits of a noncontrast examination.  Punctate nonobstructing 2 mm stone in the inferior pole of the right kidney.  No hydronephrosis.  Similar prominent left periaortic nodes,  nonspecific.  Colon diverticulosis without diverticulitis.   ED course: Initial vital signs were temperature 98 F, pulse 87, respiration 18, BP 159/85 mmHg O2 sat 100% on room air.  Urology placed a three-way hematuria catheter for CBI irrigation.  Significant Events: Admitted 09/28/2023 for gross hematuria 09-28-2023 hematuria catheter placed by urology  Significant Labs: Admission HgB 10.8, Scr 2.97 09-29-2023 HgB 9.4  Significant Imaging Studies: Admission CT renal stone Status post left nephrectomy. No evidence of recurrence identified within the limits of a noncontrast examination. 2. Punctate nonobstructive 2 mm stone in the inferior pole of the right kidney. No hydronephrosis. 3. Similar prominent left periaortic nodes, not definitively enlarged by size criteria, are nonspecific. 4. Descending and sigmoid colonic diverticulosis without evidence of acute diverticulitis.  Antibiotic Therapy: Anti-infectives (From admission, onward)    None       Procedures: 09-28-2023 insertion hematuria catheter  Consultants: 09-28-2023 urology    Assessment and Plan: * Gross hematuria 09-29-2023 remains on CBI per urology. 09-30-2023 urology notes reviewed. Plans for cysto tomorrow. NPO after MN. Type and screen should be good until midnight tomorrow night if he needs blood. 10-01-2023 for cysto today. Possible resection of more tumor. 10-02-2023 s/p cysto yesterday with more tumor removal and fulguration. Ready for DC today.  Acute blood loss anemia (ABLA) 09-29-2023 follow Hgb. Pt does not need any IV meds at this point. Pt aware he may need PRBC transfusion. He denies being jehovah's witness. He agrees to accept PRBC transfusion if needed.  09-30-2023 will repeat CBC in AM. Type and screen good until 2359 on 10-01-2023. 10-01-2023 HgB stable. Does not require PRBC transfusion at this time 10-02-2023 DC HgB 9.7 g/dl  Hypothyroidism 09-29-2023 continue synthroid. 09-30-2023  stable  Prediabetes 09-29-2023 stable. 09-30-2023 stable  Mixed hyperlipidemia 09-29-2023 stable. 09-30-2023 stable  GERD (gastroesophageal reflux disease) 09-29-2023 remains on ppi and bicarb 09-30-2023 stable  Essential hypertension 09-29-2023 stable. 09-30-2023 stable 10-01-2023 stable. Continue norvasc 10 mg at bedtime.  Sleep apnea treated with nocturnal BiPAP 09-29-2023 pt states he no longer wear CPAP at night.  Malignant neoplasm of urinary bladder [facility]) Management per urology 09-30-2023 plan for cysto in OR tomorrow. 10-01-2023 to OR today 10-02-2023 more biopsy taken yesterday during surgery. Path pending.  CKD (chronic kidney disease) stage 4, GFR 15-29 ml/min (HCC) - Baseline Scr 3.0 09-29-2023 stable. Baseline Scr 3.0 09-30-2023 repeat CMP in AM. 10-01-2023 Scr stable at 2.95 10-02-2023 DC Scr 3.22       DVT prophylaxis: SCDs Start: 09/28/23 1356     Code Status: Full Code Family Communication: no family at bedside. Pt is decisional. Disposition Plan: return home Reason for continuing need for hospitalization: stable for DC today  Objective: Vitals:   10/01/23 2131 10/02/23 0104 10/02/23 0513 10/02/23 0856  BP: 117/67 104/62 (!) 102/58 108/69  Pulse: 83 81 80 83  Resp: 18 16 20 14   Temp: (!) 97.5 F (36.4 C) 97.7 F (36.5 C) 98.3 F (36.8 C) 97.7 F (36.5 C)  TempSrc: Oral Oral Oral Oral  SpO2: 98% 99% 97% 98%  Weight:      Height:        Intake/Output Summary (Last 24 hours) at 10/02/2023 1207 Last data filed at 10/02/2023 0650 Gross per 24 hour  Intake 1133 ml  Output 1280 ml  Net -147 ml   Filed Weights   09/28/23 1144  Weight: 75 kg    Examination:  Physical Exam Vitals and nursing note reviewed.  Constitutional:      General: He is not in acute distress.    Appearance: He is normal weight. He is not toxic-appearing or diaphoretic.  HENT:     Head: Normocephalic and atraumatic.     Nose: Nose normal.  Eyes:     General: No scleral  icterus. Pulmonary:     Effort: No respiratory distress.  Abdominal:     General: There is no distension.  Genitourinary:    Comments: Foley has been removed Musculoskeletal:     Right lower leg: No edema.     Left lower leg: No edema.  Neurological:     General: No focal deficit present.     Mental Status: He is alert and oriented to person, place, and time.     Data Reviewed: I have personally reviewed following labs and imaging studies  CBC: Recent Labs  Lab 09/28/23 1055 09/28/23 1713 09/28/23 2133 09/29/23 0406 10/01/23 0423 10/02/23 0457  WBC 7.2  --   --  7.3 8.2 7.2  NEUTROABS 6.0  --   --   --  6.4 6.6  HGB 10.8* 11.0* 9.7* 9.4* 9.5* 9.7*  HCT 32.4* 33.2* 29.9* 28.8* 28.7* 29.5*  MCV 91.3  --   --  92.9 91.4 90.2  PLT 257  --   --  235 240 268   Basic Metabolic Panel: Recent Labs  Lab 09/28/23 1055 09/29/23 0406 10/01/23 0423 10/02/23 0457  NA 138 140 137 134*  K 4.1 3.9 4.3 4.5  CL 107 109 106 104  CO2 21* 21* 22 20*  GLUCOSE 104* 101* 115* 192*  BUN 55* 53* 58* 63*  CREATININE 2.97* 2.96* 2.95* 3.22*  CALCIUM 9.3 8.8* 8.9 8.8*  GFR: Estimated Creatinine Clearance: 15 mL/min (A) (by C-G formula based on SCr of 3.22 mg/dL (H)). Liver Function Tests: Recent Labs  Lab 09/29/23 0406 10/01/23 0423 10/02/23 0457  AST 13* 15 14*  ALT 14 15 13   ALKPHOS 57 62 59  BILITOT 0.7 0.8 0.5  PROT 6.5 6.8 6.7  ALBUMIN 3.4* 3.4* 3.5    Radiology Studies: DG C-Arm 1-60 Min-No Report  Result Date: 10/01/2023 Fluoroscopy was utilized by the requesting physician.  No radiographic interpretation.    Scheduled Meds:  amLODipine  10 mg Oral QHS   Chlorhexidine Gluconate Cloth  6 each Topical Daily   ferrous sulfate  325 mg Oral BID WC   finasteride  5 mg Oral QHS   levothyroxine  50 mcg Oral Q0600   pantoprazole  20 mg Oral QHS   rosuvastatin  5 mg Oral QHS   sodium bicarbonate  650 mg Oral BID   tamsulosin  0.4 mg Oral QHS   Continuous  Infusions:   LOS: 3 days   Time spent: 40 minutes  Carollee Herter, DO  [facility]  10/02/2023, 12:07 PM

## 2023-10-02 NOTE — Discharge Summary (Addendum)
[facility] Physician Discharge Summary   Patient name: [name] [name]  Admit date:     09/28/2023  Discharge date: 10/02/2023  Attending Physician: Imogene Burn, Brin Ruggerio [[id number]]  Discharge Physician: Carollee Herter   PCP: [name], MD  Admitted From: Home Disposition:  Home  Recommendations for Outpatient Follow-up:  Follow up with PCP in 1-2 weeks Follow up with urology Dr. Berneice Heinrich as scheduled Follow up with Heme/onc Dr. [name] 10-08-2023 as scheduled Please follow up on the following pending results: pathology from cystoscopy 10-01-2023.  Home Health:No Equipment/Devices: None  Discharge Condition:Stable CODE STATUS:FULL Diet recommendation: Heart Healthy Fluid Restriction: None  Hospital Summary: HPI:  [name] [name] is a 89 y.o. male with medical history significant of anemia, osteoarthritis, asthma, bladder cancer, multiple episodes of hematuria, status post TURP, BPH, stage IV CKD, dyspnea, fatigue, GERD, hyperlipidemia, impaired hearing, hypertension, hypothyroidism, pigmented basal cell carcinoma, prediabetes, history of UTI who presented to the emergency department with complaints of gross hematuria since early morning and urinary retention since around 0700.  No flank pain.  He denied fever, chills, rhinorrhea, sore throat, wheezing or hemoptysis.  No chest pain, palpitations, diaphoresis, PND, orthopnea or pitting edema of the lower extremities.  No abdominal pain, nausea, emesis, diarrhea, constipation, melena or hematochezia.  No polyuria, polydipsia, polyphagia or blurred vision.    Lab work: Urinalysis was extremely red and turbid with unreliable results due to pigment interference.  CBC showed a white count of 7.2, hemoglobin 10.8 g/dL platelets 811.  BMP showed CO2 of 21 mmol/L with a normal anion gap, the rest of the electrolytes are normal.  Glucose 104, BUN 55 creatinine 2.97 mg/dL.  His creatinine level is around baseline.   Imaging: CT renal study shows  status post left nephrectomy.  No evidence of recurrence identified within the limits of a noncontrast examination.  Punctate nonobstructing 2 mm stone in the inferior pole of the right kidney.  No hydronephrosis.  Similar prominent left periaortic nodes, nonspecific.  Colon diverticulosis without diverticulitis.   ED course: Initial vital signs were temperature 98 F, pulse 87, respiration 18, BP 159/85 mmHg O2 sat 100% on room air.  Urology placed a three-way hematuria catheter for CBI irrigation.  Significant Events: Admitted 09/28/2023 for gross hematuria 09-28-2023 hematuria catheter placed by urology  Significant Labs: Admission HgB 10.8, Scr 2.97 09-29-2023 HgB 9.4 Discharge HgB 9.7 g/ld, Scr 3.22  Significant Imaging Studies: Admission CT renal stone Status post left nephrectomy. No evidence of recurrence identified within the limits of a noncontrast examination. 2. Punctate nonobstructive 2 mm stone in the inferior pole of the right kidney. No hydronephrosis. 3. Similar prominent left periaortic nodes, not definitively enlarged by size criteria, are nonspecific. 4. Descending and sigmoid colonic diverticulosis without evidence of acute diverticulitis.  Antibiotic Therapy: Anti-infectives (From admission, onward)    None       Procedures: 09-28-2023 insertion hematuria catheter 10-01-2023 cystoscopy  Consultants: 09-28-2023 urology   Hospital Course by Problem: [name] [name] hematuria 09-29-2023 remains on CBI per urology. 09-30-2023 urology notes reviewed. Plans for cysto tomorrow. NPO after MN. Type and screen should be good until midnight tomorrow night if he needs blood. 10-01-2023 for cysto today. Possible resection of more tumor. 10-02-2023 s/p cysto yesterday with more tumor removal and fulguration. Ready for DC today.  Acute blood loss anemia (ABLA) 09-29-2023 follow Hgb. Pt does not need any IV meds at this point. Pt aware he may need PRBC transfusion. He denies being  jehovah's witness. He agrees  to accept PRBC transfusion if needed.  09-30-2023 will repeat CBC in AM. Type and screen good until 2359 on 10-01-2023. 10-01-2023 HgB stable. Does not require PRBC transfusion at this time 10-02-2023 DC HgB 9.7 g/dl  Hypothyroidism [phone] continue synthroid. 09-30-2023 stable  Prediabetes 09-29-2023 stable. 09-30-2023 stable  Mixed hyperlipidemia 09-29-2023 stable. 09-30-2023 stable  GERD (gastroesophageal reflux disease) 09-29-2023 remains on ppi and bicarb 09-30-2023 stable  Essential hypertension 09-29-2023 stable. 09-30-2023 stable 10-01-2023 stable. Continue norvasc 10 mg at bedtime.  Sleep apnea treated with nocturnal BiPAP 09-29-2023 pt states he no longer wear CPAP at night.  Malignant neoplasm of urinary bladder [facility]) Management per urology 09-30-2023 plan for cysto in OR tomorrow. 10-01-2023 to OR today 10-02-2023 more biopsy taken yesterday during surgery. Path pending.  CKD (chronic kidney disease) stage 4, GFR 15-29 ml/min (HCC) - Baseline Scr 3.0 09-29-2023 stable. Baseline Scr 3.0 09-30-2023 repeat CMP in AM. 10-01-2023 Scr stable at 2.95 10-02-2023 DC Scr 3.22  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 stable    Discharge Diagnoses:  Principal Problem:   Gross hematuria Active Problems:   Acute blood loss anemia (ABLA)   CKD (chronic kidney disease) stage 4, GFR 15-29 ml/min (HCC) - Baseline Scr 3.0   Malignant neoplasm of urinary bladder (HCC)   Sleep apnea treated with nocturnal BiPAP   Essential hypertension   GERD (gastroesophageal reflux disease)   Mixed hyperlipidemia   Prediabetes   Hypothyroidism   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   Discharge Instructions  Discharge Instructions     Call MD for:  difficulty breathing, headache or visual disturbances   Complete by: As directed    Call MD for:  extreme fatigue   Complete by: As directed    Call MD for:  persistant nausea and vomiting   Complete by: As  directed    Call MD for:  severe uncontrolled pain   Complete by: As directed    Call MD for:  temperature >100.4   Complete by: As directed    Diet - low sodium heart healthy   Complete by: As directed    Discharge instructions   Complete by: As directed    1. Follow up with your primary care provider in 1-2 weeks following discharge from hospital 2. Follow up with Dr. Berneice Heinrich with urology as previously scheduled.   Increase activity slowly   Complete by: As directed       Allergies as of 10/02/2023       Reactions   Macrobid [nitrofurantoin] Shortness Of Breath   Ciprofloxacin Diarrhea, Other (See Comments)   Occurred with 750 mg dose. Has since taken 500 mg doses and has had no reaction/diarrhea.   Keytruda [pembrolizumab] Other (See Comments)   Weakness in both legs and fatigue   Tramadol Hcl Other (See Comments)   Pt unsure of reaction    Cephalexin Rash, Other (See Comments)   Rash on arms   [name] [mitomycin] Rash, Other (See Comments)   All over body RASH; possibly triggered kidney REMOVAL and RESTLESS LEGS also        Medication List     TAKE these medications    amLODipine 10 MG tablet Commonly known as: NORVASC Take 1 tablet (10 mg total) by mouth daily. What changed: when to take this   aspirin EC 81 MG tablet Take 81 mg by mouth every evening. Swallow whole.   ferrous sulfate 325 (65 FE) MG tablet Take 325 mg by mouth daily with  supper.   finasteride 5 MG tablet Commonly known as: PROSCAR TAKE ONE TABLET BY MOUTH DAILY What changed: when to take this   levocetirizine 5 MG tablet Commonly known as: XYZAL Take 5 mg by mouth daily as needed (for itching).   levothyroxine 50 MCG tablet Commonly known as: SYNTHROID Take 50 mcg by mouth daily before breakfast.   lidocaine-prilocaine cream Commonly known as: EMLA Apply to affected area once   Myrbetriq 50 MG Tb24 tablet Generic drug: mirabegron ER Take 50 mg by mouth at bedtime.    ondansetron 8 MG tablet Commonly known as: Zofran Take 1 tablet (8 mg total) by mouth every 8 (eight) hours as needed for nausea or vomiting.   oxyCODONE 5 MG immediate release tablet Commonly known as: Roxicodone Take 1 tablet (5 mg total) by mouth every 6 (six) hours as needed for moderate pain or severe pain (Post-operatively).   pantoprazole 20 MG tablet Commonly known as: PROTONIX Take 20 mg by mouth at bedtime.   prochlorperazine 10 MG tablet Commonly known as: COMPAZINE Take 1 tablet (10 mg total) by mouth every 6 (six) hours as needed for nausea or vomiting.   psyllium 58.6 % powder Commonly known as: METAMUCIL Take 0.5 packets by mouth every evening.   rosuvastatin 5 MG tablet Commonly known as: CRESTOR Take 5 mg by mouth at bedtime.   senna-docusate 8.6-50 MG tablet Commonly known as: Senokot-S Take 1 tablet by mouth 2 (two) times daily. While taking strong pain meds to prevent constipation What changed:  when to take this reasons to take this   silodosin 8 MG Caps capsule Commonly known as: RAPAFLO TAKE ONE CAPSULE BY MOUTH EVERY NIGHT AT BEDTIME   sodium bicarbonate 650 MG tablet Take 650 mg by mouth See admin instructions. Take 650 mg by mouth at noon and midnight   Tylenol 8 Hour Arthritis Pain 650 MG CR tablet Generic drug: acetaminophen Take 1,300 mg by mouth every 8 (eight) hours as needed for pain.        Follow-up Information     Berneice Heinrich Delbert Phenix., MD Follow up on 11/23/2023.   Specialty: Urology Why: at 8 AM for MD visit. Contact information: [address] [place] [address] [phone]                Allergies  Allergen Reactions   Macrobid [Nitrofurantoin] Shortness Of Breath   Ciprofloxacin Diarrhea and Other (See Comments)    Occurred with 750 mg dose. Has since taken 500 mg doses and has had no reaction/diarrhea.   Keytruda [Pembrolizumab] Other (See Comments)    Weakness in both legs and fatigue   Tramadol Hcl  Other (See Comments)    Pt unsure of reaction    Cephalexin Rash and Other (See Comments)    Rash on arms   [name] [Mitomycin] Rash and Other (See Comments)    All over body RASH; possibly triggered kidney REMOVAL and RESTLESS LEGS also    Discharge Exam: Vitals:   10/02/23 0513 10/02/23 0856  BP: (!) 102/58 108/69  Pulse: 80 83  Resp: 20 14  Temp: 98.3 F (36.8 C) 97.7 F (36.5 C)  SpO2: 97% 98%    Physical Exam Vitals and nursing note reviewed.  Constitutional:      General: He is not in acute distress.    Appearance: He is normal weight. He is not toxic-appearing or diaphoretic.  HENT:     Head: Normocephalic and atraumatic.     Nose:  Nose normal.  Eyes:     General: No scleral icterus. Pulmonary:     Effort: No respiratory distress.  Abdominal:     General: There is no distension.  Genitourinary:    Comments: Foley has been removed Musculoskeletal:     Right lower leg: No edema.     Left lower leg: No edema.  Neurological:     General: No focal deficit present.     Mental Status: He is alert and oriented to person, place, and time.     The results of significant diagnostics from this hospitalization (including imaging, microbiology, ancillary and laboratory) are listed below for reference.     Labs:  Basic Metabolic Panel: Recent Labs  Lab 09/28/23 1055 09/29/23 0406 10/01/23 0423 10/02/23 0457  NA 138 140 137 134*  K 4.1 3.9 4.3 4.5  CL 107 109 106 104  CO2 21* 21* 22 20*  GLUCOSE 104* 101* 115* 192*  BUN 55* 53* 58* 63*  CREATININE 2.97* 2.96* 2.95* 3.22*  CALCIUM 9.3 8.8* 8.9 8.8*   Liver Function Tests: Recent Labs  Lab 09/29/23 0406 10/01/23 0423 10/02/23 0457  AST 13* 15 14*  ALT 14 15 13   ALKPHOS 57 62 59  BILITOT 0.7 0.8 0.5  PROT 6.5 6.8 6.7  ALBUMIN 3.4* 3.4* 3.5    CBC: Recent Labs  Lab 09/28/23 1055 09/28/23 1713 09/28/23 2133 09/29/23 0406 10/01/23 0423 10/02/23 0457  WBC 7.2  --   --  7.3 8.2 7.2  NEUTROABS  6.0  --   --   --  6.4 6.6  HGB 10.8* 11.0* 9.7* 9.4* 9.5* 9.7*  HCT 32.4* 33.2* 29.9* 28.8* 28.7* 29.5*  MCV 91.3  --   --  92.9 91.4 90.2  PLT 257  --   --  235 240 268   Urinalysis    Component Value Date/Time   COLORURINE RED (A) 09/28/2023 1055   APPEARANCEUR TURBID (A) 09/28/2023 1055   LABSPEC  09/28/2023 1055    TEST NOT REPORTED DUE TO COLOR INTERFERENCE OF URINE PIGMENT   PHURINE  09/28/2023 1055    TEST NOT REPORTED DUE TO COLOR INTERFERENCE OF URINE PIGMENT   GLUCOSEU (A) 09/28/2023 1055    TEST NOT REPORTED DUE TO COLOR INTERFERENCE OF URINE PIGMENT   HGBUR (A) 09/28/2023 1055    TEST NOT REPORTED DUE TO COLOR INTERFERENCE OF URINE PIGMENT   BILIRUBINUR (A) 09/28/2023 1055    TEST NOT REPORTED DUE TO COLOR INTERFERENCE OF URINE PIGMENT   KETONESUR (A) 09/28/2023 1055    TEST NOT REPORTED DUE TO COLOR INTERFERENCE OF URINE PIGMENT   PROTEINUR (A) 09/28/2023 1055    TEST NOT REPORTED DUE TO COLOR INTERFERENCE OF URINE PIGMENT   NITRITE (A) 09/28/2023 1055    TEST NOT REPORTED DUE TO COLOR INTERFERENCE OF URINE PIGMENT   LEUKOCYTESUR (A) 09/28/2023 1055    TEST NOT REPORTED DUE TO COLOR INTERFERENCE OF URINE PIGMENT   Sepsis Labs Recent Labs  Lab 09/28/23 1055 09/29/23 0406 10/01/23 0423 10/02/23 0457  WBC 7.2 7.3 8.2 7.2    Procedures/Studies: DG C-Arm 1-60 Min-No Report  Result Date: 10/01/2023 Fluoroscopy was utilized by the requesting physician.  No radiographic interpretation.   CT Renal Stone Study  Result Date: 09/28/2023 CLINICAL DATA:  Hematuria. 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 EXAM: CT ABDOMEN AND PELVIS WITHOUT CONTRAST TECHNIQUE: Multidetector CT imaging of the abdomen and pelvis was performed following the standard protocol without IV contrast. RADIATION DOSE  REDUCTION: This exam was performed according to the departmental dose-optimization program which includes automated exposure control, adjustment of the mA and/or kV  according to patient size and/or use of iterative reconstruction technique. COMPARISON:  CT abdomen/pelvis dated March 17, 2023. FINDINGS: Lower chest: No acute abnormality. Hepatobiliary: No suspicious focal liver lesion, within the limits of a noncontrast exam. Probable small calcified gallstone. No gallbladder wall thickening. No biliary dilatation. Pancreas: Unremarkable. No pancreatic ductal dilatation or surrounding inflammatory changes. Spleen: Normal in size without focal abnormality. Adrenals/Urinary Tract: The adrenal glands are unremarkable. Status post left nephrectomy. No suspicious focal soft tissue or collection to suggest recurrence. Normal noncontrast appearance of the right kidney. No suspicious focal lesion, within the limits of an noncontrast exam. Punctate nonobstructive 2 mm calculus in the inferior pole of the right kidney. No hydronephrosis. There is mild haziness and bladder wall thickening at the superior bladder dome. Stomach/Bowel: Stomach is within normal limits. Appendix appears normal. No evidence of obstruction. Descending and sigmoid colonic diverticulosis without evidence of acute diverticulitis. Vascular/Lymphatic: Aortic atherosclerosis. Similar prominent left periaortic nodes, not definitively enlarged by size criteria. Reproductive: Prostatomegaly.  Evidence of prior TURP. Other: Small fat containing umbilical hernia. No abdominopelvic ascites. Musculoskeletal: No acute osseous abnormality. Severe degenerative changes of the right hip resulting in bone-on-bone contact, subchondral sclerosis and prominent subchondral cystic changes. Moderate osteoarthritis of the left hip. Multilevel degenerative changes of the spine. IMPRESSION: 1. Status post left nephrectomy. No evidence of recurrence identified within the limits of a noncontrast examination. 2. Punctate nonobstructive 2 mm stone in the inferior pole of the right kidney. No hydronephrosis. 3. Similar prominent left periaortic  nodes, not definitively enlarged by size criteria, are nonspecific. 4. Descending and sigmoid colonic diverticulosis without evidence of acute diverticulitis. Electronically Signed   By: Hart Robinsons M.D.   On: 09/28/2023 15:45    Time coordinating discharge: 40 mins  SIGNED:  Carollee Herter, DO [facility] 10/02/23, 12:11 PM

## 2023-10-02 NOTE — Plan of Care (Signed)
  Problem: Safety: Goal: Ability to remain free from injury will improve 10/02/2023 0606 by Thea Gist, RN Outcome: Progressing 10/02/2023 0606 by Thea Gist, RN Outcome: Progressing   Problem: Skin Integrity: Goal: Risk for impaired skin integrity will decrease 10/02/2023 0606 by Thea Gist, RN Outcome: Progressing 10/02/2023 0606 by Thea Gist, RN Outcome: Progressing   Problem: Pain Management: Goal: General experience of comfort will improve 10/02/2023 0606 by Thea Gist, RN Outcome: Progressing 10/02/2023 0606 by Thea Gist, RN Outcome: Progressing   Problem: Elimination: Goal: Will not experience complications related to urinary retention 10/02/2023 0606 by Thea Gist, RN Outcome: Progressing 10/02/2023 0606 by Thea Gist, RN Outcome: Progressing   Problem: Nutrition: Goal: Adequate nutrition will be maintained 10/02/2023 0606 by Thea Gist, RN Outcome: Progressing 10/02/2023 0606 by Thea Gist, RN Outcome: Progressing   Problem: Clinical Measurements: Goal: Ability to maintain clinical measurements within normal limits will improve 10/02/2023 0606 by Thea Gist, RN Outcome: Progressing 10/02/2023 0606 by Thea Gist, RN Outcome: Progressing   Problem: Education: Goal: Knowledge of General Education information will improve Description: Including pain rating scale, medication(s)/side effects and non-pharmacologic comfort measures 10/02/2023 0606 by Thea Gist, RN Outcome: Progressing 10/02/2023 0606 by Thea Gist, RN Outcome: Progressing

## 2023-10-05 ENCOUNTER — Telehealth: Payer: Self-pay

## 2023-10-05 LAB — SURGICAL PATHOLOGY

## 2023-10-05 NOTE — Transitions of Care (Post Inpatient/ED Visit) (Addendum)
10/05/2023  Name: [name] [name] MRN: [id number] DOB: September 09, 1932  Today's TOC FU Call Status: Today's TOC FU Call Status:: Successful TOC FU Call Completed TOC FU Call Complete Date: 10/05/23 Patient's Name and Date of Birth confirmed.  Transition Care Management Follow-up Telephone Call Date of Discharge: 10/02/23 Discharge Facility: [facility]) Type of Discharge: Inpatient Admission Primary Inpatient Discharge Diagnosis:: Gross hematuria How have you been since you were released from the hospital?: Better (reports problems with urinary incontience) Any questions or concerns?: No  Items Reviewed: Did you receive and understand the discharge instructions provided?: Yes Medications obtained,verified, and reconciled?: Yes (Medications Reviewed) Any new allergies since your discharge?: No Dietary orders reviewed?: Yes Type of Diet Ordered:: heart healthy Do you have support at home?: Yes People in Home: child(ren), adult Name of Support/Comfort Primary Source: son and daughter in law  Medications Reviewed Today: Medications Reviewed Today     Reviewed by Earlie Server, RN (Registered Nurse) on 10/05/23 at 1523  Med List Status: <None>   Medication Order Taking? Sig Documenting Provider Last Dose Status Informant  amLODipine (NORVASC) 10 MG tablet [id number] Yes Take 1 tablet (10 mg total) by mouth daily.  Patient taking differently: Take 10 mg by mouth at bedtime.   Almon Hercules, MD Taking Active Self  aspirin EC 81 MG tablet [id number] Yes Take 81 mg by mouth every evening. Swallow whole. [provider] Taking Active Self  ferrous sulfate 325 (65 FE) MG tablet [id number] Yes Take 325 mg by mouth daily with supper. [provider] Taking Active Self  finasteride (PROSCAR) 5 MG tablet [id number] Yes TAKE ONE TABLET BY MOUTH DAILY  Patient taking differently: Take 5 mg by mouth at bedtime.   McKenzie, Mardene Celeste, MD Taking Active Self  levocetirizine  (XYZAL) 5 MG tablet [id number] No Take 5 mg by mouth daily as needed (for itching).  Patient not taking: Reported on 10/05/2023   [provider] Not Taking Active Self  levothyroxine (SYNTHROID, LEVOTHROID) 50 MCG tablet [id number] Yes Take 50 mcg by mouth daily before breakfast. [provider] Taking Active Self           Med Note Gasper Lloyd, ANAIS   Sat Sep 11, 2017  8:33 AM)    lidocaine-prilocaine (EMLA) cream [id number] No Apply to affected area once  Patient not taking: Reported on 09/28/2023   [name], MD Not Taking Active Self  MYRBETRIQ 50 MG TB24 tablet [id number] Yes Take 50 mg by mouth at bedtime. [provider] Taking Active Self  ondansetron (ZOFRAN) 8 MG tablet [id number] No Take 1 tablet (8 mg total) by mouth every 8 (eight) hours as needed for nausea or vomiting.  Patient not taking: Reported on 10/05/2023   [name], MD Not Taking Active Self  oxyCODONE (ROXICODONE) 5 MG immediate release tablet [id number] No Take 1 tablet (5 mg total) by mouth every 6 (six) hours as needed for moderate pain or severe pain (Post-operatively).  Patient not taking: Reported on 09/28/2023   Loletta Parish., MD Not Taking Active Self  pantoprazole (PROTONIX) 20 MG tablet [id number] Yes Take 20 mg by mouth at bedtime. [provider] Taking Active Self           Med Note Gasper Lloyd, ANAIS   Sat Sep 11, 2017  8:33 AM)    prochlorperazine (COMPAZINE) 10 MG tablet [id number] No Take 1 tablet (10 mg total) by mouth every 6 (six) hours as needed for nausea  or vomiting.  Patient not taking: Reported on 10/05/2023   [name], MD Not Taking Active Self  psyllium (METAMUCIL) 58.6 % powder [id number] Yes Take 0.5 packets by mouth every evening. [provider] Taking Active Self  rosuvastatin (CRESTOR) 5 MG tablet [id number] Yes Take 5 mg by mouth at bedtime. [provider] Taking Active Self  senna-docusate (SENOKOT-S) 8.6-50 MG tablet  [id number] No Take 1 tablet by mouth 2 (two) times daily. While taking strong pain meds to prevent constipation  Patient not taking: Reported on 10/05/2023   Loletta Parish., MD Not Taking Active Self  silodosin (RAPAFLO) 8 MG CAPS capsule [id number] Yes TAKE ONE CAPSULE BY MOUTH EVERY NIGHT AT BEDTIME Malen Gauze, MD Taking Active Self  sodium bicarbonate 650 MG tablet [id number] Yes Take 650 mg by mouth See admin instructions. Take 650 mg by mouth at noon and midnight [provider] Taking Active Self  TYLENOL 8 HOUR ARTHRITIS PAIN 650 MG CR tablet [id number] No Take 1,300 mg by mouth every 8 (eight) hours as needed for pain.  Patient not taking: Reported on 10/05/2023   [provider] Not Taking Active Self            Home Care and Equipment/Supplies: Were Home Health Services Ordered?: NA Any new equipment or medical supplies ordered?: NA  Functional Questionnaire: Do you need assistance with bathing/showering or dressing?: No Do you need assistance with meal preparation?: Yes (family) Do you need assistance with eating?: No Do you have difficulty maintaining continence: Yes Do you need assistance with getting out of bed/getting out of a chair/moving?: No Do you have difficulty managing or taking your medications?: No  Follow up appointments reviewed: PCP Follow-up appointment confirmed?: No Specialist Hospital Follow-up appointment confirmed?: Yes Date of Specialist follow-up appointment?: 10/08/23 Follow-Up Specialty Provider:: Oncology Do you need transportation to your follow-up appointment?: No Do you understand care options if your condition(s) worsen?: Yes-patient verbalized understanding  SDOH Interventions Today    Flowsheet Row Most Recent Value  SDOH Interventions   Food Insecurity Interventions Intervention Not Indicated  Housing Interventions Intervention Not Indicated  Transportation Interventions Intervention Not Indicated   Utilities Interventions Intervention Not Indicated      Patient reports that he is doing well. Complains of increased fatigue.  Reports that he is eating and drinking well.  Reports he is having his normal amounts of sleep.  Reports no follow up PCP appointment.   Reviewed importance of follow up with PCP.    Goals Addressed               This Visit's Progress     Patient will report no readmissions to the hospital in the next 30 days. (pt-stated)        Current Barriers:  Provider appointments no PCP follow up schedule Recent bladder evacuation and hematuria, reports weakness  RNCM Clinical Goal(s):  Patient will work with the Care Management team over the next 30 days to address Transition of Care Barriers: Provider appointments Patient will be observant for increased blood in urine or change in condition take all medications exactly as prescribed and will call provider for medication related questions as evidenced by patient report attend all scheduled medical appointments: PCP and specialist as evidenced by patient report and review of EMR  through collaboration with RN Care manager, provider, and care team.   Interventions: Evaluation of current treatment plan related to  self management and patient's adherence to plan as established  by provider  Transitions of Care:  New goal. Doctor Visits  - discussed the importance of doctor visits Reviewed Signs and symptoms of infection Reviewed importance of be observant for changes in urinary flow, increased pain and bloody urine  Oncology:  (Status: New goal.) Short Term Goal Assessment of understanding of oncology diagnosis:  Reviewed upcoming provider appointments and treatment appointments Assessed available transportation to appointments and treatments. Has consistent/reliable transportation: Yes Assessed support system. Has consistent/reliable family or other support: Yes  Patient Goals/Self-Care Activities: Participate  in Transition of Care Program/Attend [facility] scheduled calls Notify RN Care Manager of TOC call rescheduling needs Take all medications as prescribed Attend all scheduled provider appointments Call provider office for new concerns or questions  Provided my contact information for patient to call me if needed prior to his next scheduled TOC call.   Follow Up Plan:  Telephone follow up appointment with care management team member scheduled for:  10/12/2023          Lonia Chimera, RN, BSN, CEN Population Health- Transition of Care Team.  [facility] [phone]

## 2023-10-06 DIAGNOSIS — R319 Hematuria, unspecified: Secondary | ICD-10-CM | POA: Diagnosis not present

## 2023-10-06 DIAGNOSIS — D509 Iron deficiency anemia, unspecified: Secondary | ICD-10-CM | POA: Diagnosis not present

## 2023-10-06 DIAGNOSIS — C679 Malignant neoplasm of bladder, unspecified: Secondary | ICD-10-CM | POA: Diagnosis not present

## 2023-10-06 DIAGNOSIS — I129 Hypertensive chronic kidney disease with stage 1 through stage 4 chronic kidney disease, or unspecified chronic kidney disease: Secondary | ICD-10-CM | POA: Diagnosis not present

## 2023-10-06 DIAGNOSIS — E872 Acidosis, unspecified: Secondary | ICD-10-CM | POA: Diagnosis not present

## 2023-10-06 DIAGNOSIS — N184 Chronic kidney disease, stage 4 (severe): Secondary | ICD-10-CM | POA: Diagnosis not present

## 2023-10-06 DIAGNOSIS — Z905 Acquired absence of kidney: Secondary | ICD-10-CM | POA: Diagnosis not present

## 2023-10-06 DIAGNOSIS — N2581 Secondary hyperparathyroidism of renal origin: Secondary | ICD-10-CM | POA: Diagnosis not present

## 2023-10-06 DIAGNOSIS — E875 Hyperkalemia: Secondary | ICD-10-CM | POA: Diagnosis not present

## 2023-10-07 NOTE — Assessment & Plan Note (Signed)
Will repeat ferritin with recent hematuria and previously borderline level

## 2023-10-07 NOTE — Progress Notes (Signed)
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 If feeling well, and AKI resolved may resume treatment   Low serum vitamin B12 Continue B12 500 mcg daily   Gross hematuria Will repeat ferritin with recent hematuria and previously borderline level  AKI (acute kidney injury) (HCC) Labs on Monday Hold treatment Report to ED if unable to urinate, nausea, profound fatigue Increase fluid to 60-70 oz per day   BMP on Monday, Friday. Postpone treatment to next Friday with labs and MD visit.  The patient understands the plans discussed today and is in agreement with them.  He knows to contact our office if he develops concerns prior to his next appointment.   [name], MD  [facility]  [facility] [address] [place] [address] Dept: [phone] Dept Fax: [phone]   Orders Placed This Encounter  Procedures   Ferritin    Standing Status:   Future    Number of Occurrences:   1    Standing Expiration Date:   10/06/2024   Ferritin    Standing Status:   Future    Standing Expiration Date:   10/27/2024   CBC with Differential (Cancer Center Only)    Standing Status:   Future    Standing Expiration Date:   10/27/2024   CMP (Cancer Center only)    Standing Status:   Future    Standing Expiration Date:   10/27/2024   Basic Metabolic Panel -  Cancer Center Only    Standing Status:   Future    Standing Expiration Date:   10/07/2024   TSH    Standing Status:   Future    Standing Expiration Date:   10/07/2024   T4, free    Standing Status:   Future    Standing Expiration Date:   10/07/2024      CHIEF COMPLAINT:  CC: UC  Current Treatment:  pembro for recurrent Ta  INTERVAL HISTORY:  Khyaire is here today for repeat clinical assessment. He recently presented with hematuria.  TURBT was performed of the noninvasive UC. He has only seen trace of blood since. Overall feeling well.  Generalized weakness. No rash. No diarrhea. No significant coughing. No short of breath other than exertion. No numbness or tingling. No headache.  Past Medical History:  Diagnosis Date   Anemia    Arthritis    lower back bulging disc, hips, knees, thumbs, shoulder   Asthma    as a pre teen   Bladder cancer (HCC) UROLOGIST-  DR MCKENZIE   S/P TURBT 08-16-2017   Bladder wall hemorrhage 09/28/2023   BPH (benign prostatic hyperplasia)    CKD (chronic kidney disease), stage III (HCC)    STAGE 3  B CKD per lov 10-16-2020 dr Thyra Breed Everlene Balls kidney on chart   Dyspnea    Fatigue    MIDDLE OF DAY ON OCCASION   Frequency of  urination    GERD (gastroesophageal reflux disease)    on protonix   Hematuria    Hematuria    History of colon polyps    HLD (hyperlipidemia)    HOH (hard of hearing)    slightly hoh right worse thanleft   HTN (hypertension)    Hypothyroidism    Incontinence of urine    weras pads all the time   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   Right Buccal Cheek   Pre-diabetes    Recurrent bladder papillary carcinoma (HCC) hx 2012 and 2014-- urologist- dr Ronne Binning   s/p  TURBT 08-16-2017  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   Renal mass 09/02/2022   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 10/02/2023   Sleep apnea    uses cpap set on 20 /4   Trigger finger of left hand  07/04/2021   last few months per pt   UTI (urinary tract infection) 08/20/2020   Weakness of extremity    left knee   Wears glasses     ALLERGIES:  is allergic to macrobid [nitrofurantoin], ciprofloxacin, keytruda [pembrolizumab], tramadol hcl, cephalexin, and jelmyto [mitomycin].  MEDICATIONS:  Current Outpatient Medications  Medication Sig Dispense Refill   amLODipine (NORVASC) 10 MG tablet Take 1 tablet (10 mg total) by mouth daily. (Patient taking differently: Take 10 mg by mouth at bedtime.) 90 tablet 1   aspirin EC 81 MG tablet Take 81 mg by mouth every evening. Swallow whole.     ferrous sulfate 325 (65 FE) MG tablet Take 325 mg by mouth daily with supper.     finasteride (PROSCAR) 5 MG tablet TAKE ONE TABLET BY MOUTH DAILY (Patient taking differently: Take 5 mg by mouth at bedtime.) 90 tablet 3   levocetirizine (XYZAL) 5 MG tablet Take 5 mg by mouth daily as needed (for itching). (Patient not taking: Reported on 10/05/2023)     levothyroxine (SYNTHROID, LEVOTHROID) 50 MCG tablet Take 50 mcg by mouth daily before breakfast.     lidocaine-prilocaine (EMLA) cream Apply to affected area once (Patient not taking: Reported on 09/28/2023) 30 g 3   MYRBETRIQ 50 MG TB24 tablet Take 50 mg by mouth at bedtime.     ondansetron (ZOFRAN) 8 MG tablet Take 1 tablet (8 mg total) by mouth every 8 (eight) hours as needed for nausea or vomiting. (Patient not taking: Reported on 10/05/2023) 30 tablet 1   oxyCODONE (ROXICODONE) 5 MG immediate release tablet Take 1 tablet (5 mg total) by mouth every 6 (six) hours as needed for moderate pain or severe pain (Post-operatively). (Patient not taking: Reported on 09/28/2023) 10 tablet 0   pantoprazole (PROTONIX) 20 MG tablet Take 20 mg by mouth at bedtime.     prochlorperazine (COMPAZINE) 10 MG tablet Take 1 tablet (10 mg total) by mouth every 6 (six) hours as needed for nausea or vomiting. (Patient not taking: Reported on 10/05/2023) 30 tablet 1   psyllium  (METAMUCIL) 58.6 % powder Take 0.5 packets by mouth every evening.     rosuvastatin (CRESTOR) 5 MG tablet Take 5 mg by mouth at bedtime.     senna-docusate (SENOKOT-S) 8.6-50 MG tablet Take 1 tablet by mouth 2 (two) times daily. While taking strong pain meds to prevent constipation (Patient not taking: Reported on 10/05/2023) 10 tablet 0   silodosin (RAPAFLO) 8 MG CAPS capsule TAKE ONE CAPSULE BY MOUTH EVERY NIGHT AT BEDTIME 90 capsule 3   sodium bicarbonate 650 MG tablet Take 650 mg  by mouth See admin instructions. Take 650 mg by mouth at noon and midnight     TYLENOL 8 HOUR ARTHRITIS PAIN 650 MG CR tablet Take 1,300 mg by mouth every 8 (eight) hours as needed for pain. (Patient not taking: Reported on 10/05/2023)     No current facility-administered medications for this visit.   Facility-Administered Medications Ordered in Other Visits  Medication Dose Route Frequency Provider Last Rate Last Admin   0.9 %  sodium chloride infusion   Intravenous Once [name], MD 500 mL/hr at 10/08/23 0956 New Bag at 10/08/23 0956    HISTORY OF PRESENT ILLNESS:   Oncology History Overview Note  2000 BPH with elevated PSA. Report biopsy was benign.   2011 to now with multiple TURBT   2021 TaG3 bladder cancer. TURBT + BCG x 6  2023 TaG1 bladder + left renal pelvis cancer, failed [name] >>  08/2022 Left nephroureterectomy + RPLND. 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  06/2023 cysto w/ TURBT multifocal papillary bladder tumor recurrence (dome, left trigone, bladder neck, prostate urethral tissue) ==> TaG3 with negative  muscle  Path: A. BLADDER TUMOR, TURBT:  Noninvasive high grade papillary urothelial carcinoma with inverted  growth pattern  muscularis propria (detrusor muscle) is present and not involved by  carcinoma   B. BLADDER TUMOR, NECK, BIOPSY:  Noninvasive high-grade papillary urothelial carcinoma with inverted  growth pattern  Submucosa, prostatic glands and muscularis is present and not involved  by carcinoma   C. BLADDER TUMOR, BASE, BIOPSY:  Noninvasive high grade papillary urothelial carcinoma  Muscularis propria is present and not involved by carcinoma       Malignant neoplasm of urinary bladder (HCC)  09/15/2019 Initial Diagnosis   Malignant neoplasm of urinary bladder (HCC)   08/05/2023 Cancer Staging   Staging form: Urinary Bladder, AJCC 8th Edition - Clinical: Stage 0a (rcTa, cN0, cM0) - Signed by [name], MD on 08/05/2023 Stage prefix: Recurrence WHO/ISUP grade (low/high): High Grade Histologic grading system: 2 grade system   09/16/2023 -  Chemotherapy   Patient is on Treatment Plan : BLADDER Pembrolizumab (200) q21d     10/01/2023 Pathology Results   Presented with hematuria and underwent TURBT  A. BLADDER TUMOR, TURBT:  Noninvasive high grade papillary urothelial carcinoma  Muscularis propria (detrusor muscle) is not present        REVIEW OF SYSTEMS:   All relevant systems were reviewed with the patient and are negative.   VITALS:  Blood pressure 130/65, pulse 74, temperature (!) 97.5 F (36.4 C), temperature source Temporal, resp. rate 16, height 5\' 8"  (1.727 m), weight 164 lb 12.8 oz (74.8 kg), SpO2 100%.  Wt Readings from Last 3 Encounters:  10/08/23 164 lb 12.8 oz (74.8 kg)  09/28/23 165 lb 5.5 oz (75 kg)  09/22/23 166 lb (75.3 kg)    Body mass index is 25.06 kg/m.  Performance status (ECOG): 1 - Symptomatic but completely ambulatory  PHYSICAL EXAM:   GENERAL:alert, no distress and comfortable SKIN: skin color normal, no rashes   EYES: normal, sclera clear OROPHARYNX: no exudate, no erythema    NECK: supple,  non-tender,  without nodularity LUNGS: clear to auscultation with normal breathing effort.  No wheeze or rales HEART: regular rate & rhythm and no murmurs and no lower extremity edema ABDOMEN: abdomen soft, non-tender and nondistended Musculoskeletal: no edema NEURO: alert, fluent speech  LABORATORY DATA:  I have reviewed the data as listed    Component Value Date/Time   NA 139 10/08/2023 0816   K 4.4 10/08/2023 0816   CL 108 10/08/2023 0816   CO2 21 (L) 10/08/2023 0816   GLUCOSE 91 10/08/2023 0816   BUN 81 (H) 10/08/2023 0816   CREATININE 4.07 (H) 10/08/2023 0816   CALCIUM 9.1 10/08/2023 0816   PROT 7.5 10/08/2023 0816   ALBUMIN 4.1 10/08/2023 0816   AST 13 (L) 10/08/2023 0816   ALT 11 10/08/2023 0816   ALKPHOS 69 10/08/2023 0816   BILITOT 0.5 10/08/2023 0816   GFRNONAA 13 (L) 10/08/2023 0816   GFRAA 35 (L) 08/23/2020 0524    No results found for: "SPEP", "UPEP"  Lab Results  Component Value Date   WBC 9.4 10/08/2023   NEUTROABS 7.3 10/08/2023   HGB 9.9 (L) 10/08/2023   HCT 29.9 (L) 10/08/2023   MCV 90.9 10/08/2023   PLT 306 10/08/2023      Chemistry      Component Value Date/Time   NA 139 10/08/2023 0816   K 4.4 10/08/2023 0816   CL 108 10/08/2023 0816   CO2 21 (L) 10/08/2023 0816   BUN 81 (H) 10/08/2023 0816   CREATININE 4.07 (H) 10/08/2023 0816      Component Value Date/Time   CALCIUM 9.1 10/08/2023 0816   ALKPHOS 69 10/08/2023 0816   AST 13 (L) 10/08/2023 0816   ALT 11 10/08/2023 0816   BILITOT 0.5 10/08/2023 0816       RADIOGRAPHIC STUDIES: I have personally reviewed the radiological images as listed and agreed with the findings in the report. DG C-Arm 1-60 Min-No Report  Result Date: 10/01/2023 Fluoroscopy was utilized by the requesting physician.  No radiographic interpretation.   CT Renal Stone Study  Result Date: 09/28/2023 CLINICAL DATA:  Hematuria. Left  ureteral cancer status post left nephrectomy. EXAM: CT ABDOMEN AND PELVIS WITHOUT CONTRAST TECHNIQUE: Multidetector CT imaging of the abdomen and pelvis was performed following the standard protocol without IV contrast. RADIATION DOSE REDUCTION: This exam was performed according to the departmental dose-optimization program which includes automated exposure control, adjustment of the mA and/or kV according to patient size and/or use of iterative reconstruction technique. COMPARISON:  CT abdomen/pelvis dated March 17, 2023. FINDINGS: Lower chest: No acute abnormality. Hepatobiliary: No suspicious focal liver lesion, within the limits of a noncontrast exam. Probable small calcified gallstone. No gallbladder wall thickening. No biliary dilatation. Pancreas: Unremarkable. No pancreatic ductal dilatation or surrounding inflammatory changes. Spleen: Normal in size without focal abnormality. Adrenals/Urinary Tract: The adrenal glands are unremarkable. Status post left nephrectomy. No suspicious focal soft tissue or collection to suggest recurrence. Normal noncontrast appearance of the right kidney. No suspicious focal lesion, within the limits of an noncontrast exam. Punctate nonobstructive 2 mm calculus in the inferior pole of the right kidney. No hydronephrosis. There is mild haziness and bladder wall thickening at the superior bladder dome. Stomach/Bowel: Stomach is within normal limits. Appendix appears normal. No evidence of obstruction. Descending and sigmoid colonic diverticulosis without evidence of acute diverticulitis. Vascular/Lymphatic: Aortic atherosclerosis. Similar prominent left periaortic nodes, not definitively enlarged by size criteria. Reproductive: Prostatomegaly.  Evidence of prior TURP. Other: Small fat containing umbilical hernia. No abdominopelvic  ascites. Musculoskeletal: No acute osseous abnormality. Severe degenerative changes of the right hip resulting in bone-on-bone contact, subchondral  sclerosis and prominent subchondral cystic changes. Moderate osteoarthritis of the left hip. Multilevel degenerative changes of the spine. IMPRESSION: 1. Status post left nephrectomy. No evidence of recurrence identified within the limits of a noncontrast examination. 2. Punctate nonobstructive 2 mm stone in the inferior pole of the right kidney. No hydronephrosis. 3. Similar prominent left periaortic nodes, not definitively enlarged by size criteria, are nonspecific. 4. Descending and sigmoid colonic diverticulosis without evidence of acute diverticulitis. Electronically Signed   By: Hart Robinsons M.D.   On: 09/28/2023 15:45

## 2023-10-07 NOTE — Assessment & Plan Note (Addendum)
Hold pembro today Return next week with labs on Monday and Friday and see me on Fri. If feeling well, and AKI resolved may resume treatment

## 2023-10-07 NOTE — Assessment & Plan Note (Signed)
Continue B12 500 mcg daily

## 2023-10-08 ENCOUNTER — Inpatient Hospital Stay: Payer: Medicare Other

## 2023-10-08 ENCOUNTER — Other Ambulatory Visit: Payer: Self-pay | Admitting: *Deleted

## 2023-10-08 ENCOUNTER — Inpatient Hospital Stay (HOSPITAL_BASED_OUTPATIENT_CLINIC_OR_DEPARTMENT_OTHER): Payer: Medicare Other

## 2023-10-08 VITALS — BP 130/65 | HR 74 | Temp 97.5°F | Resp 16 | Ht 68.0 in | Wt 164.8 lb

## 2023-10-08 DIAGNOSIS — D509 Iron deficiency anemia, unspecified: Secondary | ICD-10-CM | POA: Insufficient documentation

## 2023-10-08 DIAGNOSIS — C678 Malignant neoplasm of overlapping sites of bladder: Secondary | ICD-10-CM | POA: Insufficient documentation

## 2023-10-08 DIAGNOSIS — N179 Acute kidney failure, unspecified: Secondary | ICD-10-CM | POA: Diagnosis not present

## 2023-10-08 DIAGNOSIS — E039 Hypothyroidism, unspecified: Secondary | ICD-10-CM | POA: Diagnosis not present

## 2023-10-08 DIAGNOSIS — Z905 Acquired absence of kidney: Secondary | ICD-10-CM | POA: Insufficient documentation

## 2023-10-08 DIAGNOSIS — C662 Malignant neoplasm of left ureter: Secondary | ICD-10-CM | POA: Insufficient documentation

## 2023-10-08 DIAGNOSIS — Z87440 Personal history of urinary (tract) infections: Secondary | ICD-10-CM | POA: Diagnosis not present

## 2023-10-08 DIAGNOSIS — R310 Gross hematuria: Secondary | ICD-10-CM | POA: Diagnosis not present

## 2023-10-08 DIAGNOSIS — E860 Dehydration: Secondary | ICD-10-CM

## 2023-10-08 DIAGNOSIS — E538 Deficiency of other specified B group vitamins: Secondary | ICD-10-CM

## 2023-10-08 LAB — CMP (CANCER CENTER ONLY)
ALT: 11 U/L (ref 0–44)
AST: 13 U/L — ABNORMAL LOW (ref 15–41)
Albumin: 4.1 g/dL (ref 3.5–5.0)
Alkaline Phosphatase: 69 U/L (ref 38–126)
Anion gap: 10 (ref 5–15)
BUN: 81 mg/dL — ABNORMAL HIGH (ref 8–23)
CO2: 21 mmol/L — ABNORMAL LOW (ref 22–32)
Calcium: 9.1 mg/dL (ref 8.9–10.3)
Chloride: 108 mmol/L (ref 98–111)
Creatinine: 4.07 mg/dL — ABNORMAL HIGH (ref 0.61–1.24)
GFR, Estimated: 13 mL/min — ABNORMAL LOW (ref >60)
Glucose, Bld: 91 mg/dL (ref 70–99)
Potassium: 4.4 mmol/L (ref 3.5–5.1)
Sodium: 139 mmol/L (ref 135–145)
Total Bilirubin: 0.5 mg/dL (ref <1.2)
Total Protein: 7.5 g/dL (ref 6.5–8.1)

## 2023-10-08 LAB — CBC WITH DIFFERENTIAL (CANCER CENTER ONLY)
Abs Immature Granulocytes: 0.03 10*3/uL (ref 0.00–0.07)
Basophils Absolute: 0 10*3/uL (ref 0.0–0.1)
Basophils Relative: 0 %
Eosinophils Absolute: 0.4 10*3/uL (ref 0.0–0.5)
Eosinophils Relative: 4 %
HCT: 29.9 % — ABNORMAL LOW (ref 39.0–52.0)
Hemoglobin: 9.9 g/dL — ABNORMAL LOW (ref 13.0–17.0)
Immature Granulocytes: 0 %
Lymphocytes Relative: 7 %
Lymphs Abs: 0.7 10*3/uL (ref 0.7–4.0)
MCH: 30.1 pg (ref 26.0–34.0)
MCHC: 33.1 g/dL (ref 30.0–36.0)
MCV: 90.9 fL (ref 80.0–100.0)
Monocytes Absolute: 1 10*3/uL (ref 0.1–1.0)
Monocytes Relative: 10 %
Neutro Abs: 7.3 10*3/uL (ref 1.7–7.7)
Neutrophils Relative %: 79 %
Platelet Count: 306 10*3/uL (ref 150–400)
RBC: 3.29 MIL/uL — ABNORMAL LOW (ref 4.22–5.81)
RDW: 14.1 % (ref 11.5–15.5)
WBC Count: 9.4 10*3/uL (ref 4.0–10.5)
nRBC: 0 % (ref 0.0–0.2)

## 2023-10-08 LAB — FERRITIN: Ferritin: 29 ng/mL (ref 24–336)

## 2023-10-08 MED ORDER — SODIUM CHLORIDE 0.9 % IV SOLN: Freq: Once | INTRAVENOUS | Status: AC

## 2023-10-08 NOTE — Patient Instructions (Signed)

## 2023-10-08 NOTE — Assessment & Plan Note (Signed)
Labs on Monday Hold treatment Report to ED if unable to urinate, nausea, profound fatigue Increase fluid to 60-70 oz per day

## 2023-10-11 ENCOUNTER — Other Ambulatory Visit: Payer: Self-pay | Admitting: *Deleted

## 2023-10-11 ENCOUNTER — Inpatient Hospital Stay: Payer: Medicare Other

## 2023-10-11 DIAGNOSIS — E538 Deficiency of other specified B group vitamins: Secondary | ICD-10-CM | POA: Diagnosis not present

## 2023-10-11 DIAGNOSIS — C678 Malignant neoplasm of overlapping sites of bladder: Secondary | ICD-10-CM

## 2023-10-11 DIAGNOSIS — Z87440 Personal history of urinary (tract) infections: Secondary | ICD-10-CM | POA: Diagnosis not present

## 2023-10-11 DIAGNOSIS — C662 Malignant neoplasm of left ureter: Secondary | ICD-10-CM | POA: Diagnosis not present

## 2023-10-11 DIAGNOSIS — Z905 Acquired absence of kidney: Secondary | ICD-10-CM | POA: Diagnosis not present

## 2023-10-11 DIAGNOSIS — E039 Hypothyroidism, unspecified: Secondary | ICD-10-CM | POA: Diagnosis not present

## 2023-10-11 DIAGNOSIS — D509 Iron deficiency anemia, unspecified: Secondary | ICD-10-CM | POA: Diagnosis not present

## 2023-10-11 DIAGNOSIS — N179 Acute kidney failure, unspecified: Secondary | ICD-10-CM

## 2023-10-11 DIAGNOSIS — R310 Gross hematuria: Secondary | ICD-10-CM | POA: Diagnosis not present

## 2023-10-11 LAB — BASIC METABOLIC PANEL - CANCER CENTER ONLY
Anion gap: 12 (ref 5–15)
BUN: 67 mg/dL — ABNORMAL HIGH (ref 8–23)
CO2: 21 mmol/L — ABNORMAL LOW (ref 22–32)
Calcium: 9.3 mg/dL (ref 8.9–10.3)
Chloride: 107 mmol/L (ref 98–111)
Creatinine: 3.76 mg/dL — ABNORMAL HIGH (ref 0.61–1.24)
GFR, Estimated: 15 mL/min — ABNORMAL LOW (ref >60)
Glucose, Bld: 115 mg/dL — ABNORMAL HIGH (ref 70–99)
Potassium: 4.4 mmol/L (ref 3.5–5.1)
Sodium: 140 mmol/L (ref 135–145)

## 2023-10-11 LAB — TSH: TSH: 6.342 u[IU]/mL — ABNORMAL HIGH (ref 0.350–4.500)

## 2023-10-11 LAB — T4, FREE: Free T4: 0.74 ng/dL (ref 0.61–1.12)

## 2023-10-12 ENCOUNTER — Other Ambulatory Visit: Payer: Self-pay

## 2023-10-12 ENCOUNTER — Telehealth: Payer: Self-pay | Admitting: *Deleted

## 2023-10-12 NOTE — Telephone Encounter (Signed)
Left message with note below 

## 2023-10-12 NOTE — Patient Outreach (Signed)
Care Management  Transitions of Care Program Transitions of Care Post-discharge week 2   10/12/2023 Name: [name] [name] MRN: [id number] DOB: 1932-03-01  Subjective: [name] [name] is a 89 y.o. year old male who is a primary care patient of [name], MD. The Care Management team Engaged with patient Engaged with patient by telephone to assess and address transitions of care needs.   Consent to Services:  Patient was given information about care management services, agreed to services, and gave verbal consent to participate.   Assessment:    RNCM spoke with very pleasant gentleman for TOC 30 Day Program outreach, week 2 visit.  Patient reports he has been recovering at a good rate and has been able to resume short walks with his neighbors.  Patient denies decreased appetite, unusual fatigue/weakness or hematuria.  States it was discussed at his last appointment with Dr. Johny Drilling about resuming his immunotherapy treatments as long as his kidney functions continue to look good.  Patient denied any complaints or concerns and feels very supported by his adult children, neighbors and friends.  Patient able to verbalize signs, symptoms, concerns that his providers should be contacted for.  Agreeable to RN TOC FU visit 10/19/2023 at 12:30pm.        SDOH Interventions    Flowsheet Row Patient Outreach from 10/12/2023 in [place] [facility] Telephone from 10/05/2023 in [place] [facility] DEPARTMENT  SDOH Interventions    Food Insecurity Interventions -- Intervention Not Indicated  Housing Interventions -- Intervention Not Indicated  Transportation Interventions -- Intervention Not Indicated  Utilities Interventions -- Intervention Not Indicated  Financial Strain Interventions Intervention Not Indicated --  Physical Activity Interventions Intervention Not Indicated --  Social Connections Interventions Intervention Not Indicated --  Health  Literacy Interventions Intervention Not Indicated --        Goals Addressed               This Visit's Progress     Patient will report no readmissions to the hospital in the next 30 days. (pt-stated)        Current Barriers:  Provider appointments :  Patient has appointment scheduled with PCP office 10/25/2023 Recent bladder evacuation and hematuria, reports weakness - 10/12/23 Patient reports ongoing improvements in his strength and endurance, states "He is reaching his summit"  RNCM Clinical Goal(s):  Patient will work with the Care Management team over the next 30 days to address Transition of Care Barriers: Provider appointments Patient will be observant for increased blood in urine or change in condition take all medications exactly as prescribed and will call provider for medication related questions as evidenced by patient report attend all scheduled medical appointments: PCP and specialist as evidenced by patient report and review of EMR  through collaboration with RN Care manager, provider, and care team.   Interventions: Evaluation of current treatment plan related to  self management and patient's adherence to plan as established by provider  Reviewed medications/medication adherence:  Very good understanding of the purpose/ dosing/ scheduling of medications; reports 100% adherence in taking medications as instructed.  Transitions of Care:  Goal on track:  Yes. Doctor Visits  - discussed the importance of doctor visits Reviewed Signs and symptoms of infection Reviewed importance of be observant for changes in urinary flow, increased pain and bloody urine  Oncology:  (Status: Goal on track:  Yes.) Short Term Goal Assessment of understanding of oncology diagnosis:  Reviewed upcoming provider appointments and treatment appointments Assessed  available transportation to appointments and treatments. Has consistent/reliable transportation: Yes Assessed support system. Has  consistent/reliable family or other support: Yes  Patient Goals/Self-Care Activities: Participate in Transition of Care Program/Attend [facility] scheduled calls Notify RN Care Manager of TOC call rescheduling needs Take all medications as prescribed Attend all scheduled provider appointments Call provider office for new concerns or questions  Provided my contact information for patient to call me if needed prior to his next scheduled TOC call.   Follow Up Plan:  Telephone follow up appointment with care management team member scheduled for:  10/19/2023 at 12:30PM          Plan: Telephone follow up appointment with care management team member scheduled for: 10/19/2023 The patient has been provided with contact information for the care management team and has been advised to call with any health related questions or concerns.   Kyal Arts Daphine Deutscher BSN, RN RN Care Manager   Transitions of Care [facility] Direct Dial Number:  [phone]

## 2023-10-12 NOTE — Telephone Encounter (Signed)
-----   Message from [name] sent at 10/12/2023  8:24 AM EST ----- Les Longmore please let him know kidney function improved. If he is urinating well then that's fine. Continue oral fluid, goal 64+ oz per day. Labs as planned on Friday with me. Thanks

## 2023-10-14 ENCOUNTER — Other Ambulatory Visit: Payer: Self-pay

## 2023-10-14 DIAGNOSIS — N184 Chronic kidney disease, stage 4 (severe): Secondary | ICD-10-CM

## 2023-10-14 DIAGNOSIS — Z9189 Other specified personal risk factors, not elsewhere classified: Secondary | ICD-10-CM

## 2023-10-14 NOTE — Assessment & Plan Note (Addendum)
No improved on hydration. 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 [name] [name] has no signs of UTI.  Negative UA today. Will start prednisone at 60 mg daily.  Labs on 12/5 and follow-up with me Hold treatment Report to ED if unable to urinate, nausea, profound fatigue Increase fluid to 60-70 oz per day

## 2023-10-14 NOTE — Assessment & Plan Note (Signed)
Hold pembro Return with labs on 12/5 and re-evaluate

## 2023-10-14 NOTE — Progress Notes (Signed)
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  Recommend start prednisone 60 mg daily for 1 week, followed by 50 mg daily for 1 week, and decrease by 10 mg weekly.  He will see me during he is on 50 mg dosage and repeat labs before appointment.  AKI (acute kidney injury) (HCC) No improved on hydration. 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 [name] has no signs of UTI.  Negative UA today. Will start prednisone at 60 mg daily.  Labs on 12/5 and follow-up with me Hold treatment Report to ED if unable to urinate, nausea, profound fatigue Increase fluid to 60-70 oz per day  Malignant neoplasm of urinary bladder (HCC) Hold pembro Return with labs on 12/5 and re-evaluate  Normocytic anemia Low b12 and iron deficiency B12 1000 mcg daily Ferrous sulfate 325 mg daily  Hypothyroidism On levothyroxine 50 mcg Checking TSH and fT4    The patient understands the plans discussed today and is in agreement with them.  He knows to contact our office if he develops concerns prior to his next appointment.  [name], MD  [facility] - A DEPT OF [facility] [address] [place] [address] Dept: [phone] Dept Fax: [phone]   No orders of the defined types were placed in this encounter.     CHIEF COMPLAINT:  CC: UC  Current Treatment: Pembrolizumab  INTERVAL HISTORY:  Demontray is here today  for repeat clinical assessment. Report more frequency with swirl of blood in the urine separately. No burning pain. Report intermittent chronic lower back pain unchanged.  He denies fevers or chills. His appetite is good. The frequency increased as he drinks more at night. Appetite is good.  No rash, short of breath or cough.  I have reviewed the past medical history, past surgical history, social history and family history with the patient and they are unchanged from previous note.  ALLERGIES:  is allergic to macrobid [nitrofurantoin], ciprofloxacin, keytruda [pembrolizumab], tramadol hcl, cephalexin, and jelmyto [mitomycin].  MEDICATIONS:  Current Outpatient Medications  Medication Sig Dispense Refill   amLODipine (NORVASC) 10 MG tablet Take 1 tablet (10 mg total) by mouth daily. (Patient taking differently: Take 10 mg by mouth at bedtime.) 90 tablet 1   aspirin EC 81 MG tablet Take 81 mg by mouth every evening. Swallow whole.     ferrous sulfate 325 (65 FE) MG tablet Take 325 mg by mouth daily with supper.     finasteride (PROSCAR) 5 MG tablet TAKE ONE TABLET BY MOUTH DAILY (Patient taking differently: Take 5 mg by mouth at bedtime.) 90 tablet 3   levocetirizine (XYZAL) 5 MG tablet Take 5 mg by mouth daily as needed (for itching).     levothyroxine (SYNTHROID, LEVOTHROID) 50 MCG tablet Take 50 mcg by mouth daily before breakfast.     MYRBETRIQ 50 MG TB24 tablet Take 50 mg by mouth at bedtime.     pantoprazole (PROTONIX) 20 MG tablet Take 20 mg by mouth at  bedtime.     predniSONE (DELTASONE) 10 MG tablet Start at 60 mg today, then once a day in the morning starting tomorrow after breakfast for 1 week, followed by 50 mg daily for 1 week, and see me before further changes 140 tablet 0   psyllium (METAMUCIL) 58.6 % powder Take 0.5 packets by mouth every evening.     rosuvastatin (CRESTOR) 5 MG tablet Take 5 mg by mouth at bedtime.     senna-docusate (SENOKOT-S) 8.6-50 MG tablet Take 1 tablet by  mouth 2 (two) times daily. While taking strong pain meds to prevent constipation 10 tablet 0   silodosin (RAPAFLO) 8 MG CAPS capsule TAKE ONE CAPSULE BY MOUTH EVERY NIGHT AT BEDTIME 90 capsule 3   sodium bicarbonate 650 MG tablet Take 650 mg by mouth See admin instructions. Take 650 mg by mouth at noon and midnight     TYLENOL 8 HOUR ARTHRITIS PAIN 650 MG CR tablet Take 1,300 mg by mouth every 8 (eight) hours as needed for pain.     ondansetron (ZOFRAN) 8 MG tablet Take 1 tablet (8 mg total) by mouth every 8 (eight) hours as needed for nausea or vomiting. (Patient not taking: Reported on 10/15/2023) 30 tablet 1   oxyCODONE (ROXICODONE) 5 MG immediate release tablet Take 1 tablet (5 mg total) by mouth every 6 (six) hours as needed for moderate pain or severe pain (Post-operatively). (Patient not taking: Reported on 09/28/2023) 10 tablet 0   prochlorperazine (COMPAZINE) 10 MG tablet Take 1 tablet (10 mg total) by mouth every 6 (six) hours as needed for nausea or vomiting. (Patient not taking: Reported on 10/15/2023) 30 tablet 1   No current facility-administered medications for this visit.   Past Medical History:  Diagnosis Date   Anemia    Arthritis    lower back bulging disc, hips, knees, thumbs, shoulder   Asthma    as a pre teen   Bladder cancer (HCC) UROLOGIST-  DR MCKENZIE   S/P TURBT 08-16-2017   Bladder wall hemorrhage 09/28/2023   BPH (benign prostatic hyperplasia)    CKD (chronic kidney disease), stage III (HCC)    STAGE 3  B CKD per lov 10-16-2020 dr Thyra Breed Everlene Balls kidney on chart   Dyspnea    Fatigue    MIDDLE OF DAY ON OCCASION   Frequency of urination    GERD (gastroesophageal reflux disease)    on protonix   Hematuria    Hematuria    History of colon polyps    HLD (hyperlipidemia)    HOH (hard of hearing)    slightly hoh right worse thanleft   HTN (hypertension)    Hypothyroidism    Incontinence of urine    weras pads all the time   Pigmented basal cell  carcinoma (BCC) 03/26/2021   Right Buccal Cheek   Pre-diabetes    Recurrent bladder papillary carcinoma (HCC) hx 2012 and 2014-- urologist- dr Ronne Binning   s/p  TURBT 08-16-2017  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   Renal mass 09/02/2022   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 10/02/2023   Sleep apnea    uses cpap set on 20 /4   Trigger finger of left hand 07/04/2021   last few months per pt   UTI (urinary tract infection) 08/20/2020   Weakness of extremity    left knee   Wears glasses    Past Surgical History:  Procedure Laterality Date   BCG X 2  AFTER LAST 2 BLADDER TUMOR REMOVALS   CATARACT EXTRACTION, BILATERAL     COLONSCOPY  YRS AGO   CYSTOSCOPY N/A 10/29/2020   Procedure: CYSTOSCOPY;  Surgeon: Noel Christmas, MD;  Location: [facility];  Service: Urology;  Laterality: N/A;   CYSTOSCOPY N/A 07/08/2021   Procedure: CYSTOSCOPY;  Surgeon: Noel Christmas, MD;  Location: [facility];  Service: Urology;  Laterality: N/A;   CYSTOSCOPY W/ RETROGRADES Right 07/21/2023   Procedure: CYSTOSCOPY WITH RIGHT RETROGRADE PYELOGRAM;  Surgeon: Loletta Parish., MD;  Location: [facility];  Service: Urology;  Laterality: Right;   CYSTOSCOPY W/ URETERAL STENT PLACEMENT Bilateral 08/16/2017   Procedure: CYSTOSCOPY WITH RETROGRADE PYELOGRAM/URETERAL STENT PLACEMENT;  Surgeon: Malen Gauze, MD;  Location: [facility];  Service: Urology;  Laterality: Bilateral;   CYSTOSCOPY W/ URETERAL STENT PLACEMENT Left 09/02/2022   Procedure: CYSTOSCOPY WITH RETROGRADE PYELOGRAM, URETERAL STENT PLACEMENT;  Surgeon: Sebastian Ache, MD;  Location: [facility];  Service: Urology;  Laterality: Left;   CYSTOSCOPY WITH BIOPSY N/A 06/25/2020   Procedure: CYSTOSCOPY FULGURATION   BLADDER  BIOPSY;  Surgeon: Noel Christmas, MD;  Location: [facility];  Service: Urology;  Laterality: N/A;   CYSTOSCOPY WITH FULGERATION  N/A 12/08/2019   Procedure: CYSTOSCOPY WITH FULGERATION;  Surgeon: Malen Gauze, MD;  Location: [facility];  Service: Urology;  Laterality: N/A;   CYSTOSCOPY WITH FULGERATION N/A 03/24/2022   Procedure: CYSTOSCOPY WITH FULGERATION;  Surgeon: Noel Christmas, MD;  Location: [facility];  Service: Urology;  Laterality: N/A;   CYSTOSCOPY WITH RETROGRADE PYELOGRAM, URETEROSCOPY AND STENT PLACEMENT Left 03/24/2022   Procedure: CYSTOSCOPY WITH RETROGRADE PYELOGRAM, DIAGNOSTIC URETEROSCOPY WITH BIOPSY AND STENT PLACEMENT;  Surgeon: Noel Christmas, MD;  Location: [facility];  Service: Urology;  Laterality: Left;  90 MINUTES   CYSTOSCOPY WITH RETROGRADE PYELOGRAM, URETEROSCOPY AND STENT PLACEMENT Left 04/07/2022   Procedure: CYSTOSCOPY WITH  RETROGRADE PYELOGRAM, DIAGNOSTIC URETEROSCOPY WITH BIOPSY AND STENT EXCHANGE;  Surgeon: Noel Christmas, MD;  Location: [facility];  Service: Urology;  Laterality: Left;  75 MINUTES   CYSTOSCOPY WITH RETROGRADE PYELOGRAM, URETEROSCOPY AND STENT PLACEMENT Left 08/11/2022   Procedure: CYSTOSCOPY WITH RETROGRADE PYELOGRAM, ANTEGRADE NEPHROSTOGRAM, DIAGNOSTIC  URETEROSCOPY;  Surgeon: Noel Christmas, MD;  Location: [facility];  Service: Urology;  Laterality: Left;  90 MINS   HOLMIUM LASER APPLICATION Left 08/11/2022   Procedure: HOLMIUM LASER APPLICATION;  Surgeon: Noel Christmas, MD;  Location: [facility];  Service: Urology;  Laterality: Left;   IR NEPHROSTOMY EXCHANGE LEFT  05/15/2022   IR NEPHROSTOMY EXCHANGE LEFT  07/10/2022   IR NEPHROSTOMY PLACEMENT LEFT  05/06/2022   KNEE ARTHROSCOPY Left 06/10/2011   LYMPH NODE DISSECTION Left 09/02/2022   Procedure: RETROPERITONEAL LYMPH NODE DISSECTION;  Surgeon: Sebastian Ache, MD;  Location: [facility];  Service: Urology;  Laterality: Left;   PROSTATE BIOPSY  2000   ROBOT ASSITED LAPAROSCOPIC NEPHROURETERECTOMY Left 09/02/2022   Procedure: XI ROBOT ASSITED LAPAROSCOPIC NEPHROURETERECTOMY;  Surgeon: Sebastian Ache, MD;   Location: [facility];  Service: Urology;  Laterality: Left;   TRANSURETHRAL RESECTION OF BLADDER TUMOR N/A 08/16/2017   Procedure: TRANSURETHRAL RESECTION OF BLADDER TUMOR (TURBT);  Surgeon: Malen Gauze, MD;  Location[facility];  Service: Urology;  Laterality: N/A;   TRANSURETHRAL RESECTION OF BLADDER TUMOR  12-09-2012   dr Marcelyn Bruins [facility]   and TURP   TRANSURETHRAL RESECTION OF BLADDER TUMOR N/A 09/20/2017   Procedure: TRANSURETHRAL RESECTION  OF BLADDER TUMOR (TURBT);  Surgeon: Malen Gauze, MD;  Location: [facility];  Service: Urology;  Laterality: N/A;   TRANSURETHRAL RESECTION OF BLADDER TUMOR N/A 12/08/2019   Procedure: TRANSURETHRAL RESECTION OF BLADDER TUMOR;  Surgeon: Malen Gauze, MD;  Location: [facility];  Service: Urology;  Laterality: N/A;  1 HR   TRANSURETHRAL RESECTION OF BLADDER TUMOR N/A 10/29/2020   Procedure: BLADDER FULGERATION;  Surgeon: Noel Christmas, MD;  Location: [facility];  Service: Urology;  Laterality: N/A;  30 MINS   TRANSURETHRAL RESECTION OF BLADDER TUMOR N/A 07/08/2021   Procedure: TRANSURETHRAL RESECTION OF BLADDER TUMOR (TURBT)/FULGERATION;  Surgeon: Noel Christmas, MD;  Location: [facility];  Service: Urology;  Laterality: N/A;  45 MINS   TRANSURETHRAL RESECTION OF BLADDER TUMOR N/A 12/02/2021   Procedure: TRANSURETHRAL RESECTION OF BLADDER TUMOR (TURBT);  Surgeon: Noel Christmas, MD;  Location: [facility];  Service: Urology;  Laterality: N/A;   TRANSURETHRAL RESECTION OF BLADDER TUMOR N/A 07/21/2023   Procedure: TRANSURETHRAL RESECTION OF BLADDER TUMOR (TURBT);  Surgeon: Loletta Parish., MD;  Location: [facility];  Service: Urology;  Laterality: N/A;  60 MINUTES   TRANSURETHRAL RESECTION OF BLADDER TUMOR N/A 10/01/2023   Procedure: TRANSURETHRAL RESECTION OF BLADDER TUMOR (TURBT)/CLOT EVACUATION WITH FULGERATION;  Surgeon: Loletta Parish., MD;   Location: [facility];  Service: Urology;  Laterality: N/A;   TRANSURETHRAL RESECTION OF PROSTATE  2011   UPPER GI ENDOSCOPY  YRS AGO     HISTORY OF PRESENT ILLNESS:   Oncology History Overview Note  2000 BPH with elevated PSA. Report biopsy was benign.   2011 to now with multiple TURBT   2021 TaG3 bladder cancer. TURBT + BCG x 6  2023 TaG1 bladder + left renal pelvis cancer, failed [name] >>  08/2022 Left nephroureterectomy + RPLND. 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  06/2023 cysto w/ TURBT multifocal papillary bladder tumor recurrence (dome, left trigone, bladder neck, prostate urethral tissue) ==> TaG3 with negative muscle  Path: A. BLADDER TUMOR, TURBT:  Noninvasive high grade papillary urothelial carcinoma with inverted  growth pattern  muscularis propria (detrusor muscle) is present and not involved by  carcinoma   B. BLADDER TUMOR, NECK, BIOPSY:  Noninvasive high-grade papillary urothelial carcinoma with inverted  growth pattern  Submucosa, prostatic glands and muscularis is present and not involved  by carcinoma   C. BLADDER TUMOR, BASE, BIOPSY:  Noninvasive high grade papillary urothelial carcinoma  Muscularis propria is present and not involved by carcinoma       Malignant neoplasm of urinary bladder (HCC)  09/15/2019 Initial Diagnosis   Malignant neoplasm of urinary bladder (HCC)   08/05/2023 Cancer Staging   Staging form: Urinary Bladder, AJCC 8th Edition - Clinical: Stage 0a (rcTa, cN0, cM0) - Signed by [name], MD on 08/05/2023 Stage prefix: Recurrence WHO/ISUP grade (low/high):  High Grade Histologic grading system: 2 grade system   09/16/2023 -  Chemotherapy   Patient is on Treatment Plan : BLADDER Pembrolizumab (200) q21d     10/01/2023 Pathology Results   Presented with hematuria and underwent  TURBT  A. BLADDER TUMOR, TURBT:  Noninvasive high grade papillary urothelial carcinoma  Muscularis propria (detrusor muscle) is not present        REVIEW OF SYSTEMS:   All relevant systems were reviewed with the patient and are negative.   VITALS:  Blood pressure 130/66, pulse 73, temperature 97.7 F (36.5 C), temperature source Temporal, resp. rate 16, height 5\' 8"  (1.727 m), weight 165 lb 9.6 oz (75.1 kg), SpO2 100%.  Wt Readings from Last 3 Encounters:  10/15/23 165 lb 9.6 oz (75.1 kg)  10/08/23 164 lb 12.8 oz (74.8 kg)  09/28/23 165 lb 5.5 oz (75 kg)    Body mass index is 25.18 kg/m.  Performance status (ECOG): 2 - Symptomatic, <50% confined to bed  PHYSICAL EXAM:   GENERAL:alert, no distress and comfortable SKIN: skin color normal, no rashes  EYES: normal, sclera clear OROPHARYNX: no exudate, no erythema    NECK: supple,  non-tender, without nodularity LYMPH:  no palpable cervical lymphadenopathy LUNGS: clear to auscultation with normal breathing effort.  No wheeze or rales HEART: regular rate & rhythm and no murmurs and no lower extremity edema ABDOMEN: abdomen soft, non-tender and nondistended Musculoskeletal: no edema NEURO: alert, fluent speech  LABORATORY DATA:  I have reviewed the data as listed    Component Value Date/Time   NA 140 10/15/2023 0809   K 4.1 10/15/2023 0809   CL 109 10/15/2023 0809   CO2 21 (L) 10/15/2023 0809   GLUCOSE 93 10/15/2023 0809   BUN 59 (H) 10/15/2023 0809   CREATININE 3.90 (H) 10/15/2023 0809   CALCIUM 9.2 10/15/2023 0809   PROT 7.1 10/15/2023 0809   ALBUMIN 4.0 10/15/2023 0809   AST 14 (L) 10/15/2023 0809   ALT 11 10/15/2023 0809   ALKPHOS 68 10/15/2023 0809   BILITOT 0.5 10/15/2023 0809    GFRNONAA 14 (L) 10/15/2023 0809   GFRAA 35 (L) 08/23/2020 0524    No results found for: "SPEP", "UPEP"  Lab Results  Component Value Date   WBC 8.4 10/15/2023   NEUTROABS 6.7 10/15/2023   HGB 9.5 (L) 10/15/2023   HCT 28.4 (L) 10/15/2023   MCV 89.9 10/15/2023   PLT 280 10/15/2023      Chemistry      Component Value Date/Time   NA 140 10/15/2023 0809   K 4.1 10/15/2023 0809   CL 109 10/15/2023 0809   CO2 21 (L) 10/15/2023 0809   BUN 59 (H) 10/15/2023 0809   CREATININE 3.90 (H) 10/15/2023 0809      Component Value Date/Time   CALCIUM 9.2 10/15/2023 0809   ALKPHOS 68 10/15/2023 0809   AST 14 (L) 10/15/2023 0809   ALT 11 10/15/2023 0809   BILITOT 0.5 10/15/2023 0809       RADIOGRAPHIC STUDIES: I have personally reviewed the radiological images as listed and agreed with the findings in the report. DG C-Arm 1-60 Min-No Report  Result Date: 10/01/2023 Fluoroscopy was utilized by the requesting physician.  No radiographic interpretation.   CT Renal Stone Study  Result Date: 09/28/2023 CLINICAL DATA:  Hematuria. 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 EXAM: CT ABDOMEN AND PELVIS WITHOUT CONTRAST TECHNIQUE: Multidetector CT imaging of the abdomen and pelvis was performed following the standard protocol without IV contrast. RADIATION DOSE REDUCTION: This exam was performed according to the departmental dose-optimization program which includes automated exposure control, adjustment of the mA and/or kV according to patient size and/or use of iterative reconstruction technique. COMPARISON:  CT abdomen/pelvis dated March 17, 2023. FINDINGS: Lower chest: No acute abnormality. Hepatobiliary: No suspicious focal liver lesion, within the limits of a noncontrast exam. Probable small calcified gallstone. No gallbladder wall thickening. No biliary dilatation. Pancreas: Unremarkable. No pancreatic ductal dilatation or surrounding inflammatory changes. Spleen: Normal in size without  focal abnormality. Adrenals/Urinary Tract: The adrenal glands are unremarkable. Status post left nephrectomy. No suspicious focal soft tissue or collection to suggest recurrence. Normal noncontrast appearance of the right kidney. No suspicious focal lesion, within the limits of an noncontrast exam. Punctate nonobstructive 2 mm calculus in the inferior pole of the right kidney. No hydronephrosis. There is mild haziness and bladder wall thickening at the superior bladder dome. Stomach/Bowel: Stomach is within normal limits. Appendix appears normal. No evidence of obstruction. Descending and sigmoid colonic diverticulosis without evidence of acute diverticulitis. Vascular/Lymphatic: Aortic atherosclerosis. Similar prominent left periaortic nodes, not definitively enlarged by size criteria. Reproductive: Prostatomegaly.  Evidence of prior TURP. Other: Small fat containing umbilical hernia. No abdominopelvic ascites. Musculoskeletal: No acute osseous abnormality. Severe degenerative changes of the right hip resulting in bone-on-bone contact, subchondral sclerosis and prominent subchondral cystic changes. Moderate osteoarthritis of the left hip. Multilevel degenerative changes of the spine. IMPRESSION: 1. Status post left nephrectomy. No evidence of recurrence identified within the limits of a noncontrast examination. 2. Punctate nonobstructive 2 mm stone in the inferior pole of the right kidney. No hydronephrosis. 3. Similar prominent left periaortic nodes, not definitively enlarged by size criteria, are nonspecific. 4. Descending and sigmoid colonic diverticulosis without evidence of acute diverticulitis. Electronically Signed   By: Hart Robinsons M.D.   On: 09/28/2023 15:45

## 2023-10-14 NOTE — Assessment & Plan Note (Signed)
Low b12 and iron deficiency B12 1000 mcg daily Ferrous sulfate 325 mg daily

## 2023-10-15 ENCOUNTER — Telehealth: Payer: Self-pay

## 2023-10-15 ENCOUNTER — Inpatient Hospital Stay: Payer: Medicare Other

## 2023-10-15 ENCOUNTER — Other Ambulatory Visit: Payer: Self-pay

## 2023-10-15 ENCOUNTER — Inpatient Hospital Stay (HOSPITAL_BASED_OUTPATIENT_CLINIC_OR_DEPARTMENT_OTHER): Payer: Medicare Other

## 2023-10-15 ENCOUNTER — Ambulatory Visit: Payer: Medicare Other

## 2023-10-15 VITALS — BP 130/66 | HR 73 | Temp 97.7°F | Resp 16 | Ht 68.0 in | Wt 165.6 lb

## 2023-10-15 DIAGNOSIS — D649 Anemia, unspecified: Secondary | ICD-10-CM | POA: Diagnosis not present

## 2023-10-15 DIAGNOSIS — R300 Dysuria: Secondary | ICD-10-CM | POA: Diagnosis not present

## 2023-10-15 DIAGNOSIS — C662 Malignant neoplasm of left ureter: Secondary | ICD-10-CM | POA: Diagnosis not present

## 2023-10-15 DIAGNOSIS — D509 Iron deficiency anemia, unspecified: Secondary | ICD-10-CM | POA: Diagnosis not present

## 2023-10-15 DIAGNOSIS — Z87440 Personal history of urinary (tract) infections: Secondary | ICD-10-CM | POA: Diagnosis not present

## 2023-10-15 DIAGNOSIS — Z9189 Other specified personal risk factors, not elsewhere classified: Secondary | ICD-10-CM | POA: Diagnosis not present

## 2023-10-15 DIAGNOSIS — E039 Hypothyroidism, unspecified: Secondary | ICD-10-CM

## 2023-10-15 DIAGNOSIS — C678 Malignant neoplasm of overlapping sites of bladder: Secondary | ICD-10-CM | POA: Diagnosis not present

## 2023-10-15 DIAGNOSIS — N179 Acute kidney failure, unspecified: Secondary | ICD-10-CM | POA: Diagnosis not present

## 2023-10-15 DIAGNOSIS — R310 Gross hematuria: Secondary | ICD-10-CM | POA: Diagnosis not present

## 2023-10-15 DIAGNOSIS — Z905 Acquired absence of kidney: Secondary | ICD-10-CM | POA: Diagnosis not present

## 2023-10-15 DIAGNOSIS — E538 Deficiency of other specified B group vitamins: Secondary | ICD-10-CM | POA: Diagnosis not present

## 2023-10-15 LAB — T4, FREE: Free T4: 0.9 ng/dL (ref 0.61–1.12)

## 2023-10-15 LAB — CBC WITH DIFFERENTIAL (CANCER CENTER ONLY)
Abs Immature Granulocytes: 0.03 10*3/uL (ref 0.00–0.07)
Basophils Absolute: 0 10*3/uL (ref 0.0–0.1)
Basophils Relative: 1 %
Eosinophils Absolute: 0.4 10*3/uL (ref 0.0–0.5)
Eosinophils Relative: 4 %
HCT: 28.4 % — ABNORMAL LOW (ref 39.0–52.0)
Hemoglobin: 9.5 g/dL — ABNORMAL LOW (ref 13.0–17.0)
Immature Granulocytes: 0 %
Lymphocytes Relative: 7 %
Lymphs Abs: 0.6 10*3/uL — ABNORMAL LOW (ref 0.7–4.0)
MCH: 30.1 pg (ref 26.0–34.0)
MCHC: 33.5 g/dL (ref 30.0–36.0)
MCV: 89.9 fL (ref 80.0–100.0)
Monocytes Absolute: 0.7 10*3/uL (ref 0.1–1.0)
Monocytes Relative: 8 %
Neutro Abs: 6.7 10*3/uL (ref 1.7–7.7)
Neutrophils Relative %: 80 %
Platelet Count: 280 10*3/uL (ref 150–400)
RBC: 3.16 MIL/uL — ABNORMAL LOW (ref 4.22–5.81)
RDW: 14.2 % (ref 11.5–15.5)
WBC Count: 8.4 10*3/uL (ref 4.0–10.5)
nRBC: 0 % (ref 0.0–0.2)

## 2023-10-15 LAB — CMP (CANCER CENTER ONLY)
ALT: 11 U/L (ref 0–44)
AST: 14 U/L — ABNORMAL LOW (ref 15–41)
Albumin: 4 g/dL (ref 3.5–5.0)
Alkaline Phosphatase: 68 U/L (ref 38–126)
Anion gap: 10 (ref 5–15)
BUN: 59 mg/dL — ABNORMAL HIGH (ref 8–23)
CO2: 21 mmol/L — ABNORMAL LOW (ref 22–32)
Calcium: 9.2 mg/dL (ref 8.9–10.3)
Chloride: 109 mmol/L (ref 98–111)
Creatinine: 3.9 mg/dL — ABNORMAL HIGH (ref 0.61–1.24)
GFR, Estimated: 14 mL/min — ABNORMAL LOW (ref >60)
Glucose, Bld: 93 mg/dL (ref 70–99)
Potassium: 4.1 mmol/L (ref 3.5–5.1)
Sodium: 140 mmol/L (ref 135–145)
Total Bilirubin: 0.5 mg/dL (ref <1.2)
Total Protein: 7.1 g/dL (ref 6.5–8.1)

## 2023-10-15 LAB — URINALYSIS, COMPLETE (UACMP) WITH MICROSCOPIC
Bilirubin Urine: NEGATIVE
Glucose, UA: NEGATIVE mg/dL
Ketones, ur: NEGATIVE mg/dL
Nitrite: NEGATIVE
Protein, ur: 100 mg/dL — AB
RBC / HPF: >50 RBC/hpf (ref 0–5)
Specific Gravity, Urine: 1.01 (ref 1.005–1.030)
WBC, UA: >50 WBC/hpf (ref 0–5)
pH: 6 (ref 5.0–8.0)

## 2023-10-15 LAB — TSH: TSH: 6.918 u[IU]/mL — ABNORMAL HIGH (ref 0.350–4.500)

## 2023-10-15 MED ORDER — PREDNISONE 10 MG PO TABS: ORAL_TABLET | ORAL | 0 refills | Status: DC

## 2023-10-15 NOTE — Patient Outreach (Signed)
Renal coordinator attempted to call patient on today regarding Safe Start referral. Successful outreach made today with patient. Safe Start telephone assessment completed today. Coordinator will send referral for RN Care Coordination for continued care.   Will continue renal outreach per patient in 3 months.  Baruch Gouty Renal Coordinator [facility] [phone]

## 2023-10-15 NOTE — Patient Instructions (Addendum)
You kidney function has not improved despite good fluid intake. You have good urine amount so that is good. Let's check your urine study today. If no infection, will start  prednisone at 70 mg daily for 1 weeks, then 60 mg daily for one week and follow up with me.  Hold on [place] for now.  If urine shows infection, will send your cipro antibiotics twice daily for 7 days instead.  Schedule appointment to see me on 12/2 at 10 with repeat labs before visit.

## 2023-10-15 NOTE — Assessment & Plan Note (Signed)
On levothyroxine 50 mcg Checking TSH and fT4

## 2023-10-19 ENCOUNTER — Other Ambulatory Visit: Payer: Self-pay

## 2023-10-19 NOTE — Assessment & Plan Note (Addendum)
stop pembro and plan for surgery Return with labs on 12/10 and re-evaluate

## 2023-10-19 NOTE — Assessment & Plan Note (Addendum)
No improved on hydration. 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 [name] [name] has no signs of UTI.  Negative UA. Improvement after started prednisone at 60 mg daily.  Labs today showed Cr of  Decrease prednisone to 40 mg daily starting on Friday. Repeat labs next Tue and see me Hold treatment Report to ED if unable to urinate, nausea, profound fatigue Increase fluid to 60-70 oz per day

## 2023-10-20 ENCOUNTER — Other Ambulatory Visit: Payer: Self-pay

## 2023-10-20 NOTE — Patient Outreach (Signed)
Care Management  Transitions of Care Program Transitions of Care Post-discharge week 3   10/20/2023 Name: [name] [name] MRN: [id number] DOB: 01-Nov-1932  Subjective: [name] [name] is a 89 y.o. year old male who is a primary care patient of [name] Fick, MD. The Care Management team engaged with patient by telephone to assess and address transitions of care needs.   Consent to Services:  Patient was given information about care management services, agreed to services, and gave verbal consent to participate.   Assessment:   Patient reports that he progressing and recovering well at home.  He is very engaged and active in his treatment plan and expresses importance of adhering to his treatment team orders, medications and follow-appointments.  His son and wife are currently residing with him and are also very engaged and supportive during his recovery.  Patient reports that his [name] treatments are currently on hold until his kidney function test improve.  Recent medication changes include:  Weekly Intravenous Saline Infusions at the [facility] possible on that weeks kidney function lab work, as well as, starting on an oral Prednisone taper regime.  Patient verbalizes excellent understanding and adherence with these changes.  His next Oncology and lab work appointments are scheduled for 10/25/2023 and 10/28/2023.  Additionally, he is being followed outpatient by Nephrology, Urology and [facility] Renal Coordinator/Health Coach.       SDOH Interventions    Flowsheet Row Patient Outreach from 10/12/2023 in [place] [facility] Telephone from 10/05/2023 in [place] [facility] DEPARTMENT  SDOH Interventions    Food Insecurity Interventions -- Intervention Not Indicated  Housing Interventions -- Intervention Not Indicated  Transportation Interventions -- Intervention Not Indicated  Utilities Interventions -- Intervention Not Indicated   Financial Strain Interventions Intervention Not Indicated --  Physical Activity Interventions Intervention Not Indicated --  Social Connections Interventions Intervention Not Indicated --  Health Literacy Interventions Intervention Not Indicated --        Goals Addressed               This Visit's Progress     Current Barriers:  Chronic Disease Management support and education needs related to Chronic Kidney Disease and Bladder Cancer  RNCM Clinical Goal(s):  Patient will work with the Care Management team over the next 30 days to address Transition of Care Barriers: Provider appointments Patient will be observant for urinary changes and/or any other changes in medical condition from his normal baseline and promptly notify his physician take all medications exactly as prescribed and will call provider for medication related questions as evidenced by patient report attend all scheduled medical appointments: PCP and specialist as evidenced by patient report and review of EMR through collaboration with RN Care manager, provider, and care team.  Patient will follow recommended renal diet   Interventions: Evaluation of current treatment plan related to  self management and patient's adherence to plan as established by provider. Patient education on: CKD, treatment and self-management techniques.  Patient verbalized good understanding. Reviewed Provider appointments :  Patient has appointment scheduled with Oncology and repeat lab work on 10/25/2023 Reviewed medications/medication adherence:  Reviewed recent medication changes: Prednisone taper, Hold Kertuda and weekly intravenous saline boluses to treatment decreased renal function.  Very good understanding of the purpose, scheduling of medications; reports 100% adherence in taking medications as instructed.  Transitions of Care:  Goal on track:  Yes. Education on Importance of Follow- Up Labs to successfully manage kidney  function Upcoming  Specialist Doctor Visits  - discussed the importance of doctor visits; reports 100% adherence Reviewed ongoing importance of observing for changes in urinary output, pain with urination or bloody urine and to prompt notify his treating physician.  Oncology:   Goal on track:  Yes. Assessment of understanding of oncology diagnosis: Bladder Cancer Assessed patient understanding of cancer diagnosis and recommended treatment plan.  [name] [name] is currently on hold while they address his decreased kidney function.  Patient also discussed recent discussion with his Oncology team for possible bladder removal.  Patient is being followed closely outpatient by his Oncology team for resumption of cancer treatments once his kidney function test improve.    Patient Goals/Self-Care Activities: Participate in Transition of Care Program/Attend [facility] scheduled calls Notify RN Care Manager of TOC call rescheduling needs Take all medications as prescribed Attend all scheduled provider appointments Call provider office for new concerns or questions  Provided my contact information for patient to call me if needed prior to his next scheduled TOC call.   Follow Up Plan:  Telephone follow up appointment with care management team member scheduled for:  10/29/2023 at 11 AM.          Plan: Telephone follow up appointment with care management team member scheduled for: 10/29/2023 Confirmed patient has my direct contact information should questions/ concerns arise prior to our next call.  [name] [name] BSN, RN RN Care Manager   Transitions of Care [facility] Direct Dial Number:  [phone]

## 2023-10-21 ENCOUNTER — Other Ambulatory Visit: Payer: Self-pay

## 2023-10-24 NOTE — Progress Notes (Unsigned)
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   Repeat UA without signs of infectious process.  AKI on CKD improved on prednisone 60 mg daily for 1 week now on 50 mg daily.  Overall feeling better.  Malignant neoplasm of urinary bladder (HCC) stop pembro and plan for surgery Return with labs on 12/10 and re-evaluate  AKI (acute kidney injury) (HCC) No improved on hydration. 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 [name] has no signs of UTI.  Negative UA. Improvement after started prednisone at 60 mg daily.  Labs today showed Cr of  Decrease prednisone to 40 mg daily starting on Friday. Repeat labs next Tue and see me Hold treatment Report to ED if unable to urinate, nausea, profound fatigue Increase fluid to 60-70 oz per day  Hypothyroidism On levothyroxine 50 mcg Checking TSH and fT4   See me on 12/10 after labs. The patient understands the plans discussed today and is in agreement with them.  He knows to contact our office if he develops concerns prior to his next appointment.  [name], MD  [facility] - A DEPT OF [facility] [address] [place] [address] Dept: [phone] Dept Fax: [phone]   No orders of the defined types were placed in this encounter.     CHIEF COMPLAINT:  CC: UC  Current Treatment:  on hold  INTERVAL HISTORY:  Heith is here today for repeat clinical assessment. He denies significant changes.   Overall feeling better.  Energy is better.  No difficulty with urination.  Drinking a lot of fluid.  No hematuria, dysuria.  I have reviewed the past medical history, past surgical history, social history and family history with the patient and they are unchanged from previous note.  ALLERGIES:  is allergic to macrobid [nitrofurantoin], ciprofloxacin, keytruda [pembrolizumab], tramadol hcl, cephalexin, and jelmyto [mitomycin].  MEDICATIONS:  Current Outpatient Medications  Medication Sig Dispense Refill   amLODipine (NORVASC) 10 MG tablet Take 1 tablet (10 mg total) by mouth daily. (Patient taking differently: Take 10 mg by mouth at bedtime.) 90 tablet 1   ferrous sulfate 325 (65 FE) MG tablet Take 325 mg by mouth daily with supper.     finasteride (PROSCAR) 5 MG tablet TAKE ONE TABLET BY MOUTH DAILY (Patient taking differently: Take 5 mg by mouth at bedtime.) 90 tablet 3   levothyroxine (SYNTHROID, LEVOTHROID) 50 MCG tablet Take 50 mcg by mouth daily before breakfast.     MYRBETRIQ 50 MG TB24 tablet Take 50 mg by mouth at bedtime.     pantoprazole (PROTONIX) 20 MG tablet Take 20 mg by mouth at bedtime.     predniSONE (DELTASONE) 10 MG tablet Start at 60 mg today, then once a day in the morning starting tomorrow after breakfast for 1 week, followed by 50 mg daily for 1 week, and see me before further changes 140 tablet 0   rosuvastatin (CRESTOR) 5 MG tablet Take 5 mg by mouth at bedtime.     silodosin (RAPAFLO) 8 MG CAPS capsule TAKE ONE  CAPSULE BY MOUTH EVERY NIGHT AT BEDTIME 90 capsule 3   sodium bicarbonate 650 MG tablet Take 650 mg by mouth See admin instructions. Take 650 mg by mouth at noon and midnight     aspirin EC 81 MG tablet Take 81 mg by mouth every evening. Swallow whole. (Patient not taking: Reported on 10/25/2023)     psyllium (METAMUCIL) 58.6 % powder Take 0.5 packets by mouth every evening. (Patient not taking: Reported on 10/25/2023)     senna-docusate (SENOKOT-S) 8.6-50 MG  tablet Take 1 tablet by mouth 2 (two) times daily. While taking strong pain meds to prevent constipation (Patient not taking: Reported on 10/25/2023) 10 tablet 0   TYLENOL 8 HOUR ARTHRITIS PAIN 650 MG CR tablet Take 1,300 mg by mouth every 8 (eight) hours as needed for pain. (Patient not taking: Reported on 10/25/2023)     No current facility-administered medications for this visit.    HISTORY OF PRESENT ILLNESS:   Oncology History Overview Note  2000 BPH with elevated PSA. Report biopsy was benign.   2011 to now with multiple TURBT   2021 TaG3 bladder cancer. TURBT + BCG x 6  2023 TaG1 bladder + left renal pelvis cancer, failed [name] >>  08/2022 Left nephroureterectomy + RPLND. 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  06/2023 cysto w/ TURBT multifocal papillary bladder tumor recurrence (dome, left trigone, bladder neck, prostate urethral tissue) ==> TaG3 with negative muscle  Path: A. BLADDER TUMOR, TURBT:  Noninvasive high grade papillary urothelial carcinoma with inverted  growth pattern  muscularis propria (detrusor muscle) is present and not involved by  carcinoma   B. BLADDER TUMOR, NECK, BIOPSY:  Noninvasive high-grade papillary urothelial carcinoma with inverted  growth pattern  Submucosa, prostatic glands and muscularis is present and not involved  by carcinoma   C. BLADDER TUMOR, BASE, BIOPSY:  Noninvasive high grade papillary urothelial carcinoma  Muscularis propria is present and not involved by carcinoma       Malignant neoplasm of urinary bladder (HCC)  09/15/2019 Initial Diagnosis   Malignant  neoplasm of urinary bladder (HCC)   08/05/2023 Cancer Staging   Staging form: Urinary Bladder, AJCC 8th Edition - Clinical: Stage 0a (rcTa, cN0, cM0) - Signed by [name], MD on 08/05/2023 Stage prefix: Recurrence WHO/ISUP grade (low/high): High Grade Histologic grading system: 2 grade system   09/16/2023 - 09/16/2023 Chemotherapy   Patient is on Treatment Plan : BLADDER Pembrolizumab (200) q21d     10/01/2023 Pathology Results   Presented with hematuria and underwent TURBT  A. BLADDER TUMOR, TURBT:  Noninvasive high grade papillary urothelial carcinoma  Muscularis propria (detrusor muscle) is not present        REVIEW OF SYSTEMS:   All relevant systems were reviewed with the patient and are negative.   VITALS:  Blood pressure 138/75, pulse 71, temperature (!) 97.5 F (36.4 C), temperature source Temporal, resp. rate 14, weight 173 lb 12.8 oz (78.8 kg), SpO2 100%.  Wt Readings from Last 3 Encounters:  10/25/23 173 lb 12.8 oz (78.8 kg)  10/15/23 165 lb 9.6 oz (75.1 kg)  10/08/23 164 lb 12.8 oz (74.8 kg)  Body mass index is 26.43 kg/m.  Performance status (ECOG): 1 - Symptomatic but completely ambulatory  PHYSICAL EXAM:   GENERAL:alert, no distress and comfortable SKIN: skin color normal, no rashes  EYES: normal, sclera clear LYMPH:  no palpable cervical lymphadenopathy LUNGS: clear to auscultation with normal breathing effort.  No wheeze or rales HEART: regular rate & rhythm and no murmurs and no lower extremity edema  LABORATORY DATA:  I have reviewed the data as listed    Component Value Date/Time   NA 140 10/25/2023 0935   K 4.4 10/25/2023 0935   CL 109 10/25/2023 0935   CO2 20 (L) 10/25/2023 0935   GLUCOSE 85 10/25/2023 0935   BUN 80 (H) 10/25/2023 0935   CREATININE 3.04 (H) 10/25/2023 0935   CALCIUM 8.3 (L) 10/25/2023 0935   PROT 6.7 10/25/2023 0935   ALBUMIN 4.0 10/25/2023 0935   AST 18 10/25/2023 0935   ALT 29 10/25/2023 0935   ALKPHOS 55  10/25/2023 0935   BILITOT 0.6 10/25/2023 0935   GFRNONAA 19 (L) 10/25/2023 0935   GFRAA 35 (L) 08/23/2020 0524    No results found for: "SPEP", "UPEP"  Lab Results  Component Value Date   WBC 8.4 10/15/2023   NEUTROABS 6.7 10/15/2023   HGB 9.5 (L) 10/15/2023   HCT 28.4 (L) 10/15/2023   MCV 89.9 10/15/2023   PLT 280 10/15/2023      Chemistry      Component Value Date/Time   NA 140 10/25/2023 0935   K 4.4 10/25/2023 0935   CL 109 10/25/2023 0935   CO2 20 (L) 10/25/2023 0935   BUN 80 (H) 10/25/2023 0935   CREATININE 3.04 (H) 10/25/2023 0935      Component Value Date/Time   CALCIUM 8.3 (L) 10/25/2023 0935   ALKPHOS 55 10/25/2023 0935   AST 18 10/25/2023 0935   ALT 29 10/25/2023 0935   BILITOT 0.6 10/25/2023 0935       RADIOGRAPHIC STUDIES: I have personally reviewed the radiological images as listed and agreed with the findings in the report. DG C-Arm 1-60 Min-No Report  Result Date: 10/01/2023 Fluoroscopy was utilized by the requesting physician.  No radiographic interpretation.   CT Renal Stone Study  Result Date: 09/28/2023 CLINICAL DATA:  Hematuria. 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 EXAM: CT ABDOMEN AND PELVIS WITHOUT CONTRAST TECHNIQUE: Multidetector CT imaging of the abdomen and pelvis was performed following the standard protocol without IV contrast. RADIATION DOSE REDUCTION: This exam was performed according to the departmental dose-optimization program which includes automated exposure control, adjustment of the mA and/or kV according to patient size and/or use of iterative reconstruction technique. COMPARISON:  CT abdomen/pelvis dated March 17, 2023. FINDINGS: Lower chest: No acute abnormality. Hepatobiliary: No suspicious focal liver lesion, within the limits of a noncontrast exam. Probable small calcified gallstone. No gallbladder wall thickening. No biliary dilatation. Pancreas: Unremarkable. No pancreatic ductal dilatation or surrounding  inflammatory changes. Spleen: Normal in size without focal abnormality. Adrenals/Urinary Tract: The adrenal glands are unremarkable. Status post left nephrectomy. No suspicious focal soft tissue or collection to suggest recurrence. Normal noncontrast appearance of the right kidney. No suspicious focal lesion, within the limits of an noncontrast exam. Punctate nonobstructive 2 mm calculus in the inferior pole of the right kidney. No hydronephrosis. There is mild haziness and bladder wall thickening at the superior bladder dome. Stomach/Bowel: Stomach is within normal limits. Appendix appears normal. No evidence of obstruction. Descending and sigmoid colonic diverticulosis without evidence of acute diverticulitis.  Vascular/Lymphatic: Aortic atherosclerosis. Similar prominent left periaortic nodes, not definitively enlarged by size criteria. Reproductive: Prostatomegaly.  Evidence of prior TURP. Other: Small fat containing umbilical hernia. No abdominopelvic ascites. Musculoskeletal: No acute osseous abnormality. Severe degenerative changes of the right hip resulting in bone-on-bone contact, subchondral sclerosis and prominent subchondral cystic changes. Moderate osteoarthritis of the left hip. Multilevel degenerative changes of the spine. IMPRESSION: 1. Status post left nephrectomy. No evidence of recurrence identified within the limits of a noncontrast examination. 2. Punctate nonobstructive 2 mm stone in the inferior pole of the right kidney. No hydronephrosis. 3. Similar prominent left periaortic nodes, not definitively enlarged by size criteria, are nonspecific. 4. Descending and sigmoid colonic diverticulosis without evidence of acute diverticulitis. Electronically Signed   By: Hart Robinsons M.D.   On: 09/28/2023 15:45

## 2023-10-25 ENCOUNTER — Inpatient Hospital Stay: Payer: Medicare Other

## 2023-10-25 ENCOUNTER — Other Ambulatory Visit: Payer: Self-pay | Admitting: *Deleted

## 2023-10-25 VITALS — BP 138/75 | HR 71 | Temp 97.5°F | Resp 14 | Wt 173.8 lb

## 2023-10-25 DIAGNOSIS — C678 Malignant neoplasm of overlapping sites of bladder: Secondary | ICD-10-CM | POA: Diagnosis not present

## 2023-10-25 DIAGNOSIS — Z9189 Other specified personal risk factors, not elsewhere classified: Secondary | ICD-10-CM

## 2023-10-25 DIAGNOSIS — N179 Acute kidney failure, unspecified: Secondary | ICD-10-CM | POA: Diagnosis not present

## 2023-10-25 DIAGNOSIS — Z79890 Hormone replacement therapy (postmenopausal): Secondary | ICD-10-CM | POA: Diagnosis not present

## 2023-10-25 DIAGNOSIS — N189 Chronic kidney disease, unspecified: Secondary | ICD-10-CM | POA: Diagnosis not present

## 2023-10-25 DIAGNOSIS — E611 Iron deficiency: Secondary | ICD-10-CM | POA: Diagnosis not present

## 2023-10-25 DIAGNOSIS — E538 Deficiency of other specified B group vitamins: Secondary | ICD-10-CM | POA: Diagnosis not present

## 2023-10-25 DIAGNOSIS — E039 Hypothyroidism, unspecified: Secondary | ICD-10-CM | POA: Insufficient documentation

## 2023-10-25 DIAGNOSIS — Z7952 Long term (current) use of systemic steroids: Secondary | ICD-10-CM | POA: Diagnosis not present

## 2023-10-25 DIAGNOSIS — D649 Anemia, unspecified: Secondary | ICD-10-CM | POA: Diagnosis not present

## 2023-10-25 DIAGNOSIS — Z09 Encounter for follow-up examination after completed treatment for conditions other than malignant neoplasm: Secondary | ICD-10-CM | POA: Diagnosis not present

## 2023-10-25 DIAGNOSIS — C679 Malignant neoplasm of bladder, unspecified: Secondary | ICD-10-CM | POA: Diagnosis not present

## 2023-10-25 LAB — CMP (CANCER CENTER ONLY)
ALT: 29 U/L (ref 0–44)
AST: 18 U/L (ref 15–41)
Albumin: 4 g/dL (ref 3.5–5.0)
Alkaline Phosphatase: 55 U/L (ref 38–126)
Anion gap: 11 (ref 5–15)
BUN: 80 mg/dL — ABNORMAL HIGH (ref 8–23)
CO2: 20 mmol/L — ABNORMAL LOW (ref 22–32)
Calcium: 8.3 mg/dL — ABNORMAL LOW (ref 8.9–10.3)
Chloride: 109 mmol/L (ref 98–111)
Creatinine: 3.04 mg/dL — ABNORMAL HIGH (ref 0.61–1.24)
GFR, Estimated: 19 mL/min — ABNORMAL LOW (ref >60)
Glucose, Bld: 85 mg/dL (ref 70–99)
Potassium: 4.4 mmol/L (ref 3.5–5.1)
Sodium: 140 mmol/L (ref 135–145)
Total Bilirubin: 0.6 mg/dL (ref <1.2)
Total Protein: 6.7 g/dL (ref 6.5–8.1)

## 2023-10-25 LAB — CBC WITH DIFFERENTIAL (CANCER CENTER ONLY)
Abs Immature Granulocytes: 0.5 10*3/uL — ABNORMAL HIGH (ref 0.00–0.07)
Basophils Absolute: 0 10*3/uL (ref 0.0–0.1)
Basophils Relative: 0 %
Eosinophils Absolute: 0 10*3/uL (ref 0.0–0.5)
Eosinophils Relative: 0 %
HCT: 30.6 % — ABNORMAL LOW (ref 39.0–52.0)
Hemoglobin: 10.1 g/dL — ABNORMAL LOW (ref 13.0–17.0)
Immature Granulocytes: 4 %
Lymphocytes Relative: 2 %
Lymphs Abs: 0.3 10*3/uL — ABNORMAL LOW (ref 0.7–4.0)
MCH: 29.9 pg (ref 26.0–34.0)
MCHC: 33 g/dL (ref 30.0–36.0)
MCV: 90.5 fL (ref 80.0–100.0)
Monocytes Absolute: 0.8 10*3/uL (ref 0.1–1.0)
Monocytes Relative: 6 %
Neutro Abs: 12.5 10*3/uL — ABNORMAL HIGH (ref 1.7–7.7)
Neutrophils Relative %: 88 %
Platelet Count: UNDETERMINED 10*3/uL (ref 150–400)
RBC: 3.38 MIL/uL — ABNORMAL LOW (ref 4.22–5.81)
RDW: 15.9 % — ABNORMAL HIGH (ref 11.5–15.5)
WBC Count: 14.2 10*3/uL — ABNORMAL HIGH (ref 4.0–10.5)
nRBC: 0 % (ref 0.0–0.2)

## 2023-10-25 LAB — T4, FREE: Free T4: 0.68 ng/dL (ref 0.61–1.12)

## 2023-10-25 LAB — TSH: TSH: 0.781 u[IU]/mL (ref 0.350–4.500)

## 2023-10-25 NOTE — Assessment & Plan Note (Signed)
 On levothyroxine 50 mcg Checking TSH and fT4

## 2023-10-26 ENCOUNTER — Telehealth: Payer: Self-pay

## 2023-10-26 NOTE — Telephone Encounter (Signed)
Patient is aware of rescheduled appointment times/dates 

## 2023-10-28 ENCOUNTER — Other Ambulatory Visit: Payer: Medicare Other

## 2023-10-28 ENCOUNTER — Ambulatory Visit: Payer: Medicare Other

## 2023-10-29 ENCOUNTER — Other Ambulatory Visit: Payer: Self-pay

## 2023-10-29 ENCOUNTER — Telehealth: Payer: Self-pay

## 2023-10-29 NOTE — Patient Outreach (Signed)
Care Management  Transitions of Care Program Transitions of Care Post-discharge week 4   10/29/2023 Name: [name] [name] MRN: [id number] DOB: [birth year]  Subjective: [name] [name] is a 89 y.o. year old male who is a primary care patient of [name], MD. The Care Management team Engaged with patient Engaged with patient by telephone to assess and address transitions of care needs.   Consent to Services:  Patient was given information about care management services, agreed to services, and gave verbal consent to participate.   Assessment:   Patient remains very optimistic about his healthcare team's direction and the plans for treating his cancer. He has family that reside with him, and they are very supportive. He verbalizes enjoying and looking forward to time with his family.  Patient states "I am looking forward to reaching a time when cancer is not the center of my day", Expresses desire for "reaching the next steps of life after surgery to remove cancer".  RNCM reviewed patient instructions from his 10/25/2023 appointment with his Oncologist, Dr. [name].  Patient asked very appropriate questions and verbalized understanding.  Patient reports that the day prior to our call, Thursday, 12/5, he felt unusually tired and noticed his urine was brownish in color, with pink streaking and small blood clots.  However, he indicated after voiding a couple of times, his urine began to return to its normal color.  Today, he denies any pain with urination, no changes in his urinary output and has not noticed any blood streaks or clots in his urine.  We reviewed in detail when to notify his physician for changes in his fatigue and/or pain level, as well as changes in the color, consistency, or amount of urine.  Patient also reports a change in his daily weights - with his baseline weight on home scales at approximately 166 lbs. and most recent, gradually past few days, increase to 174 lbs.  He  attributes the weight gain to better eating, now that he has family residing with him, and the recent addition of an oral Prednisone medication and increased fluid intake.  He denies any chest, shortness of breath or swelling in his extremities.  Patient instructed to notify his physician, for precaution, should his weight continue to increase over the 24 hours or if he develops shortness of breath, chest pain, swelling or fatigue (unrelieved by rest) to notify his physician immediately.  Patient verbalized 100% understanding and compliance of today's self-care instructions.       SDOH Interventions    Flowsheet Row Patient Outreach from 10/12/2023 in [place] POPULATION HEALTH DEPARTMENT Telephone from 10/05/2023 in [place] POPULATION HEALTH DEPARTMENT  SDOH Interventions    Food Insecurity Interventions -- Intervention Not Indicated  Housing Interventions -- Intervention Not Indicated  Transportation Interventions -- Intervention Not Indicated  Utilities Interventions -- Intervention Not Indicated  Financial Strain Interventions Intervention Not Indicated --  Physical Activity Interventions Intervention Not Indicated --  Social Connections Interventions Intervention Not Indicated --  Health Literacy Interventions Intervention Not Indicated --        Goals Addressed             This Visit's Progress    TOC Care Plan       Current Barriers:  Knowledge deficit related to the management, support and education needs related to Chronic Kidney Disease and Bladder Cancer diagnosis.  RNCM Clinical Goal(s):  Patient will work with the Care Management team over the next 30 days to address Transition  of Care Barriers: Provider appointments Patient will be observant for urinary changes and/or any other changes in medical condition from his normal baseline and promptly notify his physician take all medications exactly as prescribed and will call provider for medication related questions  as evidenced by patient report attend all scheduled medical appointments: PCP and specialist as evidenced by patient report and review of EMR through collaboration with RN Care manager, provider, and care team.  Patient will follow recommended renal diet and oral fluid intake.  Interventions: Evaluation of current treatment plan related to  self management and patient's adherence to plan as established by provider. Patient education on: CKD, treatment and self-management techniques.  Patient verbalized good understanding. Reviewed Provider appointments :  Patient has appointment scheduled with Oncology and repeat lab work on 11/04/2023. Reviewed medications/medication adherence:  Reviewed recent medication changes: Prednisone taper to 40mg  starting 10/29/23, [name] [facility]) remains on hold and increase oral fluid intake to help improve kidney function.  Verbalized  good understanding of current medication changes.  Transitions of Care:  Goal on track:  Yes. Patient education on increasing fluid intake to 60-70 oz/per day, per [name] instructions from his 10/25/23 office visit.  Discussed strategies for getting in the additional fluids throughout the daytime to decrease freq. trips to the bathroom at night while trying to sleep.  Verbalized good understanding and compliance. Upcoming Specialist Doctor Visits  - discussed the importance of keeping scheduled lab work and doctor appointments. Patient verbalized adherence and reports his next follow-up appointment is 11/04/23 with Dr. [name], Oncology Patient education on  importance of observing for changes in urinary output, pain with urination,  bloody/blood-tinged urine, passing of blood clots in urine or obtunded fatigue and to promptly notify his treating physician of any changes.  Verbalized good understanding of when to contact his treating physician.  Oncology:   Goal on track:  Yes. Assessment of understanding of oncology diagnosis  and treatment options per his oncologist: Bladder Cancer.  Discussed recommended treatment per 12/2 Oncology appointment.  Patient asked appropriate questions and verbalized understanding. Assessed patient understanding of cancer diagnosis and recommended treatment plan.  Patient verbal education included review of patient instructions per 10/25/23 appt. With Oncology:  Plan is for surgery to address bladder cancer with scheduling pending repeat lab work to reassess kidney function; stop further [name] treatments; ongoing oral Prednisone taper; increase oral fluid intake 60-70 oz/day and follow-up appointment 11/04/2023 for repeat lab work and follow-up appointment Dr. [name].   Patient Goals/Self-Care Activities: Participate in Transition of Care Program/Attend [facility] scheduled calls Notify RN Care Manager of TOC call rescheduling needs Take all medications as prescribed Attend all scheduled provider appointments Call provider office for new concerns or questions  Provided my contact information for patient to call me if needed prior to his next scheduled TOC call.   Follow Up Plan:  Telephone follow up appointment with care management team member scheduled for:  11/05/2023 at 11 AM.          Plan: Telephone follow up appointment with care management team member scheduled for: 11/05/23 at 11 AM. The patient has been provided with contact information for the care management team and has been advised to call with any health related questions or concerns.   Starling Jessie Daphine Deutscher BSN, RN RN Care Manager   Transitions of Care [facility] - [facility] Direct Dial Number:  [phone]

## 2023-10-29 NOTE — Patient Outreach (Deleted)
Care Management  Transitions of Care Program Transitions of Care Post-discharge week 4   10/29/2023 Name: [name] [name] MRN: [id number] DOB: [birth year]  Subjective: [name] [name] is a 89 y.o. year old male who is a primary care patient of [name], MD. The Care Management team Engaged with patient Engaged with patient by telephone to assess and address transitions of care needs.   Consent to Services:  Patient was given information about care management services, agreed to services, and gave verbal consent to participate.   Assessment:   Patient remains very optimistic about his healthcare team's direction and the plans for treating his cancer. He has family that reside with him and they are very supportive. He verbalizes enjoying and looking forward to time with his family.  Patient states "I am looking forward to reaching a time when cancer is not the center of my day", Expresses desire for "reaching the next steps of life after surgery to remove cancer".  RNCM reviewed patient instructions from his 10/25/2023 appointment with his Oncologist.  Patient asked very appropriate questions and verbalized understanding.  Patient reports that the day prior to our call, Thursday, 12/5, he felt unusually tired and noticed his urine was brownish in color, with pink streaking and small blood clots.  However, he indicated after voiding a couple of times, his urine began to return to its normal color.  Today, he denies any pain with urination, no changes in his urinary output and has not noticed any blood streaks or clots in his urine.  We reviewed in detail when to notify his physician for changes in his fatigue and/or pain level, as well as, changes in the color, consistency or amount of urine.  Patient also reports a change in his daily weights - with his baseline weight on home scales at approximately 166 lbs and most recent, gradually past few days, increase to 174 lbs.  He attributes  the weight gain to better eating, now that he has family residing with him, and the recent addition of an oral Prednisone medication and increased fluid intake.  He denies any chest, shortness of breath or swelling in his extremities.  Patient instructed to notify his physician, for precaution, should his weight continue to increase over the 24 hours or if he develops shortness of breath, chest pain, swelling or fatigue (unrelieved by rest) to notify his physician immediately.  Patient verbalized 100% understanding and compliance of today's self-care instructions.      SDOH Interventions    Flowsheet Row Patient Outreach from 10/12/2023 in [place] POPULATION HEALTH DEPARTMENT Telephone from 10/05/2023 in [place] POPULATION HEALTH DEPARTMENT  SDOH Interventions    Food Insecurity Interventions -- Intervention Not Indicated  Housing Interventions -- Intervention Not Indicated  Transportation Interventions -- Intervention Not Indicated  Utilities Interventions -- Intervention Not Indicated  Financial Strain Interventions Intervention Not Indicated --  Physical Activity Interventions Intervention Not Indicated --  Social Connections Interventions Intervention Not Indicated --  Health Literacy Interventions Intervention Not Indicated --        Goals Addressed             This Visit's Progress    TOC Care Plan       Current Barriers:  Knowledge deficit related to the management, support and education needs related to Chronic Kidney Disease and Bladder Cancer diagnosis.  RNCM Clinical Goal(s):  Patient will work with the Care Management team over the next 30 days to address Transition of Care Barriers:  Provider appointments Patient will be observant for urinary changes and/or any other changes in medical condition from his normal baseline and promptly notify his physician take all medications exactly as prescribed and will call provider for medication related questions as evidenced  by patient report attend all scheduled medical appointments: PCP and specialist as evidenced by patient report and review of EMR through collaboration with RN Care manager, provider, and care team.  Patient will follow recommended renal diet and oral fluid intake.  Interventions: Evaluation of current treatment plan related to  self management and patient's adherence to plan as established by provider. Patient education on: CKD, treatment and self-management techniques.  Patient verbalized good understanding. Reviewed Provider appointments :  Patient has appointment scheduled with Oncology and repeat lab work on 11/04/2023. Reviewed medications/medication adherence:  Reviewed recent medication changes: Prednisone taper to 40mg  starting 10/29/23, [name] [facility]) remains on hold and increase oral fluid intake to help improve kidney function.  Verbalized  good understanding of current medication changes.  Transitions of Care:  Goal on track:  Yes. Patient education on increasing fluid intake to 60-70 oz/per day, per [name] instructions from his 10/25/23 office visit.  Discussed strategies for getting in the additional fluids throughout the daytime to decrease freq. trips to the bathroom at night while trying to sleep.  Verbalized good understanding and compliance. Upcoming Specialist Doctor Visits  - discussed the importance of keeping scheduled lab work and doctor appointments. Patient verbalized adherence and reports his next follow-up appointment is 11/04/23 with Dr. [name], Oncology Patient education on  importance of observing for changes in urinary output, pain with urination,  bloody/blood-tinged urine, passing of blood clots in urine or obtunded fatigue and to promptly notify his treating physician of any changes.  Verbalized good understanding of when to contact his treating physician.  Oncology:   Goal on track:  Yes. Assessment of understanding of oncology diagnosis and treatment  options per his oncologist: Bladder Cancer.  Discussed recommended treatment per 12/2 Oncology appointment.  Patient asked appropriate questions and verbalized understanding. Assessed patient understanding of cancer diagnosis and recommended treatment plan.  Patient verbal education included review of patient instructions per 10/25/23 appt. With Oncology:  Plan is for surgery to address bladder cancer with scheduling pending repeat lab work to reassess kidney function; stop further [name] treatments; ongoing oral Prednisone taper; increase oral fluid intake 60-70 oz/day and follow-up appointment 11/04/2023 for repeat lab work and follow-up appointment Dr. [name].   Patient Goals/Self-Care Activities: Participate in Transition of Care Program/Attend [facility] scheduled calls Notify RN Care Manager of TOC call rescheduling needs Take all medications as prescribed Attend all scheduled provider appointments Call provider office for new concerns or questions  Provided my contact information for patient to call me if needed prior to his next scheduled TOC call.   Follow Up Plan:  Telephone follow up appointment with care management team member scheduled for:  11/05/2023 at 11 AM.          Plan: Telephone follow up appointment with care management team member scheduled for:  11/05/23 at 11 AM The patient has been provided with contact information for the care management team and has been advised to call with any health related questions or concerns.   Maxime Beckner Daphine Deutscher BSN, RN RN Care Manager   Transitions of Care [facility] - [facility] Direct Dial Number:  [phone]

## 2023-10-29 NOTE — Patient Outreach (Signed)
  Care Management  Transitions of Care Program Transitions of Care Post-discharge week 4  10/29/2023 Name: [name] [name] MRN: [id number] DOB: 05/13/1932  Subjective: [name] [name] is a 89 y.o. year old male who is a primary care patient of [name], MD. The Care Management team was unable to reach the patient by phone to assess and address transitions of care needs.   Plan: Additional outreach attempts will be made to reach the patient enrolled in the [facility] Program (Post Inpatient/ED Visit).  Amarianna Abplanalp Daphine Deutscher BSN, RN RN Care Manager   Transitions of Care [facility] - [facility] Direct Dial Number:  [phone]

## 2023-11-02 ENCOUNTER — Other Ambulatory Visit: Payer: Medicare Other

## 2023-11-02 ENCOUNTER — Ambulatory Visit: Payer: Medicare Other

## 2023-11-02 DIAGNOSIS — N184 Chronic kidney disease, stage 4 (severe): Secondary | ICD-10-CM | POA: Diagnosis not present

## 2023-11-02 NOTE — Assessment & Plan Note (Signed)
Low b12 and iron deficiency B12 500 mcg daily Ferrous sulfate 325 mg daily

## 2023-11-02 NOTE — Assessment & Plan Note (Signed)
Continue B12 500 mcg daily

## 2023-11-02 NOTE — Assessment & Plan Note (Addendum)
On levothyroxine 50 mcg Checking TSH and fT4 in 3 weeks

## 2023-11-02 NOTE — Assessment & Plan Note (Signed)
stop pembro and plan for surgery

## 2023-11-02 NOTE — Progress Notes (Unsigned)
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   Patient received 1 dose of pembrolizumab and developed elevated creatinine up to 4.07. Baseline mostly below 3.4.  Repeat UA without signs of infectious process.  AKI on CKD improved on prednisone 60 mg daily for 1 week now on 50 mg daily.  Overall feeling better.  Cr 2.99 today  AKI (acute kidney injury) (HCC) Labs today showed Cr of 2.99 Decrease prednisone to 30 mg daily starting on Saturday for 5 days, then 20 mg daily for 5 days, then 10 mg for 5 days, then stop.  Repeat labs in 3 weeks and see me Stop treatment Report to ED if unable to urinate, nausea, profound fatigue Increase fluid to 60-70 oz per day  Malignant neoplasm of urinary bladder (HCC) stop pembro and plan for surgery  Hypothyroidism On levothyroxine 50 mcg Checking TSH and fT4 in 3 weeks  Normocytic anemia Low b12 and iron deficiency B12 500 mcg daily Ferrous sulfate 325 mg daily  Low serum vitamin B12 Continue B12 500 mcg daily       The patient understands the plans discussed today and is in agreement with them.  He knows to contact our office if he develops concerns prior to his next appointment.  [name], MD  [facility] - A DEPT OF [facility] [address] [place] [address] Dept: [phone] Dept Fax: [phone]   Orders Placed This Encounter  Procedures   Basic Metabolic Panel - Cancer Center  Only    Standing Status:   Future    Number of Occurrences:   1    Expiration Date:   11/01/2024   CBC with Differential (Cancer Center Only)    Standing Status:   Future    Expiration Date:   11/03/2024   TSH    Standing Status:   Future    Expiration Date:   12/05/2023   T4, free    Standing Status:   Future    Expiration Date:   12/05/2023      CHIEF COMPLAINT:  CC: UC  Current Treatment:  on hold  INTERVAL HISTORY:  [name] [name] is here today for repeat clinical assessment.  He reports of good urine output and frequently. Report of good control. One episode of blood and a few days of pink color and now resolved. No dysuria, burning pain, fever.  No abdominal pain, nausea, vomiting, coughing, rash. He feels weak in the legs and off balance. No headache, focal weakness. No chest pain or palpitation.  I have reviewed the past medical history, past surgical history, social history and family history with the patient and they are unchanged from previous note.  ALLERGIES:  is allergic to macrobid [nitrofurantoin], ciprofloxacin, keytruda [pembrolizumab], tramadol hcl, cephalexin, and jelmyto [mitomycin].  MEDICATIONS:  Current Outpatient Medications  Medication Sig Dispense Refill   amLODipine (NORVASC) 10 MG tablet Take 1 tablet (10 mg total) by mouth daily. (Patient taking differently: Take 10 mg by mouth at bedtime.) 90 tablet 1  aspirin EC 81 MG tablet Take 81 mg by mouth every evening. Swallow whole.     ferrous sulfate 325 (65 FE) MG tablet Take 325 mg by mouth daily with supper.     finasteride (PROSCAR) 5 MG tablet TAKE ONE TABLET BY MOUTH DAILY (Patient taking differently: Take 5 mg by mouth at bedtime.) 90 tablet 3   levothyroxine (SYNTHROID, LEVOTHROID) 50 MCG tablet Take 50 mcg by mouth daily before breakfast.     MYRBETRIQ 50 MG TB24 tablet Take 50 mg by mouth at bedtime.     pantoprazole (PROTONIX) 20 MG tablet Take 20 mg by mouth at bedtime.     predniSONE (DELTASONE)  10 MG tablet Start at 60 mg today, then once a day in the morning starting tomorrow after breakfast for 1 week, followed by 50 mg daily for 1 week, and see me before further changes 140 tablet 0   psyllium (METAMUCIL) 58.6 % powder Take 0.5 packets by mouth every evening. (Patient not taking: Reported on 10/25/2023)     rosuvastatin (CRESTOR) 5 MG tablet Take 5 mg by mouth at bedtime.     senna-docusate (SENOKOT-S) 8.6-50 MG tablet Take 1 tablet by mouth 2 (two) times daily. While taking strong pain meds to prevent constipation 10 tablet 0   silodosin (RAPAFLO) 8 MG CAPS capsule TAKE ONE CAPSULE BY MOUTH EVERY NIGHT AT BEDTIME 90 capsule 3   sodium bicarbonate 650 MG tablet Take 650 mg by mouth See admin instructions. Take 650 mg by mouth at noon and midnight     TYLENOL 8 HOUR ARTHRITIS PAIN 650 MG CR tablet Take 1,300 mg by mouth every 8 (eight) hours as needed for pain. (Patient not taking: Reported on 10/25/2023)     No current facility-administered medications for this visit.    HISTORY OF PRESENT ILLNESS:   Oncology History Overview Note  2000 BPH with elevated PSA. Report biopsy was benign.   2011 to now with multiple TURBT   2021 TaG3 bladder cancer. TURBT + BCG x 6  2023 TaG1 bladder + left renal pelvis cancer, failed [name] >>  08/2022 Left nephroureterectomy + RPLND. 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  06/2023 cysto w/ TURBT multifocal papillary bladder tumor recurrence (dome, left trigone, bladder neck, prostate urethral tissue) ==> TaG3 with negative muscle  Path: A. BLADDER TUMOR, TURBT:  Noninvasive high  grade papillary urothelial carcinoma with inverted  growth pattern  muscularis propria (detrusor muscle) is present and not involved by  carcinoma   B. BLADDER TUMOR, NECK, BIOPSY:  Noninvasive high-grade papillary urothelial carcinoma with inverted  growth pattern  Submucosa, prostatic glands and muscularis is present and not involved  by carcinoma   C. BLADDER TUMOR, BASE, BIOPSY:  Noninvasive high grade papillary urothelial carcinoma  Muscularis propria is present and not involved by carcinoma       Malignant neoplasm of urinary bladder (HCC)  09/15/2019 Initial Diagnosis   Malignant neoplasm of urinary bladder (HCC)   08/05/2023 Cancer Staging   Staging form: Urinary Bladder, AJCC 8th Edition - Clinical: Stage 0a (rcTa, cN0, cM0) - Signed by [name], MD on 08/05/2023 Stage  prefix: Recurrence WHO/ISUP grade (low/high): High Grade Histologic grading system: 2 grade system   09/16/2023 - 09/16/2023 Chemotherapy   Patient is on Treatment Plan : BLADDER Pembrolizumab (200) q21d     10/01/2023 Pathology Results   Presented with hematuria and underwent TURBT  A. BLADDER TUMOR, TURBT:  Noninvasive high grade papillary urothelial carcinoma  Muscularis propria (detrusor muscle) is not present        REVIEW OF SYSTEMS:   All relevant systems were reviewed with the patient and are negative.   VITALS:  Blood pressure (!) 153/71, pulse 78, resp. rate 18, weight 176 lb 6.4 oz (80 kg), SpO2 100%.  Wt Readings from Last 3 Encounters:  11/04/23 176 lb 6.4 oz (80 kg)  10/25/23 173 lb 12.8 oz (78.8 kg)  10/15/23 165 lb 9.6 oz (75.1 kg)    Body mass index is 26.82 kg/m.  Performance status (ECOG): 2 - Symptomatic, <50% confined to bed  PHYSICAL EXAM:   GENERAL:alert, no distress and comfortable SKIN: skin color normal, no rashes  EYES: normal, sclera clear OROPHARYNX: no exudate, no erythema    NECK: supple,  non-tender, without nodularity LUNGS: clear to  auscultation with normal breathing effort.  No wheeze or rales HEART: regular rate & rhythm and no murmurs and no lower extremity edema ABDOMEN: abdomen soft, non-tender and nondistended Musculoskeletal: no edema NEURO: alert  LABORATORY DATA:  I have reviewed the data as listed    Component Value Date/Time   NA 139 11/04/2023 1454   K 5.2 (H) 11/04/2023 1454   CL 108 11/04/2023 1454   CO2 21 (L) 11/04/2023 1454   GLUCOSE 163 (H) 11/04/2023 1454   BUN 98 (H) 11/04/2023 1454   CREATININE 2.99 (H) 11/04/2023 1454   CALCIUM 8.7 (L) 11/04/2023 1454   PROT 6.7 10/25/2023 0935   ALBUMIN 4.0 10/25/2023 0935   AST 18 10/25/2023 0935   ALT 29 10/25/2023 0935   ALKPHOS 55 10/25/2023 0935   BILITOT 0.6 10/25/2023 0935   GFRNONAA 19 (L) 11/04/2023 1454   GFRAA 35 (L) 08/23/2020 0524    No results found for: "SPEP", "UPEP"  Lab Results  Component Value Date   WBC 14.2 (H) 10/25/2023   NEUTROABS 12.5 (H) 10/25/2023   HGB 10.1 (L) 10/25/2023   HCT 30.6 (L) 10/25/2023   MCV 90.5 10/25/2023   PLT PLATELET CLUMPS NOTED ON SMEAR, UNABLE TO ESTIMATE 10/25/2023      Chemistry      Component Value Date/Time   NA 139 11/04/2023 1454   K 5.2 (H) 11/04/2023 1454   CL 108 11/04/2023 1454   CO2 21 (L) 11/04/2023 1454   BUN 98 (H) 11/04/2023 1454   CREATININE 2.99 (H) 11/04/2023 1454      Component Value Date/Time   CALCIUM 8.7 (L) 11/04/2023 1454   ALKPHOS 55 10/25/2023 0935   AST 18 10/25/2023 0935   ALT 29 10/25/2023 0935   BILITOT 0.6 10/25/2023 0935       RADIOGRAPHIC STUDIES: I have personally reviewed the radiological images as listed and agreed with the findings in the report. No results found.

## 2023-11-02 NOTE — Assessment & Plan Note (Signed)
No improved on hydration. 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 [name] [name] has no signs of UTI.  Negative UA. Improvement after started prednisone at 60 mg daily.  Labs today showed Cr of  Decrease prednisone to 30 mg daily starting on Friday. Repeat labs next Thursday and see me Stop treatment Report to ED if unable to urinate, nausea, profound fatigue Increase fluid to 60-70 oz per day

## 2023-11-04 ENCOUNTER — Inpatient Hospital Stay: Payer: Medicare Other

## 2023-11-04 ENCOUNTER — Other Ambulatory Visit: Payer: Medicare Other

## 2023-11-04 VITALS — BP 153/71 | HR 78 | Resp 18 | Wt 176.4 lb

## 2023-11-04 DIAGNOSIS — C678 Malignant neoplasm of overlapping sites of bladder: Secondary | ICD-10-CM | POA: Diagnosis not present

## 2023-11-04 DIAGNOSIS — N189 Chronic kidney disease, unspecified: Secondary | ICD-10-CM

## 2023-11-04 DIAGNOSIS — E039 Hypothyroidism, unspecified: Secondary | ICD-10-CM | POA: Diagnosis not present

## 2023-11-04 DIAGNOSIS — E538 Deficiency of other specified B group vitamins: Secondary | ICD-10-CM

## 2023-11-04 DIAGNOSIS — C677 Malignant neoplasm of urachus: Secondary | ICD-10-CM

## 2023-11-04 DIAGNOSIS — D649 Anemia, unspecified: Secondary | ICD-10-CM

## 2023-11-04 DIAGNOSIS — E611 Iron deficiency: Secondary | ICD-10-CM | POA: Diagnosis not present

## 2023-11-04 DIAGNOSIS — N179 Acute kidney failure, unspecified: Secondary | ICD-10-CM

## 2023-11-04 DIAGNOSIS — Z7952 Long term (current) use of systemic steroids: Secondary | ICD-10-CM | POA: Diagnosis not present

## 2023-11-04 LAB — BASIC METABOLIC PANEL - CANCER CENTER ONLY
Anion gap: 10 (ref 5–15)
BUN: 98 mg/dL — ABNORMAL HIGH (ref 8–23)
CO2: 21 mmol/L — ABNORMAL LOW (ref 22–32)
Calcium: 8.7 mg/dL — ABNORMAL LOW (ref 8.9–10.3)
Chloride: 108 mmol/L (ref 98–111)
Creatinine: 2.99 mg/dL — ABNORMAL HIGH (ref 0.61–1.24)
GFR, Estimated: 19 mL/min — ABNORMAL LOW (ref >60)
Glucose, Bld: 163 mg/dL — ABNORMAL HIGH (ref 70–99)
Potassium: 5.2 mmol/L — ABNORMAL HIGH (ref 3.5–5.1)
Sodium: 139 mmol/L (ref 135–145)

## 2023-11-04 NOTE — Patient Instructions (Signed)
For prednisone, continue 40 mg tomorrow. Decrease prednisone to 30 mg daily starting on Saturday for 5 days, then 20 mg daily for 5 days, then 10 mg for 5 days, then stop.  Repeat labs in 3 weeks and see me Continue daily b12, vitamin D and iron.

## 2023-11-05 ENCOUNTER — Telehealth: Payer: Self-pay

## 2023-11-05 NOTE — Patient Outreach (Signed)
Care Management  Transitions of Care Program Transitions of Care Post-discharge Discharge Visit from [facility]   11/05/2023 Name: [name] [name] MRN: [id number] DOB: Apr 14, 1932  Subjective: [name] [name] is a 89 y.o. year old male who is a primary care patient of [name] Fick, MD. The Care Management team Engaged with patient by telephone to assess and address transitions of care needs.   Consent to Services:  Patient was given information about care management services, agreed to services, and gave verbal consent to participate.   Assessment:   [name] [name] has progressed well with his Transition of Care (TOC) goals and successfully completed the program.  With the close monitoring by his PCP, Dr. [name], MD, his Oncologist, Dr. Cherly [name] and Urologist, Dr. Kathrynn [name], they have monitored his kidney functions, made medication changes and hope to take him to surgery for possible bladder removal in the near future.  [name] [name] is a very compliant, optimistic and engaging patient.  He looks forward to the ongoing support from our Complex Care Program.      SDOH Interventions    Flowsheet Row Patient Outreach from 10/12/2023 in [place] [facility] Telephone from 10/05/2023 in [place] [facility] DEPART[facility]  SDOH Interventions    Food Insecurity Interventions -- Intervention Not Indicated  Housing Interventions -- Intervention Not Indicated  Transportation Interventions -- Intervention Not Indicated  Utilities Interventions -- Intervention Not Indicated  Financial Strain Interventions Intervention Not Indicated --  Physical Activity Interventions Intervention Not Indicated --  Social Connections Interventions Intervention Not Indicated --  Health Literacy Interventions Intervention Not Indicated --       Goals Addressed             This Visit's Progress    COMPLETED: TOC Care Plan       Current Barriers:  Knowledge deficit  related to the management, support and education needs related to Chronic Kidney Disease and Bladder Cancer diagnosis.  RNCM Clinical Goal(s):  Patient will work with the Care Management team over the next 30 days to address Transition of Care Barriers: Provider appointments Patient will be observant for urinary changes and/or any other changes in medical condition from his normal baseline and promptly notify his physician take all medications exactly as prescribed and will call provider for medication related questions as evidenced by patient report attend all scheduled medical appointments: PCP and specialist as evidenced by patient report and review of EMR through collaboration with RN Care manager, provider, and care team.  Patient will follow recommended renal diet and oral fluid intake. No hospital readmissions during TOC 30 days Program.  Transitions of Care:  Goal Met. Patient education on increasing fluid intake to 60-70 oz/per day, per Dr. Nelta [name] instructions from his 10/25/23 office visit.  Discussed strategies for getting in the additional fluids throughout the daytime to decrease freq. trips to the bathroom at night while trying to sleep.  Verbalized good understanding and compliance. Upcoming Specialist Doctor Visits  - discussed the importance of keeping scheduled lab work and doctor appointments. Patient verbalized adherence and reports his next follow-up appointment is 11/04/23 with Dr. Kathrynn [name], Oncology Patient education on  importance of observing for changes in urinary output, pain with urination,  bloody/blood-tinged urine, passing of blood clots in urine or obtunded fatigue and to promptly notify his treating physician of any changes.  Verbalized good understanding of when to contact his treating physician.  Oncology:   Goal on track:  Yes. Assessment of understanding of oncology diagnosis  and treatment options per his oncologist:  Continue to monitor kidney function closely;  remains on an oral Prednisone taper and push oral fluid.   Patient asked appropriate questions and verbalized understanding. Ongoing assessment of patient understanding of cancer diagnosis and recommended treatment plan.   Oncology:  Plan is for surgery to address bladder cancer with scheduling pending repeat lab work to reassess kidney function; stop further [name] treatments; ongoing oral Prednisone taper; increase oral fluid intake 60-70 oz/day and follow-up appointment 11/09/2023 for repeat lab work and follow-up appointment Dr. Cherly [name]. Bladder removal surgery date pending    Patient Goals/Self-Care Activities: Participate in Complex Care Manager([name] [name]) program/Attend [name] [name] scheduled calls. Notify RN Care Manager of [name] [name] call rescheduling needs Take all medications as prescribed Attend all scheduled provider appointments Call provider office for new concerns or questions or changes in condition from your usual baseline. Continue to work with [name] [name], Renal Coordinator/Health Coach on [employer]. The contact name and phone number for your [name] [name] is [name] [name] and she will reach out to schedule an appointment with you.  Case transferred to longitudinal Care Management Team.      Plan: Case transferred to longitudinal Care Management Team.  Best of luck to you, [name] [name],  [name] [name] BSN, RN RN Care Manager   Transitions of Care VBCI - [facility] Direct Dial Number:  [phone]

## 2023-11-05 NOTE — Progress Notes (Signed)
This encounter was created in error - please disregard.

## 2023-11-13 ENCOUNTER — Other Ambulatory Visit: Payer: Self-pay

## 2023-11-13 ENCOUNTER — Emergency Department (HOSPITAL_BASED_OUTPATIENT_CLINIC_OR_DEPARTMENT_OTHER)
Admission: EM | Admit: 2023-11-13 | Discharge: 2023-11-14 | Disposition: A | Discharge status: Home / Self Care | Payer: Medicare Other | Attending: Emergency Medicine | Admitting: Emergency Medicine

## 2023-11-13 ENCOUNTER — Encounter (HOSPITAL_BASED_OUTPATIENT_CLINIC_OR_DEPARTMENT_OTHER): Payer: Self-pay

## 2023-11-13 DIAGNOSIS — M79671 Pain in right foot: Secondary | ICD-10-CM | POA: Diagnosis present

## 2023-11-13 DIAGNOSIS — Z7982 Long term (current) use of aspirin: Secondary | ICD-10-CM | POA: Insufficient documentation

## 2023-11-13 DIAGNOSIS — Z85850 Personal history of malignant neoplasm of thyroid: Secondary | ICD-10-CM | POA: Insufficient documentation

## 2023-11-13 DIAGNOSIS — Z8551 Personal history of malignant neoplasm of bladder: Secondary | ICD-10-CM | POA: Insufficient documentation

## 2023-11-13 DIAGNOSIS — M62838 Other muscle spasm: Secondary | ICD-10-CM | POA: Diagnosis not present

## 2023-11-13 LAB — COMPREHENSIVE METABOLIC PANEL
ALT: 33 U/L (ref 0–44)
AST: 14 U/L — ABNORMAL LOW (ref 15–41)
Albumin: 4.5 g/dL (ref 3.5–5.0)
Alkaline Phosphatase: 63 U/L (ref 38–126)
Anion gap: 16 — ABNORMAL HIGH (ref 5–15)
BUN: 85 mg/dL — ABNORMAL HIGH (ref 8–23)
CO2: 17 mmol/L — ABNORMAL LOW (ref 22–32)
Calcium: 9 mg/dL (ref 8.9–10.3)
Chloride: 106 mmol/L (ref 98–111)
Creatinine, Ser: 3.54 mg/dL — ABNORMAL HIGH (ref 0.61–1.24)
GFR, Estimated: 16 mL/min — ABNORMAL LOW (ref >60)
Glucose, Bld: 171 mg/dL — ABNORMAL HIGH (ref 70–99)
Potassium: 5 mmol/L (ref 3.5–5.1)
Sodium: 139 mmol/L (ref 135–145)
Total Bilirubin: 0.7 mg/dL (ref <1.2)
Total Protein: 7.4 g/dL (ref 6.5–8.1)

## 2023-11-13 LAB — CBC WITH DIFFERENTIAL/PLATELET
Abs Immature Granulocytes: 0.09 10*3/uL — ABNORMAL HIGH (ref 0.00–0.07)
Basophils Absolute: 0 10*3/uL (ref 0.0–0.1)
Basophils Relative: 0 %
Eosinophils Absolute: 0 10*3/uL (ref 0.0–0.5)
Eosinophils Relative: 0 %
HCT: 35.7 % — ABNORMAL LOW (ref 39.0–52.0)
Hemoglobin: 12.1 g/dL — ABNORMAL LOW (ref 13.0–17.0)
Immature Granulocytes: 1 %
Lymphocytes Relative: 2 %
Lymphs Abs: 0.2 10*3/uL — ABNORMAL LOW (ref 0.7–4.0)
MCH: 30.4 pg (ref 26.0–34.0)
MCHC: 33.9 g/dL (ref 30.0–36.0)
MCV: 89.7 fL (ref 80.0–100.0)
Monocytes Absolute: 0.2 10*3/uL (ref 0.1–1.0)
Monocytes Relative: 2 %
Neutro Abs: 12.7 10*3/uL — ABNORMAL HIGH (ref 1.7–7.7)
Neutrophils Relative %: 95 %
Platelets: 224 10*3/uL (ref 150–400)
RBC: 3.98 MIL/uL — ABNORMAL LOW (ref 4.22–5.81)
RDW: 18 % — ABNORMAL HIGH (ref 11.5–15.5)
WBC: 13.2 10*3/uL — ABNORMAL HIGH (ref 4.0–10.5)
nRBC: 0 % (ref 0.0–0.2)

## 2023-11-13 MED ORDER — LACTATED RINGERS IV BOLUS
1000.0000 mL | Freq: Once | INTRAVENOUS | Status: AC
  Administered 2023-11-14: 1000 mL via INTRAVENOUS

## 2023-11-13 NOTE — ED Notes (Signed)
Lab to add CK, mag to previously drawn specimens

## 2023-11-13 NOTE — ED Triage Notes (Signed)
Pt c/o bilateral hand/ finger "uncontrolled movements, R foot cramping at night. Pt states he's been drinking "a lot, trying to stay hydrated since I have one kidney & it's failing." Hx one [place] tx "& it's caused problems," hx bladder cancer to one kidney- since removed, on steroids for inflammation.

## 2023-11-14 LAB — BASIC METABOLIC PANEL
Anion gap: 11 (ref 5–15)
BUN: 80 mg/dL — ABNORMAL HIGH (ref 8–23)
CO2: 20 mmol/L — ABNORMAL LOW (ref 22–32)
Calcium: 8 mg/dL — ABNORMAL LOW (ref 8.9–10.3)
Chloride: 109 mmol/L (ref 98–111)
Creatinine, Ser: 3.21 mg/dL — ABNORMAL HIGH (ref 0.61–1.24)
GFR, Estimated: 18 mL/min — ABNORMAL LOW (ref >60)
Glucose, Bld: 128 mg/dL — ABNORMAL HIGH (ref 70–99)
Potassium: 4.7 mmol/L (ref 3.5–5.1)
Sodium: 140 mmol/L (ref 135–145)

## 2023-11-14 LAB — URINALYSIS, ROUTINE W REFLEX MICROSCOPIC
Bilirubin Urine: NEGATIVE
Glucose, UA: 100 mg/dL — AB
Ketones, ur: NEGATIVE mg/dL
Nitrite: NEGATIVE
Protein, ur: 30 mg/dL — AB
Specific Gravity, Urine: 1.014 (ref 1.005–1.030)
pH: 5.5 (ref 5.0–8.0)

## 2023-11-14 LAB — CK: Total CK: 122 U/L (ref 49–397)

## 2023-11-14 LAB — POTASSIUM: Potassium: 5.2 mmol/L — ABNORMAL HIGH (ref 3.5–5.1)

## 2023-11-14 LAB — TROPONIN I (HIGH SENSITIVITY)
Troponin I (High Sensitivity): 42 ng/L — ABNORMAL HIGH (ref <18)
Troponin I (High Sensitivity): 43 ng/L — ABNORMAL HIGH (ref <18)

## 2023-11-14 LAB — TSH: TSH: 1.338 u[IU]/mL (ref 0.350–4.500)

## 2023-11-14 LAB — MAGNESIUM: Magnesium: 1.8 mg/dL (ref 1.7–2.4)

## 2023-11-14 MED ORDER — SODIUM ZIRCONIUM CYCLOSILICATE 10 G PO PACK
10.0000 g | PACK | Freq: Once | ORAL | Status: AC
  Administered 2023-11-14: 10 g via ORAL
  Filled 2023-11-14: qty 1

## 2023-11-14 MED ORDER — SODIUM CHLORIDE 0.9 % IV BOLUS
500.0000 mL | Freq: Once | INTRAVENOUS | Status: AC
  Administered 2023-11-14: 500 mL via INTRAVENOUS

## 2023-11-14 NOTE — ED Notes (Signed)
Pt ambulatory to restroom with standby assist

## 2023-11-14 NOTE — ED Provider Notes (Signed)
[place] EMERGENCY DEPARTMENT AT [facility] Provider Note   CSN: [id number] Arrival date & time: 11/13/23  2158     History  Chief Complaint  Patient presents with   Foot Cramping    R   Spasms    BUE- hands    [name] [name] is a 89 y.o. male.   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  States he has bilateral cramping to his thumbs and fingers randomly at times lasting for 2 to 3 minutes at a time.  First is cramping of the left thumb on the right thumb in the last for 2 or 3 minutes and resolves.  Does have associated pain when this occurs.  No cramping currently.  States is becoming more constant over the past 2 to 3 days.  Denies any difficulty breathing or difficulty swallowing.  No cough or fever.  No abdominal pain, nausea or vomiting.  States he only has 1 kidney secondary to cancer and has known kidney failure.  He has been trying to keep himself hydrated.  He had 1 dose of Keytruda about 5 weeks ago and this apparently made his kidney function worse and is currently on a course of prednisone to remedy this.  He is currently taking 30 mg of prednisone daily.  No current chemotherapy otherwise.  No chest pain or shortness of breath.  No nausea or vomiting.  No fever.  Feels cramping to his bilateral thumbs and hands when it occurs.  No other muscle aches or chills.  The history is provided by the patient and a relative.       Home Medications Prior to Admission medications   Medication Sig Start Date End Date Taking? Authorizing Provider  amLODipine (NORVASC) 10 MG tablet Take 1 tablet (10 mg total) by mouth daily. Patient taking differently: Take 10 mg by mouth at bedtime. 08/23/20   Almon Hercules, MD  aspirin EC 81 MG tablet Take 81 mg by mouth  every evening. Swallow whole.    [provider]  ferrous sulfate 325 (65 FE) MG tablet Take 325 mg by mouth daily with supper.    [provider]  finasteride (PROSCAR) 5 MG tablet TAKE ONE TABLET BY MOUTH DAILY Patient taking differently: Take 5 mg by mouth at bedtime. 10/28/20   McKenzie, Mardene Celeste, MD  levothyroxine (SYNTHROID, LEVOTHROID) 50 MCG tablet Take 50 mcg by mouth daily before breakfast. 12/25/15   [provider]  MYRBETRIQ 50 MG TB24 tablet Take 50 mg by mouth at bedtime.    [provider]  pantoprazole (PROTONIX) 20 MG tablet Take 20 mg by mouth at bedtime. 12/20/15   [provider]  predniSONE (DELTASONE) 10 MG tablet Start at 60 mg today, then once a day in the morning starting tomorrow after breakfast for 1 week, followed by 50 mg daily for 1 week, and see me before further changes 10/15/23   [name], MD  psyllium (METAMUCIL) 58.6 % powder Take 0.5 packets by mouth every evening.    [provider]  rosuvastatin (CRESTOR) 5 MG tablet Take 5 mg by mouth at bedtime.    [provider]  senna-docusate (SENOKOT-S) 8.6-50 MG tablet Take 1 tablet by mouth 2 (two) times daily. While taking strong pain meds to prevent  constipation 09/04/22   Manny, Delbert Phenix., MD  silodosin (RAPAFLO) 8 MG CAPS capsule TAKE ONE CAPSULE BY MOUTH EVERY NIGHT AT BEDTIME 07/25/20   McKenzie, Mardene Celeste, MD  sodium bicarbonate 650 MG tablet Take 650 mg by mouth See admin instructions. Take 650 mg by mouth at noon and midnight 11/05/22   [provider]  TYLENOL 8 HOUR ARTHRITIS PAIN 650 MG CR tablet Take 1,300 mg by mouth every 8 (eight) hours as needed for pain. Patient not taking: Reported on 10/25/2023    [provider]      Allergies    Macrobid [nitrofurantoin], Ciprofloxacin, Keytruda [pembrolizumab], Tramadol hcl, Cephalexin, and [name] [mitomycin]    Review of Systems   Review of Systems  Constitutional:   Positive for fatigue. Negative for activity change, appetite change and fever.  HENT:  Negative for congestion and rhinorrhea.   Respiratory:  Negative for cough and shortness of breath.   Cardiovascular:  Negative for chest pain.  Gastrointestinal:  Negative for abdominal pain, nausea and vomiting.  Genitourinary:  Negative for dysuria and hematuria.  Musculoskeletal:  Positive for arthralgias and myalgias.  Skin:  Negative for rash.  Neurological:  Positive for tremors and weakness. Negative for dizziness and headaches.   all other systems are negative except as noted in the HPI and PMH.    Physical Exam Updated Vital Signs BP (!) 172/94 (BP Location: Right Arm)   Pulse 100   Temp 97.9 F (36.6 C)   Resp 16   SpO2 100%  Physical Exam Vitals and nursing note reviewed.  Constitutional:      General: He is not in acute distress.    Appearance: He is well-developed.  HENT:     Head: Normocephalic and atraumatic.     Mouth/Throat:     Pharynx: No oropharyngeal exudate.  Eyes:     Conjunctiva/sclera: Conjunctivae normal.     Pupils: Pupils are equal, round, and reactive to light.  Neck:     Comments: No meningismus. Cardiovascular:     Rate and Rhythm: Normal rate and regular rhythm.     Heart sounds: Normal heart sounds. No murmur heard. Pulmonary:     Effort: Pulmonary effort is normal. No respiratory distress.     Breath sounds: Normal breath sounds.  Abdominal:     Palpations: Abdomen is soft.     Tenderness: There is no abdominal tenderness. There is no guarding or rebound.  Musculoskeletal:        General: No tenderness. Normal range of motion.     Cervical back: Normal range of motion and neck supple.     Comments: Tremors of hands bilaterally.  Intact radial pulses.  Cardinal hand movements intact without difficulty.  No warmth or erythema.  No calf asymmetry or swelling.  No clonus.  Skin:    General: Skin is warm.  Neurological:     Mental Status: He is alert  and oriented to person, place, and time.     Cranial Nerves: No cranial nerve deficit.     Motor: No abnormal muscle tone.     Coordination: Coordination normal.     Comments:  5/5 strength throughout. CN 2-12 intact.Equal grip strength.   Psychiatric:        Behavior: Behavior normal.     ED Results / Procedures / Treatments   Labs (all labs ordered are listed, but only abnormal results are displayed) Labs Reviewed  COMPREHENSIVE METABOLIC PANEL - Abnormal; Notable for the following components:  Result Value   CO2 17 (*)    Glucose, Bld 171 (*)    BUN 85 (*)    Creatinine, Ser 3.54 (*)    AST 14 (*)    GFR, Estimated 16 (*)    Anion gap 16 (*)    All other components within normal limits  CBC WITH DIFFERENTIAL/PLATELET - Abnormal; Notable for the following components:   WBC 13.2 (*)    RBC 3.98 (*)    Hemoglobin 12.1 (*)    HCT 35.7 (*)    RDW 18.0 (*)    Neutro Abs 12.7 (*)    Lymphs Abs 0.2 (*)    Abs Immature Granulocytes 0.09 (*)    All other components within normal limits  POTASSIUM - Abnormal; Notable for the following components:   Potassium 5.2 (*)    All other components within normal limits  URINALYSIS, ROUTINE W REFLEX MICROSCOPIC - Abnormal; Notable for the following components:   Glucose, UA 100 (*)    Hgb urine dipstick MODERATE (*)    Protein, ur 30 (*)    Leukocytes,Ua SMALL (*)    Bacteria, UA RARE (*)    All other components within normal limits  BASIC METABOLIC PANEL - Abnormal; Notable for the following components:   CO2 20 (*)    Glucose, Bld 128 (*)    BUN 80 (*)    Creatinine, Ser 3.21 (*)    Calcium 8.0 (*)    GFR, Estimated 18 (*)    All other components within normal limits  TROPONIN I (HIGH SENSITIVITY) - Abnormal; Notable for the following components:   Troponin I (High Sensitivity) 43 (*)    All other components within normal limits  TROPONIN I (HIGH SENSITIVITY) - Abnormal; Notable for the following components:   Troponin I  (High Sensitivity) 42 (*)    All other components within normal limits  CK  MAGNESIUM  TSH    EKG EKG Interpretation Date/Time:  Sunday November 14 2023 00:02:58 EST Ventricular Rate:  80 PR Interval:  141 QRS Duration:  99 QT Interval:  375 QTC Calculation: 433 R Axis:   46  Text Interpretation: Sinus rhythm No significant change was found Confirmed by Glynn Octave [phone]) on 11/14/2023 12:06:21 AM  Radiology No results found.  Procedures Procedures    Medications Ordered in ED Medications  lactated ringers bolus 1,000 mL (0 mLs Intravenous Stopped 11/14/23 0217)  sodium zirconium cyclosilicate (LOKELMA) packet 10 g (10 g Oral Given 11/14/23 0216)  sodium chloride 0.9 % bolus 500 mL (0 mLs Intravenous Stopped 11/14/23 0249)    ED Course/ Medical Decision Making/ A&P                                 Medical Decision Making Amount and/or Complexity of Data Reviewed Labs: ordered. Decision-making details documented in ED Course. Radiology: ordered and independent interpretation performed. Decision-making details documented in ED Course. ECG/medicine tests: ordered and independent interpretation performed. Decision-making details documented in ED Course.  Risk Prescription drug management.   Bilateral hand cramping intermittent for the past several days.  No fever.  Vital stable, no distress.  Abdomen soft without peritoneal signs.  No active cramping on exam.  Concern for possible electrolyte abnormality including use worsening uremia or hyperkalemia.  Bicarb of 17.  Potassium is normal.  Creatinine 3.5 which is slightly worse than his baseline of around 3  CK is normal.  Magnesium  is normal.  Patient given IV fluids for his elevated kidney function.  EKG without acute changes. Minimally elevated but flat.  Likely secondary to CKD.  Denies chest pain. EKG without acute ischemia  After IV fluids, potassium has normalized to 4.7.  Creatinine improved to  3.2 Patient's symptoms may be due to electrolyte disturbance or uremia. No evidence of acute hyperkalemia requiring dialysis tonight.  Maybe due to uremia.  Creatinine close to baseline.  No further cramps or spasms throughout ED course.  No chest pain or shortness of breath.  Microscopic hematuria at baseline.  Denies gross hematuria.  Troponin minimally elevated but flat.  Low suspicion for ACS.  Message sent to the patient's nephrologist Dr. Ronalee Belts.  Asked him to follow-up patient on Monday.  BUN has increased to 80s and 90s from 60s over the past several months.  He is on no medications to cause hyperkalemia. Calcium slightly low 8.0.  Return to the ED with difficulty breathing, chest pain, unable to urinate or other concerns.       Final Clinical Impression(s) / ED Diagnoses Final diagnoses:  Muscle spasm    Rx / DC Orders ED Discharge Orders     None         Theresea Trautmann, Jeannett Senior, MD 11/14/23 [phone]

## 2023-11-14 NOTE — Discharge Instructions (Addendum)
Call Dr. Ronalee Belts on Monday and ask him if uremia could be contributing to your spasms.  Your kidney function is otherwise at baseline.  Return to the ED with chest pain, shortness of breath, unable to urinate or other concerns.

## 2023-11-14 NOTE — ED Provider Notes (Incomplete)
[place] EMERGENCY DEPARTMENT AT [facility] Provider Note   CSN: [id number] Arrival date & time: 11/13/23  2158     History {Add pertinent medical, surgical, social history, OB history to HPI:1} Chief Complaint  Patient presents with  . Foot Cramping    R  . Spasms    BUE- hands    Brenner Santoli Hefter is a 89 y.o. male.  HPI     Home Medications Prior to Admission medications   Medication Sig Start Date End Date Taking? Authorizing Provider  amLODipine (NORVASC) 10 MG tablet Take 1 tablet (10 mg total) by mouth daily. Patient taking differently: Take 10 mg by mouth at bedtime. 08/23/20   Almon Hercules, MD  aspirin EC 81 MG tablet Take 81 mg by mouth every evening. Swallow whole.    [provider]  ferrous sulfate 325 (65 FE) MG tablet Take 325 mg by mouth daily with supper.    [provider]  finasteride (PROSCAR) 5 MG tablet TAKE ONE TABLET BY MOUTH DAILY Patient taking differently: Take 5 mg by mouth at bedtime. 10/28/20   McKenzie, Mardene Celeste, MD  levothyroxine (SYNTHROID, LEVOTHROID) 50 MCG tablet Take 50 mcg by mouth daily before breakfast. 12/25/15   [provider]  MYRBETRIQ 50 MG TB24 tablet Take 50 mg by mouth at bedtime.    [provider]  pantoprazole (PROTONIX) 20 MG tablet Take 20 mg by mouth at bedtime. 12/20/15   [provider]  predniSONE (DELTASONE) 10 MG tablet Start at 60 mg today, then once a day in the morning starting tomorrow after breakfast for 1 week, followed by 50 mg daily for 1 week, and see me before further changes 10/15/23   [name], MD  psyllium (METAMUCIL) 58.6 % powder Take 0.5 packets by mouth every evening.    [provider]  rosuvastatin (CRESTOR) 5 MG tablet Take 5 mg by mouth at bedtime.    [provider]  senna-docusate (SENOKOT-S) 8.6-50 MG tablet Take 1 tablet by mouth 2 (two) times daily. While taking strong pain meds to prevent constipation 09/04/22    Manny, Delbert Phenix., MD  silodosin (RAPAFLO) 8 MG CAPS capsule TAKE ONE CAPSULE BY MOUTH EVERY NIGHT AT BEDTIME 07/25/20   McKenzie, Mardene Celeste, MD  sodium bicarbonate 650 MG tablet Take 650 mg by mouth See admin instructions. Take 650 mg by mouth at noon and midnight 11/05/22   [provider]  TYLENOL 8 HOUR ARTHRITIS PAIN 650 MG CR tablet Take 1,300 mg by mouth every 8 (eight) hours as needed for pain. Patient not taking: Reported on 10/25/2023    [provider]      Allergies    Macrobid [nitrofurantoin], Ciprofloxacin, Keytruda [pembrolizumab], Tramadol hcl, Cephalexin, and [name] [mitomycin]    Review of Systems   Review of Systems  Physical Exam Updated Vital Signs BP (!) 172/94 (BP Location: Right Arm)   Pulse 100   Temp 97.9 F (36.6 C)   Resp 16   SpO2 100%  Physical Exam  ED Results / Procedures / Treatments   Labs (all labs ordered are listed, but only abnormal results are displayed) Labs Reviewed  COMPREHENSIVE METABOLIC PANEL - Abnormal; Notable for the following components:      Result Value   CO2 17 (*)    Glucose, Bld 171 (*)    BUN 85 (*)    Creatinine, Ser 3.54 (*)    AST 14 (*)    GFR, Estimated  16 (*)    Anion gap 16 (*)    All other components within normal limits  CBC WITH DIFFERENTIAL/PLATELET - Abnormal; Notable for the following components:   WBC 13.2 (*)    RBC 3.98 (*)    Hemoglobin 12.1 (*)    HCT 35.7 (*)    RDW 18.0 (*)    Neutro Abs 12.7 (*)    Lymphs Abs 0.2 (*)    Abs Immature Granulocytes 0.09 (*)    All other components within normal limits  CK  MAGNESIUM  TSH  POTASSIUM  TROPONIN I (HIGH SENSITIVITY)    EKG None  Radiology No results found.  Procedures Procedures  {Document cardiac monitor, telemetry assessment procedure when appropriate:1}  Medications Ordered in ED Medications  lactated ringers bolus 1,000 mL (has no administration in time range)    ED Course/ Medical Decision Making/ A&P    {   Click here for ABCD2, HEART and other calculatorsREFRESH Note before signing :1}                              Medical Decision Making Amount and/or Complexity of Data Reviewed Labs: ordered. ECG/medicine tests: ordered.   ***  {Document critical care time when appropriate:1} {Document review of labs and clinical decision tools ie heart score, Chads2Vasc2 etc:1}  {Document your independent review of radiology images, and any outside records:1} {Document your discussion with family members, caretakers, and with consultants:1} {Document social determinants of health affecting pt's care:1} {Document your decision making why or why not admission, treatments were needed:1} Final Clinical Impression(s) / ED Diagnoses Final diagnoses:  None    Rx / DC Orders ED Discharge Orders     None

## 2023-11-16 ENCOUNTER — Telehealth: Payer: Self-pay

## 2023-11-16 NOTE — Patient Outreach (Signed)
  Care Coordination   Initial Visit Note   11/16/2023 Name: [name] [name] MRN: [id number] DOB: 1932/06/16  [name] [name] is a 89 y.o. year old male who sees [name], MD for primary care. I spoke with  [name] [name] by phone today.  What matters to the patients health and wellness today?  Getting bladder surgery done    Goals Addressed             This Visit's Progress    Getting through Bladder Surgery       Care Coordination Interventions: Evaluation of current treatment plan related to Bladder Cancer and patient's adherence to plan as established by provider Advised patient to follow up with providers as scheduled Discussed plans with patient for ongoing care management follow up and provided patient with direct contact information for care management team Emotional Support Provided  Spoke with patient. He is doing well. Recent hand spasms with ER visit.  He states spasms are better now.  He has follow up appointment with Urology and Oncology coming up.  He reports he is trying to stay hydrated and healthy.  Discussed Blood pressure management.  Weight 169-170 lbs. He verbalized understanding.          SDOH assessments and interventions completed:  Yes  SDOH Interventions Today    Flowsheet Row Most Recent Value  SDOH Interventions   Food Insecurity Interventions Intervention Not Indicated  Housing Interventions Intervention Not Indicated  Transportation Interventions Intervention Not Indicated  Health Literacy Interventions Intervention Not Indicated        Care Coordination Interventions:  Yes, provided   Follow up plan: Follow up call scheduled for January    Encounter Outcome:  Patient Visit Completed   Bary Leriche, RN, MSN RN Care Manager [facility], Population Health Direct Dial: [phone]  Fax: [phone] Website: [email or web address]

## 2023-11-16 NOTE — Patient Instructions (Signed)
Visit Information  Thank you for taking time to visit with me today. Please don't hesitate to contact me if I can be of assistance to you.   Following are the goals we discussed today:   Goals Addressed             This Visit's Progress    Getting through Bladder Surgery       Care Coordination Interventions: Evaluation of current treatment plan related to Bladder Cancer and patient's adherence to plan as established by provider Advised patient to follow up with providers as scheduled Discussed plans with patient for ongoing care management follow up and provided patient with direct contact information for care management team Emotional Support Provided  Spoke with patient. He is doing well. Recent hand spasms with ER visit.  He states spasms are better now.  He has follow up appointment with Urology and Oncology coming up.  He reports he is trying to stay hydrated and healthy.  Discussed Blood pressure management.  Weight 169-170 lbs. He verbalized understanding.          Our next appointment is by telephone on 11/29/23 at 100 pm  Please call the care guide team at [phone] if you need to cancel or reschedule your appointment.   If you are experiencing a Mental Health or Behavioral Health Crisis or need someone to talk to, please call the Suicide and Crisis Lifeline: 988   Patient verbalizes understanding of instructions and care plan provided today and agrees to view in MyChart. Active MyChart status and patient understanding of how to access instructions and care plan via MyChart confirmed with patient.     The patient has been provided with contact information for the care management team and has been advised to call with any health related questions or concerns.   Bary Leriche, RN, MSN RN Care Manager [facility], Population Health Direct Dial: [phone]  Fax: [phone] Website: [email or web address]

## 2023-11-23 DIAGNOSIS — C678 Malignant neoplasm of overlapping sites of bladder: Secondary | ICD-10-CM | POA: Diagnosis not present

## 2023-11-26 ENCOUNTER — Other Ambulatory Visit: Payer: Medicare Other

## 2023-11-26 ENCOUNTER — Ambulatory Visit: Payer: Medicare Other

## 2023-11-29 ENCOUNTER — Inpatient Hospital Stay (HOSPITAL_BASED_OUTPATIENT_CLINIC_OR_DEPARTMENT_OTHER): Payer: Medicare Other

## 2023-11-29 ENCOUNTER — Ambulatory Visit: Payer: Self-pay

## 2023-11-29 ENCOUNTER — Inpatient Hospital Stay: Payer: Medicare Other

## 2023-11-29 ENCOUNTER — Other Ambulatory Visit: Payer: Self-pay

## 2023-11-29 VITALS — BP 135/65 | HR 75 | Temp 97.2°F | Resp 16 | Wt 165.1 lb

## 2023-11-29 DIAGNOSIS — Z9189 Other specified personal risk factors, not elsewhere classified: Secondary | ICD-10-CM

## 2023-11-29 DIAGNOSIS — N179 Acute kidney failure, unspecified: Secondary | ICD-10-CM

## 2023-11-29 DIAGNOSIS — C678 Malignant neoplasm of overlapping sites of bladder: Secondary | ICD-10-CM | POA: Insufficient documentation

## 2023-11-29 DIAGNOSIS — Z79890 Hormone replacement therapy (postmenopausal): Secondary | ICD-10-CM | POA: Diagnosis not present

## 2023-11-29 DIAGNOSIS — E538 Deficiency of other specified B group vitamins: Secondary | ICD-10-CM | POA: Insufficient documentation

## 2023-11-29 DIAGNOSIS — E039 Hypothyroidism, unspecified: Secondary | ICD-10-CM | POA: Diagnosis not present

## 2023-11-29 DIAGNOSIS — Z7982 Long term (current) use of aspirin: Secondary | ICD-10-CM | POA: Insufficient documentation

## 2023-11-29 DIAGNOSIS — Z7952 Long term (current) use of systemic steroids: Secondary | ICD-10-CM | POA: Diagnosis not present

## 2023-11-29 LAB — COMPREHENSIVE METABOLIC PANEL
ALT: 39 U/L (ref 0–44)
AST: 22 U/L (ref 15–41)
Albumin: 3.8 g/dL (ref 3.5–5.0)
Alkaline Phosphatase: 69 U/L (ref 38–126)
Anion gap: 10 (ref 5–15)
BUN: 66 mg/dL — ABNORMAL HIGH (ref 8–23)
CO2: 23 mmol/L (ref 22–32)
Calcium: 10.4 mg/dL — ABNORMAL HIGH (ref 8.9–10.3)
Chloride: 104 mmol/L (ref 98–111)
Creatinine, Ser: 4.55 mg/dL — ABNORMAL HIGH (ref 0.61–1.24)
GFR, Estimated: 12 mL/min — ABNORMAL LOW (ref >60)
Glucose, Bld: 79 mg/dL (ref 70–99)
Potassium: 4.3 mmol/L (ref 3.5–5.1)
Sodium: 137 mmol/L (ref 135–145)
Total Bilirubin: 0.5 mg/dL (ref 0.0–1.2)
Total Protein: 7.4 g/dL (ref 6.5–8.1)

## 2023-11-29 LAB — VITAMIN B12: Vitamin B-12: 978 pg/mL — ABNORMAL HIGH (ref 180–914)

## 2023-11-29 LAB — T4, FREE: Free T4: 0.8 ng/dL (ref 0.61–1.12)

## 2023-11-29 MED ORDER — PREDNISONE 10 MG PO TABS: ORAL_TABLET | ORAL | 0 refills | Status: DC

## 2023-11-29 NOTE — Patient Instructions (Signed)
 Visit Information  Thank you for taking time to visit with me today. Please don't hesitate to contact me if I can be of assistance to you.   Following are the goals we discussed today:   Goals Addressed             This Visit's Progress    Getting through Bladder Surgery       Care Coordination Interventions: Evaluation of current treatment plan related to Bladder Cancer and patient's adherence to plan as established by provider Advised patient to follow up with providers as scheduled Discussed plans with patient for ongoing care management follow up and provided patient with direct contact information for care management team Emotional Support Provided  Spoke with patient. He is doing okay. He reports weight down to 159-160 lbs.  Discussed nutritional supplements,  He did see Dr. Alvaro.  Surgery waiting to be scheduled.  The plan is for bladder and prostate removal and nephrostomy tube to be placed.  Follow up appointment with Oncology coming up.  He continues to try to stay hydrated and healthy.  Most recent blood pressure 127/76.  Reviewed Blood pressure management.  He verbalized understanding.          Our next appointment is by telephone on 12/21/23 at 100 pm  Please call the care guide team at [phone] if you need to cancel or reschedule your appointment.   If you are experiencing a Mental Health or Behavioral Health Crisis or need someone to talk to, please call the Suicide and Crisis Lifeline: 988   Patient verbalizes understanding of instructions and care plan provided today and agrees to view in MyChart. Active MyChart status and patient understanding of how to access instructions and care plan via MyChart confirmed with patient.     The patient has been provided with contact information for the care management team and has been advised to call with any health related questions or concerns.   Ottilia Pippenger J Zadin Lange, RN, MSN RN Care Manager [facility], Population Health Direct Dial: [phone]  Fax: [phone] Website: [email or web address]

## 2023-11-29 NOTE — Assessment & Plan Note (Signed)
Continue B12 500 mcg daily

## 2023-11-29 NOTE — Assessment & Plan Note (Signed)
 stop pembro and plan for surgery

## 2023-11-29 NOTE — Patient Outreach (Signed)
  Care Coordination   Follow Up Visit Note   11/29/2023 Name: [name] [name] MRN: [id number] DOB: 08/21/1932  [name] [name] is a 89 y.o. year old male who sees Verdia Lombard, MD for primary care. I spoke with  [name] [name] by phone today.  What matters to the patients health and wellness today?  Getting through bladder surgery    Goals Addressed             This Visit's Progress    Getting through Bladder Surgery       Care Coordination Interventions: Evaluation of current treatment plan related to Bladder Cancer and patient's adherence to plan as established by provider Advised patient to follow up with providers as scheduled Discussed plans with patient for ongoing care management follow up and provided patient with direct contact information for care management team Emotional Support Provided  Spoke with patient. He is doing okay. He reports weight down to 159-160 lbs.  Discussed nutritional supplements,  He did see Dr. Alvaro.  Surgery waiting to be scheduled.  The plan is for bladder and prostate removal and nephrostomy tube to be placed.  Follow up appointment with Oncology coming up.  He continues to try to stay hydrated and healthy.  Most recent blood pressure 127/76.  Reviewed Blood pressure management.  He verbalized understanding.          SDOH assessments and interventions completed:  Yes     Care Coordination Interventions:  Yes, provided   Follow up plan: Follow up call scheduled for 12/21/23    Encounter Outcome:  Patient Visit Completed   Dagny Fiorentino J Lynanne Delgreco, RN, MSN RN Care Manager [facility], Population Health Direct Dial: [phone]  Fax: [phone] Website: [email or web address]

## 2023-11-29 NOTE — Assessment & Plan Note (Addendum)
 Labs today showed worsening renal function. Start prednisone 60 mg daily x 1 week, then 50 mg daily x 1 week.  Repeat lab next week. Report to ED if unable to urinate, nausea, profound fatigue Increase fluid to 60-70 oz per day

## 2023-11-29 NOTE — Progress Notes (Signed)
 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   Patient received 1 dose of pembrolizumab  and developed elevated creatinine up to 4.07. Baseline mostly below 3.4. Treatment stopped. Repeat UA without signs of infectious process.  AKI on CKD improved on prednisone .  Today, renal function is worsening with Cr at 4.55 despite adequate hydration. No signs of UTI.  Will repeat labs next week while weaning off steroid to monitor for improvement of toxicity.  AKI (acute kidney injury) (HCC) Labs today showed worsening renal function. Start prednisone  60 mg daily x 1 week, then 50 mg daily x 1 week.  Repeat lab next week. Report to ED if unable to urinate, nausea, profound fatigue Increase fluid to 60-70 oz per day  Malignant neoplasm of urinary bladder (HCC) stop pembro and plan for surgery  Low serum vitamin B12 Continue B12 500 mcg daily     The patient understands the plans discussed today and is in agreement with them.  He knows to contact our office if he develops concerns prior to his next appointment.  Pauletta JAYSON Chihuahua, MD  [facility] - A DEPT OF [facility]. [facility] [address] [place] [address] Dept: [phone] Dept Fax: [phone]   No orders of the defined types were placed in this encounter.     CHIEF COMPLAINT:  CC: UC  INTERVAL HISTORY:  Sly is here today for repeat clinical assessment.  Report of not feeling well for 2 nights. No  nausea or vomiting. Report some insomnia. He feels normal today.  Report his hip gets trouble walking sometimes. Some good days and worse.   Report of muscle weakness about the same started with mitomycin  over the past year. He thinks Keytruda  made it worse. He is urinating well, appears normal.   I have reviewed the past medical history, past surgical history, social history and family history with the patient and they are unchanged from previous note.  ALLERGIES:  is allergic to macrobid [nitrofurantoin], ciprofloxacin , keytruda  [pembrolizumab ], tramadol hcl, cephalexin, and jelmyto  [mitomycin ].  MEDICATIONS:  Current Outpatient Medications  Medication Sig Dispense Refill   amLODipine  (NORVASC ) 10 MG tablet Take 1 tablet (10 mg total) by mouth daily. (Patient taking differently: Take 10 mg by mouth at bedtime.) 90 tablet 1   aspirin  EC 81 MG tablet Take 81 mg by mouth every evening. Swallow whole.     ferrous sulfate  325 (65 FE) MG tablet Take 325 mg by mouth daily with supper.     finasteride  (PROSCAR ) 5 MG tablet TAKE ONE TABLET BY MOUTH DAILY (Patient taking differently: Take 5 mg by mouth at bedtime.) 90 tablet 3   levothyroxine  (SYNTHROID , LEVOTHROID) 50 MCG tablet Take 50 mcg by mouth daily before breakfast.     MYRBETRIQ  50 MG TB24 tablet Take 50 mg by mouth at bedtime.     pantoprazole  (PROTONIX ) 20 MG tablet Take 20 mg by mouth at bedtime.     predniSONE  (DELTASONE ) 10 MG tablet Start at 60 mg today, then once a day in the  morning starting tomorrow after breakfast for 1 week, followed by 50 mg daily for 1 week, and see me before further changes 140 tablet 0   psyllium (METAMUCIL) 58.6 % powder Take 0.5 packets by mouth every evening.     rosuvastatin  (CRESTOR ) 5 MG tablet Take 5 mg by mouth at bedtime.     senna-docusate (SENOKOT-S) 8.6-50 MG tablet Take 1 tablet by mouth 2 (two) times daily. While taking strong pain meds to prevent constipation 10 tablet 0   silodosin  (RAPAFLO ) 8 MG  CAPS capsule TAKE ONE CAPSULE BY MOUTH EVERY NIGHT AT BEDTIME 90 capsule 3   sodium bicarbonate  650 MG tablet Take 650 mg by mouth See admin instructions. Take 650 mg by mouth at noon and midnight     TYLENOL  8 HOUR ARTHRITIS PAIN 650 MG CR tablet Take 1,300 mg by mouth every 8 (eight) hours as needed for pain.     No current facility-administered medications for this visit.    HISTORY OF PRESENT ILLNESS:   Oncology History Overview Note  2000 BPH with elevated PSA. Report biopsy was benign.   2011 to now with multiple TURBT   2021 TaG3 bladder cancer. TURBT + BCG x 6  2023 TaG1 bladder + left renal pelvis cancer, failed [name]  >>  08/2022 Left nephroureterectomy + RPLND. 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  06/2023 cysto w/ TURBT multifocal papillary bladder tumor recurrence (dome, left trigone, bladder neck, prostate urethral tissue) ==> TaG3 with negative muscle  Path: A. BLADDER TUMOR, TURBT:  Noninvasive high grade papillary urothelial carcinoma with inverted  growth pattern  muscularis propria (detrusor muscle) is present and not involved by  carcinoma   B. BLADDER TUMOR, NECK, BIOPSY:  Noninvasive high-grade papillary urothelial carcinoma with inverted  growth pattern  Submucosa, prostatic glands and muscularis is present and not involved  by carcinoma   C. BLADDER TUMOR, BASE, BIOPSY:  Noninvasive high grade papillary urothelial carcinoma  Muscularis propria is present and not involved by carcinoma       Malignant neoplasm of urinary bladder (HCC)  09/15/2019 Initial Diagnosis   Malignant  neoplasm of urinary bladder (HCC)   08/05/2023 Cancer Staging   Staging form: Urinary Bladder, AJCC 8th Edition - Clinical: Stage 0a (rcTa, cN0, cM0) - Signed by Tina Pauletta BROCKS, MD on 08/05/2023 Stage prefix: Recurrence WHO/ISUP grade (low/high): High Grade Histologic grading system: 2 grade system   09/16/2023 - 09/16/2023 Chemotherapy   Patient is on Treatment Plan : BLADDER Pembrolizumab  (200) q21d     10/01/2023 Pathology Results   Presented with hematuria and underwent TURBT  A. BLADDER TUMOR, TURBT:  Noninvasive high grade papillary urothelial carcinoma  Muscularis propria (detrusor muscle) is not present        REVIEW OF SYSTEMS:   All relevant systems were reviewed with the patient and are negative.   VITALS:  Blood pressure 135/65, pulse 75, temperature (!) 97.2 F (36.2 C), resp. rate 16, weight 165 lb 1.6 oz (74.9 kg), SpO2 100%.  Wt Readings from Last 3 Encounters:  11/29/23 165 lb 1.6 oz (74.9 kg)  11/04/23 176 lb 6.4 oz (80 kg)  10/25/23  173 lb 12.8 oz (78.8 kg)    Body mass index is 25.1 kg/m.  Performance status (ECOG): 2 - Symptomatic, <50% confined to bed  PHYSICAL EXAM:   GENERAL:alert, no distress and comfortable SKIN: skin color normal, no rashes  EYES: normal, sclera clear LUNGS: clear to auscultation with normal breathing effort.  No wheeze or rales HEART: regular rate & rhythm and no murmurs and no lower extremity edema ABDOMEN: abdomen soft, non-tender and nondistended Musculoskeletal: no edema NEURO: alert, fluent speech\  LABORATORY DATA:  I have reviewed the data as listed    Component Value Date/Time   NA 137 11/29/2023 1546   K 4.3 11/29/2023 1546   CL 104 11/29/2023 1546   CO2 23 11/29/2023 1546   GLUCOSE 79 11/29/2023 1546   BUN 66 (H) 11/29/2023 1546   CREATININE 4.55 (H) 11/29/2023 1546   CREATININE 2.99 (H) 11/04/2023 1454   CALCIUM  10.4 (H) 11/29/2023 1546   PROT 7.4 11/29/2023 1546   ALBUMIN  3.8 11/29/2023 1546   AST  22 11/29/2023 1546   AST 18 10/25/2023 0935   ALT 39 11/29/2023 1546   ALT 29 10/25/2023 0935   ALKPHOS 69 11/29/2023 1546   BILITOT 0.5 11/29/2023 1546   BILITOT 0.6 10/25/2023 0935   GFRNONAA 12 (L) 11/29/2023 1546   GFRNONAA 19 (L) 11/04/2023 1454   GFRAA 35 (L) 08/23/2020 0524    No results found for: SPEP, UPEP  Lab Results  Component Value Date   WBC 13.2 (H) 11/13/2023   NEUTROABS 12.7 (H) 11/13/2023   HGB 12.1 (L) 11/13/2023   HCT 35.7 (L) 11/13/2023   MCV 89.7 11/13/2023   PLT 224 11/13/2023      Chemistry      Component Value Date/Time   NA 137 11/29/2023 1546   K 4.3 11/29/2023 1546   CL 104 11/29/2023 1546   CO2 23 11/29/2023 1546   BUN 66 (H) 11/29/2023 1546   CREATININE 4.55 (H) 11/29/2023 1546   CREATININE 2.99 (H) 11/04/2023 1454      Component Value Date/Time   CALCIUM  10.4 (H) 11/29/2023 1546   ALKPHOS 69 11/29/2023 1546   AST 22 11/29/2023 1546   AST 18 10/25/2023 0935   ALT 39 11/29/2023 1546   ALT 29 10/25/2023 0935   BILITOT 0.5 11/29/2023 1546   BILITOT 0.6 10/25/2023 0935       RADIOGRAPHIC STUDIES: I have personally reviewed the radiological images as listed and agreed with the findings in the report. No results found.

## 2023-11-29 NOTE — Patient Instructions (Signed)
 Start prednisone 60 mg today and continue every morning tomorrow for 7 days, then decrease to 50 mg daily. Repeat lab with me next Thursday for follow up.

## 2023-11-30 LAB — TSH: TSH: 2.9 u[IU]/mL (ref 0.350–4.500)

## 2023-12-02 ENCOUNTER — Telehealth: Payer: Self-pay

## 2023-12-02 NOTE — Telephone Encounter (Signed)
 Rescheduled appointments per provider on call. Patient is aware of the changes made and will be mailed an appointment reminder.

## 2023-12-03 NOTE — Assessment & Plan Note (Addendum)
 Labs today showed improved renal function. Will decrease prednisone  40 mg daily starting Thursday x 1 week, then 30 mg daily x 1 week.  Repeat lab in 2 weeks before visit Report to ED if unable to urinate, nausea, profound fatigue Continue fluid to 60-70 oz per day

## 2023-12-03 NOTE — Progress Notes (Signed)
 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   Patient received 1 dose of pembrolizumab  and developed elevated creatinine up to 4.07. Baseline mostly below 3.4. Treatment stopped. Repeat UA without signs of infectious process.  AKI on CKD improved on prednisone .   He developed AKI after weaning off steroids. We restarted prednisone  at 60 mg daily x 1 weeks. Cr improved to 3.6 from 4.55 today. Will continue wean off steroid.  AKI (acute kidney injury) (HCC) Labs today showed improved renal function. Will decrease prednisone  40 mg daily starting Thursday x 1 week, then 30 mg daily x 1 week.  Repeat lab in 2 weeks before visit Report to ED if unable to urinate, nausea, profound fatigue Continue fluid to 60-70 oz per day  Malignant neoplasm of urinary bladder (HCC) stop pembro and plan for surgery Recommend proceed with surgical resection  See me in 2 weeks after blood draw for BMP.  The patient understands the plans discussed today and is in agreement with them.  He knows to contact our office if he develops concerns prior to his next appointment.  Pauletta JAYSON Chihuahua, MD  [facility] [facility] [address] [place] [address] Dept: [phone] Dept Fax: [phone]   Orders Placed This Encounter  Procedures   Basic Metabolic Panel - Cancer Center Only    Standing Status:   Future    Expected Date:   12/20/2023    Expiration Date:    12/05/2024      CHIEF COMPLAINT:  CC: bladder cancer  INTERVAL HISTORY:  Jodi is here today for follow up. He is feeling well. He decreased prednisone  to 50 mg since Thursday.  No difficulty urinating. Report change in urination to rusty color and small clot. No dysuria. No fever, chills, low back pain. [facility] Nephrology decreased amlodipine  dosage to keep BP about 150. No swelling and no bruising or bleeding.  ALLERGIES:  is allergic to macrobid [nitrofurantoin], ciprofloxacin , keytruda  [pembrolizumab ], tramadol hcl, cephalexin, and jelmyto  [mitomycin ].  MEDICATIONS:  Current Outpatient Medications  Medication Sig Dispense Refill   amLODipine  (NORVASC ) 10 MG tablet Take 1 tablet (10 mg total) by mouth daily. (Patient taking differently: Take 10 mg by mouth at bedtime.) 90 tablet 1   aspirin  EC 81 MG tablet Take 81 mg by mouth every evening. Swallow whole.     ferrous sulfate  325 (65 FE) MG tablet Take 325 mg by mouth daily with supper.     finasteride  (PROSCAR ) 5 MG tablet TAKE ONE TABLET BY MOUTH DAILY (Patient taking differently: Take 5 mg by mouth at bedtime.) 90 tablet 3   levothyroxine  (SYNTHROID , LEVOTHROID) 50 MCG tablet Take 50 mcg by mouth daily before breakfast.     MYRBETRIQ  50 MG TB24 tablet Take 50 mg by mouth at bedtime.     pantoprazole  (PROTONIX ) 20 MG tablet Take 20 mg by mouth at bedtime.     predniSONE  (DELTASONE ) 10 MG tablet Start at 60 mg today, then once a day in the morning  starting tomorrow after breakfast for 1 week, followed by 50 mg daily for 1 week, and see me before further changes 140 tablet 0   psyllium (METAMUCIL) 58.6 % powder Take 0.5 packets by mouth every evening.     rosuvastatin  (CRESTOR ) 5 MG tablet Take 5 mg by mouth at bedtime.     senna-docusate (SENOKOT-S) 8.6-50 MG tablet Take 1 tablet by mouth 2 (two) times daily. While taking strong pain meds to prevent constipation 10 tablet 0   silodosin  (RAPAFLO ) 8 MG CAPS capsule TAKE ONE CAPSULE BY  MOUTH EVERY NIGHT AT BEDTIME 90 capsule 3   sodium bicarbonate  650 MG tablet Take 650 mg by mouth See admin instructions. Take 650 mg by mouth at noon and midnight     TYLENOL  8 HOUR ARTHRITIS PAIN 650 MG CR tablet Take 1,300 mg by mouth every 8 (eight) hours as needed for pain.     No current facility-administered medications for this visit.    HISTORY OF PRESENT ILLNESS:   Past Medical History:  Diagnosis Date   Anemia    Arthritis    lower back bulging disc, hips, knees, thumbs, shoulder   Asthma    as a pre teen   Bladder cancer (HCC) UROLOGIST-  DR MCKENZIE   S/P TURBT 08-16-2017   Bladder wall hemorrhage 09/28/2023   BPH (benign prostatic hyperplasia)    CKD (chronic kidney disease), stage III (HCC)    STAGE 3  B CKD per lov 10-16-2020 dr mateo dolan [facility] on chart   Dyspnea    Fatigue    MIDDLE OF DAY ON OCCASION   Frequency of urination    GERD (gastroesophageal reflux disease)    on protonix    Hematuria    Hematuria    History of colon polyps    HLD (hyperlipidemia)    HOH (hard of hearing)    slightly hoh right worse thanleft   HTN (hypertension)    Hypothyroidism    Incontinence of urine    weras pads all the time   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   Right Buccal Cheek   Pre-diabetes    Recurrent bladder papillary carcinoma (HCC) hx 2012 and 2014-- urologist- dr sherrilee   s/p  TURBT 08-16-2017  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   Renal mass 09/02/2022   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 10/02/2023   Sleep apnea    uses cpap set on 20 /4   Trigger finger of left hand 07/04/2021   last few months per pt   UTI (urinary tract infection) 08/20/2020   Weakness of extremity    left knee   Wears glasses     REVIEW OF SYSTEMS:   All relevant systems were reviewed with the patient and are negative.   VITALS:  Blood pressure 135/69, pulse 84, temperature (!) 97.5 F (36.4 C), temperature  source Temporal, resp. rate 19, weight 172 lb (78 kg), SpO2 100%.  Wt Readings from Last 3 Encounters:  12/06/23 172 lb (78 kg)  11/29/23 165 lb 1.6 oz (74.9 kg)  11/04/23 176 lb 6.4 oz (80 kg)    Body mass index is 26.15 kg/m.  Performance status (ECOG): 2 - Symptomatic, <50% confined to bed  PHYSICAL EXAM:   GENERAL: alert, no distress and comfortable SKIN: skin color normal, no jaundice LUNGS: normal breathing effort.   Musculoskeletal: bilateral lowe extremity edema  LABORATORY DATA:  I have reviewed the data as listed  Component Value Date/Time   NA 138 12/06/2023 1544   K 4.4 12/06/2023 1544   CL 107 12/06/2023 1544   CO2 19 (L) 12/06/2023 1544   GLUCOSE 157 (H) 12/06/2023 1544   BUN 90 (H) 12/06/2023 1544   CREATININE 3.61 (H) 12/06/2023 1544   CALCIUM  9.5 12/06/2023 1544   PROT 7.4 11/29/2023 1546   ALBUMIN  3.8 11/29/2023 1546   AST 22 11/29/2023 1546   AST 18 10/25/2023 0935   ALT 39 11/29/2023 1546   ALT 29 10/25/2023 0935   ALKPHOS 69 11/29/2023 1546   BILITOT 0.5 11/29/2023 1546   BILITOT 0.6 10/25/2023 0935   GFRNONAA 15 (L) 12/06/2023 1544   GFRAA 35 (L) 08/23/2020 0524    No results found for: SPEP, UPEP  Lab Results  Component Value Date   WBC 13.2 (H) 11/13/2023   NEUTROABS 12.7 (H) 11/13/2023   HGB 12.1 (L) 11/13/2023   HCT 35.7 (L) 11/13/2023   MCV 89.7 11/13/2023   PLT 224 11/13/2023      Chemistry      Component Value Date/Time   NA 138 12/06/2023 1544   K 4.4 12/06/2023 1544   CL 107 12/06/2023 1544   CO2 19 (L) 12/06/2023 1544   BUN 90 (H) 12/06/2023 1544   CREATININE 3.61 (H) 12/06/2023 1544      Component Value Date/Time   CALCIUM  9.5 12/06/2023 1544   ALKPHOS 69 11/29/2023 1546   AST 22 11/29/2023 1546   AST 18 10/25/2023 0935   ALT 39 11/29/2023 1546   ALT 29 10/25/2023 0935   BILITOT 0.5 11/29/2023 1546   BILITOT 0.6 10/25/2023 0935       RADIOGRAPHIC STUDIES: I have personally reviewed the radiological  images as listed and agreed with the findings in the report. No results found.

## 2023-12-06 ENCOUNTER — Inpatient Hospital Stay: Payer: Medicare Other

## 2023-12-06 VITALS — BP 135/69 | HR 84 | Temp 97.5°F | Resp 19 | Wt 172.0 lb

## 2023-12-06 DIAGNOSIS — Z7952 Long term (current) use of systemic steroids: Secondary | ICD-10-CM | POA: Diagnosis not present

## 2023-12-06 DIAGNOSIS — E872 Acidosis, unspecified: Secondary | ICD-10-CM | POA: Diagnosis not present

## 2023-12-06 DIAGNOSIS — C679 Malignant neoplasm of bladder, unspecified: Secondary | ICD-10-CM | POA: Diagnosis not present

## 2023-12-06 DIAGNOSIS — N179 Acute kidney failure, unspecified: Secondary | ICD-10-CM | POA: Diagnosis not present

## 2023-12-06 DIAGNOSIS — D509 Iron deficiency anemia, unspecified: Secondary | ICD-10-CM | POA: Diagnosis not present

## 2023-12-06 DIAGNOSIS — R319 Hematuria, unspecified: Secondary | ICD-10-CM | POA: Diagnosis not present

## 2023-12-06 DIAGNOSIS — Z7982 Long term (current) use of aspirin: Secondary | ICD-10-CM | POA: Diagnosis not present

## 2023-12-06 DIAGNOSIS — Z905 Acquired absence of kidney: Secondary | ICD-10-CM | POA: Diagnosis not present

## 2023-12-06 DIAGNOSIS — N2581 Secondary hyperparathyroidism of renal origin: Secondary | ICD-10-CM | POA: Diagnosis not present

## 2023-12-06 DIAGNOSIS — E039 Hypothyroidism, unspecified: Secondary | ICD-10-CM | POA: Diagnosis not present

## 2023-12-06 DIAGNOSIS — I129 Hypertensive chronic kidney disease with stage 1 through stage 4 chronic kidney disease, or unspecified chronic kidney disease: Secondary | ICD-10-CM | POA: Diagnosis not present

## 2023-12-06 DIAGNOSIS — E538 Deficiency of other specified B group vitamins: Secondary | ICD-10-CM | POA: Diagnosis not present

## 2023-12-06 DIAGNOSIS — C678 Malignant neoplasm of overlapping sites of bladder: Secondary | ICD-10-CM | POA: Diagnosis not present

## 2023-12-06 DIAGNOSIS — E875 Hyperkalemia: Secondary | ICD-10-CM | POA: Diagnosis not present

## 2023-12-06 DIAGNOSIS — N184 Chronic kidney disease, stage 4 (severe): Secondary | ICD-10-CM | POA: Diagnosis not present

## 2023-12-06 LAB — BASIC METABOLIC PANEL - CANCER CENTER ONLY
Anion gap: 12 (ref 5–15)
BUN: 90 mg/dL — ABNORMAL HIGH (ref 8–23)
CO2: 19 mmol/L — ABNORMAL LOW (ref 22–32)
Calcium: 9.5 mg/dL (ref 8.9–10.3)
Chloride: 107 mmol/L (ref 98–111)
Creatinine: 3.61 mg/dL — ABNORMAL HIGH (ref 0.61–1.24)
GFR, Estimated: 15 mL/min — ABNORMAL LOW (ref >60)
Glucose, Bld: 157 mg/dL — ABNORMAL HIGH (ref 70–99)
Potassium: 4.4 mmol/L (ref 3.5–5.1)
Sodium: 138 mmol/L (ref 135–145)

## 2023-12-06 NOTE — Assessment & Plan Note (Signed)
 stop pembro and plan for surgery Recommend proceed with surgical resection

## 2023-12-08 ENCOUNTER — Telehealth: Payer: Self-pay | Admitting: *Deleted

## 2023-12-08 NOTE — Telephone Encounter (Signed)
 Faxed office note to Dr Jacobo Masters at [facility]

## 2023-12-09 ENCOUNTER — Ambulatory Visit: Payer: Medicare Other

## 2023-12-09 ENCOUNTER — Other Ambulatory Visit: Payer: Medicare Other

## 2023-12-15 DIAGNOSIS — H52203 Unspecified astigmatism, bilateral: Secondary | ICD-10-CM | POA: Diagnosis not present

## 2023-12-15 DIAGNOSIS — H11002 Unspecified pterygium of left eye: Secondary | ICD-10-CM | POA: Diagnosis not present

## 2023-12-15 DIAGNOSIS — H524 Presbyopia: Secondary | ICD-10-CM | POA: Diagnosis not present

## 2023-12-15 DIAGNOSIS — H04123 Dry eye syndrome of bilateral lacrimal glands: Secondary | ICD-10-CM | POA: Diagnosis not present

## 2023-12-15 DIAGNOSIS — H0100A Unspecified blepharitis right eye, upper and lower eyelids: Secondary | ICD-10-CM | POA: Diagnosis not present

## 2023-12-17 DIAGNOSIS — C44319 Basal cell carcinoma of skin of other parts of face: Secondary | ICD-10-CM | POA: Diagnosis not present

## 2023-12-20 ENCOUNTER — Other Ambulatory Visit: Payer: Self-pay

## 2023-12-20 ENCOUNTER — Inpatient Hospital Stay: Payer: Medicare Other

## 2023-12-20 VITALS — BP 153/75 | HR 82 | Temp 97.5°F | Resp 16 | Wt 171.9 lb

## 2023-12-20 DIAGNOSIS — D508 Other iron deficiency anemias: Secondary | ICD-10-CM

## 2023-12-20 DIAGNOSIS — E538 Deficiency of other specified B group vitamins: Secondary | ICD-10-CM | POA: Diagnosis not present

## 2023-12-20 DIAGNOSIS — N179 Acute kidney failure, unspecified: Secondary | ICD-10-CM | POA: Diagnosis not present

## 2023-12-20 DIAGNOSIS — Z8639 Personal history of other endocrine, nutritional and metabolic disease: Secondary | ICD-10-CM | POA: Diagnosis not present

## 2023-12-20 DIAGNOSIS — E039 Hypothyroidism, unspecified: Secondary | ICD-10-CM | POA: Diagnosis not present

## 2023-12-20 DIAGNOSIS — C678 Malignant neoplasm of overlapping sites of bladder: Secondary | ICD-10-CM | POA: Diagnosis not present

## 2023-12-20 DIAGNOSIS — Z7952 Long term (current) use of systemic steroids: Secondary | ICD-10-CM | POA: Diagnosis not present

## 2023-12-20 DIAGNOSIS — Z7982 Long term (current) use of aspirin: Secondary | ICD-10-CM | POA: Diagnosis not present

## 2023-12-20 DIAGNOSIS — I129 Hypertensive chronic kidney disease with stage 1 through stage 4 chronic kidney disease, or unspecified chronic kidney disease: Secondary | ICD-10-CM | POA: Diagnosis not present

## 2023-12-20 LAB — BASIC METABOLIC PANEL - CANCER CENTER ONLY
Anion gap: 8 (ref 5–15)
BUN: 81 mg/dL — ABNORMAL HIGH (ref 8–23)
CO2: 22 mmol/L (ref 22–32)
Calcium: 8.7 mg/dL — ABNORMAL LOW (ref 8.9–10.3)
Chloride: 111 mmol/L (ref 98–111)
Creatinine: 3.34 mg/dL — ABNORMAL HIGH (ref 0.61–1.24)
GFR, Estimated: 17 mL/min — ABNORMAL LOW (ref >60)
Glucose, Bld: 112 mg/dL — ABNORMAL HIGH (ref 70–99)
Potassium: 4.5 mmol/L (ref 3.5–5.1)
Sodium: 141 mmol/L (ref 135–145)

## 2023-12-20 NOTE — Assessment & Plan Note (Signed)
Labs today showed improved renal function. Will continue prednisone 30 mg daily starting Saturday x 1 week, then 20 mg daily x 1 week.  Repeat lab in 2 weeks before visit Report to ED if unable to urinate, nausea, profound fatigue Continue fluid to 60-70 oz per day

## 2023-12-20 NOTE — Assessment & Plan Note (Signed)
Repeat cbc and ferritin next week

## 2023-12-20 NOTE — Progress Notes (Unsigned)
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    Patient received 1 dose of pembrolizumab and developed elevated creatinine up to 4.07. Baseline mostly below 3.4. Treatment stopped. Repeat UA without signs of infectious process.  AKI on CKD improved on prednisone.   He now has recurrent AKI after weaning off steroids. We restarted prednisone at 60 mg daily x 1 weeks. Cr improved to 3.6 from 4.55 today. Cr improved again today. Will continue wean off steroid.  AKI (acute kidney injury) (HCC) Labs today showed improved renal function. Will continue prednisone 30 mg daily starting Saturday x 1 week, then 20 mg daily x 1 week.  Repeat lab in 2 weeks before visit Report to ED if unable to urinate, nausea, profound fatigue Continue fluid to 60-70 oz per day  History of iron deficiency Repeat cbc and ferritin next draw  Malignant neoplasm of urinary bladder (HCC) stop pembro and plan for surgery Recommend proceed with surgical resection    The patient understands the plans discussed today and is in agreement with them.  He knows to contact our office if he develops concerns prior to his next appointment.  [name], MD  [facility] - [facility] DEPT [facility] [address] [place] [address] Dept: [phone] Dept Fax: [phone]   Orders Placed This Encounter  Procedures   CBC with Differential (Cancer Center Only)    Standing Status:   Future     Expiration Date:   12/19/2024   Ferritin    Standing Status:   Future    Expiration Date:   12/19/2024   Basic Metabolic Panel - Cancer Center Only    Standing Status:   Future    Expiration Date:   12/19/2024      CHIEF COMPLAINT:  CC: bladder cancer  INTERVAL HISTORY:  [name] [name] is here today for repeat clinical assessment. He denies fevers or chills. His appetite is good.  No trouble urinating. No bloody in urine for a few weeks. Report on prednisone 30 mg daily since Saturday. Some swelling in the right leg worse than left. Report of heaviness on the right. No difficulty breathing or short of breath. He is not taking aspirin.  I have reviewed the past medical history, past surgical history, social history and family history with the patient and they are unchanged from previous note.  ALLERGIES:  is allergic to macrobid [nitrofurantoin], ciprofloxacin, keytruda [pembrolizumab], tramadol hcl, cephalexin, and jelmyto [mitomycin].  MEDICATIONS:  Current Outpatient Medications  Medication Sig Dispense Refill   amLODipine (NORVASC) 10 MG tablet Take 1 tablet (10 mg total) by mouth daily. (Patient taking differently: Take 10 mg by mouth at bedtime.) 90 tablet 1   ferrous sulfate 325 (65 FE) MG tablet Take 325 mg by mouth daily with supper.     finasteride (PROSCAR) 5 MG tablet TAKE ONE TABLET BY MOUTH DAILY (Patient taking differently: Take 5 mg by mouth at bedtime.) 90 tablet 3   levothyroxine (SYNTHROID, LEVOTHROID) 50 MCG tablet Take 50 mcg  by mouth daily before breakfast.     MYRBETRIQ 50 MG TB24 tablet Take 50 mg by mouth at bedtime.     pantoprazole (PROTONIX) 20 MG tablet Take 20 mg by mouth at bedtime.     predniSONE (DELTASONE) 10 MG tablet Start at 60 mg today, then once a day in the morning starting tomorrow after breakfast for 1 week, followed by 50 mg daily for 1 week, and see me before further changes 140 tablet 0   psyllium (METAMUCIL) 58.6 % powder Take 0.5 packets by  mouth every evening.     rosuvastatin (CRESTOR) 5 MG tablet Take 5 mg by mouth at bedtime.     senna-docusate (SENOKOT-S) 8.6-50 MG tablet Take 1 tablet by mouth 2 (two) times daily. While taking strong pain meds to prevent constipation 10 tablet 0   silodosin (RAPAFLO) 8 MG CAPS capsule TAKE ONE CAPSULE BY MOUTH EVERY NIGHT AT BEDTIME 90 capsule 3   sodium bicarbonate 650 MG tablet Take 650 mg by mouth See admin instructions. Take 650 mg by mouth at noon and midnight     TYLENOL 8 HOUR ARTHRITIS PAIN 650 MG CR tablet Take 1,300 mg by mouth every 8 (eight) hours as needed for pain.     No current facility-administered medications for this visit.    HISTORY OF PRESENT ILLNESS:   Oncology History Overview Note  2000 BPH with elevated PSA. Report biopsy was benign.   2011 to now with multiple TURBT   2021 TaG3 bladder cancer. TURBT + BCG x 6  2023 TaG1 bladder + left renal pelvis cancer, failed [name] >>  08/2022 Left nephroureterectomy + RPLND. 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  06/2023 cysto w/ TURBT multifocal papillary bladder tumor recurrence (dome, left trigone, bladder neck, prostate urethral tissue) ==> TaG3 with negative muscle  Path: A. BLADDER TUMOR, TURBT:  Noninvasive high grade papillary urothelial carcinoma with inverted  growth pattern  muscularis propria (detrusor muscle) is present and not involved by  carcinoma   B. BLADDER TUMOR, NECK, BIOPSY:  Noninvasive high-grade papillary urothelial carcinoma with inverted  growth pattern  Submucosa, prostatic glands and muscularis is present and not  involved  by carcinoma   C. BLADDER TUMOR, BASE, BIOPSY:  Noninvasive high grade papillary urothelial carcinoma  Muscularis propria is present and not involved by carcinoma       Malignant neoplasm of urinary bladder (HCC)  09/15/2019 Initial Diagnosis   Malignant neoplasm of urinary bladder (HCC)   08/05/2023 Cancer Staging   Staging form: Urinary Bladder, AJCC 8th Edition - Clinical: Stage 0a (rcTa, cN0, cM0) - Signed by [name], MD on 08/05/2023 Stage prefix: Recurrence WHO/ISUP grade (low/high): High Grade Histologic grading system: 2 grade system   09/16/2023 - 09/16/2023 Chemotherapy   Patient is on Treatment Plan : BLADDER Pembrolizumab (200) q21d     10/01/2023 Pathology Results   Presented with hematuria and underwent TURBT  A. BLADDER TUMOR, TURBT:  Noninvasive high grade papillary urothelial carcinoma  Muscularis propria (detrusor muscle) is not present        REVIEW OF SYSTEMS:   All relevant systems were reviewed with  the patient and are negative.   VITALS:  Blood pressure (!) 153/75, pulse 82, temperature (!) 97.5 F (36.4 C), temperature source Temporal, resp. rate 16, weight 171 lb 14.4 oz (78 kg), SpO2 100%.  Wt Readings from Last 3 Encounters:  12/21/23 171 lb 15.3 oz (78 kg)  12/20/23 171 lb 14.4 oz (78 kg)  12/06/23 172 lb (78 kg)    Body mass index is 26.14 kg/m.  Performance status (ECOG): 2 - Symptomatic, <50% confined to bed  PHYSICAL EXAM:   GENERAL:alert, no distress and comfortable SKIN: skin color normal, no rashes  EYES: normal, sclera clear LUNGS: clear to auscultation with normal breathing effort.  No wheeze or rales HEART: regular rate & rhythm and no murmurs and no lower extremity edema ABDOMEN: abdomen soft, non-tender and nondistended Musculoskeletal: no edema  LABORATORY DATA:  I have reviewed the data as listed    Component Value Date/Time   NA 139 12/21/2023 1451   K 4.5 12/21/2023 1451   CL 109 12/21/2023  1451   CO2 19 (L) 12/21/2023 1451   GLUCOSE 99 12/21/2023 1451   BUN 71 (H) 12/21/2023 1451   CREATININE 2.81 (H) 12/21/2023 1451   CREATININE 3.34 (H) 12/20/2023 1539   CALCIUM 8.6 (L) 12/21/2023 1451   PROT 7.4 11/29/2023 1546   ALBUMIN 3.8 11/29/2023 1546   AST 22 11/29/2023 1546   AST 18 10/25/2023 0935   ALT 39 11/29/2023 1546   ALT 29 10/25/2023 0935   ALKPHOS 69 11/29/2023 1546   BILITOT 0.5 11/29/2023 1546   BILITOT 0.6 10/25/2023 0935   GFRNONAA 21 (L) 12/21/2023 1451   GFRNONAA 17 (L) 12/20/2023 1539   GFRAA 35 (L) 08/23/2020 0524    No results found for: "SPEP", "UPEP"  Lab Results  Component Value Date   WBC 11.4 (H) 12/21/2023   NEUTROABS 12.7 (H) 11/13/2023   HGB 12.8 (L) 12/21/2023   HCT 39.0 12/21/2023   MCV 91.3 12/21/2023   PLT 142 (L) 12/21/2023      Chemistry      Component Value Date/Time   NA 139 12/21/2023 1451   K 4.5 12/21/2023 1451   CL 109 12/21/2023 1451   CO2 19 (L) 12/21/2023 1451   BUN 71 (H) 12/21/2023 1451   CREATININE 2.81 (H) 12/21/2023 1451   CREATININE 3.34 (H) 12/20/2023 1539      Component Value Date/Time   CALCIUM 8.6 (L) 12/21/2023 1451   ALKPHOS 69 11/29/2023 1546   AST 22 11/29/2023 1546   AST 18 10/25/2023 0935   ALT 39 11/29/2023 1546   ALT 29 10/25/2023 0935   BILITOT 0.5 11/29/2023 1546   BILITOT 0.6 10/25/2023 0935       RADIOGRAPHIC STUDIES: I have personally reviewed the radiological images as listed and agreed with the findings in the report. No results found.

## 2023-12-20 NOTE — Assessment & Plan Note (Signed)
stop pembro and plan for surgery Recommend proceed with surgical resection

## 2023-12-21 ENCOUNTER — Encounter (HOSPITAL_COMMUNITY): Payer: Self-pay

## 2023-12-21 ENCOUNTER — Ambulatory Visit: Payer: Self-pay

## 2023-12-21 ENCOUNTER — Emergency Department (HOSPITAL_COMMUNITY)
Admission: EM | Admit: 2023-12-21 | Discharge: 2023-12-21 | Disposition: A | Discharge status: Home / Self Care | Payer: Medicare Other | Attending: Emergency Medicine | Admitting: Emergency Medicine

## 2023-12-21 DIAGNOSIS — C679 Malignant neoplasm of bladder, unspecified: Secondary | ICD-10-CM | POA: Diagnosis not present

## 2023-12-21 DIAGNOSIS — R531 Weakness: Secondary | ICD-10-CM | POA: Diagnosis not present

## 2023-12-21 DIAGNOSIS — R509 Fever, unspecified: Secondary | ICD-10-CM | POA: Diagnosis not present

## 2023-12-21 DIAGNOSIS — N390 Urinary tract infection, site not specified: Secondary | ICD-10-CM | POA: Diagnosis not present

## 2023-12-21 DIAGNOSIS — Z20822 Contact with and (suspected) exposure to covid-19: Secondary | ICD-10-CM | POA: Insufficient documentation

## 2023-12-21 DIAGNOSIS — Z79899 Other long term (current) drug therapy: Secondary | ICD-10-CM | POA: Diagnosis not present

## 2023-12-21 DIAGNOSIS — R319 Hematuria, unspecified: Secondary | ICD-10-CM | POA: Insufficient documentation

## 2023-12-21 DIAGNOSIS — N189 Chronic kidney disease, unspecified: Secondary | ICD-10-CM | POA: Diagnosis not present

## 2023-12-21 DIAGNOSIS — Z743 Need for continuous supervision: Secondary | ICD-10-CM | POA: Diagnosis not present

## 2023-12-21 DIAGNOSIS — Z905 Acquired absence of kidney: Secondary | ICD-10-CM | POA: Diagnosis not present

## 2023-12-21 DIAGNOSIS — E860 Dehydration: Secondary | ICD-10-CM | POA: Insufficient documentation

## 2023-12-21 DIAGNOSIS — R6889 Other general symptoms and signs: Secondary | ICD-10-CM | POA: Diagnosis not present

## 2023-12-21 LAB — RESP PANEL BY RT-PCR (RSV, FLU A&B, COVID)  RVPGX2
Influenza A by PCR: NEGATIVE
Influenza B by PCR: NEGATIVE
Resp Syncytial Virus by PCR: NEGATIVE
SARS Coronavirus 2 by RT PCR: NEGATIVE

## 2023-12-21 LAB — CBC
HCT: 39 % (ref 39.0–52.0)
Hemoglobin: 12.8 g/dL — ABNORMAL LOW (ref 13.0–17.0)
MCH: 30 pg (ref 26.0–34.0)
MCHC: 32.8 g/dL (ref 30.0–36.0)
MCV: 91.3 fL (ref 80.0–100.0)
Platelets: 142 10*3/uL — ABNORMAL LOW (ref 150–400)
RBC: 4.27 MIL/uL (ref 4.22–5.81)
RDW: 18.5 % — ABNORMAL HIGH (ref 11.5–15.5)
WBC: 11.4 10*3/uL — ABNORMAL HIGH (ref 4.0–10.5)
nRBC: 0 % (ref 0.0–0.2)

## 2023-12-21 LAB — URINALYSIS, ROUTINE W REFLEX MICROSCOPIC
Bacteria, UA: NONE SEEN
Bilirubin Urine: NEGATIVE
Glucose, UA: NEGATIVE mg/dL
Ketones, ur: NEGATIVE mg/dL
Nitrite: NEGATIVE
Protein, ur: 100 mg/dL — AB
RBC / HPF: >50 RBC/hpf (ref 0–5)
Specific Gravity, Urine: 1.013 (ref 1.005–1.030)
pH: 5 (ref 5.0–8.0)

## 2023-12-21 LAB — BASIC METABOLIC PANEL
Anion gap: 11 (ref 5–15)
BUN: 71 mg/dL — ABNORMAL HIGH (ref 8–23)
CO2: 19 mmol/L — ABNORMAL LOW (ref 22–32)
Calcium: 8.6 mg/dL — ABNORMAL LOW (ref 8.9–10.3)
Chloride: 109 mmol/L (ref 98–111)
Creatinine, Ser: 2.81 mg/dL — ABNORMAL HIGH (ref 0.61–1.24)
GFR, Estimated: 21 mL/min — ABNORMAL LOW (ref >60)
Glucose, Bld: 99 mg/dL (ref 70–99)
Potassium: 4.5 mmol/L (ref 3.5–5.1)
Sodium: 139 mmol/L (ref 135–145)

## 2023-12-21 LAB — CBG MONITORING, ED: Glucose-Capillary: 76 mg/dL (ref 70–99)

## 2023-12-21 LAB — I-STAT CG4 LACTIC ACID, ED: Lactic Acid, Venous: 0.9 mmol/L (ref 0.5–1.9)

## 2023-12-21 MED ORDER — PIPERACILLIN-TAZOBACTAM 3.375 G IVPB 30 MIN
3.3750 g | Freq: Once | INTRAVENOUS | Status: AC
  Administered 2023-12-21: 3.375 g via INTRAVENOUS
  Filled 2023-12-21: qty 50

## 2023-12-21 MED ORDER — CEFPODOXIME PROXETIL 200 MG PO TABS: 200.0000 mg | ORAL_TABLET | Freq: Two times a day (BID) | ORAL | 0 refills | Status: AC

## 2023-12-21 MED ORDER — SODIUM CHLORIDE 0.9 % IV BOLUS
1000.0000 mL | Freq: Once | INTRAVENOUS | Status: AC
  Administered 2023-12-21: 1000 mL via INTRAVENOUS

## 2023-12-21 NOTE — Discharge Instructions (Signed)
You were seen in the emergency department for generalized weakness and urinary symptoms It does appear as though you have a urinary tract infection We will send the urine for culture to see what bacteria grows We have called in a prescription for antibiotics for you to pick up from your pharmacy begin taking as directed Is importantly follow-up with your primary doctor within the next week for reevaluation Be sure you are keeping well-hydrated at home as your feelings of weakness are most likely attributed to dehydration Return to the emergency department for severe weakness, inability urinate, uncontrolled fevers or any other concerns

## 2023-12-21 NOTE — Patient Instructions (Signed)
Visit Information  Thank you for taking time to visit with me today. Please don't hesitate to contact me if I can be of assistance to you.   Following are the goals we discussed today:   Goals Addressed             This Visit's Progress    Getting through Bladder Surgery       Care Coordination Interventions: Evaluation of current treatment plan related to Bladder Cancer and patient's adherence to plan as established by provider Advised patient to follow up with providers as scheduled Discussed plans with patient for ongoing care management follow up and provided patient with direct contact information for care management team Emotional Support Provided  Spoke with patient and son. Patient today is not doing well.  He is having some weakness and fever today. Advised that patient needs to seek medical care now.          Our next appointment is by telephone on 01/11/24 at 100 pm  Please call the care guide team at [phone] if you need to cancel or reschedule your appointment.   If you are experiencing a Mental Health or Behavioral Health Crisis or need someone to talk to, please call the Suicide and Crisis Lifeline: 988   Patient verbalizes understanding of instructions and care plan provided today and agrees to view in MyChart. Active MyChart status and patient understanding of how to access instructions and care plan via MyChart confirmed with patient.     The patient has been provided with contact information for the care management team and has been advised to call with any health related questions or concerns.   Bary Leriche RN, MSN [facility], [facility] RN Care Manager Direct Dial: [phone]  Fax: [phone] Website: [email or web address]

## 2023-12-21 NOTE — ED Provider Notes (Signed)
[place] EMERGENCY DEPARTMENT AT [facility] Provider Note   CSN: [id number] Arrival date & time: 12/21/23  1344     History  Chief Complaint  Patient presents with   Weakness    MORT [name] [name] is a 89 y.o. male.  With history of stage IV bladder cancer, CKD status post left nephrectomy who presents to the ED for fevers.  24 hours of fevers, shaking chills and generalized weakness.  Patient is typically very independent and ambulatory but has been unable to get up out of bed under his own power today.  He does note difficulty voiding to completion with decreased urine volume.  No bladder or abdominal pain or flank pain.  Denies cough congestion and respiratory symptoms.  No recent urologic instrumentation in the last 30 days.  Was previously on Keytruda to treat stage IV bladder cancer but this was paused due to neutropenia.  No current chemotherapy but he is on prednisone currently 30 mg daily   Weakness      Home Medications Prior to Admission medications   Medication Sig Start Date End Date Taking? Authorizing Provider  cefpodoxime (VANTIN) 200 MG tablet Take 1 tablet (200 mg total) by mouth 2 (two) times daily for 10 days. 12/21/23 12/31/23 Yes Royanne Foots, DO  amLODipine (NORVASC) 10 MG tablet Take 1 tablet (10 mg total) by mouth daily. Patient taking differently: Take 10 mg by mouth at bedtime. 08/23/20   Almon Hercules, MD  ferrous sulfate 325 (65 FE) MG tablet Take 325 mg by mouth daily with supper.    [provider]  finasteride (PROSCAR) 5 MG tablet TAKE ONE TABLET BY MOUTH DAILY Patient taking differently: Take 5 mg by mouth at bedtime. 10/28/20   McKenzie, Mardene Celeste, MD  levothyroxine (SYNTHROID, LEVOTHROID) 50 MCG tablet Take 50 mcg by mouth daily before breakfast. 12/25/15   [provider]  MYRBETRIQ 50 MG TB24 tablet Take 50 mg by mouth at bedtime.    [provider]  pantoprazole (PROTONIX) 20 MG tablet Take 20 mg by mouth  at bedtime. 12/20/15   [provider]  predniSONE (DELTASONE) 10 MG tablet Start at 60 mg today, then once a day in the morning starting tomorrow after breakfast for 1 week, followed by 50 mg daily for 1 week, and see me before further changes 11/29/23   [name], MD  psyllium (METAMUCIL) 58.6 % powder Take 0.5 packets by mouth every evening.    [provider]  rosuvastatin (CRESTOR) 5 MG tablet Take 5 mg by mouth at bedtime.    [provider]  senna-docusate (SENOKOT-S) 8.6-50 MG tablet Take 1 tablet by mouth 2 (two) times daily. While taking strong pain meds to prevent constipation 09/04/22   Manny, Delbert Phenix., MD  silodosin (RAPAFLO) 8 MG CAPS capsule TAKE ONE CAPSULE BY MOUTH EVERY NIGHT AT BEDTIME 07/25/20   McKenzie, Mardene Celeste, MD  sodium bicarbonate 650 MG tablet Take 650 mg by mouth See admin instructions. Take 650 mg by mouth at noon and midnight 11/05/22   [provider]  TYLENOL 8 HOUR ARTHRITIS PAIN 650 MG CR tablet Take 1,300 mg by mouth every 8 (eight) hours as needed for pain.    [provider]      Allergies    Macrobid [nitrofurantoin], Ciprofloxacin, Keytruda [pembrolizumab], Tramadol hcl, Cephalexin, and [name] [mitomycin]    Review of Systems   Review of Systems  Neurological:  Positive for weakness.  Physical Exam Updated Vital Signs BP 127/69   Pulse 89   Temp 98.1 F (36.7 C)   Resp 16   Ht 5\' 8"  (1.727 m)   Wt 78 kg   SpO2 98%   BMI 26.15 kg/m  Physical Exam Vitals and nursing note reviewed.  HENT:     Head: Normocephalic and atraumatic.  Eyes:     Pupils: Pupils are equal, round, and reactive to light.  Cardiovascular:     Rate and Rhythm: Normal rate and regular rhythm.  Pulmonary:     Effort: Pulmonary effort is normal.     Breath sounds: Normal breath sounds.  Abdominal:     Palpations: Abdomen is soft.     Tenderness: There is no abdominal tenderness.  Skin:    General: Skin is warm  and dry.  Neurological:     Mental Status: He is alert.  Psychiatric:        Mood and Affect: Mood normal.     ED Results / Procedures / Treatments   Labs (all labs ordered are listed, but only abnormal results are displayed) Labs Reviewed  BASIC METABOLIC PANEL - Abnormal; Notable for the following components:      Result Value   CO2 19 (*)    BUN 71 (*)    Creatinine, Ser 2.81 (*)    Calcium 8.6 (*)    GFR, Estimated 21 (*)    All other components within normal limits  CBC - Abnormal; Notable for the following components:   WBC 11.4 (*)    Hemoglobin 12.8 (*)    RDW 18.5 (*)    Platelets 142 (*)    All other components within normal limits  URINALYSIS, ROUTINE W REFLEX MICROSCOPIC - Abnormal; Notable for the following components:   Hgb urine dipstick LARGE (*)    Protein, ur 100 (*)    Leukocytes,Ua TRACE (*)    All other components within normal limits  RESP PANEL BY RT-PCR (RSV, FLU A&B, COVID)  RVPGX2  CULTURE, BLOOD (ROUTINE X 2)  CULTURE, BLOOD (ROUTINE X 2)  URINE CULTURE  CBG MONITORING, ED  I-STAT CG4 LACTIC ACID, ED  I-STAT CG4 LACTIC ACID, ED    EKG None  Radiology No results found.  Procedures Procedures    Medications Ordered in ED Medications  sodium chloride 0.9 % bolus 1,000 mL (0 mLs Intravenous Stopped 12/21/23 2047)  piperacillin-tazobactam (ZOSYN) IVPB 3.375 g (0 g Intravenous Stopped 12/21/23 2047)  sodium chloride 0.9 % bolus 1,000 mL (1,000 mLs Intravenous New Bag/Given 12/21/23 2111)    ED Course/ Medical Decision Making/ A&P Clinical Course as of 12/21/23 2240  Tue Dec 21, 2023  2235 Patient reports feeling significantly better after 2 L IV fluid.  He is able to get up under his own power and walk and urinate without difficulty.  Given his rapid improvement and laboratory workup do not feel he warrants inpatient admission to treat his UTI.  Will prescribe cefpodoxime.  He does have a history of rash with Keflex but there is a low risk  for cross-reactivity between cefpodoxime and Keflex.  Will prescribe outpatient antibiotics and instructed to follow-up with his PCP.  Return precautions were discussed with him in detail. [MP]    Clinical Course User Index [MP] Royanne Foots, DO                                 Medical  Decision Making 89 year old male with history as above presenting for fevers chills generalized weakness over the last 24 hours.  Reports urinary symptoms.  No respiratory symptoms.  Fevers at home measured by son.  Was initially tachycardic here.  Benign physical exam.  Considering his history most likely etiology would be urinary tract infection but will obtain infectious workup including labs chest x-ray viral respiratory swab cultures.  Given that he is febrile and significantly weak he will likely require admission.  Hemodynamically stable but will give 1 L of IV fluids and continue to monitor hemodynamic status.  Patient was able to urinate without great difficulty here, low suspicion for acute urinary retention  Amount and/or Complexity of Data Reviewed Labs: ordered.  Risk Prescription drug management.           Final Clinical Impression(s) / ED Diagnoses Final diagnoses:  Dehydration  Urinary tract infection with hematuria, site unspecified    Rx / DC Orders ED Discharge Orders          Ordered    cefpodoxime (VANTIN) 200 MG tablet  2 times daily        12/21/23 2239              Royanne Foots, DO 12/21/23 2240

## 2023-12-21 NOTE — Patient Outreach (Signed)
  Care Coordination   Follow Up Visit Note   12/21/2023 Name: [name] [name] MRN: [id number] DOB: 04-29-1932  [name] [name] is a 89 y.o. year old male who sees [name], MD for primary care. I spoke with  [name] [name] by phone today.  What matters to the patients health and wellness today?  Weakness and fever    Goals Addressed             This Visit's Progress    Getting through Bladder Surgery       Care Coordination Interventions: Evaluation of current treatment plan related to Bladder Cancer and patient's adherence to plan as established by provider Advised patient to follow up with providers as scheduled Discussed plans with patient for ongoing care management follow up and provided patient with direct contact information for care management team Emotional Support Provided  Spoke with patient and son. Patient today is not doing well.  He is having some weakness and fever today. Advised that patient needs to seek medical care now.          SDOH assessments and interventions completed:  Yes     Care Coordination Interventions:  Yes, provided   Follow up plan: Follow up call scheduled for February    Encounter Outcome:  Patient Visit Completed   Bary Leriche RN, MSN [facility], [facility] RN Care Manager Direct Dial: [phone]  Fax: [phone] Website: [email or web address]

## 2023-12-21 NOTE — ED Triage Notes (Addendum)
BIBA from home c/o decreased urinary output and weakness x1 day. Denies abd pain Patient uses walker at baseline and A&O x4.  Stg 4 bladder cancer.  Pt currently on prednisone 30mg .

## 2023-12-22 LAB — URINE CULTURE: Culture: NO GROWTH

## 2023-12-26 LAB — CULTURE, BLOOD (ROUTINE X 2)
Culture: NO GROWTH
Culture: NO GROWTH

## 2023-12-29 DIAGNOSIS — I10 Essential (primary) hypertension: Secondary | ICD-10-CM | POA: Diagnosis not present

## 2023-12-29 DIAGNOSIS — I129 Hypertensive chronic kidney disease with stage 1 through stage 4 chronic kidney disease, or unspecified chronic kidney disease: Secondary | ICD-10-CM | POA: Diagnosis not present

## 2023-12-29 DIAGNOSIS — C662 Malignant neoplasm of left ureter: Secondary | ICD-10-CM | POA: Diagnosis not present

## 2023-12-29 DIAGNOSIS — I700 Atherosclerosis of aorta: Secondary | ICD-10-CM | POA: Diagnosis not present

## 2023-12-29 DIAGNOSIS — E860 Dehydration: Secondary | ICD-10-CM | POA: Diagnosis not present

## 2023-12-29 DIAGNOSIS — N184 Chronic kidney disease, stage 4 (severe): Secondary | ICD-10-CM | POA: Diagnosis not present

## 2023-12-29 DIAGNOSIS — E039 Hypothyroidism, unspecified: Secondary | ICD-10-CM | POA: Diagnosis not present

## 2023-12-29 DIAGNOSIS — E782 Mixed hyperlipidemia: Secondary | ICD-10-CM | POA: Diagnosis not present

## 2023-12-29 DIAGNOSIS — D649 Anemia, unspecified: Secondary | ICD-10-CM | POA: Diagnosis not present

## 2023-12-29 DIAGNOSIS — R5383 Other fatigue: Secondary | ICD-10-CM | POA: Diagnosis not present

## 2023-12-30 ENCOUNTER — Other Ambulatory Visit: Payer: Self-pay | Admitting: Urology

## 2023-12-31 ENCOUNTER — Other Ambulatory Visit: Payer: Self-pay | Admitting: Urology

## 2023-12-31 DIAGNOSIS — C4401 Basal cell carcinoma of skin of lip: Secondary | ICD-10-CM | POA: Diagnosis not present

## 2024-01-02 NOTE — Progress Notes (Signed)
 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    Patient received 1 dose of pembrolizumab  and developed elevated creatinine up to 4.07. Baseline mostly below 3.4. Treatment stopped. Repeat UA without signs of infectious process.  AKI on CKD improved on prednisone .   He now has recurrent AKI after weaning off steroids. Worsening kidney function again. Cr worse today after a week on 20 mg daily.   AKI (acute kidney injury) (HCC) Will take 30 mg today and continue prednisone  30 mg daily and decrease to 20 mg daily next Monday  Stop PPI Repeat lab on next Fri with visit afterwards Report to ED if unable to urinate, nausea, profound fatigue Continue fluid to 60-70 oz per day  Low serum vitamin B12 Improved. Continue B12 500 mcg daily   Malignant neoplasm of urinary bladder (HCC) stop pembro due to AKI on CKD and plan for surgery Recommend proceed with surgical resection  Benign prostatic hyperplasia without lower urinary tract symptoms Continue silodosin . Refilled     The patient understands the plans discussed today and is in agreement with them.  He knows to contact our office if he develops concerns prior to his next appointment.  Lowanda Ruddy, MD  [facility] - A DEPT OF [facility] [address] [place] [address] Dept: [phone] Dept Fax: [phone]   Orders Placed This Encounter  Procedures   Basic Metabolic Panel - Cancer Center  Only    Standing Status:   Future    Expiration Date:   01/02/2025      CHIEF COMPLAINT:  CC: recurrent bladder cancer  Current Treatment:  stopped due to AKI  INTERVAL HISTORY:  [name] [name] is here today for repeat clinical assessment. He denies fevers or chills. No trouble urinating and still drink a lot of fluid. Report a small clots but not this morning. No dysuria.  He denies pelvic pain. His appetite is good. No coughing.  He is on prednisone  at 10 mg started today  He takes pantoprazole  but has not acid reflux or heartburn.   No worsening weakness and walking well most of the time.  I have reviewed the past medical history, past surgical history, social history and family history with the patient and they are unchanged from previous note.  ALLERGIES:  is allergic to macrobid [nitrofurantoin], ciprofloxacin , keytruda  [pembrolizumab ], tramadol hcl, cephalexin, and jelmyto  [mitomycin ].  MEDICATIONS:  Current Outpatient Medications  Medication Sig Dispense Refill   amLODipine  (NORVASC ) 10 MG tablet Take 1 tablet (10 mg total) by mouth daily. (Patient taking differently: Take 10 mg by mouth at bedtime.) 90 tablet 1   ferrous sulfate  325 (65 FE) MG tablet Take 325 mg by mouth daily with supper.     finasteride  (PROSCAR ) 5 MG tablet TAKE ONE TABLET BY MOUTH DAILY (Patient taking differently: Take 5 mg by mouth at bedtime.) 90 tablet 3   levothyroxine  (SYNTHROID , LEVOTHROID) 50 MCG tablet Take 50 mcg by mouth daily before breakfast.     MYRBETRIQ  50 MG TB24 tablet Take  50 mg by mouth at bedtime.     pantoprazole  (PROTONIX ) 20 MG tablet Take 20 mg by mouth at bedtime.     predniSONE  (DELTASONE ) 10 MG tablet Start at 30 mg today, continue daily for 1 week, followed by 20 mg daily until follow up 50 tablet 0   psyllium (METAMUCIL) 58.6 % powder Take 0.5 packets by mouth every evening.     rosuvastatin  (CRESTOR ) 5 MG tablet Take 5 mg by mouth at bedtime.     senna-docusate (SENOKOT-S) 8.6-50  MG tablet Take 1 tablet by mouth 2 (two) times daily. While taking strong pain meds to prevent constipation 10 tablet 0   silodosin  (RAPAFLO ) 8 MG CAPS capsule Take 1 capsule (8 mg total) by mouth at bedtime. 90 capsule 3   sodium bicarbonate  650 MG tablet Take 650 mg by mouth See admin instructions. Take 650 mg by mouth at noon and midnight     TYLENOL  8 HOUR ARTHRITIS PAIN 650 MG CR tablet Take 1,300 mg by mouth every 8 (eight) hours as needed for pain.     No current facility-administered medications for this visit.    HISTORY OF PRESENT ILLNESS:   Oncology History Overview Note  2000 BPH with elevated PSA. Report biopsy was benign.   2011 to now with multiple TURBT   2021 TaG3 bladder cancer. TURBT + BCG x 6  2023 TaG1 bladder + left renal pelvis cancer, failed [name]  >>  08/2022 Left nephroureterectomy + RPLND. 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  06/2023 cysto w/ TURBT multifocal papillary bladder tumor recurrence (dome, left trigone, bladder neck, prostate urethral tissue) ==> TaG3 with negative muscle  Path: A. BLADDER TUMOR, TURBT:  Noninvasive high grade papillary urothelial carcinoma with inverted  growth pattern  muscularis propria (detrusor muscle) is present and not involved by  carcinoma   B. BLADDER TUMOR, NECK, BIOPSY:  Noninvasive high-grade papillary urothelial carcinoma with inverted  growth pattern  Submucosa, prostatic glands and muscularis is present and not involved  by carcinoma   C. BLADDER TUMOR, BASE, BIOPSY:  Noninvasive high grade papillary urothelial carcinoma  Muscularis  propria is present and not involved by carcinoma       Malignant neoplasm of urinary bladder (HCC)  09/15/2019 Initial Diagnosis   Malignant neoplasm of urinary bladder (HCC)   08/05/2023 Cancer Staging   Staging form: Urinary Bladder, AJCC 8th Edition - Clinical: Stage 0a (rcTa, cN0, cM0) - Signed by Lowanda Ruddy, MD on 08/05/2023 Stage prefix: Recurrence WHO/ISUP grade (low/high): High Grade Histologic grading system: 2 grade system   09/16/2023 - 09/16/2023 Chemotherapy   Patient is on Treatment Plan : BLADDER Pembrolizumab  (200) q21d     10/01/2023 Pathology Results   Presented with hematuria and underwent TURBT  A. BLADDER TUMOR, TURBT:  Noninvasive high grade papillary urothelial carcinoma  Muscularis propria (detrusor muscle) is not present        REVIEW OF SYSTEMS:   All relevant systems were reviewed with the patient and are negative.   VITALS:  Blood pressure (!) 153/68, pulse 75, temperature (!) 97.5 F (36.4 C), temperature source Temporal, resp. rate 16, weight 171  lb 14.4 oz (78 kg), SpO2 100%.  Wt Readings from Last 3 Encounters:  01/03/24 171 lb 14.4 oz (78 kg)  12/21/23 171 lb 15.3 oz (78 kg)  12/20/23 171 lb 14.4 oz (78 kg)    Body mass index is 26.14 kg/m.  Performance status (ECOG): 1 - Symptomatic but completely ambulatory  PHYSICAL EXAM:   GENERAL:alert, no distress and comfortable SKIN: skin color normal, no rashes  LUNGS: clear to auscultation with normal breathing effort.  No wheeze or rales HEART: regular rate & rhythm and no murmurs and no lower extremity edema ABDOMEN: abdomen soft, non-tender and nondistended Musculoskeletal: no edema  LABORATORY DATA:  I have reviewed the data as listed    Component Value Date/Time   NA 141 01/03/2024 0810   K 4.7 01/03/2024 0810   CL 108 01/03/2024 0810   CO2 24 01/03/2024 0810   GLUCOSE 81 01/03/2024 0810   BUN 77 (H) 01/03/2024 0810   CREATININE 3.45 (H) 01/03/2024 0810   CALCIUM  9.1  01/03/2024 0810   PROT 7.4 11/29/2023 1546   ALBUMIN  3.8 11/29/2023 1546   AST 22 11/29/2023 1546   AST 18 10/25/2023 0935   ALT 39 11/29/2023 1546   ALT 29 10/25/2023 0935   ALKPHOS 69 11/29/2023 1546   BILITOT 0.5 11/29/2023 1546   BILITOT 0.6 10/25/2023 0935   GFRNONAA 16 (L) 01/03/2024 0810   GFRAA 35 (L) 08/23/2020 0524    No results found for: "SPEP", "UPEP"  Lab Results  Component Value Date   WBC 7.8 01/03/2024   NEUTROABS 6.5 01/03/2024   HGB 11.4 (L) 01/03/2024   HCT 35.6 (L) 01/03/2024   MCV 91.3 01/03/2024   PLT 259 01/03/2024      Chemistry      Component Value Date/Time   NA 141 01/03/2024 0810   K 4.7 01/03/2024 0810   CL 108 01/03/2024 0810   CO2 24 01/03/2024 0810   BUN 77 (H) 01/03/2024 0810   CREATININE 3.45 (H) 01/03/2024 0810      Component Value Date/Time   CALCIUM  9.1 01/03/2024 0810   ALKPHOS 69 11/29/2023 1546   AST 22 11/29/2023 1546   AST 18 10/25/2023 0935   ALT 39 11/29/2023 1546   ALT 29 10/25/2023 0935   BILITOT 0.5 11/29/2023 1546   BILITOT 0.6 10/25/2023 0935       RADIOGRAPHIC STUDIES: I have personally reviewed the radiological images as listed and agreed with the findings in the report. No results found.

## 2024-01-02 NOTE — Assessment & Plan Note (Addendum)
 Will take 30 mg today and continue prednisone  30 mg daily and decrease to 20 mg daily next Monday  Stop PPI Repeat lab on next Fri with visit afterwards Report to ED if unable to urinate, nausea, profound fatigue Continue fluid to 60-70 oz per day

## 2024-01-02 NOTE — Assessment & Plan Note (Signed)
 stop pembro due to AKI on CKD and plan for surgery Recommend proceed with surgical resection

## 2024-01-02 NOTE — Assessment & Plan Note (Signed)
 Improved. Continue B12 500 mcg daily

## 2024-01-03 ENCOUNTER — Inpatient Hospital Stay: Payer: Medicare Other

## 2024-01-03 VITALS — BP 153/68 | HR 75 | Temp 97.5°F | Resp 16 | Wt 171.9 lb

## 2024-01-03 DIAGNOSIS — Z7952 Long term (current) use of systemic steroids: Secondary | ICD-10-CM | POA: Diagnosis not present

## 2024-01-03 DIAGNOSIS — C678 Malignant neoplasm of overlapping sites of bladder: Secondary | ICD-10-CM | POA: Diagnosis not present

## 2024-01-03 DIAGNOSIS — N400 Enlarged prostate without lower urinary tract symptoms: Secondary | ICD-10-CM

## 2024-01-03 DIAGNOSIS — N179 Acute kidney failure, unspecified: Secondary | ICD-10-CM | POA: Diagnosis not present

## 2024-01-03 DIAGNOSIS — E538 Deficiency of other specified B group vitamins: Secondary | ICD-10-CM

## 2024-01-03 DIAGNOSIS — Z79899 Other long term (current) drug therapy: Secondary | ICD-10-CM | POA: Insufficient documentation

## 2024-01-03 DIAGNOSIS — D508 Other iron deficiency anemias: Secondary | ICD-10-CM

## 2024-01-03 DIAGNOSIS — C679 Malignant neoplasm of bladder, unspecified: Secondary | ICD-10-CM | POA: Diagnosis not present

## 2024-01-03 LAB — CBC WITH DIFFERENTIAL (CANCER CENTER ONLY)
Abs Immature Granulocytes: 0.21 10*3/uL — ABNORMAL HIGH (ref 0.00–0.07)
Basophils Absolute: 0.1 10*3/uL (ref 0.0–0.1)
Basophils Relative: 1 %
Eosinophils Absolute: 0.2 10*3/uL (ref 0.0–0.5)
Eosinophils Relative: 2 %
HCT: 35.6 % — ABNORMAL LOW (ref 39.0–52.0)
Hemoglobin: 11.4 g/dL — ABNORMAL LOW (ref 13.0–17.0)
Immature Granulocytes: 3 %
Lymphocytes Relative: 4 %
Lymphs Abs: 0.3 10*3/uL — ABNORMAL LOW (ref 0.7–4.0)
MCH: 29.2 pg (ref 26.0–34.0)
MCHC: 32 g/dL (ref 30.0–36.0)
MCV: 91.3 fL (ref 80.0–100.0)
Monocytes Absolute: 0.5 10*3/uL (ref 0.1–1.0)
Monocytes Relative: 7 %
Neutro Abs: 6.5 10*3/uL (ref 1.7–7.7)
Neutrophils Relative %: 83 %
Platelet Count: 259 10*3/uL (ref 150–400)
RBC: 3.9 MIL/uL — ABNORMAL LOW (ref 4.22–5.81)
RDW: 18.1 % — ABNORMAL HIGH (ref 11.5–15.5)
WBC Count: 7.8 10*3/uL (ref 4.0–10.5)
nRBC: 0 % (ref 0.0–0.2)

## 2024-01-03 LAB — BASIC METABOLIC PANEL - CANCER CENTER ONLY
Anion gap: 9 (ref 5–15)
BUN: 77 mg/dL — ABNORMAL HIGH (ref 8–23)
CO2: 24 mmol/L (ref 22–32)
Calcium: 9.1 mg/dL (ref 8.9–10.3)
Chloride: 108 mmol/L (ref 98–111)
Creatinine: 3.45 mg/dL — ABNORMAL HIGH (ref 0.61–1.24)
GFR, Estimated: 16 mL/min — ABNORMAL LOW (ref >60)
Glucose, Bld: 81 mg/dL (ref 70–99)
Potassium: 4.7 mmol/L (ref 3.5–5.1)
Sodium: 141 mmol/L (ref 135–145)

## 2024-01-03 LAB — FERRITIN: Ferritin: 82 ng/mL (ref 24–336)

## 2024-01-03 MED ORDER — SILODOSIN 8 MG PO CAPS: 8.0000 mg | ORAL_CAPSULE | Freq: Every day | ORAL | 3 refills | Status: DC

## 2024-01-03 MED ORDER — PREDNISONE 10 MG PO TABS: ORAL_TABLET | ORAL | 0 refills | Status: DC

## 2024-01-03 NOTE — Assessment & Plan Note (Signed)
 Continue silodosin . Refilled

## 2024-01-11 ENCOUNTER — Ambulatory Visit: Payer: Self-pay

## 2024-01-11 NOTE — Patient Instructions (Signed)
 Visit Information  Thank you for taking time to visit with me today. Please don't hesitate to contact me if I can be of assistance to you.   Following are the goals we discussed today:   Goals Addressed             This Visit's Progress    Getting through Bladder Surgery       Care Coordination Interventions: Evaluation of current treatment plan related to Bladder Cancer and patient's adherence to plan as established by provider Advised patient to follow up with providers as scheduled Discussed plans with patient for ongoing care management follow up and provided patient with direct contact information for care management team Emotional Support Provided  Spoke with patient, he reports is doing good right now.  He has pending surgery.  Follow up with Dr. [name] this week.   Explained to him nephrostomy tubes and how they work. He is grateful for the education.  Advised to call CM if he has further questions.  He verbalized understanding.           Our next appointment is by telephone on 02/15/24 at 100 pm  Please call the care guide team at [phone] if you need to cancel or reschedule your appointment.   If you are experiencing a Mental Health or Behavioral Health Crisis or need someone to talk to, please call the Suicide and Crisis Lifeline: 988   Patient verbalizes understanding of instructions and care plan provided today and agrees to view in MyChart. Active MyChart status and patient understanding of how to access instructions and care plan via MyChart confirmed with patient.     The patient has been provided with contact information for the care management team and has been advised to call with any health related questions or concerns.   Bary Leriche RN, MSN [facility], [facility] Health RN Care Manager Direct Dial: [phone]  Fax: [phone] Website: [email or web address]

## 2024-01-11 NOTE — Patient Outreach (Signed)
  Care Coordination   Follow Up Visit Note   01/11/2024 Name: LONNELL CHAPUT MRN: [id number] DOB: [birth year]  Karoline Caldwell Rack is a 89 y.o. year old male who sees [name], MD for primary care. I spoke with  Karoline Caldwell Smola by phone today.  What matters to the patients health and wellness today?  Getting through pending surgery    Goals Addressed             This Visit's Progress    Getting through Bladder Surgery       Care Coordination Interventions: Evaluation of current treatment plan related to Bladder Cancer and patient's adherence to plan as established by provider Advised patient to follow up with providers as scheduled Discussed plans with patient for ongoing care management follow up and provided patient with direct contact information for care management team Emotional Support Provided  Spoke with patient, he reports is doing good right now.  He has pending surgery.  Follow up with Dr. [name] this week.   Explained to him nephrostomy tubes and how they work. He is grateful for the education.  Advised to call CM if he has further questions.  He verbalized understanding.           SDOH assessments and interventions completed:  Yes     Care Coordination Interventions:  Yes, provided   Follow up plan: Follow up call scheduled for March    Encounter Outcome:  Patient Visit Completed   Bary Leriche RN, MSN [facility], [facility] Health RN Care Manager Direct Dial: [phone]  Fax: [phone] Website: [email or web address]

## 2024-01-13 NOTE — Assessment & Plan Note (Signed)
 Will continue prednisone at 20 mg daily until next Friday, then decrease prednisone to 10 mg daily on 3/1 and continue until surgery and stop Holding PPI Recommend follow up with nephrology after surgery for evaluation Report to ED if unable to urinate, nausea, profound fatigue Continue fluid to 60-70 oz per day

## 2024-01-13 NOTE — Progress Notes (Unsigned)
 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    Patient received 1 dose of pembrolizumab and developed elevated creatinine up to 4.07. Baseline mostly below 3.4. Treatment stopped. Repeat UA without signs of infectious process.  AKI on CKD improved on prednisone.   [name] [name] now has recurrent AKI after weaning off steroids. Worsening kidney function again. Cr worse today after a week on 20 mg daily.    AKI (acute kidney injury) (HCC) Will continue prednisone at 20 mg daily until next Friday, then decrease prednisone to 10 mg daily on 3/1 and continue until surgery and stop Holding PPI Recommend follow up with nephrology after surgery for evaluation Report to ED if unable to urinate, nausea, profound fatigue Continue fluid to 60-70 oz per day  GERD (gastroesophageal reflux disease) Resume pantoprazole as [name] [name] symptoms got worse.  Hypothyroidism Continue levothryoxine 50 mcg Repeat TSH and fT4 in about 6 weeks.   Return about mid April with labs The patient understands the plans discussed today and is in agreement with them.  [name] [name] knows to contact our office if [name] [name] develops concerns prior to [name] [name] next appointment.  [name], MD  [facility] - [facility]PT [facility] [address] [place] [address] Dept: [phone] Dept Fax: [phone]   Orders Placed This Encounter  Procedures   CBC with Differential (Cancer Center Only)    Standing Status:    Future    Expiration Date:   01/13/2025   CMP (Cancer Center only)    Standing Status:   Future    Expiration Date:   01/13/2025   TSH    Standing Status:   Future    Expiration Date:   03/13/2024   T4, free    Standing Status:   Future    Expiration Date:   03/13/2024      CHIEF COMPLAINT:  CC: AKI and UC  INTERVAL HISTORY:  [name] [name] is here today for repeat clinical assessment.  Report more heartburn off protonix started about 3 days ago. Worse with warm liquid. No bloody or dark stool. There is blood in the urine   No pelvic pain. A little leg swelling more on the right leg but not worse for about 60-90 days. [name] [name] consumes fluid throughout the day about 60 oz per day. U/o is good.  I have reviewed the past medical history, past surgical history, social history and family history with the patient and they are unchanged from previous note.  ALLERGIES:  is allergic to macrobid [nitrofurantoin], ciprofloxacin, keytruda [pembrolizumab], tramadol hcl, cephalexin, and jelmyto [mitomycin].  MEDICATIONS:  Current Outpatient Medications  Medication Sig Dispense Refill   amLODipine (NORVASC) 10 MG tablet Take 1 tablet (10 mg total) by mouth daily. (Patient taking differently: Take 10 mg by mouth at bedtime.) 90 tablet 1   ferrous sulfate 325 (65 FE) MG tablet Take 325 mg by mouth daily with supper.     finasteride (PROSCAR) 5 MG tablet TAKE ONE TABLET BY MOUTH DAILY (Patient taking differently: Take 5  mg by mouth at bedtime.) 90 tablet 3   levothyroxine (SYNTHROID, LEVOTHROID) 50 MCG tablet Take 50 mcg by mouth daily before breakfast.     MYRBETRIQ 50 MG TB24 tablet Take 50 mg by mouth at bedtime.     pantoprazole (PROTONIX) 20 MG tablet Take 20 mg by mouth at bedtime. (Patient not taking: Reported on 01/14/2024)     predniSONE (DELTASONE) 10 MG tablet Start at 30 mg today, continue daily for 1 week, followed by 20 mg daily until follow up 50 tablet 0   psyllium (METAMUCIL) 58.6 % powder Take 0.5  packets by mouth every evening.     rosuvastatin (CRESTOR) 5 MG tablet Take 5 mg by mouth at bedtime.     senna-docusate (SENOKOT-S) 8.6-50 MG tablet Take 1 tablet by mouth 2 (two) times daily. While taking strong pain meds to prevent constipation 10 tablet 0   silodosin (RAPAFLO) 8 MG CAPS capsule Take 1 capsule (8 mg total) by mouth at bedtime. 90 capsule 3   sodium bicarbonate 650 MG tablet Take 650 mg by mouth See admin instructions. Take 650 mg by mouth at noon and midnight     TYLENOL 8 HOUR ARTHRITIS PAIN 650 MG CR tablet Take 1,300 mg by mouth every 8 (eight) hours as needed for pain.     No current facility-administered medications for this visit.    HISTORY OF PRESENT ILLNESS:   Oncology History Overview Note  2000 BPH with elevated PSA. Report biopsy was benign.   2011 to now with multiple TURBT   2021 TaG3 bladder cancer. TURBT + BCG x 6  2023 TaG1 bladder + left renal pelvis cancer, failed [name] >>  08/2022 Left nephroureterectomy + RPLND. 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  06/2023 cysto w/ TURBT multifocal papillary bladder tumor recurrence (dome, left trigone, bladder neck, prostate urethral tissue) ==> TaG3 with negative muscle  Path: A. BLADDER TUMOR, TURBT:  Noninvasive high grade papillary urothelial carcinoma with inverted  growth pattern  muscularis propria (detrusor muscle) is present and not involved by  carcinoma   B. BLADDER TUMOR, NECK, BIOPSY:  Noninvasive high-grade papillary urothelial carcinoma with inverted  growth pattern  Submucosa, prostatic glands and muscularis is present  and not involved  by carcinoma   C. BLADDER TUMOR, BASE, BIOPSY:  Noninvasive high grade papillary urothelial carcinoma  Muscularis propria is present and not involved by carcinoma       Malignant neoplasm of urinary bladder (HCC)  09/15/2019 Initial Diagnosis   Malignant neoplasm of urinary bladder (HCC)   08/05/2023 Cancer Staging   Staging form: Urinary Bladder, AJCC 8th Edition - Clinical: Stage 0a (rcTa, cN0, cM0) - Signed by [name], MD on 08/05/2023 Stage prefix: Recurrence WHO/ISUP grade (low/high): High Grade Histologic grading system: 2 grade system   09/16/2023 - 09/16/2023 Chemotherapy   Patient is on Treatment Plan : BLADDER Pembrolizumab (200) q21d     10/01/2023 Pathology Results   Presented with hematuria and underwent TURBT  A. BLADDER TUMOR, TURBT:  Noninvasive high grade papillary urothelial carcinoma  Muscularis propria (detrusor muscle) is not present  REVIEW OF SYSTEMS:   All relevant systems were reviewed with the patient and are negative.   VITALS:  Blood pressure (!) 149/79, pulse 80, temperature 97.7 F (36.5 C), resp. rate 17, height 5\' 8"  (1.727 m), weight 174 lb (78.9 kg), SpO2 100%.  Wt Readings from Last 3 Encounters:  01/14/24 174 lb (78.9 kg)  01/03/24 171 lb 14.4 oz (78 kg)  12/21/23 171 lb 15.3 oz (78 kg)    Body mass index is 26.46 kg/m.  Performance status (ECOG): 1 - Symptomatic but completely ambulatory  PHYSICAL EXAM:   GENERAL:alert, no distress and comfortable SKIN: skin color normal, no rashes. Right arm burise  Musculoskeletal: bilateral LE edema  LABORATORY DATA:  I have reviewed the data as listed    Component Value Date/Time   NA 139 01/14/2024 0927   K 4.6 01/14/2024 0927   CL 107 01/14/2024 0927   CO2 23 01/14/2024 0927   GLUCOSE 128 (H) 01/14/2024 0927   BUN 80 (H) 01/14/2024 0927   CREATININE 3.48 (H) 01/14/2024 0927   CALCIUM 8.9 01/14/2024 0927   PROT 7.4 11/29/2023 1546   ALBUMIN  3.8 11/29/2023 1546   AST 22 11/29/2023 1546   AST 18 10/25/2023 0935   ALT 39 11/29/2023 1546   ALT 29 10/25/2023 0935   ALKPHOS 69 11/29/2023 1546   BILITOT 0.5 11/29/2023 1546   BILITOT 0.6 10/25/2023 0935   GFRNONAA 16 (L) 01/14/2024 0927   GFRAA 35 (L) 08/23/2020 0524    No results found for: "SPEP", "UPEP"  Lab Results  Component Value Date   WBC 7.8 01/03/2024   NEUTROABS 6.5 01/03/2024   HGB 11.4 (L) 01/03/2024   HCT 35.6 (L) 01/03/2024   MCV 91.3 01/03/2024   PLT 259 01/03/2024      Chemistry      Component Value Date/Time   NA 139 01/14/2024 0927   K 4.6 01/14/2024 0927   CL 107 01/14/2024 0927   CO2 23 01/14/2024 0927   BUN 80 (H) 01/14/2024 0927   CREATININE 3.48 (H) 01/14/2024 0927      Component Value Date/Time   CALCIUM 8.9 01/14/2024 0927   ALKPHOS 69 11/29/2023 1546   AST 22 11/29/2023 1546   AST 18 10/25/2023 0935   ALT 39 11/29/2023 1546   ALT 29 10/25/2023 0935   BILITOT 0.5 11/29/2023 1546   BILITOT 0.6 10/25/2023 0935       RADIOGRAPHIC STUDIES: I have personally reviewed the radiological images as listed and agreed with the findings in the report. No results found.

## 2024-01-14 ENCOUNTER — Inpatient Hospital Stay (HOSPITAL_BASED_OUTPATIENT_CLINIC_OR_DEPARTMENT_OTHER): Payer: Medicare Other

## 2024-01-14 ENCOUNTER — Inpatient Hospital Stay: Payer: Medicare Other

## 2024-01-14 VITALS — BP 149/79 | HR 80 | Temp 97.7°F | Resp 17 | Ht 68.0 in | Wt 174.0 lb

## 2024-01-14 DIAGNOSIS — N179 Acute kidney failure, unspecified: Secondary | ICD-10-CM | POA: Diagnosis not present

## 2024-01-14 DIAGNOSIS — Z7952 Long term (current) use of systemic steroids: Secondary | ICD-10-CM | POA: Diagnosis not present

## 2024-01-14 DIAGNOSIS — E039 Hypothyroidism, unspecified: Secondary | ICD-10-CM

## 2024-01-14 DIAGNOSIS — Z79899 Other long term (current) drug therapy: Secondary | ICD-10-CM | POA: Diagnosis not present

## 2024-01-14 DIAGNOSIS — K219 Gastro-esophageal reflux disease without esophagitis: Secondary | ICD-10-CM | POA: Diagnosis not present

## 2024-01-14 DIAGNOSIS — E538 Deficiency of other specified B group vitamins: Secondary | ICD-10-CM | POA: Diagnosis not present

## 2024-01-14 DIAGNOSIS — C678 Malignant neoplasm of overlapping sites of bladder: Secondary | ICD-10-CM | POA: Diagnosis not present

## 2024-01-14 LAB — BASIC METABOLIC PANEL - CANCER CENTER ONLY
Anion gap: 9 (ref 5–15)
BUN: 80 mg/dL — ABNORMAL HIGH (ref 8–23)
CO2: 23 mmol/L (ref 22–32)
Calcium: 8.9 mg/dL (ref 8.9–10.3)
Chloride: 107 mmol/L (ref 98–111)
Creatinine: 3.48 mg/dL — ABNORMAL HIGH (ref 0.61–1.24)
GFR, Estimated: 16 mL/min — ABNORMAL LOW (ref >60)
Glucose, Bld: 128 mg/dL — ABNORMAL HIGH (ref 70–99)
Potassium: 4.6 mmol/L (ref 3.5–5.1)
Sodium: 139 mmol/L (ref 135–145)

## 2024-01-14 NOTE — Assessment & Plan Note (Signed)
 Continue levothryoxine 50 mcg Repeat TSH and fT4 in about 6 weeks.

## 2024-01-14 NOTE — Assessment & Plan Note (Signed)
 Resume pantoprazole as his symptoms got worse.

## 2024-01-24 ENCOUNTER — Ambulatory Visit (HOSPITAL_COMMUNITY)
Admission: RE | Admit: 2024-01-24 | Discharge: 2024-01-24 | Disposition: A | Discharge status: Home / Self Care | Payer: Medicare Other | Source: Ambulatory Visit | Attending: Nurse Practitioner | Admitting: Nurse Practitioner

## 2024-01-24 DIAGNOSIS — Z7189 Other specified counseling: Secondary | ICD-10-CM | POA: Diagnosis not present

## 2024-01-24 DIAGNOSIS — C679 Malignant neoplasm of bladder, unspecified: Secondary | ICD-10-CM | POA: Diagnosis present

## 2024-01-24 NOTE — Progress Notes (Signed)
 [facility]   Reason for visit:  Presurgical marking for ileal conduit HPI:  Bladder cancer   Past Medical History:  Diagnosis Date   Anemia    Arthritis    lower back bulging disc, hips, knees, thumbs, shoulder   Asthma    as a pre teen   Bladder cancer (HCC) UROLOGIST-  DR MCKENZIE   S/P TURBT 08-16-2017   Bladder wall hemorrhage 09/28/2023   BPH (benign prostatic hyperplasia)    CKD (chronic kidney disease), stage III (HCC)    STAGE 3  B CKD per lov 10-16-2020 dr Thyra Breed [facility] kidney on chart   Dyspnea    Fatigue    MIDDLE OF DAY ON OCCASION   Frequency of urination    GERD (gastroesophageal reflux disease)    on protonix   Hematuria    Hematuria    History of colon polyps    HLD (hyperlipidemia)    HOH (hard of hearing)    slightly hoh right worse thanleft   HTN (hypertension)    Hypothyroidism    Incontinence of urine    weras pads all the time   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   Right Buccal Cheek   Pre-diabetes    Recurrent bladder papillary carcinoma (HCC) hx 2012 and 2014-- urologist- dr Ronne Binning   s/p  TURBT 08-16-2017  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   Renal mass 09/02/2022   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 10/02/2023   Sleep apnea    uses cpap set on 20 /4   Trigger finger of left hand 07/04/2021   last few months per pt   UTI (urinary tract infection) 08/20/2020   Weakness of extremity    left knee   Wears glasses    Family History  Problem Relation Age of Onset   Hypertension Mother    Breast cancer Mother    Other Mother        brain tumor   Alzheimer's disease Mother    Emphysema Father        smoker   Allergies  Allergen Reactions   Macrobid [Nitrofurantoin] Shortness Of Breath   Ciprofloxacin Diarrhea and Other (See Comments)    Occurred with 750 mg dose. Has since taken 500 mg doses and has had no reaction/diarrhea.   Keytruda [Pembrolizumab] Other  (See Comments)    Weakness in both legs and fatigue   Tramadol Hcl Other (See Comments)    Pt unsure of reaction    Cephalexin Rash and Other (See Comments)    Rash on arms   [name] [Mitomycin] Rash and Other (See Comments)    All over body RASH; possibly triggered kidney REMOVAL and RESTLESS LEGS also   Current Outpatient Medications  Medication Sig Dispense Refill Last Dose/Taking   amLODipine (NORVASC) 10 MG tablet Take 1 tablet (10 mg total) by mouth daily. (Patient taking differently: Take 10 mg by mouth at bedtime.) 90 tablet 1    ferrous sulfate 325 (65 FE) MG tablet Take 325 mg by mouth daily with supper.      finasteride (PROSCAR) 5 MG tablet TAKE ONE TABLET BY MOUTH DAILY (Patient taking differently: Take 5 mg by mouth at bedtime.) 90 tablet 3    levothyroxine (SYNTHROID, LEVOTHROID) 50 MCG tablet Take 50 mcg by mouth daily before breakfast.      MYRBETRIQ 50 MG TB24 tablet Take 50 mg by mouth at bedtime.      pantoprazole (PROTONIX) 20  MG tablet Take 20 mg by mouth at bedtime. (Patient not taking: Reported on 01/14/2024)      predniSONE (DELTASONE) 10 MG tablet Start at 30 mg today, continue daily for 1 week, followed by 20 mg daily until follow up 50 tablet 0    psyllium (METAMUCIL) 58.6 % powder Take 0.5 packets by mouth every evening.      rosuvastatin (CRESTOR) 5 MG tablet Take 5 mg by mouth at bedtime.      senna-docusate (SENOKOT-S) 8.6-50 MG tablet Take 1 tablet by mouth 2 (two) times daily. While taking strong pain meds to prevent constipation 10 tablet 0    silodosin (RAPAFLO) 8 MG CAPS capsule Take 1 capsule (8 mg total) by mouth at bedtime. 90 capsule 3    sodium bicarbonate 650 MG tablet Take 650 mg by mouth See admin instructions. Take 650 mg by mouth at noon and midnight      TYLENOL 8 HOUR ARTHRITIS PAIN 650 MG CR tablet Take 1,300 mg by mouth every 8 (eight) hours as needed for pain.      No current facility-administered medications for this encounter.   ROS   Review of Systems  Endocrine:       Prediabetes  Genitourinary:  Positive for decreased urine volume.       Bladder cancer  Skin: Negative.   Psychiatric/Behavioral:  The patient is nervous/anxious.   All other systems reviewed and are negative.  Vital signs:  BP (!) 151/76   Pulse 92   Temp 97.6 F (36.4 C) (Oral)   Resp 18   SpO2 100%  Exam:  Physical Exam Vitals reviewed.  Constitutional:      Appearance: Normal appearance. He is normal weight.  HENT:     Mouth/Throat:     Mouth: Mucous membranes are moist.  Cardiovascular:     Rate and Rhythm: Normal rate.  Abdominal:     Palpations: Abdomen is soft.  Musculoskeletal:        General: Normal range of motion.  Skin:    General: Skin is warm and dry.  Neurological:     Mental Status: He is alert.     Discussed surgical procedure and stoma creation with patient and spouse   Explained role of the inpatient WOC nurse team in the immediate post op period. Provided the patient with educational booklet and provided samples of pouching options.  Answered patient and family questions.   Examined patient lying, sitting, and standing in order to place the marking in the patient's visual field, away from any creases or abdominal contour issues and within the rectus muscle.    Marked for ileal conduit in the RUQ 5 cm to the right of the umbilicus and  4 cm above the umbilicus.   Patient's abdomen cleansed with CHG wipes at site markings, allowed to air dry prior to marking.Covered mark with thin film transparent dressing to preserve mark until date of surgery.   WOC Nurse team will follow up with patient after surgery for continue ostomy care and teaching.     Impression/dx  Presurgical counseling Discussion  See above   Life with an ostomy ROle of inpatient ostomy team Ostomy clinic as outpatient resource Plan  See back postoperatively    Visit time: 45 minutes.   Mike Gip FNP-BC

## 2024-01-25 DIAGNOSIS — R8271 Bacteriuria: Secondary | ICD-10-CM | POA: Diagnosis not present

## 2024-01-25 DIAGNOSIS — C678 Malignant neoplasm of overlapping sites of bladder: Secondary | ICD-10-CM | POA: Diagnosis not present

## 2024-02-03 ENCOUNTER — Encounter (HOSPITAL_COMMUNITY): Payer: Self-pay | Admitting: Urology

## 2024-02-03 ENCOUNTER — Other Ambulatory Visit: Payer: Self-pay

## 2024-02-03 ENCOUNTER — Inpatient Hospital Stay (HOSPITAL_COMMUNITY)
Admission: RE | Admit: 2024-02-03 | Discharge: 2024-02-08 | DRG: 654 | Disposition: A | Discharge status: Home / Self Care | Payer: Medicare Other | Attending: Urology | Admitting: Urology

## 2024-02-03 DIAGNOSIS — N184 Chronic kidney disease, stage 4 (severe): Secondary | ICD-10-CM | POA: Diagnosis present

## 2024-02-03 DIAGNOSIS — C675 Malignant neoplasm of bladder neck: Principal | ICD-10-CM | POA: Diagnosis present

## 2024-02-03 DIAGNOSIS — N400 Enlarged prostate without lower urinary tract symptoms: Secondary | ICD-10-CM | POA: Diagnosis present

## 2024-02-03 DIAGNOSIS — M1990 Unspecified osteoarthritis, unspecified site: Secondary | ICD-10-CM | POA: Diagnosis not present

## 2024-02-03 DIAGNOSIS — R29898 Other symptoms and signs involving the musculoskeletal system: Principal | ICD-10-CM

## 2024-02-03 DIAGNOSIS — C678 Malignant neoplasm of overlapping sites of bladder: Secondary | ICD-10-CM | POA: Diagnosis not present

## 2024-02-03 DIAGNOSIS — N189 Chronic kidney disease, unspecified: Secondary | ICD-10-CM | POA: Diagnosis not present

## 2024-02-03 DIAGNOSIS — Z905 Acquired absence of kidney: Secondary | ICD-10-CM

## 2024-02-03 DIAGNOSIS — C662 Malignant neoplasm of left ureter: Secondary | ICD-10-CM | POA: Diagnosis not present

## 2024-02-03 DIAGNOSIS — I129 Hypertensive chronic kidney disease with stage 1 through stage 4 chronic kidney disease, or unspecified chronic kidney disease: Secondary | ICD-10-CM | POA: Diagnosis present

## 2024-02-03 DIAGNOSIS — N200 Calculus of kidney: Secondary | ICD-10-CM | POA: Diagnosis not present

## 2024-02-03 DIAGNOSIS — G4730 Sleep apnea, unspecified: Secondary | ICD-10-CM | POA: Diagnosis present

## 2024-02-03 DIAGNOSIS — Z85828 Personal history of other malignant neoplasm of skin: Secondary | ICD-10-CM

## 2024-02-03 DIAGNOSIS — Z79899 Other long term (current) drug therapy: Secondary | ICD-10-CM | POA: Diagnosis not present

## 2024-02-03 DIAGNOSIS — Z85528 Personal history of other malignant neoplasm of kidney: Secondary | ICD-10-CM | POA: Diagnosis not present

## 2024-02-03 DIAGNOSIS — E785 Hyperlipidemia, unspecified: Secondary | ICD-10-CM | POA: Diagnosis not present

## 2024-02-03 DIAGNOSIS — C679 Malignant neoplasm of bladder, unspecified: Principal | ICD-10-CM | POA: Diagnosis present

## 2024-02-03 DIAGNOSIS — Z79890 Hormone replacement therapy (postmenopausal): Secondary | ICD-10-CM

## 2024-02-03 DIAGNOSIS — D090 Carcinoma in situ of bladder: Secondary | ICD-10-CM | POA: Diagnosis not present

## 2024-02-03 DIAGNOSIS — D631 Anemia in chronic kidney disease: Secondary | ICD-10-CM | POA: Diagnosis not present

## 2024-02-03 DIAGNOSIS — K439 Ventral hernia without obstruction or gangrene: Secondary | ICD-10-CM | POA: Diagnosis present

## 2024-02-03 DIAGNOSIS — E039 Hypothyroidism, unspecified: Secondary | ICD-10-CM | POA: Diagnosis not present

## 2024-02-03 DIAGNOSIS — Z8249 Family history of ischemic heart disease and other diseases of the circulatory system: Secondary | ICD-10-CM | POA: Diagnosis not present

## 2024-02-03 LAB — CBC
HCT: 33.3 % — ABNORMAL LOW (ref 39.0–52.0)
Hemoglobin: 10.3 g/dL — ABNORMAL LOW (ref 13.0–17.0)
MCH: 30 pg (ref 26.0–34.0)
MCHC: 30.9 g/dL (ref 30.0–36.0)
MCV: 97.1 fL (ref 80.0–100.0)
Platelets: 194 10*3/uL (ref 150–400)
RBC: 3.43 MIL/uL — ABNORMAL LOW (ref 4.22–5.81)
RDW: 18.1 % — ABNORMAL HIGH (ref 11.5–15.5)
WBC: 12.8 10*3/uL — ABNORMAL HIGH (ref 4.0–10.5)
nRBC: 0 % (ref 0.0–0.2)

## 2024-02-03 LAB — COMPREHENSIVE METABOLIC PANEL
ALT: 31 U/L (ref 0–44)
AST: 19 U/L (ref 15–41)
Albumin: 3.5 g/dL (ref 3.5–5.0)
Alkaline Phosphatase: 51 U/L (ref 38–126)
Anion gap: 13 (ref 5–15)
BUN: 79 mg/dL — ABNORMAL HIGH (ref 8–23)
CO2: 17 mmol/L — ABNORMAL LOW (ref 22–32)
Calcium: 8.9 mg/dL (ref 8.9–10.3)
Chloride: 109 mmol/L (ref 98–111)
Creatinine, Ser: 4.5 mg/dL — ABNORMAL HIGH (ref 0.61–1.24)
GFR, Estimated: 12 mL/min — ABNORMAL LOW (ref >60)
Glucose, Bld: 245 mg/dL — ABNORMAL HIGH (ref 70–99)
Potassium: 5 mmol/L (ref 3.5–5.1)
Sodium: 139 mmol/L (ref 135–145)
Total Bilirubin: 0.6 mg/dL (ref 0.0–1.2)
Total Protein: 6.4 g/dL — ABNORMAL LOW (ref 6.5–8.1)

## 2024-02-03 LAB — TYPE AND SCREEN
ABO/RH(D): A POS
Antibody Screen: NEGATIVE

## 2024-02-03 MED ORDER — SODIUM CHLORIDE 0.9 % IV SOLN: INTRAVENOUS | Status: DC

## 2024-02-03 MED ORDER — AMLODIPINE BESYLATE 5 MG PO TABS
10.0000 mg | ORAL_TABLET | Freq: Every day | ORAL | Status: DC
  Administered 2024-02-03 – 2024-02-07 (×5): 10 mg via ORAL
  Filled 2024-02-03 (×5): qty 2

## 2024-02-03 MED ORDER — PEG 3350-KCL-NA BICARB-NACL 420 G PO SOLR
4000.0000 mL | Freq: Once | ORAL | Status: AC
  Administered 2024-02-03: 4000 mL via ORAL

## 2024-02-03 MED ORDER — NEOMYCIN SULFATE 500 MG PO TABS
500.0000 mg | ORAL_TABLET | ORAL | Status: AC
Start: 2024-02-03 — End: 2024-02-04
  Administered 2024-02-03 – 2024-02-04 (×2): 500 mg via ORAL
  Filled 2024-02-03 (×2): qty 1

## 2024-02-03 MED ORDER — METRONIDAZOLE 500 MG PO TABS
500.0000 mg | ORAL_TABLET | ORAL | Status: AC
  Administered 2024-02-03 – 2024-02-04 (×2): 500 mg via ORAL
  Filled 2024-02-03 (×2): qty 1

## 2024-02-03 MED ORDER — ROSUVASTATIN CALCIUM 5 MG PO TABS
5.0000 mg | ORAL_TABLET | Freq: Every day | ORAL | Status: DC
  Administered 2024-02-03 – 2024-02-07 (×5): 5 mg via ORAL
  Filled 2024-02-03 (×5): qty 1

## 2024-02-03 MED ORDER — PIPERACILLIN-TAZOBACTAM 3.375 G IVPB 30 MIN
3.3750 g | INTRAVENOUS | Status: AC
  Administered 2024-02-04: 3.375 g via INTRAVENOUS
  Filled 2024-02-03: qty 50

## 2024-02-03 MED ORDER — LEVOTHYROXINE SODIUM 50 MCG PO TABS
50.0000 ug | ORAL_TABLET | Freq: Every day | ORAL | Status: DC
  Administered 2024-02-05 – 2024-02-08 (×4): 50 ug via ORAL
  Filled 2024-02-03 (×4): qty 1

## 2024-02-04 ENCOUNTER — Other Ambulatory Visit: Payer: Self-pay

## 2024-02-04 ENCOUNTER — Inpatient Hospital Stay (HOSPITAL_COMMUNITY): Admitting: Certified Registered"

## 2024-02-04 ENCOUNTER — Encounter (HOSPITAL_COMMUNITY)
Admission: RE | Disposition: A | Discharge status: Home / Self Care | Payer: Self-pay | Source: Home / Self Care | Attending: Urology

## 2024-02-04 ENCOUNTER — Encounter (HOSPITAL_COMMUNITY): Payer: Self-pay | Admitting: Urology

## 2024-02-04 DIAGNOSIS — I129 Hypertensive chronic kidney disease with stage 1 through stage 4 chronic kidney disease, or unspecified chronic kidney disease: Secondary | ICD-10-CM | POA: Diagnosis not present

## 2024-02-04 DIAGNOSIS — N189 Chronic kidney disease, unspecified: Secondary | ICD-10-CM | POA: Diagnosis not present

## 2024-02-04 DIAGNOSIS — N200 Calculus of kidney: Secondary | ICD-10-CM

## 2024-02-04 DIAGNOSIS — C679 Malignant neoplasm of bladder, unspecified: Secondary | ICD-10-CM

## 2024-02-04 HISTORY — PX: ROBOT LAP RADICAL CYSTOPROSTATECTOMY PELVIC LYMPHADENECTOMY, NEOBLADDER: SHX7395

## 2024-02-04 HISTORY — PX: URETEROSCOPY: SHX842

## 2024-02-04 LAB — SURGICAL PCR SCREEN
MRSA, PCR: NEGATIVE
Staphylococcus aureus: NEGATIVE

## 2024-02-04 LAB — HEMOGLOBIN AND HEMATOCRIT, BLOOD
HCT: 30 % — ABNORMAL LOW (ref 39.0–52.0)
Hemoglobin: 9.8 g/dL — ABNORMAL LOW (ref 13.0–17.0)

## 2024-02-04 SURGERY — ROBOT ASSISTED LAPAROSCOPIC RADICAL CYSTOPROSTATECTOMY BILATERAL PELVIC LYMPHADENECTOMY,ORTHOTOPIC NEOBLADDER
Anesthesia: General | Site: Pelvis | Laterality: Right

## 2024-02-04 MED ORDER — VASOPRESSIN 20 UNIT/ML IV SOLN
INTRAVENOUS | Status: AC
  Filled 2024-02-04: qty 1

## 2024-02-04 MED ORDER — OXYCODONE HCL 5 MG PO TABS
5.0000 mg | ORAL_TABLET | ORAL | Status: DC | PRN
  Administered 2024-02-04 – 2024-02-08 (×9): 5 mg via ORAL
  Filled 2024-02-04 (×9): qty 1

## 2024-02-04 MED ORDER — ONDANSETRON HCL 4 MG/2ML IJ SOLN: 4.0000 mg | INTRAMUSCULAR | Status: DC | PRN

## 2024-02-04 MED ORDER — SODIUM CHLORIDE 0.9 % IV SOLN
INTRAVENOUS | Status: DC | PRN
Start: 2024-02-04 — End: 2024-02-04

## 2024-02-04 MED ORDER — FENTANYL CITRATE (PF) 250 MCG/5ML IJ SOLN
INTRAMUSCULAR | Status: AC
  Filled 2024-02-04: qty 5

## 2024-02-04 MED ORDER — ROCURONIUM BROMIDE 10 MG/ML (PF) SYRINGE
PREFILLED_SYRINGE | INTRAVENOUS | Status: DC | PRN
  Administered 2024-02-04: 30 mg via INTRAVENOUS
  Administered 2024-02-04: 70 mg via INTRAVENOUS

## 2024-02-04 MED ORDER — PROPOFOL 10 MG/ML IV BOLUS
INTRAVENOUS | Status: DC | PRN
  Administered 2024-02-04: 150 mg via INTRAVENOUS
  Administered 2024-02-04: 150 ug/kg/min via INTRAVENOUS
  Administered 2024-02-04: 100 ug/kg/min via INTRAVENOUS
  Administered 2024-02-04 (×2): 20 mg via INTRAVENOUS
  Administered 2024-02-04: 10 mg via INTRAVENOUS
  Administered 2024-02-04: 20 mg via INTRAVENOUS

## 2024-02-04 MED ORDER — OXYCODONE HCL 5 MG PO TABS
ORAL_TABLET | ORAL | Status: AC
  Filled 2024-02-04: qty 1

## 2024-02-04 MED ORDER — BUPIVACAINE LIPOSOME 1.3 % IJ SUSP
INTRAMUSCULAR | Status: AC
  Filled 2024-02-04: qty 20

## 2024-02-04 MED ORDER — SODIUM CHLORIDE 0.9 % IR SOLN
Status: DC | PRN
  Administered 2024-02-04: 3000 mL

## 2024-02-04 MED ORDER — ALBUMIN HUMAN 5 % IV SOLN: INTRAVENOUS | Status: DC | PRN

## 2024-02-04 MED ORDER — FENTANYL CITRATE PF 50 MCG/ML IJ SOSY
25.0000 ug | PREFILLED_SYRINGE | INTRAMUSCULAR | Status: DC | PRN
  Administered 2024-02-04: 50 ug via INTRAVENOUS

## 2024-02-04 MED ORDER — ACETAMINOPHEN 500 MG PO TABS
1000.0000 mg | ORAL_TABLET | Freq: Four times a day (QID) | ORAL | Status: DC
  Administered 2024-02-04 – 2024-02-05 (×3): 1000 mg via ORAL
  Filled 2024-02-04 (×2): qty 2

## 2024-02-04 MED ORDER — DOCUSATE SODIUM 100 MG PO CAPS
100.0000 mg | ORAL_CAPSULE | Freq: Two times a day (BID) | ORAL | Status: DC
  Administered 2024-02-04 – 2024-02-08 (×7): 100 mg via ORAL
  Filled 2024-02-04 (×7): qty 1

## 2024-02-04 MED ORDER — ONDANSETRON HCL 4 MG/2ML IJ SOLN
INTRAMUSCULAR | Status: DC | PRN
  Administered 2024-02-04: 4 mg via INTRAVENOUS

## 2024-02-04 MED ORDER — ALVIMOPAN 12 MG PO CAPS: 12.0000 mg | ORAL_CAPSULE | ORAL | Status: DC

## 2024-02-04 MED ORDER — SODIUM BICARBONATE 8.4 % IV SOLN
50.0000 meq | Freq: Once | INTRAVENOUS | Status: AC
  Administered 2024-02-04: 50 meq via INTRAVENOUS
  Filled 2024-02-04: qty 50

## 2024-02-04 MED ORDER — DIPHENHYDRAMINE HCL 50 MG/ML IJ SOLN: 12.5000 mg | Freq: Four times a day (QID) | INTRAMUSCULAR | Status: DC | PRN

## 2024-02-04 MED ORDER — FENTANYL CITRATE PF 50 MCG/ML IJ SOSY
PREFILLED_SYRINGE | INTRAMUSCULAR | Status: AC
  Administered 2024-02-04: 50 ug via INTRAVENOUS
  Filled 2024-02-04: qty 1

## 2024-02-04 MED ORDER — HYDROMORPHONE HCL 1 MG/ML IJ SOLN
0.5000 mg | INTRAMUSCULAR | Status: DC | PRN
  Administered 2024-02-04 – 2024-02-06 (×2): 1 mg via INTRAVENOUS
  Filled 2024-02-04 (×2): qty 1

## 2024-02-04 MED ORDER — FENTANYL CITRATE PF 50 MCG/ML IJ SOSY
PREFILLED_SYRINGE | INTRAMUSCULAR | Status: AC
Start: 2024-02-04 — End: 2024-02-04
  Filled 2024-02-04: qty 1

## 2024-02-04 MED ORDER — SODIUM CHLORIDE (PF) 0.9 % IJ SOLN
INTRAMUSCULAR | Status: DC | PRN
  Administered 2024-02-04: 40 mL

## 2024-02-04 MED ORDER — STERILE WATER FOR IRRIGATION IR SOLN
Status: DC | PRN
  Administered 2024-02-04: 1000 mL

## 2024-02-04 MED ORDER — SODIUM CHLORIDE (PF) 0.9 % IJ SOLN
INTRAMUSCULAR | Status: AC
Start: 2024-02-04 — End: ?
  Filled 2024-02-04: qty 20

## 2024-02-04 MED ORDER — ACETAMINOPHEN 10 MG/ML IV SOLN
INTRAVENOUS | Status: DC | PRN
  Administered 2024-02-04: 1000 mg via INTRAVENOUS

## 2024-02-04 MED ORDER — DIPHENHYDRAMINE HCL 12.5 MG/5ML PO ELIX: 12.5000 mg | ORAL_SOLUTION | Freq: Four times a day (QID) | ORAL | Status: DC | PRN

## 2024-02-04 MED ORDER — ACETAMINOPHEN 10 MG/ML IV SOLN: 1000.0000 mg | Freq: Once | INTRAVENOUS | Status: DC | PRN

## 2024-02-04 MED ORDER — SODIUM CHLORIDE 0.9 % IV SOLN: INTRAVENOUS | Status: AC

## 2024-02-04 MED ORDER — SUGAMMADEX SODIUM 200 MG/2ML IV SOLN
INTRAVENOUS | Status: DC | PRN
  Administered 2024-02-04: 300 mg via INTRAVENOUS

## 2024-02-04 MED ORDER — FENTANYL CITRATE (PF) 100 MCG/2ML IJ SOLN
INTRAMUSCULAR | Status: DC | PRN
  Administered 2024-02-04 (×5): 50 ug via INTRAVENOUS

## 2024-02-04 MED ORDER — EPINEPHRINE PF 1 MG/ML IJ SOLN
INTRAMUSCULAR | Status: AC
  Filled 2024-02-04: qty 1

## 2024-02-04 MED ORDER — PHENYLEPHRINE HCL-NACL 20-0.9 MG/250ML-% IV SOLN
INTRAVENOUS | Status: DC | PRN
  Administered 2024-02-04: 50 ug/min via INTRAVENOUS

## 2024-02-04 MED ORDER — EPHEDRINE SULFATE (PRESSORS) 50 MG/ML IJ SOLN
INTRAMUSCULAR | Status: DC | PRN
  Administered 2024-02-04: 10 mg via INTRAVENOUS

## 2024-02-04 MED ORDER — ACETAMINOPHEN 500 MG PO TABS
1000.0000 mg | ORAL_TABLET | Freq: Four times a day (QID) | ORAL | Status: DC
  Filled 2024-02-04: qty 2

## 2024-02-04 MED ORDER — LIDOCAINE HCL (PF) 2 % IJ SOLN
INTRAMUSCULAR | Status: DC | PRN
  Administered 2024-02-04: 60 mg via INTRADERMAL

## 2024-02-04 MED ORDER — ACETAMINOPHEN 10 MG/ML IV SOLN
INTRAVENOUS | Status: AC
  Filled 2024-02-04: qty 100

## 2024-02-04 SURGICAL SUPPLY — 69 items
APPLICATOR COTTON TIP 6 STRL (MISCELLANEOUS) ×4 IMPLANT
APPLICATOR COTTON TIP 6IN STRL (MISCELLANEOUS) ×3 IMPLANT
BAG COUNTER SPONGE SURGICOUNT (BAG) IMPLANT
BAG LAPAROSCOPIC 12 15 PORT 16 (BASKET) IMPLANT
BAG RETRIEVAL 12/15 (BASKET) ×3 IMPLANT
CATH FOLEY 2WAY SLVR 18FR 30CC (CATHETERS) ×4 IMPLANT
CHLORAPREP W/TINT 26 (MISCELLANEOUS) ×4 IMPLANT
CLIP LIGATING HEM O LOK PURPLE (MISCELLANEOUS) ×8 IMPLANT
CLIP LIGATING HEMO O LOK GREEN (MISCELLANEOUS) IMPLANT
CONNECTOR CATH FOLEY FEMALE LL (CATHETERS) IMPLANT
COVER SURGICAL LIGHT HANDLE (MISCELLANEOUS) ×4 IMPLANT
COVER TIP SHEARS 8 DVNC (MISCELLANEOUS) ×4 IMPLANT
CUTTER ECHEON FLEX ENDO 45 340 (ENDOMECHANICALS) IMPLANT
DERMABOND ADVANCED .7 DNX12 (GAUZE/BANDAGES/DRESSINGS) ×4 IMPLANT
DRAIN CHANNEL RND F F (WOUND CARE) IMPLANT
DRAPE ARM DVNC X/XI (DISPOSABLE) ×16 IMPLANT
DRAPE COLUMN DVNC XI (DISPOSABLE) ×4 IMPLANT
DRIVER NDL LRG 8 DVNC XI (INSTRUMENTS) ×8 IMPLANT
DRIVER NDLE LRG 8 DVNC XI (INSTRUMENTS) ×6 IMPLANT
DRSG TEGADERM 4X4.75 (GAUZE/BANDAGES/DRESSINGS) ×4 IMPLANT
ELECT REM PT RETURN 15FT ADLT (MISCELLANEOUS) ×4 IMPLANT
FORCEPS BPLR FENES DVNC XI (FORCEP) ×4 IMPLANT
FORCEPS BPLR LNG DVNC XI (INSTRUMENTS) ×4 IMPLANT
FORCEPS PROGRASP DVNC XI (FORCEP) ×4 IMPLANT
GAUZE 4X4 16PLY ~~LOC~~+RFID DBL (SPONGE) IMPLANT
GLOVE BIO SURGEON STRL SZ 6.5 (GLOVE) ×4 IMPLANT
GLOVE SURG LX STRL 7.5 STRW (GLOVE) ×8 IMPLANT
GOWN SRG XL LVL 4 BRTHBL STRL (GOWNS) ×4 IMPLANT
HOLDER FOLEY CATH W/STRAP (MISCELLANEOUS) ×4 IMPLANT
IRRIG SUCT STRYKERFLOW 2 WTIP (MISCELLANEOUS) ×3 IMPLANT
IRRIGATION SUCT STRKRFLW 2 WTP (MISCELLANEOUS) ×4 IMPLANT
KIT PROCEDURE DVNC SI (MISCELLANEOUS) IMPLANT
KIT TURNOVER KIT A (KITS) IMPLANT
MANIFOLD NEPTUNE II (INSTRUMENTS) ×4 IMPLANT
NDL ASPIRATION 22 (NEEDLE) ×4 IMPLANT
NDL INSUFFLATION 14GA 120MM (NEEDLE) ×4 IMPLANT
NEEDLE ASPIRATION 22 (NEEDLE) ×3 IMPLANT
NEEDLE INSUFFLATION 14GA 120MM (NEEDLE) ×3 IMPLANT
PACK ROBOT UROLOGY CUSTOM (CUSTOM PROCEDURE TRAY) ×4 IMPLANT
PAD POSITIONING PINK XL (MISCELLANEOUS) ×4 IMPLANT
PORT ACCESS TROCAR AIRSEAL 12 (TROCAR) ×4 IMPLANT
RELOAD STAPLE 60 2.6 WHT THN (STAPLE) IMPLANT
RELOAD STAPLE 60 4.1 GRN THCK (STAPLE) IMPLANT
SCISSORS MNPLR CVD DVNC XI (INSTRUMENTS) ×4 IMPLANT
SEAL UNIV 5-12 XI (MISCELLANEOUS) ×16 IMPLANT
SET CYSTO W/LG BORE CLAMP LF (SET/KITS/TRAYS/PACK) IMPLANT
SET TRI-LUMEN FLTR TB AIRSEAL (TUBING) ×4 IMPLANT
SLEEVE ADV FIXATION 12X100MM (TROCAR) ×4 IMPLANT
SOL ELECTROSURG ANTI STICK (MISCELLANEOUS) ×3 IMPLANT
SOLUTION ELECTROSURG ANTI STCK (MISCELLANEOUS) ×4 IMPLANT
SPIKE FLUID TRANSFER (MISCELLANEOUS) ×4 IMPLANT
SPONGE T-LAP 4X18 ~~LOC~~+RFID (SPONGE) ×4 IMPLANT
STAPLER RELOAD GREEN 60MM (STAPLE) ×3 IMPLANT
STAPLER RELOAD WHITE 60MM (STAPLE) ×12 IMPLANT
STENT SET URETHERAL RIGHT 7FR (STENTS) IMPLANT
SUT ETHILON 3 0 PS 1 (SUTURE) ×4 IMPLANT
SUT MNCRL AB 4-0 PS2 18 (SUTURE) ×8 IMPLANT
SUT PDS AB 0 CT1 36 (SUTURE) ×12 IMPLANT
SUT STRATA PDS 2-0 15 CT-2.5 (SUTURE) IMPLANT
SUT STRATAFIX SPIRAL 3-0 PDS+ (SUTURE) IMPLANT
SUT VICRYL 0 27 CT2 27 ABS (SUTURE) IMPLANT
SUT VICRYL 0 UR6 27IN ABS (SUTURE) ×4 IMPLANT
SUTURE STRAT PDS 2-0 15 CT-2.5 (SUTURE) IMPLANT
SUTURE STRATFX SPIRAL 3-0 PDS+ (SUTURE) IMPLANT
SYS BAG RETRIEVAL 10MM (BASKET) IMPLANT
SYSTEM BAG RETRIEVAL 10MM (BASKET) IMPLANT
TROCAR ADV FIXATION 12X100MM (TROCAR) ×4 IMPLANT
TROCAR Z-THREAD FIOS 5X100MM (TROCAR) IMPLANT
WATER STERILE IRR 1000ML POUR (IV SOLUTION) ×4 IMPLANT

## 2024-02-04 NOTE — H&P (Signed)
 [name] [name] is an 89 y.o. male.    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 Follows Dr. Creta Levin nephrology.   3 - Lower Urinary Tract Symptoms - on finasteride + Myrbetriq at baseline, prior channel TURP. PVR <175mL x many.   PMH sig for knee surgery, does follow with [facility] but normal stress tests, no blood thinners. 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 Widowed, lives independanly in [place], kids live very close. His PCP is [name] MD   Today "[name] [name]" is seen to proceed with cystoprostatectomy for refractory high grade bladder cancer. No interval fevers. Still somewhat anemic with Hgb 10.8. Cr 4.5 (baseline abtou 3), but acceptable K and volume status. He complete bowel prep to clear.   Past Medical History:  Diagnosis Date   Anemia    Arthritis    lower back bulging disc, hips, knees, thumbs, shoulder   Asthma    as a pre teen   Bladder cancer (HCC) UROLOGIST-  DR MCKENZIE   S/P TURBT 08-16-2017   Bladder wall hemorrhage 09/28/2023   BPH (benign prostatic hyperplasia)    CKD (chronic kidney disease), stage III (HCC)    STAGE 3  B CKD per lov 10-16-2020 dr Thyra Breed [facility] kidney on chart   Dyspnea    Fatigue    MIDDLE OF DAY ON OCCASION   Frequency of urination    GERD (gastroesophageal reflux disease)    on protonix   Hematuria    Hematuria    History of colon polyps    HLD  (hyperlipidemia)    HOH (hard of hearing)    slightly hoh right worse thanleft   HTN (hypertension)    Hypothyroidism    Incontinence of urine    weras pads all the time   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   Right Buccal Cheek   Pre-diabetes    Recurrent bladder papillary carcinoma (HCC) hx 2012 and 2014-- urologist- dr Ronne Binning   s/p  TURBT 08-16-2017  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   Renal mass 09/02/2022   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 10/02/2023   Sleep apnea    uses cpap set on 20 /4   Trigger finger of left hand 07/04/2021   last few months per pt   UTI (urinary tract infection) 08/20/2020   Weakness of extremity    left knee   Wears glasses     Past Surgical History:  Procedure Laterality Date   BCG X 2     AFTER LAST 2 BLADDER TUMOR REMOVALS   CATARACT EXTRACTION, BILATERAL     COLONSCOPY  YRS AGO   CYSTOSCOPY N/A 10/29/2020   Procedure: CYSTOSCOPY;  Surgeon: Noel Christmas, MD;  Location: [facility];  Service: Urology;  Laterality: N/A;   CYSTOSCOPY N/A 07/08/2021   Procedure: CYSTOSCOPY;  Surgeon: Noel Christmas, MD;  Location: [facility];  Service: Urology;  Laterality: N/A;   CYSTOSCOPY W/ RETROGRADES Right 07/21/2023   Procedure: CYSTOSCOPY WITH RIGHT RETROGRADE PYELOGRAM;  Surgeon: Loletta Parish., MD;  Location: [facility];  Service: Urology;  Laterality: Right;   CYSTOSCOPY W/ URETERAL STENT PLACEMENT Bilateral 08/16/2017   Procedure: CYSTOSCOPY WITH RETROGRADE PYELOGRAM/URETERAL STENT PLACEMENT;  Surgeon: Malen Gauze, MD;  Location: [facility];  Service: Urology;  Laterality: Bilateral;   CYSTOSCOPY W/ URETERAL STENT PLACEMENT Left 09/02/2022   Procedure: CYSTOSCOPY WITH RETROGRADE PYELOGRAM, URETERAL STENT PLACEMENT;  Surgeon: Sebastian Ache, MD;  Location: [facility];  Service: Urology;  Laterality: Left;   CYSTOSCOPY WITH BIOPSY N/A  06/25/2020   Procedure: CYSTOSCOPY FULGURATION   BLADDER  BIOPSY;  Surgeon: Noel Christmas, MD;  Location: [facility];  Service: Urology;  Laterality: N/A;   CYSTOSCOPY WITH FULGERATION N/A 12/08/2019   Procedure: CYSTOSCOPY WITH FULGERATION;  Surgeon: Malen Gauze, MD;  Location[facility];  Service: Urology;  Laterality: N/A;   CYSTOSCOPY WITH FULGERATION N/A 03/24/2022   Procedure: CYSTOSCOPY WITH FULGERATION;  Surgeon: Noel Christmas, MD;  Location: [facility];  Service: Urology;  Laterality: N/A;   CYSTOSCOPY WITH RETROGRADE PYELOGRAM, URETEROSCOPY AND STENT PLACEMENT Left 03/24/2022   Procedure: CYSTOSCOPY WITH RETROGRADE PYELOGRAM, DIAGNOSTIC URETEROSCOPY WITH BIOPSY AND STENT PLACEMENT;  Surgeon: Noel Christmas, MD;  Location: [facility];  Service: Urology;  Laterality: Left;  90 MINUTES   CYSTOSCOPY WITH RETROGRADE PYELOGRAM, URETEROSCOPY AND STENT PLACEMENT Left 04/07/2022   Procedure: CYSTOSCOPY WITH  RETROGRADE PYELOGRAM, DIAGNOSTIC URETEROSCOPY WITH BIOPSY AND STENT EXCHANGE;  Surgeon: Noel Christmas, MD;  Location: [facility];  Service: Urology;  Laterality: Left;  75 MINUTES   CYSTOSCOPY WITH RETROGRADE PYELOGRAM, URETEROSCOPY AND STENT PLACEMENT Left 08/11/2022   Procedure: CYSTOSCOPY WITH RETROGRADE PYELOGRAM, ANTEGRADE NEPHROSTOGRAM, DIAGNOSTIC  URETEROSCOPY;  Surgeon: Noel Christmas, MD;  Location: [facility];  Service: Urology;  Laterality: Left;  90 MINS   HOLMIUM LASER APPLICATION Left 08/11/2022   Procedure: HOLMIUM LASER APPLICATION;  Surgeon: Noel Christmas, MD;  Location: [facility];  Service: Urology;  Laterality: Left;   IR NEPHROSTOMY EXCHANGE LEFT  05/15/2022   IR NEPHROSTOMY EXCHANGE LEFT  07/10/2022   IR NEPHROSTOMY PLACEMENT LEFT  05/06/2022   KNEE ARTHROSCOPY Left 06/10/2011   LYMPH NODE DISSECTION Left 09/02/2022   Procedure: RETROPERITONEAL LYMPH NODE DISSECTION;  Surgeon: Sebastian Ache, MD;  Location: [facility];  Service: Urology;   Laterality: Left;   PROSTATE BIOPSY  2000   ROBOT ASSITED LAPAROSCOPIC NEPHROURETERECTOMY Left 09/02/2022   Procedure: XI ROBOT ASSITED LAPAROSCOPIC NEPHROURETERECTOMY;  Surgeon: Sebastian Ache, MD;  Location: [facility];  Service: Urology;  Laterality: Left;   TRANSURETHRAL RESECTION OF BLADDER TUMOR N/A 08/16/2017   Procedure: TRANSURETHRAL RESECTION OF BLADDER TUMOR (TURBT);  Surgeon: Malen Gauze, MD;  Location[facility];  Service: Urology;  Laterality: N/A;   TRANSURETHRAL RESECTION OF BLADDER TUMOR  12-09-2012   dr Marcelyn Bruins [facility]   and TURP   TRANSURETHRAL RESECTION OF BLADDER TUMOR N/A 09/20/2017   Procedure: TRANSURETHRAL RESECTION OF BLADDER TUMOR (TURBT);  Surgeon: Malen Gauze, MD;  Location: [facility];  Service: Urology;  Laterality: N/A;   TRANSURETHRAL RESECTION OF BLADDER TUMOR N/A 12/08/2019   Procedure: TRANSURETHRAL RESECTION OF BLADDER TUMOR;  Surgeon: Malen Gauze, MD;  Location: [facility];  Service: Urology;  Laterality: N/A;  1 HR   TRANSURETHRAL RESECTION OF BLADDER TUMOR N/A  10/29/2020   Procedure: BLADDER FULGERATION;  Surgeon: Noel Christmas, MD;  Location: [facility];  Service: Urology;  Laterality: N/A;  30 MINS   TRANSURETHRAL RESECTION OF BLADDER TUMOR N/A 07/08/2021   Procedure: TRANSURETHRAL RESECTION OF BLADDER TUMOR (TURBT)/FULGERATION;  Surgeon: Noel Christmas, MD;  Location: [facility];  Service: Urology;  Laterality: N/A;  45 MINS   TRANSURETHRAL RESECTION OF BLADDER TUMOR N/A 12/02/2021   Procedure: TRANSURETHRAL RESECTION OF BLADDER TUMOR (TURBT);  Surgeon: Noel Christmas, MD;  Location: [facility];  Service: Urology;  Laterality: N/A;   TRANSURETHRAL RESECTION OF BLADDER TUMOR N/A 07/21/2023   Procedure: TRANSURETHRAL RESECTION OF BLADDER TUMOR (TURBT);  Surgeon: Loletta Parish., MD;  Location: [facility];  Service: Urology;  Laterality: N/A;  60  MINUTES   TRANSURETHRAL RESECTION OF BLADDER TUMOR N/A 10/01/2023   Procedure: TRANSURETHRAL RESECTION OF BLADDER TUMOR (TURBT)/CLOT EVACUATION WITH FULGERATION;  Surgeon: Loletta Parish., MD;  Location: [facility];  Service: Urology;  Laterality: N/A;   TRANSURETHRAL RESECTION OF PROSTATE  2011   UPPER GI ENDOSCOPY  YRS AGO    Family History  Problem Relation Age of Onset   Hypertension Mother    Breast cancer Mother    Other Mother        brain tumor   Alzheimer's disease Mother    Emphysema Father        smoker   Social History:  reports that he has never smoked. He has never used smokeless tobacco. He reports that he does not currently use alcohol after a past usage of about 1.0 - 2.0 standard drink of alcohol per week. He reports that he does not use drugs.  Allergies:  Allergies  Allergen Reactions   Macrobid [Nitrofurantoin] Shortness Of Breath   Ciprofloxacin Diarrhea and Other (See Comments)    Occurred with 750 mg dose. Has since taken 500 mg doses and has had no reaction/diarrhea.   Keytruda [Pembrolizumab] Other (See Comments)    Weakness in both legs and fatigue   Tramadol Hcl Other (See Comments)    Pt unsure of reaction    Cephalexin Rash and Other (See Comments)    Rash on arms   [name] [Mitomycin] Rash and Other (See Comments)    All over body RASH; possibly triggered kidney REMOVAL and RESTLESS LEGS also    Medications Prior to Admission  Medication Sig Dispense Refill   amLODipine (NORVASC) 10 MG tablet Take 1 tablet (10 mg total) by mouth daily. (Patient taking differently: Take 10 mg by mouth at bedtime.) 90 tablet 1   ferrous sulfate 325 (65 FE) MG tablet Take 325 mg by mouth daily with supper.     finasteride (PROSCAR) 5 MG tablet TAKE ONE TABLET BY MOUTH DAILY (Patient taking differently: Take 5 mg by mouth at bedtime.) 90 tablet 3   levothyroxine (SYNTHROID, LEVOTHROID) 50 MCG tablet Take 50 mcg by mouth daily before breakfast.     MYRBETRIQ 50 MG  TB24 tablet Take 50 mg by mouth at bedtime.     predniSONE (DELTASONE) 10 MG tablet Start at 30 mg today, continue daily for 1 week, followed by 20 mg daily until follow up 50 tablet 0   psyllium (METAMUCIL) 58.6 % powder Take 0.5 packets by mouth every evening.     rosuvastatin (CRESTOR) 5 MG tablet Take 5 mg by mouth at bedtime.     senna-docusate (SENOKOT-S) 8.6-50 MG tablet Take 1 tablet by mouth 2 (two)  times daily. While taking strong pain meds to prevent constipation (Patient taking differently: Take 1 tablet by mouth 2 (two) times daily as needed for moderate constipation. While taking strong pain meds to prevent constipation) 10 tablet 0   silodosin (RAPAFLO) 8 MG CAPS capsule Take 1 capsule (8 mg total) by mouth at bedtime. 90 capsule 3   sodium bicarbonate 650 MG tablet Take 650 mg by mouth See admin instructions. Take 650 mg by mouth at noon and midnight     TYLENOL 8 HOUR ARTHRITIS PAIN 650 MG CR tablet Take 1,300 mg by mouth every 8 (eight) hours as needed for pain.     pantoprazole (PROTONIX) 20 MG tablet Take 20 mg by mouth at bedtime. (Patient not taking: Reported on 01/14/2024)      Results for orders placed or performed during the hospital encounter of 02/03/24 (from the past 48 hours)  CBC     Status: Abnormal   Collection Time: 02/03/24  1:34 PM  Result Value Ref Range   WBC 12.8 (H) 4.0 - 10.5 K/uL   RBC 3.43 (L) 4.22 - 5.81 MIL/uL   Hemoglobin 10.3 (L) 13.0 - 17.0 g/dL   HCT 40.9 (L) 81.1 - 91.4 %   MCV 97.1 80.0 - 100.0 fL   MCH 30.0 26.0 - 34.0 pg   MCHC 30.9 30.0 - 36.0 g/dL   RDW 78.2 (H) 95.6 - 21.3 %   Platelets 194 150 - 400 K/uL   nRBC 0.0 0.0 - 0.2 %    Comment: Performed at [facility], [address], [place], [place] [address]  Comprehensive metabolic panel     Status: Abnormal   Collection Time: 02/03/24  1:34 PM  Result Value Ref Range   Sodium 139 135 - 145 mmol/L   Potassium 5.0 3.5 - 5.1 mmol/L   Chloride 109 98 - 111 mmol/L    CO2 17 (L) 22 - 32 mmol/L   Glucose, Bld 245 (H) 70 - 99 mg/dL    Comment: Glucose reference range applies only to samples taken after fasting for at least 8 hours.   BUN 79 (H) 8 - 23 mg/dL   Creatinine, Ser 8.46 (H) 0.61 - 1.24 mg/dL   Calcium 8.9 8.9 - 96.2 mg/dL   Total Protein 6.4 (L) 6.5 - 8.1 g/dL   Albumin 3.5 3.5 - 5.0 g/dL   AST 19 15 - 41 U/L   ALT 31 0 - 44 U/L   Alkaline Phosphatase 51 38 - 126 U/L   Total Bilirubin 0.6 0.0 - 1.2 mg/dL   GFR, Estimated 12 (L) >60 mL/min    Comment: (NOTE) Calculated using the CKD-EPI Creatinine Equation (2021)    Anion gap 13 5 - 15    Comment: Performed at [facility], [address], [place], [place] [address]  Type and screen     Status: None   Collection Time: 02/03/24  1:34 PM  Result Value Ref Range   ABO/RH(D) A POS    Antibody Screen NEG    Sample Expiration      02/06/2024,2359 Performed at [facility], [address] [place], [place] [address]    No results found.  Review of Systems  Constitutional:  Negative for chills and fever.  All other systems reviewed and are negative.   Blood pressure 133/80, pulse 80, temperature 97.9 F (36.6 C), temperature source Oral, resp. rate 20, height 5\' 8"  (1.727 m), weight 77.1 kg, SpO2 99%. Physical  Exam Constitutional:      Comments: Very mentally spry and vigorous for age, at baseline.   HENT:     Head: Normocephalic.  Eyes:     Pupils: Pupils are equal, round, and reactive to light.  Cardiovascular:     Rate and Rhythm: Normal rate.  Pulmonary:     Effort: Pulmonary effort is normal.  Abdominal:     General: Abdomen is flat.     Comments: Prior scars noted, stomal marking site noted  Genitourinary:    Comments: No CVAT at present Musculoskeletal:        General: Normal range of motion.     Cervical back: Normal range of motion.  Skin:    General: Skin is warm.  Neurological:     General: No focal deficit present.      Mental Status: He is alert.  Psychiatric:        Mood and Affect: Mood normal.      Assessment/Plan  Proceed as planned with cysto/ICG, robotic cystoprostatectomy / node dissection and Rt cutaneous ureterostomy. He has very realistic expectations and understanding that his advanced age and comorbidity increases risk of ALL peri-op complications including mortality.   Loletta Parish., MD 02/04/2024, 6:48 AM

## 2024-02-04 NOTE — Op Note (Signed)
 [name] [name], [name] [name] MEDICAL RECORD NO: [id number] ACCOUNT NO: [id number] DATE OF BIRTH: 05/01/1932 FACILITY: [facility] LOCATION: [id number] PHYSICIAN: Sebastian Ache, MD  Operative Report   DATE OF PROCEDURE: 02/04/2024  PREOPERATIVE DIAGNOSIS:  Recurrent medication refractory high-grade bladder cancer and solitary right kidney.  PROCEDURE PERFORMED: 1.  Cystoscopy with injection of Indocyanine green dye. 2.  Laparoscopic radical cystectomy, with prostatectomy, bilateral pelvic lymph node dissection, right cutaneous ureterostomy, bilateral pelvic sentinel lymph node mapping.  ESTIMATED BLOOD LOSS:  200 mL.  COMPLICATIONS:  None.  SPECIMENS: 1. Left external iliac lymph nodes. 2.  Left obturator lymph nodes. 3.  Right external iliac lymph nodes. 4.  Right common iliac lymph nodes. 5.  Right obturator lymph node sentinel. 6.  Right frozen section ureteral margin negative for overt carcinoma. 7.  Final right ureteral margin. 8.  Bladder plus prostate en bloc.  ASSISTANT:  Harrie Foreman, PA  DRAINS: 1.  Jackson-Pratt drain to bulb suction. 2.  Right-sided cutaneous ureterostomy to gravity drainage with red bander stent.  FINDINGS: 1.  Recurrent papillary and nodular-appearing tumor in the bladder neck area on cystoscopy. 2.  Sentinel and lymphatic channels on the right hemipelvis, none visualized on the left.  Sentinel lymph nodes within the right obturator pocket.  INDICATIONS:  The patient is a very pleasant and incredibly vigorous 89 year old man with a longstanding history of multifocal urothelial carcinoma.  He is status post left nephroureterectomy several years ago for refractory left lower tract disease, but  he elected to defer cystectomy at that point.  He unfortunately has had recurrent high-grade urothelial carcinoma of his bladder since then and this is becoming more problematic symptomatically.  He underwent a trial of bladder preservation with  Keytruda; however, he  tolerated this quite poorly and had significant adverse reactions requiring hospitalization and steroid taper.  Most recent restaging imaging is clinically localized.  I had a detailed discussion with myself and our medical oncology  colleagues.  We discussed further options including curative intent, cystoprostatectomy and he adamantly wishes to proceed.  He has good functional status for age.  He is felt to be a suitable candidate.  We discussed types of urinary diversion  including the simplest in his case being right cutaneous ureterostomy and he wished to have this.  He was admitted yesterday for bowel prep, sterile marking and labs, which are acceptable.  Informed consent was obtained and placed in medical record.  DESCRIPTION OF PROCEDURE:  The patient is being verified and identified, procedure being cysto, injection of ICG dye, sentinel lymph node mapping bilateral, cystoprostatectomy, node dissection, percutaneous ureterostomy was confirmed.  Procedure time out  was performed.  Intravenous antibiotics were administered.  General endotracheal anesthesia induced.  The patient was placed into a low lithotomy position.  Sterile field was created, prepped and draped the patient's penis, perineum, and proximal thighs  using iodine and his infra-xiphoid abdomen using chlorhexidine gluconate after clipper shaving.  He was further fashioned to the operating table using 3-inch tape over foam padding across the supraxiphoid chest and his arm tucked to the side with gel  rolls.  A test of steep Trendelenburg positioning was performed and found to be suitably positioned.  An LAVH type of drape was placed.  Initial attention was directed at cystoscopy and injection of ICG dye.  Cystourethroscopy was performed using a  24-French injection scope set with a 0-degree lens.  Inspection of anterior and posterior urethra only revealed a prior TURP defect in his prostate.  Inspection  of urinary bladder did reveal a  small amount of clot in the bladder and some recurrent  papillary and nodular component tumor mostly in the bladder neck area, more in the right than the left.  Next, 2 mL of Indocyanine green dye was injected across several submucosal blebs in the area of the bladder neck tumor for sentinel lymph  angiography, and a silicone type Foley catheter was placed per urethra to straight drain.  Next, a high flow, low-pressure pneumoperitoneum was then obtained using Veress technique in the supraumbilical midline, having passed the aspiration and drop  test, an 8-mm robotic camera port was placed in same location.  Laparoscopic examination of the peritoneal cavity revealed no significant adhesions.  No visceral injury.  He does have a known small ventral hernia in the area well away from the entry  site.  Visualization laparoscopically showed no signs of strangulation.  Additional ports were placed as follows: Right paramedian 8 mm robotic port, right far lateral 12 mm AirSeal assist port, right paramedian 15 mm assistant port at previously marked  stomal site, left paramedian 8-mm robotic port, left far lateral 8-mm robotic port.  Robot was docked and passed the electrocautery checks.  Initial incision was directed at left retroperitoneal dissection.  An incision was made lateral to the descending  colon from the area of the iliac vessel superiorly for a distance at approximately 10 cm and then inferiorly coursing lateral to the left umbilical ligament towards the area of the anterior abdominal wall.  This created a large retroperitoneal flap on  the left side, which was retracted medially.  The iliac vessels were encountered.  There was some desmoplasia in this area consistent with his prior dissection.  Dissection proceeded down to the ureterovesical junction.  There was a minimal amount of  ureteral stump left and this was dissected to be included with the cystoprostatectomy specimen.  The lateral bladder wall  was placed in the pelvic side wall towards the area of the endopelvic fascia on the left side and endopelvic fascia was swept away  from the prostate towards area to apex.  This exposed the left side pelvic lymph node fields.  Sentinel lymph angiography on the left side revealed excellent parenchymal uptake of the bladder and prostate, but no obvious lymph nodes on the left side.  As such, lymphadenectomy was performed in a limited fashion of the left external iliac group with  the boundaries being left external iliac artery, vein, pelvic sidewall, lymphostasis achieved with cold clips set aside and labeled as left external iliac nodes. The left obturator group the boundaries being left external iliac vein, pelvic sidewall,  obturator nerve and lymphostasis achieved with cold clips.  There was a paucity of tissue over the internal iliac or common iliac areas as this had already been previously dissected.  Attention was directed to the right-sided retroperitoneal  dissection.   This was made lateral to the ascending colon in the area of the cecum superiorly to approximately 15 cm and distally coursing lateral to right medial umbilical ligament towards the area of the anterior abdominal wall. This created a large right-sided  retroperitoneal flap, which was retracted medially.  The right ureter was encountered coursing the iliac vessels as a marked vessel loop dissected proximally to position approximately 3 cm superior to the gonadal crossing.  Then distally to the  ureterovesicular junction which was doubly clipped and ligated.  Proximal clip containing a white tag suture was dissection negative for carcinoma, right ureter  stuck out of true pelvis.  This exposed lymphatic fields on the right side and sentinel lymph  angiography was performed there.  Sentinel lymph angiography on the right side revealed again excellent parenchymal uptake of the bladder and prostate.  There was a single dominant lymphatic  channel noted coursing towards the right obturator group what appeared to be a single-dominant  sentinel lymph node was noted.  As such, the right sentinel obturator node was dissected free.  The lymphostasis achieved with cold clips set aside and labeled as such.  The right obturator nerve was inspected and found to be uninjured.  Next, the right  external iliac group was dissected free as was the right common iliac lymph nodes.  The endopelvic fascia was swept away from the lateral aspect of the prostate in the base of orientation on the right side.  This completed the right lateral dissection.   Posterior dissection was performed by performing an inverted U incision in the peritoneum behind the bladder, creating a posterior peritoneal flap.  This plane was dissected distally all the way to the apex of the prostate, which was noted by anterior  curvature.  This provided excellent exposure of the bilateral pedicles of the bladder and prostate, which was controlled using white load stapler x2 each side.  Anterior dissection was performed by developing space of Retzius in the medial umbilical  ligaments towards the area of the prostate.  The dorsal venous complex was visualized controlling the ureteral stapler.  Final apical dissection was performed by placing the bladder and prostate specimen on superior traction, transecting the membranous  urethra coldly, doubly clipping and ligating the in situ Foley catheter, which was used as a bucket handle and the posterior aspect of the membranous urethra was transected.  This completely freed up the bladder and prostate specimen, which was placed  into the EndoCatch bag for later retrieval.    There was no evidence of rectal violation.  The membranous urethral stump was oversewn using running 3-0 V-loc to prevent prolonged pelvic drainage.  We achieved  the goals of extirpated portion of the procedure today. Sponge and needle counts were correct.  Hemostasis was  quite good.  A closed suction drain was brought through previous left lateral muscle robotic into the peritoneal cavity.  The specimen bag  string was brought to the left paramedian robotic port site.  This lie completed to the left of the midline and the right ureter tag suture was self-locking grasper via the 15-mm port, which was well to the right of the midline.  The robot was then  undocked.  The specimen was retrieved by extending the previous camera port site inferiorly for total distance approximately 4 cm removing the bladder and prostate specimen setting aside for permanent pathology.  Via extraction site, the right ureter was  grasped on its tag suture.  The 15-mm port site was dilated to accommodate the surgeon's thumb and a Y-shaped incision was made on this at the level of the skin and the ureter was brought through this very carefully, trimmed to length with the final  sections set aside and inked.  The ureter was spatulated. Total distance approximately 1.5 cm and percutaneous ureterostomy was performed in a heel and toe fashion using 4-0 Vicryl.  A red-colored Bander stent was placed 25 cm in anastomosis and anchored  in place using an interrupted purposely Air Knot Ethilon.  The specimen site was closed at the level of the fascia using a figure-of-eight PDS  x5 following reapproximation of Scarpa's running Vicryl.  All incision sites were infiltrated with dilute  lipolyzed Marcaine and closed at the level of skin using subcuticular Monocryl followed by Dermabond.  Procedure was then terminated.  The patient tolerated the procedure well.  No immediate periprocedural complications.  The patient taken to  postanesthesia care unit in stable condition.  Plan for continued inpatient admission.  Please note for assistant Harrie Foreman was crucial for all portions of surgery today she provided valuable retraction, suctioning, vascular clipping, vascular stapling, lymphatic clipping, specimen  manipulation, robotic instrument exchange, general first  assistance.   PUS D: 02/04/2024 2:10:43 pm T: 02/04/2024 7:48:00 pm  JOB: [id number]/ [id number]

## 2024-02-04 NOTE — Progress Notes (Signed)
   02/04/24 1233  TOC Brief Assessment  Insurance and Status Reviewed  Patient has primary care physician Yes  Home environment has been reviewed Resides alone in single family home  Prior level of function: Independent with ADLs at baseline  Prior/Current Home Services No current home services  Social Drivers of Health Review SDOH reviewed no interventions necessary  Readmission risk has been reviewed Yes  Transition of care needs no transition of care needs at this time

## 2024-02-04 NOTE — Anesthesia Procedure Notes (Signed)
 Procedure Name: Intubation Date/Time: 02/04/2024 11:04 AM  Performed by: Ponciano Ort, CRNAPre-anesthesia Checklist: Patient identified, Emergency Drugs available, Suction available and Patient being monitored Patient Re-evaluated:Patient Re-evaluated prior to induction Oxygen Delivery Method: Circle system utilized Preoxygenation: Pre-oxygenation with 100% oxygen Induction Type: IV induction Ventilation: Mask ventilation without difficulty Laryngoscope Size: Mac and 4 Grade View: Grade I Tube type: Oral Tube size: 7.5 mm Number of attempts: 1 Airway Equipment and Method: Stylet and Oral airway Placement Confirmation: ETT inserted through vocal cords under direct vision, positive ETCO2 and breath sounds checked- equal and bilateral Secured at: 23 cm Tube secured with: Tape Dental Injury: Teeth and Oropharynx as per pre-operative assessment

## 2024-02-04 NOTE — Brief Op Note (Signed)
 02/03/2024 - 02/04/2024  1:56 PM  PATIENT:  [name] [name]  89 y.o. male  PRE-OPERATIVE DIAGNOSIS:  RECURRENT BLADDER CANCER, SOLITARY RIGHT KIDNEY  POST-OPERATIVE DIAGNOSIS:  RECURRENT BLADDER CANCER, SOLITARY RIGHT KIDNEY  PROCEDURE:  Procedure(s) with comments: ROBOT ASSISTED LAPAROSCOPIC RADICAL CYSTOPROSTATECTOMY (N/A) - 300 MINUTES NEEDED FOR CASE LYMPHADENECTOMY, PELVIS, ROBOT-ASSISTED (N/A) CYSTOSCOPY WITH ICG (N/A) RIGHT CUTANEOUS URETEROSCOPY (Right)  SURGEON:  Surgeons and Role:    * Manny, Delbert Phenix., MD - Primary  PHYSICIAN ASSISTANT:   ASSISTANTS: Harrie Foreman PA   ANESTHESIA:   local and general  EBL:  150 mL   BLOOD ADMINISTERED:none  DRAINS:  1 - JP to bulb; 2 - Foley to gravity    LOCAL MEDICATIONS USED:  MARCAINE     SPECIMEN:  Source of Specimen:  1 - Bladder + rpostate; 2 -Rt ureteral margin; 3 - Pelvic lymph nodes  DISPOSITION OF SPECIMEN:  PATHOLOGY  COUNTS:  YES  TOURNIQUET:  * No tourniquets in log *  DICTATION: .Other Dictation: Dictation Number [id number]  PLAN OF CARE: Admit to inpatient   PATIENT DISPOSITION:  PACU - hemodynamically stable.   Delay start of Pharmacological VTE agent (>24hrs) due to surgical blood loss or risk of bleeding: yes

## 2024-02-04 NOTE — Anesthesia Preprocedure Evaluation (Addendum)
 Anesthesia Evaluation  Patient identified by MRN, date of birth, ID band Patient awake    Reviewed: Allergy & Precautions, NPO status , Patient's Chart, lab work & pertinent test results  Airway Mallampati: II  TM Distance: >3 FB Neck ROM: Full    Dental no notable dental hx.    Pulmonary asthma , sleep apnea    Pulmonary exam normal        Cardiovascular hypertension, Pt. on medications + dysrhythmias  Rhythm:Regular Rate:Normal     Neuro/Psych   Anxiety     negative neurological ROS     GI/Hepatic Neg liver ROS,GERD  Medicated,,  Endo/Other  Hypothyroidism    Renal/GU CRFRenal disease Bladder dysfunction      Musculoskeletal  (+) Arthritis , Osteoarthritis,    Abdominal Normal abdominal exam  (+)   Peds  Hematology  (+) Blood dyscrasia, anemia Lab Results      Component                Value               Date                      WBC                      12.8 (H)            02/03/2024                HGB                      10.3 (L)            02/03/2024                HCT                      33.3 (L)            02/03/2024                MCV                      97.1                02/03/2024                PLT                      194                 02/03/2024             Lab Results      Component                Value               Date                      NA                       139                 02/03/2024                K  5.0                 02/03/2024                CO2                      17 (L)              02/03/2024                GLUCOSE                  245 (H)             02/03/2024                BUN                      79 (H)              02/03/2024                CREATININE               4.50 (H)            02/03/2024                CALCIUM                  8.9                 02/03/2024                GFRNONAA                 12 (L)              02/03/2024               Anesthesia Other Findings   Reproductive/Obstetrics                             Anesthesia Physical Anesthesia Plan  ASA: 3  Anesthesia Plan: General   Post-op Pain Management:    Induction: Intravenous  PONV Risk Score and Plan: 2 and Ondansetron, Dexamethasone and Treatment may vary due to age or medical condition  Airway Management Planned: Mask and Oral ETT  Additional Equipment: Arterial line  Intra-op Plan:   Post-operative Plan: Possible Post-op intubation/ventilation  Informed Consent: I have reviewed the patients History and Physical, chart, labs and discussed the procedure including the risks, benefits and alternatives for the proposed anesthesia with the patient or authorized representative who has indicated his/her understanding and acceptance.     Dental advisory given  Plan Discussed with: CRNA  Anesthesia Plan Comments:        Anesthesia Quick Evaluation

## 2024-02-04 NOTE — Discharge Instructions (Signed)

## 2024-02-04 NOTE — Anesthesia Postprocedure Evaluation (Signed)
 Anesthesia Post Note  Patient: [name] [name]  Procedure(s) Performed: ROBOT ASSISTED LAPAROSCOPIC RADICAL CYSTOPROSTATECTOMY (Abdomen) LYMPHADENECTOMY, PELVIS, ROBOT-ASSISTED (Pelvis) CYSTOSCOPY WITH ICG (Pelvis) RIGHT CUTANEOUS URETEROSCOPY (Right: Pelvis)     Patient location during evaluation: PACU Anesthesia Type: General Level of consciousness: awake and alert Pain management: pain level controlled Vital Signs Assessment: post-procedure vital signs reviewed and stable Respiratory status: spontaneous breathing, nonlabored ventilation, respiratory function stable and patient connected to nasal cannula oxygen Cardiovascular status: blood pressure returned to baseline and stable Postop Assessment: no apparent nausea or vomiting Anesthetic complications: no   No notable events documented.  Last Vitals:  Vitals:   02/04/24 1553 02/04/24 1614  BP:  (!) 121/57  Pulse: 78 78  Resp: 16 14  Temp:  (!) 36.4 C  SpO2: 100% 100%    Last Pain:  Vitals:   02/04/24 1713  TempSrc:   PainSc: 6                  Roston Grunewald P Gatlin Kittell

## 2024-02-04 NOTE — Anesthesia Procedure Notes (Signed)
 Arterial Line Insertion Start/End3/14/2025 10:00 AM, 02/04/2024 10:10 AM Performed by: [place], Stephanie C, CRNA  Patient location: Pre-op. Preanesthetic checklist: patient identified, IV checked, site marked, risks and benefits discussed, surgical consent, monitors and equipment checked, pre-op evaluation, timeout performed and anesthesia consent Lidocaine 1% used for infiltration and patient sedated radial was placed Catheter size: 20 G Hand hygiene performed  and maximum sterile barriers used   Attempts: 1 Procedure performed without using ultrasound guided technique. Following insertion, dressing applied. Post procedure assessment: normal and unchanged

## 2024-02-04 NOTE — Transfer of Care (Signed)
 Immediate Anesthesia Transfer of Care Note  Patient: [name] [name]  Procedure(s) Performed: ROBOT ASSISTED LAPAROSCOPIC RADICAL CYSTOPROSTATECTOMY (Abdomen) LYMPHADENECTOMY, PELVIS, ROBOT-ASSISTED (Pelvis) CYSTOSCOPY WITH ICG (Pelvis) RIGHT CUTANEOUS URETEROSCOPY (Right: Pelvis)  Patient Location: PACU  Anesthesia Type:General  Level of Consciousness: awake, alert , oriented, and patient cooperative  Airway & Oxygen Therapy: Patient Spontanous Breathing and Patient connected to face mask oxygen  Post-op Assessment: Report given to RN and Post -op Vital signs reviewed and stable  Post vital signs: Reviewed and stable  Last Vitals:  Vitals Value Taken Time  BP 118/45 02/04/24 1405  Temp    Pulse 74 02/04/24 1414  Resp 20 02/04/24 1414  SpO2 100 % 02/04/24 1413  Vitals shown include unfiled device data.  Last Pain:  Vitals:   02/04/24 0859  TempSrc:   PainSc: 0-No pain         Complications: No notable events documented.

## 2024-02-05 ENCOUNTER — Encounter (HOSPITAL_COMMUNITY): Payer: Self-pay | Admitting: Urology

## 2024-02-05 LAB — BASIC METABOLIC PANEL
Anion gap: 7 (ref 5–15)
BUN: 54 mg/dL — ABNORMAL HIGH (ref 8–23)
CO2: 19 mmol/L — ABNORMAL LOW (ref 22–32)
Calcium: 7.9 mg/dL — ABNORMAL LOW (ref 8.9–10.3)
Chloride: 113 mmol/L — ABNORMAL HIGH (ref 98–111)
Creatinine, Ser: 3.23 mg/dL — ABNORMAL HIGH (ref 0.61–1.24)
GFR, Estimated: 18 mL/min — ABNORMAL LOW (ref >60)
Glucose, Bld: 82 mg/dL (ref 70–99)
Potassium: 4.2 mmol/L (ref 3.5–5.1)
Sodium: 139 mmol/L (ref 135–145)

## 2024-02-05 LAB — POCT I-STAT 7, (LYTES, BLD GAS, ICA,H+H)
Acid-base deficit: 12 mmol/L — ABNORMAL HIGH (ref 0.0–2.0)
Acid-base deficit: 9 mmol/L — ABNORMAL HIGH (ref 0.0–2.0)
Bicarbonate: 13 mmol/L — ABNORMAL LOW (ref 20.0–28.0)
Bicarbonate: 17.9 mmol/L — ABNORMAL LOW (ref 20.0–28.0)
Calcium, Ion: 1.06 mmol/L — ABNORMAL LOW (ref 1.15–1.40)
Calcium, Ion: 1.24 mmol/L (ref 1.15–1.40)
HCT: 29 % — ABNORMAL LOW (ref 39.0–52.0)
HCT: 27 % — ABNORMAL LOW (ref 39.0–52.0)
Hemoglobin: 9.9 g/dL — ABNORMAL LOW (ref 13.0–17.0)
Hemoglobin: 9.2 g/dL — ABNORMAL LOW (ref 13.0–17.0)
O2 Saturation: 100 %
O2 Saturation: 100 %
Potassium: 3.2 mmol/L — ABNORMAL LOW (ref 3.5–5.1)
Potassium: 3.9 mmol/L (ref 3.5–5.1)
Sodium: 146 mmol/L — ABNORMAL HIGH (ref 135–145)
Sodium: 141 mmol/L (ref 135–145)
TCO2: 14 mmol/L — ABNORMAL LOW (ref 22–32)
TCO2: 19 mmol/L — ABNORMAL LOW (ref 22–32)
pCO2 arterial: 26.9 mmHg — ABNORMAL LOW (ref 32–48)
pCO2 arterial: 44.7 mmHg (ref 32–48)
pH, Arterial: 7.292 — ABNORMAL LOW (ref 7.35–7.45)
pH, Arterial: 7.21 — ABNORMAL LOW (ref 7.35–7.45)
pO2, Arterial: 279 mmHg — ABNORMAL HIGH (ref 83–108)
pO2, Arterial: 206 mmHg — ABNORMAL HIGH (ref 83–108)

## 2024-02-05 LAB — HEMOGLOBIN AND HEMATOCRIT, BLOOD
HCT: 28.3 % — ABNORMAL LOW (ref 39.0–52.0)
Hemoglobin: 8.8 g/dL — ABNORMAL LOW (ref 13.0–17.0)

## 2024-02-05 MED ORDER — HEPARIN SODIUM (PORCINE) 5000 UNIT/ML IJ SOLN
5000.0000 [IU] | Freq: Two times a day (BID) | INTRAMUSCULAR | Status: DC
  Administered 2024-02-05 – 2024-02-08 (×7): 5000 [IU] via SUBCUTANEOUS
  Filled 2024-02-05 (×7): qty 1

## 2024-02-05 MED ORDER — ACETAMINOPHEN 10 MG/ML IV SOLN
1000.0000 mg | Freq: Four times a day (QID) | INTRAVENOUS | Status: AC
  Administered 2024-02-05 – 2024-02-06 (×4): 1000 mg via INTRAVENOUS
  Filled 2024-02-05 (×4): qty 100

## 2024-02-05 NOTE — Evaluation (Signed)
 Physical Therapy Evaluation Patient Details Name: [name] [name] MRN: [id number] DOB: 1931-12-19 Today's Date: 02/05/2024  History of Present Illness  89 yo male admitted with bladder Ca s/p lap residual cystectomy, prostatectomy, R ureterostomy 02/04/24. Hx of bladder + renal pelvis Ca, CKD, TURBT 07/2017  Clinical Impression  On eval, pt required CGA for mobility. He walked ~115 feet with a RW. Moderate pain with activity-rated 6/10 by patient. Pt has walked x 2 today. Encouraged him to continue mobilizing with nursing and/or mobility team as able. Discussed d/c plan-pt plans to return home. He is agreeable to HHPT f/u. Will continue to follow pt during this hospital stay.        If plan is discharge home, recommend the following: A little help with walking and/or transfers;A little help with bathing/dressing/bathroom;Assistance with cooking/housework;Assist for transportation;Help with stairs or ramp for entrance   Can travel by private vehicle        Equipment Recommendations None recommended by PT  Recommendations for Other Services       Functional Status Assessment Patient has had a recent decline in their functional status and demonstrates the ability to make significant improvements in function in a reasonable and predictable amount of time.     Precautions / Restrictions Precautions Precautions: Fall Precaution/Restrictions Comments: L jp drain, urostomy R Restrictions Weight Bearing Restrictions Per Provider Order: No      Mobility  Bed Mobility Overal bed mobility: Needs Assistance Bed Mobility: Supine to Sit     Supine to sit: HOB elevated, Used rails, Contact guard     General bed mobility comments: Increased time. Effortful and painful.    Transfers Overall transfer level: Needs assistance Equipment used: Rolling walker (2 wheels) Transfers: Sit to/from Stand Sit to Stand: Contact guard assist, From elevated surface           General transfer  comment: Cues for safety, hand placement. Increased time.    Ambulation/Gait Ambulation/Gait assistance: Contact guard assist Gait Distance (Feet): 115 Feet Assistive device: Rolling walker (2 wheels) Gait Pattern/deviations: Step-through pattern, Decreased stride length       General Gait Details: Slow gait speed. 1 brief instance of LOB while ambulating. Pt denied dizziness.  Stairs            Wheelchair Mobility     Tilt Bed    Modified Rankin (Stroke Patients Only)       Balance Overall balance assessment: Needs assistance         Standing balance support: Bilateral upper extremity supported, During functional activity, Reliant on assistive device for balance Standing balance-Leahy Scale: Poor                               Pertinent Vitals/Pain Pain Assessment Pain Assessment: 0-10 Pain Score: 6  Pain Location: abdomen Pain Descriptors / Indicators: Aching, Tightness Pain Intervention(s): Limited activity within patient's tolerance, Monitored during session, Repositioned    Home Living Family/patient expects to be discharged to:: Private residence Living Arrangements: Children;Other relatives (temporarily) Available Help at Discharge: Family Type of Home: House Home Access: Stairs to enter Entrance Stairs-Rails: Right Entrance Stairs-Number of Steps: 5 Alternate Level Stairs-Number of Steps: 8 Home Layout: Multi-level Home Equipment:  ("walking stick vs ski poles")      Prior Function Prior Level of Function : Independent/Modified Independent             Mobility Comments: recently using walking stick vs ski poles  when ambulating 2* balance deficits/R hip OA       Extremity/Trunk Assessment   Upper Extremity Assessment Upper Extremity Assessment: Overall WFL for tasks assessed    Lower Extremity Assessment Lower Extremity Assessment: Generalized weakness (chronic R hip OA/pain)    Cervical / Trunk Assessment Cervical  / Trunk Assessment: Normal  Communication   Communication Communication: No apparent difficulties    Cognition Arousal: Alert Behavior During Therapy: WFL for tasks assessed/performed   PT - Cognitive impairments: No apparent impairments                         Following commands: Intact       Cueing Cueing Techniques: Verbal cues     General Comments      Exercises     Assessment/Plan    PT Assessment Patient needs continued PT services  PT Problem List Decreased strength;Decreased range of motion;Decreased activity tolerance;Decreased balance;Decreased mobility;Decreased knowledge of use of DME;Pain       PT Treatment Interventions DME instruction;Gait training;Functional mobility training;Therapeutic activities;Therapeutic exercise;Balance training;Patient/family education    PT Goals (Current goals can be found in the Care Plan section)  Acute Rehab PT Goals Patient Stated Goal: regain plof/independence PT Goal Formulation: With patient Time For Goal Achievement: 02/19/24 Potential to Achieve Goals: Good    Frequency Min 2X/week     Co-evaluation               AM-PAC PT "6 Clicks" Mobility  Outcome Measure Help needed turning from your back to your side while in a flat bed without using bedrails?: A Little Help needed moving from lying on your back to sitting on the side of a flat bed without using bedrails?: A Little Help needed moving to and from a bed to a chair (including a wheelchair)?: A Little Help needed standing up from a chair using your arms (e.g., wheelchair or bedside chair)?: A Little Help needed to walk in hospital room?: A Little Help needed climbing 3-5 steps with a railing? : A Little 6 Click Score: 18    End of Session   Activity Tolerance: Patient tolerated treatment well Patient left: in chair;with call bell/phone within reach   PT Visit Diagnosis: Difficulty in walking, not elsewhere classified (R26.2);Pain     Time: 6578-4696 PT Time Calculation (min) (ACUTE ONLY): 27 min   Charges:   PT Evaluation $PT Eval Low Complexity: 1 Low PT Treatments $Gait Training: 8-22 mins PT General Charges $$ ACUTE PT VISIT: 1 Visit            Faye Ramsay, PT Acute Rehabilitation  Office: [phone]

## 2024-02-05 NOTE — Progress Notes (Signed)
 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 Follows Dr. Creta Levin nephrology. Urology Inpatient Progress Report  Intv/Subj: No acute events overnight.  Patient is without complaint. Up in a chair this morning.   Already worked with PT.  Tolerating full liquids.  Denies any nausea.  Pain very well-controlled.  Principal Problem:   Bladder cancer (HCC)  Current Facility-Administered Medications  Medication Dose Route Frequency Provider Last Rate Last Admin   0.9 %  sodium chloride infusion   Intravenous Continuous Harrie Foreman, PA-C 75 mL/hr at 02/05/24 1111 Infusion Verify at 02/05/24 1111   acetaminophen (OFIRMEV) IV 1,000 mg  1,000 mg Intravenous Q6H Crist Fat, MD       amLODipine (NORVASC) tablet 10 mg  10 mg Oral QHS Loletta Parish., MD   10 mg at 02/04/24 2252   diphenhydrAMINE (BENADRYL) injection 12.5 mg  12.5 mg Intravenous Q6H PRN Harrie Foreman, PA-C       Or   diphenhydrAMINE (BENADRYL) 12.5 MG/5ML elixir 12.5 mg  12.5 mg Oral Q6H PRN Harrie Foreman, PA-C       docusate sodium (COLACE) capsule 100 mg  100 mg Oral BID Dancy, Amanda, PA-C   100 mg at 02/05/24 1023   HYDROmorphone (DILAUDID) injection 0.5-1 mg  0.5-1 mg Intravenous Q3H PRN Harrie Foreman, PA-C   1 mg at 02/04/24 1755   levothyroxine (SYNTHROID) tablet 50 mcg  50 mcg Oral QAC  breakfast Loletta Parish., MD   50 mcg at 02/05/24 0653   ondansetron [facility]) injection 4 mg  4 mg Intravenous Q4H PRN Harrie Foreman, PA-C       oxyCODONE (Oxy IR/ROXICODONE) immediate release tablet 5 mg  5 mg Oral Q4H PRN Harrie Foreman, PA-C   5 mg at 02/05/24 0117   rosuvastatin (CRESTOR) tablet 5 mg  5 mg Oral QHS Loletta Parish., MD   5 mg at 02/04/24 2251     Objective: Vital: Vitals:   02/04/24 2252 02/05/24 0021 02/05/24 0510 02/05/24 0800  BP: 112/68 (!) 106/58 115/60 117/60  Pulse: 74 68 71 70  Resp: 18 18 18 18   Temp: (!) 97.4 F (36.3 C) (!) 97.5 F (36.4 C) 97.6 F (36.4 C) 97.9 F (36.6 C)  TempSrc: Oral Oral Oral Oral  SpO2: 98% 99% 96% 98%  Weight:      Height:       I/Os: I/O last 3 completed shifts: In: 4550 [P.O.:1440; I.V.:2760; IV Piggyback:350] Out: 1385 [Urine:870; Drains:215; Other:150; Blood:150]  Physical Exam:  General: Patient is in no apparent distress Lungs: Normal respiratory effort, chest expands symmetrically. GI: Incisions are c/d/i.  The abdomen is soft and nontender without mass. JP drain with serosanguinous drainage Ostomy draining blood-tinged urine with the stent emanating through into the bag. Ext: lower extremities symmetric  Lab Results: Recent Labs    02/03/24 1334 02/04/24 1430 02/05/24 0556  WBC  12.8*  --   --   HGB 10.3* 9.8* 8.8*  HCT 33.3* 30.0* 28.3*   Recent Labs    02/03/24 1334 02/05/24 0556  NA 139 139  K 5.0 4.2  CL 109 113*  CO2 17* 19*  GLUCOSE 245* 82  BUN 79* 54*  CREATININE 4.50* 3.23*  CALCIUM 8.9 7.9*   No results for input(s): "LABPT", "INR" in the last 72 hours. No results for input(s): "LABURIN" in the last 72 hours. Results for orders placed or performed during the hospital encounter of 02/03/24  Surgical pcr screen     Status: None   Collection Time: 02/04/24  8:08 AM   Specimen: Nasal Mucosa; Nasal Swab  Result Value Ref Range Status   MRSA, PCR NEGATIVE NEGATIVE Final    Staphylococcus aureus NEGATIVE NEGATIVE Final    Comment: (NOTE) The Xpert SA Assay (FDA approved for NASAL specimens in patients 57 years of age and older), is one component of a comprehensive surveillance program. It is not intended to diagnose infection nor to guide or monitor treatment. Performed at [facility], [address] [place], [place] [address]     Studies/Results: No results found.  Assessment: Procedure(s): ROBOT ASSISTED LAPAROSCOPIC RADICAL CYSTOPROSTATECTOMY LYMPHADENECTOMY, PELVIS, ROBOT-ASSISTED CYSTOSCOPY WITH ICG RIGHT CUTANEOUS URETEROSCOPY, 1 Day Post-Op  doing well.  Plan: Labs as expected.  Follow hemoglobin closely, transfuse if necessary.  Renal function actually improved today. Patient reasonably well-controlled.  Transitioning him to IV Tylenol for better pain control. Advance diet as tolerated Subcu heparin and ambulation prophylaxis No antibiotics necessary Continue physical therapy Expect the patient to be here through the weekend.   Berniece Salines, MD Urology 02/05/2024, 11:56 AM

## 2024-02-06 LAB — BASIC METABOLIC PANEL
Anion gap: 7 (ref 5–15)
BUN: 43 mg/dL — ABNORMAL HIGH (ref 8–23)
CO2: 18 mmol/L — ABNORMAL LOW (ref 22–32)
Calcium: 8.1 mg/dL — ABNORMAL LOW (ref 8.9–10.3)
Chloride: 112 mmol/L — ABNORMAL HIGH (ref 98–111)
Creatinine, Ser: 3.11 mg/dL — ABNORMAL HIGH (ref 0.61–1.24)
GFR, Estimated: 18 mL/min — ABNORMAL LOW (ref >60)
Glucose, Bld: 89 mg/dL (ref 70–99)
Potassium: 4.4 mmol/L (ref 3.5–5.1)
Sodium: 137 mmol/L (ref 135–145)

## 2024-02-06 LAB — HEMOGLOBIN AND HEMATOCRIT, BLOOD
HCT: 29.6 % — ABNORMAL LOW (ref 39.0–52.0)
Hemoglobin: 9.2 g/dL — ABNORMAL LOW (ref 13.0–17.0)

## 2024-02-06 MED ORDER — ACETAMINOPHEN 10 MG/ML IV SOLN
1000.0000 mg | Freq: Four times a day (QID) | INTRAVENOUS | Status: AC
  Administered 2024-02-06 – 2024-02-07 (×4): 1000 mg via INTRAVENOUS
  Filled 2024-02-06 (×4): qty 100

## 2024-02-06 NOTE — Progress Notes (Signed)
 Mobility Specialist - Progress Note   02/06/24 1442  Mobility  Activity Ambulated with assistance in hallway  Level of Assistance Standby assist, set-up cues, supervision of patient - no hands on  Assistive Device Front wheel walker  Distance Ambulated (ft) 500 ft  Range of Motion/Exercises Active  Activity Response Tolerated well  Mobility Referral Yes  Mobility visit 1 Mobility  Mobility Specialist Start Time (ACUTE ONLY) 1432  Mobility Specialist Stop Time (ACUTE ONLY) 1442  Mobility Specialist Time Calculation (min) (ACUTE ONLY) 10 min   Pt was found in bed and agreeable to ambulate. At EOS returned to bed with all needs met. Call bell in reach.  Billey Chang Mobility Specialist

## 2024-02-06 NOTE — Progress Notes (Signed)
 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 Follows Dr. Creta Levin nephrology. Urology Inpatient Progress Report  Intv/Subj: No acute events overnight.  Patient having some abdominal distention and has not yet passed gas.  He was having pain as a result of that and was given some IV Dilaudid. Has been up moving around, walked twice this morning already.  In general feels good. Denies any nausea/vomiting, has tolerated full liquids  Principal Problem:   Bladder cancer (HCC)  Current Facility-Administered Medications  Medication Dose Route Frequency Provider Last Rate Last Admin   amLODipine (NORVASC) tablet 10 mg  10 mg Oral QHS Loletta Parish., MD   10 mg at 02/05/24 2224   diphenhydrAMINE (BENADRYL) injection 12.5 mg  12.5 mg Intravenous Q6H PRN Harrie Foreman, PA-C       Or   diphenhydrAMINE (BENADRYL) 12.5 MG/5ML elixir 12.5 mg  12.5 mg Oral Q6H PRN Harrie Foreman, PA-C       docusate sodium (COLACE) capsule 100 mg  100 mg Oral BID Harrie Foreman, PA-C   100 mg at 02/06/24 0902   heparin injection 5,000 Units  5,000 Units Subcutaneous Q12H Crist Fat, MD   5,000 Units at 02/06/24 0902   HYDROmorphone (DILAUDID) injection 0.5-1 mg  0.5-1 mg Intravenous Q3H PRN Harrie Foreman, PA-C   1 mg at 02/06/24 0901   levothyroxine (SYNTHROID) tablet 50 mcg  50  mcg Oral QAC breakfast Loletta Parish., MD   50 mcg at 02/06/24 0517   ondansetron [facility]) injection 4 mg  4 mg Intravenous Q4H PRN Harrie Foreman, PA-C       oxyCODONE (Oxy IR/ROXICODONE) immediate release tablet 5 mg  5 mg Oral Q4H PRN Harrie Foreman, PA-C   5 mg at 02/05/24 0117   rosuvastatin (CRESTOR) tablet 5 mg  5 mg Oral QHS Loletta Parish., MD   5 mg at 02/05/24 2224     Objective: Vital: Vitals:   02/05/24 0800 02/05/24 1200 02/05/24 2055 02/06/24 0428  BP: 117/60 112/62 121/68 115/65  Pulse: 70 72 79 82  Resp: 18 18 20 20   Temp: 97.9 F (36.6 C) 98 F (36.7 C) 97.8 F (36.6 C) 97.8 F (36.6 C)  TempSrc: Oral Oral    SpO2: 98% 98% 98% 96%  Weight:      Height:       I/Os: I/O last 3 completed shifts: In: 3375 [P.O.:1680; I.V.:1595.2; IV Piggyback:99.8] Out: 3000 [Urine:2795; Drains:205]  Physical Exam:  General: Patient is in no apparent distress Lungs: Normal respiratory effort, chest expands symmetrically. GI: Incisions are c/d/i.  Small amount of peri-incisional bruising the abdomen is soft and nontender without mass. JP drain with serosanguinous drainage Ostomy with straw-colored urine and the stent emanating through into the bag. Ext: lower extremities symmetric  Lab Results: Recent Labs    02/03/24 1334 02/04/24 1123 02/04/24 1430 02/05/24 0556 02/06/24 1610  WBC 12.8*  --   --   --   --   HGB 10.3*   < > 9.8* 8.8* 9.2*  HCT 33.3*   < > 30.0* 28.3* 29.6*   < > = values in this interval not displayed.   Recent Labs    02/03/24 1334 02/04/24 1123 02/04/24 1237 02/05/24 0556 02/06/24 0508  NA 139   < > 141 139 137  K 5.0   < > 3.9 4.2 4.4  CL 109  --   --  113* 112*  CO2 17*  --   --  19* 18*  GLUCOSE 245*  --   --  82 89  BUN 79*  --   --  54* 43*  CREATININE 4.50*  --   --  3.23* 3.11*  CALCIUM 8.9  --   --  7.9* 8.1*   < > = values in this interval not displayed.   No results for input(s): "LABPT", "INR" in the last 72  hours. No results for input(s): "LABURIN" in the last 72 hours. Results for orders placed or performed during the hospital encounter of 02/03/24  Surgical pcr screen     Status: None   Collection Time: 02/04/24  8:08 AM   Specimen: Nasal Mucosa; Nasal Swab  Result Value Ref Range Status   MRSA, PCR NEGATIVE NEGATIVE Final   Staphylococcus aureus NEGATIVE NEGATIVE Final    Comment: (NOTE) The Xpert SA Assay (FDA approved for NASAL specimens in patients 60 years of age and older), is one component of a comprehensive surveillance program. It is not intended to diagnose infection nor to guide or monitor treatment. Performed at [facility], [address] [place], [place] [address]     Studies/Results: No results found.  Assessment: Procedure(s): ROBOT ASSISTED LAPAROSCOPIC RADICAL CYSTOPROSTATECTOMY LYMPHADENECTOMY, PELVIS, ROBOT-ASSISTED CYSTOSCOPY WITH ICG RIGHT CUTANEOUS URETEROSCOPY, 2 Days Post-Op  doing well.  Plan: Stable hemoglobin with improving creatinine  Continue with IV Tylenol for the time being.  Use narcotics sparingly Regular diet Subcu heparin and ambulation prophylaxis No antibiotics necessary Continue physical therapy Expect the patient to be here through the weekend.  Wound care/ostomy consult prior to discharge   Berniece Salines, MD Urology 02/06/2024, 12:05 PM

## 2024-02-06 NOTE — Plan of Care (Signed)

## 2024-02-06 NOTE — Consult Note (Signed)
 New ileal conduit, marked preoperatively WOC nurse will see Monday 02/07/24 for post op teaching.   Elder Davidian [facility], CNS, [employer] [phone]

## 2024-02-07 LAB — BASIC METABOLIC PANEL
Anion gap: 8 (ref 5–15)
BUN: 44 mg/dL — ABNORMAL HIGH (ref 8–23)
CO2: 17 mmol/L — ABNORMAL LOW (ref 22–32)
Calcium: 8.4 mg/dL — ABNORMAL LOW (ref 8.9–10.3)
Chloride: 111 mmol/L (ref 98–111)
Creatinine, Ser: 3.51 mg/dL — ABNORMAL HIGH (ref 0.61–1.24)
GFR, Estimated: 16 mL/min — ABNORMAL LOW (ref >60)
Glucose, Bld: 108 mg/dL — ABNORMAL HIGH (ref 70–99)
Potassium: 4.4 mmol/L (ref 3.5–5.1)
Sodium: 136 mmol/L (ref 135–145)

## 2024-02-07 LAB — HEMOGLOBIN AND HEMATOCRIT, BLOOD
HCT: 33 % — ABNORMAL LOW (ref 39.0–52.0)
Hemoglobin: 10 g/dL — ABNORMAL LOW (ref 13.0–17.0)

## 2024-02-07 NOTE — Progress Notes (Signed)
 3 Days Post-Op   Subjective/Chief Complaint:  1 - BCG-Refractory Bladder Cancer - s/p cystoprostatectomy with ICG sentinal + template node dissection and Rt cutaneous ureterostomy 02/04/24. Path pending. Admitted 3/13 for bowel prep / stomal marking.  2 - Disposition / Rehab - some functional decline at baselien but still independent in most ADL's. PT eval 3/15 recs HHPT. Workign with ostomy RN team in house as well.  Today "[name] [name]" is stable. Hgb 10, Cr 3.5 (baseline). Ambulating with assistance. Tolerating regular diet.    Objective: Vital signs in last 24 hours: Temp:  [98.2 F (36.8 C)-99 F (37.2 C)] 99 F (37.2 C) (03/17 0532) Pulse Rate:  [80-83] 82 (03/17 0532) Resp:  [16-18] 18 (03/17 0532) BP: (117-130)/(55-77) 130/77 (03/17 0532) SpO2:  [98 %-99 %] 99 % (03/17 0532) Last BM Date : 02/04/24  Intake/Output from previous day: 03/16 0701 - 03/17 0700 In: 1220 [P.O.:720; IV Piggyback:500] Out: 4696 [EXBMW:4132; Drains:90] Intake/Output this shift: No intake/output data recorded.  NAD, Elderly but mentally spy Non-labored breathing on RA RRR SNTND RLQ cutaneous ureterosctomy patent with Red distal bander stent and copious non-foul urine. SCD's in place, no c/c/e  Lab Results:  Recent Labs    02/06/24 0508 02/07/24 0427  HGB 9.2* 10.0*  HCT 29.6* 33.0*   BMET Recent Labs    02/06/24 0508 02/07/24 0427  NA 137 136  K 4.4 4.4  CL 112* 111  CO2 18* 17*  GLUCOSE 89 108*  BUN 43* 44*  CREATININE 3.11* 3.51*  CALCIUM 8.1* 8.4*   PT/INR No results for input(s): "LABPROT", "INR" in the last 72 hours. ABG Recent Labs    02/04/24 1123 02/04/24 1237  PHART 7.292* 7.210*  HCO3 13.0* 17.9*    Studies/Results: No results found.  Anti-infectives: Anti-infectives (From admission, onward)    Start     Dose/Rate Route Frequency Ordered Stop   02/04/24 0600  piperacillin-tazobactam (ZOSYN) IVPB 3.375 g        3.375 g 100 mL/hr over 30 Minutes Intravenous On  call to O.R. 02/03/24 1314 02/04/24 1106   02/03/24 2230  neomycin (MYCIFRADIN) tablet 500 mg        500 mg Oral Every 4 hours 02/03/24 2051 02/04/24 0210   02/03/24 2230  metroNIDAZOLE (FLAGYL) tablet 500 mg        500 mg Oral Every 4 hours 02/03/24 2051 02/04/24 0210       Assessment/Plan:  Doing very well s/p major extirpative surgery in older man. Goals for DC discussed. I feel reasonable plan tentatively for tomorrow. Will request HHPT and HHRN if possible.    Loletta Parish. 02/07/2024

## 2024-02-07 NOTE — TOC Initial Note (Addendum)
 Transition of Care [facility]) - Initial/Assessment Note   Patient Details  Name: [name] [name] MRN: [id number] Date of Birth: Feb 16, 1932  Transition of Care [facility]) CM/SW Contact:    Ewing Schlein, LCSW Phone Number: 02/07/2024, 3:21 PM  Clinical Narrative: PT evaluation recommended HHPT and WOC RN recommended [facility] for new ureterostomy. CSW met with patient and son, [name] [name], to discuss recommendations. Patient is agreeable to being set up with [facility] and does not have an agency preference. CSW made [facility] referrals to the following agencies:  [facility]: declined due to lack of nursing [facility]: declined due to staffing [facility]: declined [facility]: under review  Addendum: Per Marylene Land with [facility], the agency cannot accept due to lack of nursing availability.  [facility]: declined [facility]: awaiting response [facility]: awaiting response  Expected Discharge Plan: Home w Home Health Services Barriers to Discharge: Continued Medical Work up  Patient Goals and CMS Choice Patient states their goals for this hospitalization and ongoing recovery are:: Go home with [facility] CMS Medicare.gov Compare Post Acute Care list provided to:: Patient Choice offered to / list presented to : Patient  Expected Discharge Plan and Services In-house Referral: Clinical Social Work Post Acute Care Choice: Home Health Living arrangements for the past 2 months: Single Family Home           DME Arranged: N/A DME Agency: NA  Prior Living Arrangements/Services Living arrangements for the past 2 months: Single Family Home Patient language and need for interpreter reviewed:: Yes Do you feel safe going back to the place where you live?: Yes      Need for Family Participation in Patient Care: No (Comment) Care giver support system in place?: Yes (comment) Criminal Activity/Legal Involvement Pertinent to Current Situation/Hospitalization: No - Comment as needed  Activities of Daily Living ADL Screening (condition at time of  admission) Independently performs ADLs?: Yes (appropriate for developmental age) Is the patient deaf or have difficulty hearing?: No Does the patient have difficulty seeing, even when wearing glasses/contacts?: No Does the patient have difficulty concentrating, remembering, or making decisions?: No  Permission Sought/Granted Permission sought to share information with : Other (comment) Permission granted to share information with : Yes, Verbal Permission Granted Permission granted to share info w AGENCY: HH agencies  Emotional Assessment Attitude/Demeanor/Rapport: Engaged Affect (typically observed): Accepting Orientation: : Oriented to Self, Oriented to Place, Oriented to  Time, Oriented to Situation Alcohol / Substance Use: Not Applicable Psych Involvement: No (comment)  Admission diagnosis:  Bladder cancer [facility]) [C67.9] Patient Active Problem List   Diagnosis Date Noted   Bladder cancer (HCC) 02/04/2024   History of iron deficiency 12/20/2023   Dysuria 10/15/2023   AKI (acute kidney injury) (HCC) 10/08/2023   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 10/02/2023   Decreased appetite 09/28/2023   Hardening of the aorta (main artery of the heart) (HCC) 09/28/2023   Mixed hyperlipidemia 09/28/2023   Prediabetes 09/28/2023   Panic attack 09/28/2023   Pedal edema 09/28/2023   Osteoarthritis 09/28/2023   Sinusitis chronic, sphenoidal 09/28/2023   Acute blood loss anemia (ABLA) 09/28/2023   Hypothyroidism 09/28/2023   Low serum vitamin B12 09/10/2023   Anemia of chronic disease 09/10/2023   Normocytic anemia 08/06/2023   Unintentional weight loss 04/09/2021   Leg weakness, bilateral 04/09/2021   Surgical counseling visit 04/09/2021   Essential hypertension 05/23/2020   Sleep apnea treated with nocturnal BiPAP 01/25/2020   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 [place] [address] Phone: [phone] Fax: [phone]  Social Drivers of Health (SDOH) Social History: SDOH Screenings   Food Insecurity: No Food Insecurity (02/03/2024)  Housing: Low Risk  (02/03/2024)  Transportation Needs: No Transportation Needs (02/03/2024)  Utilities: Not At Risk (02/03/2024)  Depression (PHQ2-9): Low Risk  (11/16/2023)  Financial Resource Strain: Low Risk  (10/12/2023)  Physical Activity: Insufficiently Active (10/12/2023)  Social Connections: Moderately Isolated (02/03/2024)  Tobacco Use: Low Risk  (02/04/2024)  Health Literacy: Adequate Health Literacy (11/16/2023)   SDOH Interventions:    Readmission Risk Interventions     No data to display

## 2024-02-07 NOTE — Progress Notes (Signed)
 Physical Therapy Treatment Patient Details Name: [name] [name] MRN: [id number] DOB: Nov 14, 1932 Today's Date: 02/07/2024   History of Present Illness 89 yo male admitted with bladder Ca s/p lap residual cystectomy, prostatectomy, R ureterostomy 02/04/24. Hx of bladder + renal pelvis Ca, CKD, TURBT 07/2017    PT Comments  Pt progressing very well with mobility. Quite pleasant and willing to mobilize.   No LOB with amb x 380 using RW, distance to tolerance; pt still having some R abd discomfort, R hip pain at baseline (d/t arthritis per pt). Will continue to follow in acute setting. May benefit from HHPT at d/c to allow return to PLOF   If plan is discharge home, recommend the following: A little help with walking and/or transfers;A little help with bathing/dressing/bathroom;Assistance with cooking/housework;Assist for transportation;Help with stairs or ramp for entrance   Can travel by private vehicle        Equipment Recommendations  None recommended by PT    Recommendations for Other Services       Precautions / Restrictions Precautions Precautions: Fall Precaution/Restrictions Comments: L jp drain, urostomy R     Mobility  Bed Mobility Overal bed mobility: Needs Assistance Bed Mobility: Supine to Sit, Sit to Supine     Supine to sit: Used rails, Contact guard Sit to supine: Min assist   General bed mobility comments: Increased time and Effort. use of rail, CGA to guide trunk to upright, assist to lift RLE on to bed d/t pain    Transfers Overall transfer level: Needs assistance Equipment used: Rolling walker (2 wheels) Transfers: Sit to/from Stand Sit to Stand: Contact guard assist, From elevated surface           General transfer comment: Cues for safety, hand placement.    Ambulation/Gait Ambulation/Gait assistance: Contact guard assist, Supervision Gait Distance (Feet): 380 Feet Assistive device: Rolling walker (2 wheels) Gait Pattern/deviations:  Step-through pattern, Decreased stride length, Decreased weight shift to right       General Gait Details: no dizziness, no overt LOB, decr speed with incr distance d/t R hip discomfort.  CGA to supervision for safety   Stairs             Wheelchair Mobility     Tilt Bed    Modified Rankin (Stroke Patients Only)       Balance           Standing balance support: Bilateral upper extremity supported, During functional activity, Reliant on assistive device for balance, Single extremity supported Standing balance-Leahy Scale: Poor (at least unilateral UE support needed)                              Communication Communication Communication: No apparent difficulties  Cognition Arousal: Alert Behavior During Therapy: WFL for tasks assessed/performed   PT - Cognitive impairments: No apparent impairments                         Following commands: Intact      Cueing Cueing Techniques: Verbal cues  Exercises      General Comments        Pertinent Vitals/Pain Pain Assessment Pain Assessment: 0-10 Pain Score: 4  Pain Location: abd and R hip (OA at baseline) Pain Descriptors / Indicators: Aching, Tightness, Sore Pain Intervention(s): Limited activity within patient's tolerance, Monitored during session, Premedicated before session, Repositioned    Home Living  Prior Function            PT Goals (current goals can now be found in the care plan section) Acute Rehab PT Goals Patient Stated Goal: regain plof/independence PT Goal Formulation: With patient Time For Goal Achievement: 02/19/24 Potential to Achieve Goals: Good Progress towards PT goals: Progressing toward goals    Frequency    Min 2X/week      PT Plan      Co-evaluation              AM-PAC PT "6 Clicks" Mobility   Outcome Measure  Help needed turning from your back to your side while in a flat bed without using  bedrails?: A Little Help needed moving from lying on your back to sitting on the side of a flat bed without using bedrails?: A Little Help needed moving to and from a bed to a chair (including a wheelchair)?: A Little Help needed standing up from a chair using your arms (e.g., wheelchair or bedside chair)?: A Little Help needed to walk in hospital room?: A Little Help needed climbing 3-5 steps with a railing? : A Little 6 Click Score: 18    End of Session   Activity Tolerance: Patient tolerated treatment well Patient left: in bed;with call bell/phone within reach;with bed alarm set   PT Visit Diagnosis: Difficulty in walking, not elsewhere classified (R26.2);Pain Pain - Right/Left: Right Pain - part of body: Hip (abd)     Time: 0454-0981 PT Time Calculation (min) (ACUTE ONLY): 25 min  Charges:    $Gait Training: 23-37 mins PT General Charges $$ ACUTE PT VISIT: 1 Visit                     Mikyle Sox, PT  Acute Rehab Dept [facility]) [phone]  02/07/2024    [facility] 02/07/2024, 12:48 PM

## 2024-02-07 NOTE — Plan of Care (Signed)

## 2024-02-07 NOTE — Progress Notes (Signed)
 Report received from [place], [place]. Will continue to monitor patient.

## 2024-02-07 NOTE — Consult Note (Signed)
 WOC Nurse ostomy consult note Stoma type/location:  RLQ, ureterostomy, no stoma Stomal assessment/size: NA Peristomal assessment: sutures with red stent, sutured in place  Treatment options for stomal/peristomal skin: NA Output clear yellow urine Ostomy pouching: 1pc. flat Education provided:  Met with patient and his son Demonstrated pouch change Explained role of ostomy nurse Explained creation of ureterostomy  Demonstrated pouch change (cleaning peristomal skin) Education on use wick in stoma to keep skin dry with pouch change once/if stent is removed  Education on emptying when 1/3 to 1/2 full and how to empty Demonstrated hooking pouch to nighttime drainage bag Provided patient with [employer] and marked items currently using Outpatient clinic appointment for 3/21 @ 10 am. Patient and his son aware.  7 pouches, adapters for BSD, extra BSD, educational materials in the patient room Will have HHRN for support at DC as well.   Enrolled patient in [employer] DC program: Yes   Planning for DC tomorrow; WOC nurse will follow up in the am to answer any additional questions.   [name] [name] [facility], CNS, [employer] [phone]

## 2024-02-08 LAB — HEMOGLOBIN AND HEMATOCRIT, BLOOD
HCT: 30.2 % — ABNORMAL LOW (ref 39.0–52.0)
Hemoglobin: 9.2 g/dL — ABNORMAL LOW (ref 13.0–17.0)

## 2024-02-08 LAB — BASIC METABOLIC PANEL
Anion gap: 8 (ref 5–15)
BUN: 38 mg/dL — ABNORMAL HIGH (ref 8–23)
CO2: 18 mmol/L — ABNORMAL LOW (ref 22–32)
Calcium: 8.6 mg/dL — ABNORMAL LOW (ref 8.9–10.3)
Chloride: 112 mmol/L — ABNORMAL HIGH (ref 98–111)
Creatinine, Ser: 3.46 mg/dL — ABNORMAL HIGH (ref 0.61–1.24)
GFR, Estimated: 16 mL/min — ABNORMAL LOW (ref >60)
Glucose, Bld: 95 mg/dL (ref 70–99)
Potassium: 4.1 mmol/L (ref 3.5–5.1)
Sodium: 138 mmol/L (ref 135–145)

## 2024-02-08 MED ORDER — OXYCODONE HCL 5 MG PO TABS: 5.0000 mg | ORAL_TABLET | Freq: Four times a day (QID) | ORAL | 0 refills | Status: DC | PRN

## 2024-02-08 NOTE — TOC Transition Note (Signed)
 Transition of Care [facility]) - Discharge Note  Patient Details  Name: [name] [name] MRN: [id number] Date of Birth: [birth year]  Transition of Care [facility]) CM/SW Contact:  Ewing Schlein, LCSW Phone Number: 02/08/2024, 11:02 AM  Clinical Narrative: [facility] declined the [facility] referral and [facility] is out of network with patient's insurance. CSW made [facility] referral to Cindie with [facility], which was also declined. CSW attempted to make [facility] referral to [facility], but did not receive a response. CSW updated Dr. Berneice Heinrich who is agreeable to CSW making an OPPT referral for patient. OPPT referral made. CSW updated patient regarding HH and OPPT referral. Patient aware he has an appointment this week at the ostomy clinic. TOC signing off.  Final next level of care: OP Rehab Barriers to Discharge: No Home Care Agency will accept this patient  Patient Goals and CMS Choice Patient states their goals for this hospitalization and ongoing recovery are:: Go home with [facility] CMS Medicare.gov Compare Post Acute Care list provided to:: Patient Choice offered to / list presented to : Patient  Discharge Plan and Services Additional resources added to the After Visit Summary for   In-house Referral: Clinical Social Work Post Acute Care Choice: Home Health          DME Arranged: N/A DME Agency: NA  Social Drivers of Health (SDOH) Interventions SDOH Screenings   Food Insecurity: No Food Insecurity (02/03/2024)  Housing: Low Risk  (02/03/2024)  Transportation Needs: No Transportation Needs (02/03/2024)  Utilities: Not At Risk (02/03/2024)  Depression (PHQ2-9): Low Risk  (11/16/2023)  Financial Resource Strain: Low Risk  (10/12/2023)  Physical Activity: Insufficiently Active (10/12/2023)  Social Connections: Moderately Isolated (02/03/2024)  Tobacco Use: Low Risk  (02/04/2024)  Health Literacy: Adequate Health Literacy (11/16/2023)   Readmission Risk Interventions     No data to display

## 2024-02-08 NOTE — Progress Notes (Signed)
   02/08/24 1110  Readmission Prevention Plan - High Risk  Transportation Screening Complete  Home Care Consult (High Risk) Complete  High Risk Social Work Consult for recovery care planning/counseling (includes patient and caregiver) Complete  High Risk Palliative Care Screening Not Applicable  Medication Review Complete

## 2024-02-08 NOTE — Plan of Care (Signed)

## 2024-02-08 NOTE — Progress Notes (Signed)
 Mobility Specialist - Progress Note   02/08/24 1007  Mobility  Activity Ambulated with assistance in hallway  Level of Assistance Modified independent, requires aide device or extra time  Assistive Device Front wheel walker  Distance Ambulated (ft) 480 ft  Activity Response Tolerated well  Mobility Referral Yes  Mobility visit 1 Mobility  Mobility Specialist Start Time (ACUTE ONLY) [id number]  Mobility Specialist Stop Time (ACUTE ONLY) 1007  Mobility Specialist Time Calculation (min) (ACUTE ONLY) 11 min   Pt received in bed and agreeable to mobility. No complaints during session. Pt minA when getting back into bed. Pt to bed after session with all needs met.    [facility]

## 2024-02-08 NOTE — Discharge Summary (Signed)
 Physician Discharge Summary  Patient ID: [name] [name] MRN: [id number] DOB/AGE: [birth year] 89 y.o.  Admit date: 02/03/2024 Discharge date: 02/08/2024  Admission Diagnoses: Bladder Cancer  Discharge Diagnoses:  Principal Problem:   Bladder cancer [facility])   Discharged Condition: good  Hospital Course:   1 - BCG-Refractory Bladder Cancer - s/p cystoprostatectomy with ICG sentinal + template node dissection and Rt cutaneous ureterostomy 02/04/24. Path pending. Admitted 3/13 for bowel prep / stomal marking.   2 - Disposition / Rehab - some functional decline at baselien but still independent in most ADL's. PT eval 3/15 recs [facility] but no agency would accept his insurance. Workign with ostomy RN team in house as well but again no HH agency will accept his insurance for home health nursing. He does have FU at outpatient ostomy clinic for later this week.    By the afternoon of 3/18, the day of discharge, he is ambulatory, maintininng PO nutrition with resumed bowel function, pain controlled on PO meds and felt to be adequate for discharge. Cr 3.5 (at baseline) and Hgb 9.2 at discharge.   Consults:  ostomy RN, case management, PT  Significant Diagnostic Studies: labs: as per above  Treatments: surgery: as per above  Discharge Exam: Blood pressure 117/60, pulse 85, temperature 98.3 F (36.8 C), temperature source Oral, resp. rate 20, height 5\' 8"  (1.727 m), weight 77.1 kg, SpO2 97%.  Expand All Collapse All   NAD, Elderly but mentally spy Non-labored breathing on RA RRR SNTND RLQ cutaneous ureterosctomy patent with Red distal bander stent and copious non-foul urine. SCD's in place, no c/c/e      Disposition: Discharge disposition: 01-Home or Self Care       Discharge Instructions     Ambulatory referral to Physical Therapy   Complete by: As directed       Allergies as of 02/08/2024       Reactions   Macrobid [nitrofurantoin] Shortness Of Breath   Ciprofloxacin  Diarrhea, Other (See Comments)   Occurred with 750 mg dose. Has since taken 500 mg doses and has had no reaction/diarrhea.   Keytruda [pembrolizumab] Other (See Comments)   Weakness in both legs and fatigue   Tramadol Hcl Other (See Comments)   Pt unsure of reaction    Cephalexin Rash, Other (See Comments)   Rash on arms   [name] [mitomycin] Rash, Other (See Comments)   All over body RASH; possibly triggered kidney REMOVAL and RESTLESS LEGS also        Medication List     STOP taking these medications    finasteride 5 MG tablet Commonly known as: PROSCAR   Myrbetriq 50 MG Tb24 tablet Generic drug: mirabegron ER   silodosin 8 MG Caps capsule Commonly known as: RAPAFLO       TAKE these medications    amLODipine 10 MG tablet Commonly known as: NORVASC Take 1 tablet (10 mg total) by mouth daily. What changed: when to take this   ferrous sulfate 325 (65 FE) MG tablet Take 325 mg by mouth daily with supper.   levothyroxine 50 MCG tablet Commonly known as: SYNTHROID Take 50 mcg by mouth daily before breakfast.   oxyCODONE 5 MG immediate release tablet Commonly known as: Roxicodone Take 1 tablet (5 mg total) by mouth every 6 (six) hours as needed for severe pain (pain score 7-10) (post-operatively).   pantoprazole 20 MG tablet Commonly known as: PROTONIX Take 20 mg by mouth at bedtime.   predniSONE 10 MG tablet Commonly known  as: DELTASONE Start at 30 mg today, continue daily for 1 week, followed by 20 mg daily until follow up   psyllium 58.6 % powder Commonly known as: METAMUCIL Take 0.5 packets by mouth every evening.   rosuvastatin 5 MG tablet Commonly known as: CRESTOR Take 5 mg by mouth at bedtime.   senna-docusate 8.6-50 MG tablet Commonly known as: Senokot-S Take 1 tablet by mouth 2 (two) times daily. While taking strong pain meds to prevent constipation What changed:  when to take this reasons to take this   sodium bicarbonate 650 MG  tablet Take 650 mg by mouth See admin instructions. Take 650 mg by mouth at noon and midnight   Tylenol 8 Hour Arthritis Pain 650 MG CR tablet Generic drug: acetaminophen Take 1,300 mg by mouth every 8 (eight) hours as needed for pain.        Follow-up Information     Berneice Heinrich Delbert Phenix., MD Follow up on 02/29/2024.   Specialty: Urology Why: at 10 AM for MD visit Contact information: [address] [place] [address] [phone]                 Signed: Loletta Parish. 02/08/2024, 12:48 PM

## 2024-02-09 ENCOUNTER — Telehealth: Payer: Self-pay

## 2024-02-09 LAB — SURGICAL PATHOLOGY

## 2024-02-09 NOTE — Transitions of Care (Post Inpatient/ED Visit) (Signed)
 02/09/2024  Name: [name] [name] MRN: [id number] DOB: 1932-08-29  Today's TOC FU Call Status: Today's TOC FU Call Status:: Successful TOC FU Call Completed TOC FU Call Complete Date: 02/09/24 Patient's Name and Date of Birth confirmed.  Transition Care Management Follow-up Telephone Call Date of Discharge: 02/08/24 Discharge Facility: [facility]) Type of Discharge: Inpatient Admission How have you been since you were released from the hospital?: Better Any questions or concerns?: No  Items Reviewed: Did you receive and understand the discharge instructions provided?: Yes Medications obtained,verified, and reconciled?: Yes (Medications Reviewed) Any new allergies since your discharge?: No Do you have support at home?: Yes People in Home: child(ren), adult Name of Support/Comfort Primary Source: [name] [name] (Son)  [phone] (Mobile)  Medications Reviewed Today: Medications Reviewed Today     Reviewed by [name] Quarry, RN (Case Manager) on 02/09/24 at 1115  Med List Status: <None>   Medication Order Taking? Sig Documenting Provider Last Dose Status Informant  amLODipine (NORVASC) 10 MG tablet [id number] Yes Take 1 tablet (10 mg total) by mouth daily.  Patient taking differently: Take 10 mg by mouth at bedtime.   [name] Hercules, MD Taking Active Self  ferrous sulfate 325 (65 FE) MG tablet [id number] Yes Take 325 mg by mouth daily with supper. [provider] Taking Active Self  levothyroxine (SYNTHROID, LEVOTHROID) 50 MCG tablet [id number] Yes Take 50 mcg by mouth daily before breakfast. [provider] Taking Active Self           Med Note [name] [name], ANAIS   Sat Sep 11, 2017  8:33 AM)    oxyCODONE (ROXICODONE) 5 MG immediate release tablet [id number] Yes Take 1 tablet (5 mg total) by mouth every 6 (six) hours as needed for severe pain (pain score 7-10) (post-operatively). [name] Parish., MD Taking Active Self  pantoprazole (PROTONIX) 20 MG  tablet [id number] No Take 20 mg by mouth at bedtime.  Patient not taking: Reported on 01/14/2024   [provider] Not Taking Active Self           Med Note [name] [name], [employer]   Fri Jan 14, 2024  9:50 AM) On hold per Dr. [name]  predniSONE (DELTASONE) 10 MG tablet [id number] Yes Start at 30 mg today, continue daily for 1 week, followed by 20 mg daily until follow up [name] Sartorius, MD Taking Active Self           Med Note [name] [name], [name] [name]   Wed Feb 09, 2024 11:13 AM) To follow up with Dr Change on this per patient.   psyllium (METAMUCIL) 58.6 % powder [id number]  Take 0.5 packets by mouth every evening. [provider]  Active Self           Med Note [name] [name], Rondall Radigan B   Wed Feb 09, 2024 11:06 AM) Does not feel it works and has had diarrhea.   rosuvastatin (CRESTOR) 5 MG tablet [id number] Yes Take 5 mg by mouth at bedtime. [provider] Taking Active Self  senna-docusate (SENOKOT-S) 8.6-50 MG tablet [id number] Yes Take 1 tablet by mouth 2 (two) times daily. While taking strong pain meds to prevent constipation Manny, Delbert Phenix., MD Taking Active Self           Med Note [name] [name], [name] [name]   Wed Feb 09, 2024 11:14 AM) As needed but has not been constipated. Cautioned your getting constipated with the pain medications.   sodium bicarbonate 650 MG tablet [id number]  Take 650  mg by mouth See admin instructions. Take 650 mg by mouth at noon and midnight [provider]  Active Self           Med Note [name] [name], Clovis Mankins B   Wed Feb 09, 2024 11:09 AM) Have missed some days but has a rx to pick up and does take, was prescribed by kidney provider.   TYLENOL 8 HOUR ARTHRITIS PAIN 650 MG CR tablet [id number]  Take 1,300 mg by mouth every 8 (eight) hours as needed for pain. [provider]  Active Self           Med Note [name] [name], Sayre Witherington B   Wed Feb 09, 2024 11:11 AM) Is taking this extra strength for pain PRN post this discharge.             Home Care and  Equipment/Supplies: Were Home Health Services Ordered?: NA (Outpatient therapy ordered and scheduled for 02/10/24.) Any new equipment or medical supplies ordered?: No (Has a Rolling Walker, BSC, and showere chair.  at home already, nothing new.)  Functional Questionnaire: Do you need assistance with bathing/showering or dressing?: Yes (Minor assistance: back scrub from son.) Do you need assistance with meal preparation?: Yes Do you need assistance with eating?: No Do you have difficulty maintaining continence:  (Has new removed entire bladder and prostate, new ureterostomy/with stent catheter. Only has one kidney.) Do you need assistance with getting out of bed/getting out of a chair/moving?: Yes Do you have difficulty managing or taking your medications?: No (Has a system, a little confused day of discharge and has written notes and an Dance movement psychotherapist.)  Follow up appointments reviewed: PCP Follow-up appointment confirmed?:  (To call today to set up.) Specialist Hospital Follow-up appointment confirmed?: Yes Date of Specialist follow-up appointment?: 02/29/24 Follow-Up Specialty Provider:: Dr. Sebastian Ache Do you need transportation to your follow-up appointment?: Yes Do you understand care options if your condition(s) worsen?: Yes-patient verbalized understanding Note: Also has follow up call with [facility] Coordinator on 02/15/24 at 1 pm already scheduled and patient is aware. TOC RN CM will inbasket this note to CCM RN CM.   SDOH Interventions Today    Flowsheet Row Most Recent Value  SDOH Interventions   Food Insecurity Interventions Intervention Not Indicated  Housing Interventions Intervention Not Indicated  Transportation Interventions Intervention Not Indicated, Patient Resources (Friends/Family)  Utilities Interventions Intervention Not Indicated  Social Connections Interventions Intervention Not Indicated  Health Literacy Interventions Intervention Not Indicated       TOC Interventions Today    Flowsheet Row Most Recent Value  TOC Interventions   TOC Interventions Discussed/Reviewed TOC Interventions Discussed, Post discharge activity limitations per provider, S/S of infection, Troubleshoot use of DME in the home       Interventions Today    Flowsheet Row Most Recent Value  Chronic Disease   Chronic disease during today's visit Hypertension (HTN), Diabetes, Other, Chronic Kidney Disease/End Stage Renal Disease (ESRD)  General Interventions   General Interventions Discussed/Reviewed Doctor Visits, Durable Medical Equipment (DME), Vaccines  Vaccines COVID-19, Flu, Pneumonia, RSV, Shingles  [Has all vaccines via [facility]. Will ask about T-DAP.]  Doctor Visits Discussed/Reviewed Doctor Visits Discussed, Annual Wellness Visits, PCP, Specialist  Durable Medical Equipment (DME) Bed side commode, Walker, Shower bench  PCP/Specialist Visits Compliance with follow-up visit  Exercise Interventions   Exercise Discussed/Reviewed Physical Activity  Physical Activity Discussed/Reviewed Physical Activity Reviewed  Education Interventions   Education Provided Provided Education  Provided Verbal Education On [employer], When  to see the doctor, Medication, Eye Care  [A1c checked periodically for pre-diabetes per patient.]  Pharmacy Interventions   Pharmacy Dicussed/Reviewed Pharmacy Topics Discussed, Medications and their functions, Medication Adherence  Safety Interventions   Safety Discussed/Reviewed Safety Reviewed, Fall Risk, Home Safety  Advanced Directive Interventions   Advanced Directives Discussed/Reviewed Advanced Directives Reviewed       02/09/24: [facility] RN CM completed post-discharge outreach call with patient. Patient is living with son and daughter in his own home. Son assists with transportation. No SDOH alerts or needs at this time. Understands discharge instructions, medications, adherence, follow up  appointments. Will call PCP today to  make hospital follow up appointment, he had one scheduled but was canceled while in hospital. Outpatient therapy to start 02/10/24 and patient has an appointment, also has appointment scheduled for outpatient ostomy follow up for s/p scheduled ureterostomy this admission for bladder cancer. Patient has one kidney as well and is followed by oncology and renal on outpatient basis. Medication reconciliation completed. Discussed constipation risk associated with opoid pain medication. Patient verbalized understanding of what to contact providers for. Is up to date on all vaccines, except unsure about T-DAP and will ask PCP. Recently has eye exam. Has ACP documents on file in EPIC. Reviewed insurance and how to contact for any new issues--does have a annual RN visit.   Patient declined 30 day program as is being followed by CCM RM CM. To call to schedule PCP hospital follow up after discussing the importance of this. Patient is engaged and active in self care,    Gabriel Cirri MSN, RN RN Case Sales executive Health  [facility] Office Hours Wed/Thur  8:00 am-6:00 pm Direct Dial: [phone] Main Phone [phone]  Fax: [phone] [email or web address]

## 2024-02-10 ENCOUNTER — Ambulatory Visit: Admitting: Physical Therapy

## 2024-02-11 ENCOUNTER — Ambulatory Visit (HOSPITAL_COMMUNITY)
Admit: 2024-02-11 | Discharge: 2024-02-11 | Disposition: A | Discharge status: Home / Self Care | Attending: Internal Medicine | Admitting: Internal Medicine

## 2024-02-11 ENCOUNTER — Other Ambulatory Visit (HOSPITAL_COMMUNITY): Payer: Self-pay | Admitting: Nurse Practitioner

## 2024-02-11 DIAGNOSIS — Z960 Presence of urogenital implants: Secondary | ICD-10-CM | POA: Insufficient documentation

## 2024-02-11 DIAGNOSIS — N1832 Chronic kidney disease, stage 3b: Secondary | ICD-10-CM | POA: Insufficient documentation

## 2024-02-11 DIAGNOSIS — Z936 Other artificial openings of urinary tract status: Secondary | ICD-10-CM

## 2024-02-11 DIAGNOSIS — C679 Malignant neoplasm of bladder, unspecified: Secondary | ICD-10-CM | POA: Diagnosis not present

## 2024-02-11 DIAGNOSIS — L24B3 Irritant contact dermatitis related to fecal or urinary stoma or fistula: Secondary | ICD-10-CM

## 2024-02-11 NOTE — Progress Notes (Signed)
 [facility]   Reason for visit:  RLQ ureterostomy with stent in place HPI:  Bladder cancer with ureterostomy Past Medical History:  Diagnosis Date  . Anemia   . Arthritis    lower back bulging disc, hips, knees, thumbs, shoulder  . Asthma    as a pre teen  . Bladder cancer (HCC) UROLOGIST-  DR MCKENZIE   S/P TURBT 08-16-2017  . Bladder wall hemorrhage 09/28/2023  . BPH (benign prostatic hyperplasia)   . CKD (chronic kidney disease), stage III (HCC)    STAGE 3  B CKD per lov 10-16-2020 dr Thyra Breed Everlene Balls kidney on chart  . Dyspnea   . Fatigue    MIDDLE OF DAY ON OCCASION  . Frequency of urination   . GERD (gastroesophageal reflux disease)    on protonix  . Hematuria   . Hematuria   . History of colon polyps   . HLD (hyperlipidemia)   . HOH (hard of hearing)    slightly hoh right worse thanleft  . HTN (hypertension)   . Hypothyroidism   . Incontinence of urine    weras pads all the time  . 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 Pre-diabetes   . Recurrent bladder papillary carcinoma (HCC) hx 2012 and 2014-- urologist- dr Ronne Binning   s/p  TURBT 08-16-2017  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  . Renal mass 09/02/2022  . 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 10/02/2023  . Sleep apnea    uses cpap set on 20 /4  . Trigger finger of left hand 07/04/2021   last few months per pt  . UTI (urinary tract infection) 08/20/2020  . Weakness of extremity    left knee  . Wears glasses    Family History  Problem Relation Age of Onset  . Hypertension Mother   . Breast cancer Mother   . Other Mother        brain tumor  . Alzheimer's disease Mother   . Emphysema Father        smoker   Allergies  Allergen Reactions  . Macrobid [Nitrofurantoin] Shortness Of Breath  . Ciprofloxacin Diarrhea and Other (See Comments)    Occurred with 750 mg dose. Has since taken 500 mg doses and has had no  reaction/diarrhea.  Rande Lawman [Pembrolizumab] Other (See Comments)    Weakness in both legs and fatigue  . Tramadol Hcl Other (See Comments)    Pt unsure of reaction   . Cephalexin Rash and Other (See Comments)    Rash on arms  . [name] [Mitomycin] Rash and Other (See Comments)    All over body RASH; possibly triggered kidney REMOVAL and RESTLESS LEGS also   Current Outpatient Medications  Medication Sig Dispense Refill Last Dose/Taking  . amLODipine (NORVASC) 10 MG tablet Take 1 tablet (10 mg total) by mouth daily. (Patient taking differently: Take 10 mg by mouth at bedtime.) 90 tablet 1   . ferrous sulfate 325 (65 FE) MG tablet Take 325 mg by mouth daily with supper.     . levothyroxine (SYNTHROID, LEVOTHROID) 50 MCG tablet Take 50 mcg by mouth daily before breakfast.     . oxyCODONE (ROXICODONE) 5 MG immediate release tablet Take 1 tablet (5 mg total) by mouth every 6 (six) hours as needed for severe pain (pain score 7-10) (post-operatively). 15 tablet 0   . pantoprazole (PROTONIX) 20 MG tablet Take 20 mg by mouth at bedtime.     Marland Kitchen  predniSONE (DELTASONE) 10 MG tablet 20 mg daily until follow up 30 tablet 0   . psyllium (METAMUCIL) 58.6 % powder Take 0.5 packets by mouth every evening.     . rosuvastatin (CRESTOR) 5 MG tablet Take 5 mg by mouth at bedtime.     . senna-docusate (SENOKOT-S) 8.6-50 MG tablet Take 1 tablet by mouth 2 (two) times daily. While taking strong pain meds to prevent constipation 10 tablet 0   . sodium bicarbonate 650 MG tablet Take 650 mg by mouth See admin instructions. Take 650 mg by mouth at noon and midnight     . TYLENOL 8 HOUR ARTHRITIS PAIN 650 MG CR tablet Take 1,300 mg by mouth every 8 (eight) hours as needed for pain.      No current facility-administered medications for this encounter.   ROS  Review of Systems  Constitutional:  Positive for fatigue (regaining strength and endurance.).  Gastrointestinal:        RLQ ureterostomy with stent in place.    Genitourinary:        RLQ ureterostomy with stent in place  Skin: Negative.   Psychiatric/Behavioral: Negative.    All other systems reviewed and are negative. Vital signs:  BP 138/70 (BP Location: Right Arm)   Pulse 86   Temp 97.9 F (36.6 C) (Oral)   Resp 20   SpO2 98%  Exam:  Physical Exam Vitals reviewed.  Constitutional:      Appearance: Normal appearance.  Cardiovascular:     Rate and Rhythm: Normal rate and regular rhythm.     Pulses: Normal pulses.     Heart sounds: Normal heart sounds.  Pulmonary:     Breath sounds: Normal breath sounds.  Abdominal:     Palpations: Abdomen is soft.  Genitourinary:    Comments: History bladder cancer Musculoskeletal:        General: Normal range of motion.  Skin:    General: Skin is warm and dry.  Neurological:     General: No focal deficit present.     Mental Status: He is alert and oriented to person, place, and time. Mental status is at baseline.  Psychiatric:        Mood and Affect: Mood normal.        Behavior: Behavior normal.    Stoma type/location:  RLQ opening in skin with stent.  Stomal assessment/size:  opening 7/8"  Peristomal assessment:  intact Treatment options for stomal/peristomal skin: barrier ring and 1 piece convex urostomy pouch Output: clear yellow urine Ostomy pouching: 1pc convex  this will promote urine flow into the pouch, less creeping beneath the barrier.  Added ostomy belt for added security.  Connects to bedside drainage at night   Education provided:  we perform pouch change.  DIscuss Dr Berneice Heinrich will remove stents and we want to implement a system that will promote urine flow into the pouch. Adding ostomy belt     Impression/dx  ureterostomy Discussion  See above, adding convexity.  Plan  Will set up with [employer] for ongoing supplies.     Visit time: 45 minutes.   Mike Gip FNP-BC

## 2024-02-15 ENCOUNTER — Other Ambulatory Visit: Payer: Self-pay

## 2024-02-15 ENCOUNTER — Ambulatory Visit: Payer: Self-pay

## 2024-02-15 DIAGNOSIS — Z936 Other artificial openings of urinary tract status: Secondary | ICD-10-CM | POA: Insufficient documentation

## 2024-02-15 DIAGNOSIS — N179 Acute kidney failure, unspecified: Secondary | ICD-10-CM

## 2024-02-15 MED ORDER — PREDNISONE 10 MG PO TABS: ORAL_TABLET | ORAL | 0 refills | Status: DC

## 2024-02-15 NOTE — Patient Outreach (Signed)
 Care Coordination   Follow Up Visit Note   02/15/2024 Name: [name] [name] MRN: [id number] DOB: 1932/08/11  [name] [name] is a 89 y.o. year old male who sees [name], MD for primary care. I spoke with  [name] Caldwell Hark by phone today.  What matters to the patients health and wellness today?  Get adjusted to Ureterostomy     Goals Addressed             This Visit's Progress    Maintaining Ureterostomy       Care Coordination Interventions: Evaluation of current treatment plan related to Ureterostomy  and patient's adherence to plan as established by provider Advised patient to follow up with providers as scheduled Discussed plans with patient for ongoing care management follow up and provided patient with direct contact information for care management team Emotional Support Provided  Spoke with patient, he reports is doing well since surgery. He had a recent Ureterostomy placed due to bladder cancer.  He reports he has follow up with Dr. Berneice Heinrich and Dr. [name] scheduled.  He is going to the ostomy clinic to assist with transition.  Son is in the home to assist as needed.  He reports abdominal pain at times but has medication to help.  He is working to gain his strength back.  Encouraged continued working with his ostomy to get his technique with bag placement.  He verbalized understanding.          SDOH assessments and interventions completed:  Yes     Care Coordination Interventions:  Yes, provided   Follow up plan: Follow up call scheduled for 03/02/24    Encounter Outcome:  Patient Visit Completed   Bary Leriche RN, MSN [place]  [facility] RN Care Manager Direct Dial: [phone]  Fax: [phone] Website: [email or web address]

## 2024-02-15 NOTE — Discharge Instructions (Signed)
 Switching to 1 piece convex with barrier ring to promote seal.  Adding ostomy belt to increase seal security

## 2024-02-15 NOTE — Patient Instructions (Signed)
 Visit Information  Thank you for taking time to visit with me today. Please don't hesitate to contact me if I can be of assistance to you.   Following are the goals we discussed today:   Goals Addressed             This Visit's Progress    Maintaining Ureterostomy       Care Coordination Interventions: Evaluation of current treatment plan related to Ureterostomy  and patient's adherence to plan as established by provider Advised patient to follow up with providers as scheduled Discussed plans with patient for ongoing care management follow up and provided patient with direct contact information for care management team Emotional Support Provided  Spoke with patient, he reports is doing well since surgery. He had a recent Ureterostomy placed due to bladder cancer.  He reports he has follow up with Dr. Berneice Heinrich and Dr. [name] scheduled.  He is going to the ostomy clinic to assist with transition.  Son is in the home to assist as needed.  He reports abdominal pain at times but has medication to help.  He is working to gain his strength back.  Encouraged continued working with his ostomy to get his technique with bag placement.  He verbalized understanding.          Your next appointment is by telephone on 03/02/24 at 100 pm  Please call the care guide team at [phone] if you need to cancel or reschedule your appointment.   If you are experiencing a Mental Health or Behavioral Health Crisis or need someone to talk to, please call the Suicide and Crisis Lifeline: 988   Patient verbalizes understanding of instructions and care plan provided today and agrees to view in MyChart. Active MyChart status and patient understanding of how to access instructions and care plan via MyChart confirmed with patient.     The patient has been provided with contact information for the care management team and has been advised to call with any health related questions or concerns.   Bary Leriche RN,  MSN [facility], [facility] RN Care Manager Direct Dial: [phone]  Fax: [phone] Website: [email or web address]

## 2024-02-16 DIAGNOSIS — Z905 Acquired absence of kidney: Secondary | ICD-10-CM | POA: Diagnosis not present

## 2024-02-16 DIAGNOSIS — Z09 Encounter for follow-up examination after completed treatment for conditions other than malignant neoplasm: Secondary | ICD-10-CM | POA: Diagnosis not present

## 2024-02-16 DIAGNOSIS — R600 Localized edema: Secondary | ICD-10-CM | POA: Diagnosis not present

## 2024-02-16 DIAGNOSIS — Z936 Other artificial openings of urinary tract status: Secondary | ICD-10-CM | POA: Diagnosis not present

## 2024-02-16 DIAGNOSIS — R7309 Other abnormal glucose: Secondary | ICD-10-CM | POA: Diagnosis not present

## 2024-02-16 DIAGNOSIS — R6889 Other general symptoms and signs: Secondary | ICD-10-CM | POA: Diagnosis not present

## 2024-02-17 ENCOUNTER — Ambulatory Visit: Admitting: Physical Therapy

## 2024-02-21 DIAGNOSIS — R8271 Bacteriuria: Secondary | ICD-10-CM | POA: Diagnosis not present

## 2024-02-24 ENCOUNTER — Ambulatory Visit: Admitting: Physical Therapy

## 2024-02-24 ENCOUNTER — Encounter: Payer: Self-pay | Admitting: Physical Therapy

## 2024-02-24 DIAGNOSIS — R2689 Other abnormalities of gait and mobility: Secondary | ICD-10-CM

## 2024-02-24 DIAGNOSIS — M25551 Pain in right hip: Secondary | ICD-10-CM

## 2024-02-24 DIAGNOSIS — M6281 Muscle weakness (generalized): Secondary | ICD-10-CM | POA: Diagnosis not present

## 2024-02-24 NOTE — Therapy (Signed)
 OUTPATIENT PHYSICAL THERAPY LOWER EXTREMITY EVALUATION   Patient Name: [name] [name] MRN: [id number] DOB:1932-01-06, 89 y.o., male Today's Date: 02/24/2024  END OF SESSION:  PT End of Session - 02/24/24 1518     Visit Number 1    Number of Visits 16    Authorization Type [employer] medicare    PT Start Time 1345    PT Stop Time 1425    PT Time Calculation (min) 40 min    Activity Tolerance Patient tolerated treatment well    Behavior During Therapy WFL for tasks assessed/performed             Past Medical History:  Diagnosis Date   Anemia    Arthritis    lower back bulging disc, hips, knees, thumbs, shoulder   Asthma    as a pre teen   Bladder cancer (HCC) UROLOGIST-  DR MCKENZIE   S/P TURBT 08-16-2017   Bladder wall hemorrhage 09/28/2023   BPH (benign prostatic hyperplasia)    CKD (chronic kidney disease), stage III (HCC)    STAGE 3  B CKD per lov 10-16-2020 dr Thyra Breed Everlene Balls kidney on chart   Dyspnea    Fatigue    MIDDLE OF DAY ON OCCASION   Frequency of urination    GERD (gastroesophageal reflux disease)    on protonix   Hematuria    Hematuria    History of colon polyps    HLD (hyperlipidemia)    HOH (hard of hearing)    slightly hoh right worse thanleft   HTN (hypertension)    Hypothyroidism    Incontinence of urine    weras pads all the time   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   Right Buccal Cheek   Pre-diabetes    Recurrent bladder papillary carcinoma (HCC) hx 2012 and 2014-- urologist- dr Ronne Binning   s/p  TURBT 08-16-2017  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   Renal mass 09/02/2022   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 10/02/2023   Sleep apnea    uses cpap set on 20 /4   Trigger finger of left hand 07/04/2021   last few months per pt   UTI (urinary tract infection) 08/20/2020   Weakness of extremity    left knee   Wears glasses    Past Surgical History:  Procedure Laterality Date    BCG X 2     AFTER LAST 2 BLADDER TUMOR REMOVALS   CATARACT EXTRACTION, BILATERAL     COLONSCOPY  YRS AGO   CYSTOSCOPY N/A 10/29/2020   Procedure: CYSTOSCOPY;  Surgeon: Noel Christmas, MD;  Location: [facility];  Service: Urology;  Laterality: N/A;   CYSTOSCOPY N/A 07/08/2021   Procedure: CYSTOSCOPY;  Surgeon: Noel Christmas, MD;  Location: [facility];  Service: Urology;  Laterality: N/A;   CYSTOSCOPY W/ RETROGRADES Right 07/21/2023   Procedure: CYSTOSCOPY WITH RIGHT RETROGRADE PYELOGRAM;  Surgeon: Loletta Parish., MD;  Location: [facility];  Service: Urology;  Laterality: Right;   CYSTOSCOPY W/ URETERAL STENT PLACEMENT Bilateral 08/16/2017   Procedure: CYSTOSCOPY WITH RETROGRADE PYELOGRAM/URETERAL STENT PLACEMENT;  Surgeon: Malen Gauze, MD;  Location: [facility];  Service: Urology;  Laterality: Bilateral;   CYSTOSCOPY W/ URETERAL STENT PLACEMENT Left 09/02/2022   Procedure: CYSTOSCOPY WITH RETROGRADE PYELOGRAM, URETERAL STENT PLACEMENT;  Surgeon: Sebastian Ache, MD;  Location: [facility];  Service: Urology;  Laterality: Left;   CYSTOSCOPY WITH BIOPSY N/A 06/25/2020   Procedure:  CYSTOSCOPY FULGURATION   BLADDER  BIOPSY;  Surgeon: Noel Christmas, MD;  Location: [facility];  Service: Urology;  Laterality: N/A;   CYSTOSCOPY WITH FULGERATION N/A 12/08/2019   Procedure: CYSTOSCOPY WITH FULGERATION;  Surgeon: Malen Gauze, MD;  Location: [facility];  Service: Urology;  Laterality: N/A;   CYSTOSCOPY WITH FULGERATION N/A 03/24/2022   Procedure: CYSTOSCOPY WITH FULGERATION;  Surgeon: Noel Christmas, MD;  Location: [facility];  Service: Urology;  Laterality: N/A;   CYSTOSCOPY WITH RETROGRADE PYELOGRAM, URETEROSCOPY AND STENT PLACEMENT Left 03/24/2022   Procedure: CYSTOSCOPY WITH RETROGRADE PYELOGRAM, DIAGNOSTIC URETEROSCOPY WITH BIOPSY AND STENT PLACEMENT;  Surgeon: Noel Christmas, MD;  Location: [facility];   Service: Urology;  Laterality: Left;  90 MINUTES   CYSTOSCOPY WITH RETROGRADE PYELOGRAM, URETEROSCOPY AND STENT PLACEMENT Left 04/07/2022   Procedure: CYSTOSCOPY WITH  RETROGRADE PYELOGRAM, DIAGNOSTIC URETEROSCOPY WITH BIOPSY AND STENT EXCHANGE;  Surgeon: Noel Christmas, MD;  Location: [facility];  Service: Urology;  Laterality: Left;  75 MINUTES   CYSTOSCOPY WITH RETROGRADE PYELOGRAM, URETEROSCOPY AND STENT PLACEMENT Left 08/11/2022   Procedure: CYSTOSCOPY WITH RETROGRADE PYELOGRAM, ANTEGRADE NEPHROSTOGRAM, DIAGNOSTIC  URETEROSCOPY;  Surgeon: Noel Christmas, MD;  Location: [facility];  Service: Urology;  Laterality: Left;  90 MINS   HOLMIUM LASER APPLICATION Left 08/11/2022   Procedure: HOLMIUM LASER APPLICATION;  Surgeon: Noel Christmas, MD;  Location: [facility];  Service: Urology;  Laterality: Left;   IR NEPHROSTOMY EXCHANGE LEFT  05/15/2022   IR NEPHROSTOMY EXCHANGE LEFT  07/10/2022   IR NEPHROSTOMY PLACEMENT LEFT  05/06/2022   KNEE ARTHROSCOPY Left 06/10/2011   LYMPH NODE DISSECTION Left 09/02/2022   Procedure: RETROPERITONEAL LYMPH NODE DISSECTION;  Surgeon: Sebastian Ache, MD;  Location: [facility];  Service: Urology;  Laterality: Left;   PROSTATE BIOPSY  2000   ROBOT ASSITED LAPAROSCOPIC NEPHROURETERECTOMY Left 09/02/2022   Procedure: XI ROBOT ASSITED LAPAROSCOPIC NEPHROURETERECTOMY;  Surgeon: Sebastian Ache, MD;  Location: [facility];  Service: Urology;  Laterality: Left;   ROBOT LAP RADICAL CYSTOPROSTATECTOMY PELVIC LYMPHADENECTOMY, NEOBLADDER N/A 02/04/2024   Procedure: ROBOT ASSISTED LAPAROSCOPIC RADICAL CYSTOPROSTATECTOMY;  Surgeon: Loletta Parish., MD;  Location: [facility];  Service: Urology;  Laterality: N/A;  300 MINUTES NEEDED FOR CASE   TRANSURETHRAL RESECTION OF BLADDER TUMOR N/A 08/16/2017   Procedure: TRANSURETHRAL RESECTION OF BLADDER TUMOR (TURBT);  Surgeon: Malen Gauze, MD;  Location: [facility];  Service: Urology;  Laterality: N/A;   TRANSURETHRAL RESECTION OF  BLADDER TUMOR  12-09-2012   dr Marcelyn Bruins [facility]   and TURP   TRANSURETHRAL RESECTION OF BLADDER TUMOR N/A 09/20/2017   Procedure: TRANSURETHRAL RESECTION OF BLADDER TUMOR (TURBT);  Surgeon: Malen Gauze, MD;  Location: [facility];  Service: Urology;  Laterality: N/A;   TRANSURETHRAL RESECTION OF BLADDER TUMOR N/A 12/08/2019   Procedure: TRANSURETHRAL RESECTION OF BLADDER TUMOR;  Surgeon: Malen Gauze, MD;  Location: [facility];  Service: Urology;  Laterality: N/A;  1 HR   TRANSURETHRAL RESECTION OF BLADDER TUMOR N/A 10/29/2020   Procedure: BLADDER FULGERATION;  Surgeon: Noel Christmas, MD;  Location: [facility];  Service: Urology;  Laterality: N/A;  30 MINS   TRANSURETHRAL RESECTION OF BLADDER TUMOR N/A 07/08/2021   Procedure: TRANSURETHRAL RESECTION OF BLADDER TUMOR (TURBT)/FULGERATION;  Surgeon: Noel Christmas, MD;  Location: [facility];  Service: Urology;  Laterality: N/A;  45 MINS   TRANSURETHRAL RESECTION OF BLADDER TUMOR N/A 12/02/2021  Procedure: TRANSURETHRAL RESECTION OF BLADDER TUMOR (TURBT);  Surgeon: Noel Christmas, MD;  Location: [facility];  Service: Urology;  Laterality: N/A;   TRANSURETHRAL RESECTION OF BLADDER TUMOR N/A 07/21/2023   Procedure: TRANSURETHRAL RESECTION OF BLADDER TUMOR (TURBT);  Surgeon: Loletta Parish., MD;  Location: [facility];  Service: Urology;  Laterality: N/A;  60 MINUTES   TRANSURETHRAL RESECTION OF BLADDER TUMOR N/A 10/01/2023   Procedure: TRANSURETHRAL RESECTION OF BLADDER TUMOR (TURBT)/CLOT EVACUATION WITH FULGERATION;  Surgeon: Loletta Parish., MD;  Location: [facility];  Service: Urology;  Laterality: N/A;   TRANSURETHRAL RESECTION OF PROSTATE  2011   UPPER GI ENDOSCOPY  YRS AGO   URETEROSCOPY Right 02/04/2024   Procedure: RIGHT CUTANEOUS URETEROSCOPY;  Surgeon: Loletta Parish., MD;  Location: [facility];  Service: Urology;  Laterality: Right;   Patient Active  Problem List   Diagnosis Date Noted   Ureterostomy status (HCC) 02/15/2024   Bladder cancer (HCC) 02/04/2024   History of iron deficiency 12/20/2023   Dysuria 10/15/2023   AKI (acute kidney injury) (HCC) 10/08/2023   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 10/02/2023   Decreased appetite 09/28/2023   Hardening of the aorta (main artery of the heart) (HCC) 09/28/2023   Mixed hyperlipidemia 09/28/2023   Prediabetes 09/28/2023   Panic attack 09/28/2023   Pedal edema 09/28/2023   Osteoarthritis 09/28/2023   Sinusitis chronic, sphenoidal 09/28/2023   Acute blood loss anemia (ABLA) 09/28/2023   Hypothyroidism 09/28/2023   Low serum vitamin B12 09/10/2023   Anemia of chronic disease 09/10/2023   Normocytic anemia 08/06/2023   Unintentional weight loss 04/09/2021   Leg weakness, bilateral 04/09/2021   Surgical counseling visit 04/09/2021   Essential hypertension 05/23/2020   Sleep apnea treated with nocturnal BiPAP 01/25/2020   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   Hypersomnia with sleep apnea 09/15/2019   Cardiac arrhythmia due to premature depolarization 09/15/2019   Excessive daytime sleepiness 09/15/2019   CKD (chronic kidney disease) stage 4, GFR 15-29 ml/min (HCC) - Baseline Scr 3.0 09/15/2019   Malignant neoplasm of urinary bladder (HCC) 09/15/2019   Gross hematuria 09/11/2017   Benign prostatic hyperplasia without lower urinary tract symptoms 12/09/2012   GERD (gastroesophageal reflux disease) 11/28/2012     PCP: [name]  REFERRING PROVIDER: [name]  REFERRING DIAG: Leg weakness   THERAPY DIAG:  Pain in right hip  Muscle weakness (generalized)  Other abnormalities of gait and mobility  Rationale for Evaluation and Treatment: Rehabilitation  ONSET DATE:    SUBJECTIVE:   SUBJECTIVE STATEMENT: Pt states weakness in legs R>L ongoing for a while. He had significant weakness previously from medication issues but has slightly  improved. He states pain in his R hip, sharp pains with twisting at times, and instability feeling with walking and activity due to hip pain.He has had previous L knee surgery, states R hip OA, and has had recent surgery for cancer, with bladder and prostate removal. Since then he states feeling pretty good, he does have swelling in feet and legs, just saw Dr this week who was not concerned. He is wearing sandals today, with obvious swelling in feet and legs.  He used to walk about 2 mi/day in past years, recently he did walk about 1.5 miles States minimal fatigue with endurance, but more limitation from R hip and leg weakness.  He is currently living with son and his wife in split level house. States doing ok with stairs for now. Has walking stick,  but not using today, does usually use most of the time.    PERTINENT HISTORY: Recent bladder and prostate removal for CA, CKD,  Nephrectomy (L),    PAIN:  Are you having pain? Yes: NPRS scale: 5/10    Pain location: R hip/anterior/lateral Pain description: sore, stiff, shooting with twisting motion Aggravating factors: increased activity , initial standing  Relieving factors: none stated   PRECAUTIONS: None  WEIGHT BEARING RESTRICTIONS: No  FALLS:  Has patient fallen in last 6 months? No  PLOF: Independent  PATIENT GOALS:  Decreased pain in hip, improve walking and balance.   NEXT MD VISIT:   OBJECTIVE:   DIAGNOSTIC FINDINGS:   PATIENT SURVEYS:    COGNITION: Overall cognitive status: Within functional limits for tasks assessed     SENSATION: WFL  EDEMA: mod/significant edema in bil lower legs and feet.    POSTURE:    No Significant postural limitations  PALPATION:   mild tenderness at R anterior hip/along groin on R, mild tenderness in lateral hip/gr troch.  Hypomobile R hip   LOWER EXTREMITY ROM: Hips:  L: mild limitation for flex, ER, mod for IR,   R:  mod limitation for all motions.   LOWER EXTREMITY MMT:  MMT  Left eval Right  eval  Hip flexion 4- 3-  Hip extension    Hip abduction    Hip adduction    Hip internal rotation    Hip external rotation    Knee flexion 4 4  Knee extension 4+ 4+  Ankle dorsiflexion    Ankle plantarflexion    Ankle inversion    Ankle eversion     (Blank rows = not tested)  LOWER EXTREMITY SPECIAL TESTS:    FUNCTIONAL TESTS:    GAIT: Distance walked: 177ft Assistive device utilized: None Level of assistance: Complete Independence Comments: antalgic gait for R hip, decreased hip extension on R. , decreased stability   TODAY'S TREATMENT:                                                                                                                              DATE:  02/24/2024  Therapeutic Exercise: Aerobic: Supine: hip ER ROM x 10 3 sec holds;  Seated: LAQ x 10 bil ;   Ankle pumps 2 x 10 bil;   sit to stand x 5 at higher mat table, with education on body mechanics .  Standing: Stretches:   Neuromuscular Re-education: Manual Therapy: Therapeutic Activity: Self Care:    PATIENT EDUCATION:  Education details: PT POC, Exam findings, HEP Person educated: Patient Education method: Explanation, Demonstration, Tactile cues, Verbal cues, and Handouts Education comprehension: verbalized understanding, returned demonstration, verbal cues required, tactile cues required, and needs further education   HOME EXERCISE PROGRAM: Access Code: [email or web address] URL: [email or web address] Date: 02/24/2024 Prepared by: Sedalia Muta  Exercises - Seated Knee Extension AROM  - 1 x daily - 1-2 sets - 5-10 reps - Seated Ankle Pumps on Table  -  3 x daily - 1 sets - 10 reps - Bent Knee Fallouts  - 1 x daily - 1 sets - 10 reps - 3 hold  ASSESSMENT:  CLINICAL IMPRESSION: Patient presents with primary complaint of  pain in R hip, and weakness in R hip/LE. He has stiffness in bil hips, R>L, with lack of ROM. He has decreased strength in R hip vs L, with  feelings of instability with activity. He has decreased gait mechanics, and will benefit from education on use of AD. He has decreased ability and stability with functional activity, gait, and stairs. He is overall deconditioned and will benefit from education on HEP . Pt with decreased ability for full functional activities. Pt will  benefit from skilled PT to improve deficits and pain and to return to PLOF.   OBJECTIVE IMPAIRMENTS: Abnormal gait, decreased activity tolerance, decreased balance, decreased knowledge of use of DME, decreased mobility, difficulty walking, decreased ROM, decreased strength, decreased safety awareness, impaired flexibility, improper body mechanics, and pain.   ACTIVITY LIMITATIONS: bending, standing, squatting, stairs, transfers, and locomotion level  PARTICIPATION LIMITATIONS: meal prep, cleaning, shopping, and community activity  PERSONAL FACTORS: Age, Past/current experiences, Time since onset of injury/illness/exacerbation, and 1-2 comorbidities: CA, recent surgery, OA  are also affecting patient's functional outcome.   REHAB POTENTIAL: Good  CLINICAL DECISION MAKING: Evolving/moderate complexity  EVALUATION COMPLEXITY: Moderate   GOALS: Goals reviewed with patient? Yes  SHORT TERM GOALS: Target date: 03/16/2024  Pt to be independent with initial HEP  Goal status: INITIAL  2.  Pt to demo independent transfer from regular chair, without use of UEs.   Goal status: INITIAL    LONG TERM GOALS: Target date: 04/20/2024  Pt to be independent with final HEP  Goal status: INITIAL  2.  Pt to demo improved strength of R hip to at least 4/5 for flex and abd, to improve ability and stability for gait.   Goal status: INITIAL  3.  Pt to report decreased pain to 0-2/10 in R hip with activity.   Goal status: INITIAL  4.  Pt to demo optimal mechanics and safety  for gait and stairs, with LRAD, to improve ability and safety with community activity.   Goal  status: INITIAL    PLAN:  PT FREQUENCY: 1-2x/week  PT DURATION: 8 weeks  PLANNED INTERVENTIONS: Therapeutic exercises, Therapeutic activity, Neuromuscular re-education, Patient/Family education, Self Care, Joint mobilization, Joint manipulation, Stair training, Orthotic/Fit training, DME instructions, Aquatic Therapy, Dry Needling, Electrical stimulation, Cryotherapy, Moist heat, Taping, Ultrasound, Ionotophoresis 4mg /ml Dexamethasone, Manual therapy,  Vasopneumatic device, Traction, Spinal manipulation, Spinal mobilization,Balance training, Gait training,   PLAN FOR NEXT SESSION:    Sedalia Muta, PT, DPT 3:22 PM  02/24/24

## 2024-02-28 DIAGNOSIS — Z936 Other artificial openings of urinary tract status: Secondary | ICD-10-CM | POA: Diagnosis not present

## 2024-03-01 ENCOUNTER — Other Ambulatory Visit: Payer: Self-pay

## 2024-03-01 ENCOUNTER — Inpatient Hospital Stay (HOSPITAL_COMMUNITY)
Admission: EM | Admit: 2024-03-01 | Discharge: 2024-03-03 | DRG: 699 | Disposition: A | Discharge status: Home / Self Care | Attending: Family Medicine | Admitting: Family Medicine

## 2024-03-01 ENCOUNTER — Emergency Department (HOSPITAL_COMMUNITY)

## 2024-03-01 ENCOUNTER — Encounter (HOSPITAL_COMMUNITY): Payer: Self-pay

## 2024-03-01 DIAGNOSIS — Z7952 Long term (current) use of systemic steroids: Secondary | ICD-10-CM | POA: Diagnosis not present

## 2024-03-01 DIAGNOSIS — E039 Hypothyroidism, unspecified: Secondary | ICD-10-CM | POA: Diagnosis not present

## 2024-03-01 DIAGNOSIS — D631 Anemia in chronic kidney disease: Secondary | ICD-10-CM | POA: Diagnosis present

## 2024-03-01 DIAGNOSIS — R531 Weakness: Secondary | ICD-10-CM | POA: Diagnosis not present

## 2024-03-01 DIAGNOSIS — Z85828 Personal history of other malignant neoplasm of skin: Secondary | ICD-10-CM

## 2024-03-01 DIAGNOSIS — Z8249 Family history of ischemic heart disease and other diseases of the circulatory system: Secondary | ICD-10-CM

## 2024-03-01 DIAGNOSIS — E860 Dehydration: Secondary | ICD-10-CM | POA: Diagnosis not present

## 2024-03-01 DIAGNOSIS — N136 Pyonephrosis: Secondary | ICD-10-CM | POA: Diagnosis present

## 2024-03-01 DIAGNOSIS — M47816 Spondylosis without myelopathy or radiculopathy, lumbar region: Secondary | ICD-10-CM | POA: Diagnosis present

## 2024-03-01 DIAGNOSIS — K219 Gastro-esophageal reflux disease without esophagitis: Secondary | ICD-10-CM | POA: Diagnosis present

## 2024-03-01 DIAGNOSIS — M160 Bilateral primary osteoarthritis of hip: Secondary | ICD-10-CM | POA: Diagnosis not present

## 2024-03-01 DIAGNOSIS — Z936 Other artificial openings of urinary tract status: Secondary | ICD-10-CM

## 2024-03-01 DIAGNOSIS — E785 Hyperlipidemia, unspecified: Secondary | ICD-10-CM | POA: Diagnosis not present

## 2024-03-01 DIAGNOSIS — R109 Unspecified abdominal pain: Principal | ICD-10-CM

## 2024-03-01 DIAGNOSIS — Z825 Family history of asthma and other chronic lower respiratory diseases: Secondary | ICD-10-CM

## 2024-03-01 DIAGNOSIS — T83598A Infection and inflammatory reaction due to other prosthetic device, implant and graft in urinary system, initial encounter: Secondary | ICD-10-CM | POA: Diagnosis not present

## 2024-03-01 DIAGNOSIS — M19042 Primary osteoarthritis, left hand: Secondary | ICD-10-CM | POA: Diagnosis present

## 2024-03-01 DIAGNOSIS — I129 Hypertensive chronic kidney disease with stage 1 through stage 4 chronic kidney disease, or unspecified chronic kidney disease: Secondary | ICD-10-CM | POA: Diagnosis not present

## 2024-03-01 DIAGNOSIS — B9689 Other specified bacterial agents as the cause of diseases classified elsewhere: Secondary | ICD-10-CM | POA: Diagnosis present

## 2024-03-01 DIAGNOSIS — M170 Bilateral primary osteoarthritis of knee: Secondary | ICD-10-CM | POA: Diagnosis present

## 2024-03-01 DIAGNOSIS — Z803 Family history of malignant neoplasm of breast: Secondary | ICD-10-CM

## 2024-03-01 DIAGNOSIS — M19041 Primary osteoarthritis, right hand: Secondary | ICD-10-CM | POA: Diagnosis present

## 2024-03-01 DIAGNOSIS — Z888 Allergy status to other drugs, medicaments and biological substances status: Secondary | ICD-10-CM | POA: Diagnosis not present

## 2024-03-01 DIAGNOSIS — N400 Enlarged prostate without lower urinary tract symptoms: Secondary | ICD-10-CM | POA: Diagnosis present

## 2024-03-01 DIAGNOSIS — K5730 Diverticulosis of large intestine without perforation or abscess without bleeding: Secondary | ICD-10-CM | POA: Diagnosis not present

## 2024-03-01 DIAGNOSIS — Z820 Family history of epilepsy and other diseases of the nervous system: Secondary | ICD-10-CM

## 2024-03-01 DIAGNOSIS — N134 Hydroureter: Secondary | ICD-10-CM | POA: Diagnosis not present

## 2024-03-01 DIAGNOSIS — Y838 Other surgical procedures as the cause of abnormal reaction of the patient, or of later complication, without mention of misadventure at the time of the procedure: Secondary | ICD-10-CM | POA: Diagnosis present

## 2024-03-01 DIAGNOSIS — Z905 Acquired absence of kidney: Secondary | ICD-10-CM

## 2024-03-01 DIAGNOSIS — Z79899 Other long term (current) drug therapy: Secondary | ICD-10-CM

## 2024-03-01 DIAGNOSIS — B9562 Methicillin resistant Staphylococcus aureus infection as the cause of diseases classified elsewhere: Secondary | ICD-10-CM | POA: Diagnosis present

## 2024-03-01 DIAGNOSIS — Z79890 Hormone replacement therapy (postmenopausal): Secondary | ICD-10-CM

## 2024-03-01 DIAGNOSIS — N179 Acute kidney failure, unspecified: Secondary | ICD-10-CM | POA: Diagnosis not present

## 2024-03-01 DIAGNOSIS — N1330 Unspecified hydronephrosis: Secondary | ICD-10-CM | POA: Diagnosis not present

## 2024-03-01 DIAGNOSIS — C679 Malignant neoplasm of bladder, unspecified: Secondary | ICD-10-CM | POA: Diagnosis present

## 2024-03-01 DIAGNOSIS — I700 Atherosclerosis of aorta: Secondary | ICD-10-CM | POA: Diagnosis not present

## 2024-03-01 DIAGNOSIS — Z881 Allergy status to other antibiotic agents status: Secondary | ICD-10-CM

## 2024-03-01 DIAGNOSIS — N390 Urinary tract infection, site not specified: Secondary | ICD-10-CM | POA: Diagnosis not present

## 2024-03-01 DIAGNOSIS — R1084 Generalized abdominal pain: Secondary | ICD-10-CM | POA: Diagnosis not present

## 2024-03-01 DIAGNOSIS — Z860100 Personal history of colon polyps, unspecified: Secondary | ICD-10-CM

## 2024-03-01 DIAGNOSIS — Z1152 Encounter for screening for COVID-19: Secondary | ICD-10-CM | POA: Diagnosis not present

## 2024-03-01 DIAGNOSIS — N184 Chronic kidney disease, stage 4 (severe): Secondary | ICD-10-CM | POA: Diagnosis present

## 2024-03-01 DIAGNOSIS — J9811 Atelectasis: Secondary | ICD-10-CM | POA: Diagnosis not present

## 2024-03-01 DIAGNOSIS — K439 Ventral hernia without obstruction or gangrene: Secondary | ICD-10-CM | POA: Diagnosis present

## 2024-03-01 DIAGNOSIS — Z8551 Personal history of malignant neoplasm of bladder: Secondary | ICD-10-CM

## 2024-03-01 DIAGNOSIS — I10 Essential (primary) hypertension: Secondary | ICD-10-CM | POA: Diagnosis present

## 2024-03-01 DIAGNOSIS — Z8553 Personal history of malignant neoplasm of renal pelvis: Secondary | ICD-10-CM

## 2024-03-01 DIAGNOSIS — D638 Anemia in other chronic diseases classified elsewhere: Secondary | ICD-10-CM | POA: Diagnosis present

## 2024-03-01 LAB — URINALYSIS, ROUTINE W REFLEX MICROSCOPIC
Bilirubin Urine: NEGATIVE
Glucose, UA: NEGATIVE mg/dL
Ketones, ur: NEGATIVE mg/dL
Nitrite: NEGATIVE
Protein, ur: 100 mg/dL — AB
Specific Gravity, Urine: 1.009 (ref 1.005–1.030)
WBC, UA: >50 WBC/hpf (ref 0–5)
pH: 6 (ref 5.0–8.0)

## 2024-03-01 LAB — CBG MONITORING, ED: Glucose-Capillary: 91 mg/dL (ref 70–99)

## 2024-03-01 LAB — CBC
HCT: 36.5 % — ABNORMAL LOW (ref 39.0–52.0)
Hemoglobin: 11.4 g/dL — ABNORMAL LOW (ref 13.0–17.0)
MCH: 30.6 pg (ref 26.0–34.0)
MCHC: 31.2 g/dL (ref 30.0–36.0)
MCV: 98.1 fL (ref 80.0–100.0)
Platelets: 241 10*3/uL (ref 150–400)
RBC: 3.72 MIL/uL — ABNORMAL LOW (ref 4.22–5.81)
RDW: 17 % — ABNORMAL HIGH (ref 11.5–15.5)
WBC: 12.7 10*3/uL — ABNORMAL HIGH (ref 4.0–10.5)
nRBC: 0 % (ref 0.0–0.2)

## 2024-03-01 LAB — BASIC METABOLIC PANEL WITH GFR
Anion gap: 13 (ref 5–15)
BUN: 45 mg/dL — ABNORMAL HIGH (ref 8–23)
CO2: 20 mmol/L — ABNORMAL LOW (ref 22–32)
Calcium: 9.1 mg/dL (ref 8.9–10.3)
Chloride: 107 mmol/L (ref 98–111)
Creatinine, Ser: 3.51 mg/dL — ABNORMAL HIGH (ref 0.61–1.24)
GFR, Estimated: 16 mL/min — ABNORMAL LOW (ref >60)
Glucose, Bld: 106 mg/dL — ABNORMAL HIGH (ref 70–99)
Potassium: 4.2 mmol/L (ref 3.5–5.1)
Sodium: 140 mmol/L (ref 135–145)

## 2024-03-01 LAB — RESP PANEL BY RT-PCR (RSV, FLU A&B, COVID)  RVPGX2
Influenza A by PCR: NEGATIVE
Influenza B by PCR: NEGATIVE
Resp Syncytial Virus by PCR: NEGATIVE
SARS Coronavirus 2 by RT PCR: NEGATIVE

## 2024-03-01 MED ORDER — SODIUM CHLORIDE 0.9 % IV BOLUS
1000.0000 mL | Freq: Once | INTRAVENOUS | Status: AC
  Administered 2024-03-01: 1000 mL via INTRAVENOUS

## 2024-03-01 NOTE — ED Triage Notes (Signed)
 Pt had bladder and prostate removed last week, now has nephrostomy bag, pt only has 1 kidney. Pain in right kidney after stent was removed on Monday. Increased fatigue and weakness that has been progressing throughout the day.

## 2024-03-01 NOTE — ED Notes (Signed)
 Patient transported to CT

## 2024-03-01 NOTE — ED Provider Notes (Signed)
  EMERGENCY DEPARTMENT AT [facility] Provider Note   CSN: [id number] Arrival date & time: 03/01/24  2048     History {Add pertinent medical, surgical, social history, OB history to HPI:1} No chief complaint on file.   [name] [name] is a 89 y.o. male.  The history is provided by the patient and medical records.   89 year old male with history of hypertension, anemia, GERD, arthritis, CKD, hx of bladder cancer s/p cystectomy and prostatectomy 02/08/24 with Dr. Berneice Heinrich.  He did have ureterostomy placed, has been having normal urine output.  R ureteral stent removed yesterday 02/29/24, felt fine yesterday but today started having increased pain in his right flank.  He reports some subjective fever/chills.  Some nausea today but no vomiting.  No cough, chest pain, or SOB.  States overall he just feels very weak.  Denies dizziness with standing, falls, head trauma, etc.  Home Medications Prior to Admission medications   Medication Sig Start Date End Date Taking? Authorizing Provider  amLODipine (NORVASC) 10 MG tablet Take 1 tablet (10 mg total) by mouth daily. Patient taking differently: Take 10 mg by mouth at bedtime. 08/23/20   Almon Hercules, MD  ferrous sulfate 325 (65 FE) MG tablet Take 325 mg by mouth daily with supper.    [provider]  levothyroxine (SYNTHROID, LEVOTHROID) 50 MCG tablet Take 50 mcg by mouth daily before breakfast. 12/25/15   [provider]  oxyCODONE (ROXICODONE) 5 MG immediate release tablet Take 1 tablet (5 mg total) by mouth every 6 (six) hours as needed for severe pain (pain score 7-10) (post-operatively). 02/08/24 02/07/25  Loletta Parish., MD  pantoprazole (PROTONIX) 20 MG tablet Take 20 mg by mouth at bedtime. 12/20/15   [provider]  predniSONE (DELTASONE) 10 MG tablet 20 mg daily until follow up 02/15/24   [name], MD  psyllium (METAMUCIL) 58.6 % powder Take 0.5 packets by mouth every evening.     [provider]  rosuvastatin (CRESTOR) 5 MG tablet Take 5 mg by mouth at bedtime.    [provider]  senna-docusate (SENOKOT-S) 8.6-50 MG tablet Take 1 tablet by mouth 2 (two) times daily. While taking strong pain meds to prevent constipation 09/04/22   Manny, Delbert Phenix., MD  sodium bicarbonate 650 MG tablet Take 650 mg by mouth See admin instructions. Take 650 mg by mouth at noon and midnight 11/05/22   [provider]  TYLENOL 8 HOUR ARTHRITIS PAIN 650 MG CR tablet Take 1,300 mg by mouth every 8 (eight) hours as needed for pain.    [provider]      Allergies    Macrobid [nitrofurantoin], Ciprofloxacin, Keytruda [pembrolizumab], Tramadol hcl, Cephalexin, and [name] [mitomycin]    Review of Systems   Review of Systems  Genitourinary:  Positive for flank pain.  Neurological:  Positive for weakness.  All other systems reviewed and are negative.   Physical Exam Updated Vital Signs BP (!) 160/75   Pulse (!) 110   Temp 98.2 F (36.8 C) (Oral)   Resp 20   Ht 5\' 8"  (1.727 m)   Wt 81.6 kg   SpO2 99%   BMI 27.37 kg/m   Physical Exam Vitals and nursing note reviewed.  Constitutional:      Appearance: He is well-developed.  HENT:     Head: Normocephalic and atraumatic.  Eyes:     Conjunctiva/sclera: Conjunctivae normal.     Pupils: Pupils are equal, round, and  reactive to light.  Cardiovascular:     Rate and Rhythm: Normal rate and regular rhythm.     Heart sounds: Normal heart sounds.  Pulmonary:     Effort: Pulmonary effort is normal.     Breath sounds: Normal breath sounds.  Abdominal:     General: Bowel sounds are normal.     Palpations: Abdomen is soft.     Comments: Ureterostomy in place, draining yellow urine, non-bloody No rashes or skin abnormality noted to the right flank  Musculoskeletal:        General: Normal range of motion.     Cervical back: Normal range of motion.  Skin:    General: Skin is warm and dry.   Neurological:     Mental Status: He is alert and oriented to person, place, and time.     ED Results / Procedures / Treatments   Labs (all labs ordered are listed, but only abnormal results are displayed) Labs Reviewed  BASIC METABOLIC PANEL WITH GFR - Abnormal; Notable for the following components:      Result Value   CO2 20 (*)    Glucose, Bld 106 (*)    BUN 45 (*)    Creatinine, Ser 3.51 (*)    GFR, Estimated 16 (*)    All other components within normal limits  CBC - Abnormal; Notable for the following components:   WBC 12.7 (*)    RBC 3.72 (*)    Hemoglobin 11.4 (*)    HCT 36.5 (*)    RDW 17.0 (*)    All other components within normal limits  URINE CULTURE  RESP PANEL BY RT-PCR (RSV, FLU A&B, COVID)  [id number]  URINALYSIS, ROUTINE W REFLEX MICROSCOPIC  CBG MONITORING, ED    EKG None  Radiology No results found.  Procedures Procedures  {Document cardiac monitor, telemetry assessment procedure when appropriate:1}  Medications Ordered in ED Medications  sodium chloride 0.9 % bolus 1,000 mL (has no administration in time range)    ED Course/ Medical Decision Making/ A&P   {   Click here for ABCD2, HEART and other calculatorsREFRESH Note before signing :1}                              Medical Decision Making Amount and/or Complexity of Data Reviewed Labs: ordered. Radiology: ordered.   ***  {Document critical care time when appropriate:1} {Document review of labs and clinical decision tools ie heart score, Chads2Vasc2 etc:1}  {Document your independent review of radiology images, and any outside records:1} {Document your discussion with family members, caretakers, and with consultants:1} {Document social determinants of health affecting pt's care:1} {Document your decision making why or why not admission, treatments were needed:1} Final Clinical Impression(s) / ED Diagnoses Final diagnoses:  None    Rx / DC Orders ED Discharge Orders     None

## 2024-03-02 ENCOUNTER — Encounter: Payer: Self-pay | Admitting: *Deleted

## 2024-03-02 DIAGNOSIS — N400 Enlarged prostate without lower urinary tract symptoms: Secondary | ICD-10-CM | POA: Diagnosis present

## 2024-03-02 DIAGNOSIS — M160 Bilateral primary osteoarthritis of hip: Secondary | ICD-10-CM | POA: Diagnosis present

## 2024-03-02 DIAGNOSIS — N134 Hydroureter: Secondary | ICD-10-CM | POA: Diagnosis not present

## 2024-03-02 DIAGNOSIS — Z888 Allergy status to other drugs, medicaments and biological substances status: Secondary | ICD-10-CM | POA: Diagnosis not present

## 2024-03-02 DIAGNOSIS — N184 Chronic kidney disease, stage 4 (severe): Secondary | ICD-10-CM | POA: Diagnosis present

## 2024-03-02 DIAGNOSIS — Z1152 Encounter for screening for COVID-19: Secondary | ICD-10-CM | POA: Diagnosis not present

## 2024-03-02 DIAGNOSIS — I129 Hypertensive chronic kidney disease with stage 1 through stage 4 chronic kidney disease, or unspecified chronic kidney disease: Secondary | ICD-10-CM | POA: Diagnosis present

## 2024-03-02 DIAGNOSIS — M170 Bilateral primary osteoarthritis of knee: Secondary | ICD-10-CM | POA: Diagnosis present

## 2024-03-02 DIAGNOSIS — Z79890 Hormone replacement therapy (postmenopausal): Secondary | ICD-10-CM | POA: Diagnosis not present

## 2024-03-02 DIAGNOSIS — D631 Anemia in chronic kidney disease: Secondary | ICD-10-CM | POA: Diagnosis present

## 2024-03-02 DIAGNOSIS — Z8249 Family history of ischemic heart disease and other diseases of the circulatory system: Secondary | ICD-10-CM | POA: Diagnosis not present

## 2024-03-02 DIAGNOSIS — E785 Hyperlipidemia, unspecified: Secondary | ICD-10-CM | POA: Diagnosis present

## 2024-03-02 DIAGNOSIS — R82998 Other abnormal findings in urine: Secondary | ICD-10-CM | POA: Diagnosis not present

## 2024-03-02 DIAGNOSIS — K5730 Diverticulosis of large intestine without perforation or abscess without bleeding: Secondary | ICD-10-CM | POA: Diagnosis not present

## 2024-03-02 DIAGNOSIS — N1330 Unspecified hydronephrosis: Secondary | ICD-10-CM | POA: Diagnosis not present

## 2024-03-02 DIAGNOSIS — Z7952 Long term (current) use of systemic steroids: Secondary | ICD-10-CM | POA: Diagnosis not present

## 2024-03-02 DIAGNOSIS — T83598A Infection and inflammatory reaction due to other prosthetic device, implant and graft in urinary system, initial encounter: Secondary | ICD-10-CM | POA: Diagnosis present

## 2024-03-02 DIAGNOSIS — R1031 Right lower quadrant pain: Secondary | ICD-10-CM | POA: Diagnosis not present

## 2024-03-02 DIAGNOSIS — Z79899 Other long term (current) drug therapy: Secondary | ICD-10-CM | POA: Diagnosis not present

## 2024-03-02 DIAGNOSIS — Y838 Other surgical procedures as the cause of abnormal reaction of the patient, or of later complication, without mention of misadventure at the time of the procedure: Secondary | ICD-10-CM | POA: Diagnosis present

## 2024-03-02 DIAGNOSIS — Z936 Other artificial openings of urinary tract status: Secondary | ICD-10-CM | POA: Diagnosis not present

## 2024-03-02 DIAGNOSIS — K439 Ventral hernia without obstruction or gangrene: Secondary | ICD-10-CM | POA: Diagnosis not present

## 2024-03-02 DIAGNOSIS — E860 Dehydration: Secondary | ICD-10-CM | POA: Diagnosis present

## 2024-03-02 DIAGNOSIS — N390 Urinary tract infection, site not specified: Secondary | ICD-10-CM | POA: Diagnosis present

## 2024-03-02 DIAGNOSIS — Z85828 Personal history of other malignant neoplasm of skin: Secondary | ICD-10-CM | POA: Diagnosis not present

## 2024-03-02 DIAGNOSIS — E039 Hypothyroidism, unspecified: Secondary | ICD-10-CM | POA: Diagnosis present

## 2024-03-02 DIAGNOSIS — Z881 Allergy status to other antibiotic agents status: Secondary | ICD-10-CM | POA: Diagnosis not present

## 2024-03-02 DIAGNOSIS — N136 Pyonephrosis: Secondary | ICD-10-CM | POA: Diagnosis present

## 2024-03-02 DIAGNOSIS — K219 Gastro-esophageal reflux disease without esophagitis: Secondary | ICD-10-CM | POA: Diagnosis present

## 2024-03-02 DIAGNOSIS — N179 Acute kidney failure, unspecified: Secondary | ICD-10-CM | POA: Diagnosis not present

## 2024-03-02 MED ORDER — SENNA 8.6 MG PO TABS
1.0000 | ORAL_TABLET | Freq: Two times a day (BID) | ORAL | Status: DC
  Administered 2024-03-02 – 2024-03-03 (×3): 8.6 mg via ORAL
  Filled 2024-03-02 (×3): qty 1

## 2024-03-02 MED ORDER — FERROUS SULFATE 325 (65 FE) MG PO TABS
325.0000 mg | ORAL_TABLET | Freq: Every day | ORAL | Status: DC
Start: 2024-03-02 — End: 2024-03-03
  Administered 2024-03-02: 325 mg via ORAL
  Filled 2024-03-02: qty 1

## 2024-03-02 MED ORDER — ROSUVASTATIN CALCIUM 5 MG PO TABS
5.0000 mg | ORAL_TABLET | Freq: Every day | ORAL | Status: DC
  Administered 2024-03-02: 5 mg via ORAL
  Filled 2024-03-02: qty 1

## 2024-03-02 MED ORDER — SODIUM CHLORIDE 0.9 % IV SOLN
2.0000 g | INTRAVENOUS | Status: DC
  Administered 2024-03-03: 2 g via INTRAVENOUS
  Filled 2024-03-02: qty 20

## 2024-03-02 MED ORDER — ACETAMINOPHEN 650 MG RE SUPP: 650.0000 mg | Freq: Four times a day (QID) | RECTAL | Status: DC | PRN

## 2024-03-02 MED ORDER — LEVOTHYROXINE SODIUM 50 MCG PO TABS
50.0000 ug | ORAL_TABLET | Freq: Every day | ORAL | Status: DC
Start: 2024-03-02 — End: 2024-03-03
  Administered 2024-03-02 – 2024-03-03 (×2): 50 ug via ORAL
  Filled 2024-03-02 (×2): qty 1

## 2024-03-02 MED ORDER — PANTOPRAZOLE SODIUM 20 MG PO TBEC
20.0000 mg | DELAYED_RELEASE_TABLET | Freq: Every day | ORAL | Status: DC
  Administered 2024-03-02: 20 mg via ORAL
  Filled 2024-03-02: qty 1

## 2024-03-02 MED ORDER — OXYCODONE HCL 5 MG PO TABS
5.0000 mg | ORAL_TABLET | Freq: Four times a day (QID) | ORAL | Status: DC | PRN
  Administered 2024-03-02: 5 mg via ORAL
  Filled 2024-03-02: qty 1

## 2024-03-02 MED ORDER — ONDANSETRON HCL 4 MG PO TABS: 4.0000 mg | ORAL_TABLET | Freq: Four times a day (QID) | ORAL | Status: DC | PRN

## 2024-03-02 MED ORDER — AMLODIPINE BESYLATE 10 MG PO TABS
10.0000 mg | ORAL_TABLET | Freq: Every day | ORAL | Status: DC
  Administered 2024-03-02 – 2024-03-03 (×2): 10 mg via ORAL
  Filled 2024-03-02 (×2): qty 1

## 2024-03-02 MED ORDER — ACETAMINOPHEN 325 MG PO TABS: 650.0000 mg | ORAL_TABLET | Freq: Four times a day (QID) | ORAL | Status: DC | PRN

## 2024-03-02 MED ORDER — PSYLLIUM 95 % PO PACK
1.0000 | PACK | Freq: Every day | ORAL | Status: DC
  Administered 2024-03-02: 1 via ORAL
  Filled 2024-03-02 (×2): qty 1

## 2024-03-02 MED ORDER — SODIUM CHLORIDE 0.9 % IV SOLN: INTRAVENOUS | Status: AC

## 2024-03-02 MED ORDER — DOCUSATE SODIUM 100 MG PO CAPS
100.0000 mg | ORAL_CAPSULE | Freq: Two times a day (BID) | ORAL | Status: DC
Start: 2024-03-02 — End: 2024-03-03
  Administered 2024-03-02 – 2024-03-03 (×3): 100 mg via ORAL
  Filled 2024-03-02 (×3): qty 1

## 2024-03-02 MED ORDER — PREDNISONE 10 MG PO TABS
10.0000 mg | ORAL_TABLET | Freq: Every day | ORAL | Status: DC
  Administered 2024-03-02 – 2024-03-03 (×2): 10 mg via ORAL
  Filled 2024-03-02 (×2): qty 1

## 2024-03-02 MED ORDER — FINASTERIDE 5 MG PO TABS
5.0000 mg | ORAL_TABLET | Freq: Every evening | ORAL | Status: DC
  Administered 2024-03-02: 5 mg via ORAL
  Filled 2024-03-02: qty 1

## 2024-03-02 MED ORDER — SODIUM CHLORIDE 0.9 % IV SOLN
2.0000 g | Freq: Once | INTRAVENOUS | Status: AC
  Administered 2024-03-02: 2 g via INTRAVENOUS
  Filled 2024-03-02: qty 20

## 2024-03-02 MED ORDER — ONDANSETRON HCL 4 MG/2ML IJ SOLN
4.0000 mg | Freq: Four times a day (QID) | INTRAMUSCULAR | Status: DC | PRN
  Administered 2024-03-02: 4 mg via INTRAVENOUS
  Filled 2024-03-02: qty 2

## 2024-03-02 NOTE — Plan of Care (Signed)
Discuss and review plan of care with patient/family  

## 2024-03-02 NOTE — H&P (Signed)
 History and Physical    [name] [name] ZOX:[id number] DOB: 09-Apr-1932 DOA: 03/01/2024  PCP: [name], MD   Patient coming from: Home  I have personally briefly reviewed patient's old medical records in [facility]  Chief Complaint: Right flank pain following urological procedure.  HPI: [name] [name] is 89 y.o. male with PMH significant for hypertension, GERD, arthritis, CKD stage IIIb, history of bladder cancer s/p cystectomy and prostatectomy on 02/08/2024 by Dr. Lolita Lenz.  He did have ureterostomy in place,  has been having normal urine output.  Right ureteral stent was removed yesterday on 02/29/24.  Patient was feeling fine afterwards but starting today morning he started having pain in the right flank.He reports having subjective fevers and chills since associated with nausea but denies any vomiting. Patient not feeling well so he came in the ED.  ED Course: He was hemodynamically stable except tachycardia and hypertension. Temp 98.2, HR 110, RR 20, BP 160/76, SPO2 99% on room air Labs include sodium 140, potassium 4.2, chloride 107, bicarb 20, glucose 106, BUN 45, creatinine 3.51, calcium 9.1, anion gap 13, WBC 12.7, hemoglobin 14.4, hematocrit 36.5, MCV 98.1, platelet 241, influenza negative, RSV negative, coronavirus negative, UA leukocytes large, ketones negative, nitrates negative,  CT renal study: Status post cystoprostatectomy with right abdominal ureterostomy. There is mild right hydronephrosis and hydroureter, to the level of the urostomy. No obstructing stone. Small volume free fluid in the pelvis. Moderate gas within the subcutaneous soft tissues likely related to recent surgery. Small fluid collections in the right anterior abdominal wall and left lower quadrant, nonspecific in appearance and could be related to recent surgery. Small supraumbilical ventral hernia containing fat and segment of small bowel but no obstruction. Diverticular disease of the  sigmoid colon without acute inflammatory process.  Review of Systems: Review of Systems  Constitutional:  Positive for chills and fever.  HENT: Negative.    Eyes: Negative.   Respiratory: Negative.    Cardiovascular: Negative.   Gastrointestinal:  Positive for heartburn and nausea.  Genitourinary:  Positive for dysuria and flank pain.  Neurological: Negative.   Endo/Heme/Allergies: Negative.   Psychiatric/Behavioral: Negative.       Past Medical History:  Diagnosis Date   Anemia    Arthritis    lower back bulging disc, hips, knees, thumbs, shoulder   Asthma    as a pre teen   Bladder cancer (HCC) UROLOGIST-  DR MCKENZIE   S/P TURBT 08-16-2017   Bladder wall hemorrhage 09/28/2023   BPH (benign prostatic hyperplasia)    CKD (chronic kidney disease), stage III (HCC)    STAGE 3  B CKD per lov 10-16-2020 dr Thyra Breed Everlene Balls kidney on chart   Dyspnea    Fatigue    MIDDLE OF DAY ON OCCASION   Frequency of urination    GERD (gastroesophageal reflux disease)    on protonix   Hematuria    Hematuria    History of colon polyps    HLD (hyperlipidemia)    HOH (hard of hearing)    slightly hoh right worse thanleft   HTN (hypertension)    Hypothyroidism    Incontinence of urine    weras pads all the time   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   Right Buccal Cheek   Pre-diabetes    Recurrent bladder papillary carcinoma (HCC) hx 2012 and 2014-- urologist- dr Ronne Binning   s/p  TURBT 08-16-2017  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   Renal mass  09/02/2022   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 10/02/2023   Sleep apnea    uses cpap set on 20 /4   Trigger finger of left hand 07/04/2021   last few months per pt   UTI (urinary tract infection) 08/20/2020   Weakness of extremity    left knee   Wears glasses     Past Surgical History:  Procedure Laterality Date   BCG X 2     AFTER LAST 2 BLADDER TUMOR REMOVALS   CATARACT  EXTRACTION, BILATERAL     COLONSCOPY  YRS AGO   CYSTOSCOPY N/A 10/29/2020   Procedure: CYSTOSCOPY;  Surgeon: Noel Christmas, MD;  Location: [facility];  Service: Urology;  Laterality: N/A;   CYSTOSCOPY N/A 07/08/2021   Procedure: CYSTOSCOPY;  Surgeon: Noel Christmas, MD;  Location: [facility];  Service: Urology;  Laterality: N/A;   CYSTOSCOPY W/ RETROGRADES Right 07/21/2023   Procedure: CYSTOSCOPY WITH RIGHT RETROGRADE PYELOGRAM;  Surgeon: Loletta Parish., MD;  Location: [facility];  Service: Urology;  Laterality: Right;   CYSTOSCOPY W/ URETERAL STENT PLACEMENT Bilateral 08/16/2017   Procedure: CYSTOSCOPY WITH RETROGRADE PYELOGRAM/URETERAL STENT PLACEMENT;  Surgeon: Malen Gauze, MD;  Location: [facility];  Service: Urology;  Laterality: Bilateral;   CYSTOSCOPY W/ URETERAL STENT PLACEMENT Left 09/02/2022   Procedure: CYSTOSCOPY WITH RETROGRADE PYELOGRAM, URETERAL STENT PLACEMENT;  Surgeon: Sebastian Ache, MD;  Location: [facility];  Service: Urology;  Laterality: Left;   CYSTOSCOPY WITH BIOPSY N/A 06/25/2020   Procedure: CYSTOSCOPY FULGURATION   BLADDER  BIOPSY;  Surgeon: Noel Christmas, MD;  Location[facility];  Service: Urology;  Laterality: N/A;   CYSTOSCOPY WITH FULGERATION N/A 12/08/2019   Procedure: CYSTOSCOPY WITH FULGERATION;  Surgeon: Malen Gauze, MD;  Location: [facility];  Service: Urology;  Laterality: N/A;   CYSTOSCOPY WITH FULGERATION N/A 03/24/2022   Procedure: CYSTOSCOPY WITH FULGERATION;  Surgeon: Noel Christmas, MD;  Location: [facility];  Service: Urology;  Laterality: N/A;   CYSTOSCOPY WITH RETROGRADE PYELOGRAM, URETEROSCOPY AND STENT PLACEMENT Left 03/24/2022   Procedure: CYSTOSCOPY WITH RETROGRADE PYELOGRAM, DIAGNOSTIC URETEROSCOPY WITH BIOPSY AND STENT PLACEMENT;  Surgeon: Noel Christmas, MD;  Location: [facility];  Service: Urology;  Laterality: Left;  90 MINUTES   CYSTOSCOPY  WITH RETROGRADE PYELOGRAM, URETEROSCOPY AND STENT PLACEMENT Left 04/07/2022   Procedure: CYSTOSCOPY WITH  RETROGRADE PYELOGRAM, DIAGNOSTIC URETEROSCOPY WITH BIOPSY AND STENT EXCHANGE;  Surgeon: Noel Christmas, MD;  Location: [facility];  Service: Urology;  Laterality: Left;  75 MINUTES   CYSTOSCOPY WITH RETROGRADE PYELOGRAM, URETEROSCOPY AND STENT PLACEMENT Left 08/11/2022   Procedure: CYSTOSCOPY WITH RETROGRADE PYELOGRAM, ANTEGRADE NEPHROSTOGRAM, DIAGNOSTIC  URETEROSCOPY;  Surgeon: Noel Christmas, MD;  Location: [facility];  Service: Urology;  Laterality: Left;  90 MINS   HOLMIUM LASER APPLICATION Left 08/11/2022   Procedure: HOLMIUM LASER APPLICATION;  Surgeon: Noel Christmas, MD;  Location: [facility];  Service: Urology;  Laterality: Left;   IR NEPHROSTOMY EXCHANGE LEFT  05/15/2022   IR NEPHROSTOMY EXCHANGE LEFT  07/10/2022   IR NEPHROSTOMY PLACEMENT LEFT  05/06/2022   KNEE ARTHROSCOPY Left 06/10/2011   LYMPH NODE DISSECTION Left 09/02/2022   Procedure: RETROPERITONEAL LYMPH NODE DISSECTION;  Surgeon: Sebastian Ache, MD;  Location: [facility];  Service: Urology;  Laterality: Left;   PROSTATE BIOPSY  2000   ROBOT ASSITED LAPAROSCOPIC NEPHROURETERECTOMY Left 09/02/2022   Procedure: XI ROBOT ASSITED LAPAROSCOPIC NEPHROURETERECTOMY;  Surgeon: Sebastian Ache,  MD;  Location: [facility];  Service: Urology;  Laterality: Left;   ROBOT LAP RADICAL CYSTOPROSTATECTOMY PELVIC LYMPHADENECTOMY, NEOBLADDER N/A 02/04/2024   Procedure: ROBOT ASSISTED LAPAROSCOPIC RADICAL CYSTOPROSTATECTOMY;  Surgeon: Loletta Parish., MD;  Location: [facility];  Service: Urology;  Laterality: N/A;  300 MINUTES NEEDED FOR CASE   TRANSURETHRAL RESECTION OF BLADDER TUMOR N/A 08/16/2017   Procedure: TRANSURETHRAL RESECTION OF BLADDER TUMOR (TURBT);  Surgeon: Malen Gauze, MD;  Location: [facility];  Service: Urology;  Laterality: N/A;   TRANSURETHRAL RESECTION OF BLADDER TUMOR  12-09-2012   dr Marcelyn Bruins [facility]   and TURP    TRANSURETHRAL RESECTION OF BLADDER TUMOR N/A 09/20/2017   Procedure: TRANSURETHRAL RESECTION OF BLADDER TUMOR (TURBT);  Surgeon: Malen Gauze, MD;  Location: [facility];  Service: Urology;  Laterality: N/A;   TRANSURETHRAL RESECTION OF BLADDER TUMOR N/A 12/08/2019   Procedure: TRANSURETHRAL RESECTION OF BLADDER TUMOR;  Surgeon: Malen Gauze, MD;  Location: [facility];  Service: Urology;  Laterality: N/A;  1 HR   TRANSURETHRAL RESECTION OF BLADDER TUMOR N/A 10/29/2020   Procedure: BLADDER FULGERATION;  Surgeon: Noel Christmas, MD;  Location: [facility];  Service: Urology;  Laterality: N/A;  30 MINS   TRANSURETHRAL RESECTION OF BLADDER TUMOR N/A 07/08/2021   Procedure: TRANSURETHRAL RESECTION OF BLADDER TUMOR (TURBT)/FULGERATION;  Surgeon: Noel Christmas, MD;  Location: [facility];  Service: Urology;  Laterality: N/A;  45 MINS   TRANSURETHRAL RESECTION OF BLADDER TUMOR N/A 12/02/2021   Procedure: TRANSURETHRAL RESECTION OF BLADDER TUMOR (TURBT);  Surgeon: Noel Christmas, MD;  Location: [facility];  Service: Urology;  Laterality: N/A;   TRANSURETHRAL RESECTION OF BLADDER TUMOR N/A 07/21/2023   Procedure: TRANSURETHRAL RESECTION OF BLADDER TUMOR (TURBT);  Surgeon: Loletta Parish., MD;  Location: [facility];  Service: Urology;  Laterality: N/A;  60 MINUTES   TRANSURETHRAL RESECTION OF BLADDER TUMOR N/A 10/01/2023   Procedure: TRANSURETHRAL RESECTION OF BLADDER TUMOR (TURBT)/CLOT EVACUATION WITH FULGERATION;  Surgeon: Loletta Parish., MD;  Location: [facility];  Service: Urology;  Laterality: N/A;   TRANSURETHRAL RESECTION OF PROSTATE  2011   UPPER GI ENDOSCOPY  YRS AGO   URETEROSCOPY Right 02/04/2024   Procedure: RIGHT CUTANEOUS URETEROSCOPY;  Surgeon: Loletta Parish., MD;  Location: [facility];  Service: Urology;  Laterality: Right;     reports that he has never smoked. He has never used smokeless tobacco. He  reports that he does not currently use alcohol after a past usage of about 1.0 - 2.0 standard drink of alcohol per week. He reports that he does not use drugs.  Allergies  Allergen Reactions   Macrobid [Nitrofurantoin] Shortness Of Breath   Ciprofloxacin Diarrhea and Other (See Comments)    Occurred with 750 mg dose. Has since taken 500 mg doses and has had no reaction/diarrhea.   Keytruda [Pembrolizumab] Other (See Comments)    Weakness in both legs and fatigue   Tramadol Hcl Other (See Comments)    Pt unsure of reaction    Cephalexin Rash and Other (See Comments)    Rash on arms; tolerated Rocephin/cefpodoxime   [name] [Mitomycin] Rash and Other (See Comments)    All over body RASH; possibly triggered kidney REMOVAL and RESTLESS LEGS also    Family History  Problem Relation Age of Onset   Hypertension Mother    Breast cancer Mother    Other Mother  brain tumor   Alzheimer's disease Mother    Emphysema Father        smoker   Family history reviewed and not pertinent.  Prior to Admission medications   Medication Sig Start Date End Date Taking? Authorizing Provider  amLODipine (NORVASC) 10 MG tablet Take 1 tablet (10 mg total) by mouth daily. Patient taking differently: Take 10 mg by mouth at bedtime. 08/23/20  Yes Almon Hercules, MD  ferrous sulfate 325 (65 FE) MG tablet Take 325 mg by mouth daily with supper.   Yes [provider]  finasteride (PROSCAR) 5 MG tablet Take 5 mg by mouth every evening.   Yes [provider]  levothyroxine (SYNTHROID, LEVOTHROID) 50 MCG tablet Take 50 mcg by mouth daily before breakfast. 12/25/15  Yes [provider]  oxyCODONE (ROXICODONE) 5 MG immediate release tablet Take 1 tablet (5 mg total) by mouth every 6 (six) hours as needed for severe pain (pain score 7-10) (post-operatively). 02/08/24 02/07/25 Yes Manny, Delbert Phenix., MD  pantoprazole (PROTONIX) 20 MG tablet Take 20 mg by mouth at bedtime. 12/20/15  Yes  [provider]  predniSONE (DELTASONE) 10 MG tablet 20 mg daily until follow up Patient taking differently: Take 10 mg by mouth daily. 02/15/24  Yes [name], MD  rosuvastatin (CRESTOR) 5 MG tablet Take 5 mg by mouth at bedtime.   Yes [provider]  senna-docusate (SENOKOT-S) 8.6-50 MG tablet Take 1 tablet by mouth 2 (two) times daily. While taking strong pain meds to prevent constipation 09/04/22  Yes Manny, Delbert Phenix., MD  psyllium (METAMUCIL) 58.6 % powder Take 0.5 packets by mouth every evening.    [provider]  sodium bicarbonate 650 MG tablet Take 650 mg by mouth See admin instructions. Take 650 mg by mouth at noon and midnight 11/05/22   [provider]  TYLENOL 8 HOUR ARTHRITIS PAIN 650 MG CR tablet Take 1,300 mg by mouth every 8 (eight) hours as needed for pain.    [provider]    Physical Exam: Vitals:   03/02/24 0345 03/02/24 0534 03/02/24 0905 03/02/24 0908  BP: 132/69 131/70 136/71 136/71  Pulse: 95 99 95   Resp: 17 18 17    Temp: 98.1 F (36.7 C) 99.4 F (37.4 C) 98.8 F (37.1 C)   TempSrc:  Oral    SpO2: 97% 100% 100%   Weight:      Height:        Constitutional:  Appears calm, comfortable, not in any acute distress. Vitals:   03/02/24 0345 03/02/24 0534 03/02/24 0905 03/02/24 0908  BP: 132/69 131/70 136/71 136/71  Pulse: 95 99 95   Resp: 17 18 17    Temp: 98.1 F (36.7 C) 99.4 F (37.4 C) 98.8 F (37.1 C)   TempSrc:  Oral    SpO2: 97% 100% 100%   Weight:      Height:       Eyes: PERRL, lids and conjunctivae normal ENMT: Mucous membranes are moist.  Neck: normal, supple, no masses, no thyromegaly Respiratory: CTA  bilaterally, no wheezing, no crackles. Normal respiratory effort. No accessory muscle use.  Cardiovascular: S1+ S2 Normal, Regular rate and rhythm, no murmurs / rubs / gallops. No extremity edema.  Abdomen:Soft, Right flank tenderness++, no masses palpated. No hepatosplenomegaly. Bowel  sounds positive.  Musculoskeletal: no clubbing / cyanosis. No joint deformity upper and lower extremities. Good ROM, no contractures. Normal muscle tone.  Skin: no rashes, lesions, ulcers. No induration  Neurologic: CN 2-12 grossly intact. Sensation intact, DTR normal. Strength 5/5 in all 4.  Psychiatric: Normal judgment and insight. Alert and oriented x 3. Normal mood.    Labs on Admission: I have personally reviewed following labs and imaging studies  CBC: Recent Labs  Lab 03/01/24 2120  WBC 12.7*  HGB 11.4*  HCT 36.5*  MCV 98.1  PLT 241   Basic Metabolic Panel: Recent Labs  Lab 03/01/24 2120  NA 140  K 4.2  CL 107  CO2 20*  GLUCOSE 106*  BUN 45*  CREATININE 3.51*  CALCIUM 9.1   GFR: Estimated Creatinine Clearance: 13.8 mL/min (A) (by C-G formula based on SCr of 3.51 mg/dL (H)). Liver Function Tests: No results for input(s): "AST", "ALT", "ALKPHOS", "BILITOT", "PROT", "ALBUMIN" in the last 168 hours. No results for input(s): "LIPASE", "AMYLASE" in the last 168 hours. No results for input(s): "AMMONIA" in the last 168 hours. Coagulation Profile: No results for input(s): "INR", "PROTIME" in the last 168 hours. Cardiac Enzymes: No results for input(s): "CKTOTAL", "CKMB", "CKMBINDEX", "TROPONINI" in the last 168 hours. BNP (last 3 results) No results for input(s): "PROBNP" in the last 8760 hours. HbA1C: No results for input(s): "HGBA1C" in the last 72 hours. CBG: Recent Labs  Lab 03/01/24 2113  GLUCAP 91   Lipid Profile: No results for input(s): "CHOL", "HDL", "LDLCALC", "TRIG", "CHOLHDL", "LDLDIRECT" in the last 72 hours. Thyroid Function Tests: No results for input(s): "TSH", "T4TOTAL", "FREET4", "T3FREE", "THYROIDAB" in the last 72 hours. Anemia Panel: No results for input(s): "VITAMINB12", "FOLATE", "FERRITIN", "TIBC", "IRON", "RETICCTPCT" in the last 72 hours. Urine analysis:    Component Value Date/Time   COLORURINE YELLOW 03/01/2024 2309    APPEARANCEUR HAZY (A) 03/01/2024 2309   LABSPEC 1.009 03/01/2024 2309   PHURINE 6.0 03/01/2024 2309   GLUCOSEU NEGATIVE 03/01/2024 2309   HGBUR MODERATE (A) 03/01/2024 2309   BILIRUBINUR NEGATIVE 03/01/2024 2309   KETONESUR NEGATIVE 03/01/2024 2309   PROTEINUR 100 (A) 03/01/2024 2309   NITRITE NEGATIVE 03/01/2024 2309   LEUKOCYTESUR LARGE (A) 03/01/2024 2309    Radiological Exams on Admission: CT Renal Stone Study Result Date: 03/02/2024 CLINICAL DATA:  Flank pain recent bladder and prostate removal EXAM: CT ABDOMEN AND PELVIS WITHOUT CONTRAST TECHNIQUE: Multidetector CT imaging of the abdomen and pelvis was performed following the standard protocol without IV contrast. RADIATION DOSE REDUCTION: This exam was performed according to the departmental dose-optimization program which includes automated exposure control, adjustment of the mA and/or kV according to patient size and/or use of iterative reconstruction technique. COMPARISON:  CT 09/28/2023 FINDINGS: Lower chest: Lung bases demonstrate no acute airspace disease. Hepatobiliary: No focal liver abnormality is seen. No gallstones, gallbladder wall thickening, or biliary dilatation. Pancreas: Unremarkable. No pancreatic ductal dilatation or surrounding inflammatory changes. Spleen: Normal in size without focal abnormality. Adrenals/Urinary Tract: Right adrenal gland is normal. Status post left nephrectomy. Interval right abdominal ureterostomy. There is mild right hydronephrosis and hydroureter, to the level of the urostomy. No obstructing stone. Status post cystoprostatectomy. Stomach/Bowel: Stomach nonenlarged. There is no dilated small bowel. No acute bowel wall thickening. Diverticular disease of the sigmoid colon Vascular/Lymphatic: Aortic atherosclerosis. No enlarged abdominal or pelvic lymph nodes. Reproductive: Status post prostatectomy Other: No free air. Small volume free fluid in the pelvis. Moderate gas within the subcutaneous soft  tissues likely related to recent surgery. Tubular fluid collection in the right anterior abdominal wall measuring 2.9 x 1.5 cm on series 9, image 65 probably postoperative. Left lower quadrant small fluid  collection measuring 2.5 x 1.3 cm on series 2, image 43, no internal gas. Small supraumbilical ventral hernia containing fat and segment of small bowel but no obstruction. Small fat containing umbilical hernia. Small fat containing inguinal hernias. Small amount of gas within the right inguinal canal. Musculoskeletal: No acute or suspicious osseous abnormality IMPRESSION: 1. Status post cystoprostatectomy with right abdominal ureterostomy. There is mild right hydronephrosis and hydroureter, to the level of the urostomy. No obstructing stone. 2. Small volume free fluid in the pelvis. Moderate gas within the subcutaneous soft tissues likely related to recent surgery. Small fluid collections in the right anterior abdominal wall and left lower quadrant, nonspecific in appearance and could be related to recent surgery. 3. Small supraumbilical ventral hernia containing fat and segment of small bowel but no obstruction. 4. Diverticular disease of the sigmoid colon without acute inflammatory process. 5. Aortic atherosclerosis. Electronically Signed   By: Jasmine Pang M.D.   On: 03/02/2024 03:04   DG Chest 2 View Result Date: 03/01/2024 CLINICAL DATA:  Weakness. EXAM: CHEST - 2 VIEW COMPARISON:  03/16/2023 FINDINGS: The cardiomediastinal contours are normal. Aortic atherosclerosis. Subsegmental atelectasis at the left lung base. No confluent airspace disease. Pulmonary vasculature is normal. No pleural effusion or pneumothorax. No acute osseous abnormalities are seen. IMPRESSION: Subsegmental atelectasis at the left lung base. Electronically Signed   By: Narda Rutherford M.D.   On: 03/01/2024 23:53    EKG: Independently reviewed.  Sinus Tachycardia  Assessment/Plan Principal Problem:   Complicated UTI (urinary  tract infection) Active Problems:   CKD (chronic kidney disease) stage 4, GFR 15-29 ml/min (HCC) - Baseline Scr 3.0   Malignant neoplasm of urinary bladder (HCC)   Essential hypertension   GERD (gastroesophageal reflux disease)   Hypothyroidism   Anemia of chronic disease   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   Ureterostomy status (HCC)  Right flank pain: Complicated UTI: Patient presented with right flank pain following urological procedure. Patient has history of bladder cancer, recently underwent cystectomy and prostatectomy. Right ureteral stent was removed yesterday following which patient having pain. CT renal study shows mild hydroureteronephrosis. UA consistent with UTI started on empiric ceftriaxone. Follow-up urine culture.  Adequate pain control. Continue IV fluid resuscitation. Follow-up with urology.  AKI on CKD stage IIIb: Baseline serum creatinine between 2.8-3.  Presented with creatinine of 3.51. Likely secondary to above. Continue IV hydration. Avoid Nephrotoxic medications.  GERD: Continue pantoprazole 40 mg daily  Hypothyroidism: Continue Synthroid 50 mcg daily  Hyperlipidemia: Continue Crestor 5 mg daily  Essential hypertension: Continue amlodipine 10 mg daily   DVT prophylaxis: SCDs Code Status: Full code Family Communication: No family at bed side Disposition Plan:    Status is: Observation The patient remains OBS appropriate and will d/c before 2 midnights.   Consults called: Urology Admission status: Observation   Willeen Niece MD [facility]   If 7PM-7AM, please contact night-coverage   03/02/2024, 2:38 PM

## 2024-03-02 NOTE — Consult Note (Signed)
 Urology Consult Note   Requesting Attending Physician:  Willeen Niece, MD Service Providing Consult: Urology  Consulting Attending: Dr. Liliane Shi   Reason for Consult:  right flank pain  HPI: [name] [name] is seen in consultation for reasons noted above at the request of Willeen Niece, MD. patient presents to [facility] for right-sided flank pain, 2 days after his bander stent removal in clinic.  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  He then underwent cystoprostatectomy on 02/04/2024 for refractory high-grade bladder cancer (Keytruda intolerance) w/ (-)  margins.  On arrival [name] [name] was up in bed and looking well.  He was accompanied by both of his sons.  We reviewed his general case and all questions were answered to their satisfaction. Urology was consulted for acute on chronic kidney injury, severe right-sided flank pain, and UTI  ------------------  Assessment:  89 y.o. male with right flank pain. S/p left NephU in 2022, and cystoprostatectomy with ICG sentinal + template node dissection and Rt cutaneous ureterostomy 02/04/24 w/ Dr. Berneice Heinrich.    Recommendations: #right flank pain #AoCKD #UTI  Family described the right-sided flank pain as minimal and secondary to the rapid weakness and confusion that time experienced.  He is comfortable at this time.  There is a small amount of hydronephrosis and hydroureter to the level of the ostomy.  His renal function is the same as it has been in the hospital for the past week.  He has good urine output.  Ostomy is healthy.  Dr. Berneice Heinrich will review his imaging and if there is any concern for replacement stent can be considered.  [name] [name] reported discomfort started to build within about 12 hours of having his bander stent removed.  This transient discomfort could be attributable  to the pressure differential after stent removal.  His urine will likely be chronically colonized forever.  Do not find his urinalysis concerning and only rare bacteria were noted.  No labs were available today, but if vitals and WBC remain unremarkable, I would have a low threshold for discontinuation of antibiotics.  Will reevaluate in the morning and hopefully we can get him home.     Case and plan discussed with Dr. Liliane Shi and Dr. Berneice Heinrich  Past Medical History: Past Medical History:  Diagnosis Date   Anemia    Arthritis    lower back bulging disc, hips, knees, thumbs, shoulder   Asthma    as a pre teen   Bladder cancer (HCC) UROLOGIST-  DR MCKENZIE   S/P TURBT 08-16-2017   Bladder wall hemorrhage 09/28/2023   BPH (benign prostatic hyperplasia)    CKD (chronic kidney disease), stage III (HCC)    STAGE 3  B CKD per lov 10-16-2020 dr Thyra Breed Everlene Balls kidney on chart   Dyspnea    Fatigue    MIDDLE OF DAY ON OCCASION   Frequency of urination    GERD (gastroesophageal reflux disease)    on protonix   Hematuria    Hematuria    History of colon polyps    HLD (hyperlipidemia)    HOH (hard of hearing)    slightly hoh right worse thanleft   HTN (hypertension)    Hypothyroidism    Incontinence of urine    weras pads all the time   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   Right Buccal Cheek   Pre-diabetes    Recurrent  bladder papillary carcinoma (HCC) hx 2012 and 2014-- urologist- dr Ronne Binning   s/p  TURBT 08-16-2017  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   Renal mass 09/02/2022   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 10/02/2023   Sleep apnea    uses cpap set on 20 /4   Trigger finger of left hand 07/04/2021   last few months per pt   UTI (urinary tract infection) 08/20/2020   Weakness of extremity    left knee   Wears glasses     Past Surgical History:  Past Surgical History:  Procedure Laterality Date   BCG X 2      AFTER LAST 2 BLADDER TUMOR REMOVALS   CATARACT EXTRACTION, BILATERAL     COLONSCOPY  YRS AGO   CYSTOSCOPY N/A 10/29/2020   Procedure: CYSTOSCOPY;  Surgeon: Noel Christmas, MD;  Location: [facility];  Service: Urology;  Laterality: N/A;   CYSTOSCOPY N/A 07/08/2021   Procedure: CYSTOSCOPY;  Surgeon: Noel Christmas, MD;  Location: [facility];  Service: Urology;  Laterality: N/A;   CYSTOSCOPY W/ RETROGRADES Right 07/21/2023   Procedure: CYSTOSCOPY WITH RIGHT RETROGRADE PYELOGRAM;  Surgeon: Loletta Parish., MD;  Location: [facility];  Service: Urology;  Laterality: Right;   CYSTOSCOPY W/ URETERAL STENT PLACEMENT Bilateral 08/16/2017   Procedure: CYSTOSCOPY WITH RETROGRADE PYELOGRAM/URETERAL STENT PLACEMENT;  Surgeon: Malen Gauze, MD;  Location: [facility];  Service: Urology;  Laterality: Bilateral;   CYSTOSCOPY W/ URETERAL STENT PLACEMENT Left 09/02/2022   Procedure: CYSTOSCOPY WITH RETROGRADE PYELOGRAM, URETERAL STENT PLACEMENT;  Surgeon: Sebastian Ache, MD;  Location: [facility];  Service: Urology;  Laterality: Left;   CYSTOSCOPY WITH BIOPSY N/A 06/25/2020   Procedure: CYSTOSCOPY FULGURATION   BLADDER  BIOPSY;  Surgeon: Noel Christmas, MD;  Location: [facility];  Service: Urology;  Laterality: N/A;   CYSTOSCOPY WITH FULGERATION N/A 12/08/2019   Procedure: CYSTOSCOPY WITH FULGERATION;  Surgeon: Malen Gauze, MD;  Location: [facility]  Service: Urology;  Laterality: N/A;   CYSTOSCOPY WITH FULGERATION N/A 03/24/2022   Procedure: CYSTOSCOPY WITH FULGERATION;  Surgeon: Noel Christmas, MD;  Location: [facility];  Service: Urology;  Laterality: N/A;   CYSTOSCOPY WITH RETROGRADE PYELOGRAM, URETEROSCOPY AND STENT PLACEMENT Left 03/24/2022   Procedure: CYSTOSCOPY WITH RETROGRADE PYELOGRAM, DIAGNOSTIC URETEROSCOPY WITH BIOPSY AND STENT PLACEMENT;  Surgeon: Noel Christmas, MD;  Location: [facility];  Service:  Urology;  Laterality: Left;  90 MINUTES   CYSTOSCOPY WITH RETROGRADE PYELOGRAM, URETEROSCOPY AND STENT PLACEMENT Left 04/07/2022   Procedure: CYSTOSCOPY WITH  RETROGRADE PYELOGRAM, DIAGNOSTIC URETEROSCOPY WITH BIOPSY AND STENT EXCHANGE;  Surgeon: Noel Christmas, MD;  Location: [facility];  Service: Urology;  Laterality: Left;  75 MINUTES   CYSTOSCOPY WITH RETROGRADE PYELOGRAM, URETEROSCOPY AND STENT PLACEMENT Left 08/11/2022   Procedure: CYSTOSCOPY WITH RETROGRADE PYELOGRAM, ANTEGRADE NEPHROSTOGRAM, DIAGNOSTIC  URETEROSCOPY;  Surgeon: Noel Christmas, MD;  Location: [facility];  Service: Urology;  Laterality: Left;  90 MINS   HOLMIUM LASER APPLICATION Left 08/11/2022   Procedure: HOLMIUM LASER APPLICATION;  Surgeon: Noel Christmas, MD;  Location: [facility];  Service: Urology;  Laterality: Left;   IR NEPHROSTOMY EXCHANGE LEFT  05/15/2022   IR NEPHROSTOMY EXCHANGE LEFT  07/10/2022   IR NEPHROSTOMY PLACEMENT LEFT  05/06/2022   KNEE ARTHROSCOPY Left 06/10/2011   LYMPH NODE DISSECTION Left 09/02/2022   Procedure: RETROPERITONEAL LYMPH NODE DISSECTION;  Surgeon: Sebastian Ache, MD;  Location:  [facility];  Service: Urology;  Laterality: Left;   PROSTATE BIOPSY  2000   ROBOT ASSITED LAPAROSCOPIC NEPHROURETERECTOMY Left 09/02/2022   Procedure: XI ROBOT ASSITED LAPAROSCOPIC NEPHROURETERECTOMY;  Surgeon: Sebastian Ache, MD;  Location[facility];  Service: Urology;  Laterality: Left;   ROBOT LAP RADICAL CYSTOPROSTATECTOMY PELVIC LYMPHADENECTOMY, NEOBLADDER N/A 02/04/2024   Procedure: ROBOT ASSISTED LAPAROSCOPIC RADICAL CYSTOPROSTATECTOMY;  Surgeon: Loletta Parish., MD;  Location: [facility];  Service: Urology;  Laterality: N/A;  300 MINUTES NEEDED FOR CASE   TRANSURETHRAL RESECTION OF BLADDER TUMOR N/A 08/16/2017   Procedure: TRANSURETHRAL RESECTION OF BLADDER TUMOR (TURBT);  Surgeon: Malen Gauze, MD;  Location[facility];  Service: Urology;  Laterality: N/A;   TRANSURETHRAL RESECTION OF BLADDER  TUMOR  12-09-2012   dr Marcelyn Bruins [facility]   and TURP   TRANSURETHRAL RESECTION OF BLADDER TUMOR N/A 09/20/2017   Procedure: TRANSURETHRAL RESECTION OF BLADDER TUMOR (TURBT);  Surgeon: Malen Gauze, MD;  Location: [facility];  Service: Urology;  Laterality: N/A;   TRANSURETHRAL RESECTION OF BLADDER TUMOR N/A 12/08/2019   Procedure: TRANSURETHRAL RESECTION OF BLADDER TUMOR;  Surgeon: Malen Gauze, MD;  Location: [facility];  Service: Urology;  Laterality: N/A;  1 HR   TRANSURETHRAL RESECTION OF BLADDER TUMOR N/A 10/29/2020   Procedure: BLADDER FULGERATION;  Surgeon: Noel Christmas, MD;  Location: [facility];  Service: Urology;  Laterality: N/A;  30 MINS   TRANSURETHRAL RESECTION OF BLADDER TUMOR N/A 07/08/2021   Procedure: TRANSURETHRAL RESECTION OF BLADDER TUMOR (TURBT)/FULGERATION;  Surgeon: Noel Christmas, MD;  Location: [facility];  Service: Urology;  Laterality: N/A;  45 MINS   TRANSURETHRAL RESECTION OF BLADDER TUMOR N/A 12/02/2021   Procedure: TRANSURETHRAL RESECTION OF BLADDER TUMOR (TURBT);  Surgeon: Noel Christmas, MD;  Location[facility];  Service: Urology;  Laterality: N/A;   TRANSURETHRAL RESECTION OF BLADDER TUMOR N/A 07/21/2023   Procedure: TRANSURETHRAL RESECTION OF BLADDER TUMOR (TURBT);  Surgeon: Loletta Parish., MD;  Location[facility];  Service: Urology;  Laterality: N/A;  60 MINUTES   TRANSURETHRAL RESECTION OF BLADDER TUMOR N/A 10/01/2023   Procedure: TRANSURETHRAL RESECTION OF BLADDER TUMOR (TURBT)/CLOT EVACUATION WITH FULGERATION;  Surgeon: Loletta Parish., MD;  Location[facility];  Service: Urology;  Laterality: N/A;   TRANSURETHRAL RESECTION OF PROSTATE  2011   UPPER GI ENDOSCOPY  YRS AGO   URETEROSCOPY Right 02/04/2024   Procedure: RIGHT CUTANEOUS URETEROSCOPY;  Surgeon: Loletta Parish., MD;  Location[facility];  Service: Urology;  Laterality: Right;    Medication: Current  Facility-Administered Medications  Medication Dose Route Frequency Provider Last Rate Last Admin   0.9 %  sodium chloride infusion   Intravenous Continuous Emokpae, Courage, MD 83 mL/hr at 03/02/24 0403 New Bag at 03/02/24 0403   0.9 %  sodium chloride infusion   Intravenous Continuous Willeen Niece, MD 40 mL/hr at 03/02/24 0901 Rate Change at 03/02/24 0901   acetaminophen (TYLENOL) tablet 650 mg  650 mg Oral Q6H PRN Willeen Niece, MD       Or   acetaminophen (TYLENOL) suppository 650 mg  650 mg Rectal Q6H PRN Willeen Niece, MD       amLODipine (NORVASC) tablet 10 mg  10 mg Oral Daily Willeen Niece, MD   10 mg at 03/02/24 0908   [START ON 03/03/2024] cefTRIAXone (ROCEPHIN) 2 g in sodium chloride 0.9 % 100 mL IVPB  2 g Intravenous Q24H  Shon Hale, MD       docusate sodium (COLACE) capsule 100 mg  100 mg Oral BID Willeen Niece, MD   100 mg at 03/02/24 1610   ferrous sulfate tablet 325 mg  325 mg Oral Q supper Willeen Niece, MD       finasteride (PROSCAR) tablet 5 mg  5 mg Oral QPM Willeen Niece, MD       levothyroxine (SYNTHROID) tablet 50 mcg  50 mcg Oral QAC breakfast Willeen Niece, MD   50 mcg at 03/02/24 0907   ondansetron (ZOFRAN) tablet 4 mg  4 mg Oral Q6H PRN Willeen Niece, MD       Or   ondansetron (ZOFRAN) injection 4 mg  4 mg Intravenous Q6H PRN Willeen Niece, MD   4 mg at 03/02/24 9604   oxyCODONE (Oxy IR/ROXICODONE) immediate release tablet 5 mg  5 mg Oral Q6H PRN Willeen Niece, MD   5 mg at 03/02/24 5409   pantoprazole (PROTONIX) EC tablet 20 mg  20 mg Oral QHS Khatri, Pardeep, MD       predniSONE (DELTASONE) tablet 10 mg  10 mg Oral Daily Willeen Niece, MD   10 mg at 03/02/24 0908   psyllium (HYDROCIL/METAMUCIL) 1 packet  1 packet Oral Daily Willeen Niece, MD   1 packet at 03/02/24 8119   rosuvastatin (CRESTOR) tablet 5 mg  5 mg Oral QHS Willeen Niece, MD       senna (SENOKOT) tablet 8.6 mg  1 tablet Oral BID Willeen Niece, MD   8.6 mg at 03/02/24  0907    Allergies: Allergies  Allergen Reactions   Macrobid [Nitrofurantoin] Shortness Of Breath   Ciprofloxacin Diarrhea and Other (See Comments)    Occurred with 750 mg dose. Has since taken 500 mg doses and has had no reaction/diarrhea.   Keytruda [Pembrolizumab] Other (See Comments)    Weakness in both legs and fatigue   Tramadol Hcl Other (See Comments)    Pt unsure of reaction    Cephalexin Rash and Other (See Comments)    Rash on arms; tolerated Rocephin/cefpodoxime   [name] [Mitomycin] Rash and Other (See Comments)    All over body RASH; possibly triggered kidney REMOVAL and RESTLESS LEGS also    Social History: Social History   Tobacco Use   Smoking status: Never   Smokeless tobacco: Never  Vaping Use   Vaping status: Never Used  Substance Use Topics   Alcohol use: Not Currently    Alcohol/week: 1.0 - 2.0 standard drink of alcohol    Types: 1 - 2 Glasses of wine per week    Comment: rare   Drug use: No    Family History Family History  Problem Relation Age of Onset   Hypertension Mother    Breast cancer Mother    Other Mother        brain tumor   Alzheimer's disease Mother    Emphysema Father        smoker    Review of Systems  Genitourinary:  Negative for dysuria, flank pain, frequency, hematuria and urgency.     Objective   Vital signs in last 24 hours: BP 136/71   Pulse 95   Temp 98.8 F (37.1 C)   Resp 17   Ht 5\' 8"  (1.727 m)   Wt 81.6 kg   SpO2 100%   BMI 27.37 kg/m   Physical Exam General: A&O, resting, appropriate HEENT: [place]/AT Pulmonary: Normal work of breathing Cardiovascular: no cyanosis Abdomen: Soft, NTTP,  nondistended GU: Urostomy beefy pink and healthy, clear yellow urine.  Good UOP Neuro: Appropriate, no focal neurological deficits  Most Recent Labs: Lab Results  Component Value Date   WBC 12.7 (H) 03/01/2024   HGB 11.4 (L) 03/01/2024   HCT 36.5 (L) 03/01/2024   PLT 241 03/01/2024    Lab Results  Component  Value Date   NA 140 03/01/2024   K 4.2 03/01/2024   CL 107 03/01/2024   CO2 20 (L) 03/01/2024   BUN 45 (H) 03/01/2024   CREATININE 3.51 (H) 03/01/2024   CALCIUM 9.1 03/01/2024   MG 1.8 11/13/2023   PHOS 4.8 (H) 08/23/2020    Lab Results  Component Value Date   INR 1.0 05/06/2022     Urine Culture: @[id number] (laburin,org,[id number],[id number])@   IMAGING: CT Renal Stone Study Result Date: 03/02/2024 CLINICAL DATA:  Flank pain recent bladder and prostate removal EXAM: CT ABDOMEN AND PELVIS WITHOUT CONTRAST TECHNIQUE: Multidetector CT imaging of the abdomen and pelvis was performed following the standard protocol without IV contrast. RADIATION DOSE REDUCTION: This exam was performed according to the departmental dose-optimization program which includes automated exposure control, adjustment of the mA and/or kV according to patient size and/or use of iterative reconstruction technique. COMPARISON:  CT 09/28/2023 FINDINGS: Lower chest: Lung bases demonstrate no acute airspace disease. Hepatobiliary: No focal liver abnormality is seen. No gallstones, gallbladder wall thickening, or biliary dilatation. Pancreas: Unremarkable. No pancreatic ductal dilatation or surrounding inflammatory changes. Spleen: Normal in size without focal abnormality. Adrenals/Urinary Tract: Right adrenal gland is normal. Status post left nephrectomy. Interval right abdominal ureterostomy. There is mild right hydronephrosis and hydroureter, to the level of the urostomy. No obstructing stone. Status post cystoprostatectomy. Stomach/Bowel: Stomach nonenlarged. There is no dilated small bowel. No acute bowel wall thickening. Diverticular disease of the sigmoid colon Vascular/Lymphatic: Aortic atherosclerosis. No enlarged abdominal or pelvic lymph nodes. Reproductive: Status post prostatectomy Other: No free air. Small volume free fluid in the pelvis. Moderate gas within the subcutaneous soft tissues likely related to recent surgery.  Tubular fluid collection in the right anterior abdominal wall measuring 2.9 x 1.5 cm on series 9, image 65 probably postoperative. Left lower quadrant small fluid collection measuring 2.5 x 1.3 cm on series 2, image 43, no internal gas. Small supraumbilical ventral hernia containing fat and segment of small bowel but no obstruction. Small fat containing umbilical hernia. Small fat containing inguinal hernias. Small amount of gas within the right inguinal canal. Musculoskeletal: No acute or suspicious osseous abnormality IMPRESSION: 1. Status post cystoprostatectomy with right abdominal ureterostomy. There is mild right hydronephrosis and hydroureter, to the level of the urostomy. No obstructing stone. 2. Small volume free fluid in the pelvis. Moderate gas within the subcutaneous soft tissues likely related to recent surgery. Small fluid collections in the right anterior abdominal wall and left lower quadrant, nonspecific in appearance and could be related to recent surgery. 3. Small supraumbilical ventral hernia containing fat and segment of small bowel but no obstruction. 4. Diverticular disease of the sigmoid colon without acute inflammatory process. 5. Aortic atherosclerosis. Electronically Signed   By: Jasmine Pang M.D.   On: 03/02/2024 03:04   DG Chest 2 View Result Date: 03/01/2024 CLINICAL DATA:  Weakness. EXAM: CHEST - 2 VIEW COMPARISON:  03/16/2023 FINDINGS: The cardiomediastinal contours are normal. Aortic atherosclerosis. Subsegmental atelectasis at the left lung base. No confluent airspace disease. Pulmonary vasculature is normal. No pleural effusion or pneumothorax. No acute osseous abnormalities are seen. IMPRESSION: Subsegmental atelectasis at the  left lung base. Electronically Signed   By: Narda Rutherford M.D.   On: 03/01/2024 23:53    ------  [name], NP Pager: [phone]   Please contact the urology consult pager with any further questions/concerns.

## 2024-03-02 NOTE — TOC Initial Note (Signed)
 Transition of Care [facility]) - Initial/Assessment Note    Patient Details  Name: [name] [name] MRN: [id number] Date of Birth: 01-Nov-1932  [facility]) CM/SW Contact:    Jessie Foot, RN Phone Number: 03/02/2024, 3:43 PM  Clinical Narrative:                 Presented for UTI. NCM enter room, pt in bed with 2 sons at bedside; lives in single family home with son [name] [phone]) and daughter in-law;verified PCP and insurance. DME; cane, rollator, [employer], [employer]. Denies HH. Discharge transportation will be son [name]) via private vehicle. No TOC needs identified for during visit. TOC will follow for progression for discharge.  Expected Discharge Plan: Home/Self Care Barriers to Discharge: Continued Medical Work up   Patient Goals and CMS Choice Patient states their goals for this hospitalization and ongoing recovery are:: Return home with son and daughter in-law          Expected Discharge Plan and Services In-house Referral: NA Discharge Planning Services: CM Consult Post Acute Care Choice: NA Living arrangements for the past 2 months: Single Family Home                 DME Arranged: N/A DME Agency: NA       HH Arranged: NA HH Agency: NA        Prior Living Arrangements/Services Living arrangements for the past 2 months: Single Family Home Lives with:: Adult Children Patient language and need for interpreter reviewed:: Yes Do you feel safe going back to the place where you live?: Yes      Need for Family Participation in Patient Care: No (Comment) Care giver support system in place?: Yes (comment) Current home services: DME (cane, rollator, [place], [employer]) Criminal Activity/Legal Involvement Pertinent to Current Situation/Hospitalization: No - Comment as needed  Activities of Daily Living   ADL Screening (condition at time of admission) Independently performs ADLs?: Yes (appropriate for developmental age) Is the patient deaf or have difficulty hearing?:  No Does the patient have difficulty seeing, even when wearing glasses/contacts?: No Does the patient have difficulty concentrating, remembering, or making decisions?: No  Permission Sought/Granted Permission sought to share information with : Case Manager Permission granted to share information with : Yes, Verbal Permission Granted  Share Information with NAME: [name] [place]     Permission granted to share info w Relationship: son  Permission granted to share info w Contact Information: [phone]  Emotional Assessment Appearance:: Appears stated age Attitude/Demeanor/Rapport: Engaged Affect (typically observed): Accepting, Appropriate Orientation: : Oriented to Self, Oriented to Place, Oriented to  Time, Oriented to Situation Alcohol / Substance Use: Not Applicable Psych Involvement: No (comment)  Admission diagnosis:  UTI (urinary tract infection) [N39.0] Right flank pain [R10.9] Generalized weakness [R53.1] Complicated UTI (urinary tract infection) [N39.0] Patient Active Problem List   Diagnosis Date Noted   UTI (urinary tract infection) 03/02/2024   Complicated UTI (urinary tract infection) 03/02/2024   Ureterostomy status (HCC) 02/15/2024   Bladder cancer (HCC) 02/04/2024   History of iron deficiency 12/20/2023   Dysuria 10/15/2023   AKI (acute kidney injury) (HCC) 10/08/2023   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 10/02/2023   Decreased appetite 09/28/2023   Hardening of the aorta (main artery of the heart) (HCC) 09/28/2023   Mixed hyperlipidemia 09/28/2023   Prediabetes 09/28/2023   Panic attack 09/28/2023   Pedal edema 09/28/2023   Osteoarthritis 09/28/2023   Sinusitis chronic, sphenoidal 09/28/2023   Acute blood loss  anemia (ABLA) 09/28/2023   Hypothyroidism 09/28/2023   Low serum vitamin B12 09/10/2023   Anemia of chronic disease 09/10/2023   Normocytic anemia 08/06/2023   Unintentional weight loss 04/09/2021   Leg weakness, bilateral 04/09/2021    Surgical counseling visit 04/09/2021   Essential hypertension 05/23/2020   Sleep apnea treated with nocturnal BiPAP 01/25/2020   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   Hypersomnia with sleep apnea 09/15/2019   Cardiac arrhythmia due to premature depolarization 09/15/2019   Excessive daytime sleepiness 09/15/2019   CKD (chronic kidney disease) stage 4, GFR 15-29 ml/min (HCC) - Baseline Scr 3.0 09/15/2019   Malignant neoplasm of urinary bladder (HCC) 09/15/2019   Gross hematuria 09/11/2017   Benign prostatic hyperplasia without lower urinary tract symptoms 12/09/2012   GERD (gastroesophageal reflux disease) 11/28/2012   PCP:  [name], MD Pharmacy:   [facility] [id number] [place] Otto, [place] - [address] [place] [address] Phone: [phone] Fax: [phone]     Social Drivers of Health (SDOH) Social History: SDOH Screenings   Food Insecurity: No Food Insecurity (03/02/2024)  Housing: Low Risk  (03/02/2024)  Transportation Needs: No Transportation Needs (03/02/2024)  Utilities: Not At Risk (03/02/2024)  Recent Concern: Utilities - At Risk (02/09/2024)  Depression (PHQ2-9): Low Risk  (11/16/2023)  Financial Resource Strain: Low Risk  (10/12/2023)  Physical Activity: Insufficiently Active (10/12/2023)  Social Connections: Moderately Isolated (02/09/2024)  Tobacco Use: Low Risk  (03/01/2024)  Health Literacy: Adequate Health Literacy (02/09/2024)   SDOH Interventions:     Readmission Risk Interventions    02/08/2024   11:10 AM  Readmission Risk Prevention Plan  Transportation Screening Complete  HRI or Home Care Consult Complete  Social Work Consult for Recovery Care Planning/Counseling Complete  Palliative Care Screening Not Applicable  Medication Review Oceanographer) Complete

## 2024-03-02 NOTE — Patient Outreach (Signed)
 Patient was scheduled for outreach today, unfortunately, he is currently admitted to hospital.  Hospital liaison notified, will follow up with patient pending disposition.   Rodney Langton, RN, MSN, CCM [facility], [facility] RN Care Coordinator Direct Dial: [phone] / Main [phone] Fax [phone] Email: [email or web address]

## 2024-03-02 NOTE — Plan of Care (Signed)

## 2024-03-03 DIAGNOSIS — N390 Urinary tract infection, site not specified: Secondary | ICD-10-CM | POA: Diagnosis not present

## 2024-03-03 DIAGNOSIS — C61 Malignant neoplasm of prostate: Secondary | ICD-10-CM | POA: Insufficient documentation

## 2024-03-03 LAB — PHOSPHORUS: Phosphorus: 3.9 mg/dL (ref 2.5–4.6)

## 2024-03-03 LAB — CBC
HCT: 28.7 % — ABNORMAL LOW (ref 39.0–52.0)
Hemoglobin: 8.8 g/dL — ABNORMAL LOW (ref 13.0–17.0)
MCH: 31 pg (ref 26.0–34.0)
MCHC: 30.7 g/dL (ref 30.0–36.0)
MCV: 101.1 fL — ABNORMAL HIGH (ref 80.0–100.0)
Platelets: 158 10*3/uL (ref 150–400)
RBC: 2.84 MIL/uL — ABNORMAL LOW (ref 4.22–5.81)
RDW: 16.7 % — ABNORMAL HIGH (ref 11.5–15.5)
WBC: 7.9 10*3/uL (ref 4.0–10.5)
nRBC: 0 % (ref 0.0–0.2)

## 2024-03-03 LAB — COMPREHENSIVE METABOLIC PANEL WITH GFR
ALT: 13 U/L (ref 0–44)
AST: 14 U/L — ABNORMAL LOW (ref 15–41)
Albumin: 2.7 g/dL — ABNORMAL LOW (ref 3.5–5.0)
Alkaline Phosphatase: 43 U/L (ref 38–126)
Anion gap: 9 (ref 5–15)
BUN: 47 mg/dL — ABNORMAL HIGH (ref 8–23)
CO2: 19 mmol/L — ABNORMAL LOW (ref 22–32)
Calcium: 8.3 mg/dL — ABNORMAL LOW (ref 8.9–10.3)
Chloride: 109 mmol/L (ref 98–111)
Creatinine, Ser: 3.05 mg/dL — ABNORMAL HIGH (ref 0.61–1.24)
GFR, Estimated: 19 mL/min — ABNORMAL LOW (ref >60)
Glucose, Bld: 131 mg/dL — ABNORMAL HIGH (ref 70–99)
Potassium: 4.1 mmol/L (ref 3.5–5.1)
Sodium: 137 mmol/L (ref 135–145)
Total Bilirubin: 0.7 mg/dL (ref 0.0–1.2)
Total Protein: 5.2 g/dL — ABNORMAL LOW (ref 6.5–8.1)

## 2024-03-03 LAB — MAGNESIUM: Magnesium: 1.4 mg/dL — ABNORMAL LOW (ref 1.7–2.4)

## 2024-03-03 MED ORDER — ALBUMIN HUMAN 5 % IV SOLN
25.0000 g | Freq: Once | INTRAVENOUS | Status: AC
  Administered 2024-03-03: 25 g via INTRAVENOUS
  Filled 2024-03-03: qty 500

## 2024-03-03 MED ORDER — CIPROFLOXACIN HCL 500 MG PO TABS: 500.0000 mg | ORAL_TABLET | Freq: Every day | ORAL | 0 refills | Status: AC

## 2024-03-03 MED ORDER — MAGNESIUM SULFATE 2 GM/50ML IV SOLN
2.0000 g | Freq: Once | INTRAVENOUS | Status: AC
  Administered 2024-03-03: 2 g via INTRAVENOUS
  Filled 2024-03-03: qty 50

## 2024-03-03 NOTE — Progress Notes (Signed)
 Subjective: [name] [name]. Pt looks well this am. Right leg pain has resolved  Objective: Vital signs in last 24 hours: Temp:  [98.3 F (36.8 C)-98.8 F (37.1 C)] 98.3 F (36.8 C) (04/11 0456) Pulse Rate:  [73-95] 73 (04/11 0456) Resp:  [14-17] 16 (04/11 0456) BP: (112-136)/(64-71) 117/70 (04/11 0456) SpO2:  [97 %-100 %] 97 % (04/11 0456)  Assessment/Plan: #right flank pain #AoCKD #UTI  SCr down to 3.05. Favoring dehydration compounded by third spacing. Dr. Berneice Heinrich has provided him with Albumin this am.   GNR on UC, re-incubating culture. Continuing empiric ABX.   Have asked nursing to have him ambulate prior to discharge.  Ok to discharge later today pending hospitalist assessment.   Intake/Output from previous day: 04/10 0701 - 04/11 0700 In: 1931 [P.O.:1040; I.V.:891] Out: 1410 [Urine:1410]  Intake/Output this shift: No intake/output data recorded.  Physical Exam:  General: Alert and oriented CV: No cyanosis Lungs: equal chest rise Abdomen: Soft, NTND, no rebound or guarding: Gu: urostomy healthy, clear yellow urine. Good UOP  Lab Results: Recent Labs    03/01/24 2120 03/03/24 0339  HGB 11.4* 8.8*  HCT 36.5* 28.7*   BMET Recent Labs    03/01/24 2120 03/03/24 0339  NA 140 137  K 4.2 4.1  CL 107 109  CO2 20* 19*  GLUCOSE 106* 131*  BUN 45* 47*  CREATININE 3.51* 3.05*  CALCIUM 9.1 8.3*     Studies/Results: CT Renal Stone Study Result Date: 03/02/2024 CLINICAL DATA:  Flank pain recent bladder and prostate removal EXAM: CT ABDOMEN AND PELVIS WITHOUT CONTRAST TECHNIQUE: Multidetector CT imaging of the abdomen and pelvis was performed following the standard protocol without IV contrast. RADIATION DOSE REDUCTION: This exam was performed according to the departmental dose-optimization program which includes automated exposure control, adjustment of the mA and/or kV according to patient size and/or use of iterative reconstruction technique. COMPARISON:  CT  09/28/2023 FINDINGS: Lower chest: Lung bases demonstrate no acute airspace disease. Hepatobiliary: No focal liver abnormality is seen. No gallstones, gallbladder wall thickening, or biliary dilatation. Pancreas: Unremarkable. No pancreatic ductal dilatation or surrounding inflammatory changes. Spleen: Normal in size without focal abnormality. Adrenals/Urinary Tract: Right adrenal gland is normal. Status post left nephrectomy. Interval right abdominal ureterostomy. There is mild right hydronephrosis and hydroureter, to the level of the urostomy. No obstructing stone. Status post cystoprostatectomy. Stomach/Bowel: Stomach nonenlarged. There is no dilated small bowel. No acute bowel wall thickening. Diverticular disease of the sigmoid colon Vascular/Lymphatic: Aortic atherosclerosis. No enlarged abdominal or pelvic lymph nodes. Reproductive: Status post prostatectomy Other: No free air. Small volume free fluid in the pelvis. Moderate gas within the subcutaneous soft tissues likely related to recent surgery. Tubular fluid collection in the right anterior abdominal wall measuring 2.9 x 1.5 cm on series 9, image 65 probably postoperative. Left lower quadrant small fluid collection measuring 2.5 x 1.3 cm on series 2, image 43, no internal gas. Small supraumbilical ventral hernia containing fat and segment of small bowel but no obstruction. Small fat containing umbilical hernia. Small fat containing inguinal hernias. Small amount of gas within the right inguinal canal. Musculoskeletal: No acute or suspicious osseous abnormality IMPRESSION: 1. Status post cystoprostatectomy with right abdominal ureterostomy. There is mild right hydronephrosis and hydroureter, to the level of the urostomy. No obstructing stone. 2. Small volume free fluid in the pelvis. Moderate gas within the subcutaneous soft tissues likely related to recent surgery. Small fluid collections in the right anterior abdominal wall and left lower quadrant,  nonspecific in appearance and could be related to recent surgery. 3. Small supraumbilical ventral hernia containing fat and segment of small bowel but no obstruction. 4. Diverticular disease of the sigmoid colon without acute inflammatory process. 5. Aortic atherosclerosis. Electronically Signed   By: Jasmine Pang M.D.   On: 03/02/2024 03:04   DG Chest 2 View Result Date: 03/01/2024 CLINICAL DATA:  Weakness. EXAM: CHEST - 2 VIEW COMPARISON:  03/16/2023 FINDINGS: The cardiomediastinal contours are normal. Aortic atherosclerosis. Subsegmental atelectasis at the left lung base. No confluent airspace disease. Pulmonary vasculature is normal. No pleural effusion or pneumothorax. No acute osseous abnormalities are seen. IMPRESSION: Subsegmental atelectasis at the left lung base. Electronically Signed   By: Narda Rutherford M.D.   On: 03/01/2024 23:53      LOS: 1 day   [name], NP [facility] Pager: [phone]  03/03/2024, 8:07 AM

## 2024-03-03 NOTE — Progress Notes (Signed)
 [facility] Liaison Note  03/03/2024  [name] [name] 1932/05/09 [id number]  Location: RN Hospital Liaison screened the patient remotely at [facility].  Insurance: [employer] [name] [name] is a 89 y.o. male who is a Primary Care Patient of [name], MD [employer]. The patient was screened for 30 day readmission hospitalization with noted high risk score for unplanned readmission risk with 2 IP/2 ED in 6 months.  Patient is currently active with Care Management for chronic disease management services.  Patient has been engaged by a Corporate investment banker.  Our community based plan of care has focused on disease management and community resource support.   Patient will receive a post hospital call and will be evaluated for assessments and disease process education.   Inpatient Transition Of Care [TOC] team member to make aware that Care Management following.  Plan: pending disposition   [facility] Care Management/Population Health does not replace or interfere with any arrangements made by the Inpatient Transition of Care team.   For questions contact:   Elliot Cousin, RN, BSN Hospital Liaison [place]   [facility], Population Health Office Hours MTWF  8:00 am-6:00 pm Direct Dial: [phone] mobile [email or web address]

## 2024-03-03 NOTE — Plan of Care (Signed)

## 2024-03-03 NOTE — TOC Transition Note (Signed)
 Transition of Care [facility]) - Discharge Note   Patient Details  Name: [name] [name] MRN: [id number] Date of Birth: 01-05-1932  Transition of Care [facility]) CM/SW Contact:  Jessie Foot, RN Phone Number: 03/03/2024, 2:22 PM   Clinical Narrative:    TOC signed off.   Final next level of care: Home/Self Care Barriers to Discharge: Barriers Resolved   Patient Goals and CMS Choice Patient states their goals for this hospitalization and ongoing recovery are:: Return home with son and daughter in-law          Discharge Placement                       Discharge Plan and Services Additional resources added to the After Visit Summary for   In-house Referral: NA Discharge Planning Services: CM Consult Post Acute Care Choice: NA          DME Arranged: N/A DME Agency: NA       HH Arranged: NA HH Agency: NA        Social Drivers of Health (SDOH) Interventions SDOH Screenings   Food Insecurity: No Food Insecurity (03/02/2024)  Housing: Low Risk  (03/02/2024)  Transportation Needs: No Transportation Needs (03/02/2024)  Utilities: Not At Risk (03/02/2024)  Recent Concern: Utilities - At Risk (02/09/2024)  Depression (PHQ2-9): Low Risk  (11/16/2023)  Financial Resource Strain: Low Risk  (10/12/2023)  Physical Activity: Insufficiently Active (10/12/2023)  Social Connections: Moderately Isolated (03/02/2024)  Tobacco Use: Low Risk  (03/01/2024)  Health Literacy: Adequate Health Literacy (02/09/2024)     Readmission Risk Interventions    02/08/2024   11:10 AM  Readmission Risk Prevention Plan  Transportation Screening Complete  HRI or Home Care Consult Complete  Social Work Consult for Recovery Care Planning/Counseling Complete  Palliative Care Screening Not Applicable  Medication Review Oceanographer) Complete

## 2024-03-03 NOTE — Discharge Summary (Signed)
 Physician Discharge Summary  JAYQUON [name] [name] HQI:[id number] DOB: 1932/01/26 DOA: 03/01/2024  PCP: [name], MD  Admit date: 03/01/2024  Discharge date: 03/03/2024  Admitted From: Home  Disposition:  Home  Recommendations for Outpatient Follow-up:  Follow up with PCP in 1-2 weeks. Please obtain BMP/CBC in one week. Advised to follow-up with urology as scheduled. Advised to take ciprofloxacin 500 mg daily for 7 days. Advised to follow-up with urine culture / sensitivity.  Home Health:None Equipment/Devices:None  Discharge Condition: Stable CODE STATUS:Full code Diet recommendation: Heart Healthy   Brief [facility] Course: This 89 y.o. male with PMH significant for hypertension, GERD, arthritis, CKD stage IIIb, history of bladder cancer s/p cystectomy and prostatectomy on 02/08/2024 by Dr. Lolita Lenz.  He did have ureterostomy in place,  has been having normal urine output.  Right ureteral stent was removed yesterday on 02/29/24. Patient was feeling fine afterwards but starting today morning he started having pain in the right flank.He reports having subjective fevers and chills since associated with nausea but denies any vomiting. Patient not feeling well so he came in the ED. UA leukocytes large, ketones negative, nitrates negative, CT renal study: Status post cystoprostatectomy with right abdominal ureterostomy. There is mild right hydronephrosis and hydroureter, to the level of the urostomy. No obstructing stone. Patient was admitted for further evaluation and started on empiric antibiotics for complicated urinary tract infection. Urology was consulted. Patient felt much improved.  Right flank pain has resolved.  His renal functions has improved.  Urology signed off,  Patient can be discharged on antibiotics.  Urine culture growing gram-negative rods,  sensitivities pending.  Patient is being discharged on ciprofloxacin 500 mg daily for 7 days , antibiotics can be changed with  sensitivity coming back.  Patient will follow-up with urology,  Patient being discharged home.  Discharge Diagnoses:  Principal Problem:   Complicated UTI (urinary tract infection) Active Problems:   CKD (chronic kidney disease) stage 4, GFR 15-29 ml/min (HCC) - Baseline Scr 3.0   Malignant neoplasm of urinary bladder (HCC)   Essential hypertension   GERD (gastroesophageal reflux disease)   Hypothyroidism   Anemia of chronic disease   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   Ureterostomy status [facility])  Discharge Instructions: Advised to follow-up with urology as scheduled. Advised to take ciprofloxacin 500 mg daily for 7 days. Advised to follow-up with urine culture and sensitivity.  Discharge Instructions     Call MD for:  difficulty breathing, headache or visual disturbances   Complete by: As directed    Call MD for:  persistant dizziness or light-headedness   Complete by: As directed    Call MD for:  persistant nausea and vomiting   Complete by: As directed    Diet - low sodium heart healthy   Complete by: As directed    Diet Carb Modified   Complete by: As directed    Discharge instructions   Complete by: As directed    Advised to follow-up with primary care physician in 1 week. Advised to follow-up with urology Dr. Berneice Heinrich in 1 week. Advised to take Duricef twice a day for 5 days.   Increase activity slowly   Complete by: As directed       Allergies as of 03/03/2024       Reactions   Macrobid [nitrofurantoin] Shortness Of Breath   Ciprofloxacin Diarrhea, Other (See Comments)   Occurred with 750 mg dose. Has since taken 500 mg doses and has had no reaction/diarrhea.  Keytruda [pembrolizumab] Other (See Comments)   Weakness in both legs and fatigue   Tramadol Hcl Other (See Comments)   Pt unsure of reaction    Cephalexin Rash, Other (See Comments)   Rash on arms; tolerated Rocephin/cefpodoxime   [name] [mitomycin] Rash, Other (See Comments)   All over body  RASH; possibly triggered kidney REMOVAL and RESTLESS LEGS also        Medication List     STOP taking these medications    sodium bicarbonate 650 MG tablet       TAKE these medications    amLODipine 10 MG tablet Commonly known as: NORVASC Take 1 tablet (10 mg total) by mouth daily. What changed: when to take this   ciprofloxacin 500 MG tablet Commonly known as: Cipro Take 1 tablet (500 mg total) by mouth daily for 10 days.   ferrous sulfate 325 (65 FE) MG tablet Take 325 mg by mouth daily with supper.   finasteride 5 MG tablet Commonly known as: PROSCAR Take 5 mg by mouth every evening.   levothyroxine 50 MCG tablet Commonly known as: SYNTHROID Take 50 mcg by mouth daily before breakfast.   oxyCODONE 5 MG immediate release tablet Commonly known as: Roxicodone Take 1 tablet (5 mg total) by mouth every 6 (six) hours as needed for severe pain (pain score 7-10) (post-operatively).   pantoprazole 20 MG tablet Commonly known as: PROTONIX Take 20 mg by mouth at bedtime.   predniSONE 10 MG tablet Commonly known as: DELTASONE 20 mg daily until follow up What changed:  how much to take how to take this when to take this additional instructions   psyllium 58.6 % powder Commonly known as: METAMUCIL Take 0.5 packets by mouth every evening.   rosuvastatin 5 MG tablet Commonly known as: CRESTOR Take 5 mg by mouth at bedtime.   senna-docusate 8.6-50 MG tablet Commonly known as: Senokot-S Take 1 tablet by mouth 2 (two) times daily. While taking strong pain meds to prevent constipation   Tylenol 8 Hour Arthritis Pain 650 MG CR tablet Generic drug: acetaminophen Take 1,300 mg by mouth every 8 (eight) hours as needed for pain.        Follow-up Information     [name], MD Follow up in 1 week(s).   Specialty: Internal Medicine Contact information: [address] [place] [address] [phone]         [facility] Follow up in 1 week(s).   Contact information: [address] [place] [address] [phone]               Allergies  Allergen Reactions   Macrobid [Nitrofurantoin] Shortness Of Breath   Ciprofloxacin Diarrhea and Other (See Comments)    Occurred with 750 mg dose. Has since taken 500 mg doses and has had no reaction/diarrhea.   Keytruda [Pembrolizumab] Other (See Comments)    Weakness in both legs and fatigue   Tramadol Hcl Other (See Comments)    Pt unsure of reaction    Cephalexin Rash and Other (See Comments)    Rash on arms; tolerated Rocephin/cefpodoxime   [name] [Mitomycin] Rash and Other (See Comments)    All over body RASH; possibly triggered kidney REMOVAL and RESTLESS LEGS also    Consultations: Urology   Procedures/Studies: CT Renal Stone Study Result Date: 03/02/2024 CLINICAL DATA:  Flank pain recent bladder and prostate removal EXAM: CT ABDOMEN AND PELVIS WITHOUT CONTRAST TECHNIQUE: Multidetector CT imaging of the abdomen  and pelvis was performed following the standard protocol without IV contrast. RADIATION DOSE REDUCTION: This exam was performed according to the departmental dose-optimization program which includes automated exposure control, adjustment of the mA and/or kV according to patient size and/or use of iterative reconstruction technique. COMPARISON:  CT 09/28/2023 FINDINGS: Lower chest: Lung bases demonstrate no acute airspace disease. Hepatobiliary: No focal liver abnormality is seen. No gallstones, gallbladder wall thickening, or biliary dilatation. Pancreas: Unremarkable. No pancreatic ductal dilatation or surrounding inflammatory changes. Spleen: Normal in size without focal abnormality. Adrenals/Urinary Tract: Right adrenal gland is normal. Status post left nephrectomy. Interval right abdominal ureterostomy. There is mild right hydronephrosis and hydroureter, to the level of the urostomy. No obstructing stone. Status  post cystoprostatectomy. Stomach/Bowel: Stomach nonenlarged. There is no dilated small bowel. No acute bowel wall thickening. Diverticular disease of the sigmoid colon Vascular/Lymphatic: Aortic atherosclerosis. No enlarged abdominal or pelvic lymph nodes. Reproductive: Status post prostatectomy Other: No free air. Small volume free fluid in the pelvis. Moderate gas within the subcutaneous soft tissues likely related to recent surgery. Tubular fluid collection in the right anterior abdominal wall measuring 2.9 x 1.5 cm on series 9, image 65 probably postoperative. Left lower quadrant small fluid collection measuring 2.5 x 1.3 cm on series 2, image 43, no internal gas. Small supraumbilical ventral hernia containing fat and segment of small bowel but no obstruction. Small fat containing umbilical hernia. Small fat containing inguinal hernias. Small amount of gas within the right inguinal canal. Musculoskeletal: No acute or suspicious osseous abnormality IMPRESSION: 1. Status post cystoprostatectomy with right abdominal ureterostomy. There is mild right hydronephrosis and hydroureter, to the level of the urostomy. No obstructing stone. 2. Small volume free fluid in the pelvis. Moderate gas within the subcutaneous soft tissues likely related to recent surgery. Small fluid collections in the right anterior abdominal wall and left lower quadrant, nonspecific in appearance and could be related to recent surgery. 3. Small supraumbilical ventral hernia containing fat and segment of small bowel but no obstruction. 4. Diverticular disease of the sigmoid colon without acute inflammatory process. 5. Aortic atherosclerosis. Electronically Signed   By: Jasmine Pang M.D.   On: 03/02/2024 03:04   DG Chest 2 View Result Date: 03/01/2024 CLINICAL DATA:  Weakness. EXAM: CHEST - 2 VIEW COMPARISON:  03/16/2023 FINDINGS: The cardiomediastinal contours are normal. Aortic atherosclerosis. Subsegmental atelectasis at the left lung base.  No confluent airspace disease. Pulmonary vasculature is normal. No pleural effusion or pneumothorax. No acute osseous abnormalities are seen. IMPRESSION: Subsegmental atelectasis at the left lung base. Electronically Signed   By: Narda Rutherford M.D.   On: 03/01/2024 23:53   Subjective: Seen and examined at bedside.  Overnight events noted.   Patient reports doing much better, right flank pain has resolved, He denies any urinary symptoms.  Renal functions has improved.  Patient wants to be discharged.  Discharge Exam: Vitals:   03/03/24 0456 03/03/24 1141  BP: 117/70 (!) 111/58  Pulse: 73 73  Resp: 16 15  Temp: 98.3 F (36.8 C) 98 F (36.7 C)  SpO2: 97% 98%   Vitals:   03/02/24 1330 03/02/24 2046 03/03/24 0456 03/03/24 1141  BP: 130/68 112/64 117/70 (!) 111/58  Pulse: 86 79 73 73  Resp: 16 14 16 15   Temp: 98.4 F (36.9 C) 98.7 F (37.1 C) 98.3 F (36.8 C) 98 F (36.7 C)  TempSrc: Oral Oral Oral Oral  SpO2: 98% 97% 97% 98%  Weight:      Height:  General: Pt is alert, awake, not in acute distress Cardiovascular: RRR, S1/S2 +, no rubs, no gallops Respiratory: CTA bilaterally, no wheezing, no rhonchi Abdominal: Soft, NT, ND, bowel sounds + Extremities: no edema, no cyanosis    The results of significant diagnostics from this hospitalization (including imaging, microbiology, ancillary and laboratory) are listed below for reference.     Microbiology: Recent Results (from the past 240 hours)  Resp panel by RT-PCR (RSV, Flu A&B, Covid) Anterior Nasal Swab     Status: None   Collection Time: 03/01/24 10:48 PM   Specimen: Anterior Nasal Swab  Result Value Ref Range Status   SARS Coronavirus 2 by RT PCR NEGATIVE NEGATIVE Final    Comment: (NOTE) SARS-CoV-2 target nucleic acids are NOT DETECTED.  The SARS-CoV-2 RNA is generally detectable in upper respiratory specimens during the acute phase of infection. The lowest concentration of SARS-CoV-2 viral copies this  assay can detect is 138 copies/mL. A negative result does not preclude SARS-Cov-2 infection and should not be used as the sole basis for treatment or other patient management decisions. A negative result may occur with  improper specimen collection/handling, submission of specimen other than nasopharyngeal swab, presence of viral mutation(s) within the areas targeted by this assay, and inadequate number of viral copies(<138 copies/mL). A negative result must be combined with clinical observations, patient history, and epidemiological information. The expected result is Negative.  Fact Sheet for Patients:  BloggerCourse.com  Fact Sheet for Healthcare Providers:  SeriousBroker.it  This test is no t yet approved or cleared by the [place] FDA and  has been authorized for detection and/or diagnosis of SARS-CoV-2 by FDA under an Emergency Use Authorization (EUA). This EUA will remain  in effect (meaning this test can be used) for the duration of the COVID-19 declaration under Section 564(b)(1) of the Act, 21 U.S.C.section 360bbb-3(b)(1), unless the authorization is terminated  or revoked sooner.       Influenza A by PCR NEGATIVE NEGATIVE Final   Influenza B by PCR NEGATIVE NEGATIVE Final    Comment: (NOTE) The Xpert Xpress SARS-CoV-2/FLU/RSV plus assay is intended as an aid in the diagnosis of influenza from Nasopharyngeal swab specimens and should not be used as a sole basis for treatment. Nasal washings and aspirates are unacceptable for Xpert Xpress SARS-CoV-2/FLU/RSV testing.  Fact Sheet for Patients: BloggerCourse.com  Fact Sheet for Healthcare Providers: SeriousBroker.it  This test is not yet approved or cleared by the [place] FDA and has been authorized for detection and/or diagnosis of SARS-CoV-2 by FDA under an Emergency Use Authorization (EUA). This EUA will  remain in effect (meaning this test can be used) for the duration of the COVID-19 declaration under Section 564(b)(1) of the Act, 21 U.S.C. section 360bbb-3(b)(1), unless the authorization is terminated or revoked.     Resp Syncytial Virus by PCR NEGATIVE NEGATIVE Final    Comment: (NOTE) Fact Sheet for Patients: BloggerCourse.com  Fact Sheet for Healthcare Providers: SeriousBroker.it  This test is not yet approved or cleared by the [place] FDA and has been authorized for detection and/or diagnosis of SARS-CoV-2 by FDA under an Emergency Use Authorization (EUA). This EUA will remain in effect (meaning this test can be used) for the duration of the COVID-19 declaration under Section 564(b)(1) of the Act, 21 U.S.C. section 360bbb-3(b)(1), unless the authorization is terminated or revoked.  Performed at [facility], [address], [place], [place] [address]   Urine Culture     Status: Abnormal (Preliminary  result)   Collection Time: 03/01/24 11:09 PM   Specimen: Urine, Clean Catch  Result Value Ref Range Status   Specimen Description   Final    URINE, CLEAN CATCH Performed at [facility], [address], [place], [place] [address]    Special Requests   Final    NONE Performed at [facility], [address], [place], [place] [address]    Culture (A)  Final    >=100,000 COLONIES/mL GRAM NEGATIVE RODS IDENTIFICATION AND SUSCEPTIBILITIES TO FOLLOW CULTURE REINCUBATED FOR BETTER GROWTH Performed at [facility], [address] [place], [place] [address]    Report Status PENDING  Incomplete     Labs: BNP (last 3 results) No results for input(s): "BNP" in the last 8760 hours. Basic Metabolic Panel: Recent Labs  Lab 03/01/24 2120 03/03/24 0339  NA 140 137  K 4.2 4.1  CL 107 109  CO2 20* 19*  GLUCOSE 106* 131*  BUN 45* 47*  CREATININE 3.51* 3.05*   CALCIUM 9.1 8.3*  MG  --  1.4*  PHOS  --  3.9   Liver Function Tests: Recent Labs  Lab 03/03/24 0339  AST 14*  ALT 13  ALKPHOS 43  BILITOT 0.7  PROT 5.2*  ALBUMIN 2.7*   No results for input(s): "LIPASE", "AMYLASE" in the last 168 hours. No results for input(s): "AMMONIA" in the last 168 hours. CBC: Recent Labs  Lab 03/01/24 2120 03/03/24 0339  WBC 12.7* 7.9  HGB 11.4* 8.8*  HCT 36.5* 28.7*  MCV 98.1 101.1*  PLT 241 158   Cardiac Enzymes: No results for input(s): "CKTOTAL", "CKMB", "CKMBINDEX", "TROPONINI" in the last 168 hours. BNP: Invalid input(s): "POCBNP" CBG: Recent Labs  Lab 03/01/24 2113  GLUCAP 91   D-Dimer No results for input(s): "DDIMER" in the last 72 hours. Hgb A1c No results for input(s): "HGBA1C" in the last 72 hours. Lipid Profile No results for input(s): "CHOL", "HDL", "LDLCALC", "TRIG", "CHOLHDL", "LDLDIRECT" in the last 72 hours. Thyroid function studies No results for input(s): "TSH", "T4TOTAL", "T3FREE", "THYROIDAB" in the last 72 hours.  Invalid input(s): "FREET3" Anemia work up No results for input(s): "VITAMINB12", "FOLATE", "FERRITIN", "TIBC", "IRON", "RETICCTPCT" in the last 72 hours. Urinalysis    Component Value Date/Time   COLORURINE YELLOW 03/01/2024 2309   APPEARANCEUR HAZY (A) 03/01/2024 2309   LABSPEC 1.009 03/01/2024 2309   PHURINE 6.0 03/01/2024 2309   GLUCOSEU NEGATIVE 03/01/2024 2309   HGBUR MODERATE (A) 03/01/2024 2309   BILIRUBINUR NEGATIVE 03/01/2024 2309   KETONESUR NEGATIVE 03/01/2024 2309   PROTEINUR 100 (A) 03/01/2024 2309   NITRITE NEGATIVE 03/01/2024 2309   LEUKOCYTESUR LARGE (A) 03/01/2024 2309   Sepsis Labs Recent Labs  Lab 03/01/24 2120 03/03/24 0339  WBC 12.7* 7.9   Microbiology Recent Results (from the past 240 hours)  Resp panel by RT-PCR (RSV, Flu A&B, Covid) Anterior Nasal Swab     Status: None   Collection Time: 03/01/24 10:48 PM   Specimen: Anterior Nasal Swab  Result Value Ref  Range Status   SARS Coronavirus 2 by RT PCR NEGATIVE NEGATIVE Final    Comment: (NOTE) SARS-CoV-2 target nucleic acids are NOT DETECTED.  The SARS-CoV-2 RNA is generally detectable in upper respiratory specimens during the acute phase of infection. The lowest concentration of SARS-CoV-2 viral copies this assay can detect is 138 copies/mL. A negative result does not preclude SARS-Cov-2 infection and should not be used as the sole basis for treatment or other  patient management decisions. A negative result may occur with  improper specimen collection/handling, submission of specimen other than nasopharyngeal swab, presence of viral mutation(s) within the areas targeted by this assay, and inadequate number of viral copies(<138 copies/mL). A negative result must be combined with clinical observations, patient history, and epidemiological information. The expected result is Negative.  Fact Sheet for Patients:  BloggerCourse.com  Fact Sheet for Healthcare Providers:  SeriousBroker.it  This test is no t yet approved or cleared by the [place] FDA and  has been authorized for detection and/or diagnosis of SARS-CoV-2 by FDA under an Emergency Use Authorization (EUA). This EUA will remain  in effect (meaning this test can be used) for the duration of the COVID-19 declaration under Section 564(b)(1) of the Act, 21 U.S.C.section 360bbb-3(b)(1), unless the authorization is terminated  or revoked sooner.       Influenza A by PCR NEGATIVE NEGATIVE Final   Influenza B by PCR NEGATIVE NEGATIVE Final    Comment: (NOTE) The Xpert Xpress SARS-CoV-2/FLU/RSV plus assay is intended as an aid in the diagnosis of influenza from Nasopharyngeal swab specimens and should not be used as a sole basis for treatment. Nasal washings and aspirates are unacceptable for Xpert Xpress SARS-CoV-2/FLU/RSV testing.  Fact Sheet for  Patients: BloggerCourse.com  Fact Sheet for Healthcare Providers: SeriousBroker.it  This test is not yet approved or cleared by the [place] FDA and has been authorized for detection and/or diagnosis of SARS-CoV-2 by FDA under an Emergency Use Authorization (EUA). This EUA will remain in effect (meaning this test can be used) for the duration of the COVID-19 declaration under Section 564(b)(1) of the Act, 21 U.S.C. section 360bbb-3(b)(1), unless the authorization is terminated or revoked.     Resp Syncytial Virus by PCR NEGATIVE NEGATIVE Final    Comment: (NOTE) Fact Sheet for Patients: BloggerCourse.com  Fact Sheet for Healthcare Providers: SeriousBroker.it  This test is not yet approved or cleared by the [place] FDA and has been authorized for detection and/or diagnosis of SARS-CoV-2 by FDA under an Emergency Use Authorization (EUA). This EUA will remain in effect (meaning this test can be used) for the duration of the COVID-19 declaration under Section 564(b)(1) of the Act, 21 U.S.C. section 360bbb-3(b)(1), unless the authorization is terminated or revoked.  Performed at [facility], [address], [place], [place] [address]   Urine Culture     Status: Abnormal (Preliminary result)   Collection Time: 03/01/24 11:09 PM   Specimen: Urine, Clean Catch  Result Value Ref Range Status   Specimen Description   Final    URINE, CLEAN CATCH Performed at [facility], [address], [place], [place] [address]    Special Requests   Final    NONE Performed at [facility], [address], [place], [place] [address]    Culture (A)  Final    >=100,000 COLONIES/mL GRAM NEGATIVE RODS IDENTIFICATION AND SUSCEPTIBILITIES TO FOLLOW CULTURE REINCUBATED FOR BETTER GROWTH Performed at [facility], [address] [place], [place] [address]    Report Status PENDING  Incomplete     Time coordinating discharge: Over 30 minutes  SIGNED:   Willeen Niece, MD  [facility] 03/03/2024, 1:43 PM Pager   If 7PM-7AM, please contact night-coverage

## 2024-03-04 NOTE — Progress Notes (Signed)
     Consult received. Chart reviewed. Discussed with hospitalist Dr. Elsworth Halt.   Patient is likely discharging home tomorrow. Recommend referral to outpatient palliative.    Maisie Scotland, NP-C Palliative Medicine   Please call Palliative Medicine team phone with any questions [phone]. For individual providers please see AMION.   No charge

## 2024-03-05 LAB — URINE CULTURE: Culture: >=100000 — AB

## 2024-03-06 ENCOUNTER — Other Ambulatory Visit: Payer: Self-pay | Admitting: *Deleted

## 2024-03-06 ENCOUNTER — Inpatient Hospital Stay: Payer: Medicare Other

## 2024-03-06 ENCOUNTER — Telehealth: Payer: Self-pay

## 2024-03-06 ENCOUNTER — Other Ambulatory Visit: Payer: Self-pay

## 2024-03-06 VITALS — BP 141/73 | HR 89 | Temp 97.9°F | Resp 17 | Wt 183.1 lb

## 2024-03-06 DIAGNOSIS — E039 Hypothyroidism, unspecified: Secondary | ICD-10-CM

## 2024-03-06 DIAGNOSIS — C61 Malignant neoplasm of prostate: Secondary | ICD-10-CM

## 2024-03-06 DIAGNOSIS — N179 Acute kidney failure, unspecified: Secondary | ICD-10-CM | POA: Diagnosis not present

## 2024-03-06 DIAGNOSIS — C679 Malignant neoplasm of bladder, unspecified: Secondary | ICD-10-CM

## 2024-03-06 DIAGNOSIS — D649 Anemia, unspecified: Secondary | ICD-10-CM | POA: Insufficient documentation

## 2024-03-06 DIAGNOSIS — C678 Malignant neoplasm of overlapping sites of bladder: Secondary | ICD-10-CM | POA: Insufficient documentation

## 2024-03-06 DIAGNOSIS — N390 Urinary tract infection, site not specified: Secondary | ICD-10-CM

## 2024-03-06 LAB — CBC WITH DIFFERENTIAL (CANCER CENTER ONLY)
Abs Immature Granulocytes: 0.1 10*3/uL — ABNORMAL HIGH (ref 0.00–0.07)
Basophils Absolute: 0 10*3/uL (ref 0.0–0.1)
Basophils Relative: 0 %
Eosinophils Absolute: 0.2 10*3/uL (ref 0.0–0.5)
Eosinophils Relative: 2 %
HCT: 31.6 % — ABNORMAL LOW (ref 39.0–52.0)
Hemoglobin: 10.3 g/dL — ABNORMAL LOW (ref 13.0–17.0)
Immature Granulocytes: 1 %
Lymphocytes Relative: 6 %
Lymphs Abs: 0.6 10*3/uL — ABNORMAL LOW (ref 0.7–4.0)
MCH: 30.4 pg (ref 26.0–34.0)
MCHC: 32.6 g/dL (ref 30.0–36.0)
MCV: 93.2 fL (ref 80.0–100.0)
Monocytes Absolute: 0.5 10*3/uL (ref 0.1–1.0)
Monocytes Relative: 5 %
Neutro Abs: 8.8 10*3/uL — ABNORMAL HIGH (ref 1.7–7.7)
Neutrophils Relative %: 86 %
Platelet Count: 207 10*3/uL (ref 150–400)
RBC: 3.39 MIL/uL — ABNORMAL LOW (ref 4.22–5.81)
RDW: 16 % — ABNORMAL HIGH (ref 11.5–15.5)
WBC Count: 10.2 10*3/uL (ref 4.0–10.5)
nRBC: 0 % (ref 0.0–0.2)

## 2024-03-06 LAB — CMP (CANCER CENTER ONLY)
ALT: 16 U/L (ref 0–44)
AST: 15 U/L (ref 15–41)
Albumin: 4.3 g/dL (ref 3.5–5.0)
Alkaline Phosphatase: 56 U/L (ref 38–126)
Anion gap: 12 (ref 5–15)
BUN: 52 mg/dL — ABNORMAL HIGH (ref 8–23)
CO2: 19 mmol/L — ABNORMAL LOW (ref 22–32)
Calcium: 9.3 mg/dL (ref 8.9–10.3)
Chloride: 109 mmol/L (ref 98–111)
Creatinine: 4.02 mg/dL — ABNORMAL HIGH (ref 0.61–1.24)
GFR, Estimated: 14 mL/min — ABNORMAL LOW (ref >60)
Glucose, Bld: 90 mg/dL (ref 70–99)
Potassium: 4.1 mmol/L (ref 3.5–5.1)
Sodium: 140 mmol/L (ref 135–145)
Total Bilirubin: 0.6 mg/dL (ref 0.0–1.2)
Total Protein: 7 g/dL (ref 6.5–8.1)

## 2024-03-06 LAB — VITAMIN B12: Vitamin B-12: 564 pg/mL (ref 180–914)

## 2024-03-06 LAB — FERRITIN: Ferritin: 95 ng/mL (ref 24–336)

## 2024-03-06 LAB — T4, FREE: Free T4: 0.99 ng/dL (ref 0.61–1.12)

## 2024-03-06 LAB — TSH: TSH: 4.176 u[IU]/mL (ref 0.350–4.500)

## 2024-03-06 NOTE — Transitions of Care (Post Inpatient/ED Visit) (Signed)
   03/06/2024  Name: [name] [name] MRN: [id number] DOB: [birth year]  Today's TOC FU Call Status: Today's TOC FU Call Status:: Unsuccessful Call (1st Attempt) Unsuccessful Call (1st Attempt) Date: 03/06/24  Attempted to reach the patient regarding the most recent Inpatient/ED visit. Patient was called in an Outreach attempt to offer VBCI  30-day TOC program. Pt is eligible for program due to potential risk for readmission and/or high utilization. Unfortunately, I was not able to speak with the patient in regards to recent hospital discharge. I was unable to leave a message , number called back and disconnected call when answered      Follow Up Plan: Additional outreach attempts will be made to reach the patient to complete the Transitions of Care (Post Inpatient/ED visit) call.   James Mcardle , BSN, RN [facility]   VBCI-Population Health RN Care Manager Direct Dial [phone]  Fax: [phone] Website: [email or web address]

## 2024-03-06 NOTE — Assessment & Plan Note (Addendum)
 Will continue prednisone and decrease to 10 mg every other daily for 2 weeks Recommend follow up with nephrology after surgery for evaluation Continue fluid to 60-70 oz per day

## 2024-03-06 NOTE — Progress Notes (Signed)
 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    Patient received 1 dose of pembrolizumab and developed elevated creatinine up to 4.07. Baseline mostly below 3.4. Treatment stopped. Repeat UA without signs of infectious process.  AKI on CKD improved on prednisone.   He now has now had cystoprostatectomy and final pathology showed T1N0 UC. Will wean off steroids. Started 10 mg every other day for 2 weeks, then stop.  Assessment & Plan Malignant neoplasm of urinary bladder, unspecified site [facility]) Will not need additional therapy for resected T1N0 disease. Continue follow up with Urology Prostatic adenocarcinoma [facility]) Will follow up with Urology for surveillance in the future AKI (acute kidney injury) (HCC) Will continue prednisone and decrease to 10 mg every other daily for 2 weeks Recommend follow up with nephrology after surgery for evaluation Continue fluid to 60-70 oz per day Complicated UTI (urinary tract infection) Currently on Cipro.  Continue cipro as prescribed. Increase fluid Normocytic anemia Will check b12 and ferritin  Orders Placed This Encounter  Procedures   Basic Metabolic Panel - Cancer Center Only    Standing Status:   Future    Expiration Date:   03/06/2025     [name] Sartorius, MD  INTERVAL HISTORY: Patient returns for follow-up.  Oncology History Overview Note  2000 BPH with elevated PSA. Report biopsy was benign.   2011 to now with multiple TURBT   2021 TaG3 bladder cancer. TURBT + BCG x 6  2023 TaG1 bladder + left renal pelvis cancer, failed [name] >>  08/2022 Left nephroureterectomy +  RPLND. 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  06/2023 cysto w/ TURBT multifocal papillary bladder tumor recurrence (dome, left trigone, bladder neck, prostate urethral tissue) ==> TaG3 with negative muscle  Path: A. BLADDER TUMOR, TURBT:  Noninvasive high grade papillary urothelial carcinoma with inverted  growth pattern  muscularis propria (detrusor muscle) is present and not involved by  carcinoma   B. BLADDER TUMOR, NECK, BIOPSY:  Noninvasive high-grade papillary urothelial carcinoma with inverted  growth pattern  Submucosa, prostatic glands and muscularis is present and not involved  by carcinoma   C. BLADDER TUMOR, BASE, BIOPSY:  Noninvasive high grade papillary urothelial carcinoma  Muscularis propria is present and not involved by carcinoma       Malignant neoplasm of urinary bladder (HCC)  09/15/2019 Initial Diagnosis   Malignant neoplasm of urinary bladder (HCC)   08/05/2023 Cancer Staging   Staging form: Urinary Bladder, AJCC 8th Edition - Clinical: Stage 0a (rcTa, cN0, cM0) - Signed by [name] Sartorius, MD on 08/05/2023 Stage prefix: Recurrence WHO/ISUP grade (low/high): High Grade Histologic grading system: 2 grade system   09/16/2023 - 09/16/2023 Chemotherapy   Patient is on Treatment Plan : BLADDER Pembrolizumab (200) q21d     10/01/2023 Pathology Results   Presented with hematuria and underwent TURBT  A. BLADDER TUMOR, TURBT:  Noninvasive high grade papillary urothelial carcinoma  Muscularis propria (detrusor muscle) is not present    02/04/2024 Pathology Results   pT1 N0 0.9 cm High grade  papillary urothelial carcinoma involving left residual ureter. The carcinoma invades subepithelial connective tissue (pT1)  4 negative lymph nodes for malignancy All margins negative   03/03/2024 Cancer Staging   Staging form: Urinary Bladder, AJCC 8th Edition - Pathologic: Stage I (pT1, pN0, cM0) - Signed by Lowanda Ruddy, MD on 03/03/2024 WHO/ISUP grade (low/high): High Grade Histologic grading system: 2 grade system Residual tumor (R): R0   Prostatic adenocarcinoma (HCC)  02/04/2024 Initial Diagnosis   Prostatic adenocarcinoma (HCC) Found from cystoprostatectomy from bladder cancer. pT3a +EPE. Margins negative. GS 4+3=7 (grade group 3)    03/03/2024 Cancer Staging   Staging form: Prostate, AJCC 8th Edition - Clinical: Stage IIIB (cT3a, cN0, cM0, Grade Group: 3) - Signed by Lowanda Ruddy, MD on 03/03/2024 Gleason score: 7 Histologic grading system: 5 grade system      PHYSICAL EXAMINATION: ECOG PERFORMANCE STATUS: 1 - Symptomatic but completely ambulatory  Vitals:   03/06/24 0934 03/06/24 0945  BP: (!) 148/75 (!) 141/73  Pulse: 89   Resp: 17   Temp: 97.9 F (36.6 C)   SpO2: 100%    Filed Weights   03/06/24 0934  Weight: 183 lb 1.6 oz (83.1 kg)    GENERAL: alert, no distress and comfortable SKIN: skin color normal  GU:  clear urine from ureterostomy   Relevant data reviewed during this visit included labs

## 2024-03-06 NOTE — Assessment & Plan Note (Signed)
 Will follow up with Urology for surveillance in the future

## 2024-03-06 NOTE — Assessment & Plan Note (Addendum)
 Will not need additional therapy for resected T1N0 disease. Continue follow up with Urology

## 2024-03-06 NOTE — Assessment & Plan Note (Addendum)
 Currently on Cipro.  Continue cipro as prescribed. Increase fluid

## 2024-03-07 ENCOUNTER — Telehealth: Payer: Self-pay

## 2024-03-07 NOTE — Transitions of Care (Post Inpatient/ED Visit) (Signed)
   03/07/2024  Name: [name] [name] MRN: [id number] DOB: August 27, 1932  Today's TOC FU Call Status: Today's TOC FU Call Status:: Unsuccessful Call (2nd Attempt) Unsuccessful Call (1st Attempt) Date: 03/06/24 Unsuccessful Call (2nd Attempt) Date: 03/07/24  Attempted to reach the patient regarding the most recent Inpatient/ED visit. Patient was called in an Outreach attempt to offer VBCI  30-day TOC program. Pt is eligible for program due to potential risk for readmission and/or high utilization. Unfortunately, I was not able to speak with the patient in regards to recent hospital discharge   Left a HIPAA compliant phone message for patient including VBCI CM contact information with request for a call back in regard to recent hospital discharge    Follow Up Plan: Additional outreach attempts will be made to reach the patient to complete the Transitions of Care (Post Inpatient/ED visit) call.   James Mcardle , BSN, RN [facility]   VBCI-Population Health RN Care Manager Direct Dial [phone]  Fax: [phone] Website: [email or web address]

## 2024-03-08 ENCOUNTER — Telehealth: Payer: Self-pay

## 2024-03-08 NOTE — Transitions of Care (Post Inpatient/ED Visit) (Signed)
   03/08/2024  Name: [name] [name] MRN: [id number] DOB: Jun 15, 1932  Today's TOC FU Call Status:    Attempted to reach the patient regarding the most recent Inpatient/ED visit.  Follow Up Plan: No further outreach attempts will be made at this time. We have been unable to contact the patient.  Natha Guin J. Bethanie Bloxom RN, MSN [facility], [facility] RN Care Manager Direct Dial: [phone]  Fax: [phone] Website: [email or web address]

## 2024-03-09 ENCOUNTER — Ambulatory Visit: Admitting: Physical Therapy

## 2024-03-09 ENCOUNTER — Encounter: Payer: Self-pay | Admitting: Physical Therapy

## 2024-03-09 ENCOUNTER — Encounter: Admitting: Physical Therapy

## 2024-03-09 DIAGNOSIS — M6281 Muscle weakness (generalized): Secondary | ICD-10-CM | POA: Diagnosis not present

## 2024-03-09 DIAGNOSIS — R2689 Other abnormalities of gait and mobility: Secondary | ICD-10-CM | POA: Diagnosis not present

## 2024-03-09 DIAGNOSIS — M25551 Pain in right hip: Secondary | ICD-10-CM | POA: Diagnosis not present

## 2024-03-09 NOTE — Therapy (Signed)
 OUTPATIENT PHYSICAL THERAPY LOWER EXTREMITY TREATMENT   Patient Name: [name] [name] MRN: [id number] DOB:04/05/1932, 89 y.o., male Today's Date: 03/09/2024  END OF SESSION:  PT End of Session - 03/09/24 1336     Visit Number 2    Number of Visits 16    Authorization Type [employer] medicare    PT Start Time 1342    PT Stop Time 1423    PT Time Calculation (min) 41 min    Activity Tolerance Patient tolerated treatment well    Behavior During Therapy WFL for tasks assessed/performed             Past Medical History:  Diagnosis Date   Anemia    Arthritis    lower back bulging disc, hips, knees, thumbs, shoulder   Asthma    as a pre teen   Bladder cancer (HCC) UROLOGIST-  DR MCKENZIE   S/P TURBT 08-16-2017   Bladder wall hemorrhage 09/28/2023   BPH (benign prostatic hyperplasia)    CKD (chronic kidney disease), stage III (HCC)    STAGE 3  B CKD per lov 10-16-2020 dr Cathalene Clipper Seretha Dance kidney on chart   Dyspnea    Fatigue    MIDDLE OF DAY ON OCCASION   Frequency of urination    GERD (gastroesophageal reflux disease)    on protonix   Hematuria    Hematuria    History of colon polyps    HLD (hyperlipidemia)    HOH (hard of hearing)    slightly hoh right worse thanleft   HTN (hypertension)    Hypothyroidism    Incontinence of urine    weras pads all the time   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   Right Buccal Cheek   Pre-diabetes    Recurrent bladder papillary carcinoma (HCC) hx 2012 and 2014-- urologist- dr Claretta Croft   s/p  TURBT 08-16-2017  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   Renal mass 09/02/2022   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 10/02/2023   Sleep apnea    uses cpap set on 20 /4   Trigger finger of left hand 07/04/2021   last few months per pt   UTI (urinary tract infection) 08/20/2020   Weakness of extremity    left knee   Wears glasses    Past Surgical History:  Procedure Laterality Date    BCG X 2     AFTER LAST 2 BLADDER TUMOR REMOVALS   CATARACT EXTRACTION, BILATERAL     COLONSCOPY  YRS AGO   CYSTOSCOPY N/A 10/29/2020   Procedure: CYSTOSCOPY;  Surgeon: Roxane Copp, MD;  Location: [facility];  Service: Urology;  Laterality: N/A;   CYSTOSCOPY N/A 07/08/2021   Procedure: CYSTOSCOPY;  Surgeon: Roxane Copp, MD;  Location: [facility];  Service: Urology;  Laterality: N/A;   CYSTOSCOPY W/ RETROGRADES Right 07/21/2023   Procedure: CYSTOSCOPY WITH RIGHT RETROGRADE PYELOGRAM;  Surgeon: Melody Spurling., MD;  Location: [facility];  Service: Urology;  Laterality: Right;   CYSTOSCOPY W/ URETERAL STENT PLACEMENT Bilateral 08/16/2017   Procedure: CYSTOSCOPY WITH RETROGRADE PYELOGRAM/URETERAL STENT PLACEMENT;  Surgeon: Marco Severs, MD;  Location[facility];  Service: Urology;  Laterality: Bilateral;   CYSTOSCOPY W/ URETERAL STENT PLACEMENT Left 09/02/2022   Procedure: CYSTOSCOPY WITH RETROGRADE PYELOGRAM, URETERAL STENT PLACEMENT;  Surgeon: Osborn Blaze, MD;  Location: [facility];  Service: Urology;  Laterality: Left;   CYSTOSCOPY WITH BIOPSY N/A 06/25/2020   Procedure:  CYSTOSCOPY FULGURATION   BLADDER  BIOPSY;  Surgeon: Roxane Copp, MD;  Location: [facility];  Service: Urology;  Laterality: N/A;   CYSTOSCOPY WITH FULGERATION N/A 12/08/2019   Procedure: CYSTOSCOPY WITH FULGERATION;  Surgeon: Marco Severs, MD;  Location: [facility];  Service: Urology;  Laterality: N/A;   CYSTOSCOPY WITH FULGERATION N/A 03/24/2022   Procedure: CYSTOSCOPY WITH FULGERATION;  Surgeon: Roxane Copp, MD;  Location: [facility];  Service: Urology;  Laterality: N/A;   CYSTOSCOPY WITH RETROGRADE PYELOGRAM, URETEROSCOPY AND STENT PLACEMENT Left 03/24/2022   Procedure: CYSTOSCOPY WITH RETROGRADE PYELOGRAM, DIAGNOSTIC URETEROSCOPY WITH BIOPSY AND STENT PLACEMENT;  Surgeon: Roxane Copp, MD;  Location: [facility];   Service: Urology;  Laterality: Left;  90 MINUTES   CYSTOSCOPY WITH RETROGRADE PYELOGRAM, URETEROSCOPY AND STENT PLACEMENT Left 04/07/2022   Procedure: CYSTOSCOPY WITH  RETROGRADE PYELOGRAM, DIAGNOSTIC URETEROSCOPY WITH BIOPSY AND STENT EXCHANGE;  Surgeon: Roxane Copp, MD;  Location: [facility];  Service: Urology;  Laterality: Left;  75 MINUTES   CYSTOSCOPY WITH RETROGRADE PYELOGRAM, URETEROSCOPY AND STENT PLACEMENT Left 08/11/2022   Procedure: CYSTOSCOPY WITH RETROGRADE PYELOGRAM, ANTEGRADE NEPHROSTOGRAM, DIAGNOSTIC  URETEROSCOPY;  Surgeon: Roxane Copp, MD;  Location: [facility];  Service: Urology;  Laterality: Left;  90 MINS   HOLMIUM LASER APPLICATION Left 08/11/2022   Procedure: HOLMIUM LASER APPLICATION;  Surgeon: Roxane Copp, MD;  Location: [facility];  Service: Urology;  Laterality: Left;   IR NEPHROSTOMY EXCHANGE LEFT  05/15/2022   IR NEPHROSTOMY EXCHANGE LEFT  07/10/2022   IR NEPHROSTOMY PLACEMENT LEFT  05/06/2022   KNEE ARTHROSCOPY Left 06/10/2011   LYMPH NODE DISSECTION Left 09/02/2022   Procedure: RETROPERITONEAL LYMPH NODE DISSECTION;  Surgeon: Osborn Blaze, MD;  Location: [facility];  Service: Urology;  Laterality: Left;   PROSTATE BIOPSY  2000   ROBOT ASSITED LAPAROSCOPIC NEPHROURETERECTOMY Left 09/02/2022   Procedure: XI ROBOT ASSITED LAPAROSCOPIC NEPHROURETERECTOMY;  Surgeon: Osborn Blaze, MD;  Location: [facility];  Service: Urology;  Laterality: Left;   ROBOT LAP RADICAL CYSTOPROSTATECTOMY PELVIC LYMPHADENECTOMY, NEOBLADDER N/A 02/04/2024   Procedure: ROBOT ASSISTED LAPAROSCOPIC RADICAL CYSTOPROSTATECTOMY;  Surgeon: Melody Spurling., MD;  Location: [facility];  Service: Urology;  Laterality: N/A;  300 MINUTES NEEDED FOR CASE   TRANSURETHRAL RESECTION OF BLADDER TUMOR N/A 08/16/2017   Procedure: TRANSURETHRAL RESECTION OF BLADDER TUMOR (TURBT);  Surgeon: Marco Severs, MD;  Location: [facility];  Service: Urology;  Laterality: N/A;   TRANSURETHRAL RESECTION OF  BLADDER TUMOR  12-09-2012   dr Lavona Pounds [facility]   and TURP   TRANSURETHRAL RESECTION OF BLADDER TUMOR N/A 09/20/2017   Procedure: TRANSURETHRAL RESECTION OF BLADDER TUMOR (TURBT);  Surgeon: Marco Severs, MD;  Location: [facility];  Service: Urology;  Laterality: N/A;   TRANSURETHRAL RESECTION OF BLADDER TUMOR N/A 12/08/2019   Procedure: TRANSURETHRAL RESECTION OF BLADDER TUMOR;  Surgeon: Marco Severs, MD;  Location: [facility];  Service: Urology;  Laterality: N/A;  1 HR   TRANSURETHRAL RESECTION OF BLADDER TUMOR N/A 10/29/2020   Procedure: BLADDER FULGERATION;  Surgeon: Roxane Copp, MD;  Location: [facility];  Service: Urology;  Laterality: N/A;  30 MINS   TRANSURETHRAL RESECTION OF BLADDER TUMOR N/A 07/08/2021   Procedure: TRANSURETHRAL RESECTION OF BLADDER TUMOR (TURBT)/FULGERATION;  Surgeon: Roxane Copp, MD;  Location: [facility];  Service: Urology;  Laterality: N/A;  45 MINS   TRANSURETHRAL RESECTION OF BLADDER TUMOR N/A 12/02/2021  Procedure: TRANSURETHRAL RESECTION OF BLADDER TUMOR (TURBT);  Surgeon: Roxane Copp, MD;  Location: [facility];  Service: Urology;  Laterality: N/A;   TRANSURETHRAL RESECTION OF BLADDER TUMOR N/A 07/21/2023   Procedure: TRANSURETHRAL RESECTION OF BLADDER TUMOR (TURBT);  Surgeon: Melody Spurling., MD;  Location: [facility];  Service: Urology;  Laterality: N/A;  60 MINUTES   TRANSURETHRAL RESECTION OF BLADDER TUMOR N/A 10/01/2023   Procedure: TRANSURETHRAL RESECTION OF BLADDER TUMOR (TURBT)/CLOT EVACUATION WITH FULGERATION;  Surgeon: Melody Spurling., MD;  Location: [facility];  Service: Urology;  Laterality: N/A;   TRANSURETHRAL RESECTION OF PROSTATE  2011   UPPER GI ENDOSCOPY  YRS AGO   URETEROSCOPY Right 02/04/2024   Procedure: RIGHT CUTANEOUS URETEROSCOPY;  Surgeon: Melody Spurling., MD;  Location: [facility];  Service: Urology;  Laterality: Right;   Patient Active  Problem List   Diagnosis Date Noted   Prostatic adenocarcinoma (HCC) 03/03/2024   UTI (urinary tract infection) 03/02/2024   Complicated UTI (urinary tract infection) 03/02/2024   Ureterostomy status (HCC) 02/15/2024   History of iron deficiency 12/20/2023   Dysuria 10/15/2023   AKI (acute kidney injury) (HCC) 10/08/2023   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 10/02/2023   Decreased appetite 09/28/2023   Hardening of the aorta (main artery of the heart) (HCC) 09/28/2023   Mixed hyperlipidemia 09/28/2023   Prediabetes 09/28/2023   Panic attack 09/28/2023   Pedal edema 09/28/2023   Osteoarthritis 09/28/2023   Sinusitis chronic, sphenoidal 09/28/2023   Acute blood loss anemia (ABLA) 09/28/2023   Hypothyroidism 09/28/2023   Low serum vitamin B12 09/10/2023   Anemia of chronic disease 09/10/2023   Normocytic anemia 08/06/2023   Unintentional weight loss 04/09/2021   Leg weakness, bilateral 04/09/2021   Surgical counseling visit 04/09/2021   Essential hypertension 05/23/2020   Sleep apnea treated with nocturnal BiPAP 01/25/2020   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   Hypersomnia with sleep apnea 09/15/2019   Cardiac arrhythmia due to premature depolarization 09/15/2019   Excessive daytime sleepiness 09/15/2019   CKD (chronic kidney disease) stage 4, GFR 15-29 ml/min (HCC) - Baseline Scr 3.0 09/15/2019   Malignant neoplasm of urinary bladder (HCC) 09/15/2019   Gross hematuria 09/11/2017   Benign prostatic hyperplasia without lower urinary tract symptoms 12/09/2012   GERD (gastroesophageal reflux disease) 11/28/2012     PCP: Virgle Grime  REFERRING PROVIDER: Virgle Grime  REFERRING DIAG: Leg weakness   THERAPY DIAG:  Pain in right hip  Muscle weakness (generalized)  Other abnormalities of gait and mobility  Rationale for Evaluation and Treatment: Rehabilitation  ONSET DATE:    SUBJECTIVE:   SUBJECTIVE STATEMENT: Recent  hospitalization 4/9-4/11 due to UTI, he reports feeling better. Still taking meds. Did walk today, feels hip was very weak/limping after doing down 1 block.    Eval:  Pt states weakness in legs R>L ongoing for a while. He had significant weakness previously from medication issues but has slightly improved. He states pain in his R hip, sharp pains with twisting at times, and instability feeling with walking and activity due to hip pain.He has had previous L knee surgery, states R hip OA, and has had recent surgery for cancer, with bladder and prostate removal. Since then he states feeling pretty good, he does have swelling in feet and legs, just saw Dr this week who was not concerned. He is wearing sandals today, with obvious swelling in feet and legs.  He used to walk about 2 mi/day in past  years, recently he did walk about 1.5 miles States minimal fatigue with endurance, but more limitation from R hip and leg weakness.  He is currently living with son and his wife in split level house. States doing ok with stairs for now. Has walking stick, but not using today, does usually use most of the time.    PERTINENT HISTORY: Recent bladder and prostate removal for CA, CKD,  Nephrectomy (L),    PAIN:  Are you having pain? Yes: NPRS scale: 5/10    Pain location: R hip/anterior/lateral Pain description: sore, stiff, shooting with twisting motion Aggravating factors: increased activity , initial standing  Relieving factors: none stated   PRECAUTIONS: None  WEIGHT BEARING RESTRICTIONS: No  FALLS:  Has patient fallen in last 6 months? No  PLOF: Independent  PATIENT GOALS:  Decreased pain in hip, improve walking and balance.   NEXT MD VISIT:   OBJECTIVE:   DIAGNOSTIC FINDINGS:   PATIENT SURVEYS:    COGNITION: Overall cognitive status: Within functional limits for tasks assessed     SENSATION: WFL  EDEMA: mod/significant edema in bil lower legs and feet.    POSTURE:    No Significant  postural limitations  PALPATION:   mild tenderness at R anterior hip/along groin on R, mild tenderness in lateral hip/gr troch.  Hypomobile R hip   LOWER EXTREMITY ROM: Hips:  L: mild limitation for flex, ER, mod for IR,   R:  mod limitation for all motions.   LOWER EXTREMITY MMT:  MMT Left eval Right  eval  Hip flexion 4- 3-  Hip extension    Hip abduction    Hip adduction    Hip internal rotation    Hip external rotation    Knee flexion 4 4  Knee extension 4+ 4+  Ankle dorsiflexion    Ankle plantarflexion    Ankle inversion    Ankle eversion     (Blank rows = not tested)  LOWER EXTREMITY SPECIAL TESTS:    FUNCTIONAL TESTS:    GAIT: Distance walked: 165ft Assistive device utilized: None Level of assistance: Complete Independence Comments: antalgic gait for R hip, decreased hip extension on R. , decreased stability   TODAY'S TREATMENT:                                                                                                                              DATE:   03/09/2024 Therapeutic Exercise: Aerobic: Supine: hip ER and IR  ROM x 10 3 sec holds; SKTC 15 sec x 5 on R with towel - education for HEP Seated:  LAQ 2 lb 2 x 10 bil ;   Ankle pumps 2 x 10 bil;   sit to stand 2 x 5 at higher mat table, with education on body mechanics .  Standing: Stretches:   Neuromuscular Re-education: Manual Therapy: Therapeutic Activity: Self Care: Gait: ambulation with SPC with education on use and sequencing, 45 ft x 6,  with  his walking stick x 4 Discussed using cane or walker at all times.    Previous:  Therapeutic Exercise: Aerobic: Supine: hip ER ROM x 10 3 sec holds;  Seated: LAQ x 10 bil ;   Ankle pumps 2 x 10 bil;   sit to stand x 5 at higher mat table, with education on body mechanics .  Standing: Stretches:   Neuromuscular Re-education: Manual Therapy: Therapeutic Activity: Self Care:    PATIENT EDUCATION:  Education details: PT POC, Exam findings,  HEP Person educated: Patient Education method: Explanation, Demonstration, Tactile cues, Verbal cues, and Handouts Education comprehension: verbalized understanding, returned demonstration, verbal cues required, tactile cues required, and needs further education   HOME EXERCISE PROGRAM: Access Code: [email or web address] URL: [email or web address] Date: 02/24/2024 Prepared by: Terrilee Few  Exercises - Seated Knee Extension AROM  - 1 x daily - 1-2 sets - 5-10 reps - Seated Ankle Pumps on Table  - 3 x daily - 1 sets - 10 reps - Bent Knee Fallouts  - 1 x daily - 1 sets - 10 reps - 3 hold  ASSESSMENT:  CLINICAL IMPRESSION: Pt with good tolerance for activities today. R hip very stiff, but minimal pain with ther ex. Reviewed gait safety and use of AD today, pt with good use of cane and walking stick. Recommended he continue to use AD at all times. Plan to progress LE mobility and strength, as well as gait and stairs as able.   Eval: Patient presents with primary complaint of  pain in R hip, and weakness in R hip/LE. He has stiffness in bil hips, R>L, with lack of ROM. He has decreased strength in R hip vs L, with feelings of instability with activity. He has decreased gait mechanics, and will benefit from education on use of AD. He has decreased ability and stability with functional activity, gait, and stairs. He is overall deconditioned and will benefit from education on HEP . Pt with decreased ability for full functional activities. Pt will  benefit from skilled PT to improve deficits and pain and to return to PLOF.   OBJECTIVE IMPAIRMENTS: Abnormal gait, decreased activity tolerance, decreased balance, decreased knowledge of use of DME, decreased mobility, difficulty walking, decreased ROM, decreased strength, decreased safety awareness, impaired flexibility, improper body mechanics, and pain.   ACTIVITY LIMITATIONS: bending, standing, squatting, stairs, transfers, and locomotion  level  PARTICIPATION LIMITATIONS: meal prep, cleaning, shopping, and community activity  PERSONAL FACTORS: Age, Past/current experiences, Time since onset of injury/illness/exacerbation, and 1-2 comorbidities: CA, recent surgery, OA  are also affecting patient's functional outcome.   REHAB POTENTIAL: Good  CLINICAL DECISION MAKING: Evolving/moderate complexity  EVALUATION COMPLEXITY: Moderate   GOALS: Goals reviewed with patient? Yes  SHORT TERM GOALS: Target date: 03/16/2024  Pt to be independent with initial HEP  Goal status: INITIAL  2.  Pt to demo independent transfer from regular chair, without use of UEs.   Goal status: INITIAL    LONG TERM GOALS: Target date: 04/20/2024  Pt to be independent with final HEP  Goal status: INITIAL  2.  Pt to demo improved strength of R hip to at least 4/5 for flex and abd, to improve ability and stability for gait.   Goal status: INITIAL  3.  Pt to report decreased pain to 0-2/10 in R hip with activity.   Goal status: INITIAL  4.  Pt to demo optimal mechanics and safety  for gait and stairs, with LRAD, to improve ability and safety with community activity.  Goal status: INITIAL    PLAN:  PT FREQUENCY: 1-2x/week  PT DURATION: 8 weeks  PLANNED INTERVENTIONS: Therapeutic exercises, Therapeutic activity, Neuromuscular re-education, Patient/Family education, Self Care, Joint mobilization, Joint manipulation, Stair training, Orthotic/Fit training, DME instructions, Aquatic Therapy, Dry Needling, Electrical stimulation, Cryotherapy, Moist heat, Taping, Ultrasound, Ionotophoresis 4mg /ml Dexamethasone, Manual therapy,  Vasopneumatic device, Traction, Spinal manipulation, Spinal mobilization,Balance training, Gait training,   PLAN FOR NEXT SESSION: stairs - 8 steps split level-    Terrilee Few, PT, DPT 1:36 PM  03/09/24

## 2024-03-14 ENCOUNTER — Ambulatory Visit: Admitting: Physical Therapy

## 2024-03-14 ENCOUNTER — Encounter: Payer: Self-pay | Admitting: Physical Therapy

## 2024-03-14 DIAGNOSIS — R2689 Other abnormalities of gait and mobility: Secondary | ICD-10-CM

## 2024-03-14 DIAGNOSIS — M25551 Pain in right hip: Secondary | ICD-10-CM | POA: Diagnosis not present

## 2024-03-14 DIAGNOSIS — M6281 Muscle weakness (generalized): Secondary | ICD-10-CM | POA: Diagnosis not present

## 2024-03-14 NOTE — Therapy (Signed)
 OUTPATIENT PHYSICAL THERAPY LOWER EXTREMITY TREATMENT   Patient Name: [name] [name] MRN: [id number] DOB:23-Oct-1932, 89 y.o., male Today's Date: 03/14/2024  END OF SESSION:  PT End of Session - 03/14/24 0931     Visit Number 3    Number of Visits 16    Authorization Type [employer] medicare    PT Start Time 0932    PT Stop Time 1015    PT Time Calculation (min) 43 min    Activity Tolerance Patient tolerated treatment well    Behavior During Therapy WFL for tasks assessed/performed             Past Medical History:  Diagnosis Date   Anemia    Arthritis    lower back bulging disc, hips, knees, thumbs, shoulder   Asthma    as a pre teen   Bladder cancer (HCC) UROLOGIST-  DR MCKENZIE   S/P TURBT 08-16-2017   Bladder wall hemorrhage 09/28/2023   BPH (benign prostatic hyperplasia)    CKD (chronic kidney disease), stage III (HCC)    STAGE 3  B CKD per lov 10-16-2020 dr Cathalene Clipper Seretha Dance kidney on chart   Dyspnea    Fatigue    MIDDLE OF DAY ON OCCASION   Frequency of urination    GERD (gastroesophageal reflux disease)    on protonix    Hematuria    Hematuria    History of colon polyps    HLD (hyperlipidemia)    HOH (hard of hearing)    slightly hoh right worse thanleft   HTN (hypertension)    Hypothyroidism    Incontinence of urine    weras pads all the time   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   Right Buccal Cheek   Pre-diabetes    Recurrent bladder papillary carcinoma (HCC) hx 2012 and 2014-- urologist- dr Claretta Croft   s/p  TURBT 08-16-2017  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   Renal mass 09/02/2022   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 10/02/2023   Sleep apnea    uses cpap set on 20 /4   Trigger finger of left hand 07/04/2021   last few months per pt   UTI (urinary tract infection) 08/20/2020   Weakness of extremity    left knee   Wears glasses    Past Surgical History:  Procedure Laterality Date    BCG X 2     AFTER LAST 2 BLADDER TUMOR REMOVALS   CATARACT EXTRACTION, BILATERAL     COLONSCOPY  YRS AGO   CYSTOSCOPY N/A 10/29/2020   Procedure: CYSTOSCOPY;  Surgeon: Roxane Copp, MD;  Location: [facility];  Service: Urology;  Laterality: N/A;   CYSTOSCOPY N/A 07/08/2021   Procedure: CYSTOSCOPY;  Surgeon: Roxane Copp, MD;  Location: [facility];  Service: Urology;  Laterality: N/A;   CYSTOSCOPY W/ RETROGRADES Right 07/21/2023   Procedure: CYSTOSCOPY WITH RIGHT RETROGRADE PYELOGRAM;  Surgeon: Melody Spurling., MD;  Location: [facility];  Service: Urology;  Laterality: Right;   CYSTOSCOPY W/ URETERAL STENT PLACEMENT Bilateral 08/16/2017   Procedure: CYSTOSCOPY WITH RETROGRADE PYELOGRAM/URETERAL STENT PLACEMENT;  Surgeon: Marco Severs, MD;  Location[facility];  Service: Urology;  Laterality: Bilateral;   CYSTOSCOPY W/ URETERAL STENT PLACEMENT Left 09/02/2022   Procedure: CYSTOSCOPY WITH RETROGRADE PYELOGRAM, URETERAL STENT PLACEMENT;  Surgeon: Osborn Blaze, MD;  Location: [facility];  Service: Urology;  Laterality: Left;   CYSTOSCOPY WITH BIOPSY N/A 06/25/2020   Procedure:  CYSTOSCOPY FULGURATION   BLADDER  BIOPSY;  Surgeon: Roxane Copp, MD;  Location: [facility];  Service: Urology;  Laterality: N/A;   CYSTOSCOPY WITH FULGERATION N/A 12/08/2019   Procedure: CYSTOSCOPY WITH FULGERATION;  Surgeon: Marco Severs, MD;  Location: [facility];  Service: Urology;  Laterality: N/A;   CYSTOSCOPY WITH FULGERATION N/A 03/24/2022   Procedure: CYSTOSCOPY WITH FULGERATION;  Surgeon: Roxane Copp, MD;  Location: [facility];  Service: Urology;  Laterality: N/A;   CYSTOSCOPY WITH RETROGRADE PYELOGRAM, URETEROSCOPY AND STENT PLACEMENT Left 03/24/2022   Procedure: CYSTOSCOPY WITH RETROGRADE PYELOGRAM, DIAGNOSTIC URETEROSCOPY WITH BIOPSY AND STENT PLACEMENT;  Surgeon: Roxane Copp, MD;  Location: [facility];   Service: Urology;  Laterality: Left;  90 MINUTES   CYSTOSCOPY WITH RETROGRADE PYELOGRAM, URETEROSCOPY AND STENT PLACEMENT Left 04/07/2022   Procedure: CYSTOSCOPY WITH  RETROGRADE PYELOGRAM, DIAGNOSTIC URETEROSCOPY WITH BIOPSY AND STENT EXCHANGE;  Surgeon: Roxane Copp, MD;  Location: [facility];  Service: Urology;  Laterality: Left;  75 MINUTES   CYSTOSCOPY WITH RETROGRADE PYELOGRAM, URETEROSCOPY AND STENT PLACEMENT Left 08/11/2022   Procedure: CYSTOSCOPY WITH RETROGRADE PYELOGRAM, ANTEGRADE NEPHROSTOGRAM, DIAGNOSTIC  URETEROSCOPY;  Surgeon: Roxane Copp, MD;  Location: [facility];  Service: Urology;  Laterality: Left;  90 MINS   HOLMIUM LASER APPLICATION Left 08/11/2022   Procedure: HOLMIUM LASER APPLICATION;  Surgeon: Roxane Copp, MD;  Location: [facility];  Service: Urology;  Laterality: Left;   IR NEPHROSTOMY EXCHANGE LEFT  05/15/2022   IR NEPHROSTOMY EXCHANGE LEFT  07/10/2022   IR NEPHROSTOMY PLACEMENT LEFT  05/06/2022   KNEE ARTHROSCOPY Left 06/10/2011   LYMPH NODE DISSECTION Left 09/02/2022   Procedure: RETROPERITONEAL LYMPH NODE DISSECTION;  Surgeon: Osborn Blaze, MD;  Location: [facility];  Service: Urology;  Laterality: Left;   PROSTATE BIOPSY  2000   ROBOT ASSITED LAPAROSCOPIC NEPHROURETERECTOMY Left 09/02/2022   Procedure: XI ROBOT ASSITED LAPAROSCOPIC NEPHROURETERECTOMY;  Surgeon: Osborn Blaze, MD;  Location: [facility];  Service: Urology;  Laterality: Left;   ROBOT LAP RADICAL CYSTOPROSTATECTOMY PELVIC LYMPHADENECTOMY, NEOBLADDER N/A 02/04/2024   Procedure: ROBOT ASSISTED LAPAROSCOPIC RADICAL CYSTOPROSTATECTOMY;  Surgeon: Melody Spurling., MD;  Location: [facility];  Service: Urology;  Laterality: N/A;  300 MINUTES NEEDED FOR CASE   TRANSURETHRAL RESECTION OF BLADDER TUMOR N/A 08/16/2017   Procedure: TRANSURETHRAL RESECTION OF BLADDER TUMOR (TURBT);  Surgeon: Marco Severs, MD;  Location: [facility];  Service: Urology;  Laterality: N/A;   TRANSURETHRAL RESECTION OF  BLADDER TUMOR  12-09-2012   dr Lavona Pounds [facility]   and TURP   TRANSURETHRAL RESECTION OF BLADDER TUMOR N/A 09/20/2017   Procedure: TRANSURETHRAL RESECTION OF BLADDER TUMOR (TURBT);  Surgeon: Marco Severs, MD;  Location: [facility];  Service: Urology;  Laterality: N/A;   TRANSURETHRAL RESECTION OF BLADDER TUMOR N/A 12/08/2019   Procedure: TRANSURETHRAL RESECTION OF BLADDER TUMOR;  Surgeon: Marco Severs, MD;  Location: [facility];  Service: Urology;  Laterality: N/A;  1 HR   TRANSURETHRAL RESECTION OF BLADDER TUMOR N/A 10/29/2020   Procedure: BLADDER FULGERATION;  Surgeon: Roxane Copp, MD;  Location: [facility];  Service: Urology;  Laterality: N/A;  30 MINS   TRANSURETHRAL RESECTION OF BLADDER TUMOR N/A 07/08/2021   Procedure: TRANSURETHRAL RESECTION OF BLADDER TUMOR (TURBT)/FULGERATION;  Surgeon: Roxane Copp, MD;  Location: [facility];  Service: Urology;  Laterality: N/A;  45 MINS   TRANSURETHRAL RESECTION OF BLADDER TUMOR N/A 12/02/2021  Procedure: TRANSURETHRAL RESECTION OF BLADDER TUMOR (TURBT);  Surgeon: Roxane Copp, MD;  Location: [facility];  Service: Urology;  Laterality: N/A;   TRANSURETHRAL RESECTION OF BLADDER TUMOR N/A 07/21/2023   Procedure: TRANSURETHRAL RESECTION OF BLADDER TUMOR (TURBT);  Surgeon: Melody Spurling., MD;  Location: [facility];  Service: Urology;  Laterality: N/A;  60 MINUTES   TRANSURETHRAL RESECTION OF BLADDER TUMOR N/A 10/01/2023   Procedure: TRANSURETHRAL RESECTION OF BLADDER TUMOR (TURBT)/CLOT EVACUATION WITH FULGERATION;  Surgeon: Melody Spurling., MD;  Location: [facility];  Service: Urology;  Laterality: N/A;   TRANSURETHRAL RESECTION OF PROSTATE  2011   UPPER GI ENDOSCOPY  YRS AGO   URETEROSCOPY Right 02/04/2024   Procedure: RIGHT CUTANEOUS URETEROSCOPY;  Surgeon: Melody Spurling., MD;  Location: [facility];  Service: Urology;  Laterality: Right;   Patient Active  Problem List   Diagnosis Date Noted   Prostatic adenocarcinoma (HCC) 03/03/2024   UTI (urinary tract infection) 03/02/2024   Complicated UTI (urinary tract infection) 03/02/2024   Ureterostomy status (HCC) 02/15/2024   History of iron deficiency 12/20/2023   Dysuria 10/15/2023   AKI (acute kidney injury) (HCC) 10/08/2023   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 10/02/2023   Decreased appetite 09/28/2023   Hardening of the aorta (main artery of the heart) (HCC) 09/28/2023   Mixed hyperlipidemia 09/28/2023   Prediabetes 09/28/2023   Panic attack 09/28/2023   Pedal edema 09/28/2023   Osteoarthritis 09/28/2023   Sinusitis chronic, sphenoidal 09/28/2023   Acute blood loss anemia (ABLA) 09/28/2023   Hypothyroidism 09/28/2023   Low serum vitamin B12 09/10/2023   Anemia of chronic disease 09/10/2023   Normocytic anemia 08/06/2023   Unintentional weight loss 04/09/2021   Leg weakness, bilateral 04/09/2021   Surgical counseling visit 04/09/2021   Essential hypertension 05/23/2020   Sleep apnea treated with nocturnal BiPAP 01/25/2020   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   Hypersomnia with sleep apnea 09/15/2019   Cardiac arrhythmia due to premature depolarization 09/15/2019   Excessive daytime sleepiness 09/15/2019   CKD (chronic kidney disease) stage 4, GFR 15-29 ml/min (HCC) - Baseline Scr 3.0 09/15/2019   Malignant neoplasm of urinary bladder (HCC) 09/15/2019   Gross hematuria 09/11/2017   Benign prostatic hyperplasia without lower urinary tract symptoms 12/09/2012   GERD (gastroesophageal reflux disease) 11/28/2012     PCP: Virgle Grime  REFERRING PROVIDER: Virgle Grime  REFERRING DIAG: Leg weakness   THERAPY DIAG:  Pain in right hip  Muscle weakness (generalized)  Other abnormalities of gait and mobility  Rationale for Evaluation and Treatment: Rehabilitation  ONSET DATE:    SUBJECTIVE:   SUBJECTIVE STATEMENT: Pt states R hip  feeling more sore this week. Has started using RW due to pain. Feels it helps with walking .   Eval:  Pt states weakness in legs R>L ongoing for a while. He had significant weakness previously from medication issues but has slightly improved. He states pain in his R hip, sharp pains with twisting at times, and instability feeling with walking and activity due to hip pain.He has had previous L knee surgery, states R hip OA, and has had recent surgery for cancer, with bladder and prostate removal. Since then he states feeling pretty good, he does have swelling in feet and legs, just saw Dr this week who was not concerned. He is wearing sandals today, with obvious swelling in feet and legs.  He used to walk about 2 mi/day in past years, recently he did walk  about 1.5 miles States minimal fatigue with endurance, but more limitation from R hip and leg weakness.  He is currently living with son and his wife in split level house. States doing ok with stairs for now. Has walking stick, but not using today, does usually use most of the time.    PERTINENT HISTORY: Recent bladder and prostate removal for CA, CKD,  Nephrectomy (L),    PAIN:  Are you having pain? Yes: NPRS scale: 5/10    Pain location: R hip/anterior/lateral Pain description: sore, stiff, shooting with twisting motion Aggravating factors: increased activity , initial standing  Relieving factors: none stated   PRECAUTIONS: None  WEIGHT BEARING RESTRICTIONS: No  FALLS:  Has patient fallen in last 6 months? No  PLOF: Independent  PATIENT GOALS:  Decreased pain in hip, improve walking and balance.   NEXT MD VISIT:   OBJECTIVE:   DIAGNOSTIC FINDINGS:   PATIENT SURVEYS:    COGNITION: Overall cognitive status: Within functional limits for tasks assessed     SENSATION: WFL  EDEMA: mod/significant edema in bil lower legs and feet.    POSTURE:    No Significant postural limitations  PALPATION:     LOWER EXTREMITY  ROM: Hips:  L: mild limitation for flex, ER, mod for IR,   R:  mod limitation for all motions.   LOWER EXTREMITY MMT:  MMT Left eval Right  eval  Hip flexion 4- 3-  Hip extension    Hip abduction    Hip adduction    Hip internal rotation    Hip external rotation    Knee flexion 4 4  Knee extension 4+ 4+  Ankle dorsiflexion    Ankle plantarflexion    Ankle inversion    Ankle eversion     (Blank rows = not tested)  LOWER EXTREMITY SPECIAL TESTS:    FUNCTIONAL TESTS:    GAIT: Distance walked: 165ft Assistive device utilized: None Level of assistance: Complete Independence Comments: antalgic gait for R hip, decreased hip extension on R. , decreased stability   TODAY'S TREATMENT:                                                                                                                              DATE:   03/14/2024 Therapeutic Exercise: Aerobic: Supine: hip ER and IR  ROM x 10 3 sec holds;  SKTC 15 sec x 5 on R with towel - education for HEP;  Clams GTB x 20;  Seated:   sit to stand 2 x 5 at higher mat table, with education on body mechanics .  Standing: Stretches:   Neuromuscular Re-education: Manual Therapy:  STM/tennis ball to R low lumbar paraspinals and R glute med, piriformis Therapeutic Activity: Self Care: Education on compression socks- will ask Dr about wearing, ways to elevate feet for decreased swelling in chair and bed, discussed referral to sports med for ongoing hip pain.  Gait: ambulation with RW with education on  use and sequencing, 45 ft x 6,  cuing for increased step height on L.    Therapeutic Exercise: Aerobic: Supine: hip ER and IR  ROM x 10 3 sec holds; SKTC 15 sec x 5 on R with towel - education for HEP Seated:  LAQ 2 lb 2 x 10 bil ;   Ankle pumps 2 x 10 bil;   sit to stand 2 x 5 at higher mat table, with education on body mechanics .  Standing: Stretches:   Neuromuscular Re-education: Manual Therapy: Therapeutic Activity: Self  Care: Gait: ambulation with SPC with education on use and sequencing, 45 ft x 6,  with his walking stick x 4 Discussed using cane or walker at all times.    Previous:  Therapeutic Exercise: Aerobic: Supine: hip ER ROM x 10 3 sec holds;  Seated: LAQ x 10 bil ;   Ankle pumps 2 x 10 bil;   sit to stand x 5 at higher mat table, with education on body mechanics .  Standing: Stretches:   Neuromuscular Re-education: Manual Therapy: Therapeutic Activity: Self Care:    PATIENT EDUCATION:  Education details: PT POC, Exam findings, HEP Person educated: Patient Education method: Explanation, Demonstration, Tactile cues, Verbal cues, and Handouts Education comprehension: verbalized understanding, returned demonstration, verbal cues required, tactile cues required, and needs further education   HOME EXERCISE PROGRAM: Access Code: [email or web address] URL: [email or web address] Date: 02/24/2024 Prepared by: Terrilee Few  Exercises - Seated Knee Extension AROM  - 1 x daily - 1-2 sets - 5-10 reps - Seated Ankle Pumps on Table  - 3 x daily - 1 sets - 10 reps - Bent Knee Fallouts  - 1 x daily - 1 sets - 10 reps - 3 hold  ASSESSMENT:  CLINICAL IMPRESSION:  Pt with good tolerance for activities today. R hip very stiff, but minimal pain with ther ex. Reviewed gait with RW, pt will continue to use due to pain in hip and stability. Pt has been having ongoing pain in hip for some time, discussed referral to sports med to closer look at hip. Pain localized in posterior hip/glute today with activity and palpation. Will benefit from continued mobility of back and hip and progressive/light strength for LEs as tolerated.   Eval: Patient presents with primary complaint of  pain in R hip, and weakness in R hip/LE. He has stiffness in bil hips, R>L, with lack of ROM. He has decreased strength in R hip vs L, with feelings of instability with activity. He has decreased gait mechanics, and will benefit  from education on use of AD. He has decreased ability and stability with functional activity, gait, and stairs. He is overall deconditioned and will benefit from education on HEP . Pt with decreased ability for full functional activities. Pt will  benefit from skilled PT to improve deficits and pain and to return to PLOF.   OBJECTIVE IMPAIRMENTS: Abnormal gait, decreased activity tolerance, decreased balance, decreased knowledge of use of DME, decreased mobility, difficulty walking, decreased ROM, decreased strength, decreased safety awareness, impaired flexibility, improper body mechanics, and pain.   ACTIVITY LIMITATIONS: bending, standing, squatting, stairs, transfers, and locomotion level  PARTICIPATION LIMITATIONS: meal prep, cleaning, shopping, and community activity  PERSONAL FACTORS: Age, Past/current experiences, Time since onset of injury/illness/exacerbation, and 1-2 comorbidities: CA, recent surgery, OA  are also affecting patient's functional outcome.   REHAB POTENTIAL: Good  CLINICAL DECISION MAKING: Evolving/moderate complexity  EVALUATION COMPLEXITY: Moderate   GOALS: Goals reviewed with patient? Yes  SHORT  TERM GOALS: Target date: 03/16/2024  Pt to be independent with initial HEP  Goal status: INITIAL  2.  Pt to demo independent transfer from regular chair, without use of UEs.   Goal status: INITIAL    LONG TERM GOALS: Target date: 04/20/2024  Pt to be independent with final HEP  Goal status: INITIAL  2.  Pt to demo improved strength of R hip to at least 4/5 for flex and abd, to improve ability and stability for gait.   Goal status: INITIAL  3.  Pt to report decreased pain to 0-2/10 in R hip with activity.   Goal status: INITIAL  4.  Pt to demo optimal mechanics and safety  for gait and stairs, with LRAD, to improve ability and safety with community activity.   Goal status: INITIAL    PLAN:  PT FREQUENCY: 1-2x/week  PT DURATION: 8  weeks  PLANNED INTERVENTIONS: Therapeutic exercises, Therapeutic activity, Neuromuscular re-education, Patient/Family education, Self Care, Joint mobilization, Joint manipulation, Stair training, Orthotic/Fit training, DME instructions, Aquatic Therapy, Dry Needling, Electrical stimulation, Cryotherapy, Moist heat, Taping, Ultrasound, Ionotophoresis 4mg /ml Dexamethasone , Manual therapy,  Vasopneumatic device, Traction, Spinal manipulation, Spinal mobilization,Balance training, Gait training,   PLAN FOR NEXT SESSION: stairs - 8 steps split level-    Terrilee Few, PT, DPT 9:31 AM  03/14/24

## 2024-03-16 ENCOUNTER — Ambulatory Visit: Admitting: Physical Therapy

## 2024-03-16 DIAGNOSIS — M25551 Pain in right hip: Secondary | ICD-10-CM | POA: Diagnosis not present

## 2024-03-16 DIAGNOSIS — M6281 Muscle weakness (generalized): Secondary | ICD-10-CM

## 2024-03-16 DIAGNOSIS — R2689 Other abnormalities of gait and mobility: Secondary | ICD-10-CM | POA: Diagnosis not present

## 2024-03-16 NOTE — Therapy (Signed)
 OUTPATIENT PHYSICAL THERAPY LOWER EXTREMITY TREATMENT   Patient Name: [name] [name] MRN: [id number] DOB:January 16, 1932, 89 y.o., male Today's Date: 03/16/2024  END OF SESSION:  PT End of Session - 03/18/24 2104     Visit Number 4    Number of Visits 16    Authorization Type [employer] medicare    PT Start Time 1432    PT Stop Time 1514    PT Time Calculation (min) 42 min    Activity Tolerance Patient tolerated treatment well    Behavior During Therapy WFL for tasks assessed/performed              Past Medical History:  Diagnosis Date   Anemia    Arthritis    lower back bulging disc, hips, knees, thumbs, shoulder   Asthma    as a pre teen   Bladder cancer (HCC) UROLOGIST-  DR MCKENZIE   S/P TURBT 08-16-2017   Bladder wall hemorrhage 09/28/2023   BPH (benign prostatic hyperplasia)    CKD (chronic kidney disease), stage III (HCC)    STAGE 3  B CKD per lov 10-16-2020 dr Cathalene Clipper Seretha Dance kidney on chart   Dyspnea    Fatigue    MIDDLE OF DAY ON OCCASION   Frequency of urination    GERD (gastroesophageal reflux disease)    on protonix    Hematuria    Hematuria    History of colon polyps    HLD (hyperlipidemia)    HOH (hard of hearing)    slightly hoh right worse thanleft   HTN (hypertension)    Hypothyroidism    Incontinence of urine    weras pads all the time   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   Right Buccal Cheek   Pre-diabetes    Recurrent bladder papillary carcinoma (HCC) hx 2012 and 2014-- urologist- dr Claretta Croft   s/p  TURBT 08-16-2017  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   Renal mass 09/02/2022   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 10/02/2023   Sleep apnea    uses cpap set on 20 /4   Trigger finger of left hand 07/04/2021   last few months per pt   UTI (urinary tract infection) 08/20/2020   Weakness of extremity    left knee   Wears glasses    Past Surgical History:  Procedure Laterality Date    BCG X 2     AFTER LAST 2 BLADDER TUMOR REMOVALS   CATARACT EXTRACTION, BILATERAL     COLONSCOPY  YRS AGO   CYSTOSCOPY N/A 10/29/2020   Procedure: CYSTOSCOPY;  Surgeon: Roxane Copp, MD;  Location: [facility];  Service: Urology;  Laterality: N/A;   CYSTOSCOPY N/A 07/08/2021   Procedure: CYSTOSCOPY;  Surgeon: Roxane Copp, MD;  Location: [facility];  Service: Urology;  Laterality: N/A;   CYSTOSCOPY W/ RETROGRADES Right 07/21/2023   Procedure: CYSTOSCOPY WITH RIGHT RETROGRADE PYELOGRAM;  Surgeon: Melody Spurling., MD;  Location: [facility];  Service: Urology;  Laterality: Right;   CYSTOSCOPY W/ URETERAL STENT PLACEMENT Bilateral 08/16/2017   Procedure: CYSTOSCOPY WITH RETROGRADE PYELOGRAM/URETERAL STENT PLACEMENT;  Surgeon: Marco Severs, MD;  Location: [facility];  Service: Urology;  Laterality: Bilateral;   CYSTOSCOPY W/ URETERAL STENT PLACEMENT Left 09/02/2022   Procedure: CYSTOSCOPY WITH RETROGRADE PYELOGRAM, URETERAL STENT PLACEMENT;  Surgeon: Osborn Blaze, MD;  Location: [facility];  Service: Urology;  Laterality: Left;   CYSTOSCOPY WITH BIOPSY N/A 06/25/2020  Procedure: CYSTOSCOPY FULGURATION   BLADDER  BIOPSY;  Surgeon: Roxane Copp, MD;  Location: [facility];  Service: Urology;  Laterality: N/A;   CYSTOSCOPY WITH FULGERATION N/A 12/08/2019   Procedure: CYSTOSCOPY WITH FULGERATION;  Surgeon: Marco Severs, MD;  Location: [facility];  Service: Urology;  Laterality: N/A;   CYSTOSCOPY WITH FULGERATION N/A 03/24/2022   Procedure: CYSTOSCOPY WITH FULGERATION;  Surgeon: Roxane Copp, MD;  Location: [facility];  Service: Urology;  Laterality: N/A;   CYSTOSCOPY WITH RETROGRADE PYELOGRAM, URETEROSCOPY AND STENT PLACEMENT Left 03/24/2022   Procedure: CYSTOSCOPY WITH RETROGRADE PYELOGRAM, DIAGNOSTIC URETEROSCOPY WITH BIOPSY AND STENT PLACEMENT;  Surgeon: Roxane Copp, MD;  Location: [facility];   Service: Urology;  Laterality: Left;  90 MINUTES   CYSTOSCOPY WITH RETROGRADE PYELOGRAM, URETEROSCOPY AND STENT PLACEMENT Left 04/07/2022   Procedure: CYSTOSCOPY WITH  RETROGRADE PYELOGRAM, DIAGNOSTIC URETEROSCOPY WITH BIOPSY AND STENT EXCHANGE;  Surgeon: Roxane Copp, MD;  Location: [facility];  Service: Urology;  Laterality: Left;  75 MINUTES   CYSTOSCOPY WITH RETROGRADE PYELOGRAM, URETEROSCOPY AND STENT PLACEMENT Left 08/11/2022   Procedure: CYSTOSCOPY WITH RETROGRADE PYELOGRAM, ANTEGRADE NEPHROSTOGRAM, DIAGNOSTIC  URETEROSCOPY;  Surgeon: Roxane Copp, MD;  Location: [facility];  Service: Urology;  Laterality: Left;  90 MINS   HOLMIUM LASER APPLICATION Left 08/11/2022   Procedure: HOLMIUM LASER APPLICATION;  Surgeon: Roxane Copp, MD;  Location: [facility];  Service: Urology;  Laterality: Left;   IR NEPHROSTOMY EXCHANGE LEFT  05/15/2022   IR NEPHROSTOMY EXCHANGE LEFT  07/10/2022   IR NEPHROSTOMY PLACEMENT LEFT  05/06/2022   KNEE ARTHROSCOPY Left 06/10/2011   LYMPH NODE DISSECTION Left 09/02/2022   Procedure: RETROPERITONEAL LYMPH NODE DISSECTION;  Surgeon: Osborn Blaze, MD;  Location: [facility];  Service: Urology;  Laterality: Left;   PROSTATE BIOPSY  2000   ROBOT ASSITED LAPAROSCOPIC NEPHROURETERECTOMY Left 09/02/2022   Procedure: XI ROBOT ASSITED LAPAROSCOPIC NEPHROURETERECTOMY;  Surgeon: Osborn Blaze, MD;  Location: [facility];  Service: Urology;  Laterality: Left;   ROBOT LAP RADICAL CYSTOPROSTATECTOMY PELVIC LYMPHADENECTOMY, NEOBLADDER N/A 02/04/2024   Procedure: ROBOT ASSISTED LAPAROSCOPIC RADICAL CYSTOPROSTATECTOMY;  Surgeon: Melody Spurling., MD;  Location: [facility];  Service: Urology;  Laterality: N/A;  300 MINUTES NEEDED FOR CASE   TRANSURETHRAL RESECTION OF BLADDER TUMOR N/A 08/16/2017   Procedure: TRANSURETHRAL RESECTION OF BLADDER TUMOR (TURBT);  Surgeon: Marco Severs, MD;  Location: [facility];  Service: Urology;  Laterality: N/A;   TRANSURETHRAL RESECTION OF  BLADDER TUMOR  12-09-2012   dr Lavona Pounds [facility]   and TURP   TRANSURETHRAL RESECTION OF BLADDER TUMOR N/A 09/20/2017   Procedure: TRANSURETHRAL RESECTION OF BLADDER TUMOR (TURBT);  Surgeon: Marco Severs, MD;  Location: [facility];  Service: Urology;  Laterality: N/A;   TRANSURETHRAL RESECTION OF BLADDER TUMOR N/A 12/08/2019   Procedure: TRANSURETHRAL RESECTION OF BLADDER TUMOR;  Surgeon: Marco Severs, MD;  Location: [facility];  Service: Urology;  Laterality: N/A;  1 HR   TRANSURETHRAL RESECTION OF BLADDER TUMOR N/A 10/29/2020   Procedure: BLADDER FULGERATION;  Surgeon: Roxane Copp, MD;  Location: [facility];  Service: Urology;  Laterality: N/A;  30 MINS   TRANSURETHRAL RESECTION OF BLADDER TUMOR N/A 07/08/2021   Procedure: TRANSURETHRAL RESECTION OF BLADDER TUMOR (TURBT)/FULGERATION;  Surgeon: Roxane Copp, MD;  Location: [facility];  Service: Urology;  Laterality: N/A;  45 MINS   TRANSURETHRAL RESECTION OF BLADDER TUMOR N/A  12/02/2021   Procedure: TRANSURETHRAL RESECTION OF BLADDER TUMOR (TURBT);  Surgeon: Roxane Copp, MD;  Location: [facility];  Service: Urology;  Laterality: N/A;   TRANSURETHRAL RESECTION OF BLADDER TUMOR N/A 07/21/2023   Procedure: TRANSURETHRAL RESECTION OF BLADDER TUMOR (TURBT);  Surgeon: Melody Spurling., MD;  Location: [facility];  Service: Urology;  Laterality: N/A;  60 MINUTES   TRANSURETHRAL RESECTION OF BLADDER TUMOR N/A 10/01/2023   Procedure: TRANSURETHRAL RESECTION OF BLADDER TUMOR (TURBT)/CLOT EVACUATION WITH FULGERATION;  Surgeon: Melody Spurling., MD;  Location: [facility];  Service: Urology;  Laterality: N/A;   TRANSURETHRAL RESECTION OF PROSTATE  2011   UPPER GI ENDOSCOPY  YRS AGO   URETEROSCOPY Right 02/04/2024   Procedure: RIGHT CUTANEOUS URETEROSCOPY;  Surgeon: Melody Spurling., MD;  Location: [facility];  Service: Urology;  Laterality: Right;   Patient Active  Problem List   Diagnosis Date Noted   Prostatic adenocarcinoma (HCC) 03/03/2024   UTI (urinary tract infection) 03/02/2024   Complicated UTI (urinary tract infection) 03/02/2024   Ureterostomy status (HCC) 02/15/2024   History of iron deficiency 12/20/2023   Dysuria 10/15/2023   AKI (acute kidney injury) (HCC) 10/08/2023   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 10/02/2023   Decreased appetite 09/28/2023   Hardening of the aorta (main artery of the heart) (HCC) 09/28/2023   Mixed hyperlipidemia 09/28/2023   Prediabetes 09/28/2023   Panic attack 09/28/2023   Pedal edema 09/28/2023   Osteoarthritis 09/28/2023   Sinusitis chronic, sphenoidal 09/28/2023   Acute blood loss anemia (ABLA) 09/28/2023   Hypothyroidism 09/28/2023   Low serum vitamin B12 09/10/2023   Anemia of chronic disease 09/10/2023   Normocytic anemia 08/06/2023   Unintentional weight loss 04/09/2021   Leg weakness, bilateral 04/09/2021   Surgical counseling visit 04/09/2021   Essential hypertension 05/23/2020   Sleep apnea treated with nocturnal BiPAP 01/25/2020   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   Hypersomnia with sleep apnea 09/15/2019   Cardiac arrhythmia due to premature depolarization 09/15/2019   Excessive daytime sleepiness 09/15/2019   CKD (chronic kidney disease) stage 4, GFR 15-29 ml/min (HCC) - Baseline Scr 3.0 09/15/2019   Malignant neoplasm of urinary bladder (HCC) 09/15/2019   Gross hematuria 09/11/2017   Benign prostatic hyperplasia without lower urinary tract symptoms 12/09/2012   GERD (gastroesophageal reflux disease) 11/28/2012     PCP: Virgle Grime  REFERRING PROVIDER: Virgle Grime  REFERRING DIAG: Leg weakness   THERAPY DIAG:  Pain in right hip  Muscle weakness (generalized)  Other abnormalities of gait and mobility  Rationale for Evaluation and Treatment: Rehabilitation  ONSET DATE:    SUBJECTIVE:   SUBJECTIVE STATEMENT: Pt states Near fall  at bottom of steps yesterday. Was using RW, was able to catch himself ,but states his knees and hip "gave out".  He is Seeing sports med Monday for hip pain. He is Still using RW due to increased pain in the last week or so.   Eval:  Pt states weakness in legs R>L ongoing for a while. He had significant weakness previously from medication issues but has slightly improved. He states pain in his R hip, sharp pains with twisting at times, and instability feeling with walking and activity due to hip pain.He has had previous L knee surgery, states R hip OA, and has had recent surgery for cancer, with bladder and prostate removal. Since then he states feeling pretty good, he does have swelling in feet and legs, just saw Dr this week  who was not concerned. He is wearing sandals today, with obvious swelling in feet and legs.  He used to walk about 2 mi/day in past years, recently he did walk about 1.5 miles States minimal fatigue with endurance, but more limitation from R hip and leg weakness.  He is currently living with son and his wife in split level house. States doing ok with stairs for now. Has walking stick, but not using today, does usually use most of the time.    PERTINENT HISTORY: Recent bladder and prostate removal for CA, CKD,  Nephrectomy (L),    PAIN:  Are you having pain? Yes: NPRS scale: 5/10    Pain location: R hip/anterior/lateral Pain description: sore, stiff, shooting with twisting motion Aggravating factors: increased activity , initial standing  Relieving factors: none stated   PRECAUTIONS: None  WEIGHT BEARING RESTRICTIONS: No  FALLS:  Has patient fallen in last 6 months? No  PLOF: Independent  PATIENT GOALS:  Decreased pain in hip, improve walking and balance.   NEXT MD VISIT:   OBJECTIVE:   DIAGNOSTIC FINDINGS:   PATIENT SURVEYS:    COGNITION: Overall cognitive status: Within functional limits for tasks assessed     SENSATION: WFL  EDEMA: mod/significant  edema in bil lower legs and feet.    POSTURE:    No Significant postural limitations  PALPATION:     LOWER EXTREMITY ROM: Hips:  L: mild limitation for flex, ER, mod for IR,   R:  mod limitation for all motions.   LOWER EXTREMITY MMT:  MMT Left eval Right  eval  Hip flexion 4- 3-  Hip extension    Hip abduction    Hip adduction    Hip internal rotation    Hip external rotation    Knee flexion 4 4  Knee extension 4+ 4+  Ankle dorsiflexion    Ankle plantarflexion    Ankle inversion    Ankle eversion     (Blank rows = not tested)  LOWER EXTREMITY SPECIAL TESTS:    FUNCTIONAL TESTS:    GAIT: Distance walked: 115ft Assistive device utilized: None Level of assistance: Complete Independence Comments: antalgic gait for R hip, decreased hip extension on R. , decreased stability   TODAY'S TREATMENT:                                                                                                                              DATE:   03/16/2024 Therapeutic Exercise: Aerobic: Supine: hip ER  ROM x 10 3 sec holds;  SKTC 15 sec x 5 on R with towel ;   PROM for R hip, flex, IR, ER,  Clams GTB x 20;  Seated:    Aaron Aas  Standing: Stretches:    LTR x 15;  Neuromuscular Re-education: Manual Therapy:  STM/tennis ball to R low lumbar paraspinals and R glute med, piriformis Therapeutic Activity: sit to stand 2 x 5 at higher mat table, with education on  body mechanics Stairs: up/down 5 steps with 2 hands on 1 rail to mimic home environment, step to pattern, cueing and education on safety and mechanics,  x 4  Self Care:   Gait:     Therapeutic Exercise: Aerobic: Supine: hip ER and IR  ROM x 10 3 sec holds; SKTC 15 sec x 5 on R with towel - education for HEP Seated:  LAQ 2 lb 2 x 10 bil ;   Ankle pumps 2 x 10 bil;   sit to stand 2 x 5 at higher mat table, with education on body mechanics .  Standing: Stretches:   Neuromuscular Re-education: Manual Therapy: Therapeutic  Activity: Self Care: Gait: ambulation with SPC with education on use and sequencing, 45 ft x 6,  with his walking stick x 4 Discussed using cane or walker at all times.    Previous:  Therapeutic Exercise: Aerobic: Supine: hip ER ROM x 10 3 sec holds;  Seated: LAQ x 10 bil ;   Ankle pumps 2 x 10 bil;   sit to stand x 5 at higher mat table, with education on body mechanics .  Standing: Stretches:   Neuromuscular Re-education: Manual Therapy: Therapeutic Activity: Self Care:    PATIENT EDUCATION:  Education details: PT POC, Exam findings, HEP Person educated: Patient Education method: Explanation, Demonstration, Tactile cues, Verbal cues, and Handouts Education comprehension: verbalized understanding, returned demonstration, verbal cues required, tactile cues required, and needs further education   HOME EXERCISE PROGRAM: Access Code: [email or web address] URL: [email or web address] Date: 02/24/2024 Prepared by: Terrilee Few  Exercises - Seated Knee Extension AROM  - 1 x daily - 1-2 sets - 5-10 reps - Seated Ankle Pumps on Table  - 3 x daily - 1 sets - 10 reps - Bent Knee Fallouts  - 1 x daily - 1 sets - 10 reps - 3 hold  ASSESSMENT:  CLINICAL IMPRESSION: Pt with good tolerance for activities today. R hip continues to be very stiff, but minimal pain with ther ex. He has most pain in posterior hip, and has not improved with initial ther ex. He will see sports med next week. Due to several other medical issues in recent past, he has not had assessment of R hip pain. Pt to benefit form continued care.   Eval: Patient presents with primary complaint of  pain in R hip, and weakness in R hip/LE. He has stiffness in bil hips, R>L, with lack of ROM. He has decreased strength in R hip vs L, with feelings of instability with activity. He has decreased gait mechanics, and will benefit from education on use of AD. He has decreased ability and stability with functional activity, gait,  and stairs. He is overall deconditioned and will benefit from education on HEP . Pt with decreased ability for full functional activities. Pt will  benefit from skilled PT to improve deficits and pain and to return to PLOF.   OBJECTIVE IMPAIRMENTS: Abnormal gait, decreased activity tolerance, decreased balance, decreased knowledge of use of DME, decreased mobility, difficulty walking, decreased ROM, decreased strength, decreased safety awareness, impaired flexibility, improper body mechanics, and pain.   ACTIVITY LIMITATIONS: bending, standing, squatting, stairs, transfers, and locomotion level  PARTICIPATION LIMITATIONS: meal prep, cleaning, shopping, and community activity  PERSONAL FACTORS: Age, Past/current experiences, Time since onset of injury/illness/exacerbation, and 1-2 comorbidities: CA, recent surgery, OA  are also affecting patient's functional outcome.   REHAB POTENTIAL: Good  CLINICAL DECISION MAKING: Evolving/moderate complexity  EVALUATION COMPLEXITY: Moderate   GOALS: Goals  reviewed with patient? Yes  SHORT TERM GOALS: Target date: 03/16/2024  Pt to be independent with initial HEP  Goal status: INITIAL  2.  Pt to demo independent transfer from regular chair, without use of UEs.   Goal status: INITIAL    LONG TERM GOALS: Target date: 04/20/2024  Pt to be independent with final HEP  Goal status: INITIAL  2.  Pt to demo improved strength of R hip to at least 4/5 for flex and abd, to improve ability and stability for gait.   Goal status: INITIAL  3.  Pt to report decreased pain to 0-2/10 in R hip with activity.   Goal status: INITIAL  4.  Pt to demo optimal mechanics and safety  for gait and stairs, with LRAD, to improve ability and safety with community activity.   Goal status: INITIAL    PLAN:  PT FREQUENCY: 1-2x/week  PT DURATION: 8 weeks  PLANNED INTERVENTIONS: Therapeutic exercises, Therapeutic activity, Neuromuscular re-education,  Patient/Family education, Self Care, Joint mobilization, Joint manipulation, Stair training, Orthotic/Fit training, DME instructions, Aquatic Therapy, Dry Needling, Electrical stimulation, Cryotherapy, Moist heat, Taping, Ultrasound, Ionotophoresis 4mg /ml Dexamethasone , Manual therapy,  Vasopneumatic device, Traction, Spinal manipulation, Spinal mobilization,Balance training, Gait training,   PLAN FOR NEXT SESSION: stairs - 8 steps split level-    Terrilee Few, PT, DPT 9:05 PM  03/18/24

## 2024-03-18 ENCOUNTER — Encounter: Payer: Self-pay | Admitting: Physical Therapy

## 2024-03-20 ENCOUNTER — Other Ambulatory Visit: Payer: Self-pay

## 2024-03-20 ENCOUNTER — Ambulatory Visit: Admitting: Family Medicine

## 2024-03-20 ENCOUNTER — Encounter: Payer: Self-pay | Admitting: Family Medicine

## 2024-03-20 VITALS — BP 138/76 | HR 84 | Ht 68.0 in | Wt 174.0 lb

## 2024-03-20 DIAGNOSIS — M1611 Unilateral primary osteoarthritis, right hip: Secondary | ICD-10-CM

## 2024-03-20 DIAGNOSIS — G8929 Other chronic pain: Secondary | ICD-10-CM

## 2024-03-20 DIAGNOSIS — M25551 Pain in right hip: Secondary | ICD-10-CM

## 2024-03-20 NOTE — Patient Instructions (Addendum)
 Thank you for coming in today.   Sock assist devise  I've referred you to Orthopedic Surgery for a consultation.  Let us  know if you don't hear from them in one week.

## 2024-03-20 NOTE — Progress Notes (Signed)
   I, Miquel Amen, CMA acting as a scribe for Garlan Juniper, MD.  [name] [name] is a 89 y.o. male who presents to [facility] today for R hip pain x 1 year. Pt locates pain to left hip. Has been going to PT, has been helpful but continues to have pain. Pain radiating into the gluteal region and groin. Ambulating with walker today. Denies sharp shooting pain into the leg. Denies mechanical sx.   Radiates: gluteal region and groin Aggravates: ambulation Treatments tried: PT x4 visits, HEP  Pertinent review of systems: No fevers or chills  Relevant historical information: History of prostate cancer and bladder cancer status post nephrectomy and bladder prostate removal.   Exam:  BP 138/76   Pulse 84   Ht 5\' 8"  (1.727 m)   Wt 174 lb (78.9 kg)   SpO2 100%   BMI 26.46 kg/m  General: Well Developed, well nourished, and in no acute distress.   MSK: Right hip decreased range of motion.  Antalgic gait using a walker to ambulate.    Lab and Radiology Results  CT scan images of right hip visible on CT scan abdomen pelvis dated February 28, 2024 personally and independently interpreted today. Severe hip arthritis right hip with bone cystic change in the acetabulum and femoral head. Await formal radiology review     Assessment and Plan: 89 y.o. male with right hip pain.  This is a chronic ongoing problem that has not improved with a bit of physical therapy.  He does have several very complicating medical problems including history of bladder and prostate cancer.  Additionally he has significant reduced kidney function with a GFR of 14 which is a bit of a complicating factor as well.  From orthopedic standpoint his best option is a hip replacement but I am not sure if he is healthy enough for that to be a good option.  Will go ahead refer to orthopedics now so we can have that discussion.  Happy to perform a steroid injection anytime he would like.  I do not  anticipate is going to provide lasting relief but it may be helpful if surgery is not a good option.   PDMP not reviewed this encounter. Orders Placed This Encounter  Procedures   Ambulatory referral to Orthopedic Surgery    Referral Priority:   Routine    Referral Type:   Surgical    Referral Reason:   Specialty Services Required    Referred to Provider:   Arnie Lao, MD    Requested Specialty:   Orthopedic Surgery    Number of Visits Requested:   1   No orders of the defined types were placed in this encounter.    Discussed warning signs or symptoms. Please see discharge instructions. Patient expresses understanding.   The above documentation has been reviewed and is accurate and complete Garlan Juniper, M.D.

## 2024-03-21 ENCOUNTER — Ambulatory Visit: Admitting: Physical Therapy

## 2024-03-21 ENCOUNTER — Encounter: Payer: Self-pay | Admitting: Physical Therapy

## 2024-03-21 DIAGNOSIS — R2689 Other abnormalities of gait and mobility: Secondary | ICD-10-CM | POA: Diagnosis not present

## 2024-03-21 DIAGNOSIS — M6281 Muscle weakness (generalized): Secondary | ICD-10-CM | POA: Diagnosis not present

## 2024-03-21 DIAGNOSIS — M25551 Pain in right hip: Secondary | ICD-10-CM

## 2024-03-21 NOTE — Therapy (Signed)
 OUTPATIENT PHYSICAL THERAPY LOWER EXTREMITY TREATMENT   Patient Name: [name] [name] MRN: [id number] DOB:[birth year], 89 y.o., male Today's Date: 03/21/2024  END OF SESSION:  PT End of Session - 03/21/24 1011     Visit Number 5    Number of Visits 16    Authorization Type [employer] medicare    PT Start Time 1015    PT Stop Time 1100    PT Time Calculation (min) 45 min    Activity Tolerance Patient tolerated treatment well    Behavior During Therapy WFL for tasks assessed/performed              Past Medical History:  Diagnosis Date   Anemia    Arthritis    lower back bulging disc, hips, knees, thumbs, shoulder   Asthma    as a pre teen   Bladder cancer (HCC) UROLOGIST-  DR MCKENZIE   S/P TURBT 08-16-2017   Bladder wall hemorrhage 09/28/2023   BPH (benign prostatic hyperplasia)    CKD (chronic kidney disease), stage III (HCC)    STAGE 3  B CKD per lov 10-16-2020 dr Cathalene Clipper Seretha Dance kidney on chart   Dyspnea    Fatigue    MIDDLE OF DAY ON OCCASION   Frequency of urination    GERD (gastroesophageal reflux disease)    on protonix    Hematuria    Hematuria    History of colon polyps    HLD (hyperlipidemia)    HOH (hard of hearing)    slightly hoh right worse thanleft   HTN (hypertension)    Hypothyroidism    Incontinence of urine    weras pads all the time   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   Right Buccal Cheek   Pre-diabetes    Recurrent bladder papillary carcinoma (HCC) hx 2012 and 2014-- urologist- dr Claretta Croft   s/p  TURBT 08-16-2017  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   Renal mass 09/02/2022   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 10/02/2023   Sleep apnea    uses cpap set on 20 /4   Trigger finger of left hand 07/04/2021   last few months per pt   UTI (urinary tract infection) 08/20/2020   Weakness of extremity    left knee   Wears glasses    Past Surgical History:  Procedure Laterality Date    BCG X 2     AFTER LAST 2 BLADDER TUMOR REMOVALS   CATARACT EXTRACTION, BILATERAL     COLONSCOPY  YRS AGO   CYSTOSCOPY N/A 10/29/2020   Procedure: CYSTOSCOPY;  Surgeon: Roxane Copp, MD;  Location: [facility] [place];  Service: Urology;  Laterality: N/A;   CYSTOSCOPY N/A 07/08/2021   Procedure: CYSTOSCOPY;  Surgeon: Roxane Copp, MD;  Location: [facility];  Service: Urology;  Laterality: N/A;   CYSTOSCOPY W/ RETROGRADES Right 07/21/2023   Procedure: CYSTOSCOPY WITH RIGHT RETROGRADE PYELOGRAM;  Surgeon: Melody Spurling., MD;  Location: [facility];  Service: Urology;  Laterality: Right;   CYSTOSCOPY W/ URETERAL STENT PLACEMENT Bilateral 08/16/2017   Procedure: CYSTOSCOPY WITH RETROGRADE PYELOGRAM/URETERAL STENT PLACEMENT;  Surgeon: Marco Severs, MD;  Location: [facility];  Service: Urology;  Laterality: Bilateral;   CYSTOSCOPY W/ URETERAL STENT PLACEMENT Left 09/02/2022   Procedure: CYSTOSCOPY WITH RETROGRADE PYELOGRAM, URETERAL STENT PLACEMENT;  Surgeon: Osborn Blaze, MD;  Location: [facility];  Service: Urology;  Laterality: Left;   CYSTOSCOPY WITH BIOPSY N/A 06/25/2020  Procedure: CYSTOSCOPY FULGURATION   BLADDER  BIOPSY;  Surgeon: Roxane Copp, MD;  Location: [facility] ;  Service: Urology;  Laterality: N/A;   CYSTOSCOPY WITH FULGERATION N/A 12/08/2019   Procedure: CYSTOSCOPY WITH FULGERATION;  Surgeon: Marco Severs, MD;  Location: [facility];  Service: Urology;  Laterality: N/A;   CYSTOSCOPY WITH FULGERATION N/A 03/24/2022   Procedure: CYSTOSCOPY WITH FULGERATION;  Surgeon: Roxane Copp, MD;  Location: [facility];  Service: Urology;  Laterality: N/A;   CYSTOSCOPY WITH RETROGRADE PYELOGRAM, URETEROSCOPY AND STENT PLACEMENT Left 03/24/2022   Procedure: CYSTOSCOPY WITH RETROGRADE PYELOGRAM, DIAGNOSTIC URETEROSCOPY WITH BIOPSY AND STENT PLACEMENT;  Surgeon: Roxane Copp, MD;  Location: [facility];   Service: Urology;  Laterality: Left;  90 MINUTES   CYSTOSCOPY WITH RETROGRADE PYELOGRAM, URETEROSCOPY AND STENT PLACEMENT Left 04/07/2022   Procedure: CYSTOSCOPY WITH  RETROGRADE PYELOGRAM, DIAGNOSTIC URETEROSCOPY WITH BIOPSY AND STENT EXCHANGE;  Surgeon: Roxane Copp, MD;  Location: [facility];  Service: Urology;  Laterality: Left;  75 MINUTES   CYSTOSCOPY WITH RETROGRADE PYELOGRAM, URETEROSCOPY AND STENT PLACEMENT Left 08/11/2022   Procedure: CYSTOSCOPY WITH RETROGRADE PYELOGRAM, ANTEGRADE NEPHROSTOGRAM, DIAGNOSTIC  URETEROSCOPY;  Surgeon: Roxane Copp, MD;  Location: [facility];  Service: Urology;  Laterality: Left;  90 MINS   HOLMIUM LASER APPLICATION Left 08/11/2022   Procedure: HOLMIUM LASER APPLICATION;  Surgeon: Roxane Copp, MD;  Location: [facility];  Service: Urology;  Laterality: Left;   IR NEPHROSTOMY EXCHANGE LEFT  05/15/2022   IR NEPHROSTOMY EXCHANGE LEFT  07/10/2022   IR NEPHROSTOMY PLACEMENT LEFT  05/06/2022   KNEE ARTHROSCOPY Left 06/10/2011   LYMPH NODE DISSECTION Left 09/02/2022   Procedure: RETROPERITONEAL LYMPH NODE DISSECTION;  Surgeon: Osborn Blaze, MD;  Location: [facility];  Service: Urology;  Laterality: Left;   PROSTATE BIOPSY  2000   ROBOT ASSITED LAPAROSCOPIC NEPHROURETERECTOMY Left 09/02/2022   Procedure: XI ROBOT ASSITED LAPAROSCOPIC NEPHROURETERECTOMY;  Surgeon: Osborn Blaze, MD;  Location: [facility];  Service: Urology;  Laterality: Left;   ROBOT LAP RADICAL CYSTOPROSTATECTOMY PELVIC LYMPHADENECTOMY, NEOBLADDER N/A 02/04/2024   Procedure: ROBOT ASSISTED LAPAROSCOPIC RADICAL CYSTOPROSTATECTOMY;  Surgeon: Melody Spurling., MD;  Location: [facility];  Service: Urology;  Laterality: N/A;  300 MINUTES NEEDED FOR CASE   TRANSURETHRAL RESECTION OF BLADDER TUMOR N/A 08/16/2017   Procedure: TRANSURETHRAL RESECTION OF BLADDER TUMOR (TURBT);  Surgeon: Marco Severs, MD;  Location: [facility];  Service: Urology;  Laterality: N/A;   TRANSURETHRAL RESECTION OF  BLADDER TUMOR  12-09-2012   dr Lavona Pounds [facility]   and TURP   TRANSURETHRAL RESECTION OF BLADDER TUMOR N/A 09/20/2017   Procedure: TRANSURETHRAL RESECTION OF BLADDER TUMOR (TURBT);  Surgeon: Marco Severs, MD;  Location[facility];  Service: Urology;  Laterality: N/A;   TRANSURETHRAL RESECTION OF BLADDER TUMOR N/A 12/08/2019   Procedure: TRANSURETHRAL RESECTION OF BLADDER TUMOR;  Surgeon: Marco Severs, MD;  Location[facility];  Service: Urology;  Laterality: N/A;  1 HR   TRANSURETHRAL RESECTION OF BLADDER TUMOR N/A 10/29/2020   Procedure: BLADDER FULGERATION;  Surgeon: Roxane Copp, MD;  Location: [facility];  Service: Urology;  Laterality: N/A;  30 MINS   TRANSURETHRAL RESECTION OF BLADDER TUMOR N/A 07/08/2021   Procedure: TRANSURETHRAL RESECTION OF BLADDER TUMOR (TURBT)/FULGERATION;  Surgeon: Roxane Copp, MD;  Location: [facility];  Service: Urology;  Laterality: N/A;  45 MINS   TRANSURETHRAL RESECTION OF BLADDER TUMOR N/A  12/02/2021   Procedure: TRANSURETHRAL RESECTION OF BLADDER TUMOR (TURBT);  Surgeon: Roxane Copp, MD;  Location: [facility];  Service: Urology;  Laterality: N/A;   TRANSURETHRAL RESECTION OF BLADDER TUMOR N/A 07/21/2023   Procedure: TRANSURETHRAL RESECTION OF BLADDER TUMOR (TURBT);  Surgeon: Melody Spurling., MD;  Location: [facility];  Service: Urology;  Laterality: N/A;  60 MINUTES   TRANSURETHRAL RESECTION OF BLADDER TUMOR N/A 10/01/2023   Procedure: TRANSURETHRAL RESECTION OF BLADDER TUMOR (TURBT)/CLOT EVACUATION WITH FULGERATION;  Surgeon: Melody Spurling., MD;  Location: [facility];  Service: Urology;  Laterality: N/A;   TRANSURETHRAL RESECTION OF PROSTATE  2011   UPPER GI ENDOSCOPY  YRS AGO   URETEROSCOPY Right 02/04/2024   Procedure: RIGHT CUTANEOUS URETEROSCOPY;  Surgeon: Melody Spurling., MD;  Location: [facility];  Service: Urology;  Laterality: Right;   Patient Active  Problem List   Diagnosis Date Noted   Chronic right hip pain 03/20/2024   Prostatic adenocarcinoma (HCC) 03/03/2024   UTI (urinary tract infection) 03/02/2024   Complicated UTI (urinary tract infection) 03/02/2024   Ureterostomy status (HCC) 02/15/2024   History of iron deficiency 12/20/2023   Dysuria 10/15/2023   AKI (acute kidney injury) (HCC) 10/08/2023   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 10/02/2023   Decreased appetite 09/28/2023   Hardening of the aorta (main artery of the heart) (HCC) 09/28/2023   Mixed hyperlipidemia 09/28/2023   Prediabetes 09/28/2023   Panic attack 09/28/2023   Pedal edema 09/28/2023   Osteoarthritis 09/28/2023   Sinusitis chronic, sphenoidal 09/28/2023   Acute blood loss anemia (ABLA) 09/28/2023   Hypothyroidism 09/28/2023   Low serum vitamin B12 09/10/2023   Anemia of chronic disease 09/10/2023   Normocytic anemia 08/06/2023   Unintentional weight loss 04/09/2021   Leg weakness, bilateral 04/09/2021   Surgical counseling visit 04/09/2021   Essential hypertension 05/23/2020   Sleep apnea treated with nocturnal BiPAP 01/25/2020   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   Hypersomnia with sleep apnea 09/15/2019   Cardiac arrhythmia due to premature depolarization 09/15/2019   Excessive daytime sleepiness 09/15/2019   CKD (chronic kidney disease) stage 4, GFR 15-29 ml/min (HCC) - Baseline Scr 3.0 09/15/2019   Malignant neoplasm of urinary bladder (HCC) 09/15/2019   Gross hematuria 09/11/2017   Benign prostatic hyperplasia without lower urinary tract symptoms 12/09/2012   GERD (gastroesophageal reflux disease) 11/28/2012     PCP: Virgle Grime  REFERRING PROVIDER: Virgle Grime  REFERRING DIAG: Leg weakness   THERAPY DIAG:  Pain in right hip  Muscle weakness (generalized)  Other abnormalities of gait and mobility  Rationale for Evaluation and Treatment: Rehabilitation  ONSET DATE:    SUBJECTIVE:    SUBJECTIVE STATEMENT:  Pt saw sports med, also has appt to see Ortho 5/21. R hip still feeling painful, and he is using walker. States achey yesterday.   Eval:  Pt states weakness in legs R>L ongoing for a while. He had significant weakness previously from medication issues but has slightly improved. He states pain in his R hip, sharp pains with twisting at times, and instability feeling with walking and activity due to hip pain.He has had previous L knee surgery, states R hip OA, and has had recent surgery for cancer, with bladder and prostate removal. Since then he states feeling pretty good, he does have swelling in feet and legs, just saw Dr this week who was not concerned. He is wearing sandals today, with obvious swelling in feet and legs.  He  used to walk about 2 mi/day in past years, recently he did walk about 1.5 miles States minimal fatigue with endurance, but more limitation from R hip and leg weakness.  He is currently living with son and his wife in split level house. States doing ok with stairs for now. Has walking stick, but not using today, does usually use most of the time.    PERTINENT HISTORY: Recent bladder and prostate removal for CA, CKD,  Nephrectomy (L),    PAIN:  Are you having pain? Yes: NPRS scale: 5/10    Pain location: R hip/anterior/lateral Pain description: sore, stiff, shooting with twisting motion Aggravating factors: increased activity , initial standing  Relieving factors: none stated   PRECAUTIONS: None  WEIGHT BEARING RESTRICTIONS: No  FALLS:  Has patient fallen in last 6 months? No  PLOF: Independent  PATIENT GOALS:  Decreased pain in hip, improve walking and balance.   NEXT MD VISIT:   OBJECTIVE:   DIAGNOSTIC FINDINGS:   PATIENT SURVEYS:    COGNITION: Overall cognitive status: Within functional limits for tasks assessed     SENSATION: WFL  EDEMA: mod/significant edema in bil lower legs and feet.    POSTURE:    No Significant  postural limitations  PALPATION:     LOWER EXTREMITY ROM: Hips:  L: mild limitation for flex, ER, mod for IR,   R:  mod limitation for all motions.   LOWER EXTREMITY MMT:  MMT Left eval Right  eval  Hip flexion 4- 3-  Hip extension    Hip abduction    Hip adduction    Hip internal rotation    Hip external rotation    Knee flexion 4 4  Knee extension 4+ 4+  Ankle dorsiflexion    Ankle plantarflexion    Ankle inversion    Ankle eversion     (Blank rows = not tested)  LOWER EXTREMITY SPECIAL TESTS:    FUNCTIONAL TESTS:    GAIT: Distance walked: 185ft Assistive device utilized: None Level of assistance: Complete Independence Comments: antalgic gait for R hip, decreased hip extension on R. , decreased stability   TODAY'S TREATMENT:                                                                                                                              DATE:   03/21/24: Therapeutic Exercise: Aerobic: Supine:  SLR x 10 bil; Marching x15 (for shorter lever arm) Seated:   LAQ 2 x 10 bil; Ankle pumps and circles- review for HEP Standing: Heel raises x 15; Toe taps on 6 in step x 10;  Stretches:    LTR x 15;  Neuromuscular Re-education: Manual Therapy:  Therapeutic Activity: sit to stand 2 x  at higher mat table, no UE support, with education on body mechanics Stairs: up/down 5 steps with 2 hands on 1 rail to mimic home environment, step to pattern, cueing and education on safety and mechanics,  x 4  Self Care:   Gait:     03/16/2024 Therapeutic Exercise: Aerobic: Supine: hip ER  ROM x 10 3 sec holds;  SKTC 15 sec x 5 on R with towel ;   PROM for R hip, flex, IR, ER,  Clams GTB x 20;  Seated:    Aaron Aas  Standing: Stretches:    LTR x 15;  Neuromuscular Re-education: Manual Therapy:  STM/tennis ball to R low lumbar paraspinals and R glute med, piriformis Therapeutic Activity: sit to stand 2 x 5 at higher mat table, with education on body mechanics Stairs: up/down 5  steps with 2 hands on 1 rail to mimic home environment, step to pattern, cueing and education on safety and mechanics,  x 4  Self Care:   Gait:     Therapeutic Exercise: Aerobic: Supine: hip ER and IR  ROM x 10 3 sec holds; SKTC 15 sec x 5 on R with towel - education for HEP Seated:  LAQ 2 lb 2 x 10 bil ;   Ankle pumps 2 x 10 bil;   sit to stand 2 x 5 at higher mat table, with education on body mechanics .  Standing: Stretches:   Neuromuscular Re-education: Manual Therapy: Therapeutic Activity: Self Care: Gait: ambulation with SPC with education on use and sequencing, 45 ft x 6,  with his walking stick x 4 Discussed using cane or walker at all times.    Previous:  Therapeutic Exercise: Aerobic: Supine: hip ER ROM x 10 3 sec holds;  Seated: LAQ x 10 bil ;   Ankle pumps 2 x 10 bil;   sit to stand x 5 at higher mat table, with education on body mechanics .  Standing: Stretches:   Neuromuscular Re-education: Manual Therapy: Therapeutic Activity: Self Care:    PATIENT EDUCATION:  Education details: PT POC, Exam findings, HEP Person educated: Patient Education method: Explanation, Demonstration, Tactile cues, Verbal cues, and Handouts Education comprehension: verbalized understanding, returned demonstration, verbal cues required, tactile cues required, and needs further education   HOME EXERCISE PROGRAM: Access Code: [email or web address] URL: [email or web address] Date: 02/24/2024 Prepared by: Terrilee Few  Exercises - Seated Knee Extension AROM  - 1 x daily - 1-2 sets - 5-10 reps - Seated Ankle Pumps on Table  - 3 x daily - 1 sets - 10 reps - Bent Knee Fallouts  - 1 x daily - 1 sets - 10 reps - 3 hold  ASSESSMENT:  CLINICAL IMPRESSION: Pt with good tolerance for activities today. Progressed strength as tolerated. Pt does have hip clunk with LAQ today. He  will benefit from continued strengthening and practice on stairs for improved stability and safety.   Eval:  Patient presents with primary complaint of  pain in R hip, and weakness in R hip/LE. He has stiffness in bil hips, R>L, with lack of ROM. He has decreased strength in R hip vs L, with feelings of instability with activity. He has decreased gait mechanics, and will benefit from education on use of AD. He has decreased ability and stability with functional activity, gait, and stairs. He is overall deconditioned and will benefit from education on HEP . Pt with decreased ability for full functional activities. Pt will  benefit from skilled PT to improve deficits and pain and to return to PLOF.   OBJECTIVE IMPAIRMENTS: Abnormal gait, decreased activity tolerance, decreased balance, decreased knowledge of use of DME, decreased mobility, difficulty walking, decreased ROM, decreased strength, decreased safety awareness, impaired flexibility, improper body mechanics, and pain.  ACTIVITY LIMITATIONS: bending, standing, squatting, stairs, transfers, and locomotion level  PARTICIPATION LIMITATIONS: meal prep, cleaning, shopping, and community activity  PERSONAL FACTORS: Age, Past/current experiences, Time since onset of injury/illness/exacerbation, and 1-2 comorbidities: CA, recent surgery, OA  are also affecting patient's functional outcome.   REHAB POTENTIAL: Good  CLINICAL DECISION MAKING: Evolving/moderate complexity  EVALUATION COMPLEXITY: Moderate   GOALS: Goals reviewed with patient? Yes  SHORT TERM GOALS: Target date: 03/16/2024  Pt to be independent with initial HEP  Goal status: INITIAL  2.  Pt to demo independent transfer from regular chair, without use of UEs.   Goal status: INITIAL    LONG TERM GOALS: Target date: 04/20/2024  Pt to be independent with final HEP  Goal status: INITIAL  2.  Pt to demo improved strength of R hip to at least 4/5 for flex and abd, to improve ability and stability for gait.   Goal status: INITIAL  3.  Pt to report decreased pain to 0-2/10 in R hip  with activity.   Goal status: INITIAL  4.  Pt to demo optimal mechanics and safety  for gait and stairs, with LRAD, to improve ability and safety with community activity.   Goal status: INITIAL    PLAN:  PT FREQUENCY: 1-2x/week  PT DURATION: 8 weeks  PLANNED INTERVENTIONS: Therapeutic exercises, Therapeutic activity, Neuromuscular re-education, Patient/Family education, Self Care, Joint mobilization, Joint manipulation, Stair training, Orthotic/Fit training, DME instructions, Aquatic Therapy, Dry Needling, Electrical stimulation, Cryotherapy, Moist heat, Taping, Ultrasound, Ionotophoresis 4mg /ml Dexamethasone , Manual therapy,  Vasopneumatic device, Traction, Spinal manipulation, Spinal mobilization,Balance training, Gait training,   PLAN FOR NEXT SESSION: stairs - 8 steps split level-    Terrilee Few, PT, DPT 9:34 PM  03/21/24

## 2024-03-22 DIAGNOSIS — I129 Hypertensive chronic kidney disease with stage 1 through stage 4 chronic kidney disease, or unspecified chronic kidney disease: Secondary | ICD-10-CM | POA: Diagnosis not present

## 2024-03-24 ENCOUNTER — Ambulatory Visit: Admitting: Physical Therapy

## 2024-03-24 ENCOUNTER — Encounter: Payer: Self-pay | Admitting: Physical Therapy

## 2024-03-24 DIAGNOSIS — M6281 Muscle weakness (generalized): Secondary | ICD-10-CM | POA: Diagnosis not present

## 2024-03-24 DIAGNOSIS — M25551 Pain in right hip: Secondary | ICD-10-CM

## 2024-03-24 DIAGNOSIS — R2689 Other abnormalities of gait and mobility: Secondary | ICD-10-CM | POA: Diagnosis not present

## 2024-03-24 NOTE — Therapy (Signed)
 OUTPATIENT PHYSICAL THERAPY LOWER EXTREMITY TREATMENT   Patient Name: [name] [name] MRN: [id number] DOB:[birth year], 89 y.o., male Today's Date: 03/24/2024   END OF SESSION:  PT End of Session - 03/24/24 1145     Visit Number 6    Number of Visits 16    Date for PT Re-Evaluation 04/20/24    Authorization Type [employer] medicare    PT Start Time 1147    PT Stop Time 1229    PT Time Calculation (min) 42 min    Activity Tolerance Patient tolerated treatment well    Behavior During Therapy WFL for tasks assessed/performed              Past Medical History:  Diagnosis Date   Anemia    Arthritis    lower back bulging disc, hips, knees, thumbs, shoulder   Asthma    as a pre teen   Bladder cancer (HCC) UROLOGIST-  DR MCKENZIE   S/P TURBT 08-16-2017   Bladder wall hemorrhage 09/28/2023   BPH (benign prostatic hyperplasia)    CKD (chronic kidney disease), stage III (HCC)    STAGE 3  B CKD per lov 10-16-2020 dr Cathalene Clipper Seretha Dance kidney on chart   Dyspnea    Fatigue    MIDDLE OF DAY ON OCCASION   Frequency of urination    GERD (gastroesophageal reflux disease)    on protonix    Hematuria    Hematuria    History of colon polyps    HLD (hyperlipidemia)    HOH (hard of hearing)    slightly hoh right worse thanleft   HTN (hypertension)    Hypothyroidism    Incontinence of urine    weras pads all the time   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   Right Buccal Cheek   Pre-diabetes    Recurrent bladder papillary carcinoma (HCC) hx 2012 and 2014-- urologist- dr Claretta Croft   s/p  TURBT 08-16-2017  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   Renal mass 09/02/2022   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 10/02/2023   Sleep apnea    uses cpap set on 20 /4   Trigger finger of left hand 07/04/2021   last few months per pt   UTI (urinary tract infection) 08/20/2020   Weakness of extremity    left knee   Wears glasses    Past Surgical  History:  Procedure Laterality Date   BCG X 2     AFTER LAST 2 BLADDER TUMOR REMOVALS   CATARACT EXTRACTION, BILATERAL     COLONSCOPY  YRS AGO   CYSTOSCOPY N/A 10/29/2020   Procedure: CYSTOSCOPY;  Surgeon: Roxane Copp, MD;  Location: [facility];  Service: Urology;  Laterality: N/A;   CYSTOSCOPY N/A 07/08/2021   Procedure: CYSTOSCOPY;  Surgeon: Roxane Copp, MD;  Location: [facility];  Service: Urology;  Laterality: N/A;   CYSTOSCOPY W/ RETROGRADES Right 07/21/2023   Procedure: CYSTOSCOPY WITH RIGHT RETROGRADE PYELOGRAM;  Surgeon: Melody Spurling., MD;  Location: [facility];  Service: Urology;  Laterality: Right;   CYSTOSCOPY W/ URETERAL STENT PLACEMENT Bilateral 08/16/2017   Procedure: CYSTOSCOPY WITH RETROGRADE PYELOGRAM/URETERAL STENT PLACEMENT;  Surgeon: Marco Severs, MD;  Location[facility];  Service: Urology;  Laterality: Bilateral;   CYSTOSCOPY W/ URETERAL STENT PLACEMENT Left 09/02/2022   Procedure: CYSTOSCOPY WITH RETROGRADE PYELOGRAM, URETERAL STENT PLACEMENT;  Surgeon: Osborn Blaze, MD;  Location: [facility];  Service: Urology;  Laterality: Left;  CYSTOSCOPY WITH BIOPSY N/A 06/25/2020   Procedure: CYSTOSCOPY FULGURATION   BLADDER  BIOPSY;  Surgeon: Roxane Copp, MD;  Location: [facility];  Service: Urology;  Laterality: N/A;   CYSTOSCOPY WITH FULGERATION N/A 12/08/2019   Procedure: CYSTOSCOPY WITH FULGERATION;  Surgeon: Marco Severs, MD;  Location: [facility];  Service: Urology;  Laterality: N/A;   CYSTOSCOPY WITH FULGERATION N/A 03/24/2022   Procedure: CYSTOSCOPY WITH FULGERATION;  Surgeon: Roxane Copp, MD;  Location: [facility];  Service: Urology;  Laterality: N/A;   CYSTOSCOPY WITH RETROGRADE PYELOGRAM, URETEROSCOPY AND STENT PLACEMENT Left 03/24/2022   Procedure: CYSTOSCOPY WITH RETROGRADE PYELOGRAM, DIAGNOSTIC URETEROSCOPY WITH BIOPSY AND STENT PLACEMENT;  Surgeon: Roxane Copp, MD;  Location: [facility];  Service: Urology;  Laterality: Left;  90 MINUTES   CYSTOSCOPY WITH RETROGRADE PYELOGRAM, URETEROSCOPY AND STENT PLACEMENT Left 04/07/2022   Procedure: CYSTOSCOPY WITH  RETROGRADE PYELOGRAM, DIAGNOSTIC URETEROSCOPY WITH BIOPSY AND STENT EXCHANGE;  Surgeon: Roxane Copp, MD;  Location: [facility];  Service: Urology;  Laterality: Left;  75 MINUTES   CYSTOSCOPY WITH RETROGRADE PYELOGRAM, URETEROSCOPY AND STENT PLACEMENT Left 08/11/2022   Procedure: CYSTOSCOPY WITH RETROGRADE PYELOGRAM, ANTEGRADE NEPHROSTOGRAM, DIAGNOSTIC  URETEROSCOPY;  Surgeon: Roxane Copp, MD;  Location: [facility];  Service: Urology;  Laterality: Left;  90 MINS   HOLMIUM LASER APPLICATION Left 08/11/2022   Procedure: HOLMIUM LASER APPLICATION;  Surgeon: Roxane Copp, MD;  Location: [facility];  Service: Urology;  Laterality: Left;   IR NEPHROSTOMY EXCHANGE LEFT  05/15/2022   IR NEPHROSTOMY EXCHANGE LEFT  07/10/2022   IR NEPHROSTOMY PLACEMENT LEFT  05/06/2022   KNEE ARTHROSCOPY Left 06/10/2011   LYMPH NODE DISSECTION Left 09/02/2022   Procedure: RETROPERITONEAL LYMPH NODE DISSECTION;  Surgeon: Osborn Blaze, MD;  Location: [facility];  Service: Urology;  Laterality: Left;   PROSTATE BIOPSY  2000   ROBOT ASSITED LAPAROSCOPIC NEPHROURETERECTOMY Left 09/02/2022   Procedure: XI ROBOT ASSITED LAPAROSCOPIC NEPHROURETERECTOMY;  Surgeon: Osborn Blaze, MD;  Location: [facility];  Service: Urology;  Laterality: Left;   ROBOT LAP RADICAL CYSTOPROSTATECTOMY PELVIC LYMPHADENECTOMY, NEOBLADDER N/A 02/04/2024   Procedure: ROBOT ASSISTED LAPAROSCOPIC RADICAL CYSTOPROSTATECTOMY;  Surgeon: Melody Spurling., MD;  Location: [facility];  Service: Urology;  Laterality: N/A;  300 MINUTES NEEDED FOR CASE   TRANSURETHRAL RESECTION OF BLADDER TUMOR N/A 08/16/2017   Procedure: TRANSURETHRAL RESECTION OF BLADDER TUMOR (TURBT);  Surgeon: Marco Severs, MD;  Location: [facility];  Service: Urology;   Laterality: N/A;   TRANSURETHRAL RESECTION OF BLADDER TUMOR  12-09-2012   dr Lavona Pounds [facility]   and TURP   TRANSURETHRAL RESECTION OF BLADDER TUMOR N/A 09/20/2017   Procedure: TRANSURETHRAL RESECTION OF BLADDER TUMOR (TURBT);  Surgeon: Marco Severs, MD;  Location: [facility];  Service: Urology;  Laterality: N/A;   TRANSURETHRAL RESECTION OF BLADDER TUMOR N/A 12/08/2019   Procedure: TRANSURETHRAL RESECTION OF BLADDER TUMOR;  Surgeon: Marco Severs, MD;  Location: [facility];  Service: Urology;  Laterality: N/A;  1 HR   TRANSURETHRAL RESECTION OF BLADDER TUMOR N/A 10/29/2020   Procedure: BLADDER FULGERATION;  Surgeon: Roxane Copp, MD;  Location: [facility];  Service: Urology;  Laterality: N/A;  30 MINS   TRANSURETHRAL RESECTION OF BLADDER TUMOR N/A 07/08/2021   Procedure: TRANSURETHRAL RESECTION OF BLADDER TUMOR (TURBT)/FULGERATION;  Surgeon: Roxane Copp, MD;  Location: [facility];  Service: Urology;  Laterality: N/A;  45 MINS  TRANSURETHRAL RESECTION OF BLADDER TUMOR N/A 12/02/2021   Procedure: TRANSURETHRAL RESECTION OF BLADDER TUMOR (TURBT);  Surgeon: Roxane Copp, MD;  Location: [facility];  Service: Urology;  Laterality: N/A;   TRANSURETHRAL RESECTION OF BLADDER TUMOR N/A 07/21/2023   Procedure: TRANSURETHRAL RESECTION OF BLADDER TUMOR (TURBT);  Surgeon: Melody Spurling., MD;  Location: [facility];  Service: Urology;  Laterality: N/A;  60 MINUTES   TRANSURETHRAL RESECTION OF BLADDER TUMOR N/A 10/01/2023   Procedure: TRANSURETHRAL RESECTION OF BLADDER TUMOR (TURBT)/CLOT EVACUATION WITH FULGERATION;  Surgeon: Melody Spurling., MD;  Location: [facility];  Service: Urology;  Laterality: N/A;   TRANSURETHRAL RESECTION OF PROSTATE  2011   UPPER GI ENDOSCOPY  YRS AGO   URETEROSCOPY Right 02/04/2024   Procedure: RIGHT CUTANEOUS URETEROSCOPY;  Surgeon: Melody Spurling., MD;  Location: [facility];  Service:  Urology;  Laterality: Right;   Patient Active Problem List   Diagnosis Date Noted   Chronic right hip pain 03/20/2024   Prostatic adenocarcinoma (HCC) 03/03/2024   UTI (urinary tract infection) 03/02/2024   Complicated UTI (urinary tract infection) 03/02/2024   Ureterostomy status (HCC) 02/15/2024   History of iron deficiency 12/20/2023   Dysuria 10/15/2023   AKI (acute kidney injury) (HCC) 10/08/2023   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 10/02/2023   Decreased appetite 09/28/2023   Hardening of the aorta (main artery of the heart) (HCC) 09/28/2023   Mixed hyperlipidemia 09/28/2023   Prediabetes 09/28/2023   Panic attack 09/28/2023   Pedal edema 09/28/2023   Osteoarthritis 09/28/2023   Sinusitis chronic, sphenoidal 09/28/2023   Acute blood loss anemia (ABLA) 09/28/2023   Hypothyroidism 09/28/2023   Low serum vitamin B12 09/10/2023   Anemia of chronic disease 09/10/2023   Normocytic anemia 08/06/2023   Unintentional weight loss 04/09/2021   Leg weakness, bilateral 04/09/2021   Surgical counseling visit 04/09/2021   Essential hypertension 05/23/2020   Sleep apnea treated with nocturnal BiPAP 01/25/2020   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   Hypersomnia with sleep apnea 09/15/2019   Cardiac arrhythmia due to premature depolarization 09/15/2019   Excessive daytime sleepiness 09/15/2019   CKD (chronic kidney disease) stage 4, GFR 15-29 ml/min (HCC) - Baseline Scr 3.0 09/15/2019   Malignant neoplasm of urinary bladder (HCC) 09/15/2019   Gross hematuria 09/11/2017   Benign prostatic hyperplasia without lower urinary tract symptoms 12/09/2012   GERD (gastroesophageal reflux disease) 11/28/2012     PCP: Virgle Grime  REFERRING PROVIDER: Virgle Grime  REFERRING DIAG: Leg weakness   THERAPY DIAG:  Pain in right hip  Muscle weakness (generalized)  Other abnormalities of gait and mobility  Rationale for Evaluation and Treatment:  Rehabilitation  ONSET DATE:    SUBJECTIVE:   SUBJECTIVE STATEMENT:  Pt states achey all over today, in other hip and knees as well. Feet less swollen today. Feels walking has been hard, due to stiffness in hip and knees. Using walker consistently.   Eval:  Pt states weakness in legs R>L ongoing for a while. He had significant weakness previously from medication issues but has slightly improved. He states pain in his R hip, sharp pains with twisting at times, and instability feeling with walking and activity due to hip pain.He has had previous L knee surgery, states R hip OA, and has had recent surgery for cancer, with bladder and prostate removal. Since then he states feeling pretty good, he does have swelling in feet and legs, just saw Dr this week who was not concerned.  He is wearing sandals today, with obvious swelling in feet and legs.  He used to walk about 2 mi/day in past years, recently he did walk about 1.5 miles States minimal fatigue with endurance, but more limitation from R hip and leg weakness.  He is currently living with son and his wife in split level house. States doing ok with stairs for now. Has walking stick, but not using today, does usually use most of the time.    PERTINENT HISTORY: Recent bladder and prostate removal for CA, CKD,  Nephrectomy (L),    PAIN:  Are you having pain? Yes: NPRS scale: 5-6/10    Pain location: R hip/anterior/lateral Pain description: sore, stiff, shooting with twisting motion Aggravating factors: increased activity , initial standing  Relieving factors: none stated   PRECAUTIONS: None  WEIGHT BEARING RESTRICTIONS: No  FALLS:  Has patient fallen in last 6 months? No  PLOF: Independent  PATIENT GOALS:  Decreased pain in hip, improve walking and balance.   NEXT MD VISIT:   OBJECTIVE:   DIAGNOSTIC FINDINGS:   PATIENT SURVEYS:    COGNITION: Overall cognitive status: Within functional limits for tasks  assessed     SENSATION: WFL  EDEMA: mod/significant edema in bil lower legs and feet.    POSTURE:    No Significant postural limitations  PALPATION:     LOWER EXTREMITY ROM: Hips:  L: mild limitation for flex, ER, mod for IR,   R:  mod limitation for all motions.   LOWER EXTREMITY MMT:  MMT Left eval Right  eval Left:  5/2 Right 5/2  Hip flexion 4- 3- 4 3 /3-  Hip extension      Hip abduction      Hip adduction      Hip internal rotation      Hip external rotation      Knee flexion 4 4    Knee extension 4+ 4+    Ankle dorsiflexion      Ankle plantarflexion      Ankle inversion      Ankle eversion       (Blank rows = not tested)  LOWER EXTREMITY SPECIAL TESTS:    FUNCTIONAL TESTS:    GAIT: Distance walked: 244ft Assistive device utilized: Walker - 2 wheeled Level of assistance: Complete Independence and Modified independence Comments: decreased step height when R hip painful.    TODAY'S TREATMENT:                                                                                                                              DATE:   03/24/24: Therapeutic Exercise: Aerobic: Supine:  Marching x15 (for shorter lever arm);  hip ER rom x 10;  heel slides x 12 on R;  SAQ 2 x 10 bil;   Seated:    Standing:  Stretches:    LTR x 15; prom for bil hip flex, IR, ER.  Neuromuscular Re-education: Manual Therapy:  Therapeutic  Activity: Stairs: up/down 5 steps with 2 hands , step to pattern, cueing and education on safety and mechanics,  x 4  Self Care:   Gait:  ambulation with RW, cuing for step height, and increased ankle and knee flexion . 45 ft x 8     03/21/24: Therapeutic Exercise: Aerobic: Supine:  SLR x 10 bil; Marching x15 (for shorter lever arm) Seated:   LAQ 2 x 10 bil; Ankle pumps and circles- review for HEP Standing: Heel raises x 15; Toe taps on 6 in step x 10;  Stretches:    LTR x 15;  Neuromuscular Re-education: Manual Therapy:  Therapeutic  Activity: sit to stand 2 x  at higher mat table, no UE support, with education on body mechanics Stairs: up/down 5 steps with 2 hands on 1 rail to mimic home environment, step to pattern, cueing and education on safety and mechanics,  x 4  Self Care:   Gait:     PATIENT EDUCATION:  Education details: updated and reviewed HEP Person educated: Patient Education method: Explanation, Demonstration, Tactile cues, Verbal cues, and Handouts Education comprehension: verbalized understanding, returned demonstration, verbal cues required, tactile cues required, and needs further education   HOME EXERCISE PROGRAM: Access Code: [email or web address]  ASSESSMENT:  CLINICAL IMPRESSION: Pt with good tolerance for activities today. He has improved gait mechanics with use of walker, and today after doing supine ther ex first to loosen up hip. He does require cueing for knee and ankle mobility with swing. He is limited with strength and stability due to pain and instability in R hip with standing activity, and will benefit from continued care for improving safety with functional activity.   Eval: Patient presents with primary complaint of  pain in R hip, and weakness in R hip/LE. He has stiffness in bil hips, R>L, with lack of ROM. He has decreased strength in R hip vs L, with feelings of instability with activity. He has decreased gait mechanics, and will benefit from education on use of AD. He has decreased ability and stability with functional activity, gait, and stairs. He is overall deconditioned and will benefit from education on HEP . Pt with decreased ability for full functional activities. Pt will  benefit from skilled PT to improve deficits and pain and to return to PLOF.   OBJECTIVE IMPAIRMENTS: Abnormal gait, decreased activity tolerance, decreased balance, decreased knowledge of use of DME, decreased mobility, difficulty walking, decreased ROM, decreased strength, decreased safety awareness, impaired  flexibility, improper body mechanics, and pain.   ACTIVITY LIMITATIONS: bending, standing, squatting, stairs, transfers, and locomotion level  PARTICIPATION LIMITATIONS: meal prep, cleaning, shopping, and community activity  PERSONAL FACTORS: Age, Past/current experiences, Time since onset of injury/illness/exacerbation, and 1-2 comorbidities: CA, recent surgery, OA  are also affecting patient's functional outcome.   REHAB POTENTIAL: Good  CLINICAL DECISION MAKING: Evolving/moderate complexity  EVALUATION COMPLEXITY: Moderate   GOALS: Goals reviewed with patient? Yes  SHORT TERM GOALS: Target date: 03/16/2024  Pt to be independent with initial HEP  Goal status: MET   2.  Pt to demo independent transfer from regular chair, without use of UEs.   Goal status: MET    LONG TERM GOALS: Target date: 04/20/2024  Pt to be independent with final HEP  Goal status: in progress  2.  Pt to demo improved strength of R hip to at least 4/5 for flex and abd, to improve ability and stability for gait.   Goal status: in progress  3.  Pt to  report decreased pain to 0-2/10 in R hip with activity.   Goal status: in progress  4.  Pt to demo optimal mechanics and safety  for gait and stairs, with LRAD, to improve ability and safety with community activity.  Goal status: in progress    PLAN:  PT FREQUENCY: 1-2x/week  PT DURATION: 8 weeks  PLANNED INTERVENTIONS: Therapeutic exercises, Therapeutic activity, Neuromuscular re-education, Patient/Family education, Self Care, Joint mobilization, Joint manipulation, Stair training, Orthotic/Fit training, DME instructions, Aquatic Therapy, Dry Needling, Electrical stimulation, Cryotherapy, Moist heat, Taping, Ultrasound, Ionotophoresis 4mg /ml Dexamethasone , Manual therapy,  Vasopneumatic device, Traction, Spinal manipulation, Spinal mobilization,Balance training, Gait training,   PLAN FOR NEXT SESSION: stairs - 8 steps split level-    Terrilee Few, PT, DPT 1:22 PM  03/24/24

## 2024-03-27 DIAGNOSIS — H6122 Impacted cerumen, left ear: Secondary | ICD-10-CM | POA: Diagnosis not present

## 2024-03-27 DIAGNOSIS — Z09 Encounter for follow-up examination after completed treatment for conditions other than malignant neoplasm: Secondary | ICD-10-CM | POA: Diagnosis not present

## 2024-03-28 ENCOUNTER — Ambulatory Visit: Admitting: Physical Therapy

## 2024-03-28 ENCOUNTER — Encounter: Payer: Self-pay | Admitting: Physical Therapy

## 2024-03-28 DIAGNOSIS — M25551 Pain in right hip: Secondary | ICD-10-CM

## 2024-03-28 DIAGNOSIS — R2689 Other abnormalities of gait and mobility: Secondary | ICD-10-CM | POA: Diagnosis not present

## 2024-03-28 DIAGNOSIS — M6281 Muscle weakness (generalized): Secondary | ICD-10-CM

## 2024-03-28 NOTE — Therapy (Signed)
 OUTPATIENT PHYSICAL THERAPY LOWER EXTREMITY TREATMENT   Patient Name: [name] [name] MRN: [id number] DOB:04-17-1932, 89 y.o., male Today's Date: 03/28/2024   END OF SESSION:  PT End of Session - 03/28/24 2050     Visit Number 7    Number of Visits 16    Date for PT Re-Evaluation 04/20/24    Authorization Type [employer] medicare    PT Start Time 1017    PT Stop Time 1056    PT Time Calculation (min) 39 min    Activity Tolerance Patient tolerated treatment well    Behavior During Therapy WFL for tasks assessed/performed               Past Medical History:  Diagnosis Date   Anemia    Arthritis    lower back bulging disc, hips, knees, thumbs, shoulder   Asthma    as a pre teen   Bladder cancer (HCC) UROLOGIST-  DR MCKENZIE   S/P TURBT 08-16-2017   Bladder wall hemorrhage 09/28/2023   BPH (benign prostatic hyperplasia)    CKD (chronic kidney disease), stage III (HCC)    STAGE 3  B CKD per lov 10-16-2020 dr Cathalene Clipper Seretha Dance kidney on chart   Dyspnea    Fatigue    MIDDLE OF DAY ON OCCASION   Frequency of urination    GERD (gastroesophageal reflux disease)    on protonix    Hematuria    Hematuria    History of colon polyps    HLD (hyperlipidemia)    HOH (hard of hearing)    slightly hoh right worse thanleft   HTN (hypertension)    Hypothyroidism    Incontinence of urine    weras pads all the time   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   Right Buccal Cheek   Pre-diabetes    Recurrent bladder papillary carcinoma (HCC) hx 2012 and 2014-- urologist- dr Claretta Croft   s/p  TURBT 08-16-2017  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   Renal mass 09/02/2022   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 10/02/2023   Sleep apnea    uses cpap set on 20 /4   Trigger finger of left hand 07/04/2021   last few months per pt   UTI (urinary tract infection) 08/20/2020   Weakness of extremity    left knee   Wears glasses    Past  Surgical History:  Procedure Laterality Date   BCG X 2     AFTER LAST 2 BLADDER TUMOR REMOVALS   CATARACT EXTRACTION, BILATERAL     COLONSCOPY  YRS AGO   CYSTOSCOPY N/A 10/29/2020   Procedure: CYSTOSCOPY;  Surgeon: Roxane Copp, MD;  Location: [facility];  Service: Urology;  Laterality: N/A;   CYSTOSCOPY N/A 07/08/2021   Procedure: CYSTOSCOPY;  Surgeon: Roxane Copp, MD;  Location: [facility];  Service: Urology;  Laterality: N/A;   CYSTOSCOPY W/ RETROGRADES Right 07/21/2023   Procedure: CYSTOSCOPY WITH RIGHT RETROGRADE PYELOGRAM;  Surgeon: Melody Spurling., MD;  Location: [facility];  Service: Urology;  Laterality: Right;   CYSTOSCOPY W/ URETERAL STENT PLACEMENT Bilateral 08/16/2017   Procedure: CYSTOSCOPY WITH RETROGRADE PYELOGRAM/URETERAL STENT PLACEMENT;  Surgeon: Marco Severs, MD;  Location: [facility];  Service: Urology;  Laterality: Bilateral;   CYSTOSCOPY W/ URETERAL STENT PLACEMENT Left 09/02/2022   Procedure: CYSTOSCOPY WITH RETROGRADE PYELOGRAM, URETERAL STENT PLACEMENT;  Surgeon: Osborn Blaze, MD;  Location: [facility];  Service: Urology;  Laterality:  Left;   CYSTOSCOPY WITH BIOPSY N/A 06/25/2020   Procedure: CYSTOSCOPY FULGURATION   BLADDER  BIOPSY;  Surgeon: Roxane Copp, MD;  Location: [facility];  Service: Urology;  Laterality: N/A;   CYSTOSCOPY WITH FULGERATION N/A 12/08/2019   Procedure: CYSTOSCOPY WITH FULGERATION;  Surgeon: Marco Severs, MD;  Location: [facility];  Service: Urology;  Laterality: N/A;   CYSTOSCOPY WITH FULGERATION N/A 03/24/2022   Procedure: CYSTOSCOPY WITH FULGERATION;  Surgeon: Roxane Copp, MD;  Location: [facility];  Service: Urology;  Laterality: N/A;   CYSTOSCOPY WITH RETROGRADE PYELOGRAM, URETEROSCOPY AND STENT PLACEMENT Left 03/24/2022   Procedure: CYSTOSCOPY WITH RETROGRADE PYELOGRAM, DIAGNOSTIC URETEROSCOPY WITH BIOPSY AND STENT PLACEMENT;   Surgeon: Roxane Copp, MD;  Location: [facility];  Service: Urology;  Laterality: Left;  90 MINUTES   CYSTOSCOPY WITH RETROGRADE PYELOGRAM, URETEROSCOPY AND STENT PLACEMENT Left 04/07/2022   Procedure: CYSTOSCOPY WITH  RETROGRADE PYELOGRAM, DIAGNOSTIC URETEROSCOPY WITH BIOPSY AND STENT EXCHANGE;  Surgeon: Roxane Copp, MD;  Location: [facility];  Service: Urology;  Laterality: Left;  75 MINUTES   CYSTOSCOPY WITH RETROGRADE PYELOGRAM, URETEROSCOPY AND STENT PLACEMENT Left 08/11/2022   Procedure: CYSTOSCOPY WITH RETROGRADE PYELOGRAM, ANTEGRADE NEPHROSTOGRAM, DIAGNOSTIC  URETEROSCOPY;  Surgeon: Roxane Copp, MD;  Location: [facility];  Service: Urology;  Laterality: Left;  90 MINS   HOLMIUM LASER APPLICATION Left 08/11/2022   Procedure: HOLMIUM LASER APPLICATION;  Surgeon: Roxane Copp, MD;  Location: [facility];  Service: Urology;  Laterality: Left;   IR NEPHROSTOMY EXCHANGE LEFT  05/15/2022   IR NEPHROSTOMY EXCHANGE LEFT  07/10/2022   IR NEPHROSTOMY PLACEMENT LEFT  05/06/2022   KNEE ARTHROSCOPY Left 06/10/2011   LYMPH NODE DISSECTION Left 09/02/2022   Procedure: RETROPERITONEAL LYMPH NODE DISSECTION;  Surgeon: Osborn Blaze, MD;  Location: [facility];  Service: Urology;  Laterality: Left;   PROSTATE BIOPSY  2000   ROBOT ASSITED LAPAROSCOPIC NEPHROURETERECTOMY Left 09/02/2022   Procedure: XI ROBOT ASSITED LAPAROSCOPIC NEPHROURETERECTOMY;  Surgeon: Osborn Blaze, MD;  Location: [facility];  Service: Urology;  Laterality: Left;   ROBOT LAP RADICAL CYSTOPROSTATECTOMY PELVIC LYMPHADENECTOMY, NEOBLADDER N/A 02/04/2024   Procedure: ROBOT ASSISTED LAPAROSCOPIC RADICAL CYSTOPROSTATECTOMY;  Surgeon: Melody Spurling., MD;  Location: [facility];  Service: Urology;  Laterality: N/A;  300 MINUTES NEEDED FOR CASE   TRANSURETHRAL RESECTION OF BLADDER TUMOR N/A 08/16/2017   Procedure: TRANSURETHRAL RESECTION OF BLADDER TUMOR (TURBT);  Surgeon: Marco Severs, MD;  Location: [facility];  Service:  Urology;  Laterality: N/A;   TRANSURETHRAL RESECTION OF BLADDER TUMOR  12-09-2012   dr Lavona Pounds [facility]   and TURP   TRANSURETHRAL RESECTION OF BLADDER TUMOR N/A 09/20/2017   Procedure: TRANSURETHRAL RESECTION OF BLADDER TUMOR (TURBT);  Surgeon: Marco Severs, MD;  Location: [facility];  Service: Urology;  Laterality: N/A;   TRANSURETHRAL RESECTION OF BLADDER TUMOR N/A 12/08/2019   Procedure: TRANSURETHRAL RESECTION OF BLADDER TUMOR;  Surgeon: Marco Severs, MD;  Location: [facility];  Service: Urology;  Laterality: N/A;  1 HR   TRANSURETHRAL RESECTION OF BLADDER TUMOR N/A 10/29/2020   Procedure: BLADDER FULGERATION;  Surgeon: Roxane Copp, MD;  Location: [facility];  Service: Urology;  Laterality: N/A;  30 MINS   TRANSURETHRAL RESECTION OF BLADDER TUMOR N/A 07/08/2021   Procedure: TRANSURETHRAL RESECTION OF BLADDER TUMOR (TURBT)/FULGERATION;  Surgeon: Roxane Copp, MD;  Location: [facility];  Service: Urology;  Laterality: N/A;  45 MINS   TRANSURETHRAL RESECTION OF BLADDER TUMOR N/A 12/02/2021   Procedure: TRANSURETHRAL RESECTION OF BLADDER TUMOR (TURBT);  Surgeon: Roxane Copp, MD;  Location: [facility];  Service: Urology;  Laterality: N/A;   TRANSURETHRAL RESECTION OF BLADDER TUMOR N/A 07/21/2023   Procedure: TRANSURETHRAL RESECTION OF BLADDER TUMOR (TURBT);  Surgeon: Melody Spurling., MD;  Location: [facility];  Service: Urology;  Laterality: N/A;  60 MINUTES   TRANSURETHRAL RESECTION OF BLADDER TUMOR N/A 10/01/2023   Procedure: TRANSURETHRAL RESECTION OF BLADDER TUMOR (TURBT)/CLOT EVACUATION WITH FULGERATION;  Surgeon: Melody Spurling., MD;  Location: [facility];  Service: Urology;  Laterality: N/A;   TRANSURETHRAL RESECTION OF PROSTATE  2011   UPPER GI ENDOSCOPY  YRS AGO   URETEROSCOPY Right 02/04/2024   Procedure: RIGHT CUTANEOUS URETEROSCOPY;  Surgeon: Melody Spurling., MD;  Location: [facility];   Service: Urology;  Laterality: Right;   Patient Active Problem List   Diagnosis Date Noted   Chronic right hip pain 03/20/2024   Prostatic adenocarcinoma (HCC) 03/03/2024   UTI (urinary tract infection) 03/02/2024   Complicated UTI (urinary tract infection) 03/02/2024   Ureterostomy status (HCC) 02/15/2024   History of iron deficiency 12/20/2023   Dysuria 10/15/2023   AKI (acute kidney injury) (HCC) 10/08/2023   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 10/02/2023   Decreased appetite 09/28/2023   Hardening of the aorta (main artery of the heart) (HCC) 09/28/2023   Mixed hyperlipidemia 09/28/2023   Prediabetes 09/28/2023   Panic attack 09/28/2023   Pedal edema 09/28/2023   Osteoarthritis 09/28/2023   Sinusitis chronic, sphenoidal 09/28/2023   Acute blood loss anemia (ABLA) 09/28/2023   Hypothyroidism 09/28/2023   Low serum vitamin B12 09/10/2023   Anemia of chronic disease 09/10/2023   Normocytic anemia 08/06/2023   Unintentional weight loss 04/09/2021   Leg weakness, bilateral 04/09/2021   Surgical counseling visit 04/09/2021   Essential hypertension 05/23/2020   Sleep apnea treated with nocturnal BiPAP 01/25/2020   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   Hypersomnia with sleep apnea 09/15/2019   Cardiac arrhythmia due to premature depolarization 09/15/2019   Excessive daytime sleepiness 09/15/2019   CKD (chronic kidney disease) stage 4, GFR 15-29 ml/min (HCC) - Baseline Scr 3.0 09/15/2019   Malignant neoplasm of urinary bladder (HCC) 09/15/2019   Gross hematuria 09/11/2017   Benign prostatic hyperplasia without lower urinary tract symptoms 12/09/2012   GERD (gastroesophageal reflux disease) 11/28/2012     PCP: Virgle Grime  REFERRING PROVIDER: Virgle Grime  REFERRING DIAG: Leg weakness   THERAPY DIAG:  Pain in right hip  Muscle weakness (generalized)  Other abnormalities of gait and mobility  Rationale for Evaluation and Treatment:  Rehabilitation  ONSET DATE:    SUBJECTIVE:   SUBJECTIVE STATEMENT:  Pt states hip continues to be sore, and limiting, Not too bad today. Has been able to go without walker for a few steps here and there.   Eval:  Pt states weakness in legs R>L ongoing for a while. He had significant weakness previously from medication issues but has slightly improved. He states pain in his R hip, sharp pains with twisting at times, and instability feeling with walking and activity due to hip pain.He has had previous L knee surgery, states R hip OA, and has had recent surgery for cancer, with bladder and prostate removal. Since then he states feeling pretty good, he does have swelling in feet and legs, just saw Dr this week who was not concerned. He  is wearing sandals today, with obvious swelling in feet and legs.  He used to walk about 2 mi/day in past years, recently he did walk about 1.5 miles States minimal fatigue with endurance, but more limitation from R hip and leg weakness.  He is currently living with son and his wife in split level house. States doing ok with stairs for now. Has walking stick, but not using today, does usually use most of the time.    PERTINENT HISTORY: Recent bladder and prostate removal for CA, CKD,  Nephrectomy (L),    PAIN:  Are you having pain? Yes: NPRS scale: 5-6/10    Pain location: R hip/anterior/lateral Pain description: sore, stiff, shooting with twisting motion Aggravating factors: increased activity , initial standing  Relieving factors: none stated   PRECAUTIONS: None  WEIGHT BEARING RESTRICTIONS: No  FALLS:  Has patient fallen in last 6 months? No  PLOF: Independent  PATIENT GOALS:  Decreased pain in hip, improve walking and balance.   NEXT MD VISIT:   OBJECTIVE:   DIAGNOSTIC FINDINGS:   PATIENT SURVEYS:    COGNITION: Overall cognitive status: Within functional limits for tasks assessed     SENSATION: WFL  EDEMA: mod/significant edema in  bil lower legs and feet.    POSTURE:    No Significant postural limitations  PALPATION:     LOWER EXTREMITY ROM: Hips:  L: mild limitation for flex, ER, mod for IR,   R:  mod limitation for all motions.   LOWER EXTREMITY MMT:  MMT Left eval Right  eval Left:  5/2 Right 5/2  Hip flexion 4- 3- 4 3 /3-  Hip extension      Hip abduction      Hip adduction      Hip internal rotation      Hip external rotation      Knee flexion 4 4    Knee extension 4+ 4+    Ankle dorsiflexion      Ankle plantarflexion      Ankle inversion      Ankle eversion       (Blank rows = not tested)  LOWER EXTREMITY SPECIAL TESTS:    FUNCTIONAL TESTS:    GAIT: Distance walked: 257ft Assistive device utilized: Walker - 2 wheeled Level of assistance: Complete Independence and Modified independence Comments: decreased step height when R hip painful.    TODAY'S TREATMENT:                                                                                                                              DATE:   03/28/24: Therapeutic Exercise: Aerobic: Supine:   hip ER rom x 10;  heel slides x 12 on R;  SAQ 2 x 10 (3lb)  bil (more comfortable on hip than LAQ);   PROM R hip, all motions.  Seated:   Sit to stand 2 x 5; mat table, no hands.  Standing:  Stretches:  Neuromuscular Re-education: Manual Therapy:  LAD R hip;  Therapeutic Activity:   Self Care:   Gait:  ambulation with SPC, cuing for step height, cuing for sequencing and mechanics, as well as cane in L or R hand (better mechanics with cane in L hand) .  45 ft x 8    03/21/24: Therapeutic Exercise: Aerobic: Supine:  SLR x 10 bil; Marching x15 (for shorter lever arm) Seated:   LAQ 2 x 10 bil; Ankle pumps and circles- review for HEP Standing: Heel raises x 15; Toe taps on 6 in step x 10;  Stretches:    LTR x 15;  Neuromuscular Re-education: Manual Therapy:  Therapeutic Activity: sit to stand 2 x  at higher mat table, no UE support, with  education on body mechanics Stairs: up/down 5 steps with 2 hands on 1 rail to mimic home environment, step to pattern, cueing and education on safety and mechanics,  x 4  Self Care:   Gait:     PATIENT EDUCATION:  Education details: updated and reviewed HEP Person educated: Patient Education method: Explanation, Demonstration, Tactile cues, Verbal cues, and Handouts Education comprehension: verbalized understanding, returned demonstration, verbal cues required, tactile cues required, and needs further education   HOME EXERCISE PROGRAM: Access Code: [email or web address]  ASSESSMENT:  CLINICAL IMPRESSION: Pt with good tolerance for activities today. Education and practice today for use of SPC. Pt with good safety and ability, improved gait mechanics with can in L hand, but pt more comfortable with it in R hand. Will continue gait training as able. Pt progressing well, but still very limited by pain and stiffness in R hip, moreso with standing and weight bearing activities.   Eval: Patient presents with primary complaint of  pain in R hip, and weakness in R hip/LE. He has stiffness in bil hips, R>L, with lack of ROM. He has decreased strength in R hip vs L, with feelings of instability with activity. He has decreased gait mechanics, and will benefit from education on use of AD. He has decreased ability and stability with functional activity, gait, and stairs. He is overall deconditioned and will benefit from education on HEP . Pt with decreased ability for full functional activities. Pt will  benefit from skilled PT to improve deficits and pain and to return to PLOF.   OBJECTIVE IMPAIRMENTS: Abnormal gait, decreased activity tolerance, decreased balance, decreased knowledge of use of DME, decreased mobility, difficulty walking, decreased ROM, decreased strength, decreased safety awareness, impaired flexibility, improper body mechanics, and pain.   ACTIVITY LIMITATIONS: bending, standing, squatting, stairs,  transfers, and locomotion level  PARTICIPATION LIMITATIONS: meal prep, cleaning, shopping, and community activity  PERSONAL FACTORS: Age, Past/current experiences, Time since onset of injury/illness/exacerbation, and 1-2 comorbidities: CA, recent surgery, OA  are also affecting patient's functional outcome.   REHAB POTENTIAL: Good  CLINICAL DECISION MAKING: Evolving/moderate complexity  EVALUATION COMPLEXITY: Moderate   GOALS: Goals reviewed with patient? Yes  SHORT TERM GOALS: Target date: 03/16/2024  Pt to be independent with initial HEP  Goal status: MET   2.  Pt to demo independent transfer from regular chair, without use of UEs.   Goal status: MET    LONG TERM GOALS: Target date: 04/20/2024  Pt to be independent with final HEP  Goal status: in progress  2.  Pt to demo improved strength of R hip to at least 4/5 for flex and abd, to improve ability and stability for gait.   Goal status: in progress  3.  Pt  to report decreased pain to 0-2/10 in R hip with activity.   Goal status: in progress  4.  Pt to demo optimal mechanics and safety  for gait and stairs, with LRAD, to improve ability and safety with community activity.  Goal status: in progress    PLAN:  PT FREQUENCY: 1-2x/week  PT DURATION: 8 weeks  PLANNED INTERVENTIONS: Therapeutic exercises, Therapeutic activity, Neuromuscular re-education, Patient/Family education, Self Care, Joint mobilization, Joint manipulation, Stair training, Orthotic/Fit training, DME instructions, Aquatic Therapy, Dry Needling, Electrical stimulation, Cryotherapy, Moist heat, Taping, Ultrasound, Ionotophoresis 4mg /ml Dexamethasone , Manual therapy,  Vasopneumatic device, Traction, Spinal manipulation, Spinal mobilization,Balance training, Gait training,   PLAN FOR NEXT SESSION: stairs - 8 steps split level-    Terrilee Few, PT, DPT 8:51 PM  03/28/24

## 2024-03-30 ENCOUNTER — Encounter: Payer: Self-pay | Admitting: Physical Therapy

## 2024-03-30 ENCOUNTER — Ambulatory Visit: Admitting: Physical Therapy

## 2024-03-30 DIAGNOSIS — R2689 Other abnormalities of gait and mobility: Secondary | ICD-10-CM | POA: Diagnosis not present

## 2024-03-30 DIAGNOSIS — M6281 Muscle weakness (generalized): Secondary | ICD-10-CM | POA: Diagnosis not present

## 2024-03-30 DIAGNOSIS — M25551 Pain in right hip: Secondary | ICD-10-CM | POA: Diagnosis not present

## 2024-03-30 NOTE — Therapy (Signed)
 OUTPATIENT PHYSICAL THERAPY LOWER EXTREMITY TREATMENT   Patient Name: [name] [name] MRN: [id number] DOB:Nov 08, 1932, 89 y.o., male Today's Date: 03/30/2024   END OF SESSION:  PT End of Session - 03/30/24 1056     Visit Number 8    Number of Visits 16    Date for PT Re-Evaluation 04/20/24    Authorization Type [employer] medicare    PT Start Time 1100    PT Stop Time 1145    PT Time Calculation (min) 45 min    Activity Tolerance Patient tolerated treatment well    Behavior During Therapy WFL for tasks assessed/performed               Past Medical History:  Diagnosis Date   Anemia    Arthritis    lower back bulging disc, hips, knees, thumbs, shoulder   Asthma    as a pre teen   Bladder cancer (HCC) UROLOGIST-  DR MCKENZIE   S/P TURBT 08-16-2017   Bladder wall hemorrhage 09/28/2023   BPH (benign prostatic hyperplasia)    CKD (chronic kidney disease), stage III (HCC)    STAGE 3  B CKD per lov 10-16-2020 dr Cathalene Clipper Seretha Dance kidney on chart   Dyspnea    Fatigue    MIDDLE OF DAY ON OCCASION   Frequency of urination    GERD (gastroesophageal reflux disease)    on protonix    Hematuria    Hematuria    History of colon polyps    HLD (hyperlipidemia)    HOH (hard of hearing)    slightly hoh right worse thanleft   HTN (hypertension)    Hypothyroidism    Incontinence of urine    weras pads all the time   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   Right Buccal Cheek   Pre-diabetes    Recurrent bladder papillary carcinoma (HCC) hx 2012 and 2014-- urologist- dr Claretta Croft   s/p  TURBT 08-16-2017  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   Renal mass 09/02/2022   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 10/02/2023   Sleep apnea    uses cpap set on 20 /4   Trigger finger of left hand 07/04/2021   last few months per pt   UTI (urinary tract infection) 08/20/2020   Weakness of extremity    left knee   Wears glasses    Past  Surgical History:  Procedure Laterality Date   BCG X 2     AFTER LAST 2 BLADDER TUMOR REMOVALS   CATARACT EXTRACTION, BILATERAL     COLONSCOPY  YRS AGO   CYSTOSCOPY N/A 10/29/2020   Procedure: CYSTOSCOPY;  Surgeon: Roxane Copp, MD;  Location: [facility];  Service: Urology;  Laterality: N/A;   CYSTOSCOPY N/A 07/08/2021   Procedure: CYSTOSCOPY;  Surgeon: Roxane Copp, MD;  Location: [facility];  Service: Urology;  Laterality: N/A;   CYSTOSCOPY W/ RETROGRADES Right 07/21/2023   Procedure: CYSTOSCOPY WITH RIGHT RETROGRADE PYELOGRAM;  Surgeon: Melody Spurling., MD;  Location: [facility];  Service: Urology;  Laterality: Right;   CYSTOSCOPY W/ URETERAL STENT PLACEMENT Bilateral 08/16/2017   Procedure: CYSTOSCOPY WITH RETROGRADE PYELOGRAM/URETERAL STENT PLACEMENT;  Surgeon: Marco Severs, MD;  Location: [facility];  Service: Urology;  Laterality: Bilateral;   CYSTOSCOPY W/ URETERAL STENT PLACEMENT Left 09/02/2022   Procedure: CYSTOSCOPY WITH RETROGRADE PYELOGRAM, URETERAL STENT PLACEMENT;  Surgeon: Osborn Blaze, MD;  Location: [facility];  Service: Urology;  Laterality:  Left;   CYSTOSCOPY WITH BIOPSY N/A 06/25/2020   Procedure: CYSTOSCOPY FULGURATION   BLADDER  BIOPSY;  Surgeon: Roxane Copp, MD;  Location: [facility];  Service: Urology;  Laterality: N/A;   CYSTOSCOPY WITH FULGERATION N/A 12/08/2019   Procedure: CYSTOSCOPY WITH FULGERATION;  Surgeon: Marco Severs, MD;  Location: [facility];  Service: Urology;  Laterality: N/A;   CYSTOSCOPY WITH FULGERATION N/A 03/24/2022   Procedure: CYSTOSCOPY WITH FULGERATION;  Surgeon: Roxane Copp, MD;  Location: [facility];  Service: Urology;  Laterality: N/A;   CYSTOSCOPY WITH RETROGRADE PYELOGRAM, URETEROSCOPY AND STENT PLACEMENT Left 03/24/2022   Procedure: CYSTOSCOPY WITH RETROGRADE PYELOGRAM, DIAGNOSTIC URETEROSCOPY WITH BIOPSY AND STENT PLACEMENT;   Surgeon: Roxane Copp, MD;  Location: [facility];  Service: Urology;  Laterality: Left;  90 MINUTES   CYSTOSCOPY WITH RETROGRADE PYELOGRAM, URETEROSCOPY AND STENT PLACEMENT Left 04/07/2022   Procedure: CYSTOSCOPY WITH  RETROGRADE PYELOGRAM, DIAGNOSTIC URETEROSCOPY WITH BIOPSY AND STENT EXCHANGE;  Surgeon: Roxane Copp, MD;  Location: [facility];  Service: Urology;  Laterality: Left;  75 MINUTES   CYSTOSCOPY WITH RETROGRADE PYELOGRAM, URETEROSCOPY AND STENT PLACEMENT Left 08/11/2022   Procedure: CYSTOSCOPY WITH RETROGRADE PYELOGRAM, ANTEGRADE NEPHROSTOGRAM, DIAGNOSTIC  URETEROSCOPY;  Surgeon: Roxane Copp, MD;  Location: [facility];  Service: Urology;  Laterality: Left;  90 MINS   HOLMIUM LASER APPLICATION Left 08/11/2022   Procedure: HOLMIUM LASER APPLICATION;  Surgeon: Roxane Copp, MD;  Location: [facility];  Service: Urology;  Laterality: Left;   IR NEPHROSTOMY EXCHANGE LEFT  05/15/2022   IR NEPHROSTOMY EXCHANGE LEFT  07/10/2022   IR NEPHROSTOMY PLACEMENT LEFT  05/06/2022   KNEE ARTHROSCOPY Left 06/10/2011   LYMPH NODE DISSECTION Left 09/02/2022   Procedure: RETROPERITONEAL LYMPH NODE DISSECTION;  Surgeon: Osborn Blaze, MD;  Location: [facility];  Service: Urology;  Laterality: Left;   PROSTATE BIOPSY  2000   ROBOT ASSITED LAPAROSCOPIC NEPHROURETERECTOMY Left 09/02/2022   Procedure: XI ROBOT ASSITED LAPAROSCOPIC NEPHROURETERECTOMY;  Surgeon: Osborn Blaze, MD;  Location: [facility];  Service: Urology;  Laterality: Left;   ROBOT LAP RADICAL CYSTOPROSTATECTOMY PELVIC LYMPHADENECTOMY, NEOBLADDER N/A 02/04/2024   Procedure: ROBOT ASSISTED LAPAROSCOPIC RADICAL CYSTOPROSTATECTOMY;  Surgeon: Melody Spurling., MD;  Location: [facility];  Service: Urology;  Laterality: N/A;  300 MINUTES NEEDED FOR CASE   TRANSURETHRAL RESECTION OF BLADDER TUMOR N/A 08/16/2017   Procedure: TRANSURETHRAL RESECTION OF BLADDER TUMOR (TURBT);  Surgeon: Marco Severs, MD;  Location: [facility];  Service:  Urology;  Laterality: N/A;   TRANSURETHRAL RESECTION OF BLADDER TUMOR  12-09-2012   dr Lavona Pounds [facility]   and TURP   TRANSURETHRAL RESECTION OF BLADDER TUMOR N/A 09/20/2017   Procedure: TRANSURETHRAL RESECTION OF BLADDER TUMOR (TURBT);  Surgeon: Marco Severs, MD;  Location: [facility];  Service: Urology;  Laterality: N/A;   TRANSURETHRAL RESECTION OF BLADDER TUMOR N/A 12/08/2019   Procedure: TRANSURETHRAL RESECTION OF BLADDER TUMOR;  Surgeon: Marco Severs, MD;  Location: [facility];  Service: Urology;  Laterality: N/A;  1 HR   TRANSURETHRAL RESECTION OF BLADDER TUMOR N/A 10/29/2020   Procedure: BLADDER FULGERATION;  Surgeon: Roxane Copp, MD;  Location: [facility];  Service: Urology;  Laterality: N/A;  30 MINS   TRANSURETHRAL RESECTION OF BLADDER TUMOR N/A 07/08/2021   Procedure: TRANSURETHRAL RESECTION OF BLADDER TUMOR (TURBT)/FULGERATION;  Surgeon: Roxane Copp, MD;  Location: [facility];  Service: Urology;  Laterality: N/A;  45 MINS   TRANSURETHRAL RESECTION OF BLADDER TUMOR N/A 12/02/2021   Procedure: TRANSURETHRAL RESECTION OF BLADDER TUMOR (TURBT);  Surgeon: Roxane Copp, MD;  Location: [facility];  Service: Urology;  Laterality: N/A;   TRANSURETHRAL RESECTION OF BLADDER TUMOR N/A 07/21/2023   Procedure: TRANSURETHRAL RESECTION OF BLADDER TUMOR (TURBT);  Surgeon: Melody Spurling., MD;  Location: [facility];  Service: Urology;  Laterality: N/A;  60 MINUTES   TRANSURETHRAL RESECTION OF BLADDER TUMOR N/A 10/01/2023   Procedure: TRANSURETHRAL RESECTION OF BLADDER TUMOR (TURBT)/CLOT EVACUATION WITH FULGERATION;  Surgeon: Melody Spurling., MD;  Location: [facility];  Service: Urology;  Laterality: N/A;   TRANSURETHRAL RESECTION OF PROSTATE  2011   UPPER GI ENDOSCOPY  YRS AGO   URETEROSCOPY Right 02/04/2024   Procedure: RIGHT CUTANEOUS URETEROSCOPY;  Surgeon: Melody Spurling., MD;  Location: [facility];   Service: Urology;  Laterality: Right;   Patient Active Problem List   Diagnosis Date Noted   Chronic right hip pain 03/20/2024   Prostatic adenocarcinoma (HCC) 03/03/2024   UTI (urinary tract infection) 03/02/2024   Complicated UTI (urinary tract infection) 03/02/2024   Ureterostomy status (HCC) 02/15/2024   History of iron deficiency 12/20/2023   Dysuria 10/15/2023   AKI (acute kidney injury) (HCC) 10/08/2023   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 10/02/2023   Decreased appetite 09/28/2023   Hardening of the aorta (main artery of the heart) (HCC) 09/28/2023   Mixed hyperlipidemia 09/28/2023   Prediabetes 09/28/2023   Panic attack 09/28/2023   Pedal edema 09/28/2023   Osteoarthritis 09/28/2023   Sinusitis chronic, sphenoidal 09/28/2023   Acute blood loss anemia (ABLA) 09/28/2023   Hypothyroidism 09/28/2023   Low serum vitamin B12 09/10/2023   Anemia of chronic disease 09/10/2023   Normocytic anemia 08/06/2023   Unintentional weight loss 04/09/2021   Leg weakness, bilateral 04/09/2021   Surgical counseling visit 04/09/2021   Essential hypertension 05/23/2020   Sleep apnea treated with nocturnal BiPAP 01/25/2020   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   Hypersomnia with sleep apnea 09/15/2019   Cardiac arrhythmia due to premature depolarization 09/15/2019   Excessive daytime sleepiness 09/15/2019   CKD (chronic kidney disease) stage 4, GFR 15-29 ml/min (HCC) - Baseline Scr 3.0 09/15/2019   Malignant neoplasm of urinary bladder (HCC) 09/15/2019   Gross hematuria 09/11/2017   Benign prostatic hyperplasia without lower urinary tract symptoms 12/09/2012   GERD (gastroesophageal reflux disease) 11/28/2012     PCP: Virgle Grime  REFERRING PROVIDER: Virgle Grime  REFERRING DIAG: Leg weakness   THERAPY DIAG:  Pain in right hip  Muscle weakness (generalized)  Other abnormalities of gait and mobility  Rationale for Evaluation and Treatment:  Rehabilitation  ONSET DATE:    SUBJECTIVE:   SUBJECTIVE STATEMENT:  Pt states hip continues to be sore, and limiting his walking and mobility.  Eval:  Pt states weakness in legs R>L ongoing for a while. He had significant weakness previously from medication issues but has slightly improved. He states pain in his R hip, sharp pains with twisting at times, and instability feeling with walking and activity due to hip pain.He has had previous L knee surgery, states R hip OA, and has had recent surgery for cancer, with bladder and prostate removal. Since then he states feeling pretty good, he does have swelling in feet and legs, just saw Dr this week who was not concerned. He is wearing sandals today, with obvious swelling in feet and legs.  He used to  walk about 2 mi/day in past years, recently he did walk about 1.5 miles States minimal fatigue with endurance, but more limitation from R hip and leg weakness.  He is currently living with son and his wife in split level house. States doing ok with stairs for now. Has walking stick, but not using today, does usually use most of the time.    PERTINENT HISTORY: Recent bladder and prostate removal for CA, CKD,  Nephrectomy (L),    PAIN:  Are you having pain? Yes: NPRS scale: 5-6/10    Pain location: R hip/anterior/lateral Pain description: sore, stiff, shooting with twisting motion Aggravating factors: increased activity , initial standing  Relieving factors: none stated   PRECAUTIONS: None  WEIGHT BEARING RESTRICTIONS: No  FALLS:  Has patient fallen in last 6 months? No  PLOF: Independent  PATIENT GOALS:  Decreased pain in hip, improve walking and balance.   NEXT MD VISIT:   OBJECTIVE:   DIAGNOSTIC FINDINGS:   PATIENT SURVEYS:    COGNITION: Overall cognitive status: Within functional limits for tasks assessed     SENSATION: WFL  EDEMA: mod/significant edema in bil lower legs and feet.    POSTURE:    No Significant  postural limitations  PALPATION:     LOWER EXTREMITY ROM: Hips:  L: mild limitation for flex, ER, mod for IR,   R:  mod limitation for all motions.   LOWER EXTREMITY MMT:  MMT Left eval Right  eval Left:  5/2 Right 5/2  Hip flexion 4- 3- 4 3 /3-  Hip extension      Hip abduction      Hip adduction      Hip internal rotation      Hip external rotation      Knee flexion 4 4    Knee extension 4+ 4+    Ankle dorsiflexion      Ankle plantarflexion      Ankle inversion      Ankle eversion       (Blank rows = not tested)  LOWER EXTREMITY SPECIAL TESTS:    FUNCTIONAL TESTS:    GAIT: Distance walked: 289ft Assistive device utilized: Walker - 2 wheeled Level of assistance: Complete Independence and Modified independence Comments: decreased step height when R hip painful.    TODAY'S TREATMENT:                                                                                                                              DATE:   03/30/24: Therapeutic Exercise: Aerobic: Supine:   hip ER rom x 10;  heel slides x 10 on R;  PROM R hip, all motions.  Seated:   Sit to stand 2 x 6; mat table, no hands.  Hip IR/ER rom x 10;  Standing:  heel raises 2 x 10;  R knee flexion x 10 ; marching x 15;  Stretches:    Neuromuscular Re-education: Manual Therapy:  LAD R hip;  Therapeutic Activity: Self Care:   Gait:  ambulation with SPC, cuing for step height, cuing for sequencing and mechanics, 45 ft x 6;   03/21/24: Therapeutic Exercise: Aerobic: Supine:  SLR x 10 bil; Marching x15 (for shorter lever arm) Seated:   LAQ 2 x 10 bil; Ankle pumps and circles- review for HEP Standing: Heel raises x 15; Toe taps on 6 in step x 10;  Stretches:    LTR x 15;  Neuromuscular Re-education: Manual Therapy:  Therapeutic Activity: sit to stand 2 x  at higher mat table, no UE support, with education on body mechanics Stairs: up/down 5 steps with 2 hands on 1 rail to mimic home environment, step to  pattern, cueing and education on safety and mechanics,  x 4  Self Care:   Gait:     PATIENT EDUCATION:  Education details: updated and reviewed HEP Person educated: Patient Education method: Explanation, Demonstration, Tactile cues, Verbal cues, and Handouts Education comprehension: verbalized understanding, returned demonstration, verbal cues required, tactile cues required, and needs further education   HOME EXERCISE PROGRAM: Access Code: [email or web address]  ASSESSMENT:  CLINICAL IMPRESSION: Pt with good tolerance for activities today, but continues to be very limited  with range of motion and ability for strengthening due to pain. He would like to do more activity and walking but pain is limiting. Pt will be seen 1x/wk until his upcoming MD appt. Discussed continuing light ROM and heat prior to sleeping to improve ability to fall asleep.   Eval: Patient presents with primary complaint of  pain in R hip, and weakness in R hip/LE. He has stiffness in bil hips, R>L, with lack of ROM. He has decreased strength in R hip vs L, with feelings of instability with activity. He has decreased gait mechanics, and will benefit from education on use of AD. He has decreased ability and stability with functional activity, gait, and stairs. He is overall deconditioned and will benefit from education on HEP . Pt with decreased ability for full functional activities. Pt will  benefit from skilled PT to improve deficits and pain and to return to PLOF.   OBJECTIVE IMPAIRMENTS: Abnormal gait, decreased activity tolerance, decreased balance, decreased knowledge of use of DME, decreased mobility, difficulty walking, decreased ROM, decreased strength, decreased safety awareness, impaired flexibility, improper body mechanics, and pain.   ACTIVITY LIMITATIONS: bending, standing, squatting, stairs, transfers, and locomotion level  PARTICIPATION LIMITATIONS: meal prep, cleaning, shopping, and community activity  PERSONAL  FACTORS: Age, Past/current experiences, Time since onset of injury/illness/exacerbation, and 1-2 comorbidities: CA, recent surgery, OA are also affecting patient's functional outcome.   REHAB POTENTIAL: Good  CLINICAL DECISION MAKING: Evolving/moderate complexity  EVALUATION COMPLEXITY: Moderate   GOALS: Goals reviewed with patient? Yes  SHORT TERM GOALS: Target date: 03/16/2024  Pt to be independent with initial HEP  Goal status: MET   2.  Pt to demo independent transfer from regular chair, without use of UEs.   Goal status: MET    LONG TERM GOALS: Target date: 04/20/2024  Pt to be independent with final HEP  Goal status: in progress  2.  Pt to demo improved strength of R hip to at least 4/5 for flex and abd, to improve ability and stability for gait.   Goal status: in progress  3.  Pt to report decreased pain to 0-2/10 in R hip with activity.   Goal status: in progress  4.  Pt to demo optimal mechanics and safety  for gait and stairs, with  LRAD, to improve ability and safety with community activity.  Goal status: in progress    PLAN:  PT FREQUENCY: 1-2x/week  PT DURATION: 8 weeks  PLANNED INTERVENTIONS: Therapeutic exercises, Therapeutic activity, Neuromuscular re-education, Patient/Family education, Self Care, Joint mobilization, Joint manipulation, Stair training, Orthotic/Fit training, DME instructions, Aquatic Therapy, Dry Needling, Electrical stimulation, Cryotherapy, Moist heat, Taping, Ultrasound, Ionotophoresis 4mg /ml Dexamethasone , Manual therapy,  Vasopneumatic device, Traction, Spinal manipulation, Spinal mobilization,Balance training, Gait training,   PLAN FOR NEXT SESSION: stairs - 8 steps split level-    Terrilee Few, PT, DPT 10:57 AM  03/30/24

## 2024-04-03 NOTE — Progress Notes (Unsigned)
 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    Patient received 1 dose of pembrolizumab  and developed elevated creatinine up to 4.07. Baseline mostly below 3.4. Treatment stopped. Repeat UA without signs of infectious process.  AKI on CKD improved on prednisone .   He now has now had cystoprostatectomy and final pathology showed T1N0 UC. Will wean off steroids. Started 10 mg every other day for 2 weeks, then stop.  Assessment & Plan   No orders of the defined types were placed in this encounter.    Lowanda Ruddy, MD  INTERVAL HISTORY: Patient returns for follow-up.  Oncology History Overview Note  2000 BPH with elevated PSA. Report biopsy was benign.   2011 to now with multiple TURBT   2021 TaG3 bladder cancer. TURBT + BCG x 6  2023 TaG1 bladder + left renal pelvis cancer, failed [name]  >>  08/2022 Left nephroureterectomy + RPLND. 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  06/2023 cysto w/ TURBT multifocal papillary bladder tumor recurrence (dome, left trigone, bladder neck, prostate urethral tissue) ==> TaG3 with negative muscle  Path: A. BLADDER TUMOR, TURBT:  Noninvasive high grade papillary urothelial carcinoma with inverted  growth pattern   muscularis propria (detrusor muscle) is present and not involved by  carcinoma   B. BLADDER TUMOR, NECK, BIOPSY:  Noninvasive high-grade papillary urothelial carcinoma with inverted  growth pattern  Submucosa, prostatic glands and muscularis is present and not involved  by carcinoma   C. BLADDER TUMOR, BASE, BIOPSY:  Noninvasive high grade papillary urothelial carcinoma  Muscularis propria is present and not involved by carcinoma       Malignant neoplasm of urinary bladder (HCC)  09/15/2019 Initial Diagnosis   Malignant neoplasm of urinary bladder (HCC)   08/05/2023 Cancer Staging   Staging form: Urinary Bladder, AJCC 8th Edition - Clinical: Stage 0a (rcTa, cN0, cM0) - Signed by Lowanda Ruddy, MD on 08/05/2023 Stage prefix: Recurrence WHO/ISUP grade (low/high): High Grade Histologic grading system: 2 grade system   09/16/2023 - 09/16/2023 Chemotherapy   Patient is on Treatment Plan : BLADDER Pembrolizumab  (200) q21d     10/01/2023 Pathology Results   Presented with hematuria and underwent TURBT  A. BLADDER TUMOR, TURBT:  Noninvasive high grade papillary urothelial carcinoma  Muscularis propria (detrusor muscle) is not present    02/04/2024 Pathology Results   pT1 N0 0.9 cm High grade papillary urothelial carcinoma involving left residual ureter. The carcinoma invades subepithelial connective tissue (pT1)  4 negative lymph nodes for malignancy All margins negative   03/03/2024 Cancer Staging   Staging form: Urinary Bladder, AJCC 8th Edition - Pathologic: Stage I (pT1, pN0, cM0) - Signed by Lowanda Ruddy, MD on 03/03/2024 WHO/ISUP grade (low/high): High Grade Histologic grading system: 2 grade system Residual tumor (R): R0   Prostatic adenocarcinoma (HCC)  02/04/2024 Initial Diagnosis  Prostatic adenocarcinoma (HCC) Found from cystoprostatectomy from bladder cancer. pT3a +EPE. Margins negative. GS 4+3=7 (grade group 3)    03/03/2024 Cancer Staging   Staging form:  Prostate, AJCC 8th Edition - Clinical: Stage IIIB (cT3a, cN0, cM0, Grade Group: 3) - Signed by Lowanda Ruddy, MD on 03/03/2024 Gleason score: 7 Histologic grading system: 5 grade system      PHYSICAL EXAMINATION: ECOG PERFORMANCE STATUS: {CHL ONC ECOG PS:[id number]}  There were no vitals filed for this visit. There were no vitals filed for this visit.  GENERAL: alert, no distress and comfortable SKIN: skin color normal and no bruising or petechiae or jaundice on exposed skin EYES: normal, sclera clear OROPHARYNX: no exudate  NECK: No palpable mass LYMPH:  no palpable cervical, axillary lymphadenopathy  LUNGS: clear to auscultation and percussion with normal breathing effort HEART: regular rate & rhythm  ABDOMEN: abdomen soft, non-tender and nondistended. Musculoskeletal: no edema NEURO: no focal motor/sensory deficits  Relevant data reviewed during this visit included ***

## 2024-04-04 ENCOUNTER — Inpatient Hospital Stay

## 2024-04-04 VITALS — BP 122/62 | HR 90 | Temp 97.5°F | Resp 16 | Ht 68.0 in | Wt 171.7 lb

## 2024-04-04 DIAGNOSIS — C679 Malignant neoplasm of bladder, unspecified: Secondary | ICD-10-CM

## 2024-04-04 DIAGNOSIS — E538 Deficiency of other specified B group vitamins: Secondary | ICD-10-CM | POA: Diagnosis not present

## 2024-04-04 DIAGNOSIS — N184 Chronic kidney disease, stage 4 (severe): Secondary | ICD-10-CM | POA: Insufficient documentation

## 2024-04-04 DIAGNOSIS — C678 Malignant neoplasm of overlapping sites of bladder: Secondary | ICD-10-CM | POA: Diagnosis not present

## 2024-04-04 DIAGNOSIS — N179 Acute kidney failure, unspecified: Secondary | ICD-10-CM

## 2024-04-04 DIAGNOSIS — C61 Malignant neoplasm of prostate: Secondary | ICD-10-CM | POA: Diagnosis not present

## 2024-04-04 DIAGNOSIS — H6123 Impacted cerumen, bilateral: Secondary | ICD-10-CM | POA: Diagnosis not present

## 2024-04-04 LAB — BASIC METABOLIC PANEL - CANCER CENTER ONLY
Anion gap: 11 (ref 5–15)
BUN: 56 mg/dL — ABNORMAL HIGH (ref 8–23)
CO2: 21 mmol/L — ABNORMAL LOW (ref 22–32)
Calcium: 9.9 mg/dL (ref 8.9–10.3)
Chloride: 109 mmol/L (ref 98–111)
Creatinine: 3.88 mg/dL — ABNORMAL HIGH (ref 0.61–1.24)
GFR, Estimated: 14 mL/min — ABNORMAL LOW (ref >60)
Glucose, Bld: 99 mg/dL (ref 70–99)
Potassium: 4.5 mmol/L (ref 3.5–5.1)
Sodium: 141 mmol/L (ref 135–145)

## 2024-04-04 NOTE — Assessment & Plan Note (Addendum)
 Will not need additional therapy for resected T1N0 disease. Has follow up with Urology coming up.

## 2024-04-04 NOTE — Assessment & Plan Note (Addendum)
 Will follow up with Urology for surveillance in the future

## 2024-04-04 NOTE — Assessment & Plan Note (Addendum)
 Cr appears between 3-4 with good urine output Continue follow up with nephrology Will follow up with us  in about 4 months

## 2024-04-05 ENCOUNTER — Ambulatory Visit: Admitting: Physical Therapy

## 2024-04-05 ENCOUNTER — Encounter: Payer: Self-pay | Admitting: Physical Therapy

## 2024-04-05 DIAGNOSIS — M6281 Muscle weakness (generalized): Secondary | ICD-10-CM | POA: Diagnosis not present

## 2024-04-05 DIAGNOSIS — M25551 Pain in right hip: Secondary | ICD-10-CM | POA: Diagnosis not present

## 2024-04-05 DIAGNOSIS — R2689 Other abnormalities of gait and mobility: Secondary | ICD-10-CM | POA: Diagnosis not present

## 2024-04-05 NOTE — Therapy (Signed)
 OUTPATIENT PHYSICAL THERAPY LOWER EXTREMITY TREATMENT   Patient Name: [name] [name] MRN: [id number] DOB:[birth year], 89 y.o., male Today's Date: 04/05/2024   END OF SESSION:  PT End of Session - 04/05/24 1051     Visit Number 9    Number of Visits 16    Date for PT Re-Evaluation 04/20/24    Authorization Type [employer] medicare    PT Start Time 1055    PT Stop Time 1135    PT Time Calculation (min) 40 min    Activity Tolerance Patient tolerated treatment well    Behavior During Therapy WFL for tasks assessed/performed               Past Medical History:  Diagnosis Date   Anemia    Arthritis    lower back bulging disc, hips, knees, thumbs, shoulder   Asthma    as a pre teen   Bladder cancer (HCC) UROLOGIST-  DR MCKENZIE   S/P TURBT 08-16-2017   Bladder wall hemorrhage 09/28/2023   BPH (benign prostatic hyperplasia)    CKD (chronic kidney disease), stage III (HCC)    STAGE 3  B CKD per lov 10-16-2020 dr Cathalene Clipper Seretha Dance kidney on chart   Dyspnea    Fatigue    MIDDLE OF DAY ON OCCASION   Frequency of urination    GERD (gastroesophageal reflux disease)    on protonix    Hematuria    Hematuria    History of colon polyps    HLD (hyperlipidemia)    HOH (hard of hearing)    slightly hoh right worse thanleft   HTN (hypertension)    Hypothyroidism    Incontinence of urine    weras pads all the time   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   Right Buccal Cheek   Pre-diabetes    Recurrent bladder papillary carcinoma (HCC) hx 2012 and 2014-- urologist- dr Claretta Croft   s/p  TURBT 08-16-2017  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   Renal mass 09/02/2022   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 10/02/2023   Sleep apnea    uses cpap set on 20 /4   Trigger finger of left hand 07/04/2021   last few months per pt   UTI (urinary tract infection) 08/20/2020   Weakness of extremity    left knee   Wears glasses    Past  Surgical History:  Procedure Laterality Date   BCG X 2     AFTER LAST 2 BLADDER TUMOR REMOVALS   CATARACT EXTRACTION, BILATERAL     COLONSCOPY  YRS AGO   CYSTOSCOPY N/A 10/29/2020   Procedure: CYSTOSCOPY;  Surgeon: Roxane Copp, MD;  Location: [facility];  Service: Urology;  Laterality: N/A;   CYSTOSCOPY N/A 07/08/2021   Procedure: CYSTOSCOPY;  Surgeon: Roxane Copp, MD;  Location: [facility];  Service: Urology;  Laterality: N/A;   CYSTOSCOPY W/ RETROGRADES Right 07/21/2023   Procedure: CYSTOSCOPY WITH RIGHT RETROGRADE PYELOGRAM;  Surgeon: Melody Spurling., MD;  Location: [facility];  Service: Urology;  Laterality: Right;   CYSTOSCOPY W/ URETERAL STENT PLACEMENT Bilateral 08/16/2017   Procedure: CYSTOSCOPY WITH RETROGRADE PYELOGRAM/URETERAL STENT PLACEMENT;  Surgeon: Marco Severs, MD;  Location: [facility];  Service: Urology;  Laterality: Bilateral;   CYSTOSCOPY W/ URETERAL STENT PLACEMENT Left 09/02/2022   Procedure: CYSTOSCOPY WITH RETROGRADE PYELOGRAM, URETERAL STENT PLACEMENT;  Surgeon: Osborn Blaze, MD;  Location: [facility];  Service: Urology;  Laterality:  Left;   CYSTOSCOPY WITH BIOPSY N/A 06/25/2020   Procedure: CYSTOSCOPY FULGURATION   BLADDER  BIOPSY;  Surgeon: Roxane Copp, MD;  Location: [facility];  Service: Urology;  Laterality: N/A;   CYSTOSCOPY WITH FULGERATION N/A 12/08/2019   Procedure: CYSTOSCOPY WITH FULGERATION;  Surgeon: Marco Severs, MD;  Location: [facility];  Service: Urology;  Laterality: N/A;   CYSTOSCOPY WITH FULGERATION N/A 03/24/2022   Procedure: CYSTOSCOPY WITH FULGERATION;  Surgeon: Roxane Copp, MD;  Location: [facility];  Service: Urology;  Laterality: N/A;   CYSTOSCOPY WITH RETROGRADE PYELOGRAM, URETEROSCOPY AND STENT PLACEMENT Left 03/24/2022   Procedure: CYSTOSCOPY WITH RETROGRADE PYELOGRAM, DIAGNOSTIC URETEROSCOPY WITH BIOPSY AND STENT PLACEMENT;   Surgeon: Roxane Copp, MD;  Location: [facility];  Service: Urology;  Laterality: Left;  90 MINUTES   CYSTOSCOPY WITH RETROGRADE PYELOGRAM, URETEROSCOPY AND STENT PLACEMENT Left 04/07/2022   Procedure: CYSTOSCOPY WITH  RETROGRADE PYELOGRAM, DIAGNOSTIC URETEROSCOPY WITH BIOPSY AND STENT EXCHANGE;  Surgeon: Roxane Copp, MD;  Location: [facility];  Service: Urology;  Laterality: Left;  75 MINUTES   CYSTOSCOPY WITH RETROGRADE PYELOGRAM, URETEROSCOPY AND STENT PLACEMENT Left 08/11/2022   Procedure: CYSTOSCOPY WITH RETROGRADE PYELOGRAM, ANTEGRADE NEPHROSTOGRAM, DIAGNOSTIC  URETEROSCOPY;  Surgeon: Roxane Copp, MD;  Location: [facility];  Service: Urology;  Laterality: Left;  90 MINS   HOLMIUM LASER APPLICATION Left 08/11/2022   Procedure: HOLMIUM LASER APPLICATION;  Surgeon: Roxane Copp, MD;  Location: [facility];  Service: Urology;  Laterality: Left;   IR NEPHROSTOMY EXCHANGE LEFT  05/15/2022   IR NEPHROSTOMY EXCHANGE LEFT  07/10/2022   IR NEPHROSTOMY PLACEMENT LEFT  05/06/2022   KNEE ARTHROSCOPY Left 06/10/2011   LYMPH NODE DISSECTION Left 09/02/2022   Procedure: RETROPERITONEAL LYMPH NODE DISSECTION;  Surgeon: Osborn Blaze, MD;  Location: [facility];  Service: Urology;  Laterality: Left;   PROSTATE BIOPSY  2000   ROBOT ASSITED LAPAROSCOPIC NEPHROURETERECTOMY Left 09/02/2022   Procedure: XI ROBOT ASSITED LAPAROSCOPIC NEPHROURETERECTOMY;  Surgeon: Osborn Blaze, MD;  Location: [facility];  Service: Urology;  Laterality: Left;   ROBOT LAP RADICAL CYSTOPROSTATECTOMY PELVIC LYMPHADENECTOMY, NEOBLADDER N/A 02/04/2024   Procedure: ROBOT ASSISTED LAPAROSCOPIC RADICAL CYSTOPROSTATECTOMY;  Surgeon: Melody Spurling., MD;  Location: [facility];  Service: Urology;  Laterality: N/A;  300 MINUTES NEEDED FOR CASE   TRANSURETHRAL RESECTION OF BLADDER TUMOR N/A 08/16/2017   Procedure: TRANSURETHRAL RESECTION OF BLADDER TUMOR (TURBT);  Surgeon: Marco Severs, MD;  Location: [facility];  Service:  Urology;  Laterality: N/A;   TRANSURETHRAL RESECTION OF BLADDER TUMOR  12-09-2012   dr Lavona Pounds [facility]   and TURP   TRANSURETHRAL RESECTION OF BLADDER TUMOR N/A 09/20/2017   Procedure: TRANSURETHRAL RESECTION OF BLADDER TUMOR (TURBT);  Surgeon: Marco Severs, MD;  Location: [facility];  Service: Urology;  Laterality: N/A;   TRANSURETHRAL RESECTION OF BLADDER TUMOR N/A 12/08/2019   Procedure: TRANSURETHRAL RESECTION OF BLADDER TUMOR;  Surgeon: Marco Severs, MD;  Location: [facility];  Service: Urology;  Laterality: N/A;  1 HR   TRANSURETHRAL RESECTION OF BLADDER TUMOR N/A 10/29/2020   Procedure: BLADDER FULGERATION;  Surgeon: Roxane Copp, MD;  Location: [facility];  Service: Urology;  Laterality: N/A;  30 MINS   TRANSURETHRAL RESECTION OF BLADDER TUMOR N/A 07/08/2021   Procedure: TRANSURETHRAL RESECTION OF BLADDER TUMOR (TURBT)/FULGERATION;  Surgeon: Roxane Copp, MD;  Location: [facility];  Service: Urology;  Laterality: N/A;  45 MINS   TRANSURETHRAL RESECTION OF BLADDER TUMOR N/A 12/02/2021   Procedure: TRANSURETHRAL RESECTION OF BLADDER TUMOR (TURBT);  Surgeon: Roxane Copp, MD;  Location: [facility];  Service: Urology;  Laterality: N/A;   TRANSURETHRAL RESECTION OF BLADDER TUMOR N/A 07/21/2023   Procedure: TRANSURETHRAL RESECTION OF BLADDER TUMOR (TURBT);  Surgeon: Melody Spurling., MD;  Location: [facility];  Service: Urology;  Laterality: N/A;  60 MINUTES   TRANSURETHRAL RESECTION OF BLADDER TUMOR N/A 10/01/2023   Procedure: TRANSURETHRAL RESECTION OF BLADDER TUMOR (TURBT)/CLOT EVACUATION WITH FULGERATION;  Surgeon: Melody Spurling., MD;  Location: [facility];  Service: Urology;  Laterality: N/A;   TRANSURETHRAL RESECTION OF PROSTATE  2011   UPPER GI ENDOSCOPY  YRS AGO   URETEROSCOPY Right 02/04/2024   Procedure: RIGHT CUTANEOUS URETEROSCOPY;  Surgeon: Melody Spurling., MD;  Location: [facility];   Service: Urology;  Laterality: Right;   Patient Active Problem List   Diagnosis Date Noted   Chronic right hip pain 03/20/2024   Prostatic adenocarcinoma (HCC) 03/03/2024   UTI (urinary tract infection) 03/02/2024   Complicated UTI (urinary tract infection) 03/02/2024   Ureterostomy status (HCC) 02/15/2024   History of iron deficiency 12/20/2023   Dysuria 10/15/2023   AKI (acute kidney injury) (HCC) 10/08/2023   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 10/02/2023   Decreased appetite 09/28/2023   Hardening of the aorta (main artery of the heart) (HCC) 09/28/2023   Mixed hyperlipidemia 09/28/2023   Prediabetes 09/28/2023   Panic attack 09/28/2023   Pedal edema 09/28/2023   Osteoarthritis 09/28/2023   Sinusitis chronic, sphenoidal 09/28/2023   Acute blood loss anemia (ABLA) 09/28/2023   Hypothyroidism 09/28/2023   Low serum vitamin B12 09/10/2023   Anemia of chronic disease 09/10/2023   Normocytic anemia 08/06/2023   Unintentional weight loss 04/09/2021   Leg weakness, bilateral 04/09/2021   Surgical counseling visit 04/09/2021   Essential hypertension 05/23/2020   Sleep apnea treated with nocturnal BiPAP 01/25/2020   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   Hypersomnia with sleep apnea 09/15/2019   Cardiac arrhythmia due to premature depolarization 09/15/2019   Excessive daytime sleepiness 09/15/2019   CKD (chronic kidney disease) stage 4, GFR 15-29 ml/min (HCC) - Baseline Scr 3.0 09/15/2019   Malignant neoplasm of urinary bladder (HCC) 09/15/2019   Gross hematuria 09/11/2017   Benign prostatic hyperplasia without lower urinary tract symptoms 12/09/2012   GERD (gastroesophageal reflux disease) 11/28/2012     PCP: Virgle Grime  REFERRING PROVIDER: Virgle Grime  REFERRING DIAG: Leg weakness   THERAPY DIAG:  Pain in right hip  Muscle weakness (generalized)  Other abnormalities of gait and mobility  Rationale for Evaluation and Treatment:  Rehabilitation  ONSET DATE:    SUBJECTIVE:   SUBJECTIVE STATEMENT:  Pt states hip continues to be sore, and limiting his walking and mobility. He did get a script for pain medicine yesterday, has not started taking it yet.   Eval:  Pt states weakness in legs R>L ongoing for a while. He had significant weakness previously from medication issues but has slightly improved. He states pain in his R hip, sharp pains with twisting at times, and instability feeling with walking and activity due to hip pain.He has had previous L knee surgery, states R hip OA, and has had recent surgery for cancer, with bladder and prostate removal. Since then he states feeling pretty good, he does have swelling in feet and legs, just saw Dr this week who was not concerned.  He is wearing sandals today, with obvious swelling in feet and legs.  He used to walk about 2 mi/day in past years, recently he did walk about 1.5 miles States minimal fatigue with endurance, but more limitation from R hip and leg weakness.  He is currently living with son and his wife in split level house. States doing ok with stairs for now. Has walking stick, but not using today, does usually use most of the time.    PERTINENT HISTORY: Recent bladder and prostate removal for CA, CKD,  Nephrectomy (L),    PAIN:  Are you having pain? Yes: NPRS scale: 5-6/10    Pain location: R hip/anterior/lateral Pain description: sore, stiff, shooting with twisting motion Aggravating factors: increased activity , initial standing  Relieving factors: none stated   PRECAUTIONS: None  WEIGHT BEARING RESTRICTIONS: No  FALLS:  Has patient fallen in last 6 months? No  PLOF: Independent  PATIENT GOALS:  Decreased pain in hip, improve walking and balance.   NEXT MD VISIT:   OBJECTIVE:   DIAGNOSTIC FINDINGS:   PATIENT SURVEYS:    COGNITION: Overall cognitive status: Within functional limits for tasks assessed     SENSATION: WFL  EDEMA:  mod/significant edema in bil lower legs and feet.    POSTURE:    No Significant postural limitations  PALPATION:     LOWER EXTREMITY ROM: Hips:  L: mild limitation for flex, ER, mod for IR,   R:  mod limitation for all motions.   LOWER EXTREMITY MMT:  MMT Left eval Right  eval Left:  5/2 Right 5/2  Hip flexion 4- 3- 4 3 /3-  Hip extension      Hip abduction      Hip adduction      Hip internal rotation      Hip external rotation      Knee flexion 4 4    Knee extension 4+ 4+    Ankle dorsiflexion      Ankle plantarflexion      Ankle inversion      Ankle eversion       (Blank rows = not tested)  LOWER EXTREMITY SPECIAL TESTS:    FUNCTIONAL TESTS:    GAIT: Distance walked: 237ft Assistive device utilized: Walker - 2 wheeled Level of assistance: Complete Independence and Modified independence Comments: decreased step height when R hip painful.    TODAY'S TREATMENT:                                                                                                                              DATE:   04/05/24: Therapeutic Exercise: Aerobic: Supine:   hip ER rom x 10;  SAQ 3lb 2 x 10 bil; march x 12; LTR x 15;  PROM R hip, all motions.  Seated:     Standing:  heel raises 2 x 10;   Stretches:    Neuromuscular Re-education: Manual Therapy:  LAD R hip;  Therapeutic  Activity: Sit to stand 2 x 5 ; mat table,slight use of hands   stairs: up/down 5 steps x 3 with 1 ue support and cane;  Self Care:   Gait:      PATIENT EDUCATION:  Education details: updated and reviewed HEP Person educated: Patient Education method: Explanation, Demonstration, Tactile cues, Verbal cues, and Handouts Education comprehension: verbalized understanding, returned demonstration, verbal cues required, tactile cues required, and needs further education   HOME EXERCISE PROGRAM: Access Code: [email or web address]  ASSESSMENT:  CLINICAL IMPRESSION: Pt with good tolerance for activities today. He is  limited with progression of standing strengthening due to pain in hip. He continues to be complaint with HEP, feels good at the time of movement, but has not gotten overall/lasting pain relief. He is seeing Ortho next week, will see pt back after his appt to discuss plan.   Eval: Patient presents with primary complaint of  pain in R hip, and weakness in R hip/LE. He has stiffness in bil hips, R>L, with lack of ROM. He has decreased strength in R hip vs L, with feelings of instability with activity. He has decreased gait mechanics, and will benefit from education on use of AD. He has decreased ability and stability with functional activity, gait, and stairs. He is overall deconditioned and will benefit from education on HEP . Pt with decreased ability for full functional activities. Pt will  benefit from skilled PT to improve deficits and pain and to return to PLOF.   OBJECTIVE IMPAIRMENTS: Abnormal gait, decreased activity tolerance, decreased balance, decreased knowledge of use of DME, decreased mobility, difficulty walking, decreased ROM, decreased strength, decreased safety awareness, impaired flexibility, improper body mechanics, and pain.   ACTIVITY LIMITATIONS: bending, standing, squatting, stairs, transfers, and locomotion level  PARTICIPATION LIMITATIONS: meal prep, cleaning, shopping, and community activity  PERSONAL FACTORS: Age, Past/current experiences, Time since onset of injury/illness/exacerbation, and 1-2 comorbidities: CA, recent surgery, OA are also affecting patient's functional outcome.   REHAB POTENTIAL: Good  CLINICAL DECISION MAKING: Evolving/moderate complexity  EVALUATION COMPLEXITY: Moderate   GOALS: Goals reviewed with patient? Yes  SHORT TERM GOALS: Target date: 03/16/2024  Pt to be independent with initial HEP  Goal status: MET   2.  Pt to demo independent transfer from regular chair, without use of UEs.   Goal status: MET    LONG TERM GOALS: Target date:  04/20/2024  Pt to be independent with final HEP  Goal status: in progress  2.  Pt to demo improved strength of R hip to at least 4/5 for flex and abd, to improve ability and stability for gait.   Goal status: in progress  3.  Pt to report decreased pain to 0-2/10 in R hip with activity.   Goal status: in progress  4.  Pt to demo optimal mechanics and safety  for gait and stairs, with LRAD, to improve ability and safety with community activity.  Goal status: in progress    PLAN:  PT FREQUENCY: 1-2x/week  PT DURATION: 8 weeks  PLANNED INTERVENTIONS: Therapeutic exercises, Therapeutic activity, Neuromuscular re-education, Patient/Family education, Self Care, Joint mobilization, Joint manipulation, Stair training, Orthotic/Fit training, DME instructions, Aquatic Therapy, Dry Needling, Electrical stimulation, Cryotherapy, Moist heat, Taping, Ultrasound, Ionotophoresis 4mg /ml Dexamethasone , Manual therapy,  Vasopneumatic device, Traction, Spinal manipulation, Spinal mobilization,Balance training, Gait training,   PLAN FOR NEXT SESSION: stairs - 8 steps split level-    Terrilee Few, PT, DPT 10:52 AM  04/05/24

## 2024-04-07 ENCOUNTER — Encounter: Admitting: Physical Therapy

## 2024-04-10 ENCOUNTER — Ambulatory Visit: Admitting: Physician Assistant

## 2024-04-12 ENCOUNTER — Ambulatory Visit: Admitting: Orthopaedic Surgery

## 2024-04-12 ENCOUNTER — Other Ambulatory Visit (INDEPENDENT_AMBULATORY_CARE_PROVIDER_SITE_OTHER)

## 2024-04-12 ENCOUNTER — Encounter: Payer: Self-pay | Admitting: Orthopaedic Surgery

## 2024-04-12 DIAGNOSIS — M1611 Unilateral primary osteoarthritis, right hip: Secondary | ICD-10-CM | POA: Diagnosis not present

## 2024-04-12 NOTE — Progress Notes (Signed)
 Office Visit Note   Patient: [name] [name]           Date of Birth: [birth year]           MRN: [id number] Visit Date: 04/12/2024              Requested by: Syliva Even, MD [address] [place],  [place] [address] PCP: Virgle Grime, MD   Assessment & Plan: Visit Diagnoses:  1. Primary osteoarthritis of right hip     Plan: Given patient's severe right hip arthritis and the fact that is affecting his quality of life it is felt that he could benefit from right total hip arthroplasty.  History of right total hip arthroplasty anterior approach.  However given his comorbidities he would need cardiac and medical clearance prior to proceeding with surgery.  Risk benefits of surgery discussed.  Postop protocol discussed.  Risk include but are not limited to DVT/PE, wound healing problems, infection, leg length discrepancy, blood loss and nerve vessel injury.  Surgical sheet was filled out heat we will need clearance prior to scheduling though this is discussed with the patient and his son who is present today.  Risk benefits postoperative protocol all discussed by Dr. Lucienne Ryder and myself with the patient and his son.  Handout on anterior hip arthroplasty was given.  Hip models were shown to the patient.  Follow-Up Instructions: Return for post op.   Orders:  Orders Placed This Encounter  Procedures   XR HIP UNILAT W OR W/O PELVIS 1V RIGHT   No orders of the defined types were placed in this encounter.     Procedures: No procedures performed   Clinical Data: No additional findings.   Subjective: Chief Complaint  Patient presents with   Right Hip - Pain    HPI Patient is 89 year old male were seen for the first time for right hip pain.  He ambulates with a walker due to right hip pain which is constant 4-5 out of 10 and short-lived 9 out of 10 pain.  Pain is mostly buttocks area.  He states he started limping about a year ago.  Notes that his right leg is now  shorter than the left.  Began using walker recently due to feeling that his knee is unstable.  He is unable to walk long distances for exercise.  A year ago he reports he was walking approximately a mile and is unable to walk more than 1/4 mile now due to the hip pain and weakness in the legs.  Medical history pertinent for sleep apnea, hypertension, normocytic anemia, chronic kidney disease stage IV reported to be stable, hypothyroidism, history of bladder cancer status post cystectomy and prostatectomy.  Ureterostomy in place.  Review of Systems  Constitutional:  Negative for chills and fever.  Cardiovascular:  Negative for chest pain.  Neurological:  Positive for weakness.     Objective: Vital Signs: There were no vitals taken for this visit.  Physical Exam Constitutional:      Appearance: He is not diaphoretic.  Pulmonary:     Effort: Pulmonary effort is normal.  Neurological:     Mental Status: He is alert and oriented to person, place, and time.  Psychiatric:        Mood and Affect: Mood normal.     Ortho Exam Bilateral lower extremity: Left hip no pain with internal/external rotation but slight limitation of motion.  Right hip significantly decreased internal and external rotation with pain.  Bilateral lower  extremities with pitting edema. Specialty Comments:  No specialty comments available.  Imaging: XR HIP UNILAT W OR W/O PELVIS 1V RIGHT Result Date: 04/12/2024 AP pelvis lateral view right hip: Bilateral hips well located.  Moderate joint space narrowing left hip.  No acute fractures about his left hip.  Right hip with end-stage arthritic changes and cystic changes within the femoral head.  No acute fractures.  No bony lesions.    PMFS History: Patient Active Problem List   Diagnosis Date Noted   Chronic right hip pain 03/20/2024   Prostatic adenocarcinoma (HCC) 03/03/2024   UTI (urinary tract infection) 03/02/2024   Complicated UTI (urinary tract infection)  03/02/2024   Ureterostomy status (HCC) 02/15/2024   History of iron deficiency 12/20/2023   Dysuria 10/15/2023   AKI (acute kidney injury) (HCC) 10/08/2023   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 10/02/2023   Decreased appetite 09/28/2023   Hardening of the aorta (main artery of the heart) (HCC) 09/28/2023   Mixed hyperlipidemia 09/28/2023   Prediabetes 09/28/2023   Panic attack 09/28/2023   Pedal edema 09/28/2023   Osteoarthritis 09/28/2023   Sinusitis chronic, sphenoidal 09/28/2023   Acute blood loss anemia (ABLA) 09/28/2023   Hypothyroidism 09/28/2023   Low serum vitamin B12 09/10/2023   Anemia of chronic disease 09/10/2023   Normocytic anemia 08/06/2023   Unintentional weight loss 04/09/2021   Leg weakness, bilateral 04/09/2021   Surgical counseling visit 04/09/2021   Essential hypertension 05/23/2020   Sleep apnea treated with nocturnal BiPAP 01/25/2020   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   Hypersomnia with sleep apnea 09/15/2019   Cardiac arrhythmia due to premature depolarization 09/15/2019   Excessive daytime sleepiness 09/15/2019   CKD (chronic kidney disease) stage 4, GFR 15-29 ml/min (HCC) - Baseline Scr 3.0 09/15/2019   Malignant neoplasm of urinary bladder (HCC) 09/15/2019   Gross hematuria 09/11/2017   Benign prostatic hyperplasia without lower urinary tract symptoms 12/09/2012   GERD (gastroesophageal reflux disease) 11/28/2012   Past Medical History:  Diagnosis Date   Anemia    Arthritis    lower back bulging disc, hips, knees, thumbs, shoulder   Asthma    as a pre teen   Bladder cancer (HCC) UROLOGIST-  DR MCKENZIE   S/P TURBT 08-16-2017   Bladder wall hemorrhage 09/28/2023   BPH (benign prostatic hyperplasia)    CKD (chronic kidney disease), stage III (HCC)    STAGE 3  B CKD per lov 10-16-2020 dr Cathalene Clipper Seretha Dance kidney on chart   Dyspnea    Fatigue    MIDDLE OF DAY ON OCCASION   Frequency of urination    GERD  (gastroesophageal reflux disease)    on protonix    Hematuria    Hematuria    History of colon polyps    HLD (hyperlipidemia)    HOH (hard of hearing)    slightly hoh right worse thanleft   HTN (hypertension)    Hypothyroidism    Incontinence of urine    weras pads all the time   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   Right Buccal Cheek   Pre-diabetes    Recurrent bladder papillary carcinoma (HCC) hx 2012 and 2014-- urologist- dr Claretta Croft   s/p  TURBT 08-16-2017  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   Renal mass 09/02/2022   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 10/02/2023   Sleep apnea    uses cpap set on 20 /4   Trigger finger of left hand 07/04/2021  last few months per pt   UTI (urinary tract infection) 08/20/2020   Weakness of extremity    left knee   Wears glasses     Family History  Problem Relation Age of Onset   Hypertension Mother    Breast cancer Mother    Other Mother        brain tumor   Alzheimer's disease Mother    Emphysema Father        smoker    Past Surgical History:  Procedure Laterality Date   BCG X 2     AFTER LAST 2 BLADDER TUMOR REMOVALS   CATARACT EXTRACTION, BILATERAL     COLONSCOPY  YRS AGO   CYSTOSCOPY N/A 10/29/2020   Procedure: CYSTOSCOPY;  Surgeon: Roxane Copp, MD;  Location: [facility];  Service: Urology;  Laterality: N/A;   CYSTOSCOPY N/A 07/08/2021   Procedure: CYSTOSCOPY;  Surgeon: Roxane Copp, MD;  Location: [facility];  Service: Urology;  Laterality: N/A;   CYSTOSCOPY W/ RETROGRADES Right 07/21/2023   Procedure: CYSTOSCOPY WITH RIGHT RETROGRADE PYELOGRAM;  Surgeon: Melody Spurling., MD;  Location: [facility];  Service: Urology;  Laterality: Right;   CYSTOSCOPY W/ URETERAL STENT PLACEMENT Bilateral 08/16/2017   Procedure: CYSTOSCOPY WITH RETROGRADE PYELOGRAM/URETERAL STENT PLACEMENT;  Surgeon: Marco Severs, MD;  Location: [facility];  Service: Urology;  Laterality: Bilateral;   CYSTOSCOPY W/ URETERAL STENT PLACEMENT Left 09/02/2022   Procedure: CYSTOSCOPY WITH RETROGRADE PYELOGRAM, URETERAL STENT PLACEMENT;  Surgeon: Osborn Blaze, MD;  Location: [facility];  Service: Urology;  Laterality: Left;   CYSTOSCOPY WITH BIOPSY N/A 06/25/2020   Procedure: CYSTOSCOPY FULGURATION   BLADDER  BIOPSY;  Surgeon: Roxane Copp, MD;  Location: [facility];  Service: Urology;  Laterality: N/A;   CYSTOSCOPY WITH FULGERATION N/A 12/08/2019   Procedure: CYSTOSCOPY WITH FULGERATION;  Surgeon: Marco Severs, MD;  Location: [facility];  Service: Urology;  Laterality: N/A;   CYSTOSCOPY WITH FULGERATION N/A 03/24/2022   Procedure: CYSTOSCOPY WITH FULGERATION;  Surgeon: Roxane Copp, MD;  Location: [facility];  Service: Urology;  Laterality: N/A;   CYSTOSCOPY WITH RETROGRADE PYELOGRAM, URETEROSCOPY AND STENT PLACEMENT Left 03/24/2022   Procedure: CYSTOSCOPY WITH RETROGRADE PYELOGRAM, DIAGNOSTIC URETEROSCOPY WITH BIOPSY AND STENT PLACEMENT;  Surgeon: Roxane Copp, MD;  Location: [facility];  Service: Urology;  Laterality: Left;  90 MINUTES   CYSTOSCOPY WITH RETROGRADE PYELOGRAM, URETEROSCOPY AND STENT PLACEMENT Left 04/07/2022   Procedure: CYSTOSCOPY WITH  RETROGRADE PYELOGRAM, DIAGNOSTIC URETEROSCOPY WITH BIOPSY AND STENT EXCHANGE;  Surgeon: Roxane Copp, MD;  Location: [facility];  Service: Urology;  Laterality: Left;  75 MINUTES   CYSTOSCOPY WITH RETROGRADE PYELOGRAM, URETEROSCOPY AND STENT PLACEMENT Left 08/11/2022   Procedure: CYSTOSCOPY WITH RETROGRADE PYELOGRAM, ANTEGRADE NEPHROSTOGRAM, DIAGNOSTIC  URETEROSCOPY;  Surgeon: Roxane Copp, MD;  Location: [facility];  Service: Urology;  Laterality: Left;  90 MINS   HOLMIUM LASER APPLICATION Left 08/11/2022   Procedure: HOLMIUM LASER APPLICATION;  Surgeon: Roxane Copp, MD;  Location: [facility];  Service: Urology;  Laterality: Left;   IR NEPHROSTOMY  EXCHANGE LEFT  05/15/2022   IR NEPHROSTOMY EXCHANGE LEFT  07/10/2022   IR NEPHROSTOMY PLACEMENT LEFT  05/06/2022   KNEE ARTHROSCOPY Left 06/10/2011   LYMPH NODE DISSECTION Left 09/02/2022   Procedure: RETROPERITONEAL LYMPH NODE DISSECTION;  Surgeon: Osborn Blaze, MD;  Location: [facility];  Service: Urology;  Laterality: Left;   PROSTATE BIOPSY  2000  ROBOT ASSITED LAPAROSCOPIC NEPHROURETERECTOMY Left 09/02/2022   Procedure: XI ROBOT ASSITED LAPAROSCOPIC NEPHROURETERECTOMY;  Surgeon: Osborn Blaze, MD;  Location: [facility];  Service: Urology;  Laterality: Left;   ROBOT LAP RADICAL CYSTOPROSTATECTOMY PELVIC LYMPHADENECTOMY, NEOBLADDER N/A 02/04/2024   Procedure: ROBOT ASSISTED LAPAROSCOPIC RADICAL CYSTOPROSTATECTOMY;  Surgeon: Melody Spurling., MD;  Location: [facility];  Service: Urology;  Laterality: N/A;  300 MINUTES NEEDED FOR CASE   TRANSURETHRAL RESECTION OF BLADDER TUMOR N/A 08/16/2017   Procedure: TRANSURETHRAL RESECTION OF BLADDER TUMOR (TURBT);  Surgeon: Marco Severs, MD;  Location: [facility];  Service: Urology;  Laterality: N/A;   TRANSURETHRAL RESECTION OF BLADDER TUMOR  12-09-2012   dr Lavona Pounds [facility]   and TURP   TRANSURETHRAL RESECTION OF BLADDER TUMOR N/A 09/20/2017   Procedure: TRANSURETHRAL RESECTION OF BLADDER TUMOR (TURBT);  Surgeon: Marco Severs, MD;  Location: [facility];  Service: Urology;  Laterality: N/A;   TRANSURETHRAL RESECTION OF BLADDER TUMOR N/A 12/08/2019   Procedure: TRANSURETHRAL RESECTION OF BLADDER TUMOR;  Surgeon: Marco Severs, MD;  Location: [facility];  Service: Urology;  Laterality: N/A;  1 HR   TRANSURETHRAL RESECTION OF BLADDER TUMOR N/A 10/29/2020   Procedure: BLADDER FULGERATION;  Surgeon: Roxane Copp, MD;  Location: [facility];  Service: Urology;  Laterality: N/A;  30 MINS   TRANSURETHRAL RESECTION OF BLADDER TUMOR N/A 07/08/2021   Procedure: TRANSURETHRAL  RESECTION OF BLADDER TUMOR (TURBT)/FULGERATION;  Surgeon: Roxane Copp, MD;  Location: [facility];  Service: Urology;  Laterality: N/A;  45 MINS   TRANSURETHRAL RESECTION OF BLADDER TUMOR N/A 12/02/2021   Procedure: TRANSURETHRAL RESECTION OF BLADDER TUMOR (TURBT);  Surgeon: Roxane Copp, MD;  Location: [facility];  Service: Urology;  Laterality: N/A;   TRANSURETHRAL RESECTION OF BLADDER TUMOR N/A 07/21/2023   Procedure: TRANSURETHRAL RESECTION OF BLADDER TUMOR (TURBT);  Surgeon: Melody Spurling., MD;  Location: [facility];  Service: Urology;  Laterality: N/A;  60 MINUTES   TRANSURETHRAL RESECTION OF BLADDER TUMOR N/A 10/01/2023   Procedure: TRANSURETHRAL RESECTION OF BLADDER TUMOR (TURBT)/CLOT EVACUATION WITH FULGERATION;  Surgeon: Melody Spurling., MD;  Location: [facility];  Service: Urology;  Laterality: N/A;   TRANSURETHRAL RESECTION OF PROSTATE  2011   UPPER GI ENDOSCOPY  YRS AGO   URETEROSCOPY Right 02/04/2024   Procedure: RIGHT CUTANEOUS URETEROSCOPY;  Surgeon: Melody Spurling., MD;  Location: [facility];  Service: Urology;  Laterality: Right;   Social History   Occupational History   Occupation: retired  Tobacco Use   Smoking status: Never   Smokeless tobacco: Never  Vaping Use   Vaping status: Never Used  Substance and Sexual Activity   Alcohol use: Not Currently    Alcohol/week: 1.0 - 2.0 standard drink of alcohol    Types: 1 - 2 Glasses of wine per week    Comment: rare   Drug use: No   Sexual activity: Not Currently

## 2024-04-13 ENCOUNTER — Ambulatory Visit: Admitting: Physical Therapy

## 2024-04-13 ENCOUNTER — Encounter: Payer: Self-pay | Admitting: Physical Therapy

## 2024-04-13 DIAGNOSIS — M6281 Muscle weakness (generalized): Secondary | ICD-10-CM

## 2024-04-13 DIAGNOSIS — M25551 Pain in right hip: Secondary | ICD-10-CM

## 2024-04-13 DIAGNOSIS — R2689 Other abnormalities of gait and mobility: Secondary | ICD-10-CM | POA: Diagnosis not present

## 2024-04-13 NOTE — Therapy (Signed)
 OUTPATIENT PHYSICAL THERAPY LOWER EXTREMITY TREATMENT/PN/Recert   Patient Name: [name] [name] MRN: [id number] DOB:1932/05/15, 89 y.o., male Today's Date: 04/13/2024  Physical Therapy Progress Note  Dates of Reporting Period: 02/24/24    to    04/13/24     END OF SESSION:  PT End of Session - 04/13/24 1018     Visit Number 10    Number of Visits 16    Date for PT Re-Evaluation 04/20/24    Authorization Type [employer] medicare  PN done at visit 10    PT Start Time 1020    PT Stop Time 1059    PT Time Calculation (min) 39 min    Activity Tolerance Patient tolerated treatment well    Behavior During Therapy WFL for tasks assessed/performed               Past Medical History:  Diagnosis Date   Anemia    Arthritis    lower back bulging disc, hips, knees, thumbs, shoulder   Asthma    as a pre teen   Bladder cancer (HCC) UROLOGIST-  DR MCKENZIE   S/P TURBT 08-16-2017   Bladder wall hemorrhage 09/28/2023   BPH (benign prostatic hyperplasia)    CKD (chronic kidney disease), stage III (HCC)    STAGE 3  B CKD per lov 10-16-2020 dr Cathalene Clipper Seretha Dance kidney on chart   Dyspnea    Fatigue    MIDDLE OF DAY ON OCCASION   Frequency of urination    GERD (gastroesophageal reflux disease)    on protonix    Hematuria    Hematuria    History of colon polyps    HLD (hyperlipidemia)    HOH (hard of hearing)    slightly hoh right worse thanleft   HTN (hypertension)    Hypothyroidism    Incontinence of urine    weras pads all the time   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   Right Buccal Cheek   Pre-diabetes    Recurrent bladder papillary carcinoma (HCC) hx 2012 and 2014-- urologist- dr Claretta Croft   s/p  TURBT 08-16-2017  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   Renal mass 09/02/2022   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 10/02/2023   Sleep apnea    uses cpap set on 20 /4   Trigger finger of left hand 07/04/2021   last few  months per pt   UTI (urinary tract infection) 08/20/2020   Weakness of extremity    left knee   Wears glasses    Past Surgical History:  Procedure Laterality Date   BCG X 2     AFTER LAST 2 BLADDER TUMOR REMOVALS   CATARACT EXTRACTION, BILATERAL     COLONSCOPY  YRS AGO   CYSTOSCOPY N/A 10/29/2020   Procedure: CYSTOSCOPY;  Surgeon: Roxane Copp, MD;  Location[facility];  Service: Urology;  Laterality: N/A;   CYSTOSCOPY N/A 07/08/2021   Procedure: CYSTOSCOPY;  Surgeon: Roxane Copp, MD;  Location: [facility];  Service: Urology;  Laterality: N/A;   CYSTOSCOPY W/ RETROGRADES Right 07/21/2023   Procedure: CYSTOSCOPY WITH RIGHT RETROGRADE PYELOGRAM;  Surgeon: Melody Spurling., MD;  Location: [facility];  Service: Urology;  Laterality: Right;   CYSTOSCOPY W/ URETERAL STENT PLACEMENT Bilateral 08/16/2017   Procedure: CYSTOSCOPY WITH RETROGRADE PYELOGRAM/URETERAL STENT PLACEMENT;  Surgeon: Marco Severs, MD;  Location: [facility];  Service: Urology;  Laterality: Bilateral;   CYSTOSCOPY W/ URETERAL STENT  PLACEMENT Left 09/02/2022   Procedure: CYSTOSCOPY WITH RETROGRADE PYELOGRAM, URETERAL STENT PLACEMENT;  Surgeon: Osborn Blaze, MD;  Location: [facility];  Service: Urology;  Laterality: Left;   CYSTOSCOPY WITH BIOPSY N/A 06/25/2020   Procedure: CYSTOSCOPY FULGURATION   BLADDER  BIOPSY;  Surgeon: Roxane Copp, MD;  Location[facility];  Service: Urology;  Laterality: N/A;   CYSTOSCOPY WITH FULGERATION N/A 12/08/2019   Procedure: CYSTOSCOPY WITH FULGERATION;  Surgeon: Marco Severs, MD;  Location: [facility];  Service: Urology;  Laterality: N/A;   CYSTOSCOPY WITH FULGERATION N/A 03/24/2022   Procedure: CYSTOSCOPY WITH FULGERATION;  Surgeon: Roxane Copp, MD;  Location: [facility];  Service: Urology;  Laterality: N/A;   CYSTOSCOPY WITH RETROGRADE PYELOGRAM, URETEROSCOPY AND STENT PLACEMENT Left  03/24/2022   Procedure: CYSTOSCOPY WITH RETROGRADE PYELOGRAM, DIAGNOSTIC URETEROSCOPY WITH BIOPSY AND STENT PLACEMENT;  Surgeon: Roxane Copp, MD;  Location: [facility];  Service: Urology;  Laterality: Left;  90 MINUTES   CYSTOSCOPY WITH RETROGRADE PYELOGRAM, URETEROSCOPY AND STENT PLACEMENT Left 04/07/2022   Procedure: CYSTOSCOPY WITH  RETROGRADE PYELOGRAM, DIAGNOSTIC URETEROSCOPY WITH BIOPSY AND STENT EXCHANGE;  Surgeon: Roxane Copp, MD;  Location: [facility];  Service: Urology;  Laterality: Left;  75 MINUTES   CYSTOSCOPY WITH RETROGRADE PYELOGRAM, URETEROSCOPY AND STENT PLACEMENT Left 08/11/2022   Procedure: CYSTOSCOPY WITH RETROGRADE PYELOGRAM, ANTEGRADE NEPHROSTOGRAM, DIAGNOSTIC  URETEROSCOPY;  Surgeon: Roxane Copp, MD;  Location: [facility];  Service: Urology;  Laterality: Left;  90 MINS   HOLMIUM LASER APPLICATION Left 08/11/2022   Procedure: HOLMIUM LASER APPLICATION;  Surgeon: Roxane Copp, MD;  Location: [facility];  Service: Urology;  Laterality: Left;   IR NEPHROSTOMY EXCHANGE LEFT  05/15/2022   IR NEPHROSTOMY EXCHANGE LEFT  07/10/2022   IR NEPHROSTOMY PLACEMENT LEFT  05/06/2022   KNEE ARTHROSCOPY Left 06/10/2011   LYMPH NODE DISSECTION Left 09/02/2022   Procedure: RETROPERITONEAL LYMPH NODE DISSECTION;  Surgeon: Osborn Blaze, MD;  Location: [facility];  Service: Urology;  Laterality: Left;   PROSTATE BIOPSY  2000   ROBOT ASSITED LAPAROSCOPIC NEPHROURETERECTOMY Left 09/02/2022   Procedure: XI ROBOT ASSITED LAPAROSCOPIC NEPHROURETERECTOMY;  Surgeon: Osborn Blaze, MD;  Location: [facility];  Service: Urology;  Laterality: Left;   ROBOT LAP RADICAL CYSTOPROSTATECTOMY PELVIC LYMPHADENECTOMY, NEOBLADDER N/A 02/04/2024   Procedure: ROBOT ASSISTED LAPAROSCOPIC RADICAL CYSTOPROSTATECTOMY;  Surgeon: Melody Spurling., MD;  Location: [facility];  Service: Urology;  Laterality: N/A;  300 MINUTES NEEDED FOR CASE   TRANSURETHRAL RESECTION OF BLADDER TUMOR N/A 08/16/2017   Procedure: TRANSURETHRAL  RESECTION OF BLADDER TUMOR (TURBT);  Surgeon: Marco Severs, MD;  Location: [facility];  Service: Urology;  Laterality: N/A;   TRANSURETHRAL RESECTION OF BLADDER TUMOR  12-09-2012   dr Lavona Pounds [facility]   and TURP   TRANSURETHRAL RESECTION OF BLADDER TUMOR N/A 09/20/2017   Procedure: TRANSURETHRAL RESECTION OF BLADDER TUMOR (TURBT);  Surgeon: Marco Severs, MD;  Location: [facility];  Service: Urology;  Laterality: N/A;   TRANSURETHRAL RESECTION OF BLADDER TUMOR N/A 12/08/2019   Procedure: TRANSURETHRAL RESECTION OF BLADDER TUMOR;  Surgeon: Marco Severs, MD;  Location: [facility];  Service: Urology;  Laterality: N/A;  1 HR   TRANSURETHRAL RESECTION OF BLADDER TUMOR N/A 10/29/2020   Procedure: BLADDER FULGERATION;  Surgeon: Roxane Copp, MD;  Location: [facility];  Service: Urology;  Laterality: N/A;  30 MINS   TRANSURETHRAL RESECTION OF BLADDER TUMOR N/A 07/08/2021  Procedure: TRANSURETHRAL RESECTION OF BLADDER TUMOR (TURBT)/FULGERATION;  Surgeon: Roxane Copp, MD;  Location: [facility];  Service: Urology;  Laterality: N/A;  45 MINS   TRANSURETHRAL RESECTION OF BLADDER TUMOR N/A 12/02/2021   Procedure: TRANSURETHRAL RESECTION OF BLADDER TUMOR (TURBT);  Surgeon: Roxane Copp, MD;  Location: [facility];  Service: Urology;  Laterality: N/A;   TRANSURETHRAL RESECTION OF BLADDER TUMOR N/A 07/21/2023   Procedure: TRANSURETHRAL RESECTION OF BLADDER TUMOR (TURBT);  Surgeon: Melody Spurling., MD;  Location: [facility];  Service: Urology;  Laterality: N/A;  60 MINUTES   TRANSURETHRAL RESECTION OF BLADDER TUMOR N/A 10/01/2023   Procedure: TRANSURETHRAL RESECTION OF BLADDER TUMOR (TURBT)/CLOT EVACUATION WITH FULGERATION;  Surgeon: Melody Spurling., MD;  Location: [facility];  Service: Urology;  Laterality: N/A;   TRANSURETHRAL RESECTION OF PROSTATE  2011   UPPER GI ENDOSCOPY  YRS AGO   URETEROSCOPY  Right 02/04/2024   Procedure: RIGHT CUTANEOUS URETEROSCOPY;  Surgeon: Melody Spurling., MD;  Location: [facility];  Service: Urology;  Laterality: Right;   Patient Active Problem List   Diagnosis Date Noted   Chronic right hip pain 03/20/2024   Prostatic adenocarcinoma (HCC) 03/03/2024   UTI (urinary tract infection) 03/02/2024   Complicated UTI (urinary tract infection) 03/02/2024   Ureterostomy status (HCC) 02/15/2024   History of iron deficiency 12/20/2023   Dysuria 10/15/2023   AKI (acute kidney injury) (HCC) 10/08/2023   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 10/02/2023   Decreased appetite 09/28/2023   Hardening of the aorta (main artery of the heart) (HCC) 09/28/2023   Mixed hyperlipidemia 09/28/2023   Prediabetes 09/28/2023   Panic attack 09/28/2023   Pedal edema 09/28/2023   Osteoarthritis 09/28/2023   Sinusitis chronic, sphenoidal 09/28/2023   Acute blood loss anemia (ABLA) 09/28/2023   Hypothyroidism 09/28/2023   Low serum vitamin B12 09/10/2023   Anemia of chronic disease 09/10/2023   Normocytic anemia 08/06/2023   Unintentional weight loss 04/09/2021   Leg weakness, bilateral 04/09/2021   Surgical counseling visit 04/09/2021   Essential hypertension 05/23/2020   Sleep apnea treated with nocturnal BiPAP 01/25/2020   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   Hypersomnia with sleep apnea 09/15/2019   Cardiac arrhythmia due to premature depolarization 09/15/2019   Excessive daytime sleepiness 09/15/2019   CKD (chronic kidney disease) stage 4, GFR 15-29 ml/min (HCC) - Baseline Scr 3.0 09/15/2019   Malignant neoplasm of urinary bladder (HCC) 09/15/2019   Gross hematuria 09/11/2017   Benign prostatic hyperplasia without lower urinary tract symptoms 12/09/2012   GERD (gastroesophageal reflux disease) 11/28/2012     PCP: Virgle Grime  REFERRING PROVIDER: Virgle Grime  REFERRING DIAG: Leg weakness   THERAPY DIAG:  Pain in right  hip  Muscle weakness (generalized)  Other abnormalities of gait and mobility  Rationale for Evaluation and Treatment: Rehabilitation  ONSET DATE:    SUBJECTIVE:   SUBJECTIVE STATEMENT:   Pt saw Ortho, had new x-ray. He is waiting for medical clearance for possible THA. Still having much pain and difficulty with hip. More pain in the last week .   Eval:  Pt states weakness in legs R>L ongoing for a while. He had significant weakness previously from medication issues but has slightly improved. He states pain in his R hip, sharp pains with twisting at times, and instability feeling with walking and activity due to hip pain.He has had previous L knee surgery, states R hip OA, and has had recent surgery for cancer,  with bladder and prostate removal. Since then he states feeling pretty good, he does have swelling in feet and legs, just saw Dr this week who was not concerned. He is wearing sandals today, with obvious swelling in feet and legs.  He used to walk about 2 mi/day in past years, recently he did walk about 1.5 miles States minimal fatigue with endurance, but more limitation from R hip and leg weakness.  He is currently living with son and his wife in split level house. States doing ok with stairs for now. Has walking stick, but not using today, does usually use most of the time.    PERTINENT HISTORY: Recent bladder and prostate removal for CA, CKD,  Nephrectomy (L),    PAIN:  Are you having pain? Yes: NPRS scale: 5-6/10    Pain location: R hip/anterior/lateral Pain description: sore, stiff, shooting with twisting motion Aggravating factors: increased activity , initial standing  Relieving factors: none stated   PRECAUTIONS: None  WEIGHT BEARING RESTRICTIONS: No  FALLS:  Has patient fallen in last 6 months? No  PLOF: Independent  PATIENT GOALS:  Decreased pain in hip, improve walking and balance.   NEXT MD VISIT:   OBJECTIVE:  updated 5/22.  DIAGNOSTIC FINDINGS:    PATIENT SURVEYS:    COGNITION: Overall cognitive status: Within functional limits for tasks assessed     SENSATION: WFL  EDEMA: mod edema in L lower leg, has been much improved overall in bil lower legs and feet in past 2 weeks.    POSTURE:    No Significant postural limitations  PALPATION:     LOWER EXTREMITY ROM: Hips:  L: mild limitation for flex, ER, mod for IR,    R:  Significant  limitation for all motions.   LOWER EXTREMITY MMT:  MMT Left eval Right  eval Left:  5/2 Right 5/2 Right  Hip flexion 4- 3- 4 3 /3- 3/3-  Hip extension       Hip abduction       Hip adduction       Hip internal rotation       Hip external rotation       Knee flexion 4 4   4+  Knee extension 4+ 4+   4+  Ankle dorsiflexion       Ankle plantarflexion       Ankle inversion       Ankle eversion        (Blank rows = not tested)  LOWER EXTREMITY SPECIAL TESTS:    FUNCTIONAL TESTS:    GAIT: Distance walked: 26ft Assistive device utilized: Walker - 2 wheeled Level of assistance: Complete Independence and Modified independence Comments: decreased step height when R hip painful.    TODAY'S TREATMENT:                                                                                                                              DATE:   04/05/24:  Therapeutic Exercise: Aerobic: Supine:   hip ER rom x 10;  SAQ 3lb 2 x 10 bil;   LTR x 15;  assisted IR rom, and abd rom x 10 ea on R;  PROM R hip, all motions.   Heel slides x 10 bil;  Seated:    Sit to stand 2 x 5 ; higher mat table, no hands ;  Standing:  heel raises 2 x 10;   Stretches:    Neuromuscular Re-education: Manual Therapy:  LAD R hip;  Therapeutic Activity:   Self Care:   Gait:     PATIENT EDUCATION:  Education details: updated and reviewed HEP Person educated: Patient Education method: Explanation, Demonstration, Tactile cues, Verbal cues, and Handouts Education comprehension: verbalized understanding, returned  demonstration, verbal cues required, tactile cues required, and needs further education   HOME EXERCISE PROGRAM: Access Code: [email or web address]  ASSESSMENT:  CLINICAL IMPRESSION: PN/Re-cert : Pt has been seen for 10 visits. He continues to have pain and stiffness in R hip that limits his mobility. He is getting relief from range of motion and ther ex with PT sessions, but overall rom and pain not improving. He does have improved motion and pain, as well as gait pattern after sessions. He saw ortho this week, and is candidate for THA if he can medically be cleared due to PMH. He will benefit from continued care for 1-2 more visits, to continue pain relief and motion until he potentially has surgery. Pt in agreement with plan.   Eval: Patient presents with primary complaint of  pain in R hip, and weakness in R hip/LE. He has stiffness in bil hips, R>L, with lack of ROM. He has decreased strength in R hip vs L, with feelings of instability with activity. He has decreased gait mechanics, and will benefit from education on use of AD. He has decreased ability and stability with functional activity, gait, and stairs. He is overall deconditioned and will benefit from education on HEP . Pt with decreased ability for full functional activities. Pt will  benefit from skilled PT to improve deficits and pain and to return to PLOF.   OBJECTIVE IMPAIRMENTS: Abnormal gait, decreased activity tolerance, decreased balance, decreased knowledge of use of DME, decreased mobility, difficulty walking, decreased ROM, decreased strength, decreased safety awareness, impaired flexibility, improper body mechanics, and pain.   ACTIVITY LIMITATIONS: bending, standing, squatting, stairs, transfers, and locomotion level  PARTICIPATION LIMITATIONS: meal prep, cleaning, shopping, and community activity  PERSONAL FACTORS: Age, Past/current experiences, Time since onset of injury/illness/exacerbation, and 1-2 comorbidities: CA, recent  surgery, OA are also affecting patient's functional outcome.   REHAB POTENTIAL: Good  CLINICAL DECISION MAKING: Evolving/moderate complexity  EVALUATION COMPLEXITY: Moderate   GOALS: Goals reviewed with patient? Yes  SHORT TERM GOALS: Target date: 03/16/2024  Pt to be independent with initial HEP  Goal status: MET   2.  Pt to demo independent transfer from regular chair, without use of UEs.   Goal status: MET    LONG TERM GOALS: Target date: 05/25/24  Pt to be independent with final HEP  Goal status: in progress  2.  Pt to demo improved strength of R hip to at least 4/5 for flex and abd, to improve ability and stability for gait.   Goal status: in progress  3.  Pt to report decreased pain to 0-2/10 in R hip with activity.   Goal status: in progress  4.  Pt to demo optimal mechanics and safety  for gait and stairs,  with LRAD, to improve ability and safety with community activity.  Goal status: in progress    PLAN:  PT FREQUENCY: 1-2x/week  PT DURATION: 8 weeks  PLANNED INTERVENTIONS: Therapeutic exercises, Therapeutic activity, Neuromuscular re-education, Patient/Family education, Self Care, Joint mobilization, Joint manipulation, Stair training, Orthotic/Fit training, DME instructions, Aquatic Therapy, Dry Needling, Electrical stimulation, Cryotherapy, Moist heat, Taping, Ultrasound, Ionotophoresis 4mg /ml Dexamethasone , Manual therapy,  Vasopneumatic device, Traction, Spinal manipulation, Spinal mobilization,Balance training, Gait training,   PLAN FOR NEXT SESSION: stairs - 8 steps split level-    Terrilee Few, PT, DPT 11:56 AM  04/13/24

## 2024-04-20 ENCOUNTER — Other Ambulatory Visit: Payer: Self-pay

## 2024-04-20 ENCOUNTER — Other Ambulatory Visit: Payer: Self-pay | Admitting: *Deleted

## 2024-04-20 NOTE — Patient Instructions (Signed)
 Visit Information  Thank you for taking time to visit with me today. Please don't hesitate to contact me if I can be of assistance to you before our next scheduled appointment.  Our next appointment is by telephone on 6/13 at 10am with P. Slusher, RNCM.  Please call the care guide team at [phone] if you need to cancel or reschedule your appointment.   Following is a copy of your care plan:   Goals Addressed             This Visit's Progress    VBCI RN Care Plan       Problems:  Chronic Disease Management support and education needs related to bladder cancer  Goal: Over the next 45 days the Patient will attend all scheduled medical appointments: Outpatient PT on 6/5 and 6/19.  Call to schedule PCP visit, confirm upcoming urology visit as evidenced by completed appointments in EMR        continue to work with RN Care Manager and/or Social Worker to address care management and care coordination needs related to bladder cancer as evidenced by adherence to care management team scheduled appointments     experience decrease in ED visits as evidenced by electronic medical record review; ED visits in last 6 months = 2 not experience hospital admission as evidenced by review of electronic medical record. Hospital Admissions in last 6 months = 2 take all medications exactly as prescribed and will call provider for medication related questions as evidenced by patient reported compliance    work with outpatient PT to increase strength and decrease weakness as evidenced by review of electronic medical record and patient or care team member report    Interventions:   Oncology: Assessment of understanding of oncology diagnosis:  Assessed patient understanding of cancer diagnosis and recommended treatment plan Reviewed upcoming provider appointments and treatment appointments Assessed available transportation to appointments and treatments. Has consistent/reliable transportation: Yes Assessed  support system. Has consistent/reliable family or other support: Yes PHQ2/PHQ9 performed Continue good hygiene for ureterostomy and report any signs/symptoms of infection.  Patient Self-Care Activities:  Attend all scheduled provider appointments Call pharmacy for medication refills 3-7 days in advance of running out of medications Call provider office for new concerns or questions  Perform all self care activities independently  Take medications as prescribed    Plan:  The patient has been provided with contact information for the care management team and has been advised to call with any health related questions or concerns.              Please call the Suicide and Crisis Lifeline: 988 call the [place]  National Suicide Prevention Lifeline: (386)028-5605 or TTY: (731)534-9631 TTY 774-139-5097) to talk to a trained counselor call 1-800-273-TALK (toll free, 24 hour hotline) go to [facility] [address] [place] [phone]) call 911 if you are experiencing a Mental Health or Behavioral Health Crisis or need someone to talk to.  Patient verbalizes understanding of instructions and care plan provided today and agrees to view in MyChart. Active MyChart status and patient understanding of how to access instructions and care plan via MyChart confirmed with patient.     Holland Lundborg, RN, MSN, CCM [facility], [facility] RN Care Coordinator Direct Dial: [phone] / Main [phone] Fax [phone] Email: [email or web address]: [email or web address]

## 2024-04-20 NOTE — Patient Outreach (Signed)
 Complex Care Management   Visit Note  04/20/2024  Name:  [name] [name] MRN: [id number] DOB: 09/17/1932  Situation: Referral received for Complex Care Management related to bladder cancer I obtained verbal consent from Patient.  Visit completed with Patient  on the phone  Patient report he is doing well, son and daughter in law remain living with him to provide support.  He has been doing well with outpatient PT, but now only going once a week with plans to increase once he has hip surgery (still waiting on clearance). Denies any urgent concerns, encouraged to contact this care manager with questions.   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   BPH (benign prostatic hyperplasia)    CKD (chronic kidney disease), stage III (HCC)    STAGE 3  B CKD per lov 10-16-2020 dr Cathalene Clipper Seretha Dance kidney on chart   Dyspnea    Fatigue    MIDDLE OF DAY ON OCCASION   Frequency of urination    GERD (gastroesophageal reflux disease)    on protonix    Hematuria    Hematuria    History of colon polyps    HLD (hyperlipidemia)    HOH (hard of hearing)    slightly hoh right worse thanleft   HTN (hypertension)    Hypothyroidism    Incontinence of urine    weras pads all the time   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   Right Buccal Cheek   Pre-diabetes    Recurrent bladder papillary carcinoma (HCC) hx 2012 and 2014-- urologist- dr Claretta Croft   s/p  TURBT 08-16-2017  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   Renal mass 09/02/2022   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 10/02/2023   Sleep apnea    uses cpap set on 20 /4   Trigger finger of left hand 07/04/2021   last few months per pt   UTI (urinary tract infection)  08/20/2020   Weakness of extremity    left knee   Wears glasses     Assessment: Patient Reported Symptoms:  Cognitive Cognitive Status: Alert and oriented to person, place, and time, Normal speech and language skills, Able to follow simple commands Cognitive/Intellectual Conditions Management [RPT]: None reported or documented in medical history or problem list   Health Maintenance Behaviors: Annual physical exam, Exercise Healing Pattern: Unsure Health Facilitated by: Healthy diet, Rest  Neurological Neurological Review of Symptoms: No symptoms reported    HEENT HEENT Symptoms Reported: Other: (dry eyes) HEENT Conditions: Vision problem(s) (Will start using eye drops as instructed) HEENT Management Strategies: Medication therapy HEENT Self-Management Outcome: 4 (good) Vision problem(s) (Will start using eye drops as instructed)  Cardiovascular Cardiovascular Symptoms Reported: Swelling in legs or feet Does patient have uncontrolled Hypertension?: No Cardiovascular Management Strategies: Diet modification, Exercise, Medication therapy Cardiovascular Self-Management Outcome: 4 (good) Cardiovascular Comment: Encouraged to elevate legs and use compression socks  Respiratory Respiratory Symptoms Reported: No symptoms reported    Endocrine Patient reports the following symptoms related to hypoglycemia or hyperglycemia : No symptoms reported    Gastrointestinal Gastrointestinal Symptoms Reported: Constipation Additional Gastrointestinal Details: Chronic constipation, takes Senna and Metamucil Gastrointestinal Conditions: Constipation Gastrointestinal Management Strategies: Medication therapy, Diet  modification Gastrointestinal Self-Management Outcome: 4 (good)    Genitourinary Genitourinary Symptoms Reported: Other Additional Genitourinary Details: bladder cancer Genitourinary Conditions: Bladder cancer Genitourinary Management Strategies: Urostomy  Integumentary Integumentary  Symptoms Reported: Bruising Skin Self-Management Outcome: 4 (good)  Musculoskeletal Musculoskelatal Symptoms Reviewed: Weakness Additional Musculoskeletal Details: General weakness due to current medical condition.  Has hip pain, in need of replacement, but will need to be cleared before ortho will schedule surgery Musculoskeletal Conditions: Joint pain Musculoskeletal Management Strategies: Routine screening, Exercise Musculoskeletal Self-Management Outcome: 3 (uncertain) Musculoskeletal Comment: Active with outpatient PT Falls in the past year?: No Number of falls in past year: 1 or less Was there an injury with Fall?: No Fall Risk Category Calculator: 0 Patient Fall Risk Level: Low Fall Risk Fall risk Follow up: Falls evaluation completed  Psychosocial Psychosocial Symptoms Reported: No symptoms reported     Quality of Family Relationships: involved, helpful, supportive Do you feel physically threatened by others?: No      04/20/2024    3:18 PM  Depression screen PHQ 2/9  Decreased Interest 0  Down, Depressed, Hopeless 0  PHQ - 2 Score 0    Vitals:   04/20/24 1137  BP: 122/76    Medications Reviewed Today     Reviewed by Holland Lundborg, RN (Registered Nurse) on 04/20/24 at 1137  Med List Status: <None>   Medication Order Taking? Sig Documenting Provider Last Dose Status Informant  amLODipine  (NORVASC ) 10 MG tablet [id number] Yes Take 1 tablet (10 mg total) by mouth daily.  Patient taking differently: Take 10 mg by mouth at bedtime.   Gonfa, Taye T, MD Taking Active Self  ferrous sulfate  325 (65 FE) MG tablet [id number] Yes Take 325 mg by mouth daily with supper. [provider] Taking Active Self  finasteride  (PROSCAR ) 5 MG tablet [id number] Yes Take 5 mg by mouth every evening. [provider] Taking Active Self  levothyroxine  (SYNTHROID , LEVOTHROID) 50 MCG tablet [id number] Yes Take 50 mcg by mouth daily before breakfast. [provider] Taking  Active Self           Med Note Emory Harps, ANAIS   Sat Sep 11, 2017  8:33 AM)    pantoprazole  (PROTONIX ) 20 MG tablet [id number] Yes Take 20 mg by mouth at bedtime. [provider] Taking Active Self           Med Note (ABDAL-RAFI, MUHAMMAD I   Thu Mar 02, 2024  3:26 AM)    psyllium (METAMUCIL) 58.6 % powder [id number] Yes Take 0.5 packets by mouth every evening. [provider] Taking Active Self           Med Note Parke Boll, BARBARA B   Wed Feb 09, 2024 11:06 AM) Does not feel it works and has had diarrhea.   rosuvastatin  (CRESTOR ) 5 MG tablet [id number] Yes Take 5 mg by mouth at bedtime. [provider] Taking Active Self  senna-docusate (SENOKOT-S) 8.6-50 MG tablet [id number] Yes Take 1 tablet by mouth 2 (two) times daily. While taking strong pain meds to prevent constipation Manny, Harvey Linen., MD Taking Active Self           Med Note [facility] [place] [facility] I   Thu Mar 02, 2024  3:28 AM)    traMADol (ULTRAM) 50 MG tablet [id number] Yes Take 50 mg by mouth 2 (two) times daily as needed. [provider] Taking Active             Recommendation:   PCP Follow-up Specialty provider follow-up outpatient  PT on 6/5 and 6/19 Advised to call PCP office to schedule appointment for leg/feet swelling  Follow Up Plan:   Telephone follow up appointment date/time:  6/13 at 10am with P. Slusher, RNCM  Holland Lundborg, RN, MSN, CCM [facility], [facility] RN Care Coordinator Direct Dial: [phone] / Main [phone] Fax [phone] Email: [email or web address]

## 2024-04-24 DIAGNOSIS — M25551 Pain in right hip: Secondary | ICD-10-CM | POA: Diagnosis not present

## 2024-04-24 DIAGNOSIS — R600 Localized edema: Secondary | ICD-10-CM | POA: Diagnosis not present

## 2024-04-24 DIAGNOSIS — Z01818 Encounter for other preprocedural examination: Secondary | ICD-10-CM | POA: Diagnosis not present

## 2024-04-27 ENCOUNTER — Ambulatory Visit: Admitting: Physical Therapy

## 2024-04-27 ENCOUNTER — Telehealth: Payer: Self-pay

## 2024-04-27 ENCOUNTER — Encounter: Payer: Self-pay | Admitting: Physical Therapy

## 2024-04-27 DIAGNOSIS — M25551 Pain in right hip: Secondary | ICD-10-CM | POA: Diagnosis not present

## 2024-04-27 DIAGNOSIS — R2689 Other abnormalities of gait and mobility: Secondary | ICD-10-CM

## 2024-04-27 DIAGNOSIS — M6281 Muscle weakness (generalized): Secondary | ICD-10-CM | POA: Diagnosis not present

## 2024-04-27 NOTE — Telephone Encounter (Signed)
 Med Rec and consent done     Patient Consent for Virtual Visit        [name] [name] has provided verbal consent on 04/27/2024 for a virtual visit (video or telephone).   CONSENT FOR VIRTUAL VISIT FOR:  [name] [name]  By participating in this virtual visit I agree to the following:  I hereby voluntarily request, consent and authorize [facility] and its employed or contracted physicians, physician assistants, nurse practitioners or other licensed health care professionals (the Practitioner), to provide me with telemedicine health care services (the "Services") as deemed necessary by the treating Practitioner. I acknowledge and consent to receive the Services by the Practitioner via telemedicine. I understand that the telemedicine visit will involve communicating with the Practitioner through live audiovisual communication technology and the disclosure of certain medical information by electronic transmission. I acknowledge that I have been given the opportunity to request an in-person assessment or other available alternative prior to the telemedicine visit and am voluntarily participating in the telemedicine visit.  I understand that I have the right to withhold or withdraw my consent to the use of telemedicine in the course of my care at any time, without affecting my right to future care or treatment, and that the Practitioner or I may terminate the telemedicine visit at any time. I understand that I have the right to inspect all information obtained and/or recorded in the course of the telemedicine visit and may receive copies of available information for a reasonable fee.  I understand that some of the potential risks of receiving the Services via telemedicine include:  Delay or interruption in medical evaluation due to technological equipment failure or disruption; Information transmitted may not be sufficient (e.g. poor resolution of images) to allow for appropriate  medical decision making by the Practitioner; and/or  In rare instances, security protocols could fail, causing a breach of personal health information.  Furthermore, I acknowledge that it is my responsibility to provide information about my medical history, conditions and care that is complete and accurate to the best of my ability. I acknowledge that Practitioner's advice, recommendations, and/or decision may be based on factors not within their control, such as incomplete or inaccurate data provided by me or distortions of diagnostic images or specimens that may result from electronic transmissions. I understand that the practice of medicine is not an exact science and that Practitioner makes no warranties or guarantees regarding treatment outcomes. I acknowledge that a copy of this consent can be made available to me via my patient portal [facility] MyChart), or I can request a printed copy by calling the office of [facility].    I understand that my insurance will be billed for this visit.   I have read or had this consent read to me. I understand the contents of this consent, which adequately explains the benefits and risks of the Services being provided via telemedicine.  I have been provided ample opportunity to ask questions regarding this consent and the Services and have had my questions answered to my satisfaction. I give my informed consent for the services to be provided through the use of telemedicine in my medical care

## 2024-04-27 NOTE — Telephone Encounter (Signed)
..     Pre-operative Risk Assessment    Patient Name: [name] [name]  DOB: 02-Oct-1932 MRN: [id number]   Date of last office visit: 07/09/23 Date of next office visit: NONE   Request for Surgical Clearance    Procedure:  RIGHT TOTAL HIP ARTHROPLASTY  Date of Surgery:  Clearance TBD                                Surgeon:  DR Norberto Bear Surgeon's Group or Practice Name:  [facility] Phone number:  [phone] Fax number:  [phone]   Type of Clearance Requested:   - Medical    Type of Anesthesia:  Spinal   Additional requests/questions:    SignedAzell Leopard   04/27/2024, 2:15 PM

## 2024-04-27 NOTE — Telephone Encounter (Signed)
 S/W pt and scheduled TELE Preop Appt 05/04/24  Med Rec and consent done

## 2024-04-27 NOTE — Telephone Encounter (Signed)
   Name: [name] [name]  DOB: Apr 12, 1932  MRN: [id number]  Primary Cardiologist: Sheryle Donning, MD   Preoperative team, please contact this patient and set up a phone call appointment for further preoperative risk assessment. Please obtain consent and complete medication review. Thank you for your help.  I confirm that guidance regarding antiplatelet and oral anticoagulation therapy has been completed and, if necessary, noted below.  None requested  I also confirmed the patient resides in the state of [place] . As per [facility] Board telemedicine laws, the patient must reside in the state in which the provider is licensed.   Ava Boatman, NP 04/27/2024, 4:48 PM [facility]

## 2024-04-27 NOTE — Therapy (Signed)
 OUTPATIENT PHYSICAL THERAPY LOWER EXTREMITY TREATMENT   Patient Name: [name] [name] MRN: [id number] DOB:[birth year], 89 y.o., male Today's Date: 04/27/2024    END OF SESSION:  PT End of Session - 04/27/24 1300     Visit Number 11    Number of Visits 16    Date for PT Re-Evaluation 05/25/24    Authorization Type [employer] medicare  PN/recert done at visit 10    PT Start Time 1303    PT Stop Time 1345    PT Time Calculation (min) 42 min    Activity Tolerance Patient tolerated treatment well    Behavior During Therapy WFL for tasks assessed/performed               Past Medical History:  Diagnosis Date   Anemia    Arthritis    lower back bulging disc, hips, knees, thumbs, shoulder   Asthma    as a pre teen   Bladder cancer (HCC) UROLOGIST-  DR MCKENZIE   S/P TURBT 08-16-2017   Bladder wall hemorrhage 09/28/2023   BPH (benign prostatic hyperplasia)    CKD (chronic kidney disease), stage III (HCC)    STAGE 3  B CKD per lov 10-16-2020 dr Cathalene Clipper Seretha Dance kidney on chart   Dyspnea    Fatigue    MIDDLE OF DAY ON OCCASION   Frequency of urination    GERD (gastroesophageal reflux disease)    on protonix    Hematuria    Hematuria    History of colon polyps    HLD (hyperlipidemia)    HOH (hard of hearing)    slightly hoh right worse thanleft   HTN (hypertension)    Hypothyroidism    Incontinence of urine    weras pads all the time   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   Right Buccal Cheek   Pre-diabetes    Recurrent bladder papillary carcinoma (HCC) hx 2012 and 2014-- urologist- dr Claretta Croft   s/p  TURBT 08-16-2017  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   Renal mass 09/02/2022   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 10/02/2023   Sleep apnea    uses cpap set on 20 /4   Trigger finger of left hand 07/04/2021   last few months per pt   UTI (urinary tract infection) 08/20/2020   Weakness of extremity    left knee    Wears glasses    Past Surgical History:  Procedure Laterality Date   BCG X 2     AFTER LAST 2 BLADDER TUMOR REMOVALS   CATARACT EXTRACTION, BILATERAL     COLONSCOPY  YRS AGO   CYSTOSCOPY N/A 10/29/2020   Procedure: CYSTOSCOPY;  Surgeon: Roxane Copp, MD;  Location: [facility];  Service: Urology;  Laterality: N/A;   CYSTOSCOPY N/A 07/08/2021   Procedure: CYSTOSCOPY;  Surgeon: Roxane Copp, MD;  Location: [facility];  Service: Urology;  Laterality: N/A;   CYSTOSCOPY W/ RETROGRADES Right 07/21/2023   Procedure: CYSTOSCOPY WITH RIGHT RETROGRADE PYELOGRAM;  Surgeon: Melody Spurling., MD;  Location: [facility];  Service: Urology;  Laterality: Right;   CYSTOSCOPY W/ URETERAL STENT PLACEMENT Bilateral 08/16/2017   Procedure: CYSTOSCOPY WITH RETROGRADE PYELOGRAM/URETERAL STENT PLACEMENT;  Surgeon: Marco Severs, MD;  Location: [facility];  Service: Urology;  Laterality: Bilateral;   CYSTOSCOPY W/ URETERAL STENT PLACEMENT Left 09/02/2022   Procedure: CYSTOSCOPY WITH RETROGRADE PYELOGRAM, URETERAL STENT PLACEMENT;  Surgeon: Osborn Blaze, MD;  Location:  [facility];  Service: Urology;  Laterality: Left;   CYSTOSCOPY WITH BIOPSY N/A 06/25/2020   Procedure: CYSTOSCOPY FULGURATION   BLADDER  BIOPSY;  Surgeon: Roxane Copp, MD;  Location: [facility];  Service: Urology;  Laterality: N/A;   CYSTOSCOPY WITH FULGERATION N/A 12/08/2019   Procedure: CYSTOSCOPY WITH FULGERATION;  Surgeon: Marco Severs, MD;  Location: [facility];  Service: Urology;  Laterality: N/A;   CYSTOSCOPY WITH FULGERATION N/A 03/24/2022   Procedure: CYSTOSCOPY WITH FULGERATION;  Surgeon: Roxane Copp, MD;  Location: [facility];  Service: Urology;  Laterality: N/A;   CYSTOSCOPY WITH RETROGRADE PYELOGRAM, URETEROSCOPY AND STENT PLACEMENT Left 03/24/2022   Procedure: CYSTOSCOPY WITH RETROGRADE PYELOGRAM, DIAGNOSTIC URETEROSCOPY WITH BIOPSY AND  STENT PLACEMENT;  Surgeon: Roxane Copp, MD;  Location: [facility];  Service: Urology;  Laterality: Left;  90 MINUTES   CYSTOSCOPY WITH RETROGRADE PYELOGRAM, URETEROSCOPY AND STENT PLACEMENT Left 04/07/2022   Procedure: CYSTOSCOPY WITH  RETROGRADE PYELOGRAM, DIAGNOSTIC URETEROSCOPY WITH BIOPSY AND STENT EXCHANGE;  Surgeon: Roxane Copp, MD;  Location: [facility];  Service: Urology;  Laterality: Left;  75 MINUTES   CYSTOSCOPY WITH RETROGRADE PYELOGRAM, URETEROSCOPY AND STENT PLACEMENT Left 08/11/2022   Procedure: CYSTOSCOPY WITH RETROGRADE PYELOGRAM, ANTEGRADE NEPHROSTOGRAM, DIAGNOSTIC  URETEROSCOPY;  Surgeon: Roxane Copp, MD;  Location: [facility];  Service: Urology;  Laterality: Left;  90 MINS   HOLMIUM LASER APPLICATION Left 08/11/2022   Procedure: HOLMIUM LASER APPLICATION;  Surgeon: Roxane Copp, MD;  Location: [facility];  Service: Urology;  Laterality: Left;   IR NEPHROSTOMY EXCHANGE LEFT  05/15/2022   IR NEPHROSTOMY EXCHANGE LEFT  07/10/2022   IR NEPHROSTOMY PLACEMENT LEFT  05/06/2022   KNEE ARTHROSCOPY Left 06/10/2011   LYMPH NODE DISSECTION Left 09/02/2022   Procedure: RETROPERITONEAL LYMPH NODE DISSECTION;  Surgeon: Osborn Blaze, MD;  Location: [facility];  Service: Urology;  Laterality: Left;   PROSTATE BIOPSY  2000   ROBOT ASSITED LAPAROSCOPIC NEPHROURETERECTOMY Left 09/02/2022   Procedure: XI ROBOT ASSITED LAPAROSCOPIC NEPHROURETERECTOMY;  Surgeon: Osborn Blaze, MD;  Location: [facility];  Service: Urology;  Laterality: Left;   ROBOT LAP RADICAL CYSTOPROSTATECTOMY PELVIC LYMPHADENECTOMY, NEOBLADDER N/A 02/04/2024   Procedure: ROBOT ASSISTED LAPAROSCOPIC RADICAL CYSTOPROSTATECTOMY;  Surgeon: Melody Spurling., MD;  Location: [facility];  Service: Urology;  Laterality: N/A;  300 MINUTES NEEDED FOR CASE   TRANSURETHRAL RESECTION OF BLADDER TUMOR N/A 08/16/2017   Procedure: TRANSURETHRAL RESECTION OF BLADDER TUMOR (TURBT);  Surgeon: Marco Severs, MD;  Location: [facility];  Service: Urology;  Laterality: N/A;   TRANSURETHRAL RESECTION OF BLADDER TUMOR  12-09-2012   dr Lavona Pounds [facility]   and TURP   TRANSURETHRAL RESECTION OF BLADDER TUMOR N/A 09/20/2017   Procedure: TRANSURETHRAL RESECTION OF BLADDER TUMOR (TURBT);  Surgeon: Marco Severs, MD;  Location: [facility];  Service: Urology;  Laterality: N/A;   TRANSURETHRAL RESECTION OF BLADDER TUMOR N/A 12/08/2019   Procedure: TRANSURETHRAL RESECTION OF BLADDER TUMOR;  Surgeon: Marco Severs, MD;  Location: [facility];  Service: Urology;  Laterality: N/A;  1 HR   TRANSURETHRAL RESECTION OF BLADDER TUMOR N/A 10/29/2020   Procedure: BLADDER FULGERATION;  Surgeon: Roxane Copp, MD;  Location: [facility];  Service: Urology;  Laterality: N/A;  30 MINS   TRANSURETHRAL RESECTION OF BLADDER TUMOR N/A 07/08/2021   Procedure: TRANSURETHRAL RESECTION OF BLADDER TUMOR (TURBT)/FULGERATION;  Surgeon: Roxane Copp, MD;  Location: [facility] ;  Service: Urology;  Laterality: N/A;  45 MINS   TRANSURETHRAL RESECTION OF BLADDER TUMOR N/A 12/02/2021   Procedure: TRANSURETHRAL RESECTION OF BLADDER TUMOR (TURBT);  Surgeon: Roxane Copp, MD;  Location: [facility];  Service: Urology;  Laterality: N/A;   TRANSURETHRAL RESECTION OF BLADDER TUMOR N/A 07/21/2023   Procedure: TRANSURETHRAL RESECTION OF BLADDER TUMOR (TURBT);  Surgeon: Melody Spurling., MD;  Location: [facility];  Service: Urology;  Laterality: N/A;  60 MINUTES   TRANSURETHRAL RESECTION OF BLADDER TUMOR N/A 10/01/2023   Procedure: TRANSURETHRAL RESECTION OF BLADDER TUMOR (TURBT)/CLOT EVACUATION WITH FULGERATION;  Surgeon: Melody Spurling., MD;  Location: [facility];  Service: Urology;  Laterality: N/A;   TRANSURETHRAL RESECTION OF PROSTATE  2011   UPPER GI ENDOSCOPY  YRS AGO   URETEROSCOPY Right 02/04/2024   Procedure: RIGHT CUTANEOUS URETEROSCOPY;  Surgeon: Melody Spurling., MD;   Location: [facility];  Service: Urology;  Laterality: Right;   Patient Active Problem List   Diagnosis Date Noted   Chronic right hip pain 03/20/2024   Prostatic adenocarcinoma (HCC) 03/03/2024   UTI (urinary tract infection) 03/02/2024   Complicated UTI (urinary tract infection) 03/02/2024   Ureterostomy status (HCC) 02/15/2024   History of iron deficiency 12/20/2023   Dysuria 10/15/2023   AKI (acute kidney injury) (HCC) 10/08/2023   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 10/02/2023   Decreased appetite 09/28/2023   Hardening of the aorta (main artery of the heart) (HCC) 09/28/2023   Mixed hyperlipidemia 09/28/2023   Prediabetes 09/28/2023   Panic attack 09/28/2023   Pedal edema 09/28/2023   Osteoarthritis 09/28/2023   Sinusitis chronic, sphenoidal 09/28/2023   Acute blood loss anemia (ABLA) 09/28/2023   Hypothyroidism 09/28/2023   Low serum vitamin B12 09/10/2023   Anemia of chronic disease 09/10/2023   Normocytic anemia 08/06/2023   Unintentional weight loss 04/09/2021   Leg weakness, bilateral 04/09/2021   Surgical counseling visit 04/09/2021   Essential hypertension 05/23/2020   Sleep apnea treated with nocturnal BiPAP 01/25/2020   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   Hypersomnia with sleep apnea 09/15/2019   Cardiac arrhythmia due to premature depolarization 09/15/2019   Excessive daytime sleepiness 09/15/2019   CKD (chronic kidney disease) stage 4, GFR 15-29 ml/min (HCC) - Baseline Scr 3.0 09/15/2019   Malignant neoplasm of urinary bladder (HCC) 09/15/2019   Gross hematuria 09/11/2017   Benign prostatic hyperplasia without lower urinary tract symptoms 12/09/2012   GERD (gastroesophageal reflux disease) 11/28/2012     PCP: Virgle Grime  REFERRING PROVIDER: Virgle Grime  REFERRING DIAG: Leg weakness   THERAPY DIAG:  Pain in right hip  Muscle weakness (generalized)  Other abnormalities of gait and mobility  Rationale for  Evaluation and Treatment: Rehabilitation  ONSET DATE:    SUBJECTIVE:   SUBJECTIVE STATEMENT:  Pt awaiting review of clearance for surgery. Not yet scheduled. Pt states continued and slightly worsening pain and stiffness. Feeling that he is getting weaker because of lack of mobility.   Eval:  Pt states weakness in legs R>L ongoing for a while. He had significant weakness previously from medication issues but has slightly improved. He states pain in his R hip, sharp pains with twisting at times, and instability feeling with walking and activity due to hip pain.He has had previous L knee surgery, states R hip OA, and has had recent surgery for cancer, with bladder and prostate removal. Since then he states feeling pretty good, he does have swelling in feet and legs, just  saw Dr this week who was not concerned. He is wearing sandals today, with obvious swelling in feet and legs.  He used to walk about 2 mi/day in past years, recently he did walk about 1.5 miles States minimal fatigue with endurance, but more limitation from R hip and leg weakness.  He is currently living with son and his wife in split level house. States doing ok with stairs for now. Has walking stick, but not using today, does usually use most of the time.    PERTINENT HISTORY: Recent bladder and prostate removal for CA, CKD,  Nephrectomy (L),    PAIN:  Are you having pain? Yes: NPRS scale: 5-6/10    Pain location: R hip/anterior/lateral Pain description: sore, stiff, shooting with twisting motion Aggravating factors: increased activity , initial standing  Relieving factors: none stated   PRECAUTIONS: None  WEIGHT BEARING RESTRICTIONS: No  FALLS:  Has patient fallen in last 6 months? No  PLOF: Independent  PATIENT GOALS:  Decreased pain in hip, improve walking and balance.   NEXT MD VISIT:   OBJECTIVE:  updated 5/22.  DIAGNOSTIC FINDINGS:   PATIENT SURVEYS:    COGNITION: Overall cognitive status: Within  functional limits for tasks assessed     SENSATION: WFL  EDEMA: mod edema in L lower leg, has been much improved overall in bil lower legs and feet in past 2 weeks.    POSTURE:    No Significant postural limitations  PALPATION:     LOWER EXTREMITY ROM: Hips:  L: mild limitation for flex, ER, mod for IR,    R:  Significant  limitation for all motions.   LOWER EXTREMITY MMT:  MMT Left eval Right  eval Left:  5/2 Right 5/2 Right  Hip flexion 4- 3- 4 3 /3- 3/3-  Hip extension       Hip abduction       Hip adduction       Hip internal rotation       Hip external rotation       Knee flexion 4 4   4+  Knee extension 4+ 4+   4+  Ankle dorsiflexion       Ankle plantarflexion       Ankle inversion       Ankle eversion        (Blank rows = not tested)  LOWER EXTREMITY SPECIAL TESTS:    FUNCTIONAL TESTS:    GAIT: Distance walked: 251ft Assistive device utilized: Walker - 2 wheeled Level of assistance: Complete Independence and Modified independence Comments: decreased step height when R hip painful.    TODAY'S TREATMENT:                                                                                                                              DATE:   04/27/2024 Therapeutic Exercise: Aerobic: Supine:   hip ER rom x 10;  PROM R hip, all motions.   Heel slides  x 10 bil;  Marching / hip flexion on R with towel assist x 15; . Seated:   LAQ x 20 bil;   Sit to stand  x 12 ; higher mat table, no hands ;  Standing:  heel raises 2 x 10;   Stretches:    Neuromuscular Re-education: Manual Therapy:  LAD R hip;  Therapeutic Activity:   Self Care:   Gait:     PATIENT EDUCATION:  Education details: updated and reviewed HEP Person educated: Patient Education method: Explanation, Demonstration, Tactile cues, Verbal cues, and Handouts Education comprehension: verbalized understanding, returned demonstration, verbal cues required, tactile cues required, and needs further  education   HOME EXERCISE PROGRAM: Access Code: [email or web address]  ASSESSMENT:  CLINICAL IMPRESSION: Pt continues to have stiffness and pain in R hip. He is benefiting from manual rom and PT sessions for decreasing stiffness and pain. Pain is limiting further progression of mobility. Pt to benefit from continued care for maintaining current ability.   Eval: Patient presents with primary complaint of  pain in R hip, and weakness in R hip/LE. He has stiffness in bil hips, R>L, with lack of ROM. He has decreased strength in R hip vs L, with feelings of instability with activity. He has decreased gait mechanics, and will benefit from education on use of AD. He has decreased ability and stability with functional activity, gait, and stairs. He is overall deconditioned and will benefit from education on HEP . Pt with decreased ability for full functional activities. Pt will  benefit from skilled PT to improve deficits and pain and to return to PLOF.   OBJECTIVE IMPAIRMENTS: Abnormal gait, decreased activity tolerance, decreased balance, decreased knowledge of use of DME, decreased mobility, difficulty walking, decreased ROM, decreased strength, decreased safety awareness, impaired flexibility, improper body mechanics, and pain.   ACTIVITY LIMITATIONS: bending, standing, squatting, stairs, transfers, and locomotion level  PARTICIPATION LIMITATIONS: meal prep, cleaning, shopping, and community activity  PERSONAL FACTORS: Age, Past/current experiences, Time since onset of injury/illness/exacerbation, and 1-2 comorbidities: CA, recent surgery, OA are also affecting patient's functional outcome.   REHAB POTENTIAL: Good  CLINICAL DECISION MAKING: Evolving/moderate complexity  EVALUATION COMPLEXITY: Moderate   GOALS: Goals reviewed with patient? Yes  SHORT TERM GOALS: Target date: 03/16/2024  Pt to be independent with initial HEP  Goal status: MET   2.  Pt to demo independent transfer from regular  chair, without use of UEs.   Goal status: MET    LONG TERM GOALS: Target date: 05/25/24  Pt to be independent with final HEP  Goal status: in progress  2.  Pt to demo improved strength of R hip to at least 4/5 for flex and abd, to improve ability and stability for gait.   Goal status: in progress  3.  Pt to report decreased pain to 0-2/10 in R hip with activity.   Goal status: in progress  4.  Pt to demo optimal mechanics and safety  for gait and stairs, with LRAD, to improve ability and safety with community activity.  Goal status: in progress    PLAN:  PT FREQUENCY: 1-2x/week  PT DURATION: 8 weeks  PLANNED INTERVENTIONS: Therapeutic exercises, Therapeutic activity, Neuromuscular re-education, Patient/Family education, Self Care, Joint mobilization, Joint manipulation, Stair training, Orthotic/Fit training, DME instructions, Aquatic Therapy, Dry Needling, Electrical stimulation, Cryotherapy, Moist heat, Taping, Ultrasound, Ionotophoresis 4mg /ml Dexamethasone , Manual therapy,  Vasopneumatic device, Traction, Spinal manipulation, Spinal mobilization,Balance training, Gait training,   PLAN FOR NEXT SESSION: stairs - 8 steps split level-  Terrilee Few, PT, DPT 8:44 PM  04/27/24

## 2024-05-03 DIAGNOSIS — Z936 Other artificial openings of urinary tract status: Secondary | ICD-10-CM | POA: Diagnosis not present

## 2024-05-04 ENCOUNTER — Ambulatory Visit: Attending: Cardiology | Admitting: Nurse Practitioner

## 2024-05-04 ENCOUNTER — Encounter: Payer: Self-pay | Admitting: Nurse Practitioner

## 2024-05-04 DIAGNOSIS — Z01810 Encounter for preprocedural cardiovascular examination: Secondary | ICD-10-CM

## 2024-05-04 NOTE — Telephone Encounter (Signed)
 Left message for pt to call our office and schedule IN OFFICE Preop appt.

## 2024-05-04 NOTE — Progress Notes (Signed)
 Virtual Visit via Telephone Note   Because of [name] [name] co-morbid illnesses, he is at least at moderate risk for complications without adequate follow up.  This format is felt to be most appropriate for this patient at this time.  Due to technical limitations with video connection Web designer), today's appointment will be conducted as an audio only telehealth visit, and [name] [name] verbally agreed to proceed in this manner.   All issues noted in this document were discussed and addressed.  No physical exam could be performed with this format.  Evaluation Performed:  Preoperative cardiovascular risk assessment _____________   Date:  05/04/2024   Patient ID:  [name] [name], DOB 1932-09-01, MRN [id number] Patient Location:  Home Provider location:   Office  Primary Care Provider:  Virgle Grime, MD Primary Cardiologist:  Sheryle Donning, MD  Chief Complaint / Patient Profile   89 y.o. y/o male with a h/o sleep apnea on BiPAP, bladder cancer, hyperlipidemia, hypertension who is pending right total hip arthroplasty with Dr. Lucienne Ryder date TBD and presents today for telephonic preoperative cardiovascular risk assessment.  History of Present Illness    [name] [name] is a 89 y.o. male who presents via audio/video conferencing for a telehealth visit today.  Pt was last seen in cardiology clinic on 07/09/2023 by Dr. Veryl Gottron.  At that time [name] [name] was doing well.  The patient is now pending procedure as outlined above. Since his last visit, he reports increased shortness of breath and edema. Due to concern for cardiac involvement, we will change his appointment to an in-person office visit.    Past Medical History    Past Medical History:  Diagnosis Date   Anemia    Arthritis    lower back bulging disc, hips, knees, thumbs, shoulder   Asthma    as a pre teen   Bladder cancer (HCC) UROLOGIST-  DR MCKENZIE   S/P TURBT 08-16-2017    Bladder wall hemorrhage 09/28/2023   BPH (benign prostatic hyperplasia)    CKD (chronic kidney disease), stage III (HCC)    STAGE 3  B CKD per lov 10-16-2020 dr Cathalene Clipper Seretha Dance kidney on chart   Dyspnea    Fatigue    MIDDLE OF DAY ON OCCASION   Frequency of urination    GERD (gastroesophageal reflux disease)    on protonix    Hematuria    Hematuria    History of colon polyps    HLD (hyperlipidemia)    HOH (hard of hearing)    slightly hoh right worse thanleft   HTN (hypertension)    Hypothyroidism    Incontinence of urine    weras pads all the time   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   Right Buccal Cheek   Pre-diabetes    Recurrent bladder papillary carcinoma (HCC) hx 2012 and 2014-- urologist- dr Claretta Croft   s/p  TURBT 08-16-2017  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   Renal mass 09/02/2022   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 10/02/2023   Sleep apnea    uses cpap set on 20 /4   Trigger finger of left hand 07/04/2021   last few months per pt   UTI (urinary tract infection) 08/20/2020   Weakness of extremity    left knee   Wears glasses    Past Surgical History:  Procedure Laterality Date   BCG X 2     AFTER LAST 2 BLADDER TUMOR REMOVALS   CATARACT  EXTRACTION, BILATERAL     COLONSCOPY  YRS AGO   CYSTOSCOPY N/A 10/29/2020   Procedure: CYSTOSCOPY;  Surgeon: Roxane Copp, MD;  Location: [facility];  Service: Urology;  Laterality: N/A;   CYSTOSCOPY N/A 07/08/2021   Procedure: CYSTOSCOPY;  Surgeon: Roxane Copp, MD;  Location: [facility];  Service: Urology;  Laterality: N/A;   CYSTOSCOPY W/ RETROGRADES Right 07/21/2023   Procedure: CYSTOSCOPY WITH RIGHT RETROGRADE PYELOGRAM;  Surgeon: Melody Spurling., MD;  Location: [facility];  Service: Urology;  Laterality: Right;   CYSTOSCOPY W/ URETERAL STENT PLACEMENT Bilateral 08/16/2017   Procedure: CYSTOSCOPY WITH RETROGRADE  PYELOGRAM/URETERAL STENT PLACEMENT;  Surgeon: Marco Severs, MD;  Location: [facility];  Service: Urology;  Laterality: Bilateral;   CYSTOSCOPY W/ URETERAL STENT PLACEMENT Left 09/02/2022   Procedure: CYSTOSCOPY WITH RETROGRADE PYELOGRAM, URETERAL STENT PLACEMENT;  Surgeon: Osborn Blaze, MD;  Location: [facility];  Service: Urology;  Laterality: Left;   CYSTOSCOPY WITH BIOPSY N/A 06/25/2020   Procedure: CYSTOSCOPY FULGURATION   BLADDER  BIOPSY;  Surgeon: Roxane Copp, MD;  Location: [facility];  Service: Urology;  Laterality: N/A;   CYSTOSCOPY WITH FULGERATION N/A 12/08/2019   Procedure: CYSTOSCOPY WITH FULGERATION;  Surgeon: Marco Severs, MD;  Location: [facility];  Service: Urology;  Laterality: N/A;   CYSTOSCOPY WITH FULGERATION N/A 03/24/2022   Procedure: CYSTOSCOPY WITH FULGERATION;  Surgeon: Roxane Copp, MD;  Location: [facility];  Service: Urology;  Laterality: N/A;   CYSTOSCOPY WITH RETROGRADE PYELOGRAM, URETEROSCOPY AND STENT PLACEMENT Left 03/24/2022   Procedure: CYSTOSCOPY WITH RETROGRADE PYELOGRAM, DIAGNOSTIC URETEROSCOPY WITH BIOPSY AND STENT PLACEMENT;  Surgeon: Roxane Copp, MD;  Location: [facility];  Service: Urology;  Laterality: Left;  90 MINUTES   CYSTOSCOPY WITH RETROGRADE PYELOGRAM, URETEROSCOPY AND STENT PLACEMENT Left 04/07/2022   Procedure: CYSTOSCOPY WITH  RETROGRADE PYELOGRAM, DIAGNOSTIC URETEROSCOPY WITH BIOPSY AND STENT EXCHANGE;  Surgeon: Roxane Copp, MD;  Location: [facility];  Service: Urology;  Laterality: Left;  75 MINUTES   CYSTOSCOPY WITH RETROGRADE PYELOGRAM, URETEROSCOPY AND STENT PLACEMENT Left 08/11/2022   Procedure: CYSTOSCOPY WITH RETROGRADE PYELOGRAM, ANTEGRADE NEPHROSTOGRAM, DIAGNOSTIC  URETEROSCOPY;  Surgeon: Roxane Copp, MD;  Location: [facility];  Service: Urology;  Laterality: Left;  90 MINS   HOLMIUM LASER APPLICATION Left 08/11/2022   Procedure: HOLMIUM LASER APPLICATION;  Surgeon: Roxane Copp, MD;  Location: [facility];  Service: Urology;  Laterality: Left;   IR NEPHROSTOMY EXCHANGE LEFT  05/15/2022   IR NEPHROSTOMY EXCHANGE LEFT  07/10/2022   IR NEPHROSTOMY PLACEMENT LEFT  05/06/2022   KNEE ARTHROSCOPY Left 06/10/2011   LYMPH NODE DISSECTION Left 09/02/2022   Procedure: RETROPERITONEAL LYMPH NODE DISSECTION;  Surgeon: Osborn Blaze, MD;  Location: [facility];  Service: Urology;  Laterality: Left;   PROSTATE BIOPSY  2000   ROBOT ASSITED LAPAROSCOPIC NEPHROURETERECTOMY Left 09/02/2022   Procedure: XI ROBOT ASSITED LAPAROSCOPIC NEPHROURETERECTOMY;  Surgeon: Osborn Blaze, MD;  Location: [facility];  Service: Urology;  Laterality: Left;   ROBOT LAP RADICAL CYSTOPROSTATECTOMY PELVIC LYMPHADENECTOMY, NEOBLADDER N/A 02/04/2024   Procedure: ROBOT ASSISTED LAPAROSCOPIC RADICAL CYSTOPROSTATECTOMY;  Surgeon: Melody Spurling., MD;  Location: [facility];  Service: Urology;  Laterality: N/A;  300 MINUTES NEEDED FOR CASE   TRANSURETHRAL RESECTION OF BLADDER TUMOR N/A 08/16/2017   Procedure: TRANSURETHRAL RESECTION OF BLADDER TUMOR (TURBT);  Surgeon: Marco Severs, MD;  Location: [facility];  Service: Urology;  Laterality: N/A;  TRANSURETHRAL RESECTION OF BLADDER TUMOR  12-09-2012   dr Lavona Pounds [facility]   and TURP   TRANSURETHRAL RESECTION OF BLADDER TUMOR N/A 09/20/2017   Procedure: TRANSURETHRAL RESECTION OF BLADDER TUMOR (TURBT);  Surgeon: Marco Severs, MD;  Location: [facility];  Service: Urology;  Laterality: N/A;   TRANSURETHRAL RESECTION OF BLADDER TUMOR N/A 12/08/2019   Procedure: TRANSURETHRAL RESECTION OF BLADDER TUMOR;  Surgeon: Marco Severs, MD;  Location[facility];  Service: Urology;  Laterality: N/A;  1 HR   TRANSURETHRAL RESECTION OF BLADDER TUMOR N/A 10/29/2020   Procedure: BLADDER FULGERATION;  Surgeon: Roxane Copp, MD;  Location: [facility];  Service: Urology;  Laterality: N/A;  30 MINS    TRANSURETHRAL RESECTION OF BLADDER TUMOR N/A 07/08/2021   Procedure: TRANSURETHRAL RESECTION OF BLADDER TUMOR (TURBT)/FULGERATION;  Surgeon: Roxane Copp, MD;  Location: [facility];  Service: Urology;  Laterality: N/A;  45 MINS   TRANSURETHRAL RESECTION OF BLADDER TUMOR N/A 12/02/2021   Procedure: TRANSURETHRAL RESECTION OF BLADDER TUMOR (TURBT);  Surgeon: Roxane Copp, MD;  Location: [facility];  Service: Urology;  Laterality: N/A;   TRANSURETHRAL RESECTION OF BLADDER TUMOR N/A 07/21/2023   Procedure: TRANSURETHRAL RESECTION OF BLADDER TUMOR (TURBT);  Surgeon: Melody Spurling., MD;  Location: [facility];  Service: Urology;  Laterality: N/A;  60 MINUTES   TRANSURETHRAL RESECTION OF BLADDER TUMOR N/A 10/01/2023   Procedure: TRANSURETHRAL RESECTION OF BLADDER TUMOR (TURBT)/CLOT EVACUATION WITH FULGERATION;  Surgeon: Melody Spurling., MD;  Location: [facility];  Service: Urology;  Laterality: N/A;   TRANSURETHRAL RESECTION OF PROSTATE  2011   UPPER GI ENDOSCOPY  YRS AGO   URETEROSCOPY Right 02/04/2024   Procedure: RIGHT CUTANEOUS URETEROSCOPY;  Surgeon: Melody Spurling., MD;  Location: [facility];  Service: Urology;  Laterality: Right;    Allergies  Allergies  Allergen Reactions   Macrobid [Nitrofurantoin] Shortness Of Breath   Ciprofloxacin  Diarrhea and Other (See Comments)    Occurred with 750 mg dose. Has since taken 500 mg doses and has had no reaction/diarrhea.   Keytruda  [Pembrolizumab ] Other (See Comments)    Weakness in both legs and fatigue   Cephalexin Rash and Other (See Comments)    Rash on arms; tolerated Rocephin /cefpodoxime    [name]  [Mitomycin ] Rash and Other (See Comments)    All over body RASH; possibly triggered kidney REMOVAL and RESTLESS LEGS also    Home Medications    Prior to Admission medications   Medication Sig Start Date End Date Taking? Authorizing Provider  amLODipine  (NORVASC ) 10 MG tablet Take 1 tablet (10 mg total) by mouth  daily. Patient taking differently: Take 10 mg by mouth at bedtime. 08/23/20   Gonfa, Taye T, MD  ferrous sulfate  325 (65 FE) MG tablet Take 325 mg by mouth daily with supper.    [provider]  finasteride  (PROSCAR ) 5 MG tablet Take 5 mg by mouth every evening.    [provider]  levothyroxine  (SYNTHROID , LEVOTHROID) 50 MCG tablet Take 50 mcg by mouth daily before breakfast. 12/25/15   [provider]  pantoprazole  (PROTONIX ) 20 MG tablet Take 20 mg by mouth at bedtime. 12/20/15   [provider]  psyllium (METAMUCIL) 58.6 % powder Take 0.5 packets by mouth every evening.    [provider]  rosuvastatin  (CRESTOR ) 5 MG tablet Take 5 mg by mouth at bedtime.    [provider]  senna-docusate (SENOKOT-S) 8.6-50 MG tablet  Take 1 tablet by mouth 2 (two) times daily. While taking strong pain meds to prevent constipation 09/04/22   Manny, Harvey Linen., MD  traMADol (ULTRAM) 50 MG tablet Take 50 mg by mouth 2 (two) times daily as needed. 04/05/24   [provider]    Physical Exam    Vital Signs:  [name] [name] does not have vital signs available for review today.  Given telephonic nature of communication, physical exam is limited. AAOx3. NAD. Normal affect.  Speech and respirations are unlabored.  Accessory Clinical Findings    None  Assessment & Plan    1.  Preoperative Cardiovascular Risk Assessment: Assessment will be completed by a provider at [facility] at time of in-person office visit.  The patient was advised that if he develops new symptoms prior to surgery to contact our office to arrange for a follow-up visit, and he verbalized understanding.  No request to hold cardiac medications.  A copy of this note will be routed to requesting surgeon.  Time:   Today, I have spent 10 minutes with the patient with telehealth technology discussing medical history, symptoms, and management plan.     Gerldine Koch,  NP-C  05/04/2024, 10:32 AM [address], [address] [place], [place] [address] Office [phone] Fax [phone]

## 2024-05-04 NOTE — Telephone Encounter (Signed)
 S/W pt and scheduled IN OFFICE Preop appt 05/10/24 with Dayna Dunn, PA  Pt stated has weakness in legs with swelling and SOB.

## 2024-05-09 ENCOUNTER — Encounter: Payer: Self-pay | Admitting: Physician Assistant

## 2024-05-09 NOTE — Progress Notes (Deleted)
   Cardiology Office Note    Date:  05/09/2024  ID:  AYDDEN CUMPIAN, DOB 13-Feb-1932, MRN [id number] PCP:  Virgle Grime, MD  Cardiologist:  Sheryle Donning, MD  Electrophysiologist:  None   Chief Complaint: ***  History of Present Illness: .    Kalel Harty Gingerich is a 89 y.o. male with visit-pertinent history of sleep apnea on BiPAP, bladder cancer, HTN, anemia, arthritis, BPH, CKD stage 5, GERD, HLD (lipids not followed by cardiology), mild AI, mild dilation of ascending aorta seen for pre-operative evaluation and leg swelling. He established in 2021 for lightheadedness, fatigue, palpitations, and SOB. 2D Echo 04/2020 showed EF 55-60%, mild LVH, normal diastolic parameters, mild LAE, trivial MR, mild AI, mild dilation of ascending aorta. ETT 05/2020 showed upsloping ST depression but considered a negative, adequate test. Nuc 07/2020 was negative, EF 71%. He has prior history of edema prompting compression hose in visit 2023. He has had several urologic admissions between November and April with notable CKD stage IV bordering into stage V. We are asked to evaluate for preoperative evaluation.  Amlodipine  Nephrology?  Mag low, severe CKD   Preoperative CV evaluation Lower extremity edema Essential HTN Mild dilation of ascending aorta, mild AI in 2021   Labwork independently reviewed: 03/2024 K 4.5, Cr 3.88, Hgb 19.3, plt OK 02/2024 TSH, FT4 ok, Mg 1.4 10/2023 Trops 43->42 checked in setting of hand cramping, not felt to reflect ACS 2021 LDL 63  ROS: .    Please see the history of present illness. Otherwise, review of systems is positive for ***.  All other systems are reviewed and otherwise negative.  Studies Reviewed: Aaron Aas    EKG:  EKG is ordered today, personally reviewed, demonstrating ***  CV Studies: Cardiac studies reviewed are outlined and summarized above. Otherwise please see EMR for full report.   Current Reported Medications:.    No outpatient  medications have been marked as taking for the 05/10/24 encounter (Appointment) with Willow Shidler N, PA-C.    Physical Exam:    VS:  There were no vitals taken for this visit.   Wt Readings from Last 3 Encounters:  04/04/24 171 lb 11.2 oz (77.9 kg)  03/20/24 174 lb (78.9 kg)  03/06/24 183 lb 1.6 oz (83.1 kg)    GEN: Well nourished, well developed in no acute distress NECK: No JVD; No carotid bruits CARDIAC: ***RRR, no murmurs, rubs, gallops RESPIRATORY:  Clear to auscultation without rales, wheezing or rhonchi  ABDOMEN: Soft, non-tender, non-distended EXTREMITIES:  No edema; No acute deformity   Asessement and Plan:.     ***     Disposition: F/u with ***  Signed, Hoover Grewe N Antavious Spanos, PA-C

## 2024-05-10 ENCOUNTER — Ambulatory Visit: Admitting: Physician Assistant

## 2024-05-11 ENCOUNTER — Ambulatory Visit: Admitting: Physical Therapy

## 2024-05-11 ENCOUNTER — Encounter: Payer: Self-pay | Admitting: Physical Therapy

## 2024-05-11 DIAGNOSIS — M6281 Muscle weakness (generalized): Secondary | ICD-10-CM | POA: Diagnosis not present

## 2024-05-11 DIAGNOSIS — R2689 Other abnormalities of gait and mobility: Secondary | ICD-10-CM

## 2024-05-11 DIAGNOSIS — M25551 Pain in right hip: Secondary | ICD-10-CM | POA: Diagnosis not present

## 2024-05-11 NOTE — Therapy (Signed)
OUTPATIENT PHYSICAL THERAPY LOWER EXTREMITY TREATMENT   Patient Name: [name] [name] MRN: [id number] DOB:1932-04-28, 89 y.o., male Today's Date: 05/11/2024   END OF SESSION:  PT End of Session - 05/11/24 1016     Visit Number 12    Number of Visits 16    Date for PT Re-Evaluation 05/25/24    Authorization Type [employer] medicare  PN/recert done at visit 10    PT Start Time 1015    PT Stop Time 1100    PT Time Calculation (min) 45 min    Activity Tolerance Patient tolerated treatment well    Behavior During Therapy WFL for tasks assessed/performed            Past Medical History:  Diagnosis Date   Anemia    Arthritis    lower back bulging disc, hips, knees, thumbs, shoulder   Asthma    as a pre teen   Bladder cancer (HCC) UROLOGIST-  DR MCKENZIE   S/P TURBT 08-16-2017   Bladder wall hemorrhage 09/28/2023   BPH (benign prostatic hyperplasia)    CKD (chronic kidney disease), stage III (HCC)    STAGE 3  B CKD per lov 10-16-2020 dr Cathalene Clipper Seretha Dance kidney on chart   Dyspnea    Fatigue    MIDDLE OF DAY ON OCCASION   Frequency of urination    GERD (gastroesophageal reflux disease)    on protonix    Hematuria    History of colon polyps    HLD (hyperlipidemia)    HOH (hard of hearing)    slightly hoh right worse thanleft   HTN (hypertension)    Hypothyroidism    Incontinence of urine    weras pads all the time   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   Right Buccal Cheek   Pre-diabetes    Recurrent bladder papillary carcinoma (HCC) hx 2012 and 2014-- urologist- dr Claretta Croft   s/p  TURBT 08-16-2017  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   Renal mass 09/02/2022   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 10/02/2023   Sleep apnea    uses cpap set on 20 /4   Trigger finger of left hand 07/04/2021   last few months per pt   UTI (urinary tract infection) 08/20/2020   Weakness of extremity    left knee   Wears glasses     Past Surgical History:  Procedure Laterality Date   BCG X 2     AFTER LAST 2 BLADDER TUMOR REMOVALS   CATARACT EXTRACTION, BILATERAL     COLONSCOPY  YRS AGO   CYSTOSCOPY N/A 10/29/2020   Procedure: CYSTOSCOPY;  Surgeon: Roxane Copp, MD;  Location: [facility] [place];  Service: Urology;  Laterality: N/A;   CYSTOSCOPY N/A 07/08/2021   Procedure: CYSTOSCOPY;  Surgeon: Roxane Copp, MD;  Location: [facility];  Service: Urology;  Laterality: N/A;   CYSTOSCOPY W/ RETROGRADES Right 07/21/2023   Procedure: CYSTOSCOPY WITH RIGHT RETROGRADE PYELOGRAM;  Surgeon: Melody Spurling., MD;  Location: [facility];  Service: Urology;  Laterality: Right;   CYSTOSCOPY W/ URETERAL STENT PLACEMENT Bilateral 08/16/2017   Procedure: CYSTOSCOPY WITH RETROGRADE PYELOGRAM/URETERAL STENT PLACEMENT;  Surgeon: Marco Severs, MD;  Location: [facility];  Service: Urology;  Laterality: Bilateral;   CYSTOSCOPY W/ URETERAL STENT PLACEMENT Left 09/02/2022   Procedure: CYSTOSCOPY WITH RETROGRADE PYELOGRAM, URETERAL STENT PLACEMENT;  Surgeon: Osborn Blaze, MD;  Location: [facility];  Service: Urology;  Laterality: Left;  CYSTOSCOPY WITH BIOPSY N/A 06/25/2020   Procedure: CYSTOSCOPY FULGURATION   BLADDER  BIOPSY;  Surgeon: Roxane Copp, MD;  Location: [facility];  Service: Urology;  Laterality: N/A;   CYSTOSCOPY WITH FULGERATION N/A 12/08/2019   Procedure: CYSTOSCOPY WITH FULGERATION;  Surgeon: Marco Severs, MD;  Location: [facility];  Service: Urology;  Laterality: N/A;   CYSTOSCOPY WITH FULGERATION N/A 03/24/2022   Procedure: CYSTOSCOPY WITH FULGERATION;  Surgeon: Roxane Copp, MD;  Location: [facility];  Service: Urology;  Laterality: N/A;   CYSTOSCOPY WITH RETROGRADE PYELOGRAM, URETEROSCOPY AND STENT PLACEMENT Left 03/24/2022   Procedure: CYSTOSCOPY WITH RETROGRADE PYELOGRAM, DIAGNOSTIC URETEROSCOPY WITH BIOPSY AND STENT  PLACEMENT;  Surgeon: Roxane Copp, MD;  Location: [facility];  Service: Urology;  Laterality: Left;  90 MINUTES   CYSTOSCOPY WITH RETROGRADE PYELOGRAM, URETEROSCOPY AND STENT PLACEMENT Left 04/07/2022   Procedure: CYSTOSCOPY WITH  RETROGRADE PYELOGRAM, DIAGNOSTIC URETEROSCOPY WITH BIOPSY AND STENT EXCHANGE;  Surgeon: Roxane Copp, MD;  Location: [facility];  Service: Urology;  Laterality: Left;  75 MINUTES   CYSTOSCOPY WITH RETROGRADE PYELOGRAM, URETEROSCOPY AND STENT PLACEMENT Left 08/11/2022   Procedure: CYSTOSCOPY WITH RETROGRADE PYELOGRAM, ANTEGRADE NEPHROSTOGRAM, DIAGNOSTIC  URETEROSCOPY;  Surgeon: Roxane Copp, MD;  Location: [facility];  Service: Urology;  Laterality: Left;  90 MINS   HOLMIUM LASER APPLICATION Left 08/11/2022   Procedure: HOLMIUM LASER APPLICATION;  Surgeon: Roxane Copp, MD;  Location: [facility];  Service: Urology;  Laterality: Left;   IR NEPHROSTOMY EXCHANGE LEFT  05/15/2022   IR NEPHROSTOMY EXCHANGE LEFT  07/10/2022   IR NEPHROSTOMY PLACEMENT LEFT  05/06/2022   KNEE ARTHROSCOPY Left 06/10/2011   LYMPH NODE DISSECTION Left 09/02/2022   Procedure: RETROPERITONEAL LYMPH NODE DISSECTION;  Surgeon: Osborn Blaze, MD;  Location: [facility];  Service: Urology;  Laterality: Left;   PROSTATE BIOPSY  2000   ROBOT ASSITED LAPAROSCOPIC NEPHROURETERECTOMY Left 09/02/2022   Procedure: XI ROBOT ASSITED LAPAROSCOPIC NEPHROURETERECTOMY;  Surgeon: Osborn Blaze, MD;  Location: [facility];  Service: Urology;  Laterality: Left;   ROBOT LAP RADICAL CYSTOPROSTATECTOMY PELVIC LYMPHADENECTOMY, NEOBLADDER N/A 02/04/2024   Procedure: ROBOT ASSISTED LAPAROSCOPIC RADICAL CYSTOPROSTATECTOMY;  Surgeon: Melody Spurling., MD;  Location: [facility];  Service: Urology;  Laterality: N/A;  300 MINUTES NEEDED FOR CASE   TRANSURETHRAL RESECTION OF BLADDER TUMOR N/A 08/16/2017   Procedure: TRANSURETHRAL RESECTION OF BLADDER TUMOR (TURBT);  Surgeon: Marco Severs, MD;  Location: [facility];   Service: Urology;  Laterality: N/A;   TRANSURETHRAL RESECTION OF BLADDER TUMOR  12-09-2012   dr Lavona Pounds [facility]   and TURP   TRANSURETHRAL RESECTION OF BLADDER TUMOR N/A 09/20/2017   Procedure: TRANSURETHRAL RESECTION OF BLADDER TUMOR (TURBT);  Surgeon: Marco Severs, MD;  Location: [facility];  Service: Urology;  Laterality: N/A;   TRANSURETHRAL RESECTION OF BLADDER TUMOR N/A 12/08/2019   Procedure: TRANSURETHRAL RESECTION OF BLADDER TUMOR;  Surgeon: Marco Severs, MD;  Location: [facility];  Service: Urology;  Laterality: N/A;  1 HR   TRANSURETHRAL RESECTION OF BLADDER TUMOR N/A 10/29/2020   Procedure: BLADDER FULGERATION;  Surgeon: Roxane Copp, MD;  Location: [facility];  Service: Urology;  Laterality: N/A;  30 MINS   TRANSURETHRAL RESECTION OF BLADDER TUMOR N/A 07/08/2021   Procedure: TRANSURETHRAL RESECTION OF BLADDER TUMOR (TURBT)/FULGERATION;  Surgeon: Roxane Copp, MD;  Location: [facility];  Service: Urology;  Laterality: N/A;  45 MINS  TRANSURETHRAL RESECTION OF BLADDER TUMOR N/A 12/02/2021   Procedure: TRANSURETHRAL RESECTION OF BLADDER TUMOR (TURBT);  Surgeon: Roxane Copp, MD;  Location: [facility];  Service: Urology;  Laterality: N/A;   TRANSURETHRAL RESECTION OF BLADDER TUMOR N/A 07/21/2023   Procedure: TRANSURETHRAL RESECTION OF BLADDER TUMOR (TURBT);  Surgeon: Melody Spurling., MD;  Location: [facility];  Service: Urology;  Laterality: N/A;  60 MINUTES   TRANSURETHRAL RESECTION OF BLADDER TUMOR N/A 10/01/2023   Procedure: TRANSURETHRAL RESECTION OF BLADDER TUMOR (TURBT)/CLOT EVACUATION WITH FULGERATION;  Surgeon: Melody Spurling., MD;  Location: [facility];  Service: Urology;  Laterality: N/A;   TRANSURETHRAL RESECTION OF PROSTATE  2011   UPPER GI ENDOSCOPY  YRS AGO   URETEROSCOPY Right 02/04/2024   Procedure: RIGHT CUTANEOUS URETEROSCOPY;  Surgeon: Melody Spurling., MD;  Location: [facility];  Service: Urology;  Laterality: Right;   Patient Active Problem List   Diagnosis Date Noted   Chronic right hip pain 03/20/2024   Prostatic adenocarcinoma (HCC) 03/03/2024   UTI (urinary tract infection) 03/02/2024   Complicated UTI (urinary tract infection) 03/02/2024   Ureterostomy status (HCC) 02/15/2024   History of iron deficiency 12/20/2023   Dysuria 10/15/2023   AKI (acute kidney injury) (HCC) 10/08/2023   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 10/02/2023   Decreased appetite 09/28/2023   Hardening of the aorta (main artery of the heart) (HCC) 09/28/2023   Mixed hyperlipidemia 09/28/2023   Prediabetes 09/28/2023   Panic attack 09/28/2023   Pedal edema 09/28/2023   Osteoarthritis 09/28/2023   Sinusitis chronic, sphenoidal 09/28/2023   Acute blood loss anemia (ABLA) 09/28/2023   Hypothyroidism 09/28/2023   Low serum vitamin B12 09/10/2023   Anemia of chronic disease 09/10/2023   Normocytic anemia 08/06/2023   Unintentional weight loss 04/09/2021   Leg weakness, bilateral 04/09/2021   Surgical counseling visit 04/09/2021   Essential hypertension 05/23/2020   Sleep apnea treated with nocturnal BiPAP 01/25/2020   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   Hypersomnia with sleep apnea 09/15/2019   Cardiac arrhythmia due to premature depolarization 09/15/2019   Excessive daytime sleepiness 09/15/2019   CKD (chronic kidney disease) stage 4, GFR 15-29 ml/min (HCC) - Baseline Scr 3.0 09/15/2019   Malignant neoplasm of urinary bladder (HCC) 09/15/2019   Gross hematuria 09/11/2017   Benign prostatic hyperplasia without lower urinary tract symptoms 12/09/2012   GERD (gastroesophageal reflux disease) 11/28/2012     PCP: Virgle Grime  REFERRING PROVIDER: Virgle Grime  REFERRING DIAG: Leg weakness   THERAPY DIAG:  Pain in right hip  Muscle weakness (generalized)  Other abnormalities of gait and mobility  Rationale for Evaluation and  Treatment: Rehabilitation  ONSET DATE:    SUBJECTIVE:   SUBJECTIVE STATEMENT:  Pt awaiting review of clearance for surgery. Not yet scheduled. Pt states continued and slightly worsening pain and stiffness. Feeling that he is getting weaker .  Eval:  Pt states weakness in legs R>L ongoing for a while. He had significant weakness previously from medication issues but has slightly improved. He states pain in his R hip, sharp pains with twisting at times, and instability feeling with walking and activity due to hip pain.He has had previous L knee surgery, states R hip OA, and has had recent surgery for cancer, with bladder and prostate removal. Since then he states feeling pretty good, he does have swelling in feet and legs, just saw Dr this week who was not concerned. He is wearing sandals today, with obvious  swelling in feet and legs.  He used to walk about 2 mi/day in past years, recently he did walk about 1.5 miles States minimal fatigue with endurance, but more limitation from R hip and leg weakness.  He is currently living with son and his wife in split level house. States doing ok with stairs for now. Has walking stick, but not using today, does usually use most of the time.    PERTINENT HISTORY: Recent bladder and prostate removal for CA, CKD,  Nephrectomy (L),    PAIN:  Are you having pain? Yes: NPRS scale: 5-6/10    Pain location: R hip/anterior/lateral Pain description: sore, stiff, shooting with twisting motion Aggravating factors: increased activity , initial standing  Relieving factors: none stated   PRECAUTIONS: None  WEIGHT BEARING RESTRICTIONS: No  FALLS:  Has patient fallen in last 6 months? No  PLOF: Independent  PATIENT GOALS:  Decreased pain in hip, improve walking and balance.   NEXT MD VISIT:   OBJECTIVE:  updated 5/22.  DIAGNOSTIC FINDINGS:   PATIENT SURVEYS:    COGNITION: Overall cognitive status: Within functional limits for tasks  assessed     SENSATION: WFL  EDEMA: mod edema in L lower leg, has been much improved overall in bil lower legs and feet in past 2 weeks.    POSTURE:    No Significant postural limitations  PALPATION:     LOWER EXTREMITY ROM: Hips:  L: mild limitation for flex, ER, mod for IR,    R:  Significant  limitation for all motions.   LOWER EXTREMITY MMT:  MMT Left eval Right  eval Left:  5/2 Right 5/2 Right  Hip flexion 4- 3- 4 3 /3- 3/3-  Hip extension       Hip abduction       Hip adduction       Hip internal rotation       Hip external rotation       Knee flexion 4 4   4+  Knee extension 4+ 4+   4+  Ankle dorsiflexion       Ankle plantarflexion       Ankle inversion       Ankle eversion        (Blank rows = not tested)  LOWER EXTREMITY SPECIAL TESTS:    FUNCTIONAL TESTS:    GAIT: Distance walked: 249ft Assistive device utilized: Walker - 2 wheeled Level of assistance: Complete Independence and Modified independence Comments: decreased step height when R hip painful.    TODAY'S TREATMENT:                                                                                                                              DATE:   05/11/2024 Therapeutic Exercise: Aerobic: Supine:   hip ER rom x 10;  PROM R hip, all motions.   Heel slides x 10 bil;  Marching / hip flexion on R with towel assist x 15; Aaron Aas  SAQ x 15 bil;  Seated:   LAQ x 20 bil, 3 lb on L;    Standing:  marching with walker x 15; knee flexion x 15 bil;   Stretches:    Neuromuscular Re-education: Manual Therapy:  LAD R hip;  Therapeutic Activity:   Self Care:   Gait:     PATIENT EDUCATION:  Education details: updated and reviewed HEP Person educated: Patient Education method: Explanation, Demonstration, Tactile cues, Verbal cues, and Handouts Education comprehension: verbalized understanding, returned demonstration, verbal cues required, tactile cues required, and needs further education   HOME  EXERCISE PROGRAM: Access Code: [email or web address]  ASSESSMENT:  CLINICAL IMPRESSION: Pt continues to have stiffness and pain in R hip. He is awaiting medical clearance for possible THA. We will hold PT at this time, while we wait for surgical plan. Pt in agreement with plan. Reviewed HEP today, pt with good ability to perform.    Eval: Patient presents with primary complaint of  pain in R hip, and weakness in R hip/LE. He has stiffness in bil hips, R>L, with lack of ROM. He has decreased strength in R hip vs L, with feelings of instability with activity. He has decreased gait mechanics, and will benefit from education on use of AD. He has decreased ability and stability with functional activity, gait, and stairs. He is overall deconditioned and will benefit from education on HEP . Pt with decreased ability for full functional activities. Pt will  benefit from skilled PT to improve deficits and pain and to return to PLOF.   OBJECTIVE IMPAIRMENTS: Abnormal gait, decreased activity tolerance, decreased balance, decreased knowledge of use of DME, decreased mobility, difficulty walking, decreased ROM, decreased strength, decreased safety awareness, impaired flexibility, improper body mechanics, and pain.   ACTIVITY LIMITATIONS: bending, standing, squatting, stairs, transfers, and locomotion level  PARTICIPATION LIMITATIONS: meal prep, cleaning, shopping, and community activity  PERSONAL FACTORS: Age, Past/current experiences, Time since onset of injury/illness/exacerbation, and 1-2 comorbidities: CA, recent surgery, OA are also affecting patient's functional outcome.   REHAB POTENTIAL: Good  CLINICAL DECISION MAKING: Evolving/moderate complexity  EVALUATION COMPLEXITY: Moderate   GOALS: Goals reviewed with patient? Yes  SHORT TERM GOALS: Target date: 03/16/2024  Pt to be independent with initial HEP  Goal status: MET   2.  Pt to demo independent transfer from regular chair, without use of UEs.    Goal status: MET    LONG TERM GOALS: Target date: 05/25/24  Pt to be independent with final HEP  Goal status: in progress  2.  Pt to demo improved strength of R hip to at least 4/5 for flex and abd, to improve ability and stability for gait.   Goal status: in progress  3.  Pt to report decreased pain to 0-2/10 in R hip with activity.   Goal status: in progress  4.  Pt to demo optimal mechanics and safety  for gait and stairs, with LRAD, to improve ability and safety with community activity.  Goal status: in progress    PLAN:  PT FREQUENCY: 1-2x/week  PT DURATION: 8 weeks  PLANNED INTERVENTIONS: Therapeutic exercises, Therapeutic activity, Neuromuscular re-education, Patient/Family education, Self Care, Joint mobilization, Joint manipulation, Stair training, Orthotic/Fit training, DME instructions, Aquatic Therapy, Dry Needling, Electrical stimulation, Cryotherapy, Moist heat, Taping, Ultrasound, Ionotophoresis 4mg /ml Dexamethasone , Manual therapy,  Vasopneumatic device, Traction, Spinal manipulation, Spinal mobilization,Balance training, Gait training,   PLAN FOR NEXT SESSION:  stairs - 8 steps split level-    Terrilee Few, PT, DPT 10:17 AM  05/11/24   

## 2024-05-12 NOTE — Progress Notes (Addendum)
 Cardiology Office Note:    Date:  05/18/2024   ID:  [name] [name], DOB 02/22/1932, MRN [id number]  PCP:  Verdia Lombard, MD  Cardiologist:  Shelda Bruckner, MD     Referring MD: Verdia Lombard, MD   Chief Complaint: pre-op evaluation   History of Present Illness:    [name] [name] is a 89 y.o. 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 Bruckner and presents today for pre-op evaluation.   Patient was referred to Dr. Bruckner in 03/2020 for further evaluation of lightheadedness, palpitations, and fatigue. Echo was ordered and showed LVEF of 55-60% with mild LVH but normal wall motion and diastolic parameters, normal RV size and function, mild AI, and mild dilatation of the ascending aorta measuring 39 mm. ETT was then completed in 05/2020 and was negative. He was admitted in 07/2020 after presenting with chest pain associated with diaphoresis, nausea, and shortness of breath that woke him up from sleep. D-dimer was elevated. CTA could not be performed due to renal function but VQ scan was negative for PE. Myoview  was low risk and negative for ischemia. Over the last couple of years, he has struggled more with issues with left renal pelvis cancer and recurrent bladder cancer. He underwent a left nephroureterectomy in 08/2022 and then cystoprostatectomy and right cutaneous ureterostomy in 01/2024 after multiple TURBT procedures. He has had progressive CKD and now has stage IV/V CKD.   He was last seen by Dr. Bruckner in 06/2023 at which time he was stable from a cardiac standpoint.  Patient  presents today for pre-op evaluation for total hip arthroplasty. Here with son.  He is overall doing well from a cardiac standpoint.  He denies any chest pain.  He reports mild dyspnea on exertion which he and his son really attribute to deconditioning with progressive weakness since taking Keytruda  for his bladder cancer and since his recent cystoprostatectomy in 01/2024.  Prior to this, he was able to walk a mile at a time in his neighborhood and had no shortness of breath with this.  His activity is now limited due to weakness and hip pain but he still able to complete 5.07 METS of physical activity. He also states he tolerated the recent surgery in 01/2024 well with no complications.  No shortness of breath at rest.  No orthopnea or PND.  He does have some bilateral lower extremity edema since his surgery in 01/2024.  He states that the surgeon said that this is to be expected and should gradually improve over time.  He does report a random sharp pain in his feet and calves.  However overall, symptoms are improving.  He does have 1+ pitting edema on exam though.  No palpitations, lightheadedness, dizziness, syncope.    EKGs/Labs/Other Studies Reviewed:    The following studies were reviewed:  Echocardiogram 05/03/2020: Impressions: 1. Left ventricular ejection fraction, by estimation, is 55 to 60%. The  left ventricle has normal function. The left ventricle has no regional  wall motion abnormalities. There is mild concentric left ventricular  hypertrophy. Left ventricular diastolic  parameters were normal.   2. Right ventricular systolic function is normal. The right ventricular  size is normal.  3. Left atrial size was mildly dilated.   4. The mitral valve is normal in structure. Trivial mitral valve  regurgitation. No evidence of mitral stenosis.   5. The aortic valve is tricuspid. Aortic valve regurgitation is mild.  Mild aortic valve sclerosis is present, with no evidence of aortic valve   stenosis.   6. There is mild dilatation of the ascending aorta measuring 39 mm.   7. The inferior vena cava is normal in size with greater than 50%  respiratory variability, suggesting right atrial pressure of 3 mmHg.  _______________  Exercise Tolerance Test 05/24/2020: Blood pressure demonstrated a hypertensive response to exercise. Upsloping ST segment depression ST segment depression was noted during stress in the III, II, aVF, V6, V5 and V4 leads. Negative, adequate stress test. _______________  Myoview  08/22/2020: Impression: 1. No reversible ischemia or infarction. 2. Normal left ventricular wall motion. 3. Left ventricular ejection fraction 61% 4. Non invasive risk stratification*: Low   EKG:  EKG ordered today.   EKG Interpretation Date/Time:  Thursday May 18 2024 08:47:59 EDT Ventricular Rate:  87 PR Interval:  152 QRS Duration:  84 QT Interval:  376 QTC Calculation: 452 R Axis:   27  Text Interpretation: Normal sinus rhythm  No acute ST/ T wave changes No significant change since last tracing Confirmed by Jadine Patient [phone]) on 05/18/2024 9:04:27 AM    Recent Labs: 03/03/2024: Magnesium  1.4 03/06/2024: ALT 16; Hemoglobin 10.3; Platelet Count 207; TSH 4.176 04/04/2024: BUN 56; Creatinine 3.88; Potassium 4.5; Sodium 141  Recent Lipid Panel    Component Value Date/Time   CHOL 117 08/20/2020 1908   TRIG 54 08/20/2020 1908   HDL 43 08/20/2020 1908   CHOLHDL 2.7 08/20/2020 1908   VLDL 11 08/20/2020 1908   LDLCALC 63 08/20/2020 1908    Physical Exam:    Vital Signs: BP 110/64   Pulse 87   Ht 5' 8 (1.727 m)   Wt 165 lb (74.8 kg)   SpO2 99%   BMI 25.09 kg/m     Wt Readings from Last 3 Encounters:  05/18/24 165 lb (74.8 kg)  04/04/24 171 lb 11.2 oz (77.9 kg)  03/20/24 174 lb (78.9 kg)     General: 89 y.o. Caucasian male in no acute distress. HEENT: Normocephalic and atraumatic. Sclera clear.  Neck: Supple. No carotid bruits. No JVD. Heart: RRR.  Distinct S1 and S2. No murmurs, gallops, or rubs.  Lungs: No increased work of breathing. Clear to ausculation bilaterally. No wheezes, rhonchi, or rales.  Extremities: 1+ edema of bilateral lower extremities edema.   Skin: Warm and dry. Neuro: No focal deficits. Psych: Normal affect. Responds appropriately.   Assessment:    1. Preop examination   2. Mild aortic insufficiency   3. Mild dilation of ascending aorta (HCC)   4. Primary hypertension   5. Hyperlipidemia, unspecified hyperlipidemia type   6. Chronic kidney disease (CKD), stage IV (severe) (HCC)   7. Obstructive sleep apnea   8. Bilateral lower extremity edema     Plan:    Pre-Op Evaluation  Patient has upcoming total hip arthroplasty planned.  He is doing well from a cardiac standpoint.  He has some mild dyspnea on exertion primarily due to deconditioning and weakness from recent cancer treatment.  No chest pain.  He is activity is limited by significant weakness but he is still able to complete at least 5.07 METS of physical activity without any chest pain or significant shortness of breath.  Revised Cardiac  Risk Index - 1 (renal function) indicating a 6.0% risk of major cardiac event perioperatively.  Functional 5.7% he does report some new lower extremity edema after recent surgery for bladder cancer in 01/2024.  I have a low suspicion for DVT but will get venous Dopplers to rule this out given the fact that he recently had surgery for cancer and the fact that he is much more sedentary than usual.  He is otherwise well optimized from a cardiac standpoint and can proceed with procedure as long as lower extremity Dopplers are negative.  Bilateral Lower Extremity Edema Patient reports bilateral lower extremity edema over the last 3 months since surgery for bladder cancer.  He states that the surgeon told him that this is to be expected and should gradually improve.  Overall, edema has improved but he still has 1+ pitting edema on  exam.  He also reports random sharp episodes of pain in his feet and calfs. - Overall, I have a low suspicion for DVT.  However, he recently had surgery and is much more sedentary than usual.  He states he is currently cancer free but recent malignancy also increases hypercoagulable risk.  Will get lower extremity venous dopplers to rule out DVT. - If dopplers are negative for DVT, will treat lower extremity conservatively and recommend compression stockings.  Given CKD stage IV/V, would like to hold off on diuretics.  He is on Amlodipine  10mg  daily which can cause edema so could also consider decreasing the dose of this.  Mild Aortic Insufficiency Mild Dilatation of the Ascending Aorta Echo in 04/2020 showed normal LV function with mild AI and mild dilatation of the ascending aorta measuring 39 mm. - No murmur noted on exam. - Will repeat Echo for routine monitoring.  This does not need to be done prior to hip surgery.  Hypertension BP well controlled.  - Continue Amlodipine  10mg  daily.   Hyperlipidemia Lipid panel in 03/2023: Total Cholesterol 161, Triglycerides 104, HDL 61, LDL 83.  - Continue Crestor  5mg  daily.  - Labs followed by PCP.  CKD Stage IV Baseline creatinine around 3.0 to 4.0. Most recent creatinine 3.88 on 04/04/2024.  - Followed by Nephrology.   Obstructive Sleep Apnea - Continue BiPAP.   Disposition: Follow up in 6 months.   Bonney Aline FORBES Jadine, PA-C  05/18/2024 9:50 AM    [facility]  ADDENDUM 06/12/2024: Lower extremity venous dopplers negative for DVT. Echo showed LVEF of 60-65% with normal wall motion and grade 1 diastolic dysfunction, normal RV function, and mild AI.  Patient was called with the results and reported lower extremity edema had essentially resolved. No new recommendations at this time.  Ok to proceed with planned hip surgery. I will route this recommendation to the requesting party via Epic fax function.  Areyana Leoni E Cardale Dorer,  PA-C 06/12/2024 3:46 PM

## 2024-05-15 DIAGNOSIS — N184 Chronic kidney disease, stage 4 (severe): Secondary | ICD-10-CM | POA: Diagnosis not present

## 2024-05-15 DIAGNOSIS — C678 Malignant neoplasm of overlapping sites of bladder: Secondary | ICD-10-CM | POA: Diagnosis not present

## 2024-05-15 DIAGNOSIS — I700 Atherosclerosis of aorta: Secondary | ICD-10-CM | POA: Diagnosis not present

## 2024-05-15 DIAGNOSIS — R600 Localized edema: Secondary | ICD-10-CM | POA: Diagnosis not present

## 2024-05-15 DIAGNOSIS — I129 Hypertensive chronic kidney disease with stage 1 through stage 4 chronic kidney disease, or unspecified chronic kidney disease: Secondary | ICD-10-CM | POA: Diagnosis not present

## 2024-05-15 DIAGNOSIS — E039 Hypothyroidism, unspecified: Secondary | ICD-10-CM | POA: Diagnosis not present

## 2024-05-15 DIAGNOSIS — D649 Anemia, unspecified: Secondary | ICD-10-CM | POA: Diagnosis not present

## 2024-05-15 DIAGNOSIS — R7309 Other abnormal glucose: Secondary | ICD-10-CM | POA: Diagnosis not present

## 2024-05-15 DIAGNOSIS — R5383 Other fatigue: Secondary | ICD-10-CM | POA: Diagnosis not present

## 2024-05-15 DIAGNOSIS — I10 Essential (primary) hypertension: Secondary | ICD-10-CM | POA: Diagnosis not present

## 2024-05-15 DIAGNOSIS — C662 Malignant neoplasm of left ureter: Secondary | ICD-10-CM | POA: Diagnosis not present

## 2024-05-15 DIAGNOSIS — E782 Mixed hyperlipidemia: Secondary | ICD-10-CM | POA: Diagnosis not present

## 2024-05-18 ENCOUNTER — Encounter: Payer: Self-pay | Admitting: Student

## 2024-05-18 ENCOUNTER — Ambulatory Visit: Attending: Student | Admitting: Student

## 2024-05-18 VITALS — BP 110/64 | HR 87 | Ht 68.0 in | Wt 165.0 lb

## 2024-05-18 DIAGNOSIS — G4733 Obstructive sleep apnea (adult) (pediatric): Secondary | ICD-10-CM | POA: Diagnosis not present

## 2024-05-18 DIAGNOSIS — I351 Nonrheumatic aortic (valve) insufficiency: Secondary | ICD-10-CM | POA: Diagnosis not present

## 2024-05-18 DIAGNOSIS — E785 Hyperlipidemia, unspecified: Secondary | ICD-10-CM | POA: Diagnosis not present

## 2024-05-18 DIAGNOSIS — N184 Chronic kidney disease, stage 4 (severe): Secondary | ICD-10-CM

## 2024-05-18 DIAGNOSIS — R600 Localized edema: Secondary | ICD-10-CM

## 2024-05-18 DIAGNOSIS — Z01818 Encounter for other preprocedural examination: Secondary | ICD-10-CM | POA: Diagnosis not present

## 2024-05-18 DIAGNOSIS — I10 Essential (primary) hypertension: Secondary | ICD-10-CM

## 2024-05-18 DIAGNOSIS — I77810 Thoracic aortic ectasia: Secondary | ICD-10-CM | POA: Diagnosis not present

## 2024-05-18 NOTE — Patient Instructions (Signed)
 Medication Instructions:  Your physician recommends that you continue on your current medications as directed. Please refer to the Current Medication list given to you today.  *If you need a refill on your cardiac medications before your next appointment, please call your pharmacy*  Lab Work: NONE If you have labs (blood work) drawn today and your tests are completely normal, you will receive your results only by: MyChart Message (if you have MyChart) OR A paper copy in the mail If you have any lab test that is abnormal or we need to change your treatment, we will call you to review the results.  Testing/Procedures: Your physician has requested that you have an echocardiogram. Echocardiography is a painless test that uses sound waves to create images of your heart. It provides your doctor with information about the size and shape of your heart and how well your heart's chambers and valves are working. This procedure takes approximately one hour. There are no restrictions for this procedure. Please do NOT wear cologne, perfume, aftershave, or lotions (deodorant is allowed). Please arrive 15 minutes prior to your appointment time.  Please note: We ask at that you not bring children with you during ultrasound (echo/ vascular) testing. Due to room size and safety concerns, children are not allowed in the ultrasound rooms during exams. Our front office staff cannot provide observation of children in our lobby area while testing is being conducted. An adult accompanying a patient to their appointment will only be allowed in the ultrasound room at the discretion of the ultrasound technician under special circumstances. We apologize for any inconvenience.   Your physician has requested that you have a lower extremity venous duplex. This test is an ultrasound of the veins in the legs or arms. It looks at venous blood flow that carries blood from the heart to the legs or arms. Allow one hour for a Lower  Venous exam. Allow thirty minutes for an Upper Venous exam. There are no restrictions or special instructions.  Please note: We ask at that you not bring children with you during ultrasound (echo/ vascular) testing. Due to room size and safety concerns, children are not allowed in the ultrasound rooms during exams. Our front office staff cannot provide observation of children in our lobby area while testing is being conducted. An adult accompanying a patient to their appointment will only be allowed in the ultrasound room at the discretion of the ultrasound technician under special circumstances. We apologize for any inconvenience.  Follow-Up: At [facility], you and your health needs are our priority.  As part of our continuing mission to provide you with exceptional heart care, our providers are all part of one team.  This team includes your primary Cardiologist (physician) and Advanced Practice Providers or APPs (Physician Assistants and Nurse Practitioners) who all work together to provide you with the care you need, when you need it.  Your next appointment:   6 month(s)  Provider:   Shelda Bruckner, MD   We recommend signing up for the patient portal called MyChart.  Sign up information is provided on this After Visit Summary.  MyChart is used to connect with patients for Virtual Visits (Telemedicine).  Patients are able to view lab/test results, encounter notes, upcoming appointments, etc.  Non-urgent messages can be sent to your provider as well.   To learn more about what you can do with MyChart, go to [email or web address].

## 2024-05-30 DIAGNOSIS — N189 Chronic kidney disease, unspecified: Secondary | ICD-10-CM | POA: Diagnosis not present

## 2024-05-30 DIAGNOSIS — I129 Hypertensive chronic kidney disease with stage 1 through stage 4 chronic kidney disease, or unspecified chronic kidney disease: Secondary | ICD-10-CM | POA: Diagnosis not present

## 2024-05-30 DIAGNOSIS — E875 Hyperkalemia: Secondary | ICD-10-CM | POA: Diagnosis not present

## 2024-05-30 DIAGNOSIS — N184 Chronic kidney disease, stage 4 (severe): Secondary | ICD-10-CM | POA: Diagnosis not present

## 2024-05-30 DIAGNOSIS — N2581 Secondary hyperparathyroidism of renal origin: Secondary | ICD-10-CM | POA: Diagnosis not present

## 2024-05-30 DIAGNOSIS — Z905 Acquired absence of kidney: Secondary | ICD-10-CM | POA: Diagnosis not present

## 2024-05-30 DIAGNOSIS — C679 Malignant neoplasm of bladder, unspecified: Secondary | ICD-10-CM | POA: Diagnosis not present

## 2024-05-30 DIAGNOSIS — D631 Anemia in chronic kidney disease: Secondary | ICD-10-CM | POA: Diagnosis not present

## 2024-05-30 DIAGNOSIS — E872 Acidosis, unspecified: Secondary | ICD-10-CM | POA: Diagnosis not present

## 2024-06-02 DIAGNOSIS — Z936 Other artificial openings of urinary tract status: Secondary | ICD-10-CM | POA: Diagnosis not present

## 2024-06-07 ENCOUNTER — Ambulatory Visit (HOSPITAL_BASED_OUTPATIENT_CLINIC_OR_DEPARTMENT_OTHER)
Admission: RE | Admit: 2024-06-07 | Discharge: 2024-06-07 | Disposition: A | Discharge status: Home / Self Care | Source: Ambulatory Visit | Attending: Student | Admitting: Student

## 2024-06-07 ENCOUNTER — Ambulatory Visit (HOSPITAL_COMMUNITY)
Admission: RE | Admit: 2024-06-07 | Discharge: 2024-06-07 | Disposition: A | Discharge status: Home / Self Care | Source: Ambulatory Visit | Attending: Student | Admitting: Student

## 2024-06-07 DIAGNOSIS — R600 Localized edema: Secondary | ICD-10-CM | POA: Diagnosis not present

## 2024-06-07 DIAGNOSIS — I351 Nonrheumatic aortic (valve) insufficiency: Secondary | ICD-10-CM | POA: Diagnosis not present

## 2024-06-07 DIAGNOSIS — I77810 Thoracic aortic ectasia: Secondary | ICD-10-CM | POA: Diagnosis not present

## 2024-06-07 LAB — ECHOCARDIOGRAM COMPLETE
AR max vel: 2.18 cm2
AV Area VTI: 2.11 cm2
AV Area mean vel: 2.18 cm2
AV Mean grad: 3 mmHg
AV Peak grad: 5.7 mmHg
Ao pk vel: 1.19 m/s
Area-P 1/2: 1.79 cm2
P 1/2 time: 841 ms
S' Lateral: 2.14 cm

## 2024-06-12 ENCOUNTER — Telehealth

## 2024-06-12 ENCOUNTER — Ambulatory Visit: Payer: Self-pay | Admitting: Student

## 2024-06-13 ENCOUNTER — Other Ambulatory Visit: Payer: Self-pay

## 2024-06-13 NOTE — Patient Outreach (Signed)
 Complex Care Management   Visit Note  06/13/2024  Name:  [name] [name] MRN: [id number] DOB: 26-Feb-1932  Situation: Referral received for Complex Care Management related to Chronic Kidney Disease and Hip Pain. I obtained verbal consent from Patient.  Visit completed with [name] [name]  on the phone.  Main concern is being cleared for right hip replacement surgery, states Cardio has cleared him and he is awaiting call from Ortho surgeon for plan.  He is also seeing Dr. Dolan at [facility] who has scheduled him for kidney classes on 07/14/24.     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   BPH (benign prostatic hyperplasia)    CKD (chronic kidney disease), stage III (HCC)    STAGE 3  B CKD per lov 10-16-2020 dr mateo dolan [facility] on chart   Dyspnea    Fatigue    MIDDLE OF DAY ON OCCASION   Frequency of urination    GERD (gastroesophageal reflux disease)    on protonix    Hematuria    History of colon polyps    HLD (hyperlipidemia)    HOH (hard of hearing)    slightly hoh right worse thanleft   HTN (hypertension)    Hypothyroidism    Incontinence of urine    weras pads all the time   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   Right Buccal Cheek   Pre-diabetes    Recurrent bladder papillary carcinoma (HCC) hx 2012 and 2014-- urologist- dr sherrilee   s/p  TURBT 08-16-2017  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   Renal mass 09/02/2022   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 10/02/2023   Sleep apnea    uses cpap set on 20 /4   Trigger finger of left hand 07/04/2021   last few months per pt   UTI (urinary tract infection) 08/20/2020   Weakness of extremity    left knee   Wears glasses      Assessment: Patient Reported Symptoms:  Cognitive Cognitive Status: Alert and oriented to person, place, and time, Normal speech and language skills, Insightful and able to interpret abstract concepts      Neurological Neurological Review of Symptoms: No symptoms reported    HEENT HEENT Symptoms Reported: Other: (Report he sometimes has a white light that flashes within his eye, doesn't happen every day, he will keep track of episodes and report to eye MD..)      Cardiovascular Cardiovascular Symptoms Reported: No symptoms reported (Reports swelling has greatly improved, doesn't see any swelling today)    Respiratory Respiratory Symptoms Reported: No symptoms reported    Endocrine Endocrine Symptoms Reported: Not assessed    Gastrointestinal Gastrointestinal Symptoms Reported: Constipation Additional Gastrointestinal Details: uses Senna and Metamucil for constipation.  Bowel plan: will get more aggressive if no BM by day 4, suppository or enema. Gastrointestinal Self-Management Outcome: 4 (good)    Genitourinary Genitourinary Symptoms Reported: Other Additional Genitourinary Details: Bladder Cancer, has urostomy Genitourinary Management Strategies: Urostomy  Integumentary Integumentary Symptoms Reported: Not assessed    Musculoskeletal Musculoskelatal Symptoms Reviewed: Weakness Additional Musculoskeletal Details: Continues to have general weakness due to right hip pain.  Awaiting full clearance for right hip replacement  surgery.   Falls in the past year?: No    Psychosocial Psychosocial Symptoms Reported: No symptoms reported            04/20/2024    3:18 PM  Depression screen PHQ 2/9  Decreased Interest 0  Down, Depressed, Hopeless 0  PHQ - 2 Score 0    There were no vitals filed for this visit.  Medications Reviewed Today     Reviewed by Lucian Santana LABOR, RN (Registered Nurse) on 06/13/24 at 1400  Med List Status: <None>   Medication Order Taking? Sig  Documenting Provider Last Dose Status Informant  acetaminophen  (TYLENOL ) 650 MG CR tablet [id number] Yes Take 650 mg by mouth every 8 (eight) hours as needed for pain. [provider]  Active   amLODipine  (NORVASC ) 10 MG tablet [id number]  Take 1 tablet (10 mg total) by mouth daily. Gonfa, Taye T, MD  Active Self  ferrous sulfate  325 (65 FE) MG tablet [id number]  Take 325 mg by mouth daily with supper. [provider]  Active Self  finasteride  (PROSCAR ) 5 MG tablet [id number]  Take 5 mg by mouth every evening. [provider]  Active Self  levothyroxine  (SYNTHROID , LEVOTHROID) 50 MCG tablet [id number]  Take 50 mcg by mouth daily before breakfast. [provider]  Active Self           Med Note HARLE, ANAIS   Sat Sep 11, 2017  8:33 AM)    pantoprazole  (PROTONIX ) 20 MG tablet [id number]  Take 20 mg by mouth at bedtime. [provider]  Active Self           Med Note (ABDAL-RAFI, MUHAMMAD I   Thu Mar 02, 2024  3:26 AM)    psyllium (METAMUCIL) 58.6 % powder [id number] Yes Take 0.5 packets by mouth every evening. [provider]  Active Self           Med Note SYDELL, BARBARA B   Wed Feb 09, 2024 11:06 AM) Does not feel it works and has had diarrhea.   rosuvastatin  (CRESTOR ) 5 MG tablet [id number]  Take 5 mg by mouth at bedtime. [provider]  Active Self  senna-docusate (SENOKOT-S) 8.6-50 MG tablet [id number] Yes Take 1 tablet by mouth 2 (two) times daily. While taking strong pain meds to prevent constipation Manny, Ricardo KATHEE Raddle., MD  Active Self           Med Note [facility], [facility] I   Thu Mar 02, 2024  3:28 AM)    traMADol (ULTRAM) 50 MG tablet [id number] Yes Take 50 mg by mouth 2 (two) times daily as needed. [provider]  Active             Recommendation:   Specialty provider follow-up : [facility] on 07/14/24 for kidney classes  -Patient will also follow up with Ortho Surgeon for status of the clearance and  scheduling for hip replacement surgery.   -Patient has agreed to inform [facility] MD of any med changes, including OTC meds. He is checking office visit summary from most recent visit to review recommended dose of Tylenol .    Follow Up Plan:   Telephone follow-up in 1 month  Santana Lucian BSN, CCM [place]  VBCI Population Health RN Care Manager Direct Dial: [phone]  Fax: [phone]

## 2024-06-13 NOTE — Patient Instructions (Signed)
 Visit Information  Thank you for taking time to visit with me today. Please don't hesitate to contact me if I can be of assistance to you before our next scheduled appointment.  Your next care management appointment is by telephone on Wednesday, August 20th  at 10:45am.   Please call the care guide team at [phone] if you need to cancel, schedule, or reschedule an appointment.   A reminder to ALL patients/family/friends, please call the [place]  National Suicide Prevention Lifeline: 929 325 1304 or TTY: (331) 288-0532 TTY [phone]) to talk to a trained counselor if you are experiencing a Mental Health or Behavioral Health Crisis or need someone to talk to.  Santana Stamp BSN, CCM [place]  VBCI Population Health RN Care Manager Direct Dial: [phone]  Fax: [phone]

## 2024-06-14 ENCOUNTER — Inpatient Hospital Stay (HOSPITAL_COMMUNITY): Admission: RE | Admit: 2024-06-14 | Source: Ambulatory Visit

## 2024-06-14 DIAGNOSIS — L821 Other seborrheic keratosis: Secondary | ICD-10-CM | POA: Diagnosis not present

## 2024-06-14 DIAGNOSIS — L728 Other follicular cysts of the skin and subcutaneous tissue: Secondary | ICD-10-CM | POA: Diagnosis not present

## 2024-06-14 DIAGNOSIS — L570 Actinic keratosis: Secondary | ICD-10-CM | POA: Diagnosis not present

## 2024-06-14 DIAGNOSIS — L814 Other melanin hyperpigmentation: Secondary | ICD-10-CM | POA: Diagnosis not present

## 2024-06-14 DIAGNOSIS — Z85828 Personal history of other malignant neoplasm of skin: Secondary | ICD-10-CM | POA: Diagnosis not present

## 2024-06-14 DIAGNOSIS — Z08 Encounter for follow-up examination after completed treatment for malignant neoplasm: Secondary | ICD-10-CM | POA: Diagnosis not present

## 2024-06-14 DIAGNOSIS — D225 Melanocytic nevi of trunk: Secondary | ICD-10-CM | POA: Diagnosis not present

## 2024-06-20 ENCOUNTER — Other Ambulatory Visit: Payer: Self-pay | Admitting: Physician Assistant

## 2024-06-20 DIAGNOSIS — Z01818 Encounter for other preprocedural examination: Secondary | ICD-10-CM

## 2024-07-04 NOTE — Patient Instructions (Addendum)
 SURGICAL WAITING ROOM VISITATION Patients having surgery or a procedure may have no more than 2 support people in the waiting area - these visitors may rotate in the visitor waiting room.   If the patient needs to stay at the hospital during part of their recovery, the visitor guidelines for inpatient rooms apply.  PRE-OP VISITATION  Pre-op nurse will coordinate an appropriate time for 1 support person to accompany the patient in pre-op.  This support person may not rotate.  This visitor will be contacted when the time is appropriate for the visitor to come back in the pre-op area.  Please refer to the [facility] website for the visitor guidelines for Inpatients (after your surgery is over and you are in a regular room).  You are not required to quarantine at this time prior to your surgery. However, you must do this: Hand Hygiene often Do NOT share personal items Notify your provider if you are in close contact with someone who has COVID or you develop fever 100.4 or greater, new onset of sneezing, cough, sore throat, shortness of breath or body aches.  If you test positive for Covid or have been in contact with anyone that has tested positive in the last 10 days please notify you surgeon.    Your procedure is scheduled on:  Friday July 14, 2024  Report to [facility] Entr[facility]: [place] entrance where the [employer] is available.   Report to admitting at:  07:15   AM  Call this number if you have any questions or problems the morning of surgery [phone]  Do not eat food after Midnight the night prior to your surgery/procedure.  After Midnight you may have the following liquids until   06:45 AM  DAY OF SURGERY  Clear Liquid Diet Water  Black Coffee (sugar ok, NO MILK/CREAM OR CREAMERS)  Tea (sugar ok, NO MILK/CREAM OR CREAMERS) regular and decaf                             Plain Jell-O  with no fruit (NO RED)                                           Fruit ices  (not with fruit pulp, NO RED)                                     Popsicles (NO RED)                                                                  Juice: NO CITRUS JUICES: only apple, WHITE grape, WHITE cranberry Sports drinks like Gatorade or Powerade (NO RED)                   The day of surgery:  Drink ONE (1) Pre-Surgery G2 at   06:45  AM the morning of surgery. Drink in one sitting. Do not sip.  This drink was given to you during your hospital pre-op appointment visit. Nothing else to drink  after completing the Pre-Surgery G2 : No candy, chewing gum or throat lozenges.    FOLLOW ANY ADDITIONAL PRE OP INSTRUCTIONS YOU RECEIVED FROM YOUR SURGEON'S OFFICE!!!   Oral Hygiene is also important to reduce your risk of infection.        Remember - BRUSH YOUR TEETH THE MORNING OF SURGERY WITH YOUR REGULAR TOOTHPASTE  Do NOT smoke after Midnight the night before surgery.  STOP TAKING all Vitamins, Herbs and supplements 1 week before your surgery.   Take ONLY these medicines the morning of surgery with A SIP OF WATER : Levothyroxine ,  and you may take EITHER Tylenol  OR Tramadol if needed for pain  If You have been diagnosed with Sleep Apnea - Bring CPAP mask and tubing day of surgery. We will provide you with a CPAP machine on the day of your surgery.                   You may not have any metal on your body including jewelry, and body piercing  Do not wear lotions, powders,  cologne, or deodorant  Men may shave face and neck.  Contacts, Hearing Aids, dentures or bridgework may not be worn into surgery. DENTURES WILL BE REMOVED PRIOR TO SURGERY PLEASE DO NOT APPLY Poly grip OR ADHESIVES!!!  You may bring a small overnight bag with you on the day of surgery, only pack items that are not valuable. [facility] IS NOT RESPONSIBLE   FOR VALUABLES THAT ARE LOST OR STOLEN.   Do not bring your home medications to the hospital. The Pharmacy will dispense medications listed on your medication  list to you during your admission in the Hospital.  Please read over the following fact sheets you were given: IF YOU HAVE QUESTIONS ABOUT YOUR PRE-OP INSTRUCTIONS, PLEASE CALL [phone].     Pre-operative 5 CHG Bath Instructions   You can play a key role in reducing the risk of infection after surgery. Your skin needs to be as free of germs as possible. You can reduce the number of germs on your skin by washing with CHG (chlorhexidine  gluconate) soap before surgery. CHG is an antiseptic soap that kills germs and continues to kill germs even after washing.   DO NOT use if you have an allergy to chlorhexidine /CHG or antibacterial soaps. If your skin becomes reddened or irritated, stop using the CHG and notify one of our RNs at [phone]  Please shower with the CHG soap starting 4 days before surgery using the following schedule: START SHOWERS ON   Monday  July 10, 2024  Please keep in mind the following:  DO NOT shave, including legs and underarms, starting the day of your first shower.   You may shave your face at any point before/day of surgery.   Place clean sheets on your bed the day you start using CHG soap. Use a clean washcloth (not used since being washed) for each shower. DO NOT sleep with pets once you start using the CHG.   CHG Shower Instructions:  If you choose to wash your hair and private area, wash first with your normal shampoo/soap.  After you use shampoo/soap, rinse your hair and body thoroughly to remove shampoo/soap residue.  Turn the water  OFF and apply about 3 tablespoons (45 ml) of CHG soap to a CLEAN washcloth.  Apply CHG soap ONLY FROM YOUR NECK DOWN TO YOUR TOES (washing for 3-5 minutes)  DO NOT use CHG soap on face, private areas, open wounds, or sores.  Pay special attention to the  area where your surgery is being performed.  If you are having back surgery, having someone wash your back for you may be helpful.  Wait 2 minutes after CHG soap is applied, then you may rinse off the CHG soap.  Pat dry with a clean towel  Put on clean clothes/pajamas   If you choose to wear lotion, please use ONLY the CHG-compatible lotions on the back of this paper.     Additional instructions for the day of surgery: DO NOT APPLY any lotions, deodorants, cologne, or perfumes.   Put on clean/comfortable clothes.  Brush your teeth.  Ask your nurse before applying any prescription medications to the skin.      CHG Compatible Lotions   Aveeno Moisturizing lotion  Cetaphil Moisturizing Cream  Cetaphil Moisturizing Lotion  Clairol Herbal Essence Moisturizing Lotion, Dry Skin  Clairol Herbal Essence Moisturizing Lotion, Extra Dry Skin  Clairol Herbal Essence Moisturizing Lotion, Normal Skin  Curel Age Defying Therapeutic Moisturizing Lotion with Alpha Hydroxy  Curel Extreme Care Body Lotion  Curel Soothing Hands Moisturizing Hand Lotion  Curel Therapeutic Moisturizing Cream, Fragrance-Free  Curel Therapeutic Moisturizing Lotion, Fragrance-Free  Curel Therapeutic Moisturizing Lotion, Original Formula  Eucerin Daily Replenishing Lotion  Eucerin Dry Skin Therapy Plus Alpha Hydroxy Crme  Eucerin Dry Skin Therapy Plus Alpha Hydroxy Lotion  Eucerin Original Crme  Eucerin Original Lotion  Eucerin Plus Crme Eucerin Plus Lotion  Eucerin TriLipid Replenishing Lotion  Keri Anti-Bacterial Hand Lotion  Keri Deep Conditioning Original Lotion Dry Skin Formula Softly Scented  Keri Deep Conditioning Original Lotion, Fragrance Free Sensitive Skin Formula  Keri Lotion Fast Absorbing Fragrance Free Sensitive Skin Formula  Keri Lotion Fast Absorbing Softly Scented Dry Skin Formula  Keri Original Lotion  Keri Skin Renewal Lotion Keri Silky Smooth Lotion  Keri Silky Smooth Sensitive Skin  Lotion  Nivea Body Creamy Conditioning Oil  Nivea Body Extra Enriched Lotion  Nivea Body Original Lotion  Nivea Body Sheer Moisturizing Lotion Nivea Crme  Nivea Skin Firming Lotion  NutraDerm 30 Skin Lotion  NutraDerm Skin Lotion  NutraDerm Therapeutic Skin Cream  NutraDerm Therapeutic Skin Lotion  ProShield Protective Hand Cream  Provon moisturizing lotion   FAILURE TO FOLLOW THESE INSTRUCTIONS MAY RESULT IN THE CANCELLATION OF YOUR SURGERY  PATIENT SIGNATURE_________________________________  NURSE SIGNATURE__________________________________  ________________________________________________________________________        [name] [name]    An incentive spirometer is a tool that can help keep your lungs clear and active. This tool measures how well you are filling your lungs with each breath.  Taking long deep breaths may help reverse or decrease the chance of developing breathing (pulmonary) problems (especially infection) following: A long period of time when you are unable to move or be active. BEFORE THE PROCEDURE  If the spirometer includes an indicator to show your best effort, your nurse or respiratory therapist will set it to a desired goal. If possible, sit up straight or lean slightly forward. Try not to slouch. Hold the incentive spirometer in an upright position. INSTRUCTIONS FOR USE  Sit on the edge of your bed if possible, or sit up as far as you can in bed or on a chair. Hold the incentive spirometer in an upright position. Breathe out normally. Place the mouthpiece in your mouth and seal your lips tightly around it. Breathe in slowly and as deeply as possible, raising the piston or the ball toward the top of the column. Hold your breath for 3-5 seconds or for as long as possible. Allow the piston or ball to fall to the bottom of the column. Remove the mouthpiece from your mouth and breathe out normally. Rest for a few seconds and repeat Steps 1  through 7 at least 10 times every 1-2 hours when you are awake. Take your time and take a few normal breaths between deep breaths. The spirometer may include an indicator to show your best effort. Use the indicator as a goal to work toward during each repetition. After each set of 10 deep breaths, practice coughing to be sure your lungs are clear. If you have an incision (the cut made at the time of surgery), support your incision when coughing by placing a pillow or rolled up towels firmly against it. Once you are able to get out of bed, walk around indoors and cough well. You may stop using the incentive spirometer when instructed by your caregiver.  RISKS AND COMPLICATIONS Take your time so you do not get dizzy or light-headed. If you are in pain, you may need to take or ask for pain medication before doing incentive spirometry. It is harder to take a deep breath if you are having pain. AFTER USE Rest and breathe slowly and easily. It can be helpful to keep track of a log of your progress. Your caregiver can provide you with a simple table to help with this. If you are using the spirometer at home, follow these instructions: SEEK MEDICAL CARE IF:  You are having difficultly using the spirometer. You have trouble using the spirometer as often as instructed. Your pain medication is not giving enough relief while using the spirometer. You develop fever of 100.5 F (38.1 C) or higher.                                                                                                    SEEK IMMEDIATE MEDICAL CARE IF:  You cough up bloody sputum that had not been present before. You develop fever of 102 F (38.9 C) or greater. You develop worsening pain at or near the incision site. MAKE SURE YOU:  Understand these instructions. Will watch your condition.  Will get help right away if you are not doing well or get worse. Document Released: 03/22/2007 Document Revised: 02/01/2012 Document Reviewed:  05/23/2007 [facility] Patient Information 2014 [place], [place].         WHAT IS A BLOOD TRANSFUSION? Blood Transfusion Information  A transfusion is the replacement of blood or some of its parts. Blood is made up of multiple cells which provide different functions. Red blood cells carry oxygen and are used for blood loss replacement. White blood cells fight against infection. Platelets control bleeding. Plasma helps clot blood. Other blood products are available for specialized needs, such as hemophilia or other clotting disorders. BEFORE THE TRANSFUSION  Who gives blood for transfusions?  Healthy volunteers who are fully evaluated to make sure their blood is safe. This is blood bank blood. Transfusion therapy is the safest it has ever been in the practice of medicine. Before blood is taken from a donor, a complete history is taken to make sure that person has no history of diseases nor engages in risky social behavior (examples are intravenous drug use or sexual activity with multiple partners). The donor's travel history is screened to minimize risk of transmitting infections, such as malaria. The donated blood is tested for signs of infectious diseases, such as HIV and hepatitis. The blood is then tested to be sure it is compatible with you in order to minimize the chance of a transfusion reaction. If you or a relative donates blood, this is often done in anticipation of surgery and is not appropriate for emergency situations. It takes many days to process the donated blood. RISKS AND COMPLICATIONS Although transfusion therapy is very safe and saves many lives, the main dangers of transfusion include:  Getting an infectious disease. Developing a transfusion reaction. This is an allergic reaction to something in the blood you were given. Every precaution is taken to prevent this. The decision to have a blood transfusion has been considered carefully by your caregiver before blood is given.  Blood is not given unless the benefits outweigh the risks. AFTER THE TRANSFUSION Right after receiving a blood transfusion, you will usually feel much better and more energetic. This is especially true if your red blood cells have gotten low (anemic). The transfusion raises the level of the red blood cells which carry oxygen, and this usually causes an energy increase. The nurse administering the transfusion will monitor you carefully for complications. HOME CARE INSTRUCTIONS  No special instructions are needed after a transfusion. You may find your energy is better. Speak with your caregiver about any limitations on activity for underlying diseases you may have. SEEK MEDICAL CARE IF:  Your condition is not improving after your transfusion. You develop redness or irritation at the intravenous (IV) site. SEEK IMMEDIATE MEDICAL CARE IF:  Any of the following symptoms occur over the next 12 hours: Shaking chills. You have a temperature by mouth above 102 F (38.9 C), not controlled by medicine. Chest, back, or muscle pain. People around you feel you are not acting correctly or are confused. Shortness of breath or difficulty breathing. Dizziness and fainting. You get a rash or develop hives. You have a decrease in urine output. Your urine turns a dark color or changes to pink, red, or brown. Any of the following symptoms occur over the next 10 days: You have a temperature by mouth above 102 F (38.9 C), not controlled by medicine. Shortness of breath. Weakness after normal activity. The white part of the eye turns yellow (jaundice).  You have a decrease in the amount of urine or are urinating less often. Your urine turns a dark color or changes to pink, red, or brown. Document Released: 11/06/2000 Document Revised: 02/01/2012 Document Reviewed: 06/25/2008 [facility] Patient Information 2014 [place],  [place].  _______________________________________________________________________        If you would like to see a video about joint replacement:   [email or web address]

## 2024-07-04 NOTE — Progress Notes (Addendum)
 COVID Vaccine received:  []  No [x]  Yes Date of any COVID positive Test in last 90 days: None  PCP - Lequita Flor, MD clearance scanned to Media Cardiologist - Shelda Bruckner, MD , Aline Door, PA-C Cardiac clearance in 05-18-24 Epic note Nephrology- Mateo Romney, MD at [facility]  Clearance scanned to Media  Chest x-ray - 03-01-2024 EKG - 05-18-2024  Epic Stress Test -08-22-2020  Epic  ECHO - 05-23-2024 Epic Cardiac Cath -  CT Coronary Calcium  score:   Pacemaker / ICD device [x]  No []  Yes   Spinal Cord Stimulator:[x]  No []  Yes       History of Sleep Apnea? []  No [x]  Yes   CPAP used?- [x]  No []  Yes    Patient has: []  NO Hx DM   [x]  Pre-DM   []  DM1  []   DM2 Does the patient monitor blood sugar?   []  N/A   []  No []  Yes  Last A1c was:  5.7 on 04-24-24      Blood Thinner / Instructions: none Aspirin  Instructions:  none  ERAS Protocol Ordered: []  No  [x]  Yes PRE-SURGERY []  ENSURE  [x]  G2  Patient is to be NPO after: 0645  Dental hx: []  Dentures:  [x]  N/A      []  Bridge or Partial:                   []  Loose or Damaged teeth:   Comments: Patient was given the 5 CHG shower / bath instructions for THA surgery along with 2 bottles of the CHG soap. Patient will start this on:   Monday 07-10-24           Activity level: Able to walk up 2 flights of stairs without becoming significantly short of breath or having chest pain?  [x]  No SOB and leg pain  []    Yes  Patient can perform ADLs without assistance. []  No   [x]   Yes  Anesthesia review: HTN, CKD4 d/t Left nephrectomy in 2023, anemia, Pre-DM no meds,  GERD,   OSA- no BiPAP use now  Patient denies any S&S of respiratory illness or Covid - no shortness of breath, fever, cough or chest pain at PAT appointment.  Patient verbalized understanding and agreement to the Pre-Surgical Instructions that were given to them at this PAT appointment. Patient was also educated of the need to review these PAT instructions again prior to  his surgery.I reviewed the appropriate phone numbers to call if they have any and questions or concerns.

## 2024-07-05 ENCOUNTER — Other Ambulatory Visit: Payer: Self-pay

## 2024-07-05 ENCOUNTER — Encounter (HOSPITAL_COMMUNITY)
Admission: RE | Admit: 2024-07-05 | Discharge: 2024-07-05 | Disposition: A | Discharge status: Home / Self Care | Source: Ambulatory Visit | Attending: Orthopaedic Surgery | Admitting: Orthopaedic Surgery

## 2024-07-05 ENCOUNTER — Encounter (HOSPITAL_COMMUNITY): Payer: Self-pay

## 2024-07-05 VITALS — BP 120/58 | HR 74 | Temp 97.5°F | Resp 16 | Ht 68.0 in | Wt 157.0 lb

## 2024-07-05 DIAGNOSIS — N184 Chronic kidney disease, stage 4 (severe): Secondary | ICD-10-CM | POA: Insufficient documentation

## 2024-07-05 DIAGNOSIS — M1611 Unilateral primary osteoarthritis, right hip: Secondary | ICD-10-CM | POA: Diagnosis not present

## 2024-07-05 DIAGNOSIS — I129 Hypertensive chronic kidney disease with stage 1 through stage 4 chronic kidney disease, or unspecified chronic kidney disease: Secondary | ICD-10-CM | POA: Insufficient documentation

## 2024-07-05 DIAGNOSIS — I351 Nonrheumatic aortic (valve) insufficiency: Secondary | ICD-10-CM | POA: Diagnosis not present

## 2024-07-05 DIAGNOSIS — R7303 Prediabetes: Secondary | ICD-10-CM | POA: Insufficient documentation

## 2024-07-05 DIAGNOSIS — Z01812 Encounter for preprocedural laboratory examination: Secondary | ICD-10-CM | POA: Diagnosis not present

## 2024-07-05 DIAGNOSIS — Z01818 Encounter for other preprocedural examination: Secondary | ICD-10-CM

## 2024-07-05 DIAGNOSIS — G4733 Obstructive sleep apnea (adult) (pediatric): Secondary | ICD-10-CM | POA: Diagnosis not present

## 2024-07-05 DIAGNOSIS — Z8551 Personal history of malignant neoplasm of bladder: Secondary | ICD-10-CM | POA: Diagnosis not present

## 2024-07-05 LAB — CBC
HCT: 35 % — ABNORMAL LOW (ref 39.0–52.0)
Hemoglobin: 11.1 g/dL — ABNORMAL LOW (ref 13.0–17.0)
MCH: 28.7 pg (ref 26.0–34.0)
MCHC: 31.7 g/dL (ref 30.0–36.0)
MCV: 90.4 fL (ref 80.0–100.0)
Platelets: 362 K/uL (ref 150–400)
RBC: 3.87 MIL/uL — ABNORMAL LOW (ref 4.22–5.81)
RDW: 16 % — ABNORMAL HIGH (ref 11.5–15.5)
WBC: 8 K/uL (ref 4.0–10.5)
nRBC: 0 % (ref 0.0–0.2)

## 2024-07-05 LAB — BASIC METABOLIC PANEL WITH GFR
Anion gap: 13 (ref 5–15)
BUN: 75 mg/dL — ABNORMAL HIGH (ref 8–23)
CO2: 20 mmol/L — ABNORMAL LOW (ref 22–32)
Calcium: 9.8 mg/dL (ref 8.9–10.3)
Chloride: 104 mmol/L (ref 98–111)
Creatinine, Ser: 4.47 mg/dL — ABNORMAL HIGH (ref 0.61–1.24)
GFR, Estimated: 12 mL/min — ABNORMAL LOW (ref >60)
Glucose, Bld: 113 mg/dL — ABNORMAL HIGH (ref 70–99)
Potassium: 4.6 mmol/L (ref 3.5–5.1)
Sodium: 137 mmol/L (ref 135–145)

## 2024-07-05 LAB — SURGICAL PCR SCREEN
MRSA, PCR: NEGATIVE
Staphylococcus aureus: NEGATIVE

## 2024-07-07 NOTE — Anesthesia Preprocedure Evaluation (Signed)
 Anesthesia Evaluation  Patient identified by MRN, date of birth, ID band Patient awake    Reviewed: Allergy & Precautions, NPO status , Patient's Chart, lab work & pertinent test results  History of Anesthesia Complications Negative for: history of anesthetic complications  Airway Mallampati: III  TM Distance: >3 FB Neck ROM: Limited    Dental  (+) Dental Advisory Given, Teeth Intact   Pulmonary neg shortness of breath, asthma , sleep apnea and Continuous Positive Airway Pressure Ventilation , neg recent URI   breath sounds clear to auscultation       Cardiovascular hypertension, Pt. on medications (-) angina (-) Past MI and (-) CHF  Rhythm:Regular   1. Left ventricular ejection fraction, by estimation, is 60 to 65%. The  left ventricle has normal function. The left ventricle has no regional  wall motion abnormalities. Left ventricular diastolic parameters are  consistent with Grade I diastolic  dysfunction (impaired relaxation).   2. Right ventricular systolic function is normal. The right ventricular  size is normal. Tricuspid regurgitation signal is inadequate for assessing  PA pressure.   3. The mitral valve is normal in structure. Trivial mitral valve  regurgitation. No evidence of mitral stenosis.   4. The aortic valve is tricuspid. Aortic valve regurgitation is mild.  Aortic valve sclerosis/calcification is present, without any evidence of  aortic stenosis. Aortic regurgitation PHT measures 841 msec. Aortic valve  area, by VTI measures 2.11 cm.  Aortic valve mean gradient measures 3.0 mmHg. Aortic valve Vmax measures  1.19 m/s.   5. The inferior vena cava is normal in size with greater than 50%  respiratory variability, suggesting right atrial pressure of 3 mmHg.     Neuro/Psych  PSYCHIATRIC DISORDERS Anxiety     negative neurological ROS     GI/Hepatic   Endo/Other  Hypothyroidism    Renal/GU CRFRenal  diseaseLab Results      Component                Value               Date                      NA                       137                 07/05/2024                K                        4.6                 07/05/2024                CO2                      20 (L)              07/05/2024                GLUCOSE                  113 (H)             07/05/2024                BUN  75 (H)              07/05/2024                CREATININE               4.47 (H)            07/05/2024                CALCIUM                   9.8                 07/05/2024                GFRNONAA                 12 (L)              07/05/2024                Musculoskeletal  (+) Arthritis ,    Abdominal   Peds  Hematology  (+) Blood dyscrasia, anemia Lab Results      Component                Value               Date                      WBC                      8.0                 07/05/2024                HGB                      11.1 (L)            07/05/2024                HCT                      35.0 (L)            07/05/2024                MCV                      90.4                07/05/2024                PLT                      362                 07/05/2024              Anesthesia Other Findings   Reproductive/Obstetrics                              Anesthesia Physical Anesthesia Plan  ASA: 4  Anesthesia Plan: General   Post-op Pain Management: Ofirmev  IV (intra-op)*   Induction: Intravenous  PONV Risk Score and Plan: 3 and Ondansetron , Dexamethasone , Propofol  infusion and TIVA  Airway Management Planned: Oral ETT  Additional Equipment: None  Intra-op Plan:   Post-operative Plan: Extubation in OR  Informed Consent: I have reviewed the patients History and Physical, chart, labs and discussed the procedure including the risks, benefits and alternatives for the proposed anesthesia with the patient or authorized representative who  has indicated his/her understanding and acceptance.     Dental advisory given  Plan Discussed with: CRNA  Anesthesia Plan Comments: (See PAT note from 8/13   Discussed kidney function and risk of further decline with procedure/anesthesia. Patient understands risks of transition to dialysis and desires hip replacement for quality of life regardless of the risk to his kidneys. Further evaluation of kidney function DOS deferred and will check BMP in PACU. )         Anesthesia Quick Evaluation

## 2024-07-07 NOTE — Progress Notes (Signed)
 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  Seen by PCP on 04/24/24 for pre op clearance. Per Dr. Verdia: ACS nsQip surgical risk calculator estimates his risk of serious complication at 10.2%, and the risk of any complication at 6.8%. Risk of cardiac complication is 1.1%. All of these risks are above average. Greatest risk is of developing renal failure, which cannot be estimated by this calculator because of preceding stage IV chronic kidney disease.  - - - - I asked him repeatedly about his quality of life, he cannot give me a clear answer as to whether his quality of life is affected to the level of needing the surgery. He is at very high risk for surgical complications.  ====== will need cardiac as well as nephrology clearance for this. Medical clearance scanned in media on 06/14/24  Seen by Cardiology on 05/18/24 for pre op clearance for hip surgery. Does not have any major cardiac issues. He denied chest pain. Reported some mild DOE attributed to deconditioning after his surgery in March and taking Keytruda  for bladder cancer. A DVT study was ordered for some lower leg edema which was negative for DVT. Echo was done on 06/07/24 and showed normal LVEF, grade I DD, mild AI.  He was cleared for surgery:  He is activity is limited by significant weakness but he is still able to  complete at least 5.07 METS of physical activity without any chest pain or significant shortness of breath.  Revised Cardiac Risk Index - 1 (renal function) indicating a 6.0% risk of major cardiac event perioperatively.  Patient follows with Nephrology for his CKD4. Nephrology clearance signed and scanned in media on 7/23 with instructions: GFR is 11 now. Patient wants hip surgery to maintain quality of life. He understands that he may need dialysis soon. Proceed with surgery. Avoid hypotension, NSAIDs, IV contrast if possible. Check labs.   VS: BP (!) 120/58 Comment: right arm sitting  Pulse 74   Temp (!) 36.4 C (Oral)   Resp 16   Ht 5' 8 (1.727 m)   Wt 71.2 kg   SpO2 100%   BMI 23.87 kg/m   PROVIDERS: PCP - Lequita Verdia, MD clearance scanned to Media Cardiologist - Shelda Lonni, MD , Aline Door, PA-C Cardiac clearance in 05-18-24 Epic note Nephrology- Mateo Romney, MD at [facility]  LABS: Labs reviewed: Acceptable for surgery. (all labs ordered are listed, but only abnormal results are displayed)  Labs Reviewed  CBC - Abnormal; Notable for the following components:      Result Value   RBC 3.87 (*)    Hemoglobin 11.1 (*)    HCT 35.0 (*)    RDW 16.0 (*)    All other components within normal limits  BASIC METABOLIC PANEL WITH GFR - Abnormal; Notable for the following components:   CO2 20 (*)    Glucose, Bld 113 (*)    BUN 75 (*)    Creatinine, Ser  4.47 (*)    GFR, Estimated 12 (*)    All other components within normal limits  SURGICAL PCR SCREEN  TYPE AND SCREEN     IMAGES:  Ct Renal 03/01/24:  IMPRESSION: 1. Status post cystoprostatectomy with right abdominal ureterostomy. There is mild right hydronephrosis and hydroureter, to the level of the urostomy. No obstructing stone. 2. Small volume free fluid in the pelvis. Moderate gas within the subcutaneous soft tissues likely related to recent surgery. Small fluid collections in the right  anterior abdominal wall and left lower quadrant, nonspecific in appearance and could be related to recent surgery. 3. Small supraumbilical ventral hernia containing fat and segment of small bowel but no obstruction. 4. Diverticular disease of the sigmoid colon without acute inflammatory process. 5. Aortic atherosclerosis.   EKG 05/18/24:   Date/Time:                  Thursday May 18 2024 08:47:59 EDT Ventricular Rate:         87 PR Interval:                 152 QRS Duration:             84 QT Interval:                 376 QTC Calculation:452 R Axis:                         27   Text Interpretation:      Normal sinus rhythm  No acute ST/ T wave changes No significant change since last tracing Confirmed by Jadine Patient [id number]) on 05/18/2024 9:04:27 AM NSR, rate 76     CV:  Echo 06/07/24:  IMPRESSIONS     1. Left ventricular ejection fraction, by estimation, is 60 to 65%. The  left ventricle has normal function. The left ventricle has no regional  wall motion abnormalities. Left ventricular diastolic parameters are  consistent with Grade I diastolic  dysfunction (impaired relaxation).   2. Right ventricular systolic function is normal. The right ventricular  size is normal. Tricuspid regurgitation signal is inadequate for assessing  PA pressure.   3. The mitral valve is normal in structure. Trivial mitral valve  regurgitation. No evidence of mitral stenosis.   4. The aortic valve is tricuspid. Aortic valve regurgitation is mild.  Aortic valve sclerosis/calcification is present, without any evidence of  aortic stenosis. Aortic regurgitation PHT measures 841 msec. Aortic valve  area, by VTI measures 2.11 cm.  Aortic valve mean gradient measures 3.0 mmHg. Aortic valve Vmax measures  1.19 m/s.   5. The inferior vena cava is normal in size with greater than 50%  respiratory variability, suggesting right atrial pressure of 3 mmHg.   Stress test 08/22/2020:    IMPRESSION: 1. No reversible ischemia or infarction.   2. Normal left ventricular wall motion.   3. Left ventricular ejection fraction 61%   4. Non invasive risk stratification*: Low    Past Medical History:  Diagnosis Date   Anemia    Arthritis    lower back bulging disc, hips, knees, thumbs, shoulder   Asthma    as a pre teen   Bladder cancer (HCC) UROLOGIST-  DR MCKENZIE   S/P TURBT 08-16-2017   Bladder wall hemorrhage 09/28/2023   BPH (benign prostatic hyperplasia)    CKD (chronic kidney disease), stage III (HCC)    STAGE 3  B CKD per lov  10-16-2020 dr mateo [facility]olan [facility] on chart   Dyspnea    Fatigue    MIDDLE OF DAY ON OCCASION   Frequency of urination    GERD (gastroesophageal reflux disease)    on protonix    Hematuria    History of colon polyps    HLD (hyperlipidemia)    HOH (hard of hearing)    slightly hoh right worse than left- no HAs   HTN (hypertension)    Hypothyroidism    Incontinence of urine    weras pads all the time   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   Right Buccal Cheek   Pre-diabetes    Recurrent bladder papillary carcinoma (HCC) hx 2012 and 2014-- urologist- dr sherrilee   s/p  TURBT 08-16-2017  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   Renal mass 09/02/2022   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 10/02/2023   Sleep apnea    uses cpap set on 20 /4   Trigger finger of left hand 07/04/2021   last few months per pt   UTI (urinary tract infection) 08/20/2020   Weakness of extremity    left knee   Wears glasses     Past Surgical History:  Procedure Laterality Date   BCG X 2     AFTER LAST 2 BLADDER TUMOR REMOVALS   CATARACT EXTRACTION, BILATERAL     COLONSCOPY  YRS AGO   CYSTOSCOPY N/A 10/29/2020   Procedure: CYSTOSCOPY;  Surgeon: Elisabeth Valli BIRCH, MD;  Location: [facility];  Service: Urology;  Laterality: N/A;   CYSTOSCOPY N/A 07/08/2021   Procedure: CYSTOSCOPY;   Surgeon: Elisabeth Valli BIRCH, MD;  Location: [facility];  Service: Urology;  Laterality: N/A;   CYSTOSCOPY W/ RETROGRADES Right 07/21/2023   Procedure: CYSTOSCOPY WITH RIGHT RETROGRADE PYELOGRAM;  Surgeon: Alvaro Ricardo KATHEE Mickey., MD;  [facility];  Service: Urology;  Laterality: Right;   CYSTOSCOPY W/ URETERAL STENT PLACEMENT Bilateral 08/16/2017   Procedure: CYSTOSCOPY WITH RETROGRADE PYELOGRAM/URETERAL STENT PLACEMENT;  Surgeon: sherrilee Belvie CROME, MD;  Location: [facility];  Service: Urology;  Laterality: Bilateral;   CYSTOSCOPY W/ URETERAL STENT PLACEMENT Left 09/02/2022   Procedure: CYSTOSCOPY WITH RETROGRADE PYELOGRAM, URETERAL STENT PLACEMENT;  Surgeon: Alvaro Ricardo, MD;  [facility];  Service: Urology;  Laterality: Left;   CYSTOSCOPY WITH BIOPSY N/A 06/25/2020   Procedure: CYSTOSCOPY FULGURATION   BLADDER  BIOPSY;  Surgeon: Elisabeth Valli BIRCH, MD;  Location: [facility];  Service: Urology;  Laterality: N/A;   CYSTOSCOPY WITH FULGERATION N/A 12/08/2019   Procedure: CYSTOSCOPY WITH FULGERATION;  Surgeon: sherrilee Belvie CROME, MD;  Location: [facility];  Service: Urology;  Laterality: N/A;   CYSTOSCOPY WITH FULGERATION N/A 03/24/2022   Procedure: CYSTOSCOPY WITH FULGERATION;  Surgeon: Elisabeth Valli BIRCH, MD;  [facility];  Service: Urology;  Laterality: N/A;   CYSTOSCOPY WITH RETROGRADE PYELOGRAM, URETEROSCOPY AND STENT PLACEMENT Left 03/24/2022   Procedure: CYSTOSCOPY WITH RETROGRADE PYELOGRAM, DIAGNOSTIC URETEROSCOPY WITH BIOPSY AND STENT PLACEMENT;  Surgeon: Elisabeth Valli BIRCH, MD;  [facility];  Service: Urology;  Laterality: Left;  90 MINUTES   CYSTOSCOPY WITH RETROGRADE PYELOGRAM, URETEROSCOPY AND STENT PLACEMENT Left 04/07/2022   Procedure: CYSTOSCOPY WITH  RETROGRADE PYELOGRAM, DIAGNOSTIC URETEROSCOPY WITH BIOPSY AND STENT EXCHANGE;  Surgeon: Elisabeth Valli BIRCH, MD;  [facility];  Service: Urology;  Laterality:  Left;  75 MINUTES   CYSTOSCOPY WITH RETROGRADE PYELOGRAM, URETEROSCOPY AND STENT PLACEMENT Left 08/11/2022  Procedure: CYSTOSCOPY WITH RETROGRADE PYELOGRAM, ANTEGRADE NEPHROSTOGRAM, DIAGNOSTIC  URETEROSCOPY;  Surgeon: Elisabeth Valli BIRCH, MD;  [facility];  Service: Urology;  Laterality: Left;  90 MINS   HOLMIUM LASER APPLICATION Left 08/11/2022   Procedure: HOLMIUM LASER APPLICATION;  Surgeon: Elisabeth Valli BIRCH, MD;  [facility];  Service: Urology;  Laterality: Left;   IR NEPHROSTOMY EXCHANGE LEFT  05/15/2022   IR NEPHROSTOMY EXCHANGE LEFT  07/10/2022   IR NEPHROSTOMY PLACEMENT LEFT  05/06/2022   KNEE ARTHROSCOPY Left 06/10/2011   LYMPH NODE DISSECTION Left 09/02/2022   Procedure: RETROPERITONEAL LYMPH NODE DISSECTION;  Surgeon: Alvaro Hummer, MD;  [facility];  Service: Urology;  Laterality: Left;   PROSTATE BIOPSY  2000   ROBOT ASSITED LAPAROSCOPIC NEPHROURETERECTOMY Left 09/02/2022   Procedure: XI ROBOT ASSITED LAPAROSCOPIC NEPHROURETERECTOMY;  Surgeon: Alvaro Hummer, MD;  [facility];  Service: Urology;  Laterality: Left;   ROBOT LAP RADICAL CYSTOPROSTATECTOMY PELVIC LYMPHADENECTOMY, NEOBLADDER N/A 02/04/2024   Procedure: ROBOT ASSISTED LAPAROSCOPIC RADICAL CYSTOPROSTATECTOMY;  Surgeon: Alvaro Hummer KATHEE Mickey., MD;  [facility];  Service: Urology;  Laterality: N/A;  300 MINUTES NEEDED FOR CASE   TRANSURETHRAL RESECTION OF BLADDER TUMOR N/A 08/16/2017   Procedure: TRANSURETHRAL RESECTION OF BLADDER TUMOR (TURBT);  Surgeon: Sherrilee Belvie CROME, MD;  Location: [facility];  Service: Urology;  Laterality: N/A;   TRANSURETHRAL RESECTION OF BLADDER TUMOR  12-09-2012   dr lamar sar [facility]   and TURP   TRANSURETHRAL RESECTION OF BLADDER TUMOR N/A 09/20/2017   Procedure: TRANSURETHRAL RESECTION OF BLADDER TUMOR (TURBT);  Surgeon: Sherrilee Belvie CROME, MD;  Location: [facility];  Service: Urology;  Laterality: N/A;   TRANSURETHRAL RESECTION OF  BLADDER TUMOR N/A 12/08/2019   Procedure: TRANSURETHRAL RESECTION OF BLADDER TUMOR;  Surgeon: Sherrilee Belvie CROME, MD;  Location: [facility];  Service: Urology;  Laterality: N/A;  1 HR   TRANSURETHRAL RESECTION OF BLADDER TUMOR N/A 10/29/2020   Procedure: BLADDER FULGERATION;  Surgeon: Elisabeth Valli BIRCH, MD;  Location: [facility];  Service: Urology;  Laterality: N/A;  30 MINS   TRANSURETHRAL RESECTION OF BLADDER TUMOR N/A 07/08/2021   Procedure: TRANSURETHRAL RESECTION OF BLADDER TUMOR (TURBT)/FULGERATION;  Surgeon: Elisabeth Valli BIRCH, MD;  Location: [facility];  Service: Urology;  Laterality: N/A;  45 MINS   TRANSURETHRAL RESECTION OF BLADDER TUMOR N/A 12/02/2021   Procedure: TRANSURETHRAL RESECTION OF BLADDER TUMOR (TURBT);  Surgeon: Elisabeth Valli BIRCH, MD;  [facility];  Service: Urology;  Laterality: N/A;   TRANSURETHRAL RESECTION OF BLADDER TUMOR N/A 07/21/2023   Procedure: TRANSURETHRAL RESECTION OF BLADDER TUMOR (TURBT);  Surgeon: Alvaro Hummer KATHEE Mickey., MD;  [facility];  Service: Urology;  Laterality: N/A;  60 MINUTES   TRANSURETHRAL RESECTION OF BLADDER TUMOR N/A 10/01/2023   Procedure: TRANSURETHRAL RESECTION OF BLADDER TUMOR (TURBT)/CLOT EVACUATION WITH FULGERATION;  Surgeon: Alvaro Hummer KATHEE Mickey., MD;  [facility];  Service: Urology;  Laterality: N/A;   TRANSURETHRAL RESECTION OF PROSTATE  2011   UPPER GI ENDOSCOPY  YRS AGO   URETEROSCOPY Right 02/04/2024   Procedure: RIGHT CUTANEOUS URETEROSCOPY;  Surgeon: Alvaro Hummer KATHEE Mickey., MD;  [facility];  Service: Urology;  Laterality: Right;    MEDICATIONS:  acetaminophen  (TYLENOL ) 500 MG tablet   amLODipine  (NORVASC ) 10 MG tablet   ferrous sulfate  325 (65 FE) MG tablet   finasteride  (PROSCAR ) 5 MG tablet   levothyroxine  (SYNTHROID , LEVOTHROID) 50 MCG tablet   pantoprazole  (PROTONIX ) 20  MG tablet   rosuvastatin  (CRESTOR ) 5 MG tablet   senna-docusate (SENOKOT-S) 8.6-50 MG  tablet   traMADol (ULTRAM) 50 MG tablet   No current facility-administered medications for this encounter.   Burnard CHRISTELLA Odis DEVONNA [facility] Short Stay/Anesthesiology [facility] Phone [phone] 07/07/2024 1:23 PM

## 2024-07-12 ENCOUNTER — Other Ambulatory Visit: Payer: Self-pay

## 2024-07-12 NOTE — Patient Outreach (Signed)
 Complex Care Management   Visit Note  07/12/2024  Name:  [name] [name] MRN: [id number] DOB: 1932/08/17  Situation: Referral received for Complex Care Management related to Hip Pain. I obtained verbal consent from Patient.  Visit completed with Patient  on the phone.  Main concern today is preparing for right hip surgery scheduled 07/14/24, Cardiology and Nephrology clearance has been obtained, patient is looking forward to having the surgery.  States he has DME for post-surgery care, caregivers in place.  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   BPH (benign prostatic hyperplasia)    CKD (chronic kidney disease), stage III (HCC)    STAGE 3  B CKD per lov 10-16-2020 dr mateo [facility]olan [facility] on chart   Dyspnea    Fatigue    MIDDLE OF DAY ON OCCASION   Frequency of urination    GERD (gastroesophageal reflux disease)    on protonix    Hematuria    History of colon polyps    HLD (hyperlipidemia)    HOH (hard of hearing)    slightly hoh right worse than left- no HAs   HTN (hypertension)    Hypothyroidism    Incontinence of urine    weras pads all the time   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   Right Buccal Cheek   Pre-diabetes    Recurrent bladder papillary carcinoma (HCC) hx 2012 and 2014-- urologist- dr sherrilee   s/p  TURBT 08-16-2017  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   Renal mass 09/02/2022   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 10/02/2023   Sleep apnea    uses cpap set on 20 /4   Trigger finger of left hand 07/04/2021   last few months per pt   UTI (urinary tract infection) 08/20/2020   Weakness of extremity    left knee   Wears glasses     Assessment: Patient Reported  Symptoms:  Cognitive Cognitive Status: Alert and oriented to person, place, and time, Normal speech and language skills, Insightful and able to interpret abstract concepts      Neurological Neurological Review of Symptoms: No symptoms reported    HEENT HEENT Symptoms Reported: Not assessed      Cardiovascular Cardiovascular Symptoms Reported: No symptoms reported    Respiratory Respiratory Symptoms Reported: No symptoms reported    Endocrine Endocrine Symptoms Reported: Not assessed    Gastrointestinal Additional Gastrointestinal Details: Uses Senna and Metamucil for constipation.  He is prepared to manage post-hip surgery constipation with medications in the home.      Genitourinary Genitourinary Symptoms Reported: Other Additional Genitourinary Details: Urostomy    Integumentary Integumentary Symptoms Reported: No symptoms reported    Musculoskeletal Musculoskelatal Symptoms Reviewed: Unsteady gait Additional Musculoskeletal Details: Cleared for right hip surgery on 07/14/24        Psychosocial Psychosocial Symptoms Reported: No symptoms reported          07/12/2024    PHQ2-9 Depression Screening   Little interest or pleasure in doing things    Feeling down, depressed, or hopeless    PHQ-2 - Total Score    Trouble falling or staying asleep, or sleeping too much    Feeling tired  or having little energy    Poor appetite or overeating     Feeling bad about yourself - or that you are a failure or have let yourself or your family down    Trouble concentrating on things, such as reading the newspaper or watching television    Moving or speaking so slowly that other people could have noticed.  Or the opposite - being so fidgety or restless that you have been moving around a lot more than usual    Thoughts that you would be better off dead, or hurting yourself in some way    PHQ2-9 Total Score    If you checked off any problems, how difficult have these problems made it for  you to do your work, take care of things at home, or get along with other people    Depression Interventions/Treatment      There were no vitals filed for this visit.  Medications Reviewed Today     Reviewed by Lucian Santana LABOR, RN (Registered Nurse) on 07/12/24 at 1125  Med List Status: <None>   Medication Order Taking? Sig Documenting Provider Last Dose Status Informant  acetaminophen  (TYLENOL ) 500 MG tablet [id number] Yes Take 1,000 mg by mouth every 6 (six) hours as needed for moderate pain (pain score 4-6). [provider]  Active Self  amLODipine  (NORVASC ) 10 MG tablet [id number] Yes Take 1 tablet (10 mg total) by mouth daily. Gonfa, Taye T, MD  Active Self  ferrous sulfate  325 (65 FE) MG tablet [id number] Yes Take 325 mg by mouth daily with supper. [provider]  Active Self  finasteride  (PROSCAR ) 5 MG tablet [id number] Yes Take 5 mg by mouth every evening. [provider]  Active Self  levothyroxine  (SYNTHROID , LEVOTHROID) 50 MCG tablet [id number] Yes Take 50 mcg by mouth daily before breakfast. [provider]  Active Self           Med Note HARLE, ANAIS   Sat Sep 11, 2017  8:33 AM)    pantoprazole  (PROTONIX ) 20 MG tablet [id number] Yes Take 20 mg by mouth at bedtime. [provider]  Active Self           Med Note ALLEGRA, [facility]   Thu Mar 02, 2024  3:26 AM)    rosuvastatin  (CRESTOR ) 5 MG tablet [id number] Yes Take 5 mg by mouth at bedtime. [provider]  Active Self  senna-docusate (SENOKOT-S) 8.6-50 MG tablet [id number] Yes Take 1 tablet by mouth 2 (two) times daily. While taking strong pain meds to prevent constipation Manny, Ricardo KATHEE Raddle., MD  Active Self           Med Note [facility], [facility] I   Thu Mar 02, 2024  3:28 AM)    traMADol (ULTRAM) 50 MG tablet [id number] Yes Take 50 mg by mouth 2 (two) times daily as needed for moderate pain (pain score 4-6). [provider]  Active Self             Recommendation:   Specialty provider follow-up : Ortho surgeon for post-op appt on 07/27/24, Oncology 08/08/24, Cardiology 11/06/24 Continue Current Plan of Care Patient will follow pain management recommendations post hip surgery, will prepare to treat constipation as needed, will report DME needs for home before leaving hospital.   Follow Up Plan:   Telephone follow-up two weeks.  Santana Lucian BSN, CCM [place]  VBCI Population Health RN Care Manager Direct Dial: [phone]  Fax: [phone]

## 2024-07-12 NOTE — Patient Instructions (Signed)
 Visit Information  Thank you for taking time to visit with me today. Please don't hesitate to contact me if I can be of assistance to you before our next scheduled appointment.  Your next care management appointment is by telephone on Wednesday, September 3rd at 1:30pm.   Please call the care guide team at [phone] if you need to cancel, schedule, or reschedule an appointment.   A reminder to ALL patients/family/friends, please call the [place]  National Suicide Prevention Lifeline: 617 218 0606 or TTY: [phone] TTY (938)[phone]) to talk to a trained counselor if you are experiencing a Mental Health or Behavioral Health Crisis or need someone to talk to.  Santana Stamp BSN, CCM [place]  VBCI Population Health RN Care Manager Direct Dial: [phone]  Fax: [phone]

## 2024-07-13 ENCOUNTER — Telehealth: Payer: Self-pay | Admitting: *Deleted

## 2024-07-13 DIAGNOSIS — M1611 Unilateral primary osteoarthritis, right hip: Principal | ICD-10-CM | POA: Insufficient documentation

## 2024-07-13 NOTE — Telephone Encounter (Signed)
 [facility] RNCM pre-op call completed for Right THA to be done on 07/14/24.

## 2024-07-13 NOTE — H&P (Signed)
 TOTAL HIP ADMISSION H&P  Patient is admitted for right total hip arthroplasty.  Subjective:  Chief Complaint: right hip pain  HPI: [name] [name], 89 y.o. male, has a history of pain and functional disability in the right hip(s) due to arthritis and patient has failed non-surgical conservative treatments for greater than 12 weeks to include NSAID's and/or analgesics, corticosteriod injections, flexibility and strengthening excercises, use of assistive devices, weight reduction as appropriate, and activity modification.  Onset of symptoms was gradual starting many years ago with gradually worsening course since that time.The patient noted no past surgery on the right hip(s).  Patient currently rates pain in the right hip at 10 out of 10 with activity. Patient has night pain, worsening of pain with activity and weight bearing, trendelenberg gait, pain that interfers with activities of daily living, and pain with passive range of motion. Patient has evidence of subchondral cysts, subchondral sclerosis, periarticular osteophytes, and joint space narrowing by imaging studies. This condition presents safety issues increasing the risk of falls.  There is no current active infection.  Patient Active Problem List   Diagnosis Date Noted   Unilateral primary osteoarthritis, right hip 07/13/2024   Chronic right hip pain 03/20/2024   Prostatic adenocarcinoma (HCC) 03/03/2024   UTI (urinary tract infection) 03/02/2024   Complicated UTI (urinary tract infection) 03/02/2024   Ureterostomy status (HCC) 02/15/2024   History of iron deficiency 12/20/2023   Dysuria 10/15/2023   AKI (acute kidney injury) (HCC) 10/08/2023   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 10/02/2023   Decreased appetite 09/28/2023   Hardening of the aorta (main artery of the heart) (HCC) 09/28/2023   Mixed hyperlipidemia 09/28/2023   Prediabetes 09/28/2023   Panic attack 09/28/2023   Pedal edema 09/28/2023   Osteoarthritis  09/28/2023   Sinusitis chronic, sphenoidal 09/28/2023   Acute blood loss anemia (ABLA) 09/28/2023   Hypothyroidism 09/28/2023   Low serum vitamin B12 09/10/2023   Anemia of chronic disease 09/10/2023   Normocytic anemia 08/06/2023   Unintentional weight loss 04/09/2021   Leg weakness, bilateral 04/09/2021   Surgical counseling visit 04/09/2021   Essential hypertension 05/23/2020   Sleep apnea treated with nocturnal BiPAP 01/25/2020   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   Hypersomnia with sleep apnea 09/15/2019   Cardiac arrhythmia due to premature depolarization 09/15/2019   Excessive daytime sleepiness 09/15/2019   CKD (chronic kidney disease) stage 4, GFR 15-29 ml/min (HCC) - Baseline Scr 3.0 09/15/2019   Malignant neoplasm of urinary bladder (HCC) 09/15/2019   Gross hematuria 09/11/2017   Benign prostatic hyperplasia without lower urinary tract symptoms 12/09/2012   GERD (gastroesophageal reflux disease) 11/28/2012   Past Medical History:  Diagnosis Date   Anemia    Arthritis    lower back bulging disc, hips, knees, thumbs, shoulder   Asthma    as a pre teen   Bladder cancer (HCC) UROLOGIST-  DR MCKENZIE   S/P TURBT 08-16-2017   Bladder wall hemorrhage 09/28/2023   BPH (benign prostatic hyperplasia)    CKD (chronic kidney disease), stage III (HCC)    STAGE 3  B CKD per lov 10-16-2020 dr mateo d[facility] on chart   Dyspnea    Fatigue    MIDDLE OF DAY ON OCCASION   Frequency of urination    GERD (gastroesophageal reflux disease)    on protonix    Hematuria    History of colon polyps    HLD (hyperlipidemia)    HOH (hard of hearing)  slightly hoh right worse than left- no HAs   HTN (hypertension)    Hypothyroidism    Incontinence of urine    weras pads all the time   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   Right Buccal Cheek   Pre-diabetes    Recurrent bladder papillary carcinoma (HCC) hx 2012 and 2014-- urologist- dr sherrilee    s/p  TURBT 08-16-2017  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   Renal mass 09/02/2022   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 10/02/2023   Sleep apnea    uses cpap set on 20 /4   Trigger finger of left hand 07/04/2021   last few months per pt   UTI (urinary tract infection) 08/20/2020   Weakness of extremity    left knee   Wears glasses     Past Surgical History:  Procedure Laterality Date   BCG X 2     AFTER LAST 2 BLADDER TUMOR REMOVALS   CATARACT EXTRACTION, BILATERAL     COLONSCOPY  YRS AGO   CYSTOSCOPY N/A 10/29/2020   Procedure: CYSTOSCOPY;  Surgeon: Elisabeth Valli BIRCH, MD;  Location: [facility] ;  Service: Urology;  Laterality: N/A;   CYSTOSCOPY N/A 07/08/2021   Procedure: CYSTOSCOPY;  Surgeon: Elisabeth Valli BIRCH, MD;  Location: [facility];  Service: Urology;  Laterality: N/A;   CYSTOSCOPY W/ RETROGRADES Right 07/21/2023   Procedure: CYSTOSCOPY WITH RIGHT RETROGRADE PYELOGRAM;  Surgeon: Alvaro Ricardo KATHEE Mickey., MD;  Location: [facility];  Service: Urology;  Laterality: Right;   CYSTOSCOPY W/ URETERAL STENT PLACEMENT Bilateral 08/16/2017   Procedure: CYSTOSCOPY WITH RETROGRADE PYELOGRAM/URETERAL STENT PLACEMENT;  Surgeon: sherrilee Belvie CROME, MD;  Location: [facility];  Service: Urology;  Laterality: Bilateral;   CYSTOSCOPY W/ URETERAL STENT PLACEMENT Left 09/02/2022   Procedure: CYSTOSCOPY WITH RETROGRADE PYELOGRAM, URETERAL STENT PLACEMENT;  Surgeon: Alvaro Ricardo, MD;  Location: [facility];  Service: Urology;  Laterality: Left;   CYSTOSCOPY WITH BIOPSY N/A 06/25/2020   Procedure: CYSTOSCOPY FULGURATION   BLADDER  BIOPSY;  Surgeon: Elisabeth Valli BIRCH, MD;  Location: [facility];  Service: Urology;  Laterality: N/A;   CYSTOSCOPY WITH FULGERATION N/A 12/08/2019   Procedure: CYSTOSCOPY WITH FULGERATION;  Surgeon: sherrilee Belvie CROME, MD;  Location: [facility];  Service: Urology;   Laterality: N/A;   CYSTOSCOPY WITH FULGERATION N/A 03/24/2022   Procedure: CYSTOSCOPY WITH FULGERATION;  Surgeon: Elisabeth Valli BIRCH, MD;  Location: [facility];  Service: Urology;  Laterality: N/A;   CYSTOSCOPY WITH RETROGRADE PYELOGRAM, URETEROSCOPY AND STENT PLACEMENT Left 03/24/2022   Procedure: CYSTOSCOPY WITH RETROGRADE PYELOGRAM, DIAGNOSTIC URETEROSCOPY WITH BIOPSY AND STENT PLACEMENT;  Surgeon: Elisabeth Valli BIRCH, MD;  Location: [facility];  Service: Urology;  Laterality: Left;  90 MINUTES   CYSTOSCOPY WITH RETROGRADE PYELOGRAM, URETEROSCOPY AND STENT PLACEMENT Left 04/07/2022   Procedure: CYSTOSCOPY WITH  RETROGRADE PYELOGRAM, DIAGNOSTIC URETEROSCOPY WITH BIOPSY AND STENT EXCHANGE;  Surgeon: Elisabeth Valli BIRCH, MD;  Location: [facility];  Service: Urology;  Laterality: Left;  75 MINUTES   CYSTOSCOPY WITH RETROGRADE PYELOGRAM, URETEROSCOPY AND STENT PLACEMENT Left 08/11/2022   Procedure: CYSTOSCOPY WITH RETROGRADE PYELOGRAM, ANTEGRADE NEPHROSTOGRAM, DIAGNOSTIC  URETEROSCOPY;  Surgeon: Elisabeth Valli BIRCH, MD;  Location: [facility];  Service: Urology;  Laterality: Left;  90 MINS   HOLMIUM LASER APPLICATION Left 08/11/2022   Procedure: HOLMIUM LASER APPLICATION;  Surgeon: Elisabeth Valli BIRCH, MD;  Location: [facility];  Service: Urology;  Laterality: Left;   IR NEPHROSTOMY EXCHANGE  LEFT  05/15/2022   IR NEPHROSTOMY EXCHANGE LEFT  07/10/2022   IR NEPHROSTOMY PLACEMENT LEFT  05/06/2022   KNEE ARTHROSCOPY Left 06/10/2011   LYMPH NODE DISSECTION Left 09/02/2022   Procedure: RETROPERITONEAL LYMPH NODE DISSECTION;  Surgeon: Alvaro Hummer, MD;  Location: [facility];  Service: Urology;  Laterality: Left;   PROSTATE BIOPSY  2000   ROBOT ASSITED LAPAROSCOPIC NEPHROURETERECTOMY Left 09/02/2022   Procedure: XI ROBOT ASSITED LAPAROSCOPIC NEPHROURETERECTOMY;  Surgeon: Alvaro Hummer, MD;  Location: [facility];  Service: Urology;  Laterality: Left;   ROBOT LAP RADICAL CYSTOPROSTATECTOMY PELVIC LYMPHADENECTOMY, NEOBLADDER N/A 02/04/2024    Procedure: ROBOT ASSISTED LAPAROSCOPIC RADICAL CYSTOPROSTATECTOMY;  Surgeon: Alvaro Hummer KATHEE Mickey., MD;  Location: [facility];  Service: Urology;  Laterality: N/A;  300 MINUTES NEEDED FOR CASE   TRANSURETHRAL RESECTION OF BLADDER TUMOR N/A 08/16/2017   Procedure: TRANSURETHRAL RESECTION OF BLADDER TUMOR (TURBT);  Surgeon: Sherrilee Belvie CROME, MD;  Location: [facility];  Service: Urology;  Laterality: N/A;   TRANSURETHRAL RESECTION OF BLADDER TUMOR  12-09-2012   dr lamar sar [facility]   and TURP   TRANSURETHRAL RESECTION OF BLADDER TUMOR N/A 09/20/2017   Procedure: TRANSURETHRAL RESECTION OF BLADDER TUMOR (TURBT);  Surgeon: Sherrilee Belvie CROME, MD;  Location: [facility];  Service: Urology;  Laterality: N/A;   TRANSURETHRAL RESECTION OF BLADDER TUMOR N/A 12/08/2019   Procedure: TRANSURETHRAL RESECTION OF BLADDER TUMOR;  Surgeon: Sherrilee Belvie CROME, MD;  Location: [facility];  Service: Urology;  Laterality: N/A;  1 HR   TRANSURETHRAL RESECTION OF BLADDER TUMOR N/A 10/29/2020   Procedure: BLADDER FULGERATION;  Surgeon: Elisabeth Valli BIRCH, MD;  Location: [facility];  Service: Urology;  Laterality: N/A;  30 MINS   TRANSURETHRAL RESECTION OF BLADDER TUMOR N/A 07/08/2021   Procedure: TRANSURETHRAL RESECTION OF BLADDER TUMOR (TURBT)/FULGERATION;  Surgeon: Elisabeth Valli BIRCH, MD;  Location: [facility];  Service: Urology;  Laterality: N/A;  45 MINS   TRANSURETHRAL RESECTION OF BLADDER TUMOR N/A 12/02/2021   Procedure: TRANSURETHRAL RESECTION OF BLADDER TUMOR (TURBT);  Surgeon: Elisabeth Valli BIRCH, MD;  Location: [facility];  Service: Urology;  Laterality: N/A;   TRANSURETHRAL RESECTION OF BLADDER TUMOR N/A 07/21/2023   Procedure: TRANSURETHRAL RESECTION OF BLADDER TUMOR (TURBT);  Surgeon: Alvaro Hummer KATHEE Mickey., MD;  Location: [facility];  Service: Urology;  Laterality: N/A;  60 MINUTES   TRANSURETHRAL RESECTION OF BLADDER TUMOR N/A 10/01/2023    Procedure: TRANSURETHRAL RESECTION OF BLADDER TUMOR (TURBT)/CLOT EVACUATION WITH FULGERATION;  Surgeon: Alvaro Hummer KATHEE Mickey., MD;  Location: [facility];  Service: Urology;  Laterality: N/A;   TRANSURETHRAL RESECTION OF PROSTATE  2011   UPPER GI ENDOSCOPY  YRS AGO   URETEROSCOPY Right 02/04/2024   Procedure: RIGHT CUTANEOUS URETEROSCOPY;  Surgeon: Alvaro Hummer KATHEE Mickey., MD;  Location: [facility];  Service: Urology;  Laterality: Right;    No current facility-administered medications for this encounter.   Current Outpatient Medications  Medication Sig Dispense Refill Last Dose/Taking   acetaminophen  (TYLENOL ) 500 MG tablet Take 1,000 mg by mouth every 6 (six) hours as needed for moderate pain (pain score 4-6).   Taking As Needed   amLODipine  (NORVASC ) 10 MG tablet Take 1 tablet (10 mg total) by mouth daily. 90 tablet 1 Taking   ferrous sulfate  325 (65 FE) MG tablet Take 325 mg by mouth daily with supper.   Taking   finasteride  (PROSCAR ) 5 MG tablet Take 5 mg by mouth every evening.   Taking  levothyroxine  (SYNTHROID , LEVOTHROID) 50 MCG tablet Take 50 mcg by mouth daily before breakfast.   Taking   pantoprazole  (PROTONIX ) 20 MG tablet Take 20 mg by mouth at bedtime.   Taking   rosuvastatin  (CRESTOR ) 5 MG tablet Take 5 mg by mouth at bedtime.   Taking   senna-docusate (SENOKOT-S) 8.6-50 MG tablet Take 1 tablet by mouth 2 (two) times daily. While taking strong pain meds to prevent constipation 10 tablet 0 Taking Differently   traMADol (ULTRAM) 50 MG tablet Take 50 mg by mouth 2 (two) times daily as needed for moderate pain (pain score 4-6).   Taking As Needed   Allergies  Allergen Reactions   Macrobid [Nitrofurantoin] Shortness Of Breath   Ciprofloxacin  Diarrhea and Other (See Comments)    Occurred with 750 mg dose. Has since taken 500 mg doses and has had no reaction/diarrhea.   Keytruda  [Pembrolizumab ] Other (See Comments)    Weakness in both legs and fatigue   Cephalexin Rash and Other (See  Comments)    Rash on arms; tolerated Rocephin /cefpodoxime    [name]  [Mitomycin ] Rash and Other (See Comments)    All over body RASH; possibly triggered kidney REMOVAL and RESTLESS LEGS also    Social History   Tobacco Use   Smoking status: Never   Smokeless tobacco: Never  Substance Use Topics   Alcohol use: Not Currently    Alcohol/week: 1.0 - 2.0 standard drink of alcohol    Types: 1 - 2 Glasses of wine per week    Comment: rare    Family History  Problem Relation Age of Onset   Hypertension Mother    Breast cancer Mother    Other Mother        brain tumor   Alzheimer's disease Mother    Emphysema Father        smoker     Review of Systems  Objective:  Physical Exam Vitals reviewed.  Constitutional:      Appearance: Normal appearance.  HENT:     Head: Normocephalic and atraumatic.  Eyes:     Extraocular Movements: Extraocular movements intact.     Pupils: Pupils are equal, round, and reactive to light.  Cardiovascular:     Rate and Rhythm: Normal rate and regular rhythm.  Pulmonary:     Effort: Pulmonary effort is normal.     Breath sounds: Normal breath sounds.  Abdominal:     Palpations: Abdomen is soft.  Musculoskeletal:     Cervical back: Normal range of motion and neck supple.     Right hip: Tenderness and bony tenderness present. Decreased range of motion. Decreased strength.  Neurological:     Mental Status: He is alert and oriented to person, place, and time.  Psychiatric:        Behavior: Behavior normal.     Vital signs in last 24 hours:    Labs:   Estimated body mass index is 23.87 kg/m as calculated from the following:   Height as of 07/05/24: 5' 8 (1.727 m).   Weight as of 07/05/24: 71.2 kg.   Imaging Review Plain radiographs demonstrate severe degenerative joint disease of the right hip(s). The bone quality appears to be good for age and reported activity level.      Assessment/Plan:  End stage arthritis, right hip(s)  The  patient history, physical examination, clinical judgement of the provider and imaging studies are consistent with end stage degenerative joint disease of the right hip(s) and total hip arthroplasty is deemed medically  necessary. The treatment options including medical management, injection therapy, arthroscopy and arthroplasty were discussed at length. The risks and benefits of total hip arthroplasty were presented and reviewed. The risks due to aseptic loosening, infection, stiffness, dislocation/subluxation,  thromboembolic complications and other imponderables were discussed.  The patient acknowledged the explanation, agreed to proceed with the plan and consent was signed. Patient is being admitted for inpatient treatment for surgery, pain control, PT, OT, prophylactic antibiotics, VTE prophylaxis, progressive ambulation and ADL's and discharge planning.The patient is planning to be discharged home with home health services

## 2024-07-13 NOTE — Care Plan (Signed)
 [facility] RNCM call to patient to discuss his upcoming Right total hip arthroplasty with Dr. Vernetta on 07/14/24 at [facility]. He is agreeable to case management. He lives at home and has his son and DIL that live with him. He will have assistance from family after surgery/discharge home. He already has a walker. No other DME needed. Anticipate HHPT will be needed after a short hospital stay. Referral made to [facility] after choice provided. Reviewed all post op care instructions. Will continue to follow for needs.

## 2024-07-14 ENCOUNTER — Observation Stay (HOSPITAL_COMMUNITY)

## 2024-07-14 ENCOUNTER — Ambulatory Visit (HOSPITAL_BASED_OUTPATIENT_CLINIC_OR_DEPARTMENT_OTHER): Admitting: Registered Nurse

## 2024-07-14 ENCOUNTER — Observation Stay (HOSPITAL_COMMUNITY)
Admission: RE | Admit: 2024-07-14 | Discharge: 2024-07-15 | Disposition: A | Discharge status: Home / Self Care | Attending: Orthopaedic Surgery | Admitting: Orthopaedic Surgery

## 2024-07-14 ENCOUNTER — Other Ambulatory Visit: Payer: Self-pay

## 2024-07-14 ENCOUNTER — Encounter (HOSPITAL_COMMUNITY)
Admission: RE | Disposition: A | Discharge status: Home / Self Care | Payer: Self-pay | Source: Home / Self Care | Attending: Orthopaedic Surgery

## 2024-07-14 ENCOUNTER — Encounter (HOSPITAL_COMMUNITY): Payer: Self-pay | Admitting: Orthopaedic Surgery

## 2024-07-14 ENCOUNTER — Ambulatory Visit (HOSPITAL_COMMUNITY)

## 2024-07-14 ENCOUNTER — Ambulatory Visit (HOSPITAL_COMMUNITY): Payer: Self-pay | Admitting: Medical

## 2024-07-14 DIAGNOSIS — Z96641 Presence of right artificial hip joint: Secondary | ICD-10-CM | POA: Diagnosis not present

## 2024-07-14 DIAGNOSIS — I129 Hypertensive chronic kidney disease with stage 1 through stage 4 chronic kidney disease, or unspecified chronic kidney disease: Secondary | ICD-10-CM | POA: Insufficient documentation

## 2024-07-14 DIAGNOSIS — Z7982 Long term (current) use of aspirin: Secondary | ICD-10-CM | POA: Insufficient documentation

## 2024-07-14 DIAGNOSIS — N1832 Chronic kidney disease, stage 3b: Secondary | ICD-10-CM | POA: Insufficient documentation

## 2024-07-14 DIAGNOSIS — M1611 Unilateral primary osteoarthritis, right hip: Principal | ICD-10-CM | POA: Insufficient documentation

## 2024-07-14 DIAGNOSIS — N184 Chronic kidney disease, stage 4 (severe): Secondary | ICD-10-CM

## 2024-07-14 DIAGNOSIS — E039 Hypothyroidism, unspecified: Secondary | ICD-10-CM | POA: Diagnosis not present

## 2024-07-14 DIAGNOSIS — Z79899 Other long term (current) drug therapy: Secondary | ICD-10-CM | POA: Diagnosis not present

## 2024-07-14 DIAGNOSIS — Z8551 Personal history of malignant neoplasm of bladder: Secondary | ICD-10-CM | POA: Insufficient documentation

## 2024-07-14 DIAGNOSIS — J45909 Unspecified asthma, uncomplicated: Secondary | ICD-10-CM | POA: Diagnosis not present

## 2024-07-14 DIAGNOSIS — Z471 Aftercare following joint replacement surgery: Secondary | ICD-10-CM | POA: Diagnosis not present

## 2024-07-14 DIAGNOSIS — M25551 Pain in right hip: Secondary | ICD-10-CM | POA: Diagnosis present

## 2024-07-14 DIAGNOSIS — Z85828 Personal history of other malignant neoplasm of skin: Secondary | ICD-10-CM | POA: Insufficient documentation

## 2024-07-14 HISTORY — PX: TOTAL HIP ARTHROPLASTY: SHX124

## 2024-07-14 LAB — TYPE AND SCREEN
ABO/RH(D): A POS
Antibody Screen: NEGATIVE

## 2024-07-14 SURGERY — ARTHROPLASTY, HIP, TOTAL, ANTERIOR APPROACH
Anesthesia: General | Site: Hip | Laterality: Right

## 2024-07-14 MED ORDER — DOCUSATE SODIUM 100 MG PO CAPS
100.0000 mg | ORAL_CAPSULE | Freq: Two times a day (BID) | ORAL | Status: DC
  Administered 2024-07-14 – 2024-07-15 (×2): 100 mg via ORAL
  Filled 2024-07-14 (×2): qty 1

## 2024-07-14 MED ORDER — ACETAMINOPHEN 10 MG/ML IV SOLN
INTRAVENOUS | Status: AC
  Filled 2024-07-14: qty 100

## 2024-07-14 MED ORDER — PHENOL 1.4 % MT LIQD: 1.0000 | OROMUCOSAL | Status: DC | PRN

## 2024-07-14 MED ORDER — ONDANSETRON HCL 4 MG/2ML IJ SOLN: 4.0000 mg | Freq: Four times a day (QID) | INTRAMUSCULAR | Status: DC | PRN

## 2024-07-14 MED ORDER — LACTATED RINGERS IV SOLN: INTRAVENOUS | Status: DC | PRN

## 2024-07-14 MED ORDER — AMLODIPINE BESYLATE 10 MG PO TABS
10.0000 mg | ORAL_TABLET | Freq: Every day | ORAL | Status: DC
  Administered 2024-07-15: 10 mg via ORAL
  Filled 2024-07-14 (×2): qty 1

## 2024-07-14 MED ORDER — VANCOMYCIN HCL IN DEXTROSE 1-5 GM/200ML-% IV SOLN
1000.0000 mg | Freq: Once | INTRAVENOUS | Status: AC
  Administered 2024-07-15: 1000 mg via INTRAVENOUS
  Filled 2024-07-14: qty 200

## 2024-07-14 MED ORDER — METHOCARBAMOL 500 MG PO TABS
500.0000 mg | ORAL_TABLET | Freq: Four times a day (QID) | ORAL | Status: DC | PRN
  Administered 2024-07-14 – 2024-07-15 (×2): 500 mg via ORAL
  Filled 2024-07-14 (×2): qty 1

## 2024-07-14 MED ORDER — DEXAMETHASONE SODIUM PHOSPHATE 10 MG/ML IJ SOLN
INTRAMUSCULAR | Status: DC | PRN
  Administered 2024-07-14: 8 mg via INTRAVENOUS

## 2024-07-14 MED ORDER — ALBUMIN HUMAN 5 % IV SOLN
INTRAVENOUS | Status: AC
  Filled 2024-07-14: qty 250

## 2024-07-14 MED ORDER — PANTOPRAZOLE SODIUM 40 MG PO TBEC
40.0000 mg | DELAYED_RELEASE_TABLET | Freq: Every day | ORAL | Status: DC
  Administered 2024-07-14 – 2024-07-15 (×2): 40 mg via ORAL
  Filled 2024-07-14 (×2): qty 1

## 2024-07-14 MED ORDER — PROPOFOL 500 MG/50ML IV EMUL
INTRAVENOUS | Status: DC | PRN
  Administered 2024-07-14: 130 ug/kg/min via INTRAVENOUS

## 2024-07-14 MED ORDER — CEFAZOLIN SODIUM-DEXTROSE 2-4 GM/100ML-% IV SOLN: 2.0000 g | Freq: Four times a day (QID) | INTRAVENOUS | Status: DC

## 2024-07-14 MED ORDER — HYDROCODONE-ACETAMINOPHEN 5-325 MG PO TABS: 1.0000 | ORAL_TABLET | ORAL | Status: DC | PRN

## 2024-07-14 MED ORDER — FENTANYL CITRATE PF 50 MCG/ML IJ SOSY
25.0000 ug | PREFILLED_SYRINGE | INTRAMUSCULAR | Status: DC | PRN
  Administered 2024-07-14 (×4): 50 ug via INTRAVENOUS

## 2024-07-14 MED ORDER — SODIUM CHLORIDE 0.9 % IV SOLN: INTRAVENOUS | Status: DC

## 2024-07-14 MED ORDER — TRANEXAMIC ACID-NACL 1000-0.7 MG/100ML-% IV SOLN
1000.0000 mg | INTRAVENOUS | Status: AC
  Administered 2024-07-14: 1000 mg via INTRAVENOUS
  Filled 2024-07-14: qty 100

## 2024-07-14 MED ORDER — MORPHINE SULFATE (PF) 2 MG/ML IV SOLN: 0.5000 mg | INTRAVENOUS | Status: DC | PRN

## 2024-07-14 MED ORDER — HYDROCODONE-ACETAMINOPHEN 7.5-325 MG PO TABS
1.0000 | ORAL_TABLET | ORAL | Status: DC | PRN
  Administered 2024-07-15 (×2): 1 via ORAL
  Filled 2024-07-14 (×3): qty 1

## 2024-07-14 MED ORDER — HYDROMORPHONE HCL 1 MG/ML IJ SOLN
INTRAMUSCULAR | Status: AC
  Filled 2024-07-14: qty 2

## 2024-07-14 MED ORDER — GENTAMICIN SULFATE 40 MG/ML IJ SOLN
370.0000 mg | INTRAVENOUS | Status: AC
  Administered 2024-07-14: 370 mg via INTRAVENOUS
  Filled 2024-07-14: qty 9.25

## 2024-07-14 MED ORDER — ASPIRIN 81 MG PO CHEW
81.0000 mg | CHEWABLE_TABLET | Freq: Two times a day (BID) | ORAL | Status: DC
  Administered 2024-07-14 – 2024-07-15 (×2): 81 mg via ORAL
  Filled 2024-07-14 (×2): qty 1

## 2024-07-14 MED ORDER — ACETAMINOPHEN 325 MG PO TABS: 325.0000 mg | ORAL_TABLET | Freq: Four times a day (QID) | ORAL | Status: DC | PRN

## 2024-07-14 MED ORDER — LIDOCAINE 2% (20 MG/ML) 5 ML SYRINGE
INTRAMUSCULAR | Status: DC | PRN
  Administered 2024-07-14: 40 mg via INTRAVENOUS

## 2024-07-14 MED ORDER — ROSUVASTATIN CALCIUM 5 MG PO TABS
5.0000 mg | ORAL_TABLET | Freq: Every day | ORAL | Status: DC
  Administered 2024-07-14: 5 mg via ORAL
  Filled 2024-07-14: qty 1

## 2024-07-14 MED ORDER — ONDANSETRON HCL 4 MG/2ML IJ SOLN
INTRAMUSCULAR | Status: DC | PRN
  Administered 2024-07-14: 4 mg via INTRAVENOUS

## 2024-07-14 MED ORDER — ORAL CARE MOUTH RINSE: 15.0000 mL | Freq: Once | OROMUCOSAL | Status: AC

## 2024-07-14 MED ORDER — SUGAMMADEX SODIUM 200 MG/2ML IV SOLN
INTRAVENOUS | Status: AC
  Filled 2024-07-14: qty 2

## 2024-07-14 MED ORDER — HYDROMORPHONE HCL 1 MG/ML IJ SOLN
INTRAMUSCULAR | Status: AC
  Filled 2024-07-14: qty 1

## 2024-07-14 MED ORDER — PHENYLEPHRINE 80 MCG/ML (10ML) SYRINGE FOR IV PUSH (FOR BLOOD PRESSURE SUPPORT)
PREFILLED_SYRINGE | INTRAVENOUS | Status: AC
  Filled 2024-07-14: qty 10

## 2024-07-14 MED ORDER — PROPOFOL 10 MG/ML IV BOLUS
INTRAVENOUS | Status: DC | PRN
  Administered 2024-07-14: 20 mg via INTRAVENOUS
  Administered 2024-07-14: 10 mg via INTRAVENOUS
  Administered 2024-07-14: 70 mg via INTRAVENOUS
  Administered 2024-07-14: 20 mg via INTRAVENOUS

## 2024-07-14 MED ORDER — SODIUM CHLORIDE 0.9 % IR SOLN
Status: DC | PRN
  Administered 2024-07-14: 1000 mL

## 2024-07-14 MED ORDER — STERILE WATER FOR IRRIGATION IR SOLN
Status: DC | PRN
  Administered 2024-07-14: 2000 mL

## 2024-07-14 MED ORDER — CHLORHEXIDINE GLUCONATE 0.12 % MT SOLN
15.0000 mL | Freq: Once | OROMUCOSAL | Status: AC
  Administered 2024-07-14: 15 mL via OROMUCOSAL

## 2024-07-14 MED ORDER — GENTAMICIN SULFATE 40 MG/ML IJ SOLN
5.0000 mg/kg | INTRAVENOUS | Status: DC
  Filled 2024-07-14: qty 9

## 2024-07-14 MED ORDER — SUGAMMADEX SODIUM 200 MG/2ML IV SOLN
INTRAVENOUS | Status: DC | PRN
  Administered 2024-07-14: 200 mg via INTRAVENOUS

## 2024-07-14 MED ORDER — METHOCARBAMOL 1000 MG/10ML IJ SOLN
INTRAMUSCULAR | Status: AC
  Filled 2024-07-14: qty 10

## 2024-07-14 MED ORDER — LIDOCAINE HCL (PF) 2 % IJ SOLN
INTRAMUSCULAR | Status: AC
  Filled 2024-07-14: qty 5

## 2024-07-14 MED ORDER — POVIDONE-IODINE 10 % EX SWAB: 2.0000 | Freq: Once | CUTANEOUS | Status: DC

## 2024-07-14 MED ORDER — PHENYLEPHRINE HCL-NACL 20-0.9 MG/250ML-% IV SOLN
INTRAVENOUS | Status: DC | PRN
  Administered 2024-07-14: 25 ug/min via INTRAVENOUS

## 2024-07-14 MED ORDER — ACETAMINOPHEN 10 MG/ML IV SOLN: 1000.0000 mg | Freq: Once | INTRAVENOUS | Status: DC | PRN

## 2024-07-14 MED ORDER — OXYCODONE HCL 5 MG PO TABS: 5.0000 mg | ORAL_TABLET | Freq: Once | ORAL | Status: DC | PRN

## 2024-07-14 MED ORDER — FENTANYL CITRATE PF 50 MCG/ML IJ SOSY
PREFILLED_SYRINGE | INTRAMUSCULAR | Status: AC
  Filled 2024-07-14: qty 2

## 2024-07-14 MED ORDER — DEXAMETHASONE SODIUM PHOSPHATE 10 MG/ML IJ SOLN
INTRAMUSCULAR | Status: AC
  Filled 2024-07-14: qty 1

## 2024-07-14 MED ORDER — ONDANSETRON HCL 4 MG/2ML IJ SOLN
INTRAMUSCULAR | Status: AC
Start: 2024-07-14 — End: 2024-07-14
  Filled 2024-07-14: qty 2

## 2024-07-14 MED ORDER — HYDROMORPHONE HCL 1 MG/ML IJ SOLN
0.5000 mg | Freq: Once | INTRAMUSCULAR | Status: AC
  Administered 2024-07-14: 0.5 mg via INTRAVENOUS

## 2024-07-14 MED ORDER — ONDANSETRON HCL 4 MG PO TABS: 4.0000 mg | ORAL_TABLET | Freq: Four times a day (QID) | ORAL | Status: DC | PRN

## 2024-07-14 MED ORDER — HYDROMORPHONE HCL 1 MG/ML IJ SOLN
0.2500 mg | INTRAMUSCULAR | Status: DC | PRN
  Administered 2024-07-14 (×4): 0.5 mg via INTRAVENOUS

## 2024-07-14 MED ORDER — FINASTERIDE 5 MG PO TABS
5.0000 mg | ORAL_TABLET | Freq: Every evening | ORAL | Status: DC
  Administered 2024-07-14: 5 mg via ORAL
  Filled 2024-07-14: qty 1

## 2024-07-14 MED ORDER — FENTANYL CITRATE (PF) 100 MCG/2ML IJ SOLN
INTRAMUSCULAR | Status: DC | PRN
  Administered 2024-07-14: 50 ug via INTRAVENOUS
  Administered 2024-07-14 (×2): 25 ug via INTRAVENOUS

## 2024-07-14 MED ORDER — METOCLOPRAMIDE HCL 5 MG/ML IJ SOLN: 5.0000 mg | Freq: Three times a day (TID) | INTRAMUSCULAR | Status: DC | PRN

## 2024-07-14 MED ORDER — PHENYLEPHRINE 80 MCG/ML (10ML) SYRINGE FOR IV PUSH (FOR BLOOD PRESSURE SUPPORT)
PREFILLED_SYRINGE | INTRAVENOUS | Status: DC | PRN
  Administered 2024-07-14: 160 ug via INTRAVENOUS

## 2024-07-14 MED ORDER — PROPOFOL 1000 MG/100ML IV EMUL
INTRAVENOUS | Status: AC
  Filled 2024-07-14: qty 100

## 2024-07-14 MED ORDER — LACTATED RINGERS IV SOLN: INTRAVENOUS | Status: DC

## 2024-07-14 MED ORDER — VANCOMYCIN HCL IN DEXTROSE 1-5 GM/200ML-% IV SOLN
1000.0000 mg | INTRAVENOUS | Status: AC
  Administered 2024-07-14: 1000 mg via INTRAVENOUS
  Filled 2024-07-14: qty 200

## 2024-07-14 MED ORDER — FENTANYL CITRATE (PF) 100 MCG/2ML IJ SOLN
INTRAMUSCULAR | Status: AC
Start: 2024-07-14 — End: 2024-07-14
  Filled 2024-07-14: qty 2

## 2024-07-14 MED ORDER — 0.9 % SODIUM CHLORIDE (POUR BTL) OPTIME
TOPICAL | Status: DC | PRN
  Administered 2024-07-14: 1000 mL

## 2024-07-14 MED ORDER — FENTANYL CITRATE PF 50 MCG/ML IJ SOSY
PREFILLED_SYRINGE | INTRAMUSCULAR | Status: AC
Start: 2024-07-14 — End: 2024-07-14
  Filled 2024-07-14: qty 2

## 2024-07-14 MED ORDER — MENTHOL 3 MG MT LOZG: 1.0000 | LOZENGE | OROMUCOSAL | Status: DC | PRN

## 2024-07-14 MED ORDER — ROCURONIUM BROMIDE 10 MG/ML (PF) SYRINGE
PREFILLED_SYRINGE | INTRAVENOUS | Status: DC | PRN
  Administered 2024-07-14: 70 mg via INTRAVENOUS

## 2024-07-14 MED ORDER — ACETAMINOPHEN 10 MG/ML IV SOLN
INTRAVENOUS | Status: DC | PRN
  Administered 2024-07-14: 1000 mg via INTRAVENOUS

## 2024-07-14 MED ORDER — FERROUS SULFATE 325 (65 FE) MG PO TABS
325.0000 mg | ORAL_TABLET | Freq: Every day | ORAL | Status: DC
  Administered 2024-07-14: 325 mg via ORAL
  Filled 2024-07-14: qty 1

## 2024-07-14 MED ORDER — DIPHENHYDRAMINE HCL 12.5 MG/5ML PO ELIX: 12.5000 mg | ORAL_SOLUTION | ORAL | Status: DC | PRN

## 2024-07-14 MED ORDER — ALBUMIN HUMAN 5 % IV SOLN: INTRAVENOUS | Status: DC | PRN

## 2024-07-14 MED ORDER — LEVOTHYROXINE SODIUM 50 MCG PO TABS
50.0000 ug | ORAL_TABLET | Freq: Every day | ORAL | Status: DC
  Administered 2024-07-15: 50 ug via ORAL
  Filled 2024-07-14: qty 1

## 2024-07-14 MED ORDER — OXYCODONE HCL 5 MG/5ML PO SOLN: 5.0000 mg | Freq: Once | ORAL | Status: DC | PRN

## 2024-07-14 MED ORDER — METOCLOPRAMIDE HCL 5 MG PO TABS: 5.0000 mg | ORAL_TABLET | Freq: Three times a day (TID) | ORAL | Status: DC | PRN

## 2024-07-14 MED ORDER — PROPOFOL 500 MG/50ML IV EMUL
INTRAVENOUS | Status: AC
  Filled 2024-07-14: qty 50

## 2024-07-14 MED ORDER — ALUM & MAG HYDROXIDE-SIMETH 200-200-20 MG/5ML PO SUSP: 30.0000 mL | ORAL | Status: DC | PRN

## 2024-07-14 MED ORDER — ROCURONIUM BROMIDE 10 MG/ML (PF) SYRINGE
PREFILLED_SYRINGE | INTRAVENOUS | Status: AC
  Filled 2024-07-14: qty 10

## 2024-07-14 MED ORDER — METHOCARBAMOL 1000 MG/10ML IJ SOLN
500.0000 mg | Freq: Four times a day (QID) | INTRAMUSCULAR | Status: DC | PRN
  Administered 2024-07-14: 500 mg via INTRAVENOUS

## 2024-07-14 SURGICAL SUPPLY — 37 items
BAG COUNTER SPONGE SURGICOUNT (BAG) ×2 IMPLANT
BAG ZIPLOCK 12X15 (MISCELLANEOUS) ×2 IMPLANT
BENZOIN TINCTURE PRP APPL 2/3 (GAUZE/BANDAGES/DRESSINGS) IMPLANT
BLADE SAW SGTL 18X1.27X75 (BLADE) ×2 IMPLANT
COVER PERINEAL POST (MISCELLANEOUS) ×2 IMPLANT
COVER SURGICAL LIGHT HANDLE (MISCELLANEOUS) ×2 IMPLANT
CUP ACET PNNCL SECTR W/GRIP 56 (Hips) IMPLANT
DRAPE FOOT SWITCH (DRAPES) ×2 IMPLANT
DRAPE STERI IOBAN 125X83 (DRAPES) ×2 IMPLANT
DRAPE U-SHAPE 47X51 STRL (DRAPES) ×4 IMPLANT
DRSG AQUACEL AG ADV 3.5X10 (GAUZE/BANDAGES/DRESSINGS) ×2 IMPLANT
DURAPREP 26ML APPLICATOR (WOUND CARE) ×2 IMPLANT
ELECT PENCIL ROCKER SW 15FT (MISCELLANEOUS) ×2 IMPLANT
ELECT REM PT RETURN 15FT ADLT (MISCELLANEOUS) ×2 IMPLANT
GAUZE XEROFORM 1X8 LF (GAUZE/BANDAGES/DRESSINGS) IMPLANT
GLOVE BIO SURGEON STRL SZ7.5 (GLOVE) ×2 IMPLANT
GLOVE BIOGEL PI IND STRL 8 (GLOVE) ×4 IMPLANT
GLOVE ECLIPSE 8.0 STRL XLNG CF (GLOVE) ×2 IMPLANT
GOWN STRL REUS W/ TWL XL LVL3 (GOWN DISPOSABLE) ×4 IMPLANT
HEAD ARTICULEZE (Hips) IMPLANT
HOLDER FOLEY CATH W/STRAP (MISCELLANEOUS) ×2 IMPLANT
KIT TURNOVER KIT A (KITS) ×2 IMPLANT
PACK ANTERIOR HIP CUSTOM (KITS) ×2 IMPLANT
PINNACLE ALTRX PLUS 4 N 36X56 (Hips) IMPLANT
SET HNDPC FAN SPRY TIP SCT (DISPOSABLE) ×2 IMPLANT
STAPLER SKIN PROX 35W (STAPLE) IMPLANT
STEM FEMORAL SZ 6MM STD ACTIS (Stem) IMPLANT
STRIP CLOSURE SKIN 1/2X4 (GAUZE/BANDAGES/DRESSINGS) IMPLANT
SUT ETHIBOND NAB CT1 #1 30IN (SUTURE) ×2 IMPLANT
SUT ETHILON 2 0 PS N (SUTURE) IMPLANT
SUT MNCRL AB 4-0 PS2 18 (SUTURE) IMPLANT
SUT VIC AB 0 CT1 36 (SUTURE) ×2 IMPLANT
SUT VIC AB 1 CT1 36 (SUTURE) ×2 IMPLANT
SUT VIC AB 2-0 CT1 TAPERPNT 27 (SUTURE) ×4 IMPLANT
TRAY FOLEY MTR SLVR 16FR STAT (SET/KITS/TRAYS/PACK) ×2 IMPLANT
WATER STERILE IRR 1000ML POUR (IV SOLUTION) IMPLANT
YANKAUER SUCT BULB TIP NO VENT (SUCTIONS) ×2 IMPLANT

## 2024-07-14 NOTE — Interval H&P Note (Signed)
 History and Physical Interval Note: The patient understands that he is here today for a right total hip replacement to treat his severe right hip pain and arthritis.  There has been no acute or interval change in his medical status.  The risks and benefits of surgery have been discussed in detail and informed consent has been obtained.  The right operative hip has been marked.  07/14/2024 8:26 AM  [name] [name]  has presented today for surgery, with the diagnosis of endstage osteoarthritis right hip.  The various methods of treatment have been discussed with the patient and family. After consideration of risks, benefits and other options for treatment, the patient has consented to  Procedure(s) with comments: ARTHROPLASTY, HIP, TOTAL, ANTERIOR APPROACH (Right) - Needs RNFA as a surgical intervention.  The patient's history has been reviewed, patient examined, no change in status, stable for surgery.  I have reviewed the patient's chart and labs.  Questions were answered to the patient's satisfaction.     [name]

## 2024-07-14 NOTE — Anesthesia Postprocedure Evaluation (Signed)
 Anesthesia Post Note  Patient: [name] [name]  Procedure(s) Performed: ARTHROPLASTY, HIP, TOTAL, ANTERIOR APPROACH (Right: Hip)     Patient location during evaluation: PACU Anesthesia Type: General Level of consciousness: awake and patient cooperative Pain management: pain level controlled Vital Signs Assessment: post-procedure vital signs reviewed and stable Respiratory status: spontaneous breathing, nonlabored ventilation, respiratory function stable and patient connected to nasal cannula oxygen Cardiovascular status: stable and blood pressure returned to baseline Postop Assessment: no apparent nausea or vomiting Anesthetic complications: no   No notable events documented.                Ileana Chalupa

## 2024-07-14 NOTE — Anesthesia Procedure Notes (Signed)
 Procedure Name: Intubation Date/Time: 07/14/2024 9:48 AM  Performed by: Memory Armida LABOR, CRNAPre-anesthesia Checklist: Patient identified, Emergency Drugs available, Suction available, Patient being monitored and Timeout performed Patient Re-evaluated:Patient Re-evaluated prior to induction Oxygen Delivery Method: Circle system utilized Preoxygenation: Pre-oxygenation with 100% oxygen Induction Type: IV induction Ventilation: Mask ventilation without difficulty Laryngoscope Size: Mac and 4 Grade View: Grade II Tube type: Oral Tube size: 7.5 mm Number of attempts: 1 Airway Equipment and Method: Stylet Placement Confirmation: ETT inserted through vocal cords under direct vision, positive ETCO2 and breath sounds checked- equal and bilateral Secured at: 21 cm Tube secured with: Tape Dental Injury: Teeth and Oropharynx as per pre-operative assessment

## 2024-07-14 NOTE — Transfer of Care (Signed)
 Immediate Anesthesia Transfer of Care Note  Patient: [name] [name]  Procedure(s) Performed: ARTHROPLASTY, HIP, TOTAL, ANTERIOR APPROACH (Right: Hip)  Patient Location: PACU  Anesthesia Type:General  Level of Consciousness: awake, alert , oriented, and patient cooperative  Airway & Oxygen Therapy: Patient Spontanous Breathing and Patient connected to face mask oxygen  Post-op Assessment: Report given to RN, Post -op Vital signs reviewed and stable, and Patient moving all extremities  Post vital signs: Reviewed and stable  Last Vitals:  Vitals Value Taken Time  BP 115/76 07/14/24 11:30  Temp    Pulse 66 07/14/24 11:31  Resp 16 07/14/24 11:31  SpO2 100 % 07/14/24 11:31  Vitals shown include unfiled device data.  Last Pain:  Vitals:   07/14/24 0803  TempSrc: Oral  PainSc:          Complications: No notable events documented.

## 2024-07-14 NOTE — TOC Transition Note (Signed)
 Transition of Care [facility]) - Discharge Note   Patient Details  Name: [name] [name] MRN: [id number] Date of Birth: [birth year]  [facility] CM/SW Contact:  NORMAN ASPEN, LCSW Phone Number: 07/14/2024, 2:32 PM   Clinical Narrative:     Met with pt and son who confirm pt has needed DME in the home.  HHPT prearranged with [facility] via ortho MD office prior to surgery.  No further TOC needs.  Final next level of care: Home w Home Health Services Barriers to Discharge: No Barriers Identified   Patient Goals and CMS Choice Patient states their goals for this hospitalization and ongoing recovery are:: return home          Discharge Placement                       Discharge Plan and Services Additional resources added to the After Visit Summary for                  DME Arranged: N/A DME Agency: NA       HH Arranged: PT HH Agency: [facility]        Social Drivers of Health (SDOH) Interventions SDOH Screenings   Food Insecurity: No Food Insecurity (07/14/2024)  Housing: Low Risk  (07/14/2024)  Transportation Needs: No Transportation Needs (07/14/2024)  Utilities: Not At Risk (07/14/2024)  Depression (PHQ2-9): Low Risk  (04/20/2024)  Financial Resource Strain: Low Risk  (10/12/2023)  Physical Activity: Insufficiently Active (10/12/2023)  Social Connections: Moderately Isolated (07/14/2024)  Tobacco Use: Low Risk  (07/14/2024)  Health Literacy: Adequate Health Literacy (02/09/2024)     Readmission Risk Interventions    02/08/2024   11:10 AM  Readmission Risk Prevention Plan  Transportation Screening Complete  HRI or Home Care Consult Complete  Social Work Consult for Recovery Care Planning/Counseling Complete  Palliative Care Screening Not Applicable  Medication Review Oceanographer) Complete

## 2024-07-14 NOTE — Plan of Care (Signed)
  Problem: Health Behavior/Discharge Planning: Goal: Ability to manage health-related needs will improve Outcome: Progressing   Problem: Clinical Measurements: Goal: Ability to maintain clinical measurements within normal limits will improve Outcome: Progressing   Problem: Activity: Goal: Risk for activity intolerance will decrease Outcome: Progressing   Problem: Skin Integrity: Goal: Risk for impaired skin integrity will decrease Outcome: Progressing   Problem: Activity: Goal: Ability to avoid complications of mobility impairment will improve Outcome: Progressing Goal: Ability to tolerate increased activity will improve Outcome: Progressing   Problem: Clinical Measurements: Goal: Postoperative complications will be avoided or minimized Outcome: Progressing

## 2024-07-14 NOTE — Evaluation (Signed)
 Physical Therapy Evaluation Patient Details Name: [name] [name] MRN: [id number] DOB: Dec 28, 1931 Today's Date: 07/14/2024  History of Present Illness  89 yo male presents to therapy s/p R THA, anterior approach on 07/14/2024 due to failure of conservative measures. Pt PMH includes but is not limited to: chronic pain, prostatic adenocarcinoma, UTI, AKI, s/p L nephrectomy, HLD, B LE edema, hypothyroidism, HTN, OSA, CKD IV, bladder ca s/p lap residual cystectomy, prostatectomy, R ureterostomy 02/04/24, GERD, and HOH.  Clinical Impression   [name] [name] is a 89 y.o. male POD 0 s/p R THA, AA . Patient reports mod I with mobility at baseline. Patient is now limited by functional impairments (see PT problem list below) and requires mod A for bed mobility and mod A  for transfers. Patient was unable to safely ambulate at time of eval. Patient will benefit from continued skilled PT interventions to address impairments and progress towards PLOF. Acute PT will follow to progress mobility and stair training in preparation for safe discharge home with family support and [facility] services.       If plan is discharge home, recommend the following: A lot of help with walking and/or transfers;A lot of help with bathing/dressing/bathroom;Assistance with cooking/housework;Assist for transportation;Help with stairs or ramp for entrance   Can travel by private vehicle        Equipment Recommendations None recommended by PT  Recommendations for Other Services       Functional Status Assessment Patient has had a recent decline in their functional status and demonstrates the ability to make significant improvements in function in a reasonable and predictable amount of time.     Precautions / Restrictions Precautions Precautions: Fall (gait belt high s/p urostomy) Restrictions Weight Bearing Restrictions Per Provider Order: No      Mobility  Bed Mobility Overal bed mobility: Needs Assistance Bed  Mobility: Supine to Sit     Supine to sit: Mod assist, HOB elevated, Used rails     General bed mobility comments: increased time and cues, A for R LE and trunk    Transfers Overall transfer level: Needs assistance Equipment used: Rolling walker (2 wheels) Transfers: Sit to/from Stand, Bed to chair/wheelchair/BSC Sit to Stand: Mod assist Stand pivot transfers: Min assist         General transfer comment: cues and increased time, A for RW management, pt self limiting R LE WB and vocalizing discomfort    Ambulation/Gait               General Gait Details: NT due to lethargy  Stairs            Wheelchair Mobility     Tilt Bed    Modified Rankin (Stroke Patients Only)       Balance Overall balance assessment: Needs assistance Sitting-balance support: Feet supported Sitting balance-Leahy Scale: Fair     Standing balance support: Bilateral upper extremity supported, During functional activity, Reliant on assistive device for balance Standing balance-Leahy Scale: Poor                               Pertinent Vitals/Pain Pain Assessment Pain Assessment: Faces Faces Pain Scale: Hurts even more Pain Location: R LE and hip Pain Descriptors / Indicators: Aching, Constant, Discomfort, Grimacing, Guarding, Moaning, Sore Pain Intervention(s): Limited activity within patient's tolerance, Monitored during session, Premedicated before session, Repositioned, Ice applied    Home Living Family/patient expects to be discharged to:: Private residence  Living Arrangements: Children (pt stated his son and daughter in law have offices in his home) Available Help at Discharge: Family Type of Home: House Home Access: Stairs to enter Entrance Stairs-Rails: Doctor, general practice of Steps: 6 Alternate Level Stairs-Number of Steps: 8 Home Layout: Multi-level;Bed/bath upstairs (split level home, 6 steps to enter, 6 steps to kitchen/living and 8 steps  to B and B) Home Equipment: Agricultural consultant (2 wheels);Cane - single point Additional Comments: pt groggy and lethargic at eval and provided limited insight to PLOF, visitor present and did not provide additional clarification at time of eval    Prior Function Prior Level of Function : Independent/Modified Independent             Mobility Comments: pt reports use of RW or furniture walking in the house, mod I for ADLs and self care tasks       Extremity/Trunk Assessment        Lower Extremity Assessment Lower Extremity Assessment: RLE deficits/detail RLE Deficits / Details: ankle DF/PF 5/5 RLE Sensation: WNL    Cervical / Trunk Assessment Cervical / Trunk Assessment: Normal  Communication   Communication Communication: Impaired Factors Affecting Communication: Difficulty expressing self;Hearing impaired    Cognition Arousal: Lethargic, Suspect due to medications Behavior During Therapy: Flat affect   PT - Cognitive impairments: Attention                         Following commands: Intact (slow one step)       Cueing Cueing Techniques: Verbal cues, Gestural cues, Tactile cues, Visual cues     General Comments      Exercises     Assessment/Plan    PT Assessment Patient needs continued PT services  PT Problem List Decreased strength;Decreased range of motion;Decreased activity tolerance;Decreased balance;Decreased mobility;Decreased coordination;Decreased safety awareness;Pain       PT Treatment Interventions DME instruction;Gait training;Stair training;Functional mobility training;Therapeutic activities;Therapeutic exercise;Balance training;Neuromuscular re-education;Patient/family education;Modalities    PT Goals (Current goals can be found in the Care Plan section)  Acute Rehab PT Goals Patient Stated Goal: to get stronger and do the steps better PT Goal Formulation: With patient Time For Goal Achievement: 08/04/24 Potential to Achieve Goals:  Good    Frequency 7X/week     Co-evaluation               AM-PAC PT 6 Clicks Mobility  Outcome Measure Help needed turning from your back to your side while in a flat bed without using bedrails?: A Little Help needed moving from lying on your back to sitting on the side of a flat bed without using bedrails?: A Lot Help needed moving to and from a bed to a chair (including a wheelchair)?: A Little Help needed standing up from a chair using your arms (e.g., wheelchair or bedside chair)?: A Lot Help needed to walk in hospital room?: Total Help needed climbing 3-5 steps with a railing? : Total 6 Click Score: 12    End of Session Equipment Utilized During Treatment: Gait belt Activity Tolerance: Patient limited by lethargy;Patient limited by pain Patient left: in chair;with call bell/phone within reach;with family/visitor present Nurse Communication: Mobility status PT Visit Diagnosis: Unsteadiness on feet (R26.81);Other abnormalities of gait and mobility (R26.89);Muscle weakness (generalized) (M62.81);Difficulty in walking, not elsewhere classified (R26.2);Pain Pain - Right/Left: Right Pain - part of body: Hip;Leg    Time: 8264-8196 PT Time Calculation (min) (ACUTE ONLY): 28 min   Charges:   PT Evaluation $  PT Eval Low Complexity: 1 Low PT Treatments $Therapeutic Activity: 8-22 mins PT General Charges $$ ACUTE PT VISIT: 1 Visit         [place], PT Acute Rehab   [place] VEAR Drone 07/14/2024, 7:01 PM

## 2024-07-14 NOTE — Op Note (Signed)
 Operative Note  Date of operation: 07/14/2024 Preoperative diagnosis: Right hip primary osteoarthritis  Postoperative diagnosis: Same  Procedure: Right direct anterior total hip arthroplasty  Implants: Implant Name Type Inv. Item Serial No. Manufacturer Lot No. LRB No. Used Action  CUP ACET PNNCL SECTR W/GRIP 56 - [id number] Hips CUP ACET PNNCL SECTR W/GRIP 56  [employer] [id number] Right 1 Implanted  PINNACLE ALTRX PLUS 4 N [id number] - [id number] Hips PINNACLE ALTRX PLUS 4 N [id number]  [employer] [id number] Right 1 Implanted  STEM FEMORAL SZ STD ACTIS - [id number] Stem STEM FEMORAL SZ STD ACTIS  [employer] [id number] Right 1 Implanted  HEAD ARTICULEZE - [id number] Hips HEAD ARTICULEZE  [employer] [id number] Right 1 Implanted   Surgeon: Lonni GRADE. [place], MD Assistant: Jon Hurst, RNFA  Anesthesia: General EBL: 150 cc Antibiotics: IV vancomycin  and IV Levaquin  Complications: None  Indications: The patient is an 89 year old gentleman with debilitating end-stage arthritis involving his right hip.  He has essentially bone-on-bone wear and at this point his pain is severe and daily.  It is detrimentally affecting his mobility, his quality of life and his actives daily living.  He does have significant comorbidities but he does proceed with surgery given the severity of his hip disease.  We did discuss in length in detail the risks of acute blood loss anemia, nerve and vascular injury, fracture, infection, DVT, dislocation, implant failure, leg length differences and wound healing issues.  He understands that our goals are hopefully decreased pain, improved mobility and improve quality of life.  Procedure description: After informed consent was obtained and the appropriate right hip was marked, the patient was brought to the operating room and kept supine on the stretcher where general anesthesia was obtained.  Traction boots were placed on both his feet and next he  was placed supine on the Hana fracture table with a perineal post and placed in both legs in inline skeletal traction devices but no traction applied.  Of note preoperatively he has a significant leg length difference with his right side shorter than the left.  However radiographically it makes it look like he is longer on his right side so we will compensate knowing that he is not longer.  We did assess his right hip and pelvis radiographically.  The right hip was then prepped and draped with DuraPrep and sterile drapes.  A timeout was called and he was identified as the correct patient the correct right hip.  An incision was then made just inferior and posterior to the ASIS and carried slightly obliquely down the leg.  Dissection was carried down to the tensor fascia lata muscle and the tensor fascia was then divided longitudinally to proceed with a direct and traverse the hip.  Circumflex vessels were identified and cauterized.  The hip capsule was identified and opened up in L-type format finding a significant joint effusion.  Cobra retractors were placed around the medial and lateral femoral neck and a femoral neck cut was made with an oscillating saw just proximal to the lesser trochanter and this was completed with an osteotome.  The corkscrew was placed in the femoral head and the femoral head was removed in its entirety and found to have a wide area of completely devoid of cartilage.  A bent Hohmann was then placed over the medial sterile rim and remnants of the acetabular labrum and other debris removed.  Reaming was then initiated from a size 43 reamer and stepwise increments going up to  a size 55 reamer with all reamers placed under direct visualization and the last reamer also placed under direct fluoroscopy in order to obtain the depth reaming, the inclination and anteversion.  The real DePuy sector GRIPTION acetabular component size 56 was placed without difficulty followed by a 36+4 polythene liner.   Attention was then turned to the femur.  With the right leg externally rotated to 120 degrees, extended and adducted, a Mueller retractor was placed medially and a Hohmann retractor by the greater trochanter.  The lateral joint capsule was released and a box cutting osteotome was used to enter the femoral canal.  Broaching was then initiated using the Actis broaching system from a size 0 going to a size 6.  With size 6 in place we trialed a standard offset femoral neck and a 36+1.5 trial hip ball.  This was reduced the pelvis and it was definitely very tight but we felt like we needed more leg length compensate for his previous short leg length.  We dislocated the hip and withdraw components.  We then placed the real Actis femoral component with standard offset size 6 and the real 36+5 metal head ball.  Again this reduced the pelvis and it was very tight but we are pleased with leg length and offset assessing this radiographically and clinically.  Stable on exam.  The soft tissue was then irrigated with normal saline solution.  Joint capsule was closed with interrupted #1 Ethibond suture followed by #1 Vicryl to close the tensor fascia.  0 Vicryl was used to close the deep tissue and 2-0 Vicryl was used to close subcutaneous tissue.  The skin was closed with staples.  An Aquacel dressing was applied.  The patient was taken off of the Hana table, awakened, extubated and taken recovery room.  Jon Hurst, RNFA assisted from beginning to end throughout the case and her assistance was crucial and medically necessary for soft tissue management and retraction, helping guide implant placement and layered closure of the wound.

## 2024-07-14 NOTE — Progress Notes (Signed)
 Patient is yellow mews due to low temperature and blood pressure. Applied warm blankets, rechecked vitals and patient is now green mews. Charge nurse notified.

## 2024-07-15 DIAGNOSIS — Z79899 Other long term (current) drug therapy: Secondary | ICD-10-CM | POA: Diagnosis not present

## 2024-07-15 DIAGNOSIS — Z8551 Personal history of malignant neoplasm of bladder: Secondary | ICD-10-CM | POA: Diagnosis not present

## 2024-07-15 DIAGNOSIS — E039 Hypothyroidism, unspecified: Secondary | ICD-10-CM | POA: Diagnosis not present

## 2024-07-15 DIAGNOSIS — Z7982 Long term (current) use of aspirin: Secondary | ICD-10-CM | POA: Diagnosis not present

## 2024-07-15 DIAGNOSIS — M1611 Unilateral primary osteoarthritis, right hip: Secondary | ICD-10-CM | POA: Diagnosis not present

## 2024-07-15 DIAGNOSIS — Z85828 Personal history of other malignant neoplasm of skin: Secondary | ICD-10-CM | POA: Diagnosis not present

## 2024-07-15 DIAGNOSIS — N1832 Chronic kidney disease, stage 3b: Secondary | ICD-10-CM | POA: Diagnosis not present

## 2024-07-15 DIAGNOSIS — I129 Hypertensive chronic kidney disease with stage 1 through stage 4 chronic kidney disease, or unspecified chronic kidney disease: Secondary | ICD-10-CM | POA: Diagnosis not present

## 2024-07-15 DIAGNOSIS — J45909 Unspecified asthma, uncomplicated: Secondary | ICD-10-CM | POA: Diagnosis not present

## 2024-07-15 LAB — CBC
HCT: 28.4 % — ABNORMAL LOW (ref 39.0–52.0)
Hemoglobin: 9.1 g/dL — ABNORMAL LOW (ref 13.0–17.0)
MCH: 28.8 pg (ref 26.0–34.0)
MCHC: 32 g/dL (ref 30.0–36.0)
MCV: 89.9 fL (ref 80.0–100.0)
Platelets: 283 K/uL (ref 150–400)
RBC: 3.16 MIL/uL — ABNORMAL LOW (ref 4.22–5.81)
RDW: 15.7 % — ABNORMAL HIGH (ref 11.5–15.5)
WBC: 10.1 K/uL (ref 4.0–10.5)
nRBC: 0 % (ref 0.0–0.2)

## 2024-07-15 LAB — BASIC METABOLIC PANEL WITH GFR
Anion gap: 11 (ref 5–15)
BUN: 65 mg/dL — ABNORMAL HIGH (ref 8–23)
CO2: 18 mmol/L — ABNORMAL LOW (ref 22–32)
Calcium: 8.8 mg/dL — ABNORMAL LOW (ref 8.9–10.3)
Chloride: 107 mmol/L (ref 98–111)
Creatinine, Ser: 4.17 mg/dL — ABNORMAL HIGH (ref 0.61–1.24)
GFR, Estimated: 13 mL/min — ABNORMAL LOW (ref >60)
Glucose, Bld: 146 mg/dL — ABNORMAL HIGH (ref 70–99)
Potassium: 4.4 mmol/L (ref 3.5–5.1)
Sodium: 136 mmol/L (ref 135–145)

## 2024-07-15 MED ORDER — HYDROCODONE-ACETAMINOPHEN 5-325 MG PO TABS: 1.0000 | ORAL_TABLET | Freq: Four times a day (QID) | ORAL | 0 refills | Status: DC | PRN

## 2024-07-15 MED ORDER — ASPIRIN 81 MG PO CHEW: 81.0000 mg | CHEWABLE_TABLET | Freq: Two times a day (BID) | ORAL | 0 refills | Status: DC

## 2024-07-15 MED ORDER — METHOCARBAMOL 500 MG PO TABS: 500.0000 mg | ORAL_TABLET | Freq: Four times a day (QID) | ORAL | 0 refills | Status: DC | PRN

## 2024-07-15 NOTE — Discharge Instructions (Signed)
 Per [facility] clinic policy, our goal is ensure optimal postoperative pain control with a multimodal pain management strategy. For all [facility] patients, our goal is to wean post-operative narcotic medications by 6 weeks post-operatively. If this is not possible due to utilization of pain medication prior to surgery, your [facility] doctor will support your acute post-operative pain control for the first 6 weeks postoperatively, with a plan to transition you back to your primary pain team following that. [place] will work to ensure a Therapist, occupational.  INSTRUCTIONS AFTER JOINT REPLACEMENT   Remove items at home which could result in a fall. This includes throw rugs or furniture in walking pathways ICE to the affected joint every three hours while awake for 30 minutes at a time, for at least the first 3-5 days, and then as needed for pain and swelling.  Continue to use ice for pain and swelling. You may notice swelling that will progress down to the foot and ankle.  This is normal after surgery.  Elevate your leg when you are not up walking on it.   Continue to use the breathing machine you got in the hospital (incentive spirometer) which will help keep your temperature down.  It is common for your temperature to cycle up and down following surgery, especially at night when you are not up moving around and exerting yourself.  The breathing machine keeps your lungs expanded and your temperature down.   DIET:  As you were doing prior to hospitalization, we recommend a well-balanced diet.  DRESSING / WOUND CARE / SHOWERING  Keep the surgical dressing until follow up.  The dressing is water  proof, so you can shower without any extra covering.  IF THE DRESSING FALLS OFF or the wound gets wet inside, change the dressing with sterile gauze.  Please use good hand washing techniques before changing the dressing.  Do not use any lotions or creams on the incision until instructed by your surgeon.     ACTIVITY  Increase activity slowly as tolerated, but follow the weight bearing instructions below.   No driving for 6 weeks or until further direction given by your physician.  You cannot drive while taking narcotics.  No lifting or carrying greater than 10 lbs. until further directed by your surgeon. Avoid periods of inactivity such as sitting longer than an hour when not asleep. This helps prevent blood clots.  You may return to work once you are authorized by your doctor.     WEIGHT BEARING   Weight bearing as tolerated with assist device (walker, cane, etc) as directed, use it as long as suggested by your surgeon or therapist, typically at least 4-6 weeks.   EXERCISES  Results after joint replacement surgery are often greatly improved when you follow the exercise, range of motion and muscle strengthening exercises prescribed by your doctor. Safety measures are also important to protect the joint from further injury. Any time any of these exercises cause you to have increased pain or swelling, decrease what you are doing until you are comfortable again and then slowly increase them. If you have problems or questions, call your caregiver or physical therapist for advice.   Rehabilitation is important following a joint replacement. After just a few days of immobilization, the muscles of the leg can become weakened and shrink (atrophy).  These exercises are designed to build up the tone and strength of the thigh and leg muscles and to improve motion. Often times heat used for twenty to thirty minutes before  working out will loosen up your tissues and help with improving the range of motion but do not use heat for the first two weeks following surgery (sometimes heat can increase post-operative swelling).   These exercises can be done on a training (exercise) mat, on the floor, on a table or on a bed. Use whatever works the best and is most comfortable for you.    Use music or television  while you are exercising so that the exercises are a pleasant break in your day. This will make your life better with the exercises acting as a break in your routine that you can look forward to.   Perform all exercises about fifteen times, three times per day or as directed.  You should exercise both the operative leg and the other leg as well.  Exercises include:   Quad Sets - Tighten up the muscle on the front of the thigh (Quad) and hold for 5-10 seconds.   Straight Leg Raises - With your knee straight (if you were given a brace, keep it on), lift the leg to 60 degrees, hold for 3 seconds, and slowly lower the leg.  Perform this exercise against resistance later as your leg gets stronger.  Leg Slides: Lying on your back, slowly slide your foot toward your buttocks, bending your knee up off the floor (only go as far as is comfortable). Then slowly slide your foot back down until your leg is flat on the floor again.  Angel Wings: Lying on your back spread your legs to the side as far apart as you can without causing discomfort.  Hamstring Strength:  Lying on your back, push your heel against the floor with your leg straight by tightening up the muscles of your buttocks.  Repeat, but this time bend your knee to a comfortable angle, and push your heel against the floor.  You may put a pillow under the heel to make it more comfortable if necessary.   A rehabilitation program following joint replacement surgery can speed recovery and prevent re-injury in the future due to weakened muscles. Contact your doctor or a physical therapist for more information on knee rehabilitation.    CONSTIPATION  Constipation is defined medically as fewer than three stools per week and severe constipation as less than one stool per week.  Even if you have a regular bowel pattern at home, your normal regimen is likely to be disrupted due to multiple reasons following surgery.  Combination of anesthesia, postoperative  narcotics, change in appetite and fluid intake all can affect your bowels.   YOU MUST use at least one of the following options; they are listed in order of increasing strength to get the job done.  They are all available over the counter, and you may need to use some, POSSIBLY even all of these options:    Drink plenty of fluids (prune juice may be helpful) and high fiber foods Colace 100 mg by mouth twice a day  Senokot for constipation as directed and as needed Dulcolax (bisacodyl ), take with full glass of water   Miralax  (polyethylene glycol) once or twice a day as needed.  If you have tried all these things and are unable to have a bowel movement in the first 3-4 days after surgery call either your surgeon or your primary doctor.    If you experience loose stools or diarrhea, hold the medications until you stool forms back up.  If your symptoms do not get better within 1 week  or if they get worse, check with your doctor.  If you experience the worst abdominal pain ever or develop nausea or vomiting, please contact the office immediately for further recommendations for treatment.   ITCHING:  If you experience itching with your medications, try taking only a single pain pill, or even half a pain pill at a time.  You can also use Benadryl  over the counter for itching or also to help with sleep.   TED HOSE STOCKINGS:  Use stockings on both legs until for at least 2 weeks or as directed by physician office. They may be removed at night for sleeping.  MEDICATIONS:  See your medication summary on the "After Visit Summary" that nursing will review with you.  You may have some home medications which will be placed on hold until you complete the course of blood thinner medication.  It is important for you to complete the blood thinner medication as prescribed.  PRECAUTIONS:  If you experience chest pain or shortness of breath - call 911 immediately for transfer to the hospital emergency department.    If you develop a fever greater that 101 F, purulent drainage from wound, increased redness or drainage from wound, foul odor from the wound/dressing, or calf pain - CONTACT YOUR SURGEON.                                                   FOLLOW-UP APPOINTMENTS:  If you do not already have a post-op appointment, please call the office for an appointment to be seen by your surgeon.  Guidelines for how soon to be seen are listed in your "After Visit Summary", but are typically between 1-4 weeks after surgery.  OTHER INSTRUCTIONS:   Knee Replacement:  Do not place pillow under knee, focus on keeping the knee straight while resting. CPM instructions: 0-90 degrees, 2 hours in the morning, 2 hours in the afternoon, and 2 hours in the evening. Place foam block, curve side up under heel at all times except when in CPM or when walking.  DO NOT modify, tear, cut, or change the foam block in any way.  POST-OPERATIVE OPIOID TAPER INSTRUCTIONS: It is important to wean off of your opioid medication as soon as possible. If you do not need pain medication after your surgery it is ok to stop day one. Opioids include: Codeine, Hydrocodone(Norco, Vicodin), Oxycodone (Percocet, oxycontin ) and hydromorphone  amongst others.  Long term and even short term use of opiods can cause: Increased pain response Dependence Constipation Depression Respiratory depression And more.  Withdrawal symptoms can include Flu like symptoms Nausea, vomiting And more Techniques to manage these symptoms Hydrate well Eat regular healthy meals Stay active Use relaxation techniques(deep breathing, meditating, yoga) Do Not substitute Alcohol to help with tapering If you have been on opioids for less than two weeks and do not have pain than it is ok to stop all together.  Plan to wean off of opioids This plan should start within one week post op of your joint replacement. Maintain the same interval or time between taking each dose  and first decrease the dose.  Cut the total daily intake of opioids by one tablet each day Next start to increase the time between doses. The last dose that should be eliminated is the evening dose.   MAKE SURE YOU:  Understand these instructions.  Get help right away if you are not doing well or get worse.    Thank you for letting us  be a part of your medical care team.  It is a privilege we respect greatly.  We hope these instructions will help you stay on track for a fast and full recovery!     Dental Antibiotics:  In most cases prophylactic antibiotics for Dental procdeures after total joint surgery are not necessary.  Exceptions are as follows:  1. History of prior total joint infection  2. Severely immunocompromised (Organ Transplant, cancer chemotherapy, Rheumatoid biologic meds such as Humera)  3. Poorly controlled diabetes (A1C &gt; 8.0, blood glucose over 200)  If you have one of these conditions, contact your surgeon for an antibiotic prescription, prior to your dental procedure.

## 2024-07-15 NOTE — Progress Notes (Signed)
 Physical Therapy Treatment Patient Details Name: [name] [name] MRN: [id number] DOB: 11-09-1932 Today's Date: 07/15/2024   History of Present Illness 89 yo male presents to therapy s/p R THA, anterior approach on 07/14/2024 due to failure of conservative measures. Pt PMH includes but is not limited to: chronic pain, prostatic adenocarcinoma, UTI, AKI, s/p L nephrectomy, HLD, B LE edema, hypothyroidism, HTN, OSA, CKD IV, bladder ca s/p lap residual cystectomy, prostatectomy, R ureterostomy 02/04/24, GERD, and HOH.    PT Comments  Pt ambulated in hallway and practiced safe stair technique with 3 family members present and observing.  Pt also provided with HEP and reviewed with family.  Pt plans to have HHPT and family can assist pt initially upon d/c.  Pt feels ready for d/c home today.  Pt and family questions answered within scope of practice.       If plan is discharge home, recommend the following: Assistance with cooking/housework;Assist for transportation;Help with stairs or ramp for entrance;A little help with walking and/or transfers;A little help with bathing/dressing/bathroom   Can travel by private vehicle        Equipment Recommendations  None recommended by PT    Recommendations for Other Services       Precautions / Restrictions Precautions Precautions: Fall Precaution/Restrictions Comments: urostomy     Mobility  Bed Mobility Overal bed mobility: Needs Assistance Bed Mobility: Supine to Sit     Supine to sit: Contact guard     General bed mobility comments: pt in recliner    Transfers Overall transfer level: Needs assistance Equipment used: Rolling walker (2 wheels) Transfers: Sit to/from Stand Sit to Stand: Contact guard assist           General transfer comment: verbal cues for UE and LE positioning for pain control    Ambulation/Gait Ambulation/Gait assistance: Contact guard assist Gait Distance (Feet): 280 Feet Assistive device: Rolling  walker (2 wheels) Gait Pattern/deviations: Step-to pattern, Decreased stance time - right, Antalgic Gait velocity: decr     General Gait Details: verbal cues for sequence, RW positioning, step length, posture   Stairs Stairs: Yes Stairs assistance: Contact guard assist Stair Management: Step to pattern, Forwards, One rail Left Number of Stairs: 4 General stair comments: performed x4, pt performing with incorrect sequence twice despite cues so performed twice more correctly, family present and observed, pt did not require physical assist and family encouraged to use gait belt for safety   Wheelchair Mobility     Tilt Bed    Modified Rankin (Stroke Patients Only)       Balance                                            Communication Communication Communication: Impaired Factors Affecting Communication: Hearing impaired  Cognition Arousal: Alert Behavior During Therapy: WFL for tasks assessed/performed   PT - Cognitive impairments: Memory                         Following commands: Intact      Cueing Cueing Techniques: Verbal cues  Exercises    General Comments        Pertinent Vitals/Pain Pain Assessment Pain Assessment: Faces Faces Pain Scale: Hurts little more Pain Location: R LE and hip Pain Descriptors / Indicators: Spasm Pain Intervention(s): Premedicated before session, Repositioned, Monitored during session  Home Living                          Prior Function            PT Goals (current goals can now be found in the care plan section) Progress towards PT goals: Progressing toward goals    Frequency    7X/week      PT Plan      Co-evaluation              AM-PAC PT 6 Clicks Mobility   Outcome Measure  Help needed turning from your back to your side while in a flat bed without using bedrails?: A Little Help needed moving from lying on your back to sitting on the side of a flat bed  without using bedrails?: A Little Help needed moving to and from a bed to a chair (including a wheelchair)?: A Little Help needed standing up from a chair using your arms (e.g., wheelchair or bedside chair)?: A Little Help needed to walk in hospital room?: A Little Help needed climbing 3-5 steps with a railing? : A Little 6 Click Score: 18    End of Session Equipment Utilized During Treatment: Gait belt Activity Tolerance: Patient tolerated treatment well Patient left: in chair;with call bell/phone within reach;with family/visitor present Nurse Communication: Mobility status PT Visit Diagnosis: Other abnormalities of gait and mobility (R26.89)     Time: 1358-1430 PT Time Calculation (min) (ACUTE ONLY): 32 min  Charges:    $Gait Training: 23-37 mins  PT General Charges $$ ACUTE PT VISIT: 1 Visit                     Tari KLEIN, DPT Physical Therapist Acute Rehabilitation Services Office: [phone]    Tari CROME Payson 07/15/2024, 4:48 PM

## 2024-07-15 NOTE — Care Management Obs Status (Signed)
 MEDICARE OBSERVATION STATUS NOTIFICATION   Patient Details  Name: CHURCHILL GRIMSLEY MRN: [id number] Date of Birth: 16-Dec-1931   Medicare Observation Status Notification Given:  Yes    Demetrion Wesby A [place] W[place]lick, LCSW 07/15/2024, 10:09 AM

## 2024-07-15 NOTE — Progress Notes (Signed)
 Physical Therapy Treatment Patient Details Name: [name] [name] MRN: [id number] DOB: 1932-04-30 Today's Date: 07/15/2024   History of Present Illness 89 yo male presents to therapy s/p R THA, anterior approach on 07/14/2024 due to failure of conservative measures. Pt PMH includes but is not limited to: chronic pain, prostatic adenocarcinoma, UTI, AKI, s/p L nephrectomy, HLD, B LE edema, hypothyroidism, HTN, OSA, CKD IV, bladder ca s/p lap residual cystectomy, prostatectomy, R ureterostomy 02/04/24, GERD, and HOH.    PT Comments  Pt performed exercises and then assisted with ambulating in hallway.  Will return for afternoon session to ambulate again and practice steps in anticipation of possible d/c home today.     If plan is discharge home, recommend the following: Assistance with cooking/housework;Assist for transportation;Help with stairs or ramp for entrance;A little help with walking and/or transfers;A little help with bathing/dressing/bathroom   Can travel by private vehicle        Equipment Recommendations  None recommended by PT    Recommendations for Other Services       Precautions / Restrictions Precautions Precautions: Fall Precaution/Restrictions Comments: urostomy     Mobility  Bed Mobility Overal bed mobility: Needs Assistance Bed Mobility: Supine to Sit     Supine to sit: Contact guard     General bed mobility comments: verbal cues for self assist    Transfers Overall transfer level: Needs assistance Equipment used: Rolling walker (2 wheels) Transfers: Sit to/from Stand Sit to Stand: Contact guard assist           General transfer comment: verbal cues for UE and LE positioning for pain control    Ambulation/Gait Ambulation/Gait assistance: Contact guard assist Gait Distance (Feet): 130 Feet Assistive device: Rolling walker (2 wheels) Gait Pattern/deviations: Step-to pattern, Decreased stance time - right, Antalgic Gait velocity: decr      General Gait Details: verbal cues for sequence, RW positioning, step length, posture   Stairs             Wheelchair Mobility     Tilt Bed    Modified Rankin (Stroke Patients Only)       Balance                                            Communication Communication Communication: Impaired Factors Affecting Communication: Hearing impaired  Cognition Arousal: Alert Behavior During Therapy: WFL for tasks assessed/performed   PT - Cognitive impairments: Memory                         Following commands: Intact      Cueing Cueing Techniques: Verbal cues  Exercises Total Joint Exercises Ankle Circles/Pumps: AROM, Both, 10 reps Quad Sets: AROM, Both, 10 reps Short Arc Quad: AROM, Right, 10 reps Heel Slides: AAROM, Right, 10 reps Hip ABduction/ADduction: AAROM, Right, 10 reps    General Comments        Pertinent Vitals/Pain Pain Assessment Pain Assessment: Faces Faces Pain Scale: Hurts a little bit Pain Descriptors / Indicators: Aching, Sore, Guarding, Grimacing, Spasm Pain Intervention(s): Repositioned, Monitored during session, Premedicated before session, Ice applied    Home Living                          Prior Function            PT  Goals (current goals can now be found in the care plan section) Progress towards PT goals: Progressing toward goals    Frequency    7X/week      PT Plan      Co-evaluation              AM-PAC PT 6 Clicks Mobility   Outcome Measure  Help needed turning from your back to your side while in a flat bed without using bedrails?: A Little Help needed moving from lying on your back to sitting on the side of a flat bed without using bedrails?: A Little Help needed moving to and from a bed to a chair (including a wheelchair)?: A Little Help needed standing up from a chair using your arms (e.g., wheelchair or bedside chair)?: A Little Help needed to walk in hospital  room?: A Little Help needed climbing 3-5 steps with a railing? : A Little 6 Click Score: 18    End of Session Equipment Utilized During Treatment: Gait belt Activity Tolerance: Patient tolerated treatment well Patient left: in chair;with call bell/phone within reach;with chair alarm set Nurse Communication: Mobility status PT Visit Diagnosis: Other abnormalities of gait and mobility (R26.89)     Time: 9067-9040 PT Time Calculation (min) (ACUTE ONLY): 27 min  Charges:    $Gait Training: 8-22 mins $Therapeutic Exercise: 8-22 mins PT General Charges $$ ACUTE PT VISIT: 1 Visit                    Tari KLEIN, DPT Physical Therapist Acute Rehabilitation Services Office: [phone]    Tari CROME P[place] 07/15/2024, 4:45 PM

## 2024-07-15 NOTE — Progress Notes (Signed)
 Subjective: 1 Day Post-Op Procedure(s) (LRB): ARTHROPLASTY, HIP, TOTAL, ANTERIOR APPROACH (Right) Patient reports pain as moderate.    Objective: Vital signs in last 24 hours: Temp:  [96.3 F (35.7 C)-98.5 F (36.9 C)] 97.8 F (36.6 C) (08/23 1034) Pulse Rate:  [65-83] 74 (08/23 1034) Resp:  [12-27] 17 (08/23 1034) BP: (93-145)/(56-95) 117/62 (08/23 1034) SpO2:  [95 %-100 %] 100 % (08/23 1034)  Intake/Output from previous day: 08/22 0701 - 08/23 0700 In: 3992.9 [P.O.:120; I.V.:3313.6; IV Piggyback:559.3] Out: 1525 [Urine:1375; Blood:150] Intake/Output this shift: No intake/output data recorded.  Recent Labs    07/15/24 0339  HGB 9.1*   Recent Labs    07/15/24 0339  WBC 10.1  RBC 3.16*  HCT 28.4*  PLT 283   Recent Labs    07/15/24 0339  NA 136  K 4.4  CL 107  CO2 18*  BUN 65*  CREATININE 4.17*  GLUCOSE 146*  CALCIUM  8.8*   No results for input(s): LABPT, INR in the last 72 hours.  Sensation intact distally Intact pulses distally Dorsiflexion/Plantar flexion intact Incision: dressing C/D/I   Assessment/Plan: 1 Day Post-Op Procedure(s) (LRB): ARTHROPLASTY, HIP, TOTAL, ANTERIOR APPROACH (Right) Up with therapy Discharge home with home health this afternoon.      [name] 07/15/2024, 10:39 AM

## 2024-07-16 DIAGNOSIS — M1990 Unspecified osteoarthritis, unspecified site: Secondary | ICD-10-CM | POA: Diagnosis not present

## 2024-07-16 DIAGNOSIS — E039 Hypothyroidism, unspecified: Secondary | ICD-10-CM | POA: Diagnosis not present

## 2024-07-16 DIAGNOSIS — Z905 Acquired absence of kidney: Secondary | ICD-10-CM | POA: Diagnosis not present

## 2024-07-16 DIAGNOSIS — Z87440 Personal history of urinary (tract) infections: Secondary | ICD-10-CM | POA: Diagnosis not present

## 2024-07-16 DIAGNOSIS — Z9049 Acquired absence of other specified parts of digestive tract: Secondary | ICD-10-CM | POA: Diagnosis not present

## 2024-07-16 DIAGNOSIS — I129 Hypertensive chronic kidney disease with stage 1 through stage 4 chronic kidney disease, or unspecified chronic kidney disease: Secondary | ICD-10-CM | POA: Diagnosis not present

## 2024-07-16 DIAGNOSIS — H9190 Unspecified hearing loss, unspecified ear: Secondary | ICD-10-CM | POA: Diagnosis not present

## 2024-07-16 DIAGNOSIS — Z96641 Presence of right artificial hip joint: Secondary | ICD-10-CM | POA: Diagnosis not present

## 2024-07-16 DIAGNOSIS — G8929 Other chronic pain: Secondary | ICD-10-CM | POA: Diagnosis not present

## 2024-07-16 DIAGNOSIS — Z936 Other artificial openings of urinary tract status: Secondary | ICD-10-CM | POA: Diagnosis not present

## 2024-07-16 DIAGNOSIS — E782 Mixed hyperlipidemia: Secondary | ICD-10-CM | POA: Diagnosis not present

## 2024-07-16 DIAGNOSIS — J45909 Unspecified asthma, uncomplicated: Secondary | ICD-10-CM | POA: Diagnosis not present

## 2024-07-16 DIAGNOSIS — R7303 Prediabetes: Secondary | ICD-10-CM | POA: Diagnosis not present

## 2024-07-16 DIAGNOSIS — Z974 Presence of external hearing-aid: Secondary | ICD-10-CM | POA: Diagnosis not present

## 2024-07-16 DIAGNOSIS — G4730 Sleep apnea, unspecified: Secondary | ICD-10-CM | POA: Diagnosis not present

## 2024-07-16 DIAGNOSIS — D631 Anemia in chronic kidney disease: Secondary | ICD-10-CM | POA: Diagnosis not present

## 2024-07-16 DIAGNOSIS — N184 Chronic kidney disease, stage 4 (severe): Secondary | ICD-10-CM | POA: Diagnosis not present

## 2024-07-16 DIAGNOSIS — Z7982 Long term (current) use of aspirin: Secondary | ICD-10-CM | POA: Diagnosis not present

## 2024-07-16 DIAGNOSIS — K219 Gastro-esophageal reflux disease without esophagitis: Secondary | ICD-10-CM | POA: Diagnosis not present

## 2024-07-16 DIAGNOSIS — Z8551 Personal history of malignant neoplasm of bladder: Secondary | ICD-10-CM | POA: Diagnosis not present

## 2024-07-16 DIAGNOSIS — Z471 Aftercare following joint replacement surgery: Secondary | ICD-10-CM | POA: Diagnosis not present

## 2024-07-16 NOTE — Discharge Summary (Signed)
 Patient ID: [name] [name] MRN: [id number] DOB/AGE: [birth year] 89 y.o.  Admit date: 07/14/2024 Discharge date: 07/15/24  Admission Diagnoses:  Principal Problem:   Unilateral primary osteoarthritis, right hip Active Problems:   Status post total replacement of right hip   Discharge Diagnoses:  Same  Past Medical History:  Diagnosis Date   Anemia    Arthritis    lower back bulging disc, hips, knees, thumbs, shoulder   Asthma    as a pre teen   Bladder cancer (HCC) UROLOGIST-  DR MCKENZIE   S/P TURBT 08-16-2017   Bladder wall hemorrhage 09/28/2023   BPH (benign prostatic hyperplasia)    CKD (chronic kidney disease), stage III (HCC)    STAGE 3  B CKD per lov 10-16-2020 dr mateo dolan [facility] on chart   Dyspnea    Fatigue    MIDDLE OF DAY ON OCCASION   Frequency of urination    GERD (gastroesophageal reflux disease)    on protonix    Hematuria    History of colon polyps    HLD (hyperlipidemia)    HOH (hard of hearing)    slightly hoh right worse than left- no HAs   HTN (hypertension)    Hypothyroidism    Incontinence of urine    weras pads all the time   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   Right Buccal Cheek   Pre-diabetes    Recurrent bladder papillary carcinoma (HCC) hx 2012 and 2014-- urologist- dr sherrilee   s/p  TURBT 08-16-2017  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   Renal mass 09/02/2022   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 10/02/2023   Sleep apnea    uses cpap set on 20 /4   Trigger finger of left hand 07/04/2021   last few months per pt   UTI (urinary tract infection) 08/20/2020   Weakness of extremity    left knee   Wears glasses     Surgeries: Procedure(s): ARTHROPLASTY, HIP, TOTAL, ANTERIOR APPROACH on 07/14/2024   Consultants:   Discharged Condition: Improved  Hospital Course: [name] [name] is an 89 y.o. male who was admitted 07/14/2024 for operative treatment ofUnilateral  primary osteoarthritis, right hip. Patient has severe unremitting pain that affects sleep, daily activities, and work/hobbies. After pre-op clearance the patient was taken to the operating room on 07/14/2024 and underwent  Procedure(s): ARTHROPLASTY, HIP, TOTAL, ANTERIOR APPROACH.    Patient was given perioperative antibiotics:  Anti-infectives (From admission, onward)    Start     Dose/Rate Route Frequency Ordered Stop   07/15/24 0830  vancomycin  (VANCOCIN ) IVPB 1000 mg/200 mL premix        1,000 mg 200 mL/hr over 60 Minutes Intravenous  Once 07/14/24 1358 07/15/24 1105   07/14/24 1445  ceFAZolin  (ANCEF ) IVPB 2g/100 mL premix  Status:  Discontinued        2 g 200 mL/hr over 30 Minutes Intravenous Every 6 hours 07/14/24 1348 07/14/24 1358   07/14/24 0815  gentamicin  (GARAMYCIN ) 370 mg in dextrose  5 % 100 mL IVPB        370 mg 109.3 mL/hr over 60 Minutes Intravenous On call to O.R. 07/14/24 0802 07/15/24 1105   07/14/24 0800  gentamicin  (GARAMYCIN ) 360 mg in dextrose  5 % 100 mL IVPB  Status:  Discontinued        5 mg/kg  71.2 kg 109 mL/hr over 60 Minutes Intravenous On call to O.R. 07/14/24 0754 07/14/24 0802   07/14/24 0800  vancomycin  (  VANCOCIN ) IVPB 1000 mg/200 mL premix        1,000 mg 200 mL/hr over 60 Minutes Intravenous On call to O.R. 07/14/24 0754 07/15/24 1105        Patient was given sequential compression devices, early ambulation, and chemoprophylaxis to prevent DVT.  Inpatient Morphine  Milligram Equivalents Per Day 8/22 - 8/23   Values displayed are in units of MME/Day    Order Start / End Date 8/22 Yesterday    oxyCODONE  (Oxy IR/ROXICODONE ) immediate release tablet 5 mg 8/22 - 8/22 0 of Unknown --    oxyCODONE  (ROXICODONE ) 5 MG/5ML solution 5 mg 8/22 - 8/22 0 of Unknown --      Group total: 0 of Unknown     fentaNYL  (SUBLIMAZE ) injection 8/22 - 8/22 *30 of 30 --    HYDROcodone -acetaminophen  (NORCO/VICODIN) 5-325 MG per tablet 1-2 tablet 8/22 - 8/23 0 of 15-30 0 of  25-50    HYDROcodone -acetaminophen  (NORCO) 7.5-325 MG per tablet 1-2 tablet 8/22 - 8/23 0 of 22.5-45 15 of 37.5-75    morphine  (PF) 2 MG/ML injection 0.5-1 mg 8/22 - 8/23 0 of 9-18 0 of 15-30    fentaNYL  (SUBLIMAZE ) injection 25-50 mcg 8/22 - 8/22 60 of 45-90 --    HYDROmorphone  (DILAUDID ) injection 0.25-0.5 mg 8/22 - 8/22 40 of 40-80 --    HYDROmorphone  (DILAUDID ) injection 0.5 mg 8/22 - 8/22 10 of 10 --    Daily Totals  * 140 of Unknown (at least 171.5-303) 15 of 77.5-155  *One-Step medication  Calculation Errors     Order Type Date Details   oxyCODONE  (Oxy IR/ROXICODONE ) immediate release tablet 5 mg Ordered Dose -- Insufficient frequency information   oxyCODONE  (ROXICODONE ) 5 MG/5ML solution 5 mg Ordered Dose -- Insufficient frequency information            Patient benefited maximally from hospital stay and there were no complications.    Recent vital signs: No data found.   Recent laboratory studies:  Recent Labs    07/15/24 0339  WBC 10.1  HGB 9.1*  HCT 28.4*  PLT 283  NA 136  K 4.4  CL 107  CO2 18*  BUN 65*  CREATININE 4.17*  GLUCOSE 146*  CALCIUM  8.8*     Discharge Medications:   Allergies as of 07/15/2024       Reactions   Macrobid [nitrofurantoin] Shortness Of Breath   Ciprofloxacin  Diarrhea, Other (See Comments)   Occurred with 750 mg dose. Has since taken 500 mg doses and has had no reaction/diarrhea.   Keytruda  [pembrolizumab ] Other (See Comments)   Weakness in both legs and fatigue   Cephalexin Rash, Other (See Comments)   Rash on arms; tolerated Rocephin /cefpodoxime    [name]  [mitomycin ] Rash, Other (See Comments)   All over body RASH; possibly triggered kidney REMOVAL and RESTLESS LEGS also        Medication List     TAKE these medications    acetaminophen  500 MG tablet Commonly known as: TYLENOL  Take 1,000 mg by mouth every 6 (six) hours as needed for moderate pain (pain score 4-6).   amLODipine  10 MG tablet Commonly known as:  NORVASC  Take 1 tablet (10 mg total) by mouth daily.   aspirin  81 MG chewable tablet Chew 1 tablet (81 mg total) by mouth 2 (two) times daily.   ferrous sulfate  325 (65 FE) MG tablet Take 325 mg by mouth daily with supper.   finasteride  5 MG tablet Commonly known as: PROSCAR  Take 5 mg by mouth  every evening.   HYDROcodone -acetaminophen  5-325 MG tablet Commonly known as: NORCO/VICODIN Take 1-2 tablets by mouth every 6 (six) hours as needed for moderate pain (pain score 4-6).   levothyroxine  50 MCG tablet Commonly known as: SYNTHROID  Take 50 mcg by mouth daily before breakfast.   methocarbamol  500 MG tablet Commonly known as: ROBAXIN  Take 1 tablet (500 mg total) by mouth every 6 (six) hours as needed for muscle spasms.   pantoprazole  20 MG tablet Commonly known as: PROTONIX  Take 20 mg by mouth at bedtime.   rosuvastatin  5 MG tablet Commonly known as: CRESTOR  Take 5 mg by mouth at bedtime.   senna-docusate 8.6-50 MG tablet Commonly known as: Senokot-S Take 1 tablet by mouth 2 (two) times daily. While taking strong pain meds to prevent constipation   traMADol 50 MG tablet Commonly known as: ULTRAM Take 50 mg by mouth 2 (two) times daily as needed for moderate pain (pain score 4-6).        Diagnostic Studies: DG Pelvis Portable Result Date: 07/14/2024 CLINICAL DATA:  Status post right hip arthroplasty. EXAM: PORTABLE PELVIS 1-2 VIEWS COMPARISON:  None Available. FINDINGS: Right hip arthroplasty in expected alignment. No periprosthetic lucency or fracture. Recent postsurgical change includes air and edema in the soft tissues. Overlying skin staples in place. IMPRESSION: Right hip arthroplasty without immediate postoperative complication. Electronically Signed   By: Andrea Gasman M.D.   On: 07/14/2024 13:41   DG HIP UNILAT WITH PELVIS 1V RIGHT Result Date: 07/14/2024 CLINICAL DATA:  Elective surgery. EXAM: DG HIP (WITH OR WITHOUT PELVIS) 1V RIGHT COMPARISON:  None  Available. FINDINGS: Three fluoroscopic spot views of the pelvis and right hip obtained in the operating room. Images during hip arthroplasty. Fluoroscopy time 18 seconds. Dose 2.0385 mGy. IMPRESSION: Intraoperative fluoroscopy during right hip arthroplasty. Electronically Signed   By: Andrea Gasman M.D.   On: 07/14/2024 13:40   DG C-Arm 1-60 Min-No Report Result Date: 07/14/2024 Fluoroscopy was utilized by the requesting physician.  No radiographic interpretation.   DG C-Arm 1-60 Min-No Report Result Date: 07/14/2024 Fluoroscopy was utilized by the requesting physician.  No radiographic interpretation.    Disposition: Discharge disposition: 01-Home or Self Care          Follow-up Information     [facility] Follow up.   Specialty: Home Health Services Why: to provide home physical therapy visits Contact information: [address] [place] [address] [phone]         Vernetta Lonni GRADE, MD Follow up in 2 week(s).   Specialty: Orthopedic Surgery Contact information: [address] [place] [address] [phone]                  Signed: Lonni GRADE Vernetta 07/16/2024, 4:21 PM

## 2024-07-17 ENCOUNTER — Encounter (HOSPITAL_COMMUNITY): Payer: Self-pay | Admitting: Orthopaedic Surgery

## 2024-07-19 DIAGNOSIS — D631 Anemia in chronic kidney disease: Secondary | ICD-10-CM | POA: Diagnosis not present

## 2024-07-19 DIAGNOSIS — Z974 Presence of external hearing-aid: Secondary | ICD-10-CM | POA: Diagnosis not present

## 2024-07-19 DIAGNOSIS — K219 Gastro-esophageal reflux disease without esophagitis: Secondary | ICD-10-CM | POA: Diagnosis not present

## 2024-07-19 DIAGNOSIS — N184 Chronic kidney disease, stage 4 (severe): Secondary | ICD-10-CM | POA: Diagnosis not present

## 2024-07-19 DIAGNOSIS — M1990 Unspecified osteoarthritis, unspecified site: Secondary | ICD-10-CM | POA: Diagnosis not present

## 2024-07-19 DIAGNOSIS — Z87440 Personal history of urinary (tract) infections: Secondary | ICD-10-CM | POA: Diagnosis not present

## 2024-07-19 DIAGNOSIS — R7303 Prediabetes: Secondary | ICD-10-CM | POA: Diagnosis not present

## 2024-07-19 DIAGNOSIS — Z471 Aftercare following joint replacement surgery: Secondary | ICD-10-CM | POA: Diagnosis not present

## 2024-07-19 DIAGNOSIS — G4730 Sleep apnea, unspecified: Secondary | ICD-10-CM | POA: Diagnosis not present

## 2024-07-19 DIAGNOSIS — Z8551 Personal history of malignant neoplasm of bladder: Secondary | ICD-10-CM | POA: Diagnosis not present

## 2024-07-19 DIAGNOSIS — Z936 Other artificial openings of urinary tract status: Secondary | ICD-10-CM | POA: Diagnosis not present

## 2024-07-19 DIAGNOSIS — Z7982 Long term (current) use of aspirin: Secondary | ICD-10-CM | POA: Diagnosis not present

## 2024-07-19 DIAGNOSIS — Z905 Acquired absence of kidney: Secondary | ICD-10-CM | POA: Diagnosis not present

## 2024-07-19 DIAGNOSIS — H9190 Unspecified hearing loss, unspecified ear: Secondary | ICD-10-CM | POA: Diagnosis not present

## 2024-07-19 DIAGNOSIS — Z9049 Acquired absence of other specified parts of digestive tract: Secondary | ICD-10-CM | POA: Diagnosis not present

## 2024-07-19 DIAGNOSIS — J45909 Unspecified asthma, uncomplicated: Secondary | ICD-10-CM | POA: Diagnosis not present

## 2024-07-19 DIAGNOSIS — I129 Hypertensive chronic kidney disease with stage 1 through stage 4 chronic kidney disease, or unspecified chronic kidney disease: Secondary | ICD-10-CM | POA: Diagnosis not present

## 2024-07-23 ENCOUNTER — Other Ambulatory Visit: Payer: Self-pay | Admitting: Orthopaedic Surgery

## 2024-07-24 DIAGNOSIS — Z87440 Personal history of urinary (tract) infections: Secondary | ICD-10-CM | POA: Diagnosis not present

## 2024-07-24 DIAGNOSIS — E039 Hypothyroidism, unspecified: Secondary | ICD-10-CM | POA: Diagnosis not present

## 2024-07-24 DIAGNOSIS — K219 Gastro-esophageal reflux disease without esophagitis: Secondary | ICD-10-CM | POA: Diagnosis not present

## 2024-07-24 DIAGNOSIS — M1990 Unspecified osteoarthritis, unspecified site: Secondary | ICD-10-CM | POA: Diagnosis not present

## 2024-07-24 DIAGNOSIS — G4730 Sleep apnea, unspecified: Secondary | ICD-10-CM | POA: Diagnosis not present

## 2024-07-24 DIAGNOSIS — Z7982 Long term (current) use of aspirin: Secondary | ICD-10-CM | POA: Diagnosis not present

## 2024-07-24 DIAGNOSIS — Z9049 Acquired absence of other specified parts of digestive tract: Secondary | ICD-10-CM | POA: Diagnosis not present

## 2024-07-24 DIAGNOSIS — Z905 Acquired absence of kidney: Secondary | ICD-10-CM | POA: Diagnosis not present

## 2024-07-24 DIAGNOSIS — J45909 Unspecified asthma, uncomplicated: Secondary | ICD-10-CM | POA: Diagnosis not present

## 2024-07-24 DIAGNOSIS — I129 Hypertensive chronic kidney disease with stage 1 through stage 4 chronic kidney disease, or unspecified chronic kidney disease: Secondary | ICD-10-CM | POA: Diagnosis not present

## 2024-07-24 DIAGNOSIS — E782 Mixed hyperlipidemia: Secondary | ICD-10-CM | POA: Diagnosis not present

## 2024-07-24 DIAGNOSIS — Z8551 Personal history of malignant neoplasm of bladder: Secondary | ICD-10-CM | POA: Diagnosis not present

## 2024-07-24 DIAGNOSIS — H9190 Unspecified hearing loss, unspecified ear: Secondary | ICD-10-CM | POA: Diagnosis not present

## 2024-07-24 DIAGNOSIS — N184 Chronic kidney disease, stage 4 (severe): Secondary | ICD-10-CM | POA: Diagnosis not present

## 2024-07-24 DIAGNOSIS — D631 Anemia in chronic kidney disease: Secondary | ICD-10-CM | POA: Diagnosis not present

## 2024-07-24 DIAGNOSIS — R7303 Prediabetes: Secondary | ICD-10-CM | POA: Diagnosis not present

## 2024-07-24 DIAGNOSIS — Z936 Other artificial openings of urinary tract status: Secondary | ICD-10-CM | POA: Diagnosis not present

## 2024-07-24 DIAGNOSIS — Z96641 Presence of right artificial hip joint: Secondary | ICD-10-CM | POA: Diagnosis not present

## 2024-07-24 DIAGNOSIS — G8929 Other chronic pain: Secondary | ICD-10-CM | POA: Diagnosis not present

## 2024-07-24 DIAGNOSIS — Z471 Aftercare following joint replacement surgery: Secondary | ICD-10-CM | POA: Diagnosis not present

## 2024-07-24 DIAGNOSIS — Z974 Presence of external hearing-aid: Secondary | ICD-10-CM | POA: Diagnosis not present

## 2024-07-25 DIAGNOSIS — C662 Malignant neoplasm of left ureter: Secondary | ICD-10-CM | POA: Diagnosis not present

## 2024-07-25 DIAGNOSIS — I129 Hypertensive chronic kidney disease with stage 1 through stage 4 chronic kidney disease, or unspecified chronic kidney disease: Secondary | ICD-10-CM | POA: Diagnosis not present

## 2024-07-25 DIAGNOSIS — R5383 Other fatigue: Secondary | ICD-10-CM | POA: Diagnosis not present

## 2024-07-25 DIAGNOSIS — E782 Mixed hyperlipidemia: Secondary | ICD-10-CM | POA: Diagnosis not present

## 2024-07-25 DIAGNOSIS — D649 Anemia, unspecified: Secondary | ICD-10-CM | POA: Diagnosis not present

## 2024-07-25 DIAGNOSIS — E039 Hypothyroidism, unspecified: Secondary | ICD-10-CM | POA: Diagnosis not present

## 2024-07-25 DIAGNOSIS — N184 Chronic kidney disease, stage 4 (severe): Secondary | ICD-10-CM | POA: Diagnosis not present

## 2024-07-25 DIAGNOSIS — C678 Malignant neoplasm of overlapping sites of bladder: Secondary | ICD-10-CM | POA: Diagnosis not present

## 2024-07-25 DIAGNOSIS — I10 Essential (primary) hypertension: Secondary | ICD-10-CM | POA: Diagnosis not present

## 2024-07-25 DIAGNOSIS — I700 Atherosclerosis of aorta: Secondary | ICD-10-CM | POA: Diagnosis not present

## 2024-07-25 DIAGNOSIS — R7309 Other abnormal glucose: Secondary | ICD-10-CM | POA: Diagnosis not present

## 2024-07-26 ENCOUNTER — Other Ambulatory Visit: Payer: Self-pay | Admitting: Orthopaedic Surgery

## 2024-07-26 ENCOUNTER — Other Ambulatory Visit: Payer: Self-pay

## 2024-07-26 DIAGNOSIS — D631 Anemia in chronic kidney disease: Secondary | ICD-10-CM | POA: Diagnosis not present

## 2024-07-26 DIAGNOSIS — Z471 Aftercare following joint replacement surgery: Secondary | ICD-10-CM | POA: Diagnosis not present

## 2024-07-26 DIAGNOSIS — K219 Gastro-esophageal reflux disease without esophagitis: Secondary | ICD-10-CM | POA: Diagnosis not present

## 2024-07-26 DIAGNOSIS — I129 Hypertensive chronic kidney disease with stage 1 through stage 4 chronic kidney disease, or unspecified chronic kidney disease: Secondary | ICD-10-CM | POA: Diagnosis not present

## 2024-07-26 DIAGNOSIS — Z7982 Long term (current) use of aspirin: Secondary | ICD-10-CM | POA: Diagnosis not present

## 2024-07-26 DIAGNOSIS — Z905 Acquired absence of kidney: Secondary | ICD-10-CM | POA: Diagnosis not present

## 2024-07-26 DIAGNOSIS — Z96641 Presence of right artificial hip joint: Secondary | ICD-10-CM | POA: Diagnosis not present

## 2024-07-26 DIAGNOSIS — E039 Hypothyroidism, unspecified: Secondary | ICD-10-CM | POA: Diagnosis not present

## 2024-07-26 DIAGNOSIS — Z974 Presence of external hearing-aid: Secondary | ICD-10-CM | POA: Diagnosis not present

## 2024-07-26 DIAGNOSIS — J45909 Unspecified asthma, uncomplicated: Secondary | ICD-10-CM | POA: Diagnosis not present

## 2024-07-26 DIAGNOSIS — N184 Chronic kidney disease, stage 4 (severe): Secondary | ICD-10-CM | POA: Diagnosis not present

## 2024-07-26 DIAGNOSIS — Z87440 Personal history of urinary (tract) infections: Secondary | ICD-10-CM | POA: Diagnosis not present

## 2024-07-26 DIAGNOSIS — E782 Mixed hyperlipidemia: Secondary | ICD-10-CM | POA: Diagnosis not present

## 2024-07-26 DIAGNOSIS — H9190 Unspecified hearing loss, unspecified ear: Secondary | ICD-10-CM | POA: Diagnosis not present

## 2024-07-26 DIAGNOSIS — M1990 Unspecified osteoarthritis, unspecified site: Secondary | ICD-10-CM | POA: Diagnosis not present

## 2024-07-26 DIAGNOSIS — Z8551 Personal history of malignant neoplasm of bladder: Secondary | ICD-10-CM | POA: Diagnosis not present

## 2024-07-26 DIAGNOSIS — G4730 Sleep apnea, unspecified: Secondary | ICD-10-CM | POA: Diagnosis not present

## 2024-07-26 DIAGNOSIS — G8929 Other chronic pain: Secondary | ICD-10-CM | POA: Diagnosis not present

## 2024-07-26 DIAGNOSIS — Z9049 Acquired absence of other specified parts of digestive tract: Secondary | ICD-10-CM | POA: Diagnosis not present

## 2024-07-26 DIAGNOSIS — Z936 Other artificial openings of urinary tract status: Secondary | ICD-10-CM | POA: Diagnosis not present

## 2024-07-26 DIAGNOSIS — R7303 Prediabetes: Secondary | ICD-10-CM | POA: Diagnosis not present

## 2024-07-26 NOTE — Patient Outreach (Signed)
 Complex Care Management   Visit Note  07/26/2024  Name:  [name] [name] MRN: [id number] DOB: Nov 01, 1932  Situation: Referral received for Complex Care Management related to Chronic Kidney Disease I obtained verbal consent from Patient.  Visit completed with [name] [name]  on the phone, he was in good spirits.  He had right hip replacement surgery 07/14/24, back at home with great family support/caregiving, received a few sessions of HHPT, they have discharged him, continues to have bandage in place, will see Ortho surgeon on 07/27/24 to remove bandage, learn of plan if he needs continued PT.  Reports his pain is under control, has pain meds to take as needed.  He does report some difficulty sleeping while trying to protect his right hip incision/bandage.  Denies constipation/fever/shortness of breath/dizziness.  .    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   BPH (benign prostatic hyperplasia)    CKD (chronic kidney disease), stage III (HCC)    STAGE 3  B CKD per lov 10-16-2020 dr mateo [facility] on chart   Dyspnea    Fatigue    MIDDLE OF DAY ON OCCASION   Frequency of urination    GERD (gastroesophageal reflux disease)    on protonix    Hematuria    History of colon polyps    HLD (hyperlipidemia)    HOH (hard of hearing)    slightly hoh right worse than left- no HAs   HTN (hypertension)    Hypothyroidism    Incontinence of urine    weras pads all the time   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   Right Buccal Cheek   Pre-diabetes    Recurrent bladder papillary carcinoma (HCC) hx 2012 and 2014-- urologist- dr sherrilee   s/p  TURBT 08-16-2017  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   Renal mass 09/02/2022    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 10/02/2023   Sleep apnea    uses cpap set on 20 /4   Trigger finger of left hand 07/04/2021   last few months per pt   UTI (urinary tract infection) 08/20/2020   Weakness of extremity    left knee   Wears glasses     Assessment: Patient Reported Symptoms:  Cognitive Cognitive Status: Alert and oriented to person, place, and time, Normal speech and language skills      Neurological Neurological Review of Symptoms: No symptoms reported    HEENT HEENT Symptoms Reported: No symptoms reported      Cardiovascular Cardiovascular Symptoms Reported: No symptoms reported    Respiratory Respiratory Symptoms Reported: No symptoms reported    Endocrine Endocrine Symptoms Reported: Not assessed    Gastrointestinal Additional Gastrointestinal Details: Bowel plan in place: BM's every other day, taking Senna once daily instead of every other day, taking Miralax in the evening.  Denies constipation. Gastrointestinal Self-Management Outcome: 4 (good)    Genitourinary Genitourinary Symptoms Reported: No symptoms reported Additional Genitourinary Details: Hx of Bladder cancer, has Urostomy    Integumentary Additional Integumentary Details: Tomorrow he will be seeing Ortho surgeon to remove bandage.He is keeping bandage dry, denies concerning drainage.    Musculoskeletal Musculoskelatal Symptoms Reviewed: Unsteady gait Additional Musculoskeletal Details:  Was working with HHPT, he has been discharged and will see Ortho surgeon 07/27/24 and will discuss if he needs more PT.  he has been using the steps with        Psychosocial Psychosocial Symptoms Reported: No symptoms reported Behavioral Health Self-Management Outcome: 4 (good)        07/26/2024    PHQ2-9 Depression Screening   Little interest or pleasure in doing things    Feeling down, depressed, or hopeless    PHQ-2 - Total Score    Trouble falling or staying asleep, or sleeping too much     Feeling tired or having little energy    Poor appetite or overeating     Feeling bad about yourself - or that you are a failure or have let yourself or your family down    Trouble concentrating on things, such as reading the newspaper or watching television    Moving or speaking so slowly that other people could have noticed.  Or the opposite - being so fidgety or restless that you have been moving around a lot more than usual    Thoughts that you would be better off dead, or hurting yourself in some way    PHQ2-9 Total Score    If you checked off any problems, how difficult have these problems made it for you to do your work, take care of things at home, or get along with other people    Depression Interventions/Treatment      There were no vitals filed for this visit.  Medications Reviewed Today     Reviewed by Lucian Santana LABOR, RN (Registered Nurse) on 07/26/24 at 1640  Med List Status: <None>   Medication Order Taking? Sig Documenting Provider Last Dose Status Informant  acetaminophen  (TYLENOL ) 500 MG tablet [id number]  Take 1,000 mg by mouth every 6 (six) hours as needed for moderate pain (pain score 4-6). [provider]  Active Self  amLODipine  (NORVASC ) 10 MG tablet [id number]  Take 1 tablet (10 mg total) by mouth daily. Gonfa, Taye T, MD  Active Self  aspirin  81 MG chewable tablet [id number] Yes Chew 1 tablet (81 mg total) by mouth 2 (two) times daily. Vernetta Lonni GRADE, MD  Active   ferrous sulfate  325 (65 FE) MG tablet [id number]  Take 325 mg by mouth daily with supper. [provider]  Active Self  finasteride  (PROSCAR ) 5 MG tablet [id number]  Take 5 mg by mouth every evening. [provider]  Active Self  HYDROcodone -acetaminophen  (NORCO/VICODIN) 5-325 MG tablet [id number] Yes Take 1-2 tablets by mouth every 6 (six) hours as needed for moderate pain (pain score 4-6). Vernetta Lonni GRADE, MD  Active   levothyroxine  (SYNTHROID , LEVOTHROID) 50 MCG  tablet [id number]  Take 50 mcg by mouth daily before breakfast. [provider]  Active Self           Med Note HARLE, ANAIS   Sat Sep 11, 2017  8:33 AM)    methocarbamol  (ROBAXIN ) 500 MG tablet [id number] Yes Take 1 tablet (500 mg total) by mouth every 6 (six) hours as needed for muscle spasms. Vernetta Lonni GRADE, MD  Active   pantoprazole  (PROTONIX ) 20 MG tablet [id number]  Take 20 mg by mouth at bedtime. [provider]  Active Self           Med Note ALLEGRA, [facility] I   Thu Mar 02, 2024  3:26 AM)    rosuvastatin  (CRESTOR ) 5 MG tablet [id number]  Take 5 mg  by mouth at bedtime. [provider]  Active Self  senna-docusate (SENOKOT-S) 8.6-50 MG tablet [id number] Yes Take 1 tablet by mouth 2 (two) times daily. While taking strong pain meds to prevent constipation Manny, Ricardo KATHEE Raddle., MD  Active Self           Med Note [facility], [facility] I   Thu Mar 02, 2024  3:28 AM)    traMADol (ULTRAM) 50 MG tablet [id number]  Take 50 mg by mouth 2 (two) times daily as needed for moderate pain (pain score 4-6). [provider]  Active Self            Recommendation:   Specialty provider follow-up Ortho surgeon 07/27/24, Oncology 08/08/24 Cardiology 11/06/24  Follow Up Plan:   Telephone follow-up in 1 month  Santana Stamp BSN, CCM   [facility] Population Health RN Care Manager Direct Dial: [phone]  Fax: [phone]

## 2024-07-26 NOTE — Patient Instructions (Signed)
 Visit Information  Thank you for taking time to visit with me today. Please don't hesitate to contact me if I can be of assistance to you before our next scheduled appointment.  Your next care management appointment is by telephone on Wednesday, October 1st  at 1:30pm.    Please call the care guide team at [phone] if you need to cancel, schedule, or reschedule an appointment.   A reminder to ALL patients/family/friends, please call the [place]  National Suicide Prevention Lifeline: (873)626-8919 or TTY: 5066470161 TTY (418)[phone]) to talk to a trained counselor if you are experiencing a Mental Health or Behavioral Health Crisis or need someone to talk to.  Santana Stamp BSN, CCM [place]  [facility] Population Health RN Care Manager Direct Dial: [phone]  Fax: [phone]

## 2024-07-27 ENCOUNTER — Encounter: Payer: Self-pay | Admitting: Orthopaedic Surgery

## 2024-07-27 ENCOUNTER — Ambulatory Visit (INDEPENDENT_AMBULATORY_CARE_PROVIDER_SITE_OTHER): Admitting: Orthopaedic Surgery

## 2024-07-27 DIAGNOSIS — Z96641 Presence of right artificial hip joint: Secondary | ICD-10-CM

## 2024-07-27 MED ORDER — HYDROCODONE-ACETAMINOPHEN 5-325 MG PO TABS: 1.0000 | ORAL_TABLET | Freq: Four times a day (QID) | ORAL | 0 refills | Status: DC | PRN

## 2024-07-27 MED ORDER — METHOCARBAMOL 500 MG PO TABS: 500.0000 mg | ORAL_TABLET | Freq: Four times a day (QID) | ORAL | 0 refills | Status: DC | PRN

## 2024-07-27 NOTE — Progress Notes (Signed)
 The patient is an 89 year old gentleman who is here today for his first visit status post a right total hip arthroplasty.  He is ambulating with a rolling walker and states he is doing well.  He is on hydrocodone  and methocarbamol  and does need another refill of those medications.  Family is with him today as well.  He has been compliant with a baby aspirin  twice daily.  He had stopped aspirin  before surgery for other reasons.  Examination of his right hip incision shows that it looks good.  Staples are in removed and Steri-Strips applied.  His calves are soft.  He does not have any foot and ankle swelling.  He will slowly increase his activities as comfort allows.  He can drive if he is safe driving.  He should not have a narcotic in his system when he gets behind the wheel.  He can stop his baby aspirin  as well.  We will see him back in a month to see how he is doing overall from a mobility standpoint but no x-rays are needed.

## 2024-08-07 DIAGNOSIS — C678 Malignant neoplasm of overlapping sites of bladder: Secondary | ICD-10-CM | POA: Diagnosis not present

## 2024-08-07 NOTE — Progress Notes (Unsigned)
 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     02/04/24 cystoprostatectomy and BPLND. final pathology showed HG papillary UC pT1aN0 UC.  Margins and lymph nodes were negative for malignancy.  pT3a prostate adenocarcinoma.  Will continue follow-up with urology for long-term surveillance  Chronic renal insufficiency.  Report no clinical change. Good urine output.  Will continue follow-up with nephrology.  History of low B12.  Repeat labs today with history of ileal conduit. Assessment & Plan Malignant neoplasm of urinary bladder, unspecified site [facility]) Has follow up with Urology coming up.  Low serum vitamin B12 Repeat b12 today. History of ileal conduit Continue B12 500 mcg daily  Acquired hypothyroidism Continue levothryoxine 50 mcg fT4 today CKD (chronic kidney disease) stage 4, GFR 15-29 ml/min (HCC) - Baseline Scr 3.0 Continue follow up with nephrology Prostatic adenocarcinoma [facility]) Will follow up with Urology for surveillance in the future Normocytic anemia Repeat B12 and ferritin, iron panel with next visit in May  Orders Placed This Encounter  Procedures   CBC with Differential (Cancer Center Only)    Standing Status:   Future    Expiration Date:   08/08/2025   Vitamin B12    Standing Status:   Future    Expiration Date:   08/08/2025   CMP (Cancer Center only)    Standing Status:   Future    Expiration Date:   08/08/2025   Iron and Iron Binding Capacity ([facility] only)    Standing Status:   Future    Expiration Date:   08/08/2025   Ferritin    Standing Status:    Future    Expiration Date:   08/08/2025     Pauletta JAYSON Chihuahua, MD  INTERVAL HISTORY: Patient returns for follow-up.  Overall he is recovering well from his previous surgery.  Recently had hip replacement and walking better.  No coughing or shortness of breath.  Report of urine output.  He may not be drinking enough fluid.  Currently not taking B12.  He is taking iron daily.  Oncology History Overview Note  2000 BPH with elevated PSA. Report biopsy was benign.   2011 to now with multiple TURBT   2021 TaG3 bladder cancer. TURBT + BCG x 6  2023 TaG1 bladder + left renal pelvis cancer, failed [name]  >>  08/2022 Left nephroureterectomy + RPLND. 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  06/2023 cysto w/ TURBT multifocal papillary bladder tumor recurrence (dome, left trigone, bladder neck, prostate urethral tissue) ==> TaG3 with negative muscle  Path: A. BLADDER TUMOR, TURBT:  Noninvasive high grade papillary urothelial carcinoma with inverted  growth pattern  muscularis propria (detrusor muscle) is present and not involved by  carcinoma   B. BLADDER TUMOR, NECK, BIOPSY:  Noninvasive high-grade papillary urothelial carcinoma with inverted  growth pattern  Submucosa, prostatic glands and muscularis is present and not involved  by carcinoma   C. BLADDER TUMOR, BASE, BIOPSY:  Noninvasive high grade papillary urothelial carcinoma  Muscularis propria is present and not involved by carcinoma       Malignant neoplasm of urinary bladder (HCC)  09/15/2019 Initial Diagnosis   Malignant neoplasm of urinary bladder (HCC)    08/05/2023 Cancer Staging   Staging form: Urinary Bladder, AJCC 8th Edition - Clinical: Stage 0a (rcTa, cN0, cM0) - Signed by Tina Pauletta BROCKS, MD on 08/05/2023 Stage prefix: Recurrence WHO/ISUP grade (low/high): High Grade Histologic grading system: 2 grade system   09/16/2023 - 09/16/2023 Chemotherapy   Patient is on Treatment Plan : BLADDER Pembrolizumab  (200) q21d     10/01/2023 Pathology Results   Presented with hematuria and underwent TURBT  A. BLADDER TUMOR, TURBT:  Noninvasive high grade papillary urothelial carcinoma  Muscularis propria (detrusor muscle) is not present    02/04/2024 Pathology Results   pT1 N0 0.9 cm High grade papillary urothelial carcinoma involving left residual ureter. The carcinoma invades subepithelial connective tissue (pT1)  4 negative lymph nodes for malignancy All margins negative   03/03/2024 Cancer Staging   Staging form: Urinary Bladder, AJCC 8th Edition - Pathologic: Stage I (pT1, pN0, cM0) - Signed by Tina Pauletta BROCKS, MD on 03/03/2024 WHO/ISUP grade (low/high): High Grade Histologic grading system: 2 grade system Residual tumor (R): R0   Prostatic adenocarcinoma (HCC)  02/04/2024 Initial Diagnosis   Prostatic adenocarcinoma (HCC) Found from cystoprostatectomy from bladder cancer. pT3a +EPE. Margins negative. GS 4+3=7 (grade group 3)    03/03/2024 Cancer Staging   Staging form: Prostate, AJCC 8th Edition - Clinical: Stage IIIB (cT3a, cN0, cM0, Grade Group: 3) - Signed by Tina Pauletta BROCKS, MD on 03/03/2024 Gleason score: 7 Histologic grading system: 5 grade system      PHYSICAL EXAMINATION: ECOG PERFORMANCE STATUS: 2 - Symptomatic, <50% confined to bed  Vitals:   08/08/24 0935  BP: 130/64  Pulse: 93  Resp: 18  Temp: (!) 97.3 F (36.3 C)  SpO2: 100%   Filed Weights   08/08/24 0935  Weight: 157 lb 1.6 oz (71.3 kg)    GENERAL: alert, no distress and comfortable SKIN: skin color normal  LUNGS: clear to auscultation and percussion  with normal breathing effort HEART: regular rate & rhythm  ABDOMEN: abdomen soft, non-tender and nondistended. Musculoskeletal: no edema  Relevant data reviewed during this visit included labs.

## 2024-08-07 NOTE — Assessment & Plan Note (Signed)
 Has follow up with Urology coming up.

## 2024-08-07 NOTE — Assessment & Plan Note (Signed)
 Repeat b12 today. History of ileal conduit Continue B12 500 mcg daily

## 2024-08-08 ENCOUNTER — Inpatient Hospital Stay

## 2024-08-08 ENCOUNTER — Telehealth: Payer: Self-pay

## 2024-08-08 ENCOUNTER — Other Ambulatory Visit: Payer: Self-pay

## 2024-08-08 VITALS — BP 130/64 | HR 93 | Temp 97.3°F | Resp 18 | Wt 157.1 lb

## 2024-08-08 DIAGNOSIS — E039 Hypothyroidism, unspecified: Secondary | ICD-10-CM | POA: Insufficient documentation

## 2024-08-08 DIAGNOSIS — C679 Malignant neoplasm of bladder, unspecified: Secondary | ICD-10-CM

## 2024-08-08 DIAGNOSIS — E538 Deficiency of other specified B group vitamins: Secondary | ICD-10-CM | POA: Insufficient documentation

## 2024-08-08 DIAGNOSIS — D649 Anemia, unspecified: Secondary | ICD-10-CM | POA: Diagnosis not present

## 2024-08-08 DIAGNOSIS — N184 Chronic kidney disease, stage 4 (severe): Secondary | ICD-10-CM | POA: Diagnosis not present

## 2024-08-08 DIAGNOSIS — C61 Malignant neoplasm of prostate: Secondary | ICD-10-CM | POA: Diagnosis not present

## 2024-08-08 LAB — CMP (CANCER CENTER ONLY)
ALT: 11 U/L (ref 0–44)
AST: 13 U/L — ABNORMAL LOW (ref 15–41)
Albumin: 4.2 g/dL (ref 3.5–5.0)
Alkaline Phosphatase: 88 U/L (ref 38–126)
Anion gap: 11 (ref 5–15)
BUN: 76 mg/dL — ABNORMAL HIGH (ref 8–23)
CO2: 19 mmol/L — ABNORMAL LOW (ref 22–32)
Calcium: 9.8 mg/dL (ref 8.9–10.3)
Chloride: 110 mmol/L (ref 98–111)
Creatinine: 5.14 mg/dL — ABNORMAL HIGH (ref 0.61–1.24)
GFR, Estimated: 10 mL/min — ABNORMAL LOW (ref >60)
Glucose, Bld: 97 mg/dL (ref 70–99)
Potassium: 4.2 mmol/L (ref 3.5–5.1)
Sodium: 140 mmol/L (ref 135–145)
Total Bilirubin: 0.7 mg/dL (ref 0.0–1.2)
Total Protein: 7.8 g/dL (ref 6.5–8.1)

## 2024-08-08 LAB — CBC WITH DIFFERENTIAL (CANCER CENTER ONLY)
Abs Immature Granulocytes: 0.02 K/uL (ref 0.00–0.07)
Basophils Absolute: 0.1 K/uL (ref 0.0–0.1)
Basophils Relative: 1 %
Eosinophils Absolute: 0.7 K/uL — ABNORMAL HIGH (ref 0.0–0.5)
Eosinophils Relative: 9 %
HCT: 30 % — ABNORMAL LOW (ref 39.0–52.0)
Hemoglobin: 9.8 g/dL — ABNORMAL LOW (ref 13.0–17.0)
Immature Granulocytes: 0 %
Lymphocytes Relative: 9 %
Lymphs Abs: 0.8 K/uL (ref 0.7–4.0)
MCH: 29 pg (ref 26.0–34.0)
MCHC: 32.7 g/dL (ref 30.0–36.0)
MCV: 88.8 fL (ref 80.0–100.0)
Monocytes Absolute: 0.8 K/uL (ref 0.1–1.0)
Monocytes Relative: 10 %
Neutro Abs: 6.1 K/uL (ref 1.7–7.7)
Neutrophils Relative %: 71 %
Platelet Count: 364 K/uL (ref 150–400)
RBC: 3.38 MIL/uL — ABNORMAL LOW (ref 4.22–5.81)
RDW: 17.4 % — ABNORMAL HIGH (ref 11.5–15.5)
WBC Count: 8.5 K/uL (ref 4.0–10.5)
nRBC: 0 % (ref 0.0–0.2)

## 2024-08-08 LAB — VITAMIN B12: Vitamin B-12: 490 pg/mL (ref 180–914)

## 2024-08-08 LAB — SAMPLE TO BLOOD BANK

## 2024-08-08 LAB — T4, FREE: Free T4: 0.83 ng/dL (ref 0.61–1.12)

## 2024-08-08 NOTE — Assessment & Plan Note (Addendum)
 Will follow up with Urology for surveillance in the future

## 2024-08-08 NOTE — Assessment & Plan Note (Signed)
 Repeat B12 and ferritin, iron panel with next visit in May

## 2024-08-08 NOTE — Telephone Encounter (Signed)
 Scheduled appointments per 9/16 los. Talked with the patient and he is aware of the made appointments.

## 2024-08-08 NOTE — Assessment & Plan Note (Addendum)
 Continue follow-up with nephrology.

## 2024-08-08 NOTE — Assessment & Plan Note (Addendum)
 Continue levothryoxine 50 mcg fT4 today

## 2024-08-09 LAB — PROSTATE-SPECIFIC AG, SERUM (LABCORP): Prostate Specific Ag, Serum: <0.1 ng/mL (ref 0.0–4.0)

## 2024-08-11 DIAGNOSIS — C662 Malignant neoplasm of left ureter: Secondary | ICD-10-CM | POA: Diagnosis not present

## 2024-08-11 DIAGNOSIS — N1330 Unspecified hydronephrosis: Secondary | ICD-10-CM | POA: Diagnosis not present

## 2024-08-11 DIAGNOSIS — N134 Hydroureter: Secondary | ICD-10-CM | POA: Diagnosis not present

## 2024-08-22 DIAGNOSIS — D649 Anemia, unspecified: Secondary | ICD-10-CM | POA: Diagnosis not present

## 2024-08-22 DIAGNOSIS — N184 Chronic kidney disease, stage 4 (severe): Secondary | ICD-10-CM | POA: Diagnosis not present

## 2024-08-23 ENCOUNTER — Telehealth: Payer: Self-pay

## 2024-08-31 ENCOUNTER — Other Ambulatory Visit: Payer: Self-pay | Admitting: Urology

## 2024-08-31 DIAGNOSIS — C670 Malignant neoplasm of trigone of bladder: Secondary | ICD-10-CM | POA: Diagnosis not present

## 2024-08-31 DIAGNOSIS — N131 Hydronephrosis with ureteral stricture, not elsewhere classified: Secondary | ICD-10-CM | POA: Diagnosis not present

## 2024-09-01 DIAGNOSIS — N2581 Secondary hyperparathyroidism of renal origin: Secondary | ICD-10-CM | POA: Diagnosis not present

## 2024-09-01 DIAGNOSIS — I120 Hypertensive chronic kidney disease with stage 5 chronic kidney disease or end stage renal disease: Secondary | ICD-10-CM | POA: Diagnosis not present

## 2024-09-01 DIAGNOSIS — D631 Anemia in chronic kidney disease: Secondary | ICD-10-CM | POA: Diagnosis not present

## 2024-09-01 DIAGNOSIS — Z905 Acquired absence of kidney: Secondary | ICD-10-CM | POA: Diagnosis not present

## 2024-09-01 DIAGNOSIS — C679 Malignant neoplasm of bladder, unspecified: Secondary | ICD-10-CM | POA: Diagnosis not present

## 2024-09-01 DIAGNOSIS — E875 Hyperkalemia: Secondary | ICD-10-CM | POA: Diagnosis not present

## 2024-09-01 DIAGNOSIS — N185 Chronic kidney disease, stage 5: Secondary | ICD-10-CM | POA: Diagnosis not present

## 2024-09-01 DIAGNOSIS — E872 Acidosis, unspecified: Secondary | ICD-10-CM | POA: Diagnosis not present

## 2024-09-04 ENCOUNTER — Other Ambulatory Visit: Payer: Self-pay | Admitting: Urology

## 2024-09-11 ENCOUNTER — Encounter (HOSPITAL_COMMUNITY): Payer: Self-pay

## 2024-09-11 ENCOUNTER — Other Ambulatory Visit: Payer: Self-pay

## 2024-09-11 ENCOUNTER — Encounter (HOSPITAL_COMMUNITY)
Admission: RE | Admit: 2024-09-11 | Discharge: 2024-09-11 | Disposition: A | Discharge status: Home / Self Care | Source: Ambulatory Visit | Attending: Urology | Admitting: Urology

## 2024-09-11 VITALS — BP 131/65 | HR 73 | Temp 97.6°F | Ht 68.0 in | Wt 156.0 lb

## 2024-09-11 DIAGNOSIS — Z01812 Encounter for preprocedural laboratory examination: Secondary | ICD-10-CM | POA: Diagnosis not present

## 2024-09-11 DIAGNOSIS — I10 Essential (primary) hypertension: Secondary | ICD-10-CM | POA: Diagnosis not present

## 2024-09-11 LAB — COMPREHENSIVE METABOLIC PANEL WITH GFR
ALT: 11 U/L (ref 0–44)
AST: 19 U/L (ref 15–41)
Albumin: 4.1 g/dL (ref 3.5–5.0)
Alkaline Phosphatase: 80 U/L (ref 38–126)
Anion gap: 14 (ref 5–15)
BUN: 57 mg/dL — ABNORMAL HIGH (ref 8–23)
CO2: 22 mmol/L (ref 22–32)
Calcium: 9.5 mg/dL (ref 8.9–10.3)
Chloride: 105 mmol/L (ref 98–111)
Creatinine, Ser: 4.6 mg/dL — ABNORMAL HIGH (ref 0.61–1.24)
GFR, Estimated: 12 mL/min — ABNORMAL LOW (ref >60)
Glucose, Bld: 79 mg/dL (ref 70–99)
Potassium: 4.5 mmol/L (ref 3.5–5.1)
Sodium: 141 mmol/L (ref 135–145)
Total Bilirubin: 0.4 mg/dL (ref 0.0–1.2)
Total Protein: 7.1 g/dL (ref 6.5–8.1)

## 2024-09-11 LAB — CBC
HCT: 31.8 % — ABNORMAL LOW (ref 39.0–52.0)
Hemoglobin: 9.8 g/dL — ABNORMAL LOW (ref 13.0–17.0)
MCH: 28.9 pg (ref 26.0–34.0)
MCHC: 30.8 g/dL (ref 30.0–36.0)
MCV: 93.8 fL (ref 80.0–100.0)
Platelets: 309 K/uL (ref 150–400)
RBC: 3.39 MIL/uL — ABNORMAL LOW (ref 4.22–5.81)
RDW: 15.9 % — ABNORMAL HIGH (ref 11.5–15.5)
WBC: 7.1 K/uL (ref 4.0–10.5)
nRBC: 0 % (ref 0.0–0.2)

## 2024-09-11 NOTE — Progress Notes (Signed)
 Lab. Results: Creatinine: 4.60 Hemoglobin: 9.8

## 2024-09-11 NOTE — Progress Notes (Signed)
 For Anesthesia: PCP - Verdia Lombard, MD  Cardiologist - Lonni Slain, MD  Nephrology- Mateo Romney, MD at [facility]  Bowel Prep reminder:  Chest x-ray - 03/01/24 EKG - 05/18/24 Stress Test - 08/22/20 ECHO - 06/07/24 Cardiac Cath -  Pacemaker/ICD device last checked: Pacemaker orders received: Device Rep notified:  Spinal Cord Stimulator:N/A  Sleep Study - Yes CPAP - NO  Fasting Blood Sugar -  Checks Blood Sugar _____ times a day Date and result of last Hgb A1c-  Last dose of GLP1 agonist- N/A GLP1 instructions: Hold 7 days prior to schedule (Hold 24 hours-daily)   Last dose of SGLT-2 inhibitors- N/A SGLT-2 instructions: Hold 72 hours prior to surgery  Blood Thinner Instructions:N/A Last Dose: Time last taken:  Aspirin  Instructions:N/A Last Dose: Time last taken:  Activity level: Can go up a flight of stairs and activities of daily living without stopping and without chest pain and/or shortness of breath   Able to exercise without chest pain and/or shortness of breath  Anesthesia review: HTN, CKD4 d/t Left nephrectomy in 2023, also s/p bladder and prostate removal,  anemia, Pre-DM no meds,  GERD,   OSA- no BiPAP use now   Patient denies shortness of breath, fever, cough and chest pain at PAT appointment   Patient verbalized understanding of instructions that were reviewed over the telephone.

## 2024-09-11 NOTE — Patient Instructions (Signed)
 SURGICAL WAITING ROOM VISITATION Patients having surgery or a procedure may have no more than 2 support people in the waiting area - these visitors may rotate in the visitor waiting room.   Due to an increase in RSV and influenza rates and associated hospitalizations, children ages 50 and under may not visit patients in [facility] hospitals. If the patient needs to stay at the hospital during part of their recovery, the visitor guidelines for inpatient rooms apply.  PRE-OP VISITATION  Pre-op nurse will coordinate an appropriate time for 1 support person to accompany the patient in pre-op.  This support person may not rotate.  This visitor will be contacted when the time is appropriate for the visitor to come back in the pre-op area.  Please refer to the [facility] website for the visitor guidelines for Inpatients (after your surgery is over and you are in a regular room).  You are not required to quarantine at this time prior to your surgery. However, you must do this: Hand Hygiene often Do NOT share personal items Notify your provider if you are in close contact with someone who has COVID or you develop fever 100.4 or greater, new onset of sneezing, cough, sore throat, shortness of breath or body aches.  If you test positive for Covid or have been in contact with anyone that has tested positive in the last 10 days please notify you surgeon.    Your procedure is scheduled on:  09/13/24  Report to [facility] Main Entrance: [place] entrance where the [employer] is available.   Report to admitting at: 10:00 AM  Call this number if you have any questions or problems the morning of surgery [phone]  FOLLOW ANY ADDITIONAL PRE OP INSTRUCTIONS YOU RECEIVED FROM YOUR SURGEON'S OFFICE!!!  Do not eat food after Midnight the night prior to your surgery/procedure  After Midnight you may have the following liquids until: 9:00 AM DAY OF SURGERY  Clear Liquid Diet Water  Black  Coffee (sugar ok, NO MILK/CREAM OR CREAMERS)  Tea (sugar ok, NO MILK/CREAM OR CREAMERS) regular and decaf                             Plain Jell-O  with no fruit (NO RED)                                           Fruit ices (not with fruit pulp, NO RED)                                     Popsicles (NO RED)                                                                  Juice: NO CITRUS JUICES: only apple, WHITE grape, WHITE cranberry Sports drinks like Gatorade or Powerade (NO RED)   Oral Hygiene is also important to reduce your risk of infection.        Remember - BRUSH YOUR TEETH THE MORNING OF SURGERY WITH YOUR REGULAR TOOTHPASTE  Do NOT smoke after Midnight the night before surgery.  STOP TAKING all Vitamins, Herbs and supplements 1 week before your surgery.   Take ONLY these medicines the morning of surgery with A SIP OF WATER : levothyroxine .                   You may not have any metal on your body including hair pins, jewelry, and body piercing  Do not wear lotions, powders, perfumes / cologne, or deodorant   Men may shave face and neck.  Contacts, Hearing Aids, dentures or bridgework may not be worn into surgery. DENTURES WILL BE REMOVED PRIOR TO SURGERY PLEASE DO NOT APPLY Poly grip OR ADHESIVES!!!  You may bring a small overnight bag with you on the day of surgery, only pack items that are not valuable. [facility] IS NOT RESPONSIBLE   FOR VALUABLES THAT ARE LOST OR STOLEN.   Patients discharged on the day of surgery will not be allowed to drive home.  Someone NEEDS to stay with you for the first 24 hours after anesthesia.  Do not bring your home medications to the hospital. The Pharmacy will dispense medications listed on your medication list to you during your admission in the Hospital.  Special Instructions: Bring a copy of your healthcare power of attorney and living will documents the day of surgery, if you wish to have them scanned into your  Medical  Records- EPIC  Please read over the following fact sheets you were given: IF YOU HAVE QUESTIONS ABOUT YOUR PRE-OP INSTRUCTIONS, PLEASE CALL [phone]   [facility] - Preparing for Surgery      Before surgery, you can play an important role.  Because skin is not sterile, your skin needs to be as free of germs as possible.  You can reduce the number of germs on your skin by washing with CHG (chlorahexidine gluconate) soap before surgery.  CHG is an antiseptic cleaner which kills germs and bonds with the skin to continue killing germs even after washing. Please DO NOT use if you have an allergy to CHG or antibacterial soaps.  If your skin becomes reddened/irritated stop using the CHG and inform your nurse when you arrive at Short Stay. Do not shave (including legs and underarms) for at least 48 hours prior to the first CHG shower.  You may shave your face/neck.  Please follow these instructions carefully:  1.  Shower with CHG Soap the night before surgery ONLY (DO NOT USE THE SOAP THE MORNING OF SURGERY).  2.  If you choose to wash your hair, wash your hair first as usual with your normal  shampoo.  3.  After you shampoo, rinse your hair and body thoroughly to remove the shampoo.                             4.  Use CHG as you would any other liquid soap.  You can apply chg directly to the skin and wash.  Gently with a scrungie or clean washcloth.  5.  Apply the CHG Soap to your body ONLY FROM THE NECK DOWN.   Do not use on face/ open                           Wound or open sores. Avoid contact with eyes, ears mouth and genitals (private parts).  Wash face,  Genitals (private parts) with your normal soap.             6.  Wash thoroughly, paying special attention to the area where your  surgery  will be performed.  7.  Thoroughly rinse your body with warm water  from the neck down.  8.  DO NOT shower/wash with your normal soap after using and rinsing off the CHG Soap.                 9.  Pat yourself dry with a clean towel.            10.  Wear clean pajamas.            11.  Place clean sheets on your bed the night of your first shower and do not  sleep with pets.  Day of Surgery : Do not apply any CHG, lotions/deodorants the morning of surgery.  Please wear clean clothes to the hospital/surgery center.   FAILURE TO FOLLOW THESE INSTRUCTIONS MAY RESULT IN THE CANCELLATION OF YOUR SURGERY  PATIENT SIGNATURE_________________________________  NURSE SIGNATURE__________________________________  ________________________________________________________________________

## 2024-09-13 ENCOUNTER — Ambulatory Visit (HOSPITAL_COMMUNITY): Admitting: Physician Assistant

## 2024-09-13 ENCOUNTER — Encounter (HOSPITAL_COMMUNITY): Payer: Self-pay | Admitting: Urology

## 2024-09-13 ENCOUNTER — Encounter: Admission: RE | Disposition: A | Payer: Self-pay | Source: Home / Self Care | Attending: Urology

## 2024-09-13 ENCOUNTER — Ambulatory Visit (HOSPITAL_COMMUNITY): Admitting: Anesthesiology

## 2024-09-13 ENCOUNTER — Ambulatory Visit (HOSPITAL_COMMUNITY)
Admission: RE | Admit: 2024-09-13 | Discharge: 2024-09-13 | Disposition: A | Discharge status: Home / Self Care | Attending: Urology | Admitting: Urology

## 2024-09-13 ENCOUNTER — Other Ambulatory Visit: Payer: Self-pay

## 2024-09-13 ENCOUNTER — Ambulatory Visit (HOSPITAL_COMMUNITY)

## 2024-09-13 DIAGNOSIS — I129 Hypertensive chronic kidney disease with stage 1 through stage 4 chronic kidney disease, or unspecified chronic kidney disease: Secondary | ICD-10-CM | POA: Insufficient documentation

## 2024-09-13 DIAGNOSIS — N1330 Unspecified hydronephrosis: Secondary | ICD-10-CM | POA: Diagnosis not present

## 2024-09-13 DIAGNOSIS — N1832 Chronic kidney disease, stage 3b: Secondary | ICD-10-CM | POA: Insufficient documentation

## 2024-09-13 DIAGNOSIS — F419 Anxiety disorder, unspecified: Secondary | ICD-10-CM | POA: Diagnosis not present

## 2024-09-13 DIAGNOSIS — C61 Malignant neoplasm of prostate: Secondary | ICD-10-CM | POA: Insufficient documentation

## 2024-09-13 DIAGNOSIS — N184 Chronic kidney disease, stage 4 (severe): Secondary | ICD-10-CM | POA: Diagnosis not present

## 2024-09-13 DIAGNOSIS — E039 Hypothyroidism, unspecified: Secondary | ICD-10-CM

## 2024-09-13 DIAGNOSIS — I10 Essential (primary) hypertension: Secondary | ICD-10-CM

## 2024-09-13 DIAGNOSIS — N131 Hydronephrosis with ureteral stricture, not elsewhere classified: Secondary | ICD-10-CM | POA: Insufficient documentation

## 2024-09-13 DIAGNOSIS — Z905 Acquired absence of kidney: Secondary | ICD-10-CM | POA: Diagnosis not present

## 2024-09-13 HISTORY — PX: CYSTOSCOPY W/ RETROGRADES: SHX1426

## 2024-09-13 HISTORY — PX: CYSTOSCOPY W/ URETERAL STENT PLACEMENT: SHX1429

## 2024-09-13 SURGERY — CYSTOSCOPY, FLEXIBLE, WITH STENT REPLACEMENT
Anesthesia: Monitor Anesthesia Care | Laterality: Right

## 2024-09-13 MED ORDER — PROPOFOL 500 MG/50ML IV EMUL
INTRAVENOUS | Status: DC | PRN
  Administered 2024-09-13: 130 ug/kg/min via INTRAVENOUS

## 2024-09-13 MED ORDER — CHLORHEXIDINE GLUCONATE 0.12 % MT SOLN
15.0000 mL | Freq: Once | OROMUCOSAL | Status: AC
  Administered 2024-09-13: 15 mL via OROMUCOSAL

## 2024-09-13 MED ORDER — FENTANYL CITRATE (PF) 50 MCG/ML IJ SOSY: 25.0000 ug | PREFILLED_SYRINGE | INTRAMUSCULAR | Status: DC | PRN

## 2024-09-13 MED ORDER — ORAL CARE MOUTH RINSE: 15.0000 mL | Freq: Once | OROMUCOSAL | Status: AC

## 2024-09-13 MED ORDER — PROPOFOL 10 MG/ML IV BOLUS
INTRAVENOUS | Status: DC | PRN
  Administered 2024-09-13: 20 mg via INTRAVENOUS

## 2024-09-13 MED ORDER — ONDANSETRON HCL 4 MG/2ML IJ SOLN: 4.0000 mg | Freq: Once | INTRAMUSCULAR | Status: DC | PRN

## 2024-09-13 MED ORDER — CEFTRIAXONE SODIUM 2 G IJ SOLR
2.0000 g | Freq: Once | INTRAMUSCULAR | Status: AC
  Administered 2024-09-13: 2 g via INTRAVENOUS
  Filled 2024-09-13: qty 20

## 2024-09-13 MED ORDER — SODIUM CHLORIDE 0.9 % IR SOLN
Status: DC | PRN
  Administered 2024-09-13: 1000 mL

## 2024-09-13 MED ORDER — EPHEDRINE 5 MG/ML INJ
INTRAVENOUS | Status: AC
  Filled 2024-09-13: qty 5

## 2024-09-13 MED ORDER — IOHEXOL 300 MG/ML  SOLN
INTRAMUSCULAR | Status: DC | PRN
  Administered 2024-09-13: 10 mL

## 2024-09-13 MED ORDER — SODIUM CHLORIDE 0.9 % IV SOLN: INTRAVENOUS | Status: DC

## 2024-09-13 MED ORDER — EPHEDRINE SULFATE (PRESSORS) 25 MG/5ML IV SOSY
PREFILLED_SYRINGE | INTRAVENOUS | Status: DC | PRN
Start: 2024-09-13 — End: 2024-09-13
  Administered 2024-09-13: 10 mg via INTRAVENOUS

## 2024-09-13 MED ORDER — LACTATED RINGERS IV SOLN: INTRAVENOUS | Status: DC

## 2024-09-13 MED ORDER — ACETAMINOPHEN 10 MG/ML IV SOLN: 1000.0000 mg | Freq: Once | INTRAVENOUS | Status: DC | PRN

## 2024-09-13 MED ORDER — PROPOFOL 10 MG/ML IV BOLUS
INTRAVENOUS | Status: AC
  Filled 2024-09-13: qty 20

## 2024-09-13 SURGICAL SUPPLY — 16 items
BAG URO CATCHER STRL LF (MISCELLANEOUS) ×2 IMPLANT
BASKET ZERO TIP NITINOL 2.4FR (BASKET) IMPLANT
CATH URETL OPEN END 6FR 70 (CATHETERS) IMPLANT
CLOTH BEACON ORANGE TIMEOUT ST (SAFETY) ×2 IMPLANT
GLOVE SURG LX STRL 7.5 STRW (GLOVE) ×2 IMPLANT
GOWN STRL REUS W/ TWL XL LVL3 (GOWN DISPOSABLE) ×2 IMPLANT
GUIDEWIRE ANG ZIPWIRE 038X150 (WIRE) ×2 IMPLANT
GUIDEWIRE STR DUAL SENSOR (WIRE) ×2 IMPLANT
KIT TURNOVER KIT A (KITS) ×2 IMPLANT
MANIFOLD NEPTUNE II (INSTRUMENTS) ×2 IMPLANT
NS IRRIG 1000ML POUR BTL (IV SOLUTION) IMPLANT
PACK CYSTO (CUSTOM PROCEDURE TRAY) ×2 IMPLANT
STENT CONTOUR 7FRX24 (STENTS) IMPLANT
SYSTEM UROSTOMY GENTLE TOUCH (WOUND CARE) IMPLANT
TUBE PU 8FR 16IN ENFIT (TUBING) IMPLANT
TUBING CONNECTING 10 (TUBING) ×2 IMPLANT

## 2024-09-13 NOTE — Anesthesia Postprocedure Evaluation (Signed)
 Anesthesia Post Note  Patient: [name] [name]  Procedure(s) Performed: STENT REPLACEMENT (Right) CYSTOSCOPY, WITH RETROGRADE PYELOGRAM (Right)     Patient location during evaluation: PACU Anesthesia Type: MAC Level of consciousness: awake and alert Pain management: pain level controlled Vital Signs Assessment: post-procedure vital signs reviewed and stable Respiratory status: spontaneous breathing, nonlabored ventilation, respiratory function stable and patient connected to nasal cannula oxygen Cardiovascular status: stable and blood pressure returned to baseline Postop Assessment: no apparent nausea or vomiting Anesthetic complications: no   No notable events documented.  Last Vitals:  Vitals:   09/13/24 1330 09/13/24 1344  BP: 117/65 114/71  Pulse: 75 71  Resp: 18 15  Temp: 36.7 C (!) 36.1 C  SpO2: 99% 100%    Last Pain:  Vitals:   09/13/24 1344  TempSrc: Temporal  PainSc: 0-No pain                 [name] [name]

## 2024-09-13 NOTE — Brief Op Note (Signed)
 09/13/2024  12:54 PM  PATIENT:  [name] [name]  89 y.o. male  PRE-OPERATIVE DIAGNOSIS:  URETERAL STRICTURE  POST-OPERATIVE DIAGNOSIS:  * No post-op diagnosis entered *  PROCEDURE:  Procedure(s): CYSTOSCOPY, FLEXIBLE, WITH STENT REPLACEMENT (Right) CYSTOSCOPY, WITH RETROGRADE PYELOGRAM (Right)  SURGEON:  Surgeons and Role:    * Manny, Ricardo KATHEE Raddle., MD - Primary  PHYSICIAN ASSISTANT:   ASSISTANTS: none   ANESTHESIA:   MAC  EBL:  minimal   BLOOD ADMINISTERED:none  DRAINS: Rt Urostomy to gravity   LOCAL MEDICATIONS USED:  NONE  SPECIMEN:  No Specimen  DISPOSITION OF SPECIMEN:  N/A  COUNTS:  YES  TOURNIQUET:  * No tourniquets in log *  DICTATION: .Other Dictation: Dictation Number [id number]  PLAN OF CARE: Discharge to home after PACU  PATIENT DISPOSITION:  PACU - hemodynamically stable.   Delay start of Pharmacological VTE agent (>24hrs) due to surgical blood loss or risk of bleeding: yes

## 2024-09-13 NOTE — Discharge Instructions (Signed)
1 - You may have bloody urine on / off with stent in place. This is normal. ° °2 - Call MD or go to ER for fever >102, severe pain / nausea / vomiting not relieved by medications, or acute change in medical status ° °

## 2024-09-13 NOTE — H&P (Signed)
 [name] [name] is an 89 y.o. male.    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 Follows Dr. Janeann nephrology. Chronic Rt hydro pre-cystectomy, worsened some post-cystectomy with Cr to 4's.  4 - Urinary Diversion - Rt cutaneous ureterostomy (no bowel anastamoses) at cystoprostatectomy 01/2024.  PMH sig for knee surgery, does follow with [facility] but normal stress tests, no blood thinners. 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 Widowed, lives independanly in all [place], kids live very close. His PCP is Lequita Flor MD  Today [name] [name] is seen to proceed with RT ureteral stent placement for some worsned hydro and renal function. No interval fevers. Cr 4.6 with normal K most recnetly.   Past Medical History:  Diagnosis Date   Anemia    Arthritis    lower back bulging disc, hips, knees, thumbs, shoulder   Asthma    as a pre teen   Bladder cancer (HCC) UROLOGIST-  DR MCKENZIE   S/P TURBT 08-16-2017   Bladder wall hemorrhage 09/28/2023   BPH (benign prostatic hyperplasia)    CKD (chronic kidney disease), stage III (HCC)    STAGE 3  B CKD per lov 10-16-2020 dr mateo dolan [facility] on chart   Dyspnea    Fatigue    MIDDLE OF DAY ON OCCASION   Frequency of urination    GERD (gastroesophageal  reflux disease)    on protonix    Hematuria    History of colon polyps    HLD (hyperlipidemia)    HOH (hard of hearing)    slightly hoh right worse than left- no HAs   HTN (hypertension)    Hypothyroidism    Incontinence of urine    weras pads all the time   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   Right Buccal Cheek   Pre-diabetes    Recurrent bladder papillary carcinoma (HCC) hx 2012 and 2014-- urologist- dr sherrilee   s/p  TURBT 08-16-2017  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   Renal mass 09/02/2022   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 10/02/2023   Sleep apnea    uses cpap set on 20 /4   Trigger finger of left hand 07/04/2021   last few months per pt   UTI (urinary tract infection) 08/20/2020   Weakness of extremity    left knee   Wears glasses     Past Surgical History:  Procedure Laterality Date   BCG X 2     AFTER LAST 2 BLADDER TUMOR REMOVALS   CATARACT EXTRACTION, BILATERAL     COLONSCOPY  YRS AGO   CYSTOSCOPY N/A 10/29/2020   Procedure: CYSTOSCOPY;  Surgeon: Elisabeth Valli BIRCH, MD;  Location: [facility];  Service: Urology;  Laterality: N/A;   CYSTOSCOPY N/A 07/08/2021   Procedure: CYSTOSCOPY;  Surgeon: Elisabeth Valli BIRCH, MD;  Location: [facility];  Service: Urology;  Laterality: N/A;   CYSTOSCOPY W/ RETROGRADES Right 07/21/2023   Procedure: CYSTOSCOPY WITH RIGHT RETROGRADE PYELOGRAM;  Surgeon: Alvaro Ricardo KATHEE Mickey., MD;  Location: [facility];  Service: Urology;  Laterality: Right;   CYSTOSCOPY W/ URETERAL STENT PLACEMENT Bilateral 08/16/2017   Procedure: CYSTOSCOPY WITH RETROGRADE PYELOGRAM/URETERAL STENT PLACEMENT;  Surgeon: Sherrilee Belvie CROME, MD;  Location[facility];  Service: Urology;  Laterality: Bilateral;   CYSTOSCOPY W/ URETERAL STENT PLACEMENT Left 09/02/2022   Procedure: CYSTOSCOPY WITH RETROGRADE PYELOGRAM, URETERAL STENT PLACEMENT;  Surgeon: Alvaro Ricardo, MD;   Location: [facility];  Service: Urology;  Laterality: Left;   CYSTOSCOPY WITH BIOPSY N/A 06/25/2020   Procedure: CYSTOSCOPY FULGURATION   BLADDER  BIOPSY;  Surgeon: Elisabeth Valli BIRCH, MD;  Location: [facility];  Service: Urology;  Laterality: N/A;   CYSTOSCOPY WITH FULGERATION N/A 12/08/2019   Procedure: CYSTOSCOPY WITH FULGERATION;  Surgeon: Sherrilee Belvie CROME, MD;  Location: [facility];  Service: Urology;  Laterality: N/A;   CYSTOSCOPY WITH FULGERATION N/A 03/24/2022   Procedure: CYSTOSCOPY WITH FULGERATION;  Surgeon: Elisabeth Valli BIRCH, MD;  Location: [facility];  Service: Urology;  Laterality: N/A;   CYSTOSCOPY WITH RETROGRADE PYELOGRAM, URETEROSCOPY AND STENT PLACEMENT Left 03/24/2022   Procedure: CYSTOSCOPY WITH RETROGRADE PYELOGRAM, DIAGNOSTIC URETEROSCOPY WITH BIOPSY AND STENT PLACEMENT;  Surgeon: Elisabeth Valli BIRCH, MD;  Location: [facility];  Service: Urology;  Laterality: Left;  90 MINUTES   CYSTOSCOPY WITH RETROGRADE PYELOGRAM, URETEROSCOPY AND STENT PLACEMENT Left 04/07/2022   Procedure: CYSTOSCOPY WITH  RETROGRADE PYELOGRAM, DIAGNOSTIC URETEROSCOPY WITH BIOPSY AND STENT EXCHANGE;  Surgeon: Elisabeth Valli BIRCH, MD;  Location: [facility];  Service: Urology;  Laterality: Left;  75 MINUTES   CYSTOSCOPY WITH RETROGRADE PYELOGRAM, URETEROSCOPY AND STENT PLACEMENT Left 08/11/2022   Procedure: CYSTOSCOPY WITH RETROGRADE PYELOGRAM, ANTEGRADE NEPHROSTOGRAM, DIAGNOSTIC  URETEROSCOPY;  Surgeon: Elisabeth Valli BIRCH, MD;  Location: [facility];  Service: Urology;  Laterality: Left;  90 MINS   HOLMIUM LASER APPLICATION Left 08/11/2022   Procedure: HOLMIUM LASER APPLICATION;  Surgeon: Elisabeth Valli BIRCH, MD;  Location: [facility];  Service: Urology;  Laterality: Left;   IR NEPHROSTOMY EXCHANGE LEFT  05/15/2022   IR NEPHROSTOMY EXCHANGE LEFT  07/10/2022   IR NEPHROSTOMY PLACEMENT LEFT  05/06/2022   KNEE ARTHROSCOPY Left 06/10/2011   LYMPH NODE DISSECTION Left 09/02/2022   Procedure: RETROPERITONEAL LYMPH  NODE DISSECTION;  Surgeon: Alvaro Ricardo, MD;  Location: [facility];  Service: Urology;  Laterality: Left;   PROSTATE BIOPSY  2000   ROBOT ASSITED LAPAROSCOPIC NEPHROURETERECTOMY Left 09/02/2022   Procedure: XI ROBOT ASSITED LAPAROSCOPIC NEPHROURETERECTOMY;  Surgeon: Alvaro Ricardo, MD;  Location: [facility];  Service: Urology;  Laterality: Left;   ROBOT LAP RADICAL CYSTOPROSTATECTOMY PELVIC LYMPHADENECTOMY, NEOBLADDER N/A 02/04/2024   Procedure: ROBOT ASSISTED LAPAROSCOPIC RADICAL CYSTOPROSTATECTOMY;  Surgeon: Alvaro Ricardo KATHEE Mickey., MD;  Location: [facility];  Service: Urology;  Laterality: N/A;  300 MINUTES NEEDED FOR CASE   TOTAL HIP ARTHROPLASTY Right 07/14/2024   Procedure: ARTHROPLASTY, HIP, TOTAL, ANTERIOR APPROACH;  Surgeon: Vernetta Lonni GRADE, MD;  Location: [facility];  Service: Orthopedics;  Laterality: Right;  Needs RNFA   TRANSURETHRAL RESECTION OF BLADDER TUMOR N/A 08/16/2017   Procedure: TRANSURETHRAL RESECTION OF BLADDER TUMOR (TURBT);  Surgeon: Sherrilee Belvie CROME, MD;  Location: [facility];  Service: Urology;  Laterality: N/A;   TRANSURETHRAL RESECTION OF BLADDER TUMOR  12-09-2012   dr lamar sar [facility]   and TURP   TRANSURETHRAL RESECTION  OF BLADDER TUMOR N/A 09/20/2017   Procedure: TRANSURETHRAL RESECTION OF BLADDER TUMOR (TURBT);  Surgeon: Sherrilee Belvie CROME, MD;  Location: [facility];  Service: Urology;  Laterality: N/A;   TRANSURETHRAL RESECTION OF BLADDER TUMOR N/A 12/08/2019   Procedure: TRANSURETHRAL RESECTION OF BLADDER TUMOR;  Surgeon: Sherrilee Belvie CROME, MD;  Location: [facility];  Service: Urology;  Laterality: N/A;  1 HR   TRANSURETHRAL RESECTION OF BLADDER TUMOR N/A 10/29/2020   Procedure: BLADDER FULGERATION;  Surgeon: Elisabeth Valli BIRCH, MD;  Location: [facility];  Service: Urology;  Laterality: N/A;  30 MINS   TRANSURETHRAL RESECTION OF BLADDER TUMOR N/A 07/08/2021   Procedure: TRANSURETHRAL RESECTION OF  BLADDER TUMOR (TURBT)/FULGERATION;  Surgeon: Elisabeth Valli BIRCH, MD;  Location: [facility];  Service: Urology;  Laterality: N/A;  45 MINS   TRANSURETHRAL RESECTION OF BLADDER TUMOR N/A 12/02/2021   Procedure: TRANSURETHRAL RESECTION OF BLADDER TUMOR (TURBT);  Surgeon: Elisabeth Valli BIRCH, MD;  Location: [facility];  Service: Urology;  Laterality: N/A;   TRANSURETHRAL RESECTION OF BLADDER TUMOR N/A 07/21/2023   Procedure: TRANSURETHRAL RESECTION OF BLADDER TUMOR (TURBT);  Surgeon: Alvaro Ricardo KATHEE Mickey., MD;  Location: [facility];  Service: Urology;  Laterality: N/A;  60 MINUTES   TRANSURETHRAL RESECTION OF BLADDER TUMOR N/A 10/01/2023   Procedure: TRANSURETHRAL RESECTION OF BLADDER TUMOR (TURBT)/CLOT EVACUATION WITH FULGERATION;  Surgeon: Alvaro Ricardo KATHEE Mickey., MD;  Location: [facility];  Service: Urology;  Laterality: N/A;   TRANSURETHRAL RESECTION OF PROSTATE  2011   UPPER GI ENDOSCOPY  YRS AGO   URETEROSCOPY Right 02/04/2024   Procedure: RIGHT CUTANEOUS URETEROSCOPY;  Surgeon: Alvaro Ricardo KATHEE Mickey., MD;  Location: [facility];  Service: Urology;  Laterality: Right;    Family History  Problem Relation Age of Onset   Hypertension Mother    Breast cancer Mother    Other Mother        brain tumor   Alzheimer's disease Mother    Emphysema Father        smoker   Social History:  reports that he has never smoked. He has never used smokeless tobacco. He reports that he does not currently use alcohol after a past usage of about 1.0 - 2.0 standard drink of alcohol per week. He reports that he does not use drugs.  Allergies:  Allergies  Allergen Reactions   Macrobid [Nitrofurantoin] Shortness Of Breath   Ciprofloxacin  Diarrhea and Other (See Comments)    Occurred with 750 mg dose. Has since taken 500 mg doses and has had no reaction/diarrhea.   Keytruda  [Pembrolizumab ] Other (See Comments)    Weakness in both legs and fatigue   Cephalexin Rash and Other (See Comments)    Rash on arms; tolerated  Rocephin /cefpodoxime    [name]  [Mitomycin ] Rash and Other (See Comments)    All over body RASH; possibly triggered kidney REMOVAL and RESTLESS LEGS also    No medications prior to admission.    Results for orders placed or performed during the hospital encounter of 09/11/24 (from the past 48 hours)  Comprehensive metabolic panel per protocol     Status: Abnormal   Collection Time: 09/11/24  2:20 PM  Result Value Ref Range   Sodium 141 135 - 145 mmol/L   Potassium 4.5 3.5 - 5.1 mmol/L   Chloride 105 98 - 111 mmol/L   CO2 22 22 - 32 mmol/L   Glucose, Bld 79 70 - 99 mg/dL    Comment: Glucose reference range  applies only to samples taken after fasting for at least 8 hours.   BUN 57 (H) 8 - 23 mg/dL   Creatinine, Ser 5.39 (H) 0.61 - 1.24 mg/dL   Calcium  9.5 8.9 - 10.3 mg/dL   Total Protein 7.1 6.5 - 8.1 g/dL   Albumin  4.1 3.5 - 5.0 g/dL   AST 19 15 - 41 U/L   ALT 11 0 - 44 U/L   Alkaline Phosphatase 80 38 - 126 U/L   Total Bilirubin 0.4 0.0 - 1.2 mg/dL   GFR, Estimated 12 (L) >60 mL/min    Comment: (NOTE) Calculated using the CKD-EPI Creatinine Equation (2021)    Anion gap 14 5 - 15    Comment: Performed at [facility], [address] [place], [place] [address]  CBC per protocol     Status: Abnormal   Collection Time: 09/11/24  2:20 PM  Result Value Ref Range   WBC 7.1 4.0 - 10.5 K/uL   RBC 3.39 (L) 4.22 - 5.81 MIL/uL   Hemoglobin 9.8 (L) 13.0 - 17.0 g/dL   HCT 68.1 (L) 60.9 - 47.9 %   MCV 93.8 80.0 - 100.0 fL   MCH 28.9 26.0 - 34.0 pg   MCHC 30.8 30.0 - 36.0 g/dL   RDW 84.0 (H) 88.4 - 84.4 %   Platelets 309 150 - 400 K/uL   nRBC 0.0 0.0 - 0.2 %    Comment: Performed at [facility], [address] [place], [place] [address]   No results found.  Review of Systems  Constitutional:  Negative for chills and fever.  All other systems reviewed and are negative.   There were no vitals taken for this visit. Physical Exam Vitals  reviewed.  HENT:     Head: Normocephalic.  Eyes:     Pupils: Pupils are equal, round, and reactive to light.  Cardiovascular:     Rate and Rhythm: Normal rate.  Pulmonary:     Effort: Pulmonary effort is normal.  Abdominal:     General: Abdomen is flat.     Comments: RLQ urostomy patent of copious urine.  Genitourinary:    Comments: No CVAT at present Musculoskeletal:        General: Normal range of motion.     Cervical back: Normal range of motion.  Skin:    General: Skin is warm.  Neurological:     General: No focal deficit present.     Mental Status: He is alert.  Psychiatric:        Mood and Affect: Mood normal.      Assessment/Plan  Proceed as planned with Rt ureteral stent / retrograde under MAC with goal of maximal renal decompression / GFR preservation with solitary kidney. RIsks, benefits, alternatives, expected peri-op course discussed previously and reiterated today.   Ricardo KATHEE Alvaro Mickey., MD 09/13/2024, 6:48 AM

## 2024-09-13 NOTE — Transfer of Care (Signed)
 Immediate Anesthesia Transfer of Care Note  Patient: [name] [name]  Procedure(s) Performed: CYSTOSCOPY, FLEXIBLE, WITH STENT REPLACEMENT (Right) CYSTOSCOPY, WITH RETROGRADE PYELOGRAM (Right)  Patient Location: PACU  Anesthesia Type:MAC  Level of Consciousness: sedated  Airway & Oxygen Therapy: Patient Spontanous Breathing and Patient connected to face mask oxygen  Post-op Assessment: Report given to RN and Post -op Vital signs reviewed and stable  Post vital signs: Reviewed and stable  Last Vitals:  Vitals Value Taken Time  BP    Temp    Pulse 78 09/13/24 13:03  Resp 16 09/13/24 13:04  SpO2 100 % 09/13/24 13:03  Vitals shown include unfiled device data.  Last Pain:  Vitals:   09/13/24 1054  TempSrc:   PainSc: 0-No pain         Complications: No notable events documented.

## 2024-09-13 NOTE — Op Note (Signed)
 [name] [name], SPIVACK MEDICAL RECORD NO: [id number] ACCOUNT NO: [id number] DATE OF BIRTH: [birth year] FACILITY: [facility] LOCATION: [facility] PHYSICIAN: Ricardo Likens, MD  Operative Report   DATE OF PROCEDURE: 09/13/2024  SURGEON:  Ricardo Likens, MD  PREOPERATIVE DIAGNOSES: 1.  Solitary right kidney with urinary diversion. 2.  Hydronephrosis.  PROCEDURE PERFORMED: 1.  Right retrograde pyelogram, interpretation. 2.  Placement of right ureteral stent.  ESTIMATED BLOOD LOSS:  Nil.  COMPLICATIONS:  None.  SPECIMENS:  None.  FINDINGS: 1.  Moderate hydronephrosis to distal right ureter consistent with likely partial ureteral obstruction. 2.  Placement of right ureteral stent, proximal end in upper pole, distal end externalized.  INDICATIONS:  The patient is an incredibly pleasant and very vigorous 89 year old man with a history of multifocal urothelial carcinoma status post left nephroureterectomy, cystectomy, and right cutaneous ureterostomy, which he tolerated amazingly well.   He is doing great from an oncologic perspective, but was noted on surveillance to have some worsening kidney function with a creatinine of 4s and some worsening right hydronephrosis of his solitary kidney.  His volume status is good; however, this  overall picture is consistent with questionable partial obstruction of the right ureter, especially where it tunnels through the abdominal wall.  Options were discussed including observation versus the recommended path of right ureteral stent placement  for renal decompression to see if his kidney function improves with stenting, and we agreed to proceed with this.  He returns for this today.  Informed consent was obtained and is placed in the medical record.  DESCRIPTION OF PROCEDURE:  The patient's identity is being verified and the procedure being right ureteral stent placement was confirmed.  Procedure timeout was performed.  Intravenous antibiotics administered.   Monitored anesthesia care, conscious  sedation was delivered.  The patient was placed in a supine position.  A sterile field was created by first removing his in situ urostomy appliance and prepping his right cutaneous urostomy using iodine .  This was patent with nonfoul urine.  The urostomy  was cannulated with a 0.038 ZIPwire, which was advanced *** to the area of the presumed renal pelvis.  Spot fluoroscopic imaging corroborated likely renal pelvis location.  An open-ended catheter was advanced over this and a right retrograde pyelogram  was obtained.  Right retrograde pyelogram demonstrated single right ureter, single system right kidney.  There is some moderate hydronephrosis to distal ureter.  Likely some partial obstruction.  ZIPwire was once again advanced.  The open-ended catheter was exchanged  for a 7 x 24 Contour-type stent.  Proximal end in the renal pelvis and the distal end externalized.  There was efflux of copious urine around the distal end of the stent.  A new ostomy appliance was placed, and the procedure was terminated.  The patient  tolerated the procedure well.  No immediate periprocedural complications.  The patient was taken to the post-anesthesia care unit in stable condition.  Plan for discharge home.   PUS D: 09/13/2024 12:59:01 pm T: 09/13/2024 7:35:00 pm  JOB: [id number]/ [id number]

## 2024-09-13 NOTE — Anesthesia Preprocedure Evaluation (Addendum)
 Anesthesia Evaluation  Patient identified by MRN, date of birth, ID band Patient awake    Reviewed: Allergy & Precautions, NPO status , Patient's Chart, lab work & pertinent test results  History of Anesthesia Complications Negative for: history of anesthetic complications  Airway Mallampati: II  TM Distance: >3 FB Neck ROM: Full    Dental no notable dental hx. (+) Caps, Dental Advisory Given, Teeth Intact   Pulmonary    Pulmonary exam normal breath sounds clear to auscultation       Cardiovascular hypertension, (-) angina (-) Past MI Normal cardiovascular exam Rhythm:Regular Rate:Normal  06/07/2024 TTE 1. Left ventricular ejection fraction, by estimation, is 60 to 65%. The  left ventricle has normal function. The left ventricle has no regional  wall motion abnormalities. Left ventricular diastolic parameters are  consistent with Grade I diastolic  dysfunction (impaired relaxation).   2. Right ventricular systolic function is normal. The right ventricular  size is normal. Tricuspid regurgitation signal is inadequate for assessing  PA pressure.   3. The mitral valve is normal in structure. Trivial mitral valve  regurgitation. No evidence of mitral stenosis.   4. The aortic valve is tricuspid. Aortic valve regurgitation is mild.  Aortic valve sclerosis/calcification is present, without any evidence of  aortic stenosis. Aortic regurgitation PHT measures 841 msec. Aortic valve  area, by VTI measures 2.11 cm.  Aortic valve mean gradient measures 3.0 mmHg. Aortic valve Vmax measures  1.19 m/s.   5. The inferior vena cava is normal in size with greater than 50%  respiratory variability, suggesting right atrial pressure of 3 mmHg.     Neuro/Psych   Anxiety        GI/Hepatic ,GERD  Medicated and Controlled,,  Endo/Other  Hypothyroidism    Renal/GU Renal diseasenoninvasive papillary carcinoma Lab Results      Component                 Value               Date                      NA                       141                 09/11/2024                CL                       105                 09/11/2024                K                        4.5                 09/11/2024                CO2                      22                  09/11/2024                BUN  57 (H)              09/11/2024                CREATININE               4.60 (H)            09/11/2024                GFRNONAA                 12 (L)              09/11/2024                     Musculoskeletal  (+) Arthritis ,    Abdominal   Peds  Hematology  (+) Blood dyscrasia, anemia Lab Results      Component                Value               Date                      WBC                      7.1                 09/11/2024                HGB                      9.8 (L)             09/11/2024                HCT                      31.8 (L)            09/11/2024                MCV                      93.8                09/11/2024                PLT                      309                 09/11/2024              Anesthesia Other Findings All: macrobid, cephalexin, cipro   Reproductive/Obstetrics                              Anesthesia Physical Anesthesia Plan  ASA: 4  Anesthesia Plan: MAC   Post-op Pain Management: Ofirmev  IV (intra-op)*   Induction:   PONV Risk Score and Plan: Ondansetron , Treatment may vary due to age or medical condition and Propofol  infusion  Airway Management Planned: Natural Airway and Nasal Cannula  Additional Equipment: None  Intra-op Plan:   Post-operative Plan:   Informed Consent: I have reviewed the patients History and Physical, chart, labs and discussed the procedure including the risks, benefits and alternatives for the proposed anesthesia  with the patient or authorized representative who has indicated his/her understanding and acceptance.      Dental advisory given  Plan Discussed with: CRNA and Surgeon  Anesthesia Plan Comments:          Anesthesia Quick Evaluation

## 2024-09-14 ENCOUNTER — Encounter (HOSPITAL_COMMUNITY): Payer: Self-pay | Admitting: Urology

## 2024-09-15 ENCOUNTER — Other Ambulatory Visit: Payer: Self-pay | Admitting: Vascular Surgery

## 2024-09-15 DIAGNOSIS — N184 Chronic kidney disease, stage 4 (severe): Secondary | ICD-10-CM

## 2024-09-18 ENCOUNTER — Other Ambulatory Visit: Payer: Self-pay

## 2024-09-18 NOTE — Patient Instructions (Signed)
 Visit Information  Thank you for taking time to visit with me today. Please don't hesitate to contact me if I can be of assistance to you before our next scheduled appointment.  Your next care management appointment is by telephone on Monday, November 24th at 12:30pm   Please call the care guide team at [phone] if you need to cancel, schedule, or reschedule an appointment.   A reminder to ALL patients/family/friends, please call the [place]  National Suicide Prevention Lifeline: 432-303-8661 or TTY: 623 744 7750 TTY 339-822-2005) to talk to a trained counselor if you are experiencing a Mental Health or Behavioral Health Crisis or need someone to talk to.  Santana Stamp BSN, CCM [place]  VBCI Population Health RN Care Manager Direct Dial: [phone]  Fax: [phone]

## 2024-09-18 NOTE — Patient Outreach (Signed)
 Complex Care Management   Visit Note  09/18/2024  Name:  [name] [name] MRN: [id number] DOB: 10-29-1932  Situation: Referral received for Complex Care Management related to Chronic Kidney Disease I obtained verbal consent from Patient.  Visit completed with [name] [name]  on the phone.  Labs on 09/11/24 showed eGFR of 12, he then had a Right Ureteral Stent Placement on 09/[birth year] (his birthday!), to see if this helps improve kidney function.  He is scheduled with [facility]ascular [facility] on 10/13/24 to prepare for Dialysis. Denies fever/chills/pain.   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   BPH (benign prostatic hyperplasia)    CKD (chronic kidney disease), stage III (HCC)    STAGE 3  B CKD per lov 10-16-2020 dr mateo [facility] on chart   Dyspnea    Fatigue    MIDDLE OF DAY ON OCCASION   Frequency of urination    GERD (gastroesophageal reflux disease)    on protonix    Hematuria    History of colon polyps    HLD (hyperlipidemia)    HOH (hard of hearing)    slightly hoh right worse than left- no HAs   HTN (hypertension)    Hypothyroidism    Incontinence of urine    weras pads all the time   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   Right Buccal Cheek   Pre-diabetes    Recurrent bladder papillary carcinoma (HCC) hx 2012 and 2014-- urologist- dr sherrilee   s/p  TURBT 08-16-2017  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   Renal mass 09/02/2022   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 10/02/2023   Sleep apnea    uses cpap set on 20 /4   Trigger finger of left hand 07/04/2021   last few months per pt   UTI (urinary tract infection) 08/20/2020   Weakness of extremity    left knee   Wears glasses      Assessment: Patient Reported Symptoms:  Cognitive Cognitive Status: Alert and oriented to person, place, and time, Insightful and able to interpret abstract concepts, Normal speech and language skills Cognitive/Intellectual Conditions Management [RPT]: None reported or documented in medical history or problem list      Neurological Neurological Review of Symptoms: No symptoms reported    HEENT HEENT Symptoms Reported: No symptoms reported      Cardiovascular Cardiovascular Symptoms Reported: No symptoms reported Does patient have uncontrolled Hypertension?: No    Respiratory Respiratory Symptoms Reported: No symptoms reported    Endocrine Endocrine Symptoms Reported: Not assessed Is patient diabetic?: No    Gastrointestinal Gastrointestinal Symptoms Reported: No symptoms reported      Genitourinary Additional Genitourinary Details: Hx of Bladder cancer, has urostomy.    Integumentary Integumentary Symptoms Reported: No symptoms reported    Musculoskeletal Musculoskelatal Symptoms Reviewed: Unsteady gait Additional Musculoskeletal Details: Uses walker as needed        Psychosocial Psychosocial Symptoms Reported: No symptoms reported          09/18/2024    PHQ2-9 Depression Screening   Little interest or pleasure in doing things    Feeling down, depressed, or hopeless    PHQ-2 - Total Score  Trouble falling or staying asleep, or sleeping too much    Feeling tired or having little energy    Poor appetite or overeating     Feeling bad about yourself - or that you are a failure or have let yourself or your family down    Trouble concentrating on things, such as reading the newspaper or watching television    Moving or speaking so slowly that other people could have noticed.  Or the opposite - being so fidgety or restless that you have been moving around a lot more than usual    Thoughts that you would be better off dead, or hurting yourself in some way    PHQ2-9  Total Score    If you checked off any problems, how difficult have these problems made it for you to do your work, take care of things at home, or get along with other people    Depression Interventions/Treatment      There were no vitals filed for this visit.  Medications Reviewed Today   Medications were not reviewed in this encounter     Recommendation:   Specialty provider follow-up Vascular & Vein 10/13/24 (preparing for dialysis)  Patient is following discharge instructions with diet/hydration, and to call surgeon for any red flag symptoms that may present.   Follow Up Plan:   Telephone follow-up 10/16/24  Santana Stamp BSN, CCM [place]  [facility] RN Care Manager Direct Dial: [phone]  Fax: [phone]

## 2024-09-20 ENCOUNTER — Ambulatory Visit (INDEPENDENT_AMBULATORY_CARE_PROVIDER_SITE_OTHER): Admitting: Physician Assistant

## 2024-09-20 ENCOUNTER — Encounter: Payer: Self-pay | Admitting: Physician Assistant

## 2024-09-20 ENCOUNTER — Other Ambulatory Visit: Payer: Self-pay

## 2024-09-20 DIAGNOSIS — M25561 Pain in right knee: Secondary | ICD-10-CM

## 2024-09-20 DIAGNOSIS — M1711 Unilateral primary osteoarthritis, right knee: Secondary | ICD-10-CM | POA: Diagnosis not present

## 2024-09-20 MED ORDER — LIDOCAINE HCL 2 % IJ SOLN
5.0000 mL | INTRAMUSCULAR | Status: AC | PRN
  Administered 2024-09-20: 5 mL

## 2024-09-20 MED ORDER — LIDOCAINE HCL 1 % IJ SOLN
3.0000 mL | INTRAMUSCULAR | Status: AC | PRN
  Administered 2024-09-20: 3 mL

## 2024-09-20 MED ORDER — METHYLPREDNISOLONE ACETATE 40 MG/ML IJ SUSP
40.0000 mg | INTRAMUSCULAR | Status: AC | PRN
  Administered 2024-09-20: 40 mg via INTRA_ARTICULAR

## 2024-09-20 NOTE — Progress Notes (Signed)
 HPI: Patient returns today status post right total hip arthroplasty 07/14/2024.  States his hip is overall doing well.  His main concern certain today is his right knee which he is having pain medial aspect.  He states both knees became stiff last week and he had increase pain with mobility.  He ranks his pain in the right knee down to be 5 out of 10 the left knee is overall doing well.  He has been taking Tylenol .  Denies any fevers chills.  No known injury to the right knee.  He is nondiabetic.  Has been taking Tylenol  for the knee pain  Review of systems: See HPI otherwise negative  Physical exam: General Well-developed well-nourished male no acute distress ambulates with antalgic gait with the use of a walker. Respirations: Nonlabored Psych: Alert and oriented x 3 Bilateral hips: Good range of motion without pain. Bilateral knees: No abnormal warmth erythema of either knee.  Positive edema plus minus effusion.  Right knee left knee no effusion.  Bilateral knees good range of motion overall.  Tenderness right knee medial joint line.  Radiographs: AP lateral views right knee: Knee is well located.  Moderate to moderately severe narrowing medial joint line.  Lateral joint line overall well-preserved.  Mild to moderate changes patellofemoral joint.  No acute findings or fractures.  Impression: Status post right total hip arthroplasty 07/14/2024 Right knee arthritis  Plan: He will continue to work on range of motion and anticoagulation for his hip.  In regards to the knee recommend left hip aspiration and injection he was agreeable with this today.  He will follow-up with us  at 6 months postop we will obtain an AP pelvis and a lateral view of the right hip at that time.  Questions were encouraged and answered he will follow-up with us  sooner if there is any questions or concerns.  Discussed with him knee friendly exercises and on strengthening.     Procedure Note  Patient: [name] [name]              Date of Birth: [birth year]           MRN: [id number]             Visit Date: 09/20/2024  Procedures: Visit Diagnoses:  1. Acute pain of right knee   2. Primary osteoarthritis of right knee     Large Joint Inj: R knee on 09/20/2024 11:05 AM Indications: pain Details: 22 G 1.5 in needle, superolateral approach  Arthrogram: No  Medications: 3 mL lidocaine  1 %; 40 mg methylPREDNISolone acetate 40 MG/ML; 5 mL lidocaine  2 % Aspirate: 0 mL Outcome: tolerated well, no immediate complications Procedure, treatment alternatives, risks and benefits explained, specific risks discussed. Consent was given by the patient. Immediately prior to procedure a time out was called to verify the correct patient, procedure, equipment, support staff and site/side marked as required. Patient was prepped and draped in the usual sterile fashion.

## 2024-09-25 ENCOUNTER — Encounter: Payer: Self-pay | Admitting: Radiology

## 2024-10-10 ENCOUNTER — Other Ambulatory Visit: Payer: Self-pay

## 2024-10-10 ENCOUNTER — Encounter (HOSPITAL_COMMUNITY): Payer: Self-pay

## 2024-10-10 ENCOUNTER — Emergency Department (HOSPITAL_COMMUNITY)

## 2024-10-10 ENCOUNTER — Emergency Department (HOSPITAL_COMMUNITY): Admission: EM | Admit: 2024-10-10 | Discharge: 2024-10-10 | Disposition: A | Discharge status: Home / Self Care

## 2024-10-10 DIAGNOSIS — W07XXXA Fall from chair, initial encounter: Secondary | ICD-10-CM | POA: Diagnosis not present

## 2024-10-10 DIAGNOSIS — Z79899 Other long term (current) drug therapy: Secondary | ICD-10-CM | POA: Diagnosis not present

## 2024-10-10 DIAGNOSIS — S20219A Contusion of unspecified front wall of thorax, initial encounter: Secondary | ICD-10-CM | POA: Insufficient documentation

## 2024-10-10 DIAGNOSIS — W19XXXA Unspecified fall, initial encounter: Secondary | ICD-10-CM

## 2024-10-10 DIAGNOSIS — S299XXA Unspecified injury of thorax, initial encounter: Secondary | ICD-10-CM | POA: Diagnosis present

## 2024-10-10 DIAGNOSIS — S298XXA Other specified injuries of thorax, initial encounter: Secondary | ICD-10-CM

## 2024-10-10 MED ORDER — OXYCODONE HCL 5 MG PO TABS: 5.0000 mg | ORAL_TABLET | ORAL | 0 refills | Status: AC | PRN

## 2024-10-10 MED ORDER — LIDOCAINE 5 % EX PTCH
1.0000 | MEDICATED_PATCH | CUTANEOUS | Status: DC
  Administered 2024-10-10: 1 via TRANSDERMAL
  Filled 2024-10-10: qty 1

## 2024-10-10 MED ORDER — OXYCODONE-ACETAMINOPHEN 5-325 MG PO TABS
2.0000 | ORAL_TABLET | Freq: Once | ORAL | Status: AC
  Administered 2024-10-10: 2 via ORAL
  Filled 2024-10-10: qty 2

## 2024-10-10 MED ORDER — LIDOCAINE 5 % EX PTCH: 1.0000 | MEDICATED_PATCH | CUTANEOUS | 0 refills | Status: AC

## 2024-10-10 NOTE — ED Triage Notes (Signed)
 Pt bib EMS for fall. Pt says he reached for dog and fell. Pt fell into arm of chair. Pt reports waiting an hour before calling EMS. Reports click noise when he breathes.L sided pain reported. Unable to take a deep breath. Reporting a little dizziness. Pt has ostomy bag.No LOC or blood thinners. Decreased lung sounds reported and crepitus by EMS.  BP 176/98 HR 97 RA 100% RR 26

## 2024-10-10 NOTE — Discharge Instructions (Addendum)
 As we discussed, no evidence of rib fracture.  I have sent some lidocaine  patches to your pharmacy and some oxycodone  for breakthrough pain.  You can take either Tylenol  or ibuprofen for normal pain.  Please follow-up with your primary care doctor for further evaluation.  You may return to the emergency department for any worsening symptoms.

## 2024-10-10 NOTE — ED Provider Notes (Signed)
 [name] [name]   CSN: [id number] Arrival date & time: 10/10/24  8095     Patient presents with: Rib Injury and Fall   [name] [name] is a 89 y.o. male patient who presents to the emergency department today for further evaluation of a mechanical trip and fall that occurred just prior to arrival.  Patient was walking when he lost his balance falling forward striking the left anterior chest wall along the armrest of his recliner.  Patient did not hit his head or lose consciousness.  Pain is worse with certain movements and with deep breath.    Fall       Prior to Admission medications   Medication Sig Start Date End Date Taking? Authorizing Provider  lidocaine  (LIDODERM ) 5 % Place 1 patch onto the skin daily. Remove & Discard patch within 12 hours or as directed by MD 10/10/24  Yes Theotis, Breiona Couvillon M, PA-C  oxyCODONE  (ROXICODONE ) 5 MG immediate release tablet Take 1 tablet (5 mg total) by mouth every 4 (four) hours as needed for severe pain (pain score 7-10). 10/10/24  Yes Theotis, Mirinda Monte M, PA-C  acetaminophen  (TYLENOL ) 500 MG tablet Take 1,000 mg by mouth every 6 (six) hours as needed for moderate pain (pain score 4-6).    [provider]  amLODipine  (NORVASC ) 10 MG tablet Take 1 tablet (10 mg total) by mouth daily. Patient taking differently: Take 10 mg by mouth every evening. 08/23/20   Gonfa, Taye T, MD  ferrous sulfate  325 (65 FE) MG tablet Take 325 mg by mouth daily with supper.    [provider]  levothyroxine  (SYNTHROID , LEVOTHROID) 50 MCG tablet Take 50 mcg by mouth daily before breakfast. 12/25/15   [provider]  pantoprazole  (PROTONIX ) 20 MG tablet Take 20 mg by mouth at bedtime. 12/20/15   [provider]  rosuvastatin  (CRESTOR ) 5 MG tablet Take 5 mg by mouth at bedtime.    [provider]  senna-docusate (SENOKOT-S) 8.6-50 MG tablet Take 1 tablet by mouth 2 (two) times  daily. While taking strong pain meds to prevent constipation 09/04/22   Manny, Ricardo KATHEE Raddle., MD  sodium bicarbonate  650 MG tablet Take 1,300 mg by mouth 2 (two) times daily.    [provider]    Allergies: Macrobid [nitrofurantoin], Ciprofloxacin , Keytruda  [pembrolizumab ], Cephalexin, and [name]  [mitomycin ]    Review of Systems  All other systems reviewed and are negative.   Updated Vital Signs BP (!) 156/78   Pulse (!) 103   Temp (!) 97.5 F (36.4 C) (Oral)   Resp 20   Ht 5' 8 (1.727 m)   Wt 70 kg   SpO2 99%   BMI 23.46 kg/m   Physical Exam Vitals and nursing [name] reviewed.  Constitutional:      General: He is not in acute distress.    Appearance: Normal appearance.  HENT:     Head: Normocephalic and atraumatic.  Eyes:     General:        Right eye: No discharge.        Left eye: No discharge.  Cardiovascular:     Comments: Regular rate and rhythm.  S1/S2 are distinct without any evidence of murmur, rubs, or gallops.  Radial pulses are 2+ bilaterally.  Dorsalis pedis pulses are 2+ bilaterally.  No evidence of pedal edema. Pulmonary:     Comments: Clear to auscultation bilaterally.  Normal effort.  No respiratory distress.  No evidence of  wheezes, rales, or rhonchi heard throughout. Chest:       Comments: No bruising or obvious step-offs. Abdominal:     General: Abdomen is flat. Bowel sounds are normal. There is no distension.     Tenderness: There is no abdominal tenderness. There is no guarding or rebound.  Musculoskeletal:        General: Normal range of motion.     Cervical back: Neck supple.  Skin:    General: Skin is warm and dry.     Findings: No rash.  Neurological:     General: No focal deficit present.     Mental Status: He is alert.  Psychiatric:        Mood and Affect: Mood normal.        Behavior: Behavior normal.     (all labs ordered are listed, but only abnormal results are displayed) Labs Reviewed - No data to  display  EKG: None  Radiology: [facility] Chest Port 1 View Result Date: 10/10/2024 CLINICAL DATA:  Shortness of breath EXAM: PORTABLE CHEST 1 VIEW COMPARISON:  Chest x-ray 03/01/2024 FINDINGS: The heart size and mediastinal contours are within normal limits. Both lungs are clear. The visualized skeletal structures are unremarkable. IMPRESSION: No active disease. Electronically Signed   By: Greig Pique M.D.   On: 10/10/2024 20:30     Procedures   Medications Ordered in the ED  lidocaine  (LIDODERM ) 5 % 1 patch (1 patch Transdermal Patch Applied 10/10/24 1959)  oxyCODONE -acetaminophen  (PERCOCET/ROXICET) 5-325 MG per tablet 2 tablet (2 tablets Oral Given 10/10/24 1959)    Clinical Course as of 10/10/24 2120  Tue Oct 10, 2024  2057 DG Chest Cook 1 View No evidence of fracture.  I do agree with radiologist interpretation. [CF]    Clinical Course User Index [CF] Theotis Cameron HERO, PA-C    Medical Decision Making [name] [name] is a 89 y.o. male patient who presents to the emergency department today for further evaluation of mechanical trip and fall with left anterior chest wall pain.  Imaging was ordered in triage interpreted by myself.  No evidence of fracture.  Will treat as rib contusion.  Will provide him Roxicodone  for breakthrough pain.  Will give him some lidocaine  patches.  Patient is feeling better here.  We discussed the etiologies of rib contusions and overall management.  I will have him follow-up with his primary care doctor for further evaluation.  He is safe for discharge at this time.    Amount and/or Complexity of Data Reviewed Radiology:  Decision-making details documented in ED Course.  Risk Prescription drug management.     Final diagnoses:  Fall, initial encounter  Contusion of rib, initial encounter    ED Discharge Orders          Ordered    lidocaine  (LIDODERM ) 5 %  Every 24 hours        10/10/24 2116    oxyCODONE  (ROXICODONE ) 5 MG immediate release  tablet  Every 4 hours PRN        10/10/24 2116               Theotis Cameron HERO, PA-C 10/10/24 2120    Gennaro Bouchard L, DO 10/12/24 1725

## 2024-10-13 ENCOUNTER — Ambulatory Visit (HOSPITAL_COMMUNITY)

## 2024-10-13 ENCOUNTER — Ambulatory Visit: Admitting: Vascular Surgery

## 2024-10-16 ENCOUNTER — Other Ambulatory Visit: Payer: Self-pay

## 2024-10-16 NOTE — Patient Instructions (Signed)
 Visit Information  Thank you for taking time to visit with me today. Please don't hesitate to contact me if I can be of assistance to you before our next scheduled appointment.  Your next care management appointment is by telephone on Monday, December 22 at 1:00pm.    Please call the care guide team at [phone] if you need to cancel, schedule, or reschedule an appointment.   Please call the [place]  National Suicide Prevention Lifeline: 814-548-6890 or TTY: 215-855-6987 TTY [phone]) to talk to a trained counselor if you are experiencing a Mental Health or Behavioral Health Crisis or need someone to talk to.  Santana Stamp BSN, CCM [place]  [facility] RN Care Manager Direct Dial: [phone]  Fax: [phone]

## 2024-10-16 NOTE — Patient Outreach (Signed)
 Complex Care Management   Visit Note  10/16/2024  Name:  [name] [name] MRN: [id number] DOB: [birth year]  Situation: Referral received for Complex Care Management related to Chronic Kidney Disease I obtained verbal consent from Patient.  Visit completed with [name] [name]  on the phone.  Reports a fall on 10/10/24 with ED visit, no fractures.    Background:   Urostomy to drain right kidney, being seen by Nephrology, stent placed in right kidney, will take a class on preparing for dialysis in case right kidney gets worse.  Past Medical History:  Diagnosis Date   Anemia    Arthritis    lower back bulging disc, hips, knees, thumbs, shoulder   Asthma    as a pre teen   Bladder cancer (HCC) UROLOGIST-  DR MCKENZIE   S/P TURBT 08-16-2017   Bladder wall hemorrhage 09/28/2023   BPH (benign prostatic hyperplasia)    CKD (chronic kidney disease), stage III (HCC)    STAGE 3  B CKD per lov 10-16-2020 dr mateo [facility] on chart   Dyspnea    Fatigue    MIDDLE OF DAY ON OCCASION   Frequency of urination    GERD (gastroesophageal reflux disease)    on protonix    Hematuria    History of colon polyps    HLD (hyperlipidemia)    HOH (hard of hearing)    slightly hoh right worse than left- no HAs   HTN (hypertension)    Hypothyroidism    Incontinence of urine    weras pads all the time   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   Right Buccal Cheek   Pre-diabetes    Recurrent bladder papillary carcinoma (HCC) hx 2012 and 2014-- urologist- dr sherrilee   s/p  TURBT 08-16-2017  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   Renal mass 09/02/2022   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 10/02/2023   Sleep apnea    uses cpap set on 20 /4   Trigger finger of left hand 07/04/2021   last few months per pt   UTI (urinary tract infection) 08/20/2020   Weakness of extremity    left knee   Wears glasses     Assessment: Patient Reported  Symptoms:  Cognitive Cognitive Status: Alert and oriented to person, place, and time, Insightful and able to interpret abstract concepts, Normal speech and language skills      Neurological Neurological Review of Symptoms: No symptoms reported    HEENT HEENT Symptoms Reported: No symptoms reported      Cardiovascular Cardiovascular Symptoms Reported: No symptoms reported    Respiratory Respiratory Symptoms Reported: Other: Other Respiratory Symptoms: Patient had a fall 10/10/24 hitting his rib case on anterior right, intermittent pain is making it difficult for deep breathing.  He is using incentive spirometer 3-4 times a day as ordered by ED MD. [place] to report worsening shortness of breath, worsening pain.    Endocrine Endocrine Symptoms Reported: Not assessed Is patient diabetic?: No    Gastrointestinal Gastrointestinal Symptoms Reported: No symptoms reported      Genitourinary Genitourinary Symptoms Reported: Other Additional Genitourinary Details: hx of Bladder cancer, has urostomy for right kidney, left kidney absent. Denies symptoms. Genitourinary Management Strategies: Urostomy  Integumentary Integumentary Symptoms Reported: No symptoms reported    Musculoskeletal Musculoskelatal Symptoms Reviewed: Unsteady gait Musculoskeletal Management Strategies: Routine screening, Exercise Musculoskeletal Comment: [place] 10/10/24, landed on arm of a chair and hit anterior right side of rib case, ED visit,  no fractures to ribs, sent home with script for lidocaine  patch and oxycodone , only using lidocaine  patch and taking Tylenol  for pain. Uses walker as much as possible. Falls in the past year?: Yes Patient at Risk for Falls Due to: Impaired balance/gait Fall risk Follow up: Education provided, Falls prevention discussed  Psychosocial Psychosocial Symptoms Reported: No symptoms reported          10/16/2024    PHQ2-9 Depression Screening   Little interest or pleasure in doing things     Feeling down, depressed, or hopeless    PHQ-2 - Total Score    Trouble falling or staying asleep, or sleeping too much    Feeling tired or having little energy    Poor appetite or overeating     Feeling bad about yourself - or that you are a failure or have let yourself or your family down    Trouble concentrating on things, such as reading the newspaper or watching television    Moving or speaking so slowly that other people could have noticed.  Or the opposite - being so fidgety or restless that you have been moving around a lot more than usual    Thoughts that you would be better off dead, or hurting yourself in some way    PHQ2-9 Total Score    If you checked off any problems, how difficult have these problems made it for you to do your work, take care of things at home, or get along with other people    Depression Interventions/Treatment      There were no vitals filed for this visit. Pain Scale: 0-10 Pain Score: 7  (States pain is improving since 10/10/24 fall where he hit his rib cage on the arm of a chair.) Pain Type: Acute pain Pain Location: Rib cage Pain Orientation: Right Pain Descriptors / Indicators: Sharp Pain Onset: With Activity (When he burps, has sudden active movements such as getting out of bed.) Pain Intervention(s): Medication (See eMAR)  MEDICATIONS: Denies problems with refills, no med concerns.    Recommendation:   Specialty provider follow-up : Nephrology December 2025; Cardiology 11/06/24: Arterial Duplex and Vein Mapping 11/10/24 Continue Current Plan of Care  Follow Up Plan:   Telephone follow-up in 1 month  Santana Stamp BSN, CCM [place]  [facility] RN Care Manager Direct Dial: [phone]  Fax: [phone]

## 2024-11-06 ENCOUNTER — Encounter (HOSPITAL_BASED_OUTPATIENT_CLINIC_OR_DEPARTMENT_OTHER): Payer: Self-pay | Admitting: Cardiology

## 2024-11-06 ENCOUNTER — Ambulatory Visit (INDEPENDENT_AMBULATORY_CARE_PROVIDER_SITE_OTHER): Admitting: Cardiology

## 2024-11-06 VITALS — BP 136/68 | HR 86 | Ht 68.0 in | Wt 166.3 lb

## 2024-11-06 DIAGNOSIS — N184 Chronic kidney disease, stage 4 (severe): Secondary | ICD-10-CM | POA: Diagnosis not present

## 2024-11-06 DIAGNOSIS — I351 Nonrheumatic aortic (valve) insufficiency: Secondary | ICD-10-CM

## 2024-11-06 DIAGNOSIS — I10 Essential (primary) hypertension: Secondary | ICD-10-CM

## 2024-11-06 DIAGNOSIS — G4733 Obstructive sleep apnea (adult) (pediatric): Secondary | ICD-10-CM | POA: Diagnosis not present

## 2024-11-06 DIAGNOSIS — E785 Hyperlipidemia, unspecified: Secondary | ICD-10-CM

## 2024-11-06 NOTE — Patient Instructions (Signed)
 Medication Instructions:  No changes *If you need a refill on your cardiac medications before your next appointment, please call your pharmacy*  Lab Work: none  Testing/Procedures: none  Follow-Up: At [facility], you and your health needs are our priority.  As part of our continuing mission to provide you with exceptional heart care, our providers are all part of one team.  This team includes your primary Cardiologist (physician) and Advanced Practice Providers or APPs (Physician Assistants and Nurse Practitioners) who all work together to provide you with the care you need, when you need it.  Your next appointment:   12 year(s)  Provider:   Shelda Bruckner, MD

## 2024-11-06 NOTE — Progress Notes (Signed)
°  Cardiology Office Note:  .    Date:  11/06/2024  ID:  [name], DOB February 23, 1932, MRN [id number] PCP: Verdia Lombard, MD  [facility] Providers Cardiologist:  Shelda Bruckner, MD     History of Present Illness: .    [name] [name] is a 89 y.o. male with a hx of sleep apnea on BiPAP, bladder cancer, prostate cancer s/p cystoprostatectomy, L nephrectomy, who is seen for follow up today. I initially met him 04/10/20  as a new consult at the request of Verdia Lombard, MD for the evaluation and management of lightheadedness and palpitations.  Today: Reviewed his recent medical issues. Heart overall is doing well. Does feel tired after walking a distance, but no chest pain, no more shortness of breath than what he would expect. No recent palpations. Blood pressure has been fluctuating at home, ranging 110s-130s systolic. LE edema has completely resolved.  Lost a lot of weight with his medical issues, lowest was 140 lbs from around 200 lbs. Now steady at around 160 lbs.   Reviewed echo from 05/2024. Mild AI, discussed that I do not anticipate this causing him any problems.  ROS:  Denies chest pain, shortness of breath at rest or with normal exertion. No PND, orthopnea, LE edema or unexpected weight gain. No syncope or palpitations. ROS otherwise negative except as noted.   Studies Reviewed: SABRA         Physical Exam:    VS:  BP 136/68   Pulse 86   Ht 5' 8 (1.727 m)   Wt 166 lb 4.8 oz (75.4 kg)   SpO2 91%   BMI 25.29 kg/m    Wt Readings from Last 3 Encounters:  11/06/24 166 lb 4.8 oz (75.4 kg)  10/10/24 154 lb 5.2 oz (70 kg)  09/13/24 154 lb 5.2 oz (70 kg)    GEN: Well nourished, well developed in no acute distress HEENT: Normal, moist mucous membranes NECK: No JVD CARDIAC: regular rhythm, normal S1 and S2, no rubs or gallops. No murmur. VASCULAR: Radial and DP pulses 2+ bilaterally. No carotid bruits RESPIRATORY:  Clear to auscultation  without rales, wheezing or rhonchi  ABDOMEN: Soft, non-tender, non-distended MUSCULOSKELETAL:  Ambulates independently SKIN: Warm and dry, no edema NEUROLOGIC:  Alert and oriented x 3. No focal neuro deficits noted. PSYCHIATRIC:  Normal affect   ASSESSMENT AND PLAN: .    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 Alvaro and Dr. Tina -follows with nephrology, may need dialysis in the future per patient -reviewed red flag signs that are concerning for heart issues   Hyperlipidemia, unspecified: -continue rosuvastatin , on 5 mg dose, with renal function would not increase   Hypertension:  -continue amlodipine , limited options given renal function   Sleep apnea: continue treatment  Mild AI: do not anticipate this will cause him any clinical issues, does not need routine monitoring, recheck for future symptoms as needed  Dispo: Follow-up in 1 year, or sooner as needed.  Signed, Shelda Bruckner, MD

## 2024-11-08 NOTE — Progress Notes (Unsigned)
 Patient ID: [name] [name], male   DOB: Jul 09, 1932, 89 y.o.   MRN: [id number]  Reason for Consult: No chief complaint on file.   Referred by Verdia Lombard, MD  Subjective:     HPI [name] [name] is a 89 y.o. male who presents for evaluation HD access creation.  Past Medical History: No date: Anemia No date: Arthritis     Comment:  lower back bulging disc, hips, knees, thumbs, shoulder No date: Asthma     Comment:  as a pre teen UROLOGIST-  DR MCKENZIE: Bladder cancer (HCC)     Comment:  S/P TURBT 08-16-2017 09/28/2023: Bladder wall hemorrhage No date: BPH (benign prostatic hyperplasia) No date: CKD (chronic kidney disease), stage III (HCC)     Comment:  STAGE 3  B CKD per lov 10-16-2020 dr mateo d[facility] on chart No date: Dyspnea No date: Fatigue     Comment:  MIDDLE OF DAY ON OCCASION No date: Frequency of urination No date: GERD (gastroesophageal reflux disease)     Comment:  on protonix  No date: Hematuria No date: History of colon polyps No date: HLD (hyperlipidemia) No date: HOH (hard of hearing)     Comment:  slightly hoh right worse than left- no HAs No date: HTN (hypertension) No date: Hypothyroidism No date: Incontinence of urine     Comment:  weras pads all the time 03/26/2021: Pigmented basal cell carcinoma (BCC)     Comment:  Right Buccal Cheek No date: Pre-diabetes hx 2012 and 2014-- urologist- dr sherrilee: Recurrent bladder  papillary carcinoma (HCC)     Comment:  s/p  TURBT 08-16-2017  per path High Grade Papillary               Urothelial carcinoma non-invasive 09/02/2022: Renal mass 10/02/2023: 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 No date: Sleep apnea     Comment:  uses cpap set on 20 /4 07/04/2021: Trigger finger of left hand     Comment:  last few months per pt 08/20/2020: UTI (urinary tract infection) No date: Weakness of extremity     Comment:  left knee No date: Wears glasses  Family  History  Problem Relation Age of Onset   Hypertension Mother    Breast cancer Mother    Other Mother        brain tumor   Alzheimer's disease Mother    Emphysema Father        smoker   Past Surgical History:  Procedure Laterality Date   BCG X 2     AFTER LAST 2 BLADDER TUMOR REMOVALS   CATARACT EXTRACTION, BILATERAL     COLONSCOPY  YRS AGO   CYSTOSCOPY N/A 10/29/2020   Procedure: CYSTOSCOPY;  Surgeon: Elisabeth Valli BIRCH, MD;  Location: [facility];  Service: Urology;  Laterality: N/A;   CYSTOSCOPY N/A 07/08/2021   Procedure: CYSTOSCOPY;  Surgeon: Elisabeth Valli BIRCH, MD;  Location: [facility];  Service: Urology;  Laterality: N/A;   CYSTOSCOPY W/ RETROGRADES Right 07/21/2023   Procedure: CYSTOSCOPY WITH RIGHT RETROGRADE PYELOGRAM;  Surgeon: Alvaro Ricardo KATHEE Mickey., MD;  Location: [facility];  Service: Urology;  Laterality: Right;   CYSTOSCOPY W/ RETROGRADES Right 09/13/2024   Procedure: CYSTOSCOPY, WITH RETROGRADE PYELOGRAM;  Surgeon: Alvaro Ricardo KATHEE Mickey., MD;  Location: [facility];  Service: Urology;  Laterality: Right;   CYSTOSCOPY W/ URETERAL STENT PLACEMENT Bilateral 08/16/2017  Procedure: CYSTOSCOPY WITH RETROGRADE PYELOGRAM/URETERAL STENT PLACEMENT;  Surgeon: Sherrilee Belvie CROME, MD;  Location[facility];  Service: Urology;  Laterality: Bilateral;   CYSTOSCOPY W/ URETERAL STENT PLACEMENT Left 09/02/2022   Procedure: CYSTOSCOPY WITH RETROGRADE PYELOGRAM, URETERAL STENT PLACEMENT;  Surgeon: Alvaro Hummer, MD;  Location: [facility];  Service: Urology;  Laterality: Left;   CYSTOSCOPY W/ URETERAL STENT PLACEMENT Right 09/13/2024   Procedure: STENT REPLACEMENT;  Surgeon: Alvaro Hummer KATHEE Mickey., MD;  Location: [facility];  Service: Urology;  Laterality: Right;   CYSTOSCOPY WITH BIOPSY N/A 06/25/2020   Procedure: CYSTOSCOPY FULGURATION   BLADDER  BIOPSY;  Surgeon: Elisabeth Valli BIRCH, MD;  Location[facility];  Service: Urology;  Laterality:  N/A;   CYSTOSCOPY WITH FULGERATION N/A 12/08/2019   Procedure: CYSTOSCOPY WITH FULGERATION;  Surgeon: Sherrilee Belvie CROME, MD;  Location: [facility];  Service: Urology;  Laterality: N/A;   CYSTOSCOPY WITH FULGERATION N/A 03/24/2022   Procedure: CYSTOSCOPY WITH FULGERATION;  Surgeon: Elisabeth Valli BIRCH, MD;  Location: [facility];  Service: Urology;  Laterality: N/A;   CYSTOSCOPY WITH RETROGRADE PYELOGRAM, URETEROSCOPY AND STENT PLACEMENT Left 03/24/2022   Procedure: CYSTOSCOPY WITH RETROGRADE PYELOGRAM, DIAGNOSTIC URETEROSCOPY WITH BIOPSY AND STENT PLACEMENT;  Surgeon: Elisabeth Valli BIRCH, MD;  Location: [facility];  Service: Urology;  Laterality: Left;  90 MINUTES   CYSTOSCOPY WITH RETROGRADE PYELOGRAM, URETEROSCOPY AND STENT PLACEMENT Left 04/07/2022   Procedure: CYSTOSCOPY WITH  RETROGRADE PYELOGRAM, DIAGNOSTIC URETEROSCOPY WITH BIOPSY AND STENT EXCHANGE;  Surgeon: Elisabeth Valli BIRCH, MD;  Location: [facility];  Service: Urology;  Laterality: Left;  75 MINUTES   CYSTOSCOPY WITH RETROGRADE PYELOGRAM, URETEROSCOPY AND STENT PLACEMENT Left 08/11/2022   Procedure: CYSTOSCOPY WITH RETROGRADE PYELOGRAM, ANTEGRADE NEPHROSTOGRAM, DIAGNOSTIC  URETEROSCOPY;  Surgeon: Elisabeth Valli BIRCH, MD;  Location: [facility];  Service: Urology;  Laterality: Left;  90 MINS   HOLMIUM LASER APPLICATION Left 08/11/2022   Procedure: HOLMIUM LASER APPLICATION;  Surgeon: Elisabeth Valli BIRCH, MD;  Location: [facility];  Service: Urology;  Laterality: Left;   IR NEPHROSTOMY EXCHANGE LEFT  05/15/2022   IR NEPHROSTOMY EXCHANGE LEFT  07/10/2022   IR NEPHROSTOMY PLACEMENT LEFT  05/06/2022   KNEE ARTHROSCOPY Left 06/10/2011   LYMPH NODE DISSECTION Left 09/02/2022   Procedure: RETROPERITONEAL LYMPH NODE DISSECTION;  Surgeon: Alvaro Hummer, MD;  Location: [facility];  Service: Urology;  Laterality: Left;   PROSTATE BIOPSY  2000   ROBOT ASSITED LAPAROSCOPIC NEPHROURETERECTOMY Left 09/02/2022   Procedure: XI ROBOT ASSITED LAPAROSCOPIC NEPHROURETERECTOMY;   Surgeon: Alvaro Hummer, MD;  Location: [facility];  Service: Urology;  Laterality: Left;   ROBOT LAP RADICAL CYSTOPROSTATECTOMY PELVIC LYMPHADENECTOMY, NEOBLADDER N/A 02/04/2024   Procedure: ROBOT ASSISTED LAPAROSCOPIC RADICAL CYSTOPROSTATECTOMY;  Surgeon: Alvaro Hummer KATHEE Mickey., MD;  Location: [facility];  Service: Urology;  Laterality: N/A;  300 MINUTES NEEDED FOR CASE   TOTAL HIP ARTHROPLASTY Right 07/14/2024   Procedure: ARTHROPLASTY, HIP, TOTAL, ANTERIOR APPROACH;  Surgeon: Vernetta Lonni GRADE, MD;  Location: [facility];  Service: Orthopedics;  Laterality: Right;  Needs RNFA   TRANSURETHRAL RESECTION OF BLADDER TUMOR N/A 08/16/2017   Procedure: TRANSURETHRAL RESECTION OF BLADDER TUMOR (TURBT);  Surgeon: Sherrilee Belvie CROME, MD;  Location: [facility];  Service: Urology;  Laterality: N/A;   TRANSURETHRAL RESECTION OF BLADDER TUMOR  12-09-2012   dr lamar sar [facility]   and TURP   TRANSURETHRAL RESECTION OF BLADDER TUMOR N/A 09/20/2017   Procedure: TRANSURETHRAL RESECTION OF BLADDER TUMOR (TURBT);  Surgeon: Sherrilee Belvie CROME,  MD;  Location: [facility];  Service: Urology;  Laterality: N/A;   TRANSURETHRAL RESECTION OF BLADDER TUMOR N/A 12/08/2019   Procedure: TRANSURETHRAL RESECTION OF BLADDER TUMOR;  Surgeon: Sherrilee Belvie CROME, MD;  Location: [facility];  Service: Urology;  Laterality: N/A;  1 HR   TRANSURETHRAL RESECTION OF BLADDER TUMOR N/A 10/29/2020   Procedure: BLADDER FULGERATION;  Surgeon: Elisabeth Valli BIRCH, MD;  Location: [facility];  Service: Urology;  Laterality: N/A;  30 MINS   TRANSURETHRAL RESECTION OF BLADDER TUMOR N/A 07/08/2021   Procedure: TRANSURETHRAL RESECTION OF BLADDER TUMOR (TURBT)/FULGERATION;  Surgeon: Elisabeth Valli BIRCH, MD;  Location[facility];  Service: Urology;  Laterality: N/A;  45 MINS   TRANSURETHRAL RESECTION OF BLADDER TUMOR N/A 12/02/2021   Procedure: TRANSURETHRAL RESECTION OF BLADDER  TUMOR (TURBT);  Surgeon: Elisabeth Valli BIRCH, MD;  Location: [facility];  Service: Urology;  Laterality: N/A;   TRANSURETHRAL RESECTION OF BLADDER TUMOR N/A 07/21/2023   Procedure: TRANSURETHRAL RESECTION OF BLADDER TUMOR (TURBT);  Surgeon: Alvaro Ricardo KATHEE Mickey., MD;  Location: [facility];  Service: Urology;  Laterality: N/A;  60 MINUTES   TRANSURETHRAL RESECTION OF BLADDER TUMOR N/A 10/01/2023   Procedure: TRANSURETHRAL RESECTION OF BLADDER TUMOR (TURBT)/CLOT EVACUATION WITH FULGERATION;  Surgeon: Alvaro Ricardo KATHEE Mickey., MD;  Location: [facility];  Service: Urology;  Laterality: N/A;   TRANSURETHRAL RESECTION OF PROSTATE  2011   UPPER GI ENDOSCOPY  YRS AGO   URETEROSCOPY Right 02/04/2024   Procedure: RIGHT CUTANEOUS URETEROSCOPY;  Surgeon: Alvaro Ricardo KATHEE Mickey., MD;  Location: [facility];  Service: Urology;  Laterality: Right;    Short Social History:  Social History   Tobacco Use   Smoking status: Never   Smokeless tobacco: Never  Substance Use Topics   Alcohol use: Not Currently    Alcohol/week: 1.0 - 2.0 standard drink of alcohol    Types: 1 - 2 Glasses of wine per week    Comment: rare    Allergies[1]  Current Outpatient Medications  Medication Sig Dispense Refill   acetaminophen  (TYLENOL ) 500 MG tablet Take 1,000 mg by mouth every 6 (six) hours as needed for moderate pain (pain score 4-6).     amLODipine  (NORVASC ) 10 MG tablet Take 1 tablet (10 mg total) by mouth daily. (Patient taking differently: Take 10 mg by mouth every evening.) 90 tablet 1   ferrous sulfate  325 (65 FE) MG tablet Take 325 mg by mouth daily with supper.     levothyroxine  (SYNTHROID , LEVOTHROID) 50 MCG tablet Take 50 mcg by mouth daily before breakfast.     lidocaine  (LIDODERM ) 5 % Place 1 patch onto the skin daily. Remove & Discard patch within 12 hours or as directed by MD 30 patch 0   oxyCODONE  (ROXICODONE ) 5 MG immediate release tablet Take 1 tablet (5 mg total) by mouth every 4 (four) hours as needed for severe pain (pain  score 7-10). 10 tablet 0   pantoprazole  (PROTONIX ) 20 MG tablet Take 20 mg by mouth at bedtime.     rosuvastatin  (CRESTOR ) 5 MG tablet Take 5 mg by mouth at bedtime.     senna-docusate (SENOKOT-S) 8.6-50 MG tablet Take 1 tablet by mouth 2 (two) times daily. While taking strong pain meds to prevent constipation 10 tablet 0   sodium bicarbonate  650 MG tablet Take 1,300 mg by mouth 2 (two) times daily.     No current facility-administered medications for this visit.    REVIEW OF SYSTEMS All  other systems were reviewed and are negative    Objective:  Objective   There were no vitals filed for this visit. There is no height or weight on file to calculate BMI.  Physical Exam General: no acute distress Cardiac: hemodynamically stable Extremities: *** Vascular:   Right: palpable brachial, radial  Left: palpable brachial, radial   Data: UE arterial duplex ***   Vein mapping ***  Note from nephrologist reviewed ***      Assessment/Plan:     [name] [name] is a 89 y.o. male with {CKDACcess:31998} who presents to discuss permanent access creation.  Dominant hand: {RIGHT/LEFT:20294} Previous access surgeries: *** Previous catheters: {RIGHT/LEFT:21944} IJ Currently dialyzing via *** on *** Other arm surgeries/injuries: ***  The vein mapping suggests there is *** a suitable *** and we discussed that they are a candidate for ***  The risks an benefits including of access creation were reviewed including: need for additional procedures, need for additional creations, steal, ischemia monomelic neuropathy, failure of access, and bleeding. The patient expressed understand and is willing to proceed.  I explained that I will perform intraoperative vein mapping to confirm the above findings and will determine the most appropriate access to create but with a preoperative plan for {RIGHT/LEFT:20294} arm ***.     Norman GORMAN Serve MD [facility]     [1]  Allergies Allergen Reactions   Macrobid [Nitrofurantoin] Shortness Of Breath   Ciprofloxacin  Diarrhea and Other (See Comments)    Occurred with 750 mg dose. Has since taken 500 mg doses and has had no reaction/diarrhea.   Keytruda  [Pembrolizumab ] Other (See Comments)    Weakness in both legs and fatigue   Cephalexin Rash and Other (See Comments)    Rash on arms; tolerated Rocephin /cefpodoxime    [name]  [Mitomycin ] Rash and Other (See Comments)    All over body RASH; possibly triggered kidney REMOVAL and RESTLESS LEGS also

## 2024-11-10 ENCOUNTER — Ambulatory Visit (HOSPITAL_COMMUNITY)
Admission: RE | Admit: 2024-11-10 | Discharge: 2024-11-10 | Disposition: A | Discharge status: Home / Self Care | Source: Ambulatory Visit | Attending: Vascular Surgery | Admitting: Vascular Surgery

## 2024-11-10 ENCOUNTER — Ambulatory Visit: Admitting: Vascular Surgery

## 2024-11-10 ENCOUNTER — Encounter: Payer: Self-pay | Admitting: Vascular Surgery

## 2024-11-10 ENCOUNTER — Ambulatory Visit (HOSPITAL_COMMUNITY): Admission: RE | Admit: 2024-11-10 | Discharge: 2024-11-10 | Attending: Vascular Surgery

## 2024-11-10 VITALS — BP 132/68 | HR 72 | Temp 98.1°F | Resp 20 | Ht 68.0 in | Wt 163.8 lb

## 2024-11-10 DIAGNOSIS — N184 Chronic kidney disease, stage 4 (severe): Secondary | ICD-10-CM

## 2024-11-13 ENCOUNTER — Other Ambulatory Visit

## 2024-11-14 NOTE — Patient Outreach (Signed)
 Complex Care Management   Visit Note  11/14/2024  Name:  [name] [name] MRN: [id number] DOB: 22-Jan-1932  Situation: Referral received for Complex Care Management related to Chronic Kidney Disease I obtained verbal consent from Patient.  Visit completed with [name] [name]  on the phone  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   BPH (benign prostatic hyperplasia)    CKD (chronic kidney disease), stage III (HCC)    STAGE 3  B CKD per lov 10-16-2020 dr mateo d[facility] on chart   Dyspnea    Fatigue    MIDDLE OF DAY ON OCCASION   Frequency of urination    GERD (gastroesophageal reflux disease)    on protonix    Hematuria    History of colon polyps    HLD (hyperlipidemia)    HOH (hard of hearing)    slightly hoh right worse than left- no HAs   HTN (hypertension)    Hypothyroidism    Incontinence of urine    weras pads all the time   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   Right Buccal Cheek   Pre-diabetes    Recurrent bladder papillary carcinoma (HCC) hx 2012 and 2014-- urologist- dr sherrilee   s/p  TURBT 08-16-2017  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   Renal mass 09/02/2022   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 10/02/2023   Sleep apnea    uses cpap set on 20 /4   Trigger finger of left hand 07/04/2021   last few months per pt   UTI (urinary tract infection) 08/20/2020   Weakness of extremity    left knee   Wears glasses     Assessment: Patient Reported Symptoms:  Cognitive Cognitive Status: Alert and oriented to person, place, and time, Insightful and able to interpret abstract concepts, Normal speech and language skills      Neurological Neurological Review of Symptoms: No  symptoms reported    HEENT HEENT Symptoms Reported: Not assessed      Cardiovascular Cardiovascular Symptoms Reported: No symptoms reported    Respiratory Respiratory Symptoms Reported: No symptoms reported    Endocrine Endocrine Symptoms Reported: Not assessed    Gastrointestinal Gastrointestinal Symptoms Reported: No symptoms reported Gastrointestinal Comment: Using Metamucil and Senna to keep bowels moving.    Genitourinary Genitourinary Symptoms Reported: Other Additional Genitourinary Details: hx of Bladder cancer, has urostomy for right kidney, left kidney absent. Denies symptoms.    Integumentary Integumentary Symptoms Reported: Itching Additional Integumentary Details: He had appt with PCP for itching, recommended Sarna or CereVe anti-itch ointments. He is using CereVe, takes a shower if the itching is keeping him up at night, uses distraction techniques such as reading and listening to music. Skin Self-Management Outcome: 3 (uncertain)  Musculoskeletal Musculoskelatal Symptoms Reviewed: Unsteady gait Additional Musculoskeletal Details: Continues to use walker as needed. Musculoskeletal Comment: Denies falls since 10/10/24.      Psychosocial Psychosocial Symptoms Reported: No symptoms reported          11/14/2024    PHQ2-9 Depression Screening   Little interest or pleasure in doing things    Feeling down, depressed, or hopeless    PHQ-2 - Total Score  Trouble falling or staying asleep, or sleeping too much    Feeling tired or having little energy    Poor appetite or overeating     Feeling bad about yourself - or that you are a failure or have let yourself or your family down    Trouble concentrating on things, such as reading the newspaper or watching television    Moving or speaking so slowly that other people could have noticed.  Or the opposite - being so fidgety or restless that you have been moving around a lot more than usual    Thoughts that you would be  better off dead, or hurting yourself in some way    PHQ2-9 Total Score    If you checked off any problems, how difficult have these problems made it for you to do your work, take care of things at home, or get along with other people    Depression Interventions/Treatment      There were no vitals filed for this visit.    MEDICATIONS: Patient denies concerns with meds, no issues with obtaining refills   Recommendation:   Specialty provider follow-up will see Nephrology to follow up on Avgraft placement and will report worsening dry/itching skin to PCP (per PCP 11/07/24 notes, if itching worsens will consider topical treatment with antihistamine). Continue Current Plan of Care  Follow Up Plan:   Telephone follow-up in 1 month  Santana Stamp BSN, CCM [place]  [facility] Population Health RN Care Manager Direct Dial: [phone]  Fax: [phone]

## 2024-11-14 NOTE — Patient Instructions (Signed)
 Visit Information  Thank you for taking time to visit with me today. Please don't hesitate to contact me if I can be of assistance to you before our next scheduled appointment.  Your next care management appointment is by telephone on Wednesday, December 31st at 1:00pm.   Please call the care guide team at [phone] if you need to cancel, schedule, or reschedule an appointment.   Please call the [place]  National Suicide Prevention Lifeline: 657-547-1388 or TTY: (574)283-5753 TTY (515)514-9056) to talk to a trained counselor if you are experiencing a Mental Health or Behavioral Health Crisis or need someone to talk to.  Santana Stamp BSN, CCM   [facility] Population Health RN Care Manager Direct Dial: [phone]  Fax: [phone]

## 2024-11-22 ENCOUNTER — Telehealth: Payer: Self-pay

## 2024-12-11 ENCOUNTER — Other Ambulatory Visit: Payer: Self-pay

## 2024-12-11 NOTE — Patient Instructions (Signed)
 Visit Information  Thank you for taking time to visit with me today. Please don't hesitate to contact me if I can be of assistance to you before our next scheduled appointment.  Your next care management appointment is by telephone on Friday, February 13th at 1:00pm.  Please call the care guide team at [phone] if you need to cancel, schedule, or reschedule an appointment.   Please call the [place]  National Suicide Prevention Lifeline: 4303975969 or TTY: 956-855-3381 TTY [phone]) to talk to a trained counselor if you are experiencing a Mental Health or Behavioral Health Crisis or need someone to talk to.  Santana Stamp BSN, CCM [place]  [facility] Population Health RN Care Manager Direct Dial: [phone]  Fax: [phone]

## 2024-12-11 NOTE — Patient Outreach (Addendum)
 Complex Care Management   Visit Note  12/11/2024  Name:  [name] [name] MRN: [id number] DOB: July 30, 1932  Situation: Referral received for Complex Care Management related to Chronic Kidney Disease I obtained verbal consent from Patient.  Visit completed with [name] [name]  on the phone. Patient is being followed by Dr. Bhandari/Nephrology with CKD stage IV, continues to have great family support, lives with his son and son's wife, continues to drive short distances.   Background:   Prepping for possible dialysis, had appt with Vascular and Vein for vein mapping 11/10/24 with recommendations for preoperative plan for left arm AVG, Vascular MD awaiting plan from Nephrology.  Past Medical History:  Diagnosis Date   Anemia    Arthritis    lower back bulging disc, hips, knees, thumbs, shoulder   Asthma    as a pre teen   Bladder cancer (HCC) UROLOGIST-  DR MCKENZIE   S/P TURBT 08-16-2017   Bladder wall hemorrhage 09/28/2023   BPH (benign prostatic hyperplasia)    CKD (chronic kidney disease), stage III (HCC)    STAGE 3  B CKD per lov 10-16-2020 dr mateo [facility] on chart   Dyspnea    Fatigue    MIDDLE OF DAY ON OCCASION   Frequency of urination    GERD (gastroesophageal reflux disease)    on protonix    Hematuria    History of colon polyps    HLD (hyperlipidemia)    HOH (hard of hearing)    slightly hoh right worse than left- no HAs   HTN (hypertension)    Hypothyroidism    Incontinence of urine    weras pads all the time   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   Right Buccal Cheek   Pre-diabetes    Recurrent bladder papillary carcinoma (HCC) hx 2012 and 2014-- urologist- dr sherrilee   s/p  TURBT 08-16-2017  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   Renal mass 09/02/2022   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 10/02/2023   Sleep apnea    uses cpap set on 20 /4   Trigger finger of left hand 07/04/2021   last few  months per pt   UTI (urinary tract infection) 08/20/2020   Weakness of extremity    left knee   Wears glasses     Assessment: Patient Reported Symptoms:  Cognitive Cognitive Status: Alert and oriented to person, place, and time, Insightful and able to interpret abstract concepts, Normal speech and language skills      Neurological Neurological Review of Symptoms: No symptoms reported    HEENT HEENT Symptoms Reported: Not assessed      Cardiovascular Cardiovascular Symptoms Reported: No symptoms reported Does patient have uncontrolled Hypertension?: No Cardiovascular Comment: Has compression socks to wear as needed. Denies swelling today.  Respiratory Respiratory Symptoms Reported: No symptoms reported    Endocrine Endocrine Symptoms Reported: Not assessed Is patient diabetic?: No    Gastrointestinal Gastrointestinal Symptoms Reported: No symptoms reported Gastrointestinal Comment: Using Metamucil    Genitourinary Genitourinary Symptoms Reported: Other Additional Genitourinary Details: hx of Bladder cancer, has urostomy for right kidney, left kidney absent. Denies symptoms.    Integumentary Integumentary Symptoms Reported: Itching Additional Integumentary Details: Itching has improved but continues to use ointments/showers to calm down the itching.    Musculoskeletal Musculoskelatal Symptoms Reviewed: Unsteady gait, Joint pain Additional Musculoskeletal Details: Continues to use walker and cane as needed, doesn't leave the house without either walking sticks or cane. Reports he  is walking greater distances, 1.3 miles recently, at least 1 mile each time. Musculoskeletal Comment: Denies falls since 10/10/24.      Psychosocial Psychosocial Symptoms Reported: No symptoms reported          12/11/2024    PHQ2-9 Depression Screening   Little interest or pleasure in doing things    Feeling down, depressed, or hopeless    PHQ-2 - Total Score    Trouble falling or staying  asleep, or sleeping too much    Feeling tired or having little energy    Poor appetite or overeating     Feeling bad about yourself - or that you are a failure or have let yourself or your family down    Trouble concentrating on things, such as reading the newspaper or watching television    Moving or speaking so slowly that other people could have noticed.  Or the opposite - being so fidgety or restless that you have been moving around a lot more than usual    Thoughts that you would be better off dead, or hurting yourself in some way    PHQ2-9 Total Score    If you checked off any problems, how difficult have these problems made it for you to do your work, take care of things at home, or get along with other people    Depression Interventions/Treatment      There were no vitals filed for this visit. Pain Scale: 0-10 Pain Score: 0-No pain  Medications Reviewed Today   Medications were not reviewed in this encounter     Recommendation:   Specialty provider follow-up : Nephrology 01/04/25 Continue Current Plan of Care Patient will continue to monitor for any adverse symptoms with kidney function.   Follow Up Plan:   Telephone follow-up in 1 month  Santana Stamp BSN, CCM [place]  [facility] Population Health RN Care Manager Direct Dial: [phone]  Fax: [phone]

## 2025-01-05 ENCOUNTER — Telehealth

## 2025-01-18 ENCOUNTER — Ambulatory Visit: Admitting: Physician Assistant

## 2025-04-03 ENCOUNTER — Inpatient Hospital Stay
